# Patient Record
Sex: Male | Born: 1950 | Race: White | Hispanic: No | Marital: Married | Smoking: Former smoker
Health system: Southern US, Community
[De-identification: ages and names within clinical notes are randomized; demographics above are authoritative.]

## PROBLEM LIST (undated history)

## (undated) DIAGNOSIS — E1143 Type 2 diabetes mellitus with diabetic autonomic (poly)neuropathy: Secondary | ICD-10-CM

## (undated) DIAGNOSIS — I255 Ischemic cardiomyopathy: Secondary | ICD-10-CM

## (undated) DIAGNOSIS — I10 Essential (primary) hypertension: Secondary | ICD-10-CM

## (undated) DIAGNOSIS — J849 Interstitial pulmonary disease, unspecified: Secondary | ICD-10-CM

## (undated) DIAGNOSIS — IMO0002 Reserved for concepts with insufficient information to code with codable children: Secondary | ICD-10-CM

## (undated) DIAGNOSIS — K5790 Diverticulosis of intestine, part unspecified, without perforation or abscess without bleeding: Secondary | ICD-10-CM

## (undated) DIAGNOSIS — F17200 Nicotine dependence, unspecified, uncomplicated: Secondary | ICD-10-CM

## (undated) DIAGNOSIS — G459 Transient cerebral ischemic attack, unspecified: Secondary | ICD-10-CM

## (undated) DIAGNOSIS — Z91199 Patient's noncompliance with other medical treatment and regimen due to unspecified reason: Secondary | ICD-10-CM

## (undated) DIAGNOSIS — K219 Gastro-esophageal reflux disease without esophagitis: Secondary | ICD-10-CM

## (undated) DIAGNOSIS — J9611 Chronic respiratory failure with hypoxia: Secondary | ICD-10-CM

## (undated) DIAGNOSIS — E119 Type 2 diabetes mellitus without complications: Secondary | ICD-10-CM

## (undated) DIAGNOSIS — M4800 Spinal stenosis, site unspecified: Secondary | ICD-10-CM

## (undated) DIAGNOSIS — M1990 Unspecified osteoarthritis, unspecified site: Secondary | ICD-10-CM

## (undated) DIAGNOSIS — I5042 Chronic combined systolic (congestive) and diastolic (congestive) heart failure: Secondary | ICD-10-CM

## (undated) DIAGNOSIS — J449 Chronic obstructive pulmonary disease, unspecified: Secondary | ICD-10-CM

## (undated) DIAGNOSIS — I2510 Atherosclerotic heart disease of native coronary artery without angina pectoris: Secondary | ICD-10-CM

## (undated) DIAGNOSIS — E785 Hyperlipidemia, unspecified: Secondary | ICD-10-CM

## (undated) DIAGNOSIS — Z9119 Patient's noncompliance with other medical treatment and regimen: Secondary | ICD-10-CM

## (undated) DIAGNOSIS — C9210 Chronic myeloid leukemia, BCR/ABL-positive, not having achieved remission: Secondary | ICD-10-CM

## (undated) HISTORY — DX: Nicotine dependence, unspecified, uncomplicated: F17.200

## (undated) HISTORY — DX: Gastro-esophageal reflux disease without esophagitis: K21.9

## (undated) HISTORY — DX: Ischemic cardiomyopathy: I25.5

## (undated) HISTORY — PX: EYE SURGERY: SHX253

## (undated) HISTORY — DX: Type 2 diabetes mellitus with diabetic autonomic (poly)neuropathy: E11.43

## (undated) HISTORY — DX: Patient's noncompliance with other medical treatment and regimen: Z91.19

## (undated) HISTORY — PX: COLONOSCOPY: SHX174

## (undated) HISTORY — PX: CORONARY ANGIOPLASTY: SHX604

## (undated) HISTORY — DX: Hyperlipidemia, unspecified: E78.5

## (undated) HISTORY — PX: BRONCHOSCOPY: SUR163

## (undated) HISTORY — DX: Diverticulosis of intestine, part unspecified, without perforation or abscess without bleeding: K57.90

## (undated) HISTORY — PX: UPPER GI ENDOSCOPY: SHX6162

## (undated) HISTORY — DX: Chronic obstructive pulmonary disease, unspecified: J44.9

## (undated) HISTORY — DX: Essential (primary) hypertension: I10

## (undated) HISTORY — DX: Patient's noncompliance with other medical treatment and regimen due to unspecified reason: Z91.199

## (undated) HISTORY — DX: Reserved for concepts with insufficient information to code with codable children: IMO0002

## (undated) HISTORY — DX: Chronic myeloid leukemia, BCR/ABL-positive, not having achieved remission: C92.10

## (undated) HISTORY — PX: CORONARY ARTERY BYPASS GRAFT: SHX141

## (undated) HISTORY — DX: Atherosclerotic heart disease of native coronary artery without angina pectoris: I25.10

## (undated) HISTORY — DX: Transient cerebral ischemic attack, unspecified: G45.9

## (undated) HISTORY — DX: Chronic respiratory failure with hypoxia: J96.11

## (undated) HISTORY — PX: BACK SURGERY: SHX140

## (undated) HISTORY — DX: Interstitial pulmonary disease, unspecified: J84.9

---

## 1998-06-10 HISTORY — PX: CARDIAC CATHETERIZATION: SHX172

## 2000-08-10 DIAGNOSIS — K5790 Diverticulosis of intestine, part unspecified, without perforation or abscess without bleeding: Secondary | ICD-10-CM

## 2000-08-10 HISTORY — DX: Diverticulosis of intestine, part unspecified, without perforation or abscess without bleeding: K57.90

## 2002-08-10 HISTORY — PX: OTHER SURGICAL HISTORY: SHX169

## 2003-06-06 ENCOUNTER — Encounter: Payer: Self-pay | Admitting: Family Medicine

## 2003-06-15 ENCOUNTER — Encounter: Payer: Self-pay | Admitting: Family Medicine

## 2007-02-15 ENCOUNTER — Ambulatory Visit: Payer: Self-pay | Admitting: Cardiology

## 2007-02-18 ENCOUNTER — Ambulatory Visit: Payer: Self-pay

## 2007-03-04 ENCOUNTER — Ambulatory Visit: Payer: Self-pay | Admitting: Cardiology

## 2007-06-14 ENCOUNTER — Encounter: Payer: Self-pay | Admitting: Cardiology

## 2007-06-14 ENCOUNTER — Ambulatory Visit: Payer: Self-pay

## 2007-06-14 LAB — CONVERTED CEMR LAB
BUN: 12 mg/dL (ref 6–23)
Bilirubin, Direct: <0.1 mg/dL (ref 0.0–0.3)
CO2: 24 meq/L (ref 19–32)
Chloride: 106 meq/L (ref 96–112)
Creatinine, Ser: 0.97 mg/dL (ref 0.40–1.50)
Glucose, Bld: 102 mg/dL — ABNORMAL HIGH (ref 70–99)
LDL Cholesterol: 49 mg/dL (ref 0–99)
Potassium: 4.1 meq/L (ref 3.5–5.3)
Total Bilirubin: 0.3 mg/dL (ref 0.3–1.2)
Total Protein: 7.7 g/dL (ref 6.0–8.3)
Triglycerides: 243 mg/dL — ABNORMAL HIGH (ref <150)
VLDL: 49 mg/dL — ABNORMAL HIGH (ref 0–40)

## 2007-07-04 ENCOUNTER — Ambulatory Visit: Payer: Self-pay | Admitting: Gastroenterology

## 2008-12-19 DIAGNOSIS — E785 Hyperlipidemia, unspecified: Secondary | ICD-10-CM | POA: Insufficient documentation

## 2008-12-19 DIAGNOSIS — I10 Essential (primary) hypertension: Secondary | ICD-10-CM | POA: Insufficient documentation

## 2008-12-21 ENCOUNTER — Ambulatory Visit: Payer: Self-pay | Admitting: Cardiology

## 2008-12-21 ENCOUNTER — Encounter: Payer: Self-pay | Admitting: Cardiology

## 2008-12-21 LAB — CONVERTED CEMR LAB
AST: 27 units/L (ref 0–37)
Albumin: 4.5 g/dL (ref 3.5–5.2)
Alkaline Phosphatase: 41 units/L (ref 39–117)
Calcium: 9.5 mg/dL (ref 8.4–10.5)
Chloride: 104 meq/L (ref 96–112)
Glucose, Bld: 107 mg/dL — ABNORMAL HIGH (ref 70–99)
LDL Cholesterol: 62 mg/dL (ref 0–99)
Potassium: 4.2 meq/L (ref 3.5–5.3)
Sodium: 140 meq/L (ref 135–145)
Total Protein: 7.9 g/dL (ref 6.0–8.3)
Triglycerides: 169 mg/dL — ABNORMAL HIGH (ref <150)

## 2008-12-25 ENCOUNTER — Telehealth: Payer: Self-pay | Admitting: Cardiology

## 2008-12-27 ENCOUNTER — Encounter: Payer: Self-pay | Admitting: Cardiology

## 2008-12-27 ENCOUNTER — Ambulatory Visit: Payer: Self-pay

## 2009-04-17 ENCOUNTER — Ambulatory Visit: Payer: Self-pay | Admitting: Family Medicine

## 2009-04-17 DIAGNOSIS — R209 Unspecified disturbances of skin sensation: Secondary | ICD-10-CM | POA: Insufficient documentation

## 2009-04-17 DIAGNOSIS — Z8679 Personal history of other diseases of the circulatory system: Secondary | ICD-10-CM | POA: Insufficient documentation

## 2009-04-17 DIAGNOSIS — Z87891 Personal history of nicotine dependence: Secondary | ICD-10-CM | POA: Insufficient documentation

## 2009-04-18 LAB — CONVERTED CEMR LAB: Creatinine, Ser: 0.8 mg/dL (ref 0.4–1.5)

## 2009-04-21 ENCOUNTER — Encounter: Admission: RE | Admit: 2009-04-21 | Discharge: 2009-04-21 | Payer: Self-pay | Admitting: Family Medicine

## 2009-04-22 ENCOUNTER — Telehealth: Payer: Self-pay | Admitting: Family Medicine

## 2009-05-07 ENCOUNTER — Telehealth (INDEPENDENT_AMBULATORY_CARE_PROVIDER_SITE_OTHER): Payer: Self-pay | Admitting: *Deleted

## 2009-05-23 ENCOUNTER — Encounter: Payer: Self-pay | Admitting: Family Medicine

## 2009-10-24 ENCOUNTER — Ambulatory Visit: Payer: Self-pay | Admitting: Family Medicine

## 2009-11-18 ENCOUNTER — Ambulatory Visit: Payer: Self-pay | Admitting: Family Medicine

## 2009-11-18 DIAGNOSIS — R5383 Other fatigue: Secondary | ICD-10-CM

## 2009-11-18 DIAGNOSIS — R5381 Other malaise: Secondary | ICD-10-CM

## 2009-11-18 LAB — CONVERTED CEMR LAB
AST: 33 units/L (ref 0–37)
Alkaline Phosphatase: 45 units/L (ref 39–117)
Basophils Absolute: 0 10*3/uL (ref 0.0–0.1)
Basophils Relative: 1 % (ref 0.0–3.0)
GFR calc non Af Amer: 105.56 mL/min (ref >60)
Hemoglobin: 14.8 g/dL (ref 13.0–17.0)
Lymphocytes Relative: 46.8 % — ABNORMAL HIGH (ref 12.0–46.0)
Monocytes Relative: 11.9 % (ref 3.0–12.0)
Neutro Abs: 1.9 10*3/uL (ref 1.4–7.7)
Potassium: 3.8 meq/L (ref 3.5–5.1)
RBC: 4.78 M/uL (ref 4.22–5.81)
Sodium: 142 meq/L (ref 135–145)
Total Bilirubin: 0.3 mg/dL (ref 0.3–1.2)
Total CHOL/HDL Ratio: 4
VLDL: 68.8 mg/dL — ABNORMAL HIGH (ref 0.0–40.0)
WBC: 4.8 10*3/uL (ref 4.5–10.5)

## 2009-11-25 ENCOUNTER — Ambulatory Visit: Payer: Self-pay | Admitting: Family Medicine

## 2009-11-26 ENCOUNTER — Encounter: Payer: Self-pay | Admitting: Family Medicine

## 2009-11-26 LAB — CONVERTED CEMR LAB
Glucose, Bld: 166 mg/dL — ABNORMAL HIGH (ref 70–99)
Hgb A1c MFr Bld: 6.7 % — ABNORMAL HIGH (ref 4.6–6.5)

## 2010-01-02 ENCOUNTER — Ambulatory Visit: Payer: Self-pay | Admitting: Family Medicine

## 2010-01-02 DIAGNOSIS — E1159 Type 2 diabetes mellitus with other circulatory complications: Secondary | ICD-10-CM | POA: Insufficient documentation

## 2010-01-07 LAB — CONVERTED CEMR LAB: Vitamin B-12: 357 pg/mL (ref 211–911)

## 2010-03-26 ENCOUNTER — Encounter: Payer: Self-pay | Admitting: Family Medicine

## 2010-05-05 ENCOUNTER — Emergency Department: Payer: Self-pay | Admitting: Emergency Medicine

## 2010-07-16 ENCOUNTER — Encounter: Payer: Self-pay | Admitting: Family Medicine

## 2010-07-16 ENCOUNTER — Encounter (INDEPENDENT_AMBULATORY_CARE_PROVIDER_SITE_OTHER): Payer: Self-pay

## 2010-07-16 ENCOUNTER — Ambulatory Visit: Payer: Self-pay | Admitting: Family Medicine

## 2010-07-16 LAB — CONVERTED CEMR LAB: Hgb A1c MFr Bld: 6 %

## 2010-07-31 ENCOUNTER — Ambulatory Visit: Payer: Self-pay | Admitting: Family Medicine

## 2010-07-31 DIAGNOSIS — J309 Allergic rhinitis, unspecified: Secondary | ICD-10-CM | POA: Insufficient documentation

## 2010-09-10 NOTE — Assessment & Plan Note (Signed)
Summary: ROA FOR PAIN IN WRIST UP TO ELBOW   Vital Signs:  Patient profile:   60 year old male Height:      72 inches Weight:      215.6 pounds BMI:     29.35 Temp:     98.4 degrees F oral Pulse rate:   88 / minute Pulse rhythm:   regular BP sitting:   150 / 92  (left arm) Cuff size:   regular  Vitals Entered By: Benny Lennert CMA Duncan Dull) (October 24, 2009 11:50 AM)  History of Present Illness: Chief complaint right wrist hurting up to elbow also finger on the left hand bothering him  60 year old male:  HTN: BP elevated, h/o MI, not at goal.   DeQ: Right wrist hurts. Has not done anything new. Been hurting the last few weeks, has tried to wrap this as work. No trauma.   Left finger hurts some.    Right - DeQ  thumb spica  Allergies (verified): No Known Drug Allergies  Past History:  Past medical, surgical, family and social histories (including risk factors) reviewed, and no changes noted (except as noted below).  Past Medical History: Reviewed history from 04/17/2009 and no changes required. 1.  CAD:  Inferior MI 11/99 (in [place]) with PCI to [facility].  Last LHC 2004 [facility]): EF 50%, patent RCA stent.  No obstructive disease.  Last Myoview 2008: EF 42%, inferior infarct, no ischemia. 2.  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  3.  H/o TIAs, now s/p PFO closure 2004 (in [place]). 4.  HTN 5.  Hyperlipidemia 6.  Smoker 7. Diverticulitis, 2002 8. GERD 9.Ulcer 10. Headaches 11. Medical non-compliance  Past Surgical History: Reviewed history from 04/17/2009 and no changes required. Open heart surgery, PFO repair, 2004 Cath, PCI per PMH, 06/1998  Family History: Reviewed history from 04/17/2009 and no changes required. Family History of Coronary Artery Disease:  Family History Breast cancer 1st degree relative <50  Social History: Reviewed history from 12/21/2008 and no changes required. Married, 1 daughter, moved from [place].   Tobacco Use - Yes, 1ppd.    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  Lives in [place].   Review of Systems       REVIEW OF SYSTEMS  GEN: No systemic complaints, no fevers, chills, sweats, or other acute illnesses MSK: Detailed in the HPI GI: tolerating PO intake without difficulty Neuro: No numbness, parasthesias, or tingling associated. Otherwise the pertinent positives of the ROS are noted above.    Physical Exam  General:  GEN: Well-developed,well-nourished,in no acute distress; alert,appropriate and cooperative throughout examination HEENT: Normocephalic and atraumatic without obvious abnormalities. No apparent alopecia or balding. Ears, externally no deformities PULM: Breathing comfortably in no respiratory distress EXT: No clubbing, cyanosis, or edema PSYCH: Normally interactive. Cooperative during the interview. Pleasant. Friendly and conversant. Not anxious or depressed appearing. Normal, full affect.  Msk:  B hands ROM wrist/hand/digits/elbow: some B loss of motion at the wrist Carpals, MCP's, digits: NT Distal Ulna and Radius: NT Supination lift test: neg Ecchymosis or edema: SWOLLEN FIRST DORSAL COMPARTMENT Cysts/nodules: neg Finkelstein's test: POSITIVE ON THE RIGHT Snuffbox tenderness: neg Scaphoid tubercle: NT Hook of Hamate: NT Resisted supination: NT Full composite fist Grip, all digits: 5/5 str Axial load test: neg Tinel's: neg Atrophy: neg  Hand sensation: intact    Impression & Recommendations:  Problem # 1:  DE QUERVAIN'S TENOSYNOVITIS (ICD-727.04)  Assessment New >25 minutes spent in face to face time with patient, >50% spent in counselling or coordination of care: reviewed anatomy, hand rehab, grip str, rubber band resistance  place in thumb spica splint when using hand and at work  Problem # 2:  HYPERTENSION, UNSPECIFIED (ICD-401.9) Assessment: Deteriorated increase ARB  His updated medication list for this problem  includes:    Carvedilol 6.25 Mg Tabs (Carvedilol) .Marland Kitchen... 1 by mouth twice daily    Losartan Potassium 50 Mg Tabs (Losartan potassium) .Marland Kitchen... 1 by mouth daily  Complete Medication List: 1)  Lipitor 10 Mg Tabs (Atorvastatin calcium) .... Take one tablet by mouth daily. 2)  Aspirin 81 Mg Tbec (Aspirin) .... Take one tablet by mouth daily 3)  Fish Oil Oil (Fish oil) .Marland Kitchen.. 1 capsule daily 4)  Carvedilol 6.25 Mg Tabs (Carvedilol) .Marland Kitchen.. 1 by mouth twice daily 5)  Losartan Potassium 50 Mg Tabs (Losartan potassium) .Marland Kitchen.. 1 by mouth daily 6)  Bupropion Hcl 300 Mg Xr24h-tab (Bupropion hcl) .Marland Kitchen.. 1 by mouth daily 7)  Cialis 20 Mg Tabs (Tadalafil) .Marland Kitchen.. 1 by mouth 30 minutes prior to intercourse  Patient Instructions: 1)  Prephysical Labs, several days before, fasting 2)  BMP, HFP, FLP, CBC with diff, TSH: v77.91, v77.1, ,780.79 3)  CPX at his convenience Prescriptions: CIALIS 20 MG TABS (TADALAFIL) 1 by mouth 30 minutes prior to intercourse  #10 x 11   Entered and Authorized by:   Hannah Beat MD   Signed by:   Hannah Beat MD on 10/24/2009   Method used:   Print then Give to Patient   RxID:   [id number] JYNWGNFA POTASSIUM 50 MG TABS (LOSARTAN POTASSIUM) 1 by mouth daily  #30 x 11   Entered and Authorized by:   Hannah Beat MD   Signed by:   Hannah Beat MD on 10/24/2009   Method used:   Print then Give to Patient   RxID:   [id number]   Current Allergies (reviewed today): No known allergies

## 2010-09-10 NOTE — Miscellaneous (Signed)
  Clinical Lists Changes  Medications: Added new medication of GLUCOPHAGE XR 500 MG XR24H-TAB (METFORMIN HCL) take on by mouth daily - Signed Rx of GLUCOPHAGE XR 500 MG XR24H-TAB (METFORMIN HCL) take on by mouth daily;  #30 x 11;  Signed;  Entered by: Benny Lennert CMA (AAMA);  Authorized by: Hannah Beat MD;  Method used: [name] [address] [place], [place]  [address], Ph: [phone] or [phone], Fax: [phone]    Prescriptions: GLUCOPHAGE XR 500 MG XR24H-TAB (METFORMIN HCL) take on by mouth daily  #30 x 11   Entered by:   Benny Lennert CMA (AAMA)   Authorized by:   Hannah Beat MD   Signed by:   Benny Lennert CMA (AAMA) on 11/26/2009   Method used:   [facility] to        [name] ([facility]ail[facility]       [address]       [place], [place]  [address]       Ph: [phone] or [phone]       Fax: [phone]   RxID:   [id number]   Prior Medications: LIPITOR 10 MG TABS (ATORVASTATIN CALCIUM) Take one tablet by mouth daily. ASPIRIN 81 MG TBEC (ASPIRIN) Take one tablet by mouth daily FISH OIL   OIL (FISH OIL) 1 capsule daily LOSARTAN POTASSIUM 50 MG TABS (LOSARTAN POTASSIUM) 1 by mouth daily BUPROPION HCL 300 MG XR24H-TAB (BUPROPION HCL) 1 by mouth daily CIALIS 20 MG TABS (TADALAFIL) 1 by mouth 30 minutes prior to intercourse Current Allergies: No known allergies

## 2010-09-10 NOTE — Letter (Signed)
Summary: Supplies   Supplies   Imported By: Dorna Leitz 07/17/2010 15:32:56  _____________________________________________________________________  External Attachment:    Type:   Image     Comment:   External Document

## 2010-09-10 NOTE — Assessment & Plan Note (Signed)
Summary: POSSIBLE BRONCHITIS AND CONGESTION IN CHEST/EVM   Vital Signs:  Patient Profile:   60 Years Old Male CC:      Cold & URI symptoms Height:     71.25 inches Weight:      197 pounds BMI:     27.38 O2 Sat:      92 % O2 treatment:    Room Air Temp:     97.7 degrees F oral Pulse rate:   92 / minute Pulse rhythm:   regular Resp:     20 per minute BP sitting:   159 / 85  (right arm)  Pt. in pain?   no  Vitals Entered By: Levonne Spiller EMT-P (July 16, 2010 3:44 PM)              Is Patient Diabetic? Yes   Does patient need assistance? Functional Status Self care Ambulation Normal Comments Pt. is a smoker. 1 pack per day.     Serial Vital Signs/Assessments:                                PEF    PreRx  PostRx Time      O2 Sat  O2 Type     L/min  L/min  L/min   By 1605      96  %   Room air                          Clanford Johnson MD   Current Allergies: No known allergies History of Present Illness History from: patient Chief Complaint: Cold & URI symptoms History of Present Illness: Pt came in today for evaluation of cough and wheezing and congestion that has gotten worse over the past 2 days.  He came in because he was having fatigue and SOB and not resting well at night because of coughing.  He has been feeling really bad for the last several weeks according to his wife.  He is a smoker and has been smoking for 40 years.  He has tried to quit multiple times but ended up starting back.  He has been weak.  No chest pain reported.  He has been wheezing and having tight lungs that have been hard to hold any air.  He has not been vomiting.  He did get his flu vaccine and does report that he has had his pneumovax in the last 5 years.  In addition, his wife reports that he has had severe COPD exacerbations in the past.  They used to live in [place].  They now live in [place].   The patient does not have a home nebulizer machine.    REVIEW OF SYSTEMS Constitutional Symptoms   Complains of fatigue.     Denies fever, chills, night sweats, weight loss, and weight gain.  Eyes       Complains of glasses.      Denies change in vision, eye pain, eye discharge, contact lenses, and eye surgery. Ear/Nose/Throat/Mouth       Complains of frequent runny nose, sinus problems, sore throat, and hoarseness.      Denies hearing loss/aids, change in hearing, ear pain, ear discharge, dizziness, frequent nose bleeds, and tooth pain or bleeding.  Respiratory       Complains of productive cough, wheezing, shortness of breath, and bronchitis.      Denies dry cough, asthma, and emphysema/COPD.  Comments: Colored Sputum Cardiovascular       Complains of tires easily with exhertion.      Denies murmurs and chest pain.    Gastrointestinal       Denies stomach pain, nausea/vomiting, diarrhea, constipation, blood in bowel movements, and indigestion. Genitourniary       Denies painful urination, kidney stones, and loss of urinary control. Neurological       Denies paralysis, seizures, and fainting/blackouts. Musculoskeletal       Denies muscle pain, joint pain, joint stiffness, decreased range of motion, redness, swelling, muscle weakness, and gout.  Skin       Denies bruising, unusual mles/lumps or sores, and hair/skin or nail changes.  Psych       Denies mood changes, temper/anger issues, anxiety/stress, speech problems, depression, and sleep problems.  Past History:  Past Medical History: 1.  CAD:  Inferior MI 11/99 (in [place]) with PCI to [facility].  Last LHC 2004 [facility]): EF 50%, patent RCA stent.  No obstructive disease.  Last Myoview 2008: EF 42%, inferior infarct, no ischemia. 2.  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  3.  H/o TIAs, now s/p PFO closure 2004 (in [place]). diabetes mellitus, type 2 - A1c 6.0% 07/2010 4.  HTN 5.  Hyperlipidemia 6.  Smoker 7. Diverticulitis, 2002 8. GERD 9.Ulcer 10. Headaches 11. Medical non-compliance 12. Chronic Active Nicotine  Dependence 13. COPD  Social History: Married, 1 daughter, relocated to [place] from [place].   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  Lives in [place], [place].  Physical Exam General appearance: well developed, well nourished, no acute distress Head: normocephalic, atraumatic Eyes: conjunctivae and lids normal Pupils: equal, round, reactive to light Ears: normal, no lesions or deformities Nasal: swollen red turbinates with congestion Oral/Pharynx: pharyngeal erythema without exudate, uvula midline without deviation Neck: neck supple,  trachea midline, no masses Chest/Lungs: scattered wheezes throughout all lobes, poor air movement, shallow breaths, no accessory muscle use seen Heart: regular rate and  rhythm, no murmur Abdomen: soft, non-tender without obvious organomegaly Extremities: normal extremities Neurological: grossly intact and non-focal Skin: no obvious rashes or lesions MSE: oriented to time, place, and person Assessment Problems:   DIABETES MELLITUS, TYPE II (ICD-250.00) HEALTH MAINTENANCE EXAM (ICD-V70.0) SPECIAL SCREENING MALIGNANT NEOPLASM OF PROSTATE (ICD-V76.44) OTHER MALAISE AND FATIGUE (ICD-780.79) TOBACCO USE (ICD-305.1) SPECIAL SCREENING MALIGNANT NEOPLASM OF PROSTATE (ICD-V76.44) TRANSIENT ISCHEMIC ATTACKS, HX OF (ICD-V12.50) PARESTHESIA (ICD-782.0) FAMILY HISTORY BREAST CANCER 1ST DEGREE RELATIVE <50 (ICD-V16.3) HYPERTENSION, UNSPECIFIED (ICD-401.9) HYPERLIPIDEMIA-MIXED (ICD-272.4) CAD, UNSPECIFIED SITE (ICD-414.00)  Assessed TOBACCO USE as unchanged - Clanford Johnson MD Assessed DIABETES MELLITUS, TYPE II as improved - Clanford Johnson MD Assessed OTHER MALAISE AND FATIGUE as deteriorated - Clanford Johnson MD Assessed OBSTRUCTIVE CHRONIC BRONCHITIS WITH EXACERBATION as new - Clanford Johnson MD Assessed CAD, UNSPECIFIED SITE as unchanged - Clanford Johnson MD New  Problems: OBSTRUCTIVE CHRONIC BRONCHITIS WITH EXACERBATION (ICD-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 The risks, benefits and possible side effects were clearly explained and discussed with the patient.  The patient verbalized clear understanding.  The patient was given instructions to return if symptoms don't improve, worsen or new changes develop.  If it is not during clinic hours and the patient cannot get back to this clinic then the patient was told to seek medical care at an available urgent  care or emergency department.  The patient verbalized understanding.   Demonstrates willingness to comply.  Plan New Medications/Changes: IPRATROPIUM-ALBUTEROL 0.5-2.5 (3) MG/3ML SOLN (IPRATROPIUM-ALBUTEROL) use 1 vial every 4 hours as needed shortness of breath and wheezing and cough  #20 x 0, 07/16/2010, Clanford Johnson MD PREDNISONE 10 MG TABS (PREDNISONE) take 3 tabs by mouth on day 1 and 3 tabs by mouth on day 2, then 2 tabs by mouth daily for 3 days  #12 x 0, 07/16/2010, Clanford Johnson MD VENTOLIN HFA 108 (90 BASE) MCG/ACT AERS (ALBUTEROL SULFATE) 2 puffs every 3 hours as needed shortness of breath and wheezing  #1 x 1, 07/16/2010, Clanford Johnson MD DOXYCYCLINE HYCLATE 100 MG TABS (DOXYCYCLINE HYCLATE) take 1 by mouth two times a day with food  #20 x 0, 07/16/2010, [name] MD  Planning Comments:   The patient was counseled and advised to stop using all tobacco products.  Medical assistance was offered and the patient was encouraged to call 1-800-QUIT-NOW to get a smoking cessation coach.    Follow Up: Follow up in 1-2 days if no improvement, Follow up on an as needed basis, Follow up with Primary Physician  The patient and/or caregiver has been counseled thoroughly with regard to medications prescribed including dosage, schedule, interactions, rationale for use, and possible side effects and they verbalize  understanding.  Diagnoses and expected course of recovery discussed and will return if not improved as expected or if the condition worsens. Patient and/or caregiver verbalized understanding.  Prescriptions: IPRATROPIUM-ALBUTEROL 0.5-2.5 (3) MG/3ML SOLN (IPRATROPIUM-ALBUTEROL) use 1 vial every 4 hours as needed shortness of breath and wheezing and cough  #20 x 0   Entered and Authorized by:   [name] MD   Signed by:   [name] MD on 07/16/2010   Method used:   [name]       [address]       [place], [place]  [address]       Ph: [phone] or [phone]       Fax: [phone]   RxID:   [id number] PREDNISONE 10 MG TABS (PREDNISONE) take 3 tabs by mouth on day 1 and 3 tabs by mouth on day 2, then 2 tabs by mouth daily for 3 days  #12 x 0   Entered and Authorized by:   [name] MD   Signed by:   [name] MD on 07/16/2010   Method used:   [facility]        [name]       [address]       [place], [place]  [address]       Ph: [phone] or [phone]       Fax: [phone]   RxID:   [id number] VENTOLIN HFA 108 (90 BASE) MCG/ACT AERS (ALBUTEROL SULFATE) 2 puffs every 3 hours as needed shortness of breath and wheezing  #1 x 1   Entered and Authorized by:   [name] MD   Signed by:   [name] MD on 07/16/2010   Method used:   [facility]        [name]       [name]  [place], [place]  [address]       Ph: [phone] or [phone]       Fax: [phone]   RxID:   [id number] DOXYCYCLINE HYCLATE 100 MG TABS (DOXYCYCLINE HYCLATE) take 1 by mouth two times a day with food  #20 x 0   Entered and Authorized by:   [name] MD   Signed by:   [name] MD on 07/16/2010   Method used:   [facility]        [name]       [address]       [place], [place]  [address]       Ph: [phone] or [phone]       Fax: [phone]   RxID:   [id number]   Patient Instructions: 1)  Go to the pharmacy to pick up your prescriptions.  2)  Tobacco is very bad for your health and your loved ones! You Should stop smoking!. 3)  Stop Smoking Tips: Choose a Quit date. Cut down before the Quit date. decide what you will do as a substitute when you feel the urge to smoke(gum,toothpick,exercise). 4)  Take your antibiotic as prescribed until ALL of it is gone, but stop if you develop a rash or swelling and contact our office as soon as possible. 5)  Acute bronchitis symptoms for less than 10 days are not helped by antibiotics. take over the counter cough medications. call if no improvment in  5-7 days, sooner if increasing cough, fever, or new symptoms( shortness of breath, chest pain). 6)  The patient was informed that there is no on-call provider or services available at this clinic during off-hours (when the clinic is closed).  If the patient developed a problem or concern that required immediate attention, the patient was advised to go the the nearest available urgent care or emergency department for medical care.  The patient verbalized understanding.    7)  Check your blood sugars more frequently because you are now on steroids and expect that your blood sugars will be higher while on the steroids.  Please call  us and return if your blood sugars are higher than 250.   I ended up calling the prescriptions in to the [facility] pharmacy because the patient requested to get them here. [facility] called [facility] to let them know to cancel those prescriptions sent to their facility.  I spent more than 65 mins with patient.  I wanted to make sure that his respiratory status was stable and improving.  He received 2 nebulizer treatments with duoneb.  Also, I called [facility] and  arranged for the patient to receive a home nebulizer machine and medications to take a nebulizer treatment as needed for severe wheezing and SOB.   The patient was counseled and advised to stop using all tobacco products.  Medical assistance was offered and the patient was encouraged to call 1-800-QUIT-NOW to get a smoking cessation coach.    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)    Route: IM    Site: RUOQ gluteus    Exp Date: 09/09/2012    Lot #: [id number] Harman    Mfr: [email or web address]    Patient tolerated injection without complications  Given by: Levonne Spiller EMT-P (July 16, 2010 5:06 PM)  Medication # 1:    Medication: Albuterol Sulfate 3.0mg     Diagnosis: OBSTRUCTIVE CHRONIC BRONCHITIS WITH EXACERBATION (ICD-491.21)    Dose: 2    Route: Nebulizer    Exp Date: 12/08/2010    Lot #: [id number]    Mfr: SANDOZ    Patient tolerated medication without complications    Given by: Levonne Spiller EMT-P (July 16, 2010 5:10 PM)  Medication # 2:    Medication: Ipratropium Bromide 0.5mg     Diagnosis: OBSTRUCTIVE CHRONIC BRONCHITIS WITH EXACERBATION (ICD-491.21)    Dose: 2    Route: Nebulizer    Exp Date: 12/08/2010    Lot #: [id number]    Mfr: [email or web address]    Patient tolerated medication without complications    Given by: Levonne Spiller EMT-P (July 16, 2010 5:11 PM)    Lab Results    Glu:     117  mg/dL     Hgb V4U:     6.0   %

## 2010-09-10 NOTE — Letter (Signed)
Summary: Generic Letter  [facility]  [address]   [place], [place] [address]   Phone: [phone]  Fax: [phone]    07/16/2010  Tony Watson [address] [place], [place] [address]   Nebulizer treatment kit with all supplies. Diagnosis COPD 496, 491.21, 305.1          Sincerely,   Dr. [name], MD

## 2010-09-10 NOTE — Assessment & Plan Note (Signed)
Summary: F/U DIABETES/CLE   Vital Signs:  Patient profile:   60 year old male Height:      72 inches Weight:      207.0 pounds BMI:     28.18 Temp:     98.0 degrees F oral Pulse rate:   88 / minute Pulse rhythm:   regular BP sitting:   120 / 60  (left arm) Cuff size:   regular  Vitals Entered By: Benny Lennert CMA Duncan Dull) (Jan 02, 2010 11:46 AM)  History of Present Illness: Chief complaint follow up diabetes  DM: new onset: a1c = 6.7 the patient is generally not been feeling bad at all, however he has had elevated sugars. He is tolerating his metformin, one tablet of the extended release daily. He is here with his wife, and they have been reading about diabetes.  HTN, well-controlled, compliant. Status post MI  Lipids, compliant, status post MI  pneumovax - had a pneumoniavaccine      Diabetes Management History:      The patient is a 60 years old male who comes in for evaluation of DM Type 2.  He has not been enrolled in the "Diabetic Education Program".  He states lack of understanding of dietary principles and is not following his diet appropriately.  He says that he is not exercising regularly.        Hypoglycemic symptoms are not occurring.  No hyperglycemic symptoms are reported.        Symptoms which suggest diabetic complications include sexual dysfunction and paresthesias.  The following changes have been made to his treatment plan since last visit: diet changes and medication changes.  Treatment plan changes were initiated by patient and initiated by MD.    Current Problems (verified): 1)  Diabetes Mellitus, Type II  (ICD-250.00) 2)  Health Maintenance Exam  (ICD-V70.0) 3)  Special Screening Malignant Neoplasm of Prostate  (ICD-V76.44) 4)  Other Malaise and Fatigue  (ICD-780.79) 5)  Tobacco Use  (ICD-305.1) 6)  Special Screening Malignant Neoplasm of Prostate  (ICD-V76.44) 7)  Transient Ischemic Attacks, Hx of  (ICD-V12.50) 8)  Paresthesia  (ICD-782.0) 9)   Family History Breast Cancer 1st Degree Relative <50  (ICD-V16.3) 10)  Hypertension, Unspecified  (ICD-401.9) 11)  Hyperlipidemia-mixed  (ICD-272.4) 12)  Cad, Unspecified Site  (ICD-414.00)  Allergies (verified): No Known Drug Allergies  Past History:  Past medical, surgical, family and social histories (including risk factors) reviewed, and no changes noted (except as noted below).  Past Medical History: 1.  CAD:  Inferior MI 11/99 (in [place]) with PCI to [facility].  Last LHC 2004 [facility]): EF 50%, patent RCA stent.  No obstructive disease.  Last Myoview 2008: EF 42%, inferior infarct, no ischemia. 2.  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  3.  H/o TIAs, now s/p PFO closure 2004 (in [place]). diabetes mellitus, type II 4.  HTN 5.  Hyperlipidemia 6.  Smoker 7. Diverticulitis, 2002 8. GERD 9.Ulcer 10. Headaches 11. Medical non-compliance  Past Surgical History: Reviewed history from 04/17/2009 and no changes required. Open heart surgery, PFO repair, 2004 Cath, PCI per PMH, 06/1998  Family History: Reviewed history from 04/17/2009 and no changes required. Family History of Coronary Artery Disease:  Family History Breast cancer 1st degree relative <50  Social History: Reviewed history from 12/21/2008 and no changes required. Married, 1 daughter, moved from [place].   Tobacco Use - Yes, 1ppd.  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  Lives in [place].   Review of Systems       no fever, chills, sweats. No nausea, vomiting. No blurred vision. He does still have some complaints of some occasional paresthesias in multiple regions.  Neurology notes reviewed  Physical Exam  General:  GEN: WDWN, NAD, Non-toxic, A & O x 3 HEENT: Atraumatic, Normocephalic. Neck supple. No masses, No LAD. Ears and Nose: No external deformity. CV: RRR, No M/G/R. No JVD. No thrill. No extra heart sounds. PULM: CTA B, no wheezes, crackles, rhonchi. No  retractions. No resp. distress. No accessory muscle use. ABD: S, NT, ND, +BS. No rebound tenderness. No HSM.  EXTR: No c/c/e NEURO: Normal gait.  PSYCH: Normally interactive. Conversant. Not depressed or anxious appearing.  Calm demeanor.   Msk:  Phalen's and Tinel's signs are negative bilateral wrists and   Impression & Recommendations:  Problem # 1:  DIABETES MELLITUS, TYPE II (ICD-250.00) Assessment New diabetic education, continue his Glucophage  His updated medication list for this problem includes:    Aspirin 81 Mg Tbec (Aspirin) .Marland Kitchen... Take one tablet by mouth daily    Losartan Potassium 50 Mg Tabs (Losartan potassium) .Marland Kitchen... 1 by mouth daily    Glucophage Xr 500 Mg Xr24h-tab (Metformin hcl) .Marland Kitchen... Take on by mouth daily  Orders: Diabetic Clinic Referral (Diabetic)  Labs Reviewed: Creat: 0.8 (11/18/2009)    Reviewed HgBA1c results: 6.7 (11/25/2009)  6.2 (06/14/2007)  Problem # 2:  HYPERTENSION, UNSPECIFIED (ICD-401.9) Assessment: Improved  His updated medication list for this problem includes:    Carvedilol 12.5 Mg Tabs (Carvedilol) .Marland Kitchen... 1 pill by mouth 2 times daily    Losartan Potassium 50 Mg Tabs (Losartan potassium) .Marland Kitchen... 1 by mouth daily  Problem # 3:  HYPERLIPIDEMIA-MIXED (ICD-272.4) Assessment: Improved  His updated medication list for this problem includes:    Lipitor 10 Mg Tabs (Atorvastatin calcium) .Marland Kitchen... Take one tablet by mouth daily.  Labs Reviewed: SGOT: 33 (11/18/2009)   SGPT: 40 (11/18/2009)   HDL:26.70 (11/18/2009), 28 (12/21/2008)  LDL:62 (12/21/2008), 49 (06/14/2007)  Chol:114 (11/18/2009), 124 (12/21/2008)  Trig:344.0 (11/18/2009), 169 (12/21/2008)  Complete Medication List: 1)  Lipitor 10 Mg Tabs (Atorvastatin calcium) .... Take one tablet by mouth daily. 2)  Aspirin 81 Mg Tbec (Aspirin) .... Take one tablet by mouth daily 3)  Fish Oil Oil (Fish oil) .Marland Kitchen.. 1 capsule daily 4)  Carvedilol 12.5 Mg Tabs (Carvedilol) .Marland Kitchen.. 1 pill by mouth 2 times  daily 5)  Losartan Potassium 50 Mg Tabs (Losartan potassium) .Marland Kitchen.. 1 by mouth daily 6)  Bupropion Hcl 300 Mg Xr24h-tab (Bupropion hcl) .Marland Kitchen.. 1 by mouth daily 7)  Cialis 20 Mg Tabs (Tadalafil) .Marland Kitchen.. 1 by mouth 30 minutes prior to intercourse 8)  Glucophage Xr 500 Mg Xr24h-tab (Metformin hcl) .... Take on by mouth daily 9)  Glucometer Lancets  .... Check two times a day as directed (250.00) 10)  Glucometer Strips  .... Check bs two times a day (250.00)  Other Orders: Venipuncture (28413) T- * Misc. Laboratory test [phone]) TLB-B12, Serum-Total ONLY [phone])  Diabetes Management Assessment/Plan:      The following lipid goals have been established for the patient: Total cholesterol goal of 200; LDL cholesterol goal of 100; HDL cholesterol goal of 40; Triglyceride goal of 200.    Patient Instructions: 1)  Referral Appointment Information 2)  Day/Date: 3)  Time: 4)  Place/MD: 5)  Address: 6)  Phone/Fax: 7)  Patient given appointment information. Information/Orders faxed/mailed.  8)  f/u 3 months Prescriptions: GLUCOMETER LANCETS Check two times a day as directed (250.00)  #100 x 11   Entered and Authorized by:   Hannah Beat MD   Signed by:   Hannah Beat MD on 01/02/2010   Method used:   Print then Give to Patient   RxID:   [id number] GLUCOMETER STRIPS Check BS two times a day (250.00)  #100 x 11   Entered and Authorized by:   Hannah Beat MD   Signed by:   Hannah Beat MD on 01/02/2010   Method used:   Print then Give to Patient   RxID:   [id number] GLUCOMETER LANCETS Check two times a day as directed (250.00)  #100 x 0   Entered and Authorized by:   Hannah Beat MD   Signed by:   Hannah Beat MD on 01/02/2010   Method used:   Print then Give to Patient   RxID:   [id number]   Current Allergies (reviewed today): No known allergies

## 2010-09-10 NOTE — Letter (Signed)
Summary: Unable to Contact Patient/[facility]  Unable to Contact Patient/[facility]   Imported By: Lanelle Bal 04/03/2010 08:19:47  _____________________________________________________________________  External Attachment:    Type:   Image     Comment:   External Document

## 2010-09-10 NOTE — Assessment & Plan Note (Signed)
Summary: CPX/RBH   Vital Signs:  Patient profile:   60 year old male Height:      72 inches Weight:      213.0 pounds BMI:     28.99 Temp:     98.4 degrees F oral Pulse rate:   88 / minute Pulse rhythm:   regular BP sitting:   160 / 70  (left arm) Cuff size:   regular  Vitals Entered By: Benny Lennert CMA Duncan Dull) (November 25, 2009 2:36 PM)  Serial Vital Signs/Assessments:  Time      Position  BP       Pulse  Resp  Temp     By 2:52 PM             152/75                         Hannah Beat MD   History of Present Illness: Chief complaint cpx  HTN: still high compliant with medications Elevated on last OV and increased ARB No SE  Lipids: Total and LDL at goal, still high Trigs. Have been > 1500 in the past  cut way back on cig, was d/c, then restarted few cigars  CAD, no CP. Compliant with medications now per report.  Hyperglycemia: FBS 126 on labs, no history of DM, no symptoms   Preventive Screening-Counseling & Management  Alcohol-Tobacco     Alcohol drinks/day: <1     Alcohol Counseling: not indicated; use of alcohol is not excessive or problematic     Smoking Status: current     Smoking Cessation Counseling: yes     Smoke Cessation Stage: relapse     Tobacco Counseling: to quit use of tobacco products  Caffeine-Diet-Exercise     Diet Counseling: to improve diet; diet is suboptimal     Does Patient Exercise: no     Exercise Counseling: to improve exercise regimen  Hep-HIV-STD-Contraception     STD Risk: no risk noted     Dental Visit-last 6 months yes     Testicular SE Education/Counseling to perform regular STE      Sexual History:  currently monogamous.    Allergies (verified): No Known Drug Allergies  Past History:  Past medical, surgical, family and social histories (including risk factors) reviewed, and no changes noted (except as noted below).  Past Medical History: Reviewed history from 04/17/2009 and no changes required. 1.  CAD:   Inferior MI 11/99 (in [place]) with PCI to [facility].  Last LHC 2004 [facility]): EF 50%, patent RCA stent.  No obstructive disease.  Last Myoview 2008: EF 42%, inferior infarct, no ischemia. 2.  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  3.  H/o TIAs, now s/p PFO closure 2004 (in [place]). 4.  HTN 5.  Hyperlipidemia 6.  Smoker 7. Diverticulitis, 2002 8. GERD 9.Ulcer 10. Headaches 11. Medical non-compliance  Past Surgical History: Reviewed history from 04/17/2009 and no changes required. Open heart surgery, PFO repair, 2004 Cath, PCI per PMH, 06/1998  Family History: Reviewed history from 04/17/2009 and no changes required. Family History of Coronary Artery Disease:  Family History Breast cancer 1st degree relative <50  Social History: Reviewed history from 12/21/2008 and no changes required. Married, 1 daughter, moved from [place].   Tobacco Use - Yes, 1ppd.  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  Lives in [place].  Does Patient Exercise:  no STD  Risk:  no risk noted Dental Care w/in 6 mos.:  yes Sexual History:  currently monogamous  Review of Systems  General: Denies fever, chills, sweats, anorexia, fatigue, weakness, malaise Eyes: Denies blurring, vision loss ENT: Denies earache, nasal congestion, nosebleeds, sore throat, and hoarseness.  Cardiovascular: Denies chest pains, palpitations, syncope, dyspnea on exertion,  Respiratory: Denies cough, occ SOB WITH CLIMBING UP AND DOWN LADDERS GI: Denies nausea, vomiting, diarrhea, constipation, change in bowel habits, abdominal pain, melena, hematochezia GU: Denies dysuria, hematuria, discharge, urinary frequency, urinary hesitancy, nocturia, incontinence, genital sores, decreased libido Musculoskeletal: Denies back pain, joint pain Derm: Denies rash, itching Neuro: Denies  paresthesias, frequent falls, frequent headaches, and difficulty walking.  Psych: Denies depression,  anxiety Endocrine: Denies cold intolerance, heat intolerance, polydipsia, polyphagia, polyuria, and unusual weight change.  Heme: Denies enlarged lymph nodes Allergy: No hayfever   Otherwise, the pertinent positives and negatives are listed above and in the HPI, otherwise a full review of systems has been reviewed and is negative unless noted positive.    Impression & Recommendations:  Problem # 1:  HEALTH MAINTENANCE EXAM (ICD-V70.0) The patient's preventative maintenance and recommended screening tests for an annual wellness exam were reviewed in full today. Brought up to date unless services declined.  Counselled on the importance of diet, exercise, and its role in overall health and mortality. The patient's FH and SH was reviewed, including their home life, tobacco status, and drug and alcohol status.   Problem # 2:  HYPERGLYCEMIA (ICD-790.29) Assessment: New recheck and check A1c  Orders: Venipuncture (04540) TLB-Glucose, QUANT (82947-GLU) TLB-A1C / Hgb A1C (Glycohemoglobin) (83036-A1C)  Problem # 3:  HYPERTENSION, UNSPECIFIED (ICD-401.9) Assessment: Deteriorated increase coreg  His updated medication list for this problem includes:    Carvedilol 12.5 Mg Tabs (Carvedilol) .Marland Kitchen... 1 pill by mouth 2 times daily    Losartan Potassium 50 Mg Tabs (Losartan potassium) .Marland Kitchen... 1 by mouth daily  BP today: 160/70 Prior BP: 150/92 (10/24/2009)  Labs Reviewed: K+: 3.8 (11/18/2009) Creat: : 0.8 (11/18/2009)   Chol: 114 (11/18/2009)   HDL: 26.70 (11/18/2009)   LDL: 62 (12/21/2008)   TG: 344.0 (11/18/2009)  Problem # 4:  HYPERLIPIDEMIA-MIXED (ICD-272.4)  His updated medication list for this problem includes:    Lipitor 10 Mg Tabs (Atorvastatin calcium) .Marland Kitchen... Take one tablet by mouth daily.  Labs Reviewed: SGOT: 33 (11/18/2009)   SGPT: 40 (11/18/2009)   HDL:26.70 (11/18/2009), 28 (12/21/2008)  LDL:62 (12/21/2008), 49 (06/14/2007)  Chol:114 (11/18/2009), 124 (12/21/2008)  Trig:344.0  (11/18/2009), 169 (12/21/2008)  Problem # 5:  CAD, UNSPECIFIED SITE (ICD-414.00)  His updated medication list for this problem includes:    Aspirin 81 Mg Tbec (Aspirin) .Marland Kitchen... Take one tablet by mouth daily    Carvedilol 12.5 Mg Tabs (Carvedilol) .Marland Kitchen... 1 pill by mouth 2 times daily    Losartan Potassium 50 Mg Tabs (Losartan potassium) .Marland Kitchen... 1 by mouth daily  Labs Reviewed: Chol: 114 (11/18/2009)   HDL: 26.70 (11/18/2009)   LDL: 62 (12/21/2008)   TG: 344.0 (11/18/2009)  Problem # 6:  TOBACCO USE (ICD-305.1) The patient does smoke cigarettes, and we discussed that tobacco is harmful to one's overall health, and that likely quitting smoking would be the single best thing that they could do for their health.   Complete Medication List: 1)  Lipitor 10 Mg Tabs (Atorvastatin calcium) .... Take one tablet by mouth daily. 2)  Aspirin 81 Mg Tbec (Aspirin) .... Take one tablet by mouth daily 3)  Fish Oil Oil (Fish oil) .Marland KitchenMarland KitchenMarland Kitchen  1 capsule daily 4)  Carvedilol 12.5 Mg Tabs (Carvedilol) .Marland Kitchen.. 1 pill by mouth 2 times daily 5)  Losartan Potassium 50 Mg Tabs (Losartan potassium) .Marland Kitchen.. 1 by mouth daily 6)  Bupropion Hcl 300 Mg Xr24h-tab (Bupropion hcl) .Marland Kitchen.. 1 by mouth daily 7)  Cialis 20 Mg Tabs (Tadalafil) .Marland Kitchen.. 1 by mouth 30 minutes prior to intercourse  Other Orders: TLB-PSA (Prostate Specific Antigen) (84153-PSA) Prescriptions: LIPITOR 10 MG TABS (ATORVASTATIN CALCIUM) Take one tablet by mouth daily.  #30 x 11   Entered and Authorized by:   Hannah Beat MD   Signed by:   Hannah Beat MD on 11/25/2009   Method used:   Electron[facility] to        [facility] [id number] (retail)       [address]       [place], [place]  [address]       Ph: [phone] or [phone]       Fax: [phone]   RxID:   [id number] CARVEDILOL 12.5 MG  TABS (CARVEDILOL) 1 pill by mouth 2 times daily  #60 x 3   Entered and Authorized by:   Hannah Beat MD   Signed by:   Hannah Beat MD on 11/25/2009    Method used:   [facility]        [facility] (retail)       [address]       [place], [place]  [address]       Ph: [phone] or [phone]       Fax: [phone]   RxID:   [id number] BUPROPION HCL 300 MG XR24H-TAB (BUPROPION HCL) 1 by mouth daily  #30 x 6   Entered by:   Benny Lennert CMA (AAMA)   Authorized by:   Hannah Beat MD   Signed by:   Benny Lennert CMA (AAMA) on 11/25/2009   Method used:   Electronically to        [facility] [address] [phone]* (retail)       [address] [place], [place]  [address]       Ph: [phone]       Fax: [phone]   RxID:   [id number]   Current Allergies (reviewed today): No known allergies  Physical Exam General Appearance: well developed, well nourished, no acute distress Eyes: conjunctiva and lids normal, PERRLA, EOMI Ears, Nose, Mouth, Throat: TM clear, nares clear, oral exam WNL Neck: supple, no lymphadenopathy, no thyromegaly, no JVD Respiratory: clear to auscultation and percussion, respiratory effort normal Cardiovascular: regular rate and rhythm, S1-S2, no murmur, rub or gallop, no bruits, peripheral pulses normal and symmetric, no cyanosis, clubbing, edema or varicosities Chest: no scars, masses, tenderness; no asymmetry, skin changes, nipple discharge, no gynecomastia   Gastrointestinal: soft, non-tender; no hepatosplenomegaly, masses; active bowel sounds all quadrants,no masses, tenderness, hemorrhoids  Genitourinary: no hernia, testicular mass, penile discharge, priapism or prostate enlargement Lymphatic: no cervical, axillary or inguinal adenopathy Musculoskeletal: gait normal, muscle tone and strength WNL, no joint swelling, effusions, discoloration, crepitus  Skin: clear, good turgor, color WNL, no rashes, lesions, or ulcerations Neurologic: normal mental status, normal reflexes, normal strength, sensation, and motion Psychiatric: alert; oriented to person, place and  time Other Exam:

## 2010-09-11 NOTE — Assessment & Plan Note (Signed)
Summary: CANT BREATH VERY WELL,COUGH/JBB   Vital Signs:  Patient profile:   60 year old male Height:      71.25 inches Weight:      197 pounds BMI:     27.38 O2 Sat:      95 % on Room air Temp:     97.9 degrees F oral Pulse rate:   92 / minute Pulse rhythm:   regular Resp:     20 per minute BP sitting:   140 / 86  (right arm)  Vitals Entered By: Levonne Spiller EMT-P (July 31, 2010 2:28 PM)  O2 Flow:  Room air CC: S.O.B. and Cough Is Patient Diabetic? Yes Did you bring your meter with you today? No Pain Assessment Patient in pain? no       Does patient need assistance? Functional Status Self care Ambulation Normal   Chief Complaint:  S.O.B. and Cough.  History of Present Illness: Pt reports that he is having cough, congestion, wheezing and SOB.  Pt says that he initially improved but then his symptoms returned and got worse.  He is using his neb machine once per day.  He is having cough, using his rescue inhaler frequently.  He is a smoker with COPD.  He is not having any chest pain but is having shortness of breath.   Preventive Screening-Counseling & Management  Alcohol-Tobacco     Smoking Status: current     Smoking Cessation Counseling: yes     Smoke Cessation Stage: precontemplative     Tobacco Counseling: to quit use of tobacco products  Allergies (verified): No Known Drug Allergies  Past History:  Past Surgical History: Last updated: 04/17/2009 Open heart surgery, PFO repair, 2004 Cath, PCI per PMH, 06/1998  Family History: Last updated: 04/17/2009 Family History of Coronary Artery Disease:  Family History Breast cancer 1st degree relative <50  Social History: Last updated: 07/16/2010 Married, 1 daughter, relocated to [place] from [place].   Tobacco Use - Yes, 1ppd x 40+ years Alcohol Use - yes Drug Use - no Works full time in 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  Lives in [place], [place].   Risk Factors: Alcohol Use: <1 (11/25/2009) Exercise:  no (11/25/2009)  Risk Factors: Smoking Status: current (07/31/2010)  Past Medical History: 1.  CAD:  Inferior MI 11/99 (in [place]) with PCI to [facility].  Last LHC 2004 [facility]): EF 50%, patent RCA stent.  No obstructive disease.  Last Myoview 2008: EF 42%, inferior infarct, no ischemia. 2.  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  3.  H/o TIAs, now s/p PFO closure 2004 (in [place]). diabetes mellitus, type 2 - A1c 6.0% 07/2010 4.  HTN 5.  Hyperlipidemia 6.  Smoker 7. Diverticulitis, 2002 8. GERD 9.Ulcer 10. Headaches 11. Medical non-compliance 12. Chronic Active Nicotine Dependence 13. COPD 14. Seasonal Allegic Rhinitis  Family History: Reviewed history from 04/17/2009 and no changes required. Family History of Coronary Artery Disease:  Family History Breast cancer 1st degree relative <50  Social History: Reviewed history from 07/16/2010 and no changes required. Married, 1 daughter, relocated to [place] from [place].   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  Lives in [place], [place].   Review of Systems General:  Complains of weakness; denies fever, loss of appetite, malaise, sleep disorder, sweats, and weight loss. Eyes:  Denies blurring, discharge, double vision, eye irritation, eye  pain, halos, itching, light sensitivity, red eye, vision loss-1 eye, and vision loss-both eyes. ENT:  Complains of hoarseness, nasal congestion, postnasal drainage, and sinus pressure; denies decreased hearing, difficulty swallowing, ear discharge, earache, nosebleeds, ringing in ears, and sore throat. CV:  Complains of difficulty breathing at night, difficulty breathing while lying down, and shortness of breath with exertion; denies bluish discoloration of lips or nails, chest pain or discomfort, fainting, fatigue, leg cramps with exertion, lightheadness, near fainting, palpitations, swelling of feet, swelling of hands,  and weight gain. Resp:  Complains of cough, shortness of breath, and sputum productive; denies chest discomfort, chest pain with inspiration, coughing up blood, excessive snoring, hypersomnolence, morning headaches, pleuritic, and wheezing; Clear Sputum. GI:  Denies abdominal pain, bloody stools, change in bowel habits, constipation, dark tarry stools, diarrhea, excessive appetite, gas, hemorrhoids, indigestion, loss of appetite, nausea, vomiting, vomiting blood, and yellowish skin color. GU:  Denies decreased libido, discharge, dysuria, erectile dysfunction, genital sores, hematuria, incontinence, nocturia, urinary frequency, and urinary hesitancy. MS:  Denies joint pain, joint redness, joint swelling, loss of strength, low back pain, mid back pain, muscle aches, muscle , cramps, muscle weakness, stiffness, and thoracic pain. Derm:  Denies changes in color of skin, changes in nail beds, dryness, excessive perspiration, flushing, hair loss, insect bite(s), itching, lesion(s), poor wound healing, and rash. Neuro:  Denies brief paralysis, difficulty with concentration, disturbances in coordination, falling down, headaches, inability to speak, memory loss, numbness, poor balance, seizures, sensation of room spinning, tingling, tremors, visual disturbances, and weakness. Psych:  Denies alternate hallucination ( auditory/visual), anxiety, depression, easily angered, easily tearful, irritability, mental problems, panic attacks, sense of great danger, suicidal thoughts/plans, thoughts of violence, unusual visions or sounds, and thoughts /plans of harming others. Endo:  Denies cold intolerance, excessive hunger, excessive thirst, excessive urination, heat intolerance, polyuria, and weight change. Heme:  Denies abnormal bruising, bleeding, enlarge lymph nodes, fevers, pallor, and skin discoloration. Allergy:  Denies hives or rash, itching eyes, persistent infections, seasonal allergies, and sneezing.  Physical  Exam  General:  Well-developed,well-nourished,in no acute distress; alert,appropriate and cooperative throughout examination Head:  Normocephalic and atraumatic without obvious abnormalities. No apparent alopecia or balding. Eyes:  No corneal or conjunctival inflammation noted. EOMI. Perrla. Funduscopic exam benign, without hemorrhages, exudates or papilledema. Vision grossly normal. Ears:  External ear exam shows no significant lesions or deformities.  Otoscopic examination reveals clear canals, tympanic membranes are intact bilaterally without bulging, retraction, inflammation or discharge. Hearing is grossly normal bilaterally. Nose:  bilateral swollen nasal turbinates, mild erythema of the turbinates Mouth:  dry mucus membranes Lungs:  bilateral scattered inspiratory/expiratory wheezing heard; improved after neb treatments Heart:  normal s1, s2 sounds  Abdomen:  non distended Neurologic:  alert & oriented X3.  alert & oriented X3.   Skin:  Intact without suspicious lesions or rashes Psych:  Cognition and judgment appear intact. Alert and cooperative with normal attention span and concentration. 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  Pt encouraged to stop tobacco products completely.   Will intensify treatment with high dose steroids, increase home nebs to every 4 hours and put him on zithromax.  Also, patient given rx for more duonebs and ventolin rescue inhalers.  Pt was told to seek emergency care  if no improvement in next 12 hours.  He improved in the office after 2 duoneb treatments.  His lungs improved, wheezing resolved and pulse ox improved.  Pt was told to expect that  his blood sugars will most likely rise and he should monitor more closely while taking steroids.  The patient verbalized clear understanding.    Problem # 2:  ALLERGIC RHINITIS CAUSE UNSPECIFIED (ICD-477.9)  His updated medication list for this problem includes:    Cetirizine Hcl 10 Mg Tabs (Cetirizine hcl) .Marland Kitchen... Take 1 by mouth daily as needed for allergy symptoms    Fluticasone Propionate 50 Mcg/act Susp (Fluticasone propionate) .Marland Kitchen... 2 sprays per nostril once daily  His updated medication list for this problem includes:    Cetirizine Hcl 10 Mg Tabs (Cetirizine hcl) .Marland Kitchen... Take 1 by mouth daily as needed for allergy symptoms    Fluticasone Propionate 50 Mcg/act Susp (Fluticasone propionate) .Marland Kitchen... 2 sprays per nostril once daily  Problem # 3:  TOBACCO USE (ICD-305.1) Assessment: Unchanged The patient was counseled and advised to stop using all tobacco products.  Medical assistance was offered and the patient was encouraged to call 1-800-QUIT-NOW to get a smoking cessation coach.     Complete Medication List: 1)  Lipitor 10 Mg Tabs (Atorvastatin calcium) .... Take one tablet by mouth daily. 2)  Aspirin 81 Mg Tbec (Aspirin) .... Take one tablet by mouth daily 3)  Fish Oil Oil (Fish oil) .Marland Kitchen.. 1 capsule daily 4)  Carvedilol 12.5 Mg Tabs (Carvedilol) .Marland Kitchen.. 1 pill by mouth 2 times daily 5)  Losartan Potassium 50 Mg Tabs (Losartan potassium) .Marland Kitchen.. 1 by mouth daily 6)  Bupropion Hcl 300 Mg Xr24h-tab (Bupropion hcl) .Marland Kitchen.. 1 by mouth daily 7)  Cialis 20 Mg Tabs (Tadalafil) .Marland Kitchen.. 1 by mouth 30 minutes prior to intercourse 8)  Glucophage Xr 500 Mg Xr24h-tab (Metformin hcl) .... Take on by mouth daily 9)  Glucometer Lancets  .... Check two times a day as directed (250.00) 10)  Glucometer Strips  .... Check bs two times a day (250.00) 11)  Ventolin Hfa 108 (90 Base) Mcg/act Aers (Albuterol sulfate) .... 2 puffs every 3 hours as needed shortness of breath and wheezing and cough 12)  Prednisone 20 Mg Tabs  (Prednisone) .... Take 3 tabs by mouth daily for 2 days then 2 tabs by mouth daily for 5 days then 1 tab by mouth daily for 3 days 13)  Ipratropium-albuterol 0.5-2.5 (3) Mg/25ml Soln (Ipratropium-albuterol) .... Use 1 neb every 4 hours as needed for cough, wheezing, sob 14)  Cetirizine Hcl 10 Mg Tabs (Cetirizine hcl) .... Take 1 by mouth daily as needed for allergy symptoms 15)  Azithromycin 250 Mg Tabs (Azithromycin) .... Take 2 tabs by mouth on day 1 then 1 tab by mouth daily for 4 days 16)  Fluticasone Propionate 50 Mcg/act Susp (Fluticasone propionate) .... 2 sprays per nostril once daily  Patient Instructions: 1)  Tobacco is very bad for your health and your loved ones! You Should stop smoking!. 2)  Stop Smoking Tips: Choose a Quit date. Cut down before the Quit date. decide what you will do as a substitute when you feel the urge to smoke (gum,toothpick,exercise). 3)  Take your antibiotic as prescribed until ALL of it is gone, but stop if you develop a rash or swelling and contact our office as soon as possible. 4)  Acute sinusitis symptoms for less than 10 days are not helped by antibiotics.Use warm moist compresses, and over the counter decongestants (  only as directed). Call if no improvement in 5-7 days, sooner if increasing pain, fever, or new symptoms. 5)  Acute bronchitis symptoms for less than 10 days are not helped by antibiotics. take over the counter cough medications. call if no improvment in  5-7 days, sooner if increasing cough, fever, or new symptoms( shortness of breath, chest pain). 6)  The patient was informed that there is no on-call provider or services available at this clinic during off-hours (when the clinic is closed).  If the patient developed a problem or concern that required immediate attention, the patient was advised to go the the nearest available urgent care or emergency department for medical care.  The patient verbalized understanding.    7)  If getting worse or not  better go to the Emergency Room.   Prescriptions: FLUTICASONE PROPIONATE 50 MCG/ACT SUSP (FLUTICASONE PROPIONATE) 2 sprays per nostril once daily  #1 x 1   Entered and Authorized by:   [name] MD   Signed by:   [name] MD on 07/31/2010   Method used:   Faxed to ...       [facility] (retail)       [address]       [place], [place]    [place]       Ph: [phone]       Fax: [phone]   RxID:   [id number] AZITHROMYCIN 250 MG TABS (AZITHROMYCIN) take 2 tabs by mouth on day 1 then 1 tab by mouth daily for 4 days  #6 x 0   Entered and Authorized by:   [name] MD   Signed by:   [name] MD on 07/31/2010   Method used:   Faxed to ...       [facility] (retail)       [address]       [place], [place]    [place]       Ph: [phone]       Fax: [phone]   RxID:   [id number] CETIRIZINE HCL 10 MG TABS (CETIRIZINE HCL) take 1 by mouth daily as needed for allergy symptoms  #30 x 0   Entered and Authorized by:   [name] MD   Signed by:   [name] MD on 07/31/2010   Method used:   Faxed to ...       [facility] (retail)       [address]       [place], [place]    [place]       Ph: [phone]       Fax: [phone]   RxID:   [id number] IPRATROPIUM-ALBUTEROL 0.5-2.5 (3) MG/3ML SOLN (IPRATROPIUM-ALBUTEROL) use 1 neb every 4 hours as needed for cough, wheezing, SOB  #20 x 3   Entered and Authorized by:   [name] MD   Signed by:   [name] MD on 07/31/2010   Method used:   Faxed to ...       [facility] (retail)       [address]       [place], [place]    [place]       Ph: [phone]       Fax: [phone]   RxID:   [id number] PREDNISONE 20 MG TABS (PREDNISONE) take 3 tabs by mouth daily for 2 days then 2 tabs by mouth daily for 5 days then 1 tab by mouth daily for 3 days  #  19 x 0   Entered and Authorized by:   [name] MD   Signed  by:   [name] MD on 07/31/2010   Method used:   Faxed to ...       [facility] (retail)       [address]       [place], [place]    [place]       Ph: [phone]       Fax: [phone]   RxID:   [id number] VENTOLIN HFA 108 (90 BASE) MCG/ACT AERS (ALBUTEROL SULFATE) 2 puffs every 3 hours as needed shortness of breath and wheezing and cough  #1 x 3   Entered and Authorized by:   [name] MD   Signed by:   [name] MD on 07/31/2010   Method used:   Faxed to ...       [facility] (retail)       [address]       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

## 2010-09-27 ENCOUNTER — Telehealth: Payer: Self-pay | Admitting: Family Medicine

## 2010-09-29 ENCOUNTER — Telehealth: Payer: Self-pay | Admitting: Family Medicine

## 2010-10-01 NOTE — Progress Notes (Signed)
Summary: refill inhaler  Phone Note Call from Patient   Caller: Spouse Call For: Dr.Maclin Guerrette for Dr. Laural Benes Reason for Call: Refill Medication Complaint: Urinary/GYN Problems Summary of Call: Wife called needs refill on albuterol inhaler; Would like generic for 90 days which is cheaper// sent to  [facility] [phone] Initial call taken by: Providence Crosby LPN,  September 27, 2010 10:44 AM    Due to no known generic for Combivent an my reluctence to reduce him from combivent to albuterol instructions for his wife was to call back next week when Dr Laural Benes was available. it apears rthat there were refill on the combivent.

## 2010-10-02 ENCOUNTER — Encounter: Payer: Self-pay | Admitting: Family Medicine

## 2010-10-07 NOTE — Progress Notes (Signed)
  Phone Note Call from Patient   Caller: Spouse Reason for Call: Talk to Doctor Summary of Call: Patient's wife called regarding a Ventolin HFA inhaler which was prescribed for the patient.  The wife is calling because the inhaler is very expensive and she was wondering if the doctor could prescribe the patient with a generis grand inhaler.  You can fill the prescription at [facility] on [address].  You can contact the patient or his wife at [phone] Initial call taken by: Dorna Leitz,  September 29, 2010 2:27 PM  Follow-up for Phone Call        Please notify patient that the ventolin hfa is the cheapest one I  believe is available at the time.  Rx has been faxed to the pharmacy of choice.  Follow-up by: [name] MD,  September 29, 2010 2:44 PM    New/Updated Medications: VENTOLIN HFA 108 (90 BASE) MCG/ACT AERS (ALBUTEROL SULFATE) 2 puffs every 3 hours as needed shortness of breath, wheezing, cough Prescriptions: VENTOLIN HFA 108 (90 BASE) MCG/ACT AERS (ALBUTEROL SULFATE) 2 puffs every 3 hours as needed shortness of breath, wheezing, cough  #1 x 2   Entered and Authorized by:   [name] MD   Signed by:   [name] MD on 09/29/2010   Method used:   Faxed to ...       [facility] (retail)       [address]       [place], [place]    [place]       Ph: [phone]       Fax: [phone]   RxID:   [id number]

## 2010-10-30 ENCOUNTER — Other Ambulatory Visit: Payer: Self-pay | Admitting: *Deleted

## 2010-10-30 MED ORDER — LOSARTAN POTASSIUM 50 MG PO TABS: 50.0000 mg | ORAL_TABLET | Freq: Every day | ORAL | Status: DC

## 2010-11-04 ENCOUNTER — Telehealth: Payer: Self-pay | Admitting: Family Medicine

## 2010-11-05 ENCOUNTER — Telehealth: Payer: Self-pay | Admitting: Family Medicine

## 2010-11-11 NOTE — Medication Information (Signed)
Summary: Prescription   Prescription   Imported By: Eugenio Hoes 11/03/2010 14:49:45  _____________________________________________________________________  External Attachment:    Type:   Image     Comment:   External Document

## 2010-11-11 NOTE — Progress Notes (Signed)
Summary: [name]  ---- Converted from flag ---- ---- 11/05/2010 11:53 AM, Tony Watson wrote: Called patient was not at home left message that he need to come in today before 5pm for a evaluation if he's not able to make it advise patient need to follow-up with PCP.  ---- 11/04/2010 8:35 PM, [name] MD wrote: This patient needs to come in for an appointment because he is calling in for more nebulizer medication.  If he is using it every 4 hours around the clock then he needs an evaluation.   Please call him to come in.  If he doesn't agree to come in then advise him to see his PCP as soon as possible and please document that you've told him this in the chart. Thanks.  [name], MD, CDE, FAAFP ------------------------------

## 2010-11-11 NOTE — Progress Notes (Signed)
  Phone Note Call from Patient   Reason for Call: Talk to Doctor Complaint: Breathing Problems Summary of Call: [facility] called stated that [facility] for nebulizer medication name Ipratropium-albuterol 0.5-2.5 (3) mg 3mL. patient stated not receiving the amount that on bottle the bottle has 90  please call [facility] because they will not refill it until the 12th on next month..   If you wanted Dr. Hyacinth Meeker to handle it just send it to him Initial call taken by: Eugenio Hoes,  November 04, 2010 1:11 PM  Follow-up for Phone Call        I called [facility] and was told by pharmacist that the patient had his Rx transferred to [facility]  [facility] and the pharmacist said that she told the patient's wife that he was prescribed one box of the nebulizer medication with instructions to use every 4 hours only as needed for cough, SOB and wheezing.  If the patient was using more than 1 box in a month the pharmacist advised that the patient be seen for medical evaluation.  I told the pharmacist to allow him to pick up another box of the nebulizer medication and patient will need to come in for a  medical evaluation.  I had already told the patient that he needed to follow up with his PCP and I will tell him again.  I called and left message that patient could go ahead and pick up neb medication as needed.  The pharmacist said that the patient's wife had picked up an RX for the ventolin HFA for him today.  I will have the clinical support staff call the patient tomorrow to have him see his PCP for an appointment.  The Clinic at [facility] is closing tomorrow and if he will not come in for an appointment tomorrow he can certainly follow up with his PCP at [facility].   Follow-up by: [name] MD,  November 04, 2010 8:30 PM

## 2010-12-01 ENCOUNTER — Other Ambulatory Visit: Payer: Self-pay | Admitting: *Deleted

## 2010-12-01 MED ORDER — ATORVASTATIN CALCIUM 10 MG PO TABS: 10.0000 mg | ORAL_TABLET | Freq: Every day | ORAL | Status: DC

## 2010-12-02 ENCOUNTER — Other Ambulatory Visit: Payer: Self-pay | Admitting: *Deleted

## 2010-12-02 MED ORDER — ATORVASTATIN CALCIUM 10 MG PO TABS: 10.0000 mg | ORAL_TABLET | Freq: Every day | ORAL | Status: DC

## 2010-12-23 NOTE — Assessment & Plan Note (Signed)
[facility] OFFICE NOTE   ATIBA, KIMBERLIN                       MRN:          [id number]  DATE:03/04/2007                            DOB:          [birth year]    HISTORY OF PRESENT ILLNESS:  Mr. Cavanagh returns today to discuss the  findings of his stress Myoview, as well as his lipids and blood work.  Please see my note from February 15, 2007.   Stress Myoview was negative adequate. He had an EF of 42% with inferior  wall infarct but no ischemia.   His total cholesterol was 163. His triglycerides were 445. HDL was 26.  LDL could not be calculated. This is on Lipitor 10. His LFT's were  normal. His blood sugar was 116.   I have had a long talk with him today about low-carbohydrate diets for  his triglycerides, as well as his blood sugar. I have warned him about  pancreatitis.   I have started him on Pheno-fibrate 160 mg daily along with his Lipitor  10 mg. Will have him return in 6 weeks for fasting lipids and LFT's.  Will also obtain a hemoglobin A1C.   I will see him back in 6 months.   I suspect his EF is better than the nuclear study shows. We may want to  consider an ace inhibitor down the road. Do not want to start 2 new  drugs today.     Thomas C. Daleen Squibb, MD, [facility]  Electronically Signed    TCW/MedQ  DD: 03/04/2007  DT: 03/04/2007  Job #: [id number]
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
## 2011-01-08 ENCOUNTER — Telehealth: Payer: Self-pay | Admitting: *Deleted

## 2011-01-08 NOTE — Telephone Encounter (Signed)
Ok, can you send him in the chantix starter pack

## 2011-01-08 NOTE — Telephone Encounter (Signed)
Pt would like to have a chantix starter pack.  He had one a couple of years ago, stopped smoking but started back again, doesn't think he took the chantix for long enough time.  [facility].

## 2011-01-09 MED ORDER — VARENICLINE TARTRATE 0.5 MG X 11 & 1 MG X 42 PO MISC: ORAL | Status: DC

## 2011-01-27 ENCOUNTER — Other Ambulatory Visit: Payer: Self-pay | Admitting: *Deleted

## 2011-01-27 ENCOUNTER — Encounter: Payer: Self-pay | Admitting: Cardiovascular Disease

## 2011-01-27 MED ORDER — GLUCOSE BLOOD VI STRP: ORAL_STRIP | Status: AC

## 2011-01-28 ENCOUNTER — Telehealth: Payer: Self-pay | Admitting: *Deleted

## 2011-01-28 NOTE — Telephone Encounter (Signed)
Prior Tony Watson is needed for truetest glucose test strips, form is on your shelf.

## 2011-01-28 NOTE — Telephone Encounter (Signed)
Note continued form below, I meant form is on your desk.

## 2011-01-28 NOTE — Telephone Encounter (Signed)
Will complete in am.

## 2011-02-02 NOTE — Telephone Encounter (Signed)
Prior auth for truetest test strips was denied, letter is on your desk.

## 2011-02-03 NOTE — Telephone Encounter (Signed)
Call pt, let know insurance denied "truetest test strips"  His choice, can purchase on his own, or we can write for generic testing meter, lancets, and strips

## 2011-02-04 NOTE — Telephone Encounter (Signed)
Patient advised and he will speak with wife and give [place] a call back

## 2011-02-13 ENCOUNTER — Other Ambulatory Visit: Payer: Self-pay | Admitting: *Deleted

## 2011-02-13 MED ORDER — BLOOD GLUCOSE MONITORING SUPPL MISC: 1.0000 | Freq: Two times a day (BID) | Status: DC

## 2011-02-15 ENCOUNTER — Other Ambulatory Visit: Payer: Self-pay | Admitting: Family Medicine

## 2011-02-16 NOTE — Telephone Encounter (Signed)
Patient not seen in over a year.

## 2011-02-16 NOTE — Telephone Encounter (Signed)
Ok to refill #30, 0 refills  F/u CPX in the next month

## 2011-02-17 NOTE — Telephone Encounter (Signed)
Please see note, has this been done?

## 2011-02-17 NOTE — Telephone Encounter (Signed)
Was unable to reach patient until today and appts set up for labs and cpx also medication sent to pharmacy 30 with 0 refills

## 2011-03-13 ENCOUNTER — Other Ambulatory Visit: Payer: Self-pay | Admitting: *Deleted

## 2011-03-13 MED ORDER — IPRATROPIUM-ALBUTEROL 0.5-2.5 (3) MG/3ML IN SOLN: 3.0000 mL | RESPIRATORY_TRACT | Status: DC | PRN

## 2011-03-13 MED ORDER — ALBUTEROL SULFATE HFA 108 (90 BASE) MCG/ACT IN AERS: 2.0000 | INHALATION_SPRAY | RESPIRATORY_TRACT | Status: DC | PRN

## 2011-03-17 ENCOUNTER — Other Ambulatory Visit (INDEPENDENT_AMBULATORY_CARE_PROVIDER_SITE_OTHER): Payer: 59 | Admitting: Family Medicine

## 2011-03-17 DIAGNOSIS — Z79899 Other long term (current) drug therapy: Secondary | ICD-10-CM

## 2011-03-17 DIAGNOSIS — E119 Type 2 diabetes mellitus without complications: Secondary | ICD-10-CM

## 2011-03-17 DIAGNOSIS — E785 Hyperlipidemia, unspecified: Secondary | ICD-10-CM

## 2011-03-17 DIAGNOSIS — Z125 Encounter for screening for malignant neoplasm of prostate: Secondary | ICD-10-CM

## 2011-03-17 LAB — CBC WITH DIFFERENTIAL/PLATELET
Basophils Absolute: 0 10*3/uL (ref 0.0–0.1)
Eosinophils Absolute: 0.3 10*3/uL (ref 0.0–0.7)
HCT: 44.4 % (ref 39.0–52.0)
Lymphs Abs: 2.2 10*3/uL (ref 0.7–4.0)
MCHC: 34.2 g/dL (ref 30.0–36.0)
MCV: 89.8 fl (ref 78.0–100.0)
Monocytes Absolute: 0.6 10*3/uL (ref 0.1–1.0)
Monocytes Relative: 12.5 % — ABNORMAL HIGH (ref 3.0–12.0)
Platelets: 155 10*3/uL (ref 150.0–400.0)
RDW: 13.6 % (ref 11.5–14.6)

## 2011-03-17 LAB — HEPATIC FUNCTION PANEL
Alkaline Phosphatase: 38 U/L — ABNORMAL LOW (ref 39–117)
Bilirubin, Direct: 0.1 mg/dL (ref 0.0–0.3)
Total Bilirubin: 0.9 mg/dL (ref 0.3–1.2)

## 2011-03-17 LAB — BASIC METABOLIC PANEL
Calcium: 9.4 mg/dL (ref 8.4–10.5)
GFR: 95.39 mL/min (ref >60.00)
Sodium: 141 mEq/L (ref 135–145)

## 2011-03-17 LAB — HEMOGLOBIN A1C: Hgb A1c MFr Bld: 6.3 % (ref 4.6–6.5)

## 2011-03-17 LAB — LIPID PANEL
HDL: 32.4 mg/dL — ABNORMAL LOW (ref >39.00)
Total CHOL/HDL Ratio: 3

## 2011-03-18 LAB — MICROALBUMIN / CREATININE URINE RATIO: Microalb Creat Ratio: 0.4 mg/g (ref 0.0–30.0)

## 2011-03-20 ENCOUNTER — Telehealth: Payer: Self-pay

## 2011-03-20 NOTE — Telephone Encounter (Signed)
Spoke with patient regarding refill on carvedilol; told the patient needs to make an appointment with Dr. Shirlee Latch and the patient states is going to continue to follow with Dr. Dallas Schimke and if he needs anything he will contact our office.  He will ask Dr. Dallas Schimke to refill all medications.

## 2011-03-23 ENCOUNTER — Encounter: Payer: Self-pay | Admitting: Family Medicine

## 2011-03-23 ENCOUNTER — Ambulatory Visit (INDEPENDENT_AMBULATORY_CARE_PROVIDER_SITE_OTHER): Payer: 59 | Admitting: Family Medicine

## 2011-03-23 VITALS — BP 130/84 | HR 92 | Temp 98.1°F | Ht 71.5 in | Wt 202.0 lb

## 2011-03-23 DIAGNOSIS — Z8679 Personal history of other diseases of the circulatory system: Secondary | ICD-10-CM

## 2011-03-23 DIAGNOSIS — I10 Essential (primary) hypertension: Secondary | ICD-10-CM

## 2011-03-23 DIAGNOSIS — Z0000 Encounter for general adult medical examination without abnormal findings: Secondary | ICD-10-CM

## 2011-03-23 DIAGNOSIS — J438 Other emphysema: Secondary | ICD-10-CM | POA: Insufficient documentation

## 2011-03-23 DIAGNOSIS — E785 Hyperlipidemia, unspecified: Secondary | ICD-10-CM

## 2011-03-23 DIAGNOSIS — I2510 Atherosclerotic heart disease of native coronary artery without angina pectoris: Secondary | ICD-10-CM

## 2011-03-23 DIAGNOSIS — J449 Chronic obstructive pulmonary disease, unspecified: Secondary | ICD-10-CM | POA: Insufficient documentation

## 2011-03-23 DIAGNOSIS — E119 Type 2 diabetes mellitus without complications: Secondary | ICD-10-CM

## 2011-03-23 DIAGNOSIS — F17200 Nicotine dependence, unspecified, uncomplicated: Secondary | ICD-10-CM

## 2011-03-23 MED ORDER — VARENICLINE TARTRATE 0.5 MG X 11 & 1 MG X 42 PO MISC: ORAL | Status: AC

## 2011-03-23 MED ORDER — FLUTICASONE-SALMETEROL 250-50 MCG/DOSE IN AEPB: 1.0000 | INHALATION_SPRAY | Freq: Two times a day (BID) | RESPIRATORY_TRACT | Status: DC

## 2011-03-23 MED ORDER — VARENICLINE TARTRATE 1 MG PO TABS: 1.0000 mg | ORAL_TABLET | Freq: Two times a day (BID) | ORAL | Status: AC

## 2011-03-23 NOTE — Patient Instructions (Signed)
F/u 4-6 weeks  Chronic Obstructive Pulmonary Disease (COPD) Chronic obstructive pulmonary disease (COPD) is a lasting (permanent) lung disease. The lungs become damaged, making it hard to get air in and out of your lungs. The damage to your lungs cannot be changed. COPD is caused by long-term smoking or breathing in toxins. Symptoms can include shortness of breath, coughing, and wheezing. Your doctor will treat your breathing to help you feel better. HOME CARE  If you smoke, stop smoking. Smoking makes it hard for your body to get oxygen and causes more damage to your lungs. Avoid secondhand smoke.   Drink enough water and fluids to keep your urine clear or pale yellow. This helps to loosen any thick mucus (phlegm) you may have.   Use a humidifier or vaporizer. This may help loosen the thick mucus.   Use home oxygen as told by your doctor.   Take your medicine as told by your doctor.   Talk to your doctor about using cough syrup or other over-the-counter medicines.   Talk to your doctor about vaccines that can help prevent other lung problems. There are vaccines for pneumonia and the flu (influenza). It is important that you avoid getting illnesses that affect your lungs. These could cause serious problems.  GET HELP IF:  You cough up thick mucus that is red, yellow, or green AND this is a change for you.   You have a temperature by mouth above 102.   Your breathing is not as good as you think it should be.   Your breathing becomes worse when you walk or exercise.   You are running out of the medicine you take for your breathing.  GET HELP RIGHT AWAY IF:  Your heart is beating fast or is skipping beats.   You become confused, very weak, or pass out (faint).   It is very hard to breathe or to catch your breath.   You get chest pain or pressure.   You have a temperature by mouth above 102, not controlled by medicine.  MAKE SURE YOU:   Understand these instructions.   Will  watch your condition.   Will get help right away if you are not doing well or get worse.  Document Released: 01/13/2008 Document Re-Released: 10/21/2009 [facility] Patient Information 2011 [place], [place].

## 2011-03-23 NOTE — Progress Notes (Signed)
Subjective:    Patient ID: Tony Watson, male    DOB: 19-Apr-1951, 60 y.o.   MRN: [id number]  HPI  60 year old here for health maintenance and f/u multiple medical problems:  Preventative Health Maintenance Visit:  Health Maintenance Summary Reviewed and updated, unless pt declines services.  Tobacco History Reviewed. Alcohol: No concerns, no excessive use Exercise Habits: Some activity, rec at least 30 mins 5 times a week STD concerns: no risk or activity to increase risk Drug Use: None Encouraged self-testicular check  Health Maintenance  Topic Date Due  . Tetanus/tdap  12/24/1970  . Colonoscopy  12/23/2001  . Influenza Vaccine  05/11/2011  reports he had a colonoscopy 2 years ago  Labs reviewed with the patient.  Lab Results  Component Value Date   PSA 0.18 03/17/2011   PSA 0.15 11/25/2009   PSA 0.16 04/17/2009    COPD: Trouble breathing - in November, got a trouch of asthma. Three weeks and could not get in - now has a nebulizer. Primatine mist - inhaler in the past.  60 years old started smoking. Now taking primatine tablets (ephedrine) with diminishing success. Short of breath with minimal exertion right now. Continues to smoke. COPD, severe, GOLD 3 and dyspnea  Needs colon - a couple of years ago. Had per his report  Diabetes Mellitus: Tolerating Medications: Compliance with diet: yes Exercise: minimal Foot problems: none Hypoglycemia: none No nausea, vomitting, blurred vision, polyuria.  Lab Results  Component Value Date   HGBA1C 6.3 03/17/2011    Basic Metabolic Panel:    Component Value Date/Time   NA 141 03/17/2011 0943   K 4.1 03/17/2011 0943   CL 103 03/17/2011 0943   CO2 29 03/17/2011 0943   BUN 15 03/17/2011 0943   CREATININE 0.9 03/17/2011 0943   GLUCOSE 122* 03/17/2011 0943   GLUCOSE 117 07/16/2010 1526   CALCIUM 9.4 03/17/2011 0943   HTN: Tolerating all medications without side effects - improved Stable and at goal No CP, no sob. No HA.  BP Readings from  Last 3 Encounters:  03/23/11 130/84  07/31/10 140/86  07/16/10 159/85   Lipids: Doing well, stable. Tolerating meds fine with no SE. Panel reviewed with patient.  Lipids:    Component Value Date/Time   CHOL 109 03/17/2011 0943   TRIG 89.0 03/17/2011 0943   HDL 32.40* 03/17/2011 0943   LDLDIRECT 45.8 11/18/2009 0811   VLDL 17.8 03/17/2011 0943   CHOLHDL 3 03/17/2011 0943    Lab Results  Component Value Date   ALT 42 03/17/2011   AST 37 03/17/2011   ALKPHOS 38* 03/17/2011   BILITOT 0.9 03/17/2011   CAD, on ASA, Coreg, Losartan. No CP  50 pack years of smoking history  The PMH, PSH, Social History, Family History, Medications, and allergies have been reviewed in [facility], and have been updated if relevant.  Review of Systems  General: Denies fever, chills, sweats. No significant weight loss. Eyes: Denies blurring,significant itching ENT: Denies earache, sore throat, and hoarseness. Cardiovascular: Denies chest pains, palpitations, dyspnea on exertion Respiratory: Noted above Breast: no concerns about lumps GI: Denies nausea, vomiting, diarrhea, constipation, change in bowel habits, abdominal pain, melena, hematochezia GU: Denies penile discharge, ED, urinary flow / outflow problems. No STD concerns. Musculoskeletal: Denies back pain, joint pain Derm: Denies rash, itching Neuro: Denies  paresthesias, frequent falls, frequent headaches Psych: Denies depression, anxiety Endocrine: Denies cold intolerance, heat intolerance, polydipsia Heme: Denies enlarged lymph nodes Allergy: No hayfever  Objective:   Physical Exam   Physical Exam  Blood pressure 130/84, pulse 92, temperature 98.1 F (36.7 C), temperature source Oral, height 5' 11.5" (1.816 m), weight 202 lb (91.627 kg), SpO2 98.00%.  PE: GEN: well developed, well nourished, no acute distress Eyes: conjunctiva and lids normal, PERRLA, EOMI ENT: TM clear, nares clear, oral exam WNL Neck: supple, no lymphadenopathy, no  thyromegaly, no JVD Pulm: clear to auscultation and percussion, respiratory effort normal CV: regular rate and rhythm, S1-S2, no murmur, rub or gallop, no bruits, peripheral pulses normal and symmetric, no cyanosis, clubbing, edema or varicosities Chest: no scars, masses, no gynecomastia   GI: soft, non-tender; no hepatosplenomegaly, masses; active bowel sounds all quadrants GU: no hernia, testicular mass, penile discharge, priapism or prostate enlargement Lymph: no cervical, axillary or inguinal adenopathy MSK: gait normal, muscle tone and strength WNL, no joint swelling, effusions, discoloration, crepitus  SKIN: clear, good turgor, color WNL, no rashes, lesions, or ulcerations Neuro: normal mental status, normal strength, sensation, and motion Psych: alert; oriented to person, place and time, normally interactive and not anxious or depressed in appearance.      Assessment & Plan:   1. Routine general medical examination at a health care facility : The patient's preventative maintenance and recommended screening tests for an annual wellness exam were reviewed in full today. Brought up to date unless services declined.  Counselled on the importance of diet, exercise, and its role in overall health and mortality. The patient's FH and SH was reviewed, including their home life, tobacco status, and drug and alcohol status.    2. HYPERTENSION, UNSPECIFIED : cont current meds   3. HYPERLIPIDEMIA-MIXED : cont Lipitor   4. DIABETES MELLITUS, TYPE II : stable, cont current meds   5. CAD, UNSPECIFIED SITE : stable, on ACE, b-blocker, ASA, statin   6. TRANSIENT ISCHEMIC ATTACKS, HX OF    7. TOBACCO USE : 5 minutes spent in counselling: The patient does smoke cigarettes, and we discussed that tobacco is harmful to one's overall health, and I made the recommendation to quit tobacco products.  Chantix started Spirometry w/ graph, Spirometry w/ graph  8. COPD, severe : Severe. D/c primatine tablets,  start Advair. Albuterol prn. Recheck 1 month. Fluticasone-Salmeterol (ADVAIR DISKUS) 250-50 MCG/DOSE AEPB   Spirometry: FEV1 39%, FVC 64%, FEV1/FVC 61% Gold Category 3 COPD

## 2011-03-27 ENCOUNTER — Other Ambulatory Visit: Payer: Self-pay | Admitting: *Deleted

## 2011-03-27 MED ORDER — CARVEDILOL 12.5 MG PO TABS: 12.5000 mg | ORAL_TABLET | Freq: Two times a day (BID) | ORAL | Status: DC

## 2011-05-04 ENCOUNTER — Other Ambulatory Visit: Payer: Self-pay | Admitting: *Deleted

## 2011-05-04 MED ORDER — METFORMIN HCL ER 500 MG PO TB24: 500.0000 mg | ORAL_TABLET | Freq: Every day | ORAL | Status: DC

## 2011-06-02 ENCOUNTER — Other Ambulatory Visit: Payer: Self-pay | Admitting: Family Medicine

## 2011-09-07 ENCOUNTER — Other Ambulatory Visit: Payer: Self-pay | Admitting: Family Medicine

## 2011-10-26 ENCOUNTER — Other Ambulatory Visit: Payer: Self-pay | Admitting: Family Medicine

## 2011-12-14 ENCOUNTER — Other Ambulatory Visit: Payer: Self-pay | Admitting: Family Medicine

## 2011-12-14 ENCOUNTER — Telehealth: Payer: Self-pay

## 2011-12-14 MED ORDER — BUDESONIDE-FORMOTEROL FUMARATE 160-4.5 MCG/ACT IN AERO: 2.0000 | INHALATION_SPRAY | Freq: Two times a day (BID) | RESPIRATORY_TRACT | Status: DC

## 2011-12-14 NOTE — Telephone Encounter (Signed)
rx sent to pharmacy

## 2011-12-14 NOTE — Telephone Encounter (Signed)
Pt was in [place] and a friend told him about Celebrex for breathing difficulty. I explained Celebrex is usually given as antiinflammatory for pain. Pt said he would call back after getting correct name of med.

## 2011-12-14 NOTE — Telephone Encounter (Signed)
Patient would like to switch advair to symbicort is this okay?

## 2012-01-18 ENCOUNTER — Other Ambulatory Visit: Payer: Self-pay | Admitting: Family Medicine

## 2012-01-20 ENCOUNTER — Other Ambulatory Visit: Payer: Self-pay | Admitting: Family Medicine

## 2012-02-23 ENCOUNTER — Other Ambulatory Visit: Payer: Self-pay | Admitting: Family Medicine

## 2012-02-26 ENCOUNTER — Telehealth: Payer: Self-pay | Admitting: *Deleted

## 2012-02-26 NOTE — Telephone Encounter (Signed)
Prior Berkley Harvey is needed for patient's albuterol nebulizer solution.  Insurance requires prior auth because they will not cover both inhaler and nebulizer solution.  Form is on your desk.

## 2012-02-26 NOTE — Telephone Encounter (Signed)
Form faxed

## 2012-02-26 NOTE — Telephone Encounter (Signed)
Completed in outbox.

## 2012-02-29 NOTE — Telephone Encounter (Signed)
Iprat-Albut prior Berkley Harvey has been approved; faxed approval letter to [facility]. Letter placed on doctor's desk for signature and scanning.

## 2012-03-23 ENCOUNTER — Other Ambulatory Visit: Payer: Self-pay | Admitting: Family Medicine

## 2012-04-25 ENCOUNTER — Other Ambulatory Visit: Payer: Self-pay | Admitting: Family Medicine

## 2012-04-25 NOTE — Telephone Encounter (Signed)
Received refill request electronically. Patient has not been seen since 03/23/11. Is it okay to refill medication?

## 2012-04-26 NOTE — Telephone Encounter (Signed)
Ok to refill #30 only, 0 refills  Has DM, multiple other probs. Please schedule a full CPX (30 mins) with labs 1 week before - has not had appropriate follow-up

## 2012-04-26 NOTE — Telephone Encounter (Signed)
[facility] and patient stated that he will call and schedule appointment.

## 2012-05-23 ENCOUNTER — Telehealth: Payer: Self-pay | Admitting: Family Medicine

## 2012-05-23 DIAGNOSIS — E119 Type 2 diabetes mellitus without complications: Secondary | ICD-10-CM

## 2012-05-23 DIAGNOSIS — R5383 Other fatigue: Secondary | ICD-10-CM

## 2012-05-23 DIAGNOSIS — E785 Hyperlipidemia, unspecified: Secondary | ICD-10-CM

## 2012-05-23 DIAGNOSIS — I10 Essential (primary) hypertension: Secondary | ICD-10-CM

## 2012-05-23 NOTE — Telephone Encounter (Signed)
Message copied by Judy Pimple on Mon May 23, 2012  9:17 PM ------      Message from: Alvina Chou      Created: Mon May 23, 2012 12:00 PM      Regarding: Lab orders for Tuesday, 10.15.13       This Dr Cyndie Chime pt,Dr C and Dr B are out, need F/u lab orders and patient also requesting Testosterone any Thyroid checked. Please help.... Thanks, T

## 2012-05-24 ENCOUNTER — Other Ambulatory Visit (INDEPENDENT_AMBULATORY_CARE_PROVIDER_SITE_OTHER): Payer: 59

## 2012-05-24 DIAGNOSIS — E785 Hyperlipidemia, unspecified: Secondary | ICD-10-CM

## 2012-05-24 DIAGNOSIS — I10 Essential (primary) hypertension: Secondary | ICD-10-CM

## 2012-05-24 DIAGNOSIS — R5383 Other fatigue: Secondary | ICD-10-CM

## 2012-05-24 DIAGNOSIS — R5381 Other malaise: Secondary | ICD-10-CM

## 2012-05-24 DIAGNOSIS — E119 Type 2 diabetes mellitus without complications: Secondary | ICD-10-CM

## 2012-05-24 LAB — HEMOGLOBIN A1C: Hgb A1c MFr Bld: 6.7 % — ABNORMAL HIGH (ref 4.6–6.5)

## 2012-05-24 LAB — COMPREHENSIVE METABOLIC PANEL
ALT: 47 U/L (ref 0–53)
BUN: 14 mg/dL (ref 6–23)
CO2: 27 mEq/L (ref 19–32)
Creatinine, Ser: 1.1 mg/dL (ref 0.4–1.5)
GFR: 73.24 mL/min (ref >60.00)
Total Bilirubin: 0.5 mg/dL (ref 0.3–1.2)

## 2012-05-24 LAB — TSH: TSH: 1.85 u[IU]/mL (ref 0.35–5.50)

## 2012-05-24 LAB — LIPID PANEL
Cholesterol: 145 mg/dL (ref 0–200)
HDL: 28 mg/dL — ABNORMAL LOW (ref >39.00)
Triglycerides: 205 mg/dL — ABNORMAL HIGH (ref 0.0–149.0)

## 2012-05-30 ENCOUNTER — Other Ambulatory Visit: Payer: Self-pay | Admitting: Family Medicine

## 2012-05-30 ENCOUNTER — Ambulatory Visit (INDEPENDENT_AMBULATORY_CARE_PROVIDER_SITE_OTHER): Payer: 59 | Admitting: Family Medicine

## 2012-05-30 ENCOUNTER — Encounter: Payer: Self-pay | Admitting: Family Medicine

## 2012-05-30 VITALS — BP 128/76 | HR 88 | Temp 98.2°F | Ht 70.75 in | Wt 214.0 lb

## 2012-05-30 DIAGNOSIS — Z9119 Patient's noncompliance with other medical treatment and regimen: Secondary | ICD-10-CM

## 2012-05-30 DIAGNOSIS — Z23 Encounter for immunization: Secondary | ICD-10-CM

## 2012-05-30 DIAGNOSIS — E785 Hyperlipidemia, unspecified: Secondary | ICD-10-CM

## 2012-05-30 DIAGNOSIS — I2510 Atherosclerotic heart disease of native coronary artery without angina pectoris: Secondary | ICD-10-CM

## 2012-05-30 DIAGNOSIS — Z0000 Encounter for general adult medical examination without abnormal findings: Secondary | ICD-10-CM

## 2012-05-30 DIAGNOSIS — I5020 Unspecified systolic (congestive) heart failure: Secondary | ICD-10-CM

## 2012-05-30 DIAGNOSIS — I10 Essential (primary) hypertension: Secondary | ICD-10-CM

## 2012-05-30 DIAGNOSIS — E119 Type 2 diabetes mellitus without complications: Secondary | ICD-10-CM

## 2012-05-30 DIAGNOSIS — Z91199 Patient's noncompliance with other medical treatment and regimen due to unspecified reason: Secondary | ICD-10-CM

## 2012-05-30 DIAGNOSIS — I509 Heart failure, unspecified: Secondary | ICD-10-CM

## 2012-05-30 DIAGNOSIS — Z8679 Personal history of other diseases of the circulatory system: Secondary | ICD-10-CM

## 2012-05-30 MED ORDER — LOSARTAN POTASSIUM 50 MG PO TABS: 50.0000 mg | ORAL_TABLET | Freq: Every day | ORAL | Status: DC

## 2012-05-30 MED ORDER — ATORVASTATIN CALCIUM 10 MG PO TABS: 10.0000 mg | ORAL_TABLET | Freq: Every day | ORAL | Status: DC

## 2012-05-30 MED ORDER — CARVEDILOL 3.125 MG PO TABS: 3.1250 mg | ORAL_TABLET | Freq: Two times a day (BID) | ORAL | Status: DC

## 2012-05-30 NOTE — Patient Instructions (Signed)
REFERRAL: GO THE THE FRONT ROOM AT THE ENTRANCE OF OUR CLINIC, NEAR CHECK IN. ASK FOR MARION. SHE WILL HELP YOU SET UP YOUR REFERRAL. DATE: TIME:   F/u 2 months 

## 2012-05-30 NOTE — Progress Notes (Signed)
Nature conservation officer at [facility] [address] [place] [address] Phone: [phone] Fax: [phone]  Date:  05/30/2012   Name:  Tony Watson   DOB:  [birth year]   MRN:  [id number] Gender: male Age: 61 y.o.  PCP:  Tony Beat, MD  Evaluating MD: Tony Beat, MD   Chief Complaint: Follow-up   History of Present Illness:  Tony Watson is a 61 y.o. pleasant patient who presents with the following:  Full CPX: the patient presents for a full physical examination, but he also presents for chronic followup of multiple complex medical problems, and he wishes to have all addressed today.  Colonoscopy - several years ago Zostavax Pneumovax  Preventative Health Maintenance Visit:  Health Maintenance Summary Reviewed and updated, unless pt declines services.  Tobacco History Reviewed. Alcohol: No concerns, no excessive use Exercise Habits: Some activity, rec at least 30 mins 5 times a week STD concerns: no risk or activity to increase risk Drug Use: None Encouraged self-testicular check  Health Maintenance  Topic Date Due  . Pneumococcal Polysaccharide Vaccine (#1) 12/23/1953  . Tetanus/tdap  12/24/1970  . Colonoscopy  12/23/2001  . Zostavax  12/24/2011  . Influenza Vaccine  04/10/2012    Labs reviewed with the patient.  Results for orders placed in visit on 05/24/12  COMPREHENSIVE METABOLIC PANEL      Component Value Range   Sodium 136  135 - 145 mEq/L   Potassium 4.0  3.5 - 5.1 mEq/L   Chloride 100  96 - 112 mEq/L   CO2 27  19 - 32 mEq/L   Glucose, Bld 135 (*) 70 - 99 mg/dL   BUN 14  6 - 23 mg/dL   Creatinine, Ser 1.1  0.4 - 1.5 mg/dL   Total Bilirubin 0.5  0.3 - 1.2 mg/dL   Alkaline Phosphatase 34 (*) 39 - 117 U/L   AST 42 (*) 0 - 37 U/L   ALT 47  0 - 53 U/L   Total Protein 7.9  6.0 - 8.3 g/dL   Albumin 4.2  3.5 - 5.2 g/dL   Calcium 9.3  8.4 - 86.5 mg/dL   GFR 78.46  >96.29 mL/min  HEMOGLOBIN A1C      Component Value Range   Hemoglobin A1C  6.7 (*) 4.6 - 6.5 %  LIPID PANEL      Component Value Range   Cholesterol 145  0 - 200 mg/dL   Triglycerides 528.4 (*) 0.0 - 149.0 mg/dL   HDL 13.24 (*) >40.10 mg/dL   VLDL 27.2 (*) 0.0 - 53.6 mg/dL   Total CHOL/HDL Ratio 5    TSH      Component Value Range   TSH 1.85  0.35 - 5.50 uIU/mL  TESTOSTERONE      Component Value Range   Testosterone 427.44  350.00 - 890.00 ng/dL  LDL CHOLESTEROL, DIRECT      Component Value Range   Direct LDL 90.7     S/p MI, CAD s/p PCI, patient self d/c Coreg and ARB on his own, d/c lipitor. He actually was not sure that he was supposed to be on these medications. Actually recall reviewing these with him last year as well.  Diabetes mellitus. Hemoglobin A1c 6.7. Compliant. The patient is not exercising. He is moderately compliant with his diet. He is not checking his own sugars.  Chronic obstructive pulmonary disease, severe. He reports compliance with using Symbicort, and he thinks it is helping him breathe. He does continue to smoke.  He is pre-contemplative at this point. Breathing ok, 1/2 - 3/4 pack a day.  Smoking Symbicort.   Diminished ejection fraction on his last nuclear medicine study. EF appeared to be around 45%, and he was supposed to maintain followup with cardiology, however he never did so.  Patient Active Problem List  Diagnosis  . DIABETES MELLITUS, TYPE II  . HYPERLIPIDEMIA-MIXED  . TOBACCO USE  . HYPERTENSION, UNSPECIFIED  . CAD, UNSPECIFIED SITE  . OTHER MALAISE AND FATIGUE  . PARESTHESIA  . TRANSIENT ISCHEMIC ATTACKS, HX OF  . ALLERGIC RHINITIS CAUSE UNSPECIFIED  . COPD, severe    Past Medical History  Diagnosis Date  . CAD (coronary artery disease)     inferior MI 11/99 (in [place]) with PCI to [facility]. last LHC 2004 [facility]): EF 50%, patent RCA stent. no obstructive disease. last myoview 2008: EF 42%, inferior infarct, no ischemia.   . Ischemic cardiomyopathy     mild with EF 42% on myoviews 2008.  Marland Kitchen TIA (transient ischemic  attack)     hx; now s/p PFO closure 2004 (in [place])  . DM2 (diabetes mellitus, type 2)     A1c 6.0% 12/11  . HTN (hypertension)   . Hyperlipidemia   . Smoker   . Diverticulosis 2002  . GERD (gastroesophageal reflux disease)   . Ulcer   . Generalized headaches   . Medical non-compliance   . Nicotine dependence     chronic, active  . COPD (chronic obstructive pulmonary disease)   . Seasonal allergic rhinitis     Past Surgical History  Procedure Date  . Open heart surgery 2004    PFO repair  . Cardiac catheterization 11/99    CI per PMH    History  Substance Use Topics  . Smoking status: Current Every Day Smoker -- 0.8 packs/day for 50 years    Types: Cigarettes  . Smokeless tobacco: Never Used   Comment: 1 ppd +40 years  . Alcohol Use: Yes     beer weekly    Family History  Problem Relation Age of Onset  . Coronary artery disease      family hx  . Breast cancer      1st egree relative <50    No Known Allergies  Medication list has been reviewed and updated.  Outpatient Prescriptions Prior to Visit  Medication Sig Dispense Refill  . aspirin 81 MG EC tablet Take 81 mg by mouth daily.        . Blood Glucose Monitoring Suppl MISC 1 strip by Other route 2 (two) times daily. Please supply patient with Glucose Test Strips that will work with monitor.  100 each  3  . budesonide-formoterol (SYMBICORT) 160-4.5 MCG/ACT inhaler Inhale 2 puffs into the lungs 2 (two) times daily.  1 Inhaler  11  . carvedilol (COREG) 12.5 MG tablet Take 1 tablet (12.5 mg total) by mouth 2 (two) times daily.  60 tablet  0  . ipratropium-albuterol (DUONEB) 0.5-2.5 (3) MG/3ML SOLN USE 1 VIAL VIA NEBULIZER EVERY 4 HOURS AS NEEDED  360 mL  0  . ipratropium-albuterol (DUONEB) 0.5-2.5 (3) MG/3ML SOLN USE 1 VIAL VIA NEBULIZER EVERY 4 HOURS AS NEEDED  3 mL  0  . metFORMIN (GLUCOPHAGE-XR) 500 MG 24 hr tablet TAKE 1 TABLET BY MOUTH ONCE A DAY  90 tablet  2  . NON FORMULARY Glucometer lancets and strips.  Check BID. UAD       . VENTOLIN HFA 108 (90 BASE) MCG/ACT inhaler INHALE 2  PUFFS EVERY 3 HOURS AS NEEDED SHORTNESS OF BREATH AND WHEEZING  18 g  3  . atorvastatin (LIPITOR) 10 MG tablet Take 1 tablet (10 mg total) by mouth daily.  30 tablet  6  . losartan (COZAAR) 50 MG tablet Take 1 tablet (50 mg total) by mouth daily.  30 tablet  6  . VENTOLIN HFA 108 (90 BASE) MCG/ACT inhaler INHALE 2 PUFFS BY MOUTH EVERY 3 HOURS AS NEEDED  8.5 Inhaler  3    Review of Systems:   General: Denies fever, chills, sweats. No significant weight loss. Eyes: Denies blurring,significant itching ENT: Denies earache, sore throat, and hoarseness. Cardiovascular: Denies chest pains, palpitations, dyspnea on exertion Respiratory: Denies cough, dyspnea at rest,wheeezing Breast: no concerns about lumps GI: Denies nausea, vomiting, diarrhea, constipation, change in bowel habits, abdominal pain, melena, hematochezia GU: Denies penile discharge, ED, urinary flow / outflow problems. No STD concerns. Musculoskeletal: Denies back pain, joint pain Derm: Denies rash, itching Neuro: Denies  paresthesias, frequent falls, frequent headaches Psych: Denies depression, anxiety Endocrine: Denies cold intolerance, heat intolerance, polydipsia Heme: Denies enlarged lymph nodes Allergy: No hayfever  Physical Examination: Filed Vitals:   05/30/12 0951  BP: 128/76  Pulse: 88  Temp: 98.2 F (36.8 C)  TempSrc: Oral  Height: 5' 10.75" (1.797 m)  Weight: 214 lb (97.07 kg)    Body mass index is 30.06 kg/(m^2). Ideal Body Weight: Weight in (lb) to have BMI = 25: 177.6    Wt Readings from Last 3 Encounters:  05/30/12 214 lb (97.07 kg)  03/23/11 202 lb (91.627 kg)  07/31/10 197 lb (89.359 kg)    GEN: well developed, well nourished, no acute distress Eyes: conjunctiva and lids normal, PERRLA, EOMI ENT: TM clear, nares clear, oral exam WNL Neck: supple, no lymphadenopathy, no thyromegaly, no JVD Pulm: clear to auscultation  and percussion, respiratory effort normal CV: regular rate and rhythm, S1-S2, no murmur, rub or gallop, no bruits, peripheral pulses normal and symmetric, no cyanosis, clubbing, edema or varicosities Chest: no scars, masses GI: soft, non-tender; no hepatosplenomegaly, masses; active bowel sounds all quadrants GU: no hernia, testicular mass, penile discharge, or prostate enlargement Lymph: no cervical, axillary or inguinal adenopathy MSK: gait normal, muscle tone and strength WNL, no joint swelling, effusions, discoloration, crepitus  SKIN: clear, good turgor, color WNL, no rashes, lesions, or ulcerations Neuro: normal mental status, normal strength, sensation, and motion Psych: alert; oriented to person, place and time, normally interactive and not anxious or depressed in appearance.  Assessment and Plan:  1. Routine general medical examination at a health care facility : The patient's preventative maintenance and recommended screening tests for an annual wellness exam were reviewed in full today. Brought up to date unless services declined.  Counselled on the importance of diet, exercise, and its role in overall health and mortality. The patient's FH and SH was reviewed, including their home life, tobacco status, and drug and alcohol status.    2. DIABETES MELLITUS, TYPE II : stable, continue metformin er   3. HYPERLIPIDEMIA-MIXED : not at goal, start lipitor   4. HYPERTENSION, UNSPECIFIED : restart coreg and cozaar in a post mi patient   5. CAD, UNSPECIFIED SITE : f/u with cardiology for longitudinal care, titration of coreg for hf Ambulatory referral to Cardiology  6. TRANSIENT ISCHEMIC ATTACKS, HX OF  Ambulatory referral to Cardiology  7. CHF (congestive heart failure) : 2010 echo with ef of 45-50%. Understanding is minimal at this point. Ambulatory referral to Cardiology  8. Immunization due  Pneumococcal polysaccharide vaccine 23-valent greater than or equal to 2yo subcutaneous/IM  9.  History of noncompliance with medical treatment     >25 minutes spent in face to face time with patient, >50% spent in counselling or coordination of care: over and above CPX, spent in discussion of CAD, DM, CHF, HTN, Lipids, smoking, and risk factor reduction.  Orders Today:  Orders Placed This Encounter  Procedures  . Pneumococcal polysaccharide vaccine 23-valent greater than or equal to 2yo subcutaneous/IM  . Ambulatory referral to Cardiology    Referral Priority:  Routine    Referral Type:  Consultation    Referral Reason:  Specialty Services Required    Requested Specialty:  Cardiology    Number of Visits Requested:  1    Updated Medication List: (Includes new medications, updates to list, dose adjustments) Meds ordered this encounter  Medications  . carvedilol (COREG) 3.125 MG tablet    Sig: Take 1 tablet (3.125 mg total) by mouth 2 (two) times daily.    Dispense:  60 tablet    Refill:  3  . atorvastatin (LIPITOR) 10 MG tablet    Sig: Take 1 tablet (10 mg total) by mouth daily.    Dispense:  30 tablet    Refill:  6  . losartan (COZAAR) 50 MG tablet    Sig: Take 1 tablet (50 mg total) by mouth daily.    Dispense:  30 tablet    Refill:  6    Medications Discontinued: Medications Discontinued During This Encounter  Medication Reason  . VENTOLIN HFA 108 (90 BASE) MCG/ACT inhaler Duplicate  . carvedilol (COREG) 12.5 MG tablet Reorder  . atorvastatin (LIPITOR) 10 MG tablet Reorder  . losartan (COZAAR) 50 MG tablet Reorder     Tony Beat, MD

## 2012-06-09 ENCOUNTER — Encounter: Payer: Self-pay | Admitting: Cardiovascular Disease

## 2012-06-09 ENCOUNTER — Ambulatory Visit (INDEPENDENT_AMBULATORY_CARE_PROVIDER_SITE_OTHER): Payer: 59 | Admitting: Cardiovascular Disease

## 2012-06-09 VITALS — BP 138/78 | HR 94 | Ht 70.0 in | Wt 216.5 lb

## 2012-06-09 DIAGNOSIS — I2510 Atherosclerotic heart disease of native coronary artery without angina pectoris: Secondary | ICD-10-CM | POA: Insufficient documentation

## 2012-06-09 DIAGNOSIS — R0600 Dyspnea, unspecified: Secondary | ICD-10-CM

## 2012-06-09 DIAGNOSIS — I714 Abdominal aortic aneurysm, without rupture: Secondary | ICD-10-CM

## 2012-06-09 DIAGNOSIS — F17200 Nicotine dependence, unspecified, uncomplicated: Secondary | ICD-10-CM

## 2012-06-09 DIAGNOSIS — E785 Hyperlipidemia, unspecified: Secondary | ICD-10-CM

## 2012-06-09 DIAGNOSIS — I10 Essential (primary) hypertension: Secondary | ICD-10-CM

## 2012-06-09 DIAGNOSIS — I119 Hypertensive heart disease without heart failure: Secondary | ICD-10-CM | POA: Insufficient documentation

## 2012-06-09 NOTE — Assessment & Plan Note (Signed)
His blood pressure seems to be controlled when he takes his medications.

## 2012-06-09 NOTE — Patient Instructions (Addendum)
Your physician has requested that you have en exercise stress myoview. For further information please visit [email or web address] Please follow instruction sheet, as given.  Your physician has requested that you have an abdominal aorta duplex. During this test, an ultrasound is used to evaluate the aorta. Allow 30 minutes for this exam. Do not eat after midnight the day before and avoid carbonated beverages  Follow up in 6 months.

## 2012-06-09 NOTE — Assessment & Plan Note (Signed)
The patient has known history of coronary artery disease with previous inferior myocardial infarction. His current symptoms include exertional dyspnea without chest pain. I recommend further evaluation with a treadmill nuclear stress test. His ECG is abnormal with right bundle branch block and frequent PVCs. A treadmill stress test alone would not be interpretable. I had a prolonged discussion with the patient about the importance of taking his medications on a regular basis. Continue treatment with carvedilol and losartan.

## 2012-06-09 NOTE — Assessment & Plan Note (Signed)
Lab Results  Component Value Date   CHOL 145 05/24/2012   HDL 28.00* 05/24/2012   LDLCALC 59 03/17/2011   LDLDIRECT 90.7 05/24/2012   TRIG 205.0* 05/24/2012   CHOLHDL 5 05/24/2012   The patient was not taking atorvastatin and his cholesterol was checked. Since then, he has been taking the medication regularly. Thus, I recommend repeat lipid profile in few months. Target LDL is less than 70.

## 2012-06-09 NOTE — Progress Notes (Signed)
HPI  This is a 61 year old male who is here today to establish cardiovascular care. He was last seen by Dr. Shirlee Latch in May of 2010. No cardiac followup since that time. He has known history of coronary artery disease status post inferior myocardial infarction in 1999 treated with PCI to the mid RCA and recurrent TIAs with surgical PFO closure in 2004. He has multiple other medical conditions that include hyperlipidemia, hypertension and diabetes. Unfortunately, the patient has compliance issues with medications. He asked he was not taking any of his medications for at least a year and resumed recently. He denies any chest pain. However, he complains of dyspnea with moderate activities. He attributes that to continuous smoking. Most recent stress test was in 2008 which showed evidence of inferior infarct without significant ischemia. Ejection fraction was 42%.  No Known Allergies   Current Outpatient Prescriptions on File Prior to Visit  Medication Sig Dispense Refill  . aspirin 81 MG EC tablet Take 81 mg by mouth daily.        Marland Kitchen atorvastatin (LIPITOR) 10 MG tablet Take 1 tablet (10 mg total) by mouth daily.  30 tablet  6  . Blood Glucose Monitoring Suppl MISC 1 strip by Other route 2 (two) times daily. Please supply patient with Glucose Test Strips that will work with monitor.  100 each  3  . budesonide-formoterol (SYMBICORT) 160-4.5 MCG/ACT inhaler Inhale 2 puffs into the lungs 2 (two) times daily.  1 Inhaler  11  . carvedilol (COREG) 3.125 MG tablet Take 1 tablet (3.125 mg total) by mouth 2 (two) times daily.  60 tablet  3  . ipratropium-albuterol (DUONEB) 0.5-2.5 (3) MG/3ML SOLN USE 1 VIAL VIA NEBULIZER EVERY 4 HOURS AS NEEDED  360 mL  0  . losartan (COZAAR) 50 MG tablet Take 1 tablet (50 mg total) by mouth daily.  30 tablet  6  . metFORMIN (GLUCOPHAGE-XR) 500 MG 24 hr tablet TAKE 1 TABLET BY MOUTH ONCE A DAY  90 tablet  2  . NON FORMULARY Glucometer lancets and strips. Check BID. UAD         . VENTOLIN HFA 108 (90 BASE) MCG/ACT inhaler INHALE 2 PUFFS EVERY 3 HOURS AS NEEDED SHORTNESS OF BREATH AND WHEEZING  18 g  3  . DISCONTD: ipratropium-albuterol (DUONEB) 0.5-2.5 (3) MG/3ML SOLN USE 1 VIAL VIA NEBULIZER EVERY 4 HOURS AS NEEDED  360 mL  0  . DISCONTD: ipratropium-albuterol (DUONEB) 0.5-2.5 (3) MG/3ML SOLN USE 1 VIAL VIA NEBULIZER EVERY 4 HOURS AS NEEDED  3 mL  0     Past Medical History  Diagnosis Date  . Ischemic cardiomyopathy     mild with EF 42% on myoviews 2008.  Marland Kitchen TIA (transient ischemic attack)     hx; now s/p PFO closure 2004 (in [place])  . DM2 (diabetes mellitus, type 2)     A1c 6.0% 12/11  . Smoker   . Diverticulosis 2002  . GERD (gastroesophageal reflux disease)   . Ulcer   . Generalized headaches   . Medical non-compliance   . Nicotine dependence     chronic, active  . COPD (chronic obstructive pulmonary disease)   . Seasonal allergic rhinitis   . CAD (coronary artery disease)     inferior MI 11/99 (in [place]) with PCI to [facility] last LHC 2004 [facility]): EF 50%, patent RCA stent. no obstructive disease. last myoview 2008: EF 42%, inferior infarct, no ischemia.   . 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 HTN (hypertension)   . Hyperlipidemia      Past Surgical History  Procedure Date  . Open heart surgery 2004    PFO repair  . Cardiac catheterization 11/99    CI per PMH     Family History  Problem Relation Age of Onset  . Coronary artery disease      family hx  . Breast cancer      1st egree relative <50     History   Social History  . Marital Status: Married    Spouse Name: N/A    Number of Children: N/A  . Years of Education: N/A   Occupational History  . Not on file.   Social History Main Topics  . Smoking status: Current Every Day Smoker -- 1.0 packs/day for 45 years    Types: Cigarettes  . Smokeless tobacco: Never Used   Comment: 1 ppd +40 years  . Alcohol Use: Yes     beer weekly  . Drug  Use: No  . Sexually Active: Not on file   Other Topics Concern  . Not on file   Social History Narrative   Married, 1 daughter. 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 Lives in [place], [place]     ROS Constitutional: Negative for fever, chills, diaphoresis, activity change, appetite change and fatigue.  HENT: Negative for hearing loss, nosebleeds, congestion, sore throat, facial swelling, drooling, trouble swallowing, neck pain, voice change, sinus pressure and tinnitus.  Eyes: Negative for photophobia, pain, discharge and visual disturbance.  Respiratory: Negative for apnea, cough, chest tightness and wheezing.  Cardiovascular: Negative for chest pain, palpitations and leg swelling.  Gastrointestinal: Negative for nausea, vomiting, abdominal pain, diarrhea, constipation, blood in stool and abdominal distention.  Genitourinary: Negative for dysuria, urgency, frequency, hematuria and decreased urine volume.  Musculoskeletal: Negative for myalgias, back pain, joint swelling, arthralgias and gait problem.  Skin: Negative for color change, pallor, rash and wound.  Neurological: Negative for dizziness, tremors, seizures, syncope, speech difficulty, weakness, light-headedness, numbness and headaches.  Psychiatric/Behavioral: Negative for suicidal ideas, hallucinations, behavioral problems and agitation. The patient is not nervous/anxious.     PHYSICAL EXAM   BP 138/78  Pulse 94  Ht 5\' 10"  (1.778 m)  Wt 216 lb 8 oz (98.204 kg)  BMI 31.06 kg/m2 Constitutional: He is oriented to person, place, and time. He appears well-developed and well-nourished. No distress.  HENT: No nasal discharge.  Head: Normocephalic and atraumatic.  Eyes: Pupils are equal and round. Right eye exhibits no discharge. Left eye exhibits no discharge.  Neck: Normal range of motion. Neck supple. No JVD present. No thyromegaly present.  Cardiovascular: Normal rate, regular rhythm, normal heart sounds  and. Exam reveals no gallop and no friction rub. No murmur heard.  Pulmonary/Chest: Effort normal and breath sounds normal. No stridor. No respiratory distress. He has no wheezes. He has no rales. He exhibits no tenderness.  Abdominal: Soft. Bowel sounds are normal. He exhibits no distension. There is no tenderness. There is no rebound and no guarding.  Musculoskeletal: Normal range of motion. He exhibits no edema and no tenderness.  Neurological: He is alert and oriented to person, place, and time. Coordination normal.  Skin: Skin is warm and dry. No rash noted. He is not diaphoretic. No erythema. No pallor.  Psychiatric: He has a normal mood and affect. His behavior is normal. Judgment and thought content normal.       EKG: Sinus  Rhythm  -  frequent multiform ectopic ventricular beats  # VECs = 3, # types 2 -Right bundle branch block and right axis -possible right ventricular hypertrophy  -consider pulmonary disease.   ABNORMAL    ASSESSMENT AND PLAN

## 2012-06-09 NOTE — Assessment & Plan Note (Signed)
I had a prolonged discussion with the patient about smoking cessation. He reports previous side effects with Chantix. I suggested nicotine replacement therapy. Given his age and smoking status, I recommend a one-time screening for abdominal aortic aneurysm with a duplex ultrasound which I ordered today.

## 2012-06-16 ENCOUNTER — Ambulatory Visit: Payer: Self-pay | Admitting: Cardiovascular Disease

## 2012-06-17 ENCOUNTER — Telehealth: Payer: Self-pay

## 2012-06-17 DIAGNOSIS — R079 Chest pain, unspecified: Secondary | ICD-10-CM

## 2012-06-17 NOTE — Telephone Encounter (Signed)
Pt informed. Understanding verb I offered to go ahead and schedule ABI He is going to talk with wife and call back Monday am to schedule

## 2012-06-17 NOTE — Telephone Encounter (Signed)
Message copied by Marcelle Overlie on Fri Jun 17, 2012  2:15 PM ------      Message from: Lorine Bears A      Created: Fri Jun 17, 2012  2:11 PM      Regarding: stress test       Inform him that stress test was overall unchanged from before. He did have leg pain on the treadmill. Schedule him for ABI and follow up with me after that.

## 2012-06-20 NOTE — Telephone Encounter (Signed)
LMTCB

## 2012-06-21 ENCOUNTER — Other Ambulatory Visit: Payer: Self-pay

## 2012-06-21 DIAGNOSIS — M79606 Pain in leg, unspecified: Secondary | ICD-10-CM

## 2012-06-21 NOTE — Telephone Encounter (Signed)
Pt needs to schedule ABI

## 2012-06-24 ENCOUNTER — Telehealth: Payer: Self-pay

## 2012-06-24 NOTE — Telephone Encounter (Signed)
This pt needs ABI scheduled, please thanks

## 2012-06-27 ENCOUNTER — Other Ambulatory Visit: Payer: Self-pay | Admitting: Family Medicine

## 2012-06-30 ENCOUNTER — Other Ambulatory Visit: Payer: Self-pay | Admitting: Family Medicine

## 2012-07-01 ENCOUNTER — Other Ambulatory Visit: Payer: Self-pay | Admitting: Cardiovascular Disease

## 2012-07-01 DIAGNOSIS — I2510 Atherosclerotic heart disease of native coronary artery without angina pectoris: Secondary | ICD-10-CM

## 2012-07-01 DIAGNOSIS — R0600 Dyspnea, unspecified: Secondary | ICD-10-CM

## 2012-07-26 ENCOUNTER — Other Ambulatory Visit: Payer: Self-pay | Admitting: Family Medicine

## 2012-08-29 ENCOUNTER — Other Ambulatory Visit: Payer: Self-pay | Admitting: Family Medicine

## 2012-08-30 ENCOUNTER — Other Ambulatory Visit: Payer: Self-pay | Admitting: Family Medicine

## 2012-09-26 ENCOUNTER — Other Ambulatory Visit: Payer: Self-pay | Admitting: Family Medicine

## 2012-10-21 ENCOUNTER — Other Ambulatory Visit: Payer: Self-pay | Admitting: Family Medicine

## 2012-11-03 ENCOUNTER — Other Ambulatory Visit: Payer: Self-pay | Admitting: Family Medicine

## 2012-11-11 ENCOUNTER — Other Ambulatory Visit: Payer: Self-pay | Admitting: Family Medicine

## 2012-11-16 ENCOUNTER — Other Ambulatory Visit: Payer: Self-pay | Admitting: Family Medicine

## 2012-12-13 ENCOUNTER — Other Ambulatory Visit: Payer: Self-pay | Admitting: Family Medicine

## 2012-12-14 ENCOUNTER — Other Ambulatory Visit: Payer: Self-pay | Admitting: Family Medicine

## 2012-12-14 NOTE — Telephone Encounter (Signed)
Spoke with Tony Watson at [facility] gave x 3 refills over phone.

## 2013-01-03 ENCOUNTER — Telehealth: Payer: Self-pay | Admitting: *Deleted

## 2013-01-03 NOTE — Telephone Encounter (Signed)
Lmtcb to see if pt had ABI and aaa duplex. I do not see that pt had study done here possibly somewhere else?

## 2013-01-06 ENCOUNTER — Other Ambulatory Visit: Payer: Self-pay | Admitting: Family Medicine

## 2013-01-06 ENCOUNTER — Other Ambulatory Visit: Payer: Self-pay | Admitting: *Deleted

## 2013-01-06 MED ORDER — IPRATROPIUM-ALBUTEROL 0.5-2.5 (3) MG/3ML IN SOLN: RESPIRATORY_TRACT | Status: DC

## 2013-01-09 ENCOUNTER — Ambulatory Visit: Payer: 59 | Admitting: Cardiovascular Disease

## 2013-02-05 ENCOUNTER — Inpatient Hospital Stay: Payer: Self-pay | Admitting: Student

## 2013-02-05 LAB — COMPREHENSIVE METABOLIC PANEL
Albumin: 4.6 g/dL (ref 3.4–5.0)
Alkaline Phosphatase: 46 U/L — ABNORMAL LOW (ref 50–136)
Anion Gap: 8 (ref 7–16)
BUN: 18 mg/dL (ref 7–18)
Bilirubin,Total: 0.6 mg/dL (ref 0.2–1.0)
Calcium, Total: 9.1 mg/dL (ref 8.5–10.1)
Creatinine: 1.26 mg/dL (ref 0.60–1.30)
SGOT(AST): 42 U/L — ABNORMAL HIGH (ref 15–37)
SGPT (ALT): 51 U/L (ref 12–78)
Sodium: 136 mmol/L (ref 136–145)

## 2013-02-05 LAB — URINALYSIS, COMPLETE
Bacteria: NONE SEEN
Bilirubin,UR: NEGATIVE
Ketone: NEGATIVE
Leukocyte Esterase: NEGATIVE
Ph: 5 (ref 4.5–8.0)
Specific Gravity: 1.026 (ref 1.003–1.030)
Squamous Epithelial: <1
WBC UR: 1 /HPF (ref 0–5)

## 2013-02-05 LAB — CBC
HGB: 16.6 g/dL (ref 13.0–18.0)
MCH: 31.2 pg (ref 26.0–34.0)
MCHC: 35 g/dL (ref 32.0–36.0)
RBC: 5.32 10*6/uL (ref 4.40–5.90)
WBC: 14.1 10*3/uL — ABNORMAL HIGH (ref 3.8–10.6)

## 2013-02-06 LAB — LIPID PANEL
Ldl Cholesterol, Calc: 54 mg/dL (ref 0–100)
Triglycerides: 75 mg/dL (ref 0–200)
VLDL Cholesterol, Calc: 15 mg/dL (ref 5–40)

## 2013-02-06 LAB — CBC WITH DIFFERENTIAL/PLATELET
Comment - H1-Com1: NORMAL
HCT: 42.2 % (ref 40.0–52.0)
Lymphocytes: 5 %
Monocytes: 10 %
Platelet: 116 10*3/uL — ABNORMAL LOW (ref 150–440)
RBC: 4.71 10*6/uL (ref 4.40–5.90)
RDW: 14.1 % (ref 11.5–14.5)
WBC: 18.6 10*3/uL — ABNORMAL HIGH (ref 3.8–10.6)

## 2013-02-06 LAB — BASIC METABOLIC PANEL
Anion Gap: 9 (ref 7–16)
Calcium, Total: 8.3 mg/dL — ABNORMAL LOW (ref 8.5–10.1)
Chloride: 100 mmol/L (ref 98–107)
EGFR (African American): >60
EGFR (Non-African Amer.): >60
Osmolality: 268 (ref 275–301)
Sodium: 133 mmol/L — ABNORMAL LOW (ref 136–145)

## 2013-02-06 LAB — MAGNESIUM: Magnesium: 1.3 mg/dL — ABNORMAL LOW

## 2013-02-06 LAB — HEMOGLOBIN A1C: Hemoglobin A1C: 6.5 % — ABNORMAL HIGH (ref 4.2–6.3)

## 2013-02-07 LAB — BASIC METABOLIC PANEL
Anion Gap: 7 (ref 7–16)
BUN: 16 mg/dL (ref 7–18)
Co2: 26 mmol/L (ref 21–32)
Creatinine: 1.14 mg/dL (ref 0.60–1.30)
EGFR (African American): >60
EGFR (Non-African Amer.): >60

## 2013-02-07 LAB — CBC WITH DIFFERENTIAL/PLATELET
Bands: 25 %
Comment - H1-Com2: NORMAL
HCT: 43.8 % (ref 40.0–52.0)
HGB: 15.2 g/dL (ref 13.0–18.0)
Lymphocytes: 15 %
MCH: 30.7 pg (ref 26.0–34.0)
MCHC: 34.7 g/dL (ref 32.0–36.0)
MCV: 89 fL (ref 80–100)
Metamyelocyte: 1 %
Monocytes: 12 %
RBC: 4.95 10*6/uL (ref 4.40–5.90)
RDW: 14.1 % (ref 11.5–14.5)
Segmented Neutrophils: 47 %

## 2013-02-07 LAB — POTASSIUM: Potassium: 3.1 mmol/L — ABNORMAL LOW (ref 3.5–5.1)

## 2013-02-08 LAB — BASIC METABOLIC PANEL
Anion Gap: 10 (ref 7–16)
BUN: 16 mg/dL (ref 7–18)
Calcium, Total: 7.7 mg/dL — ABNORMAL LOW (ref 8.5–10.1)
Creatinine: 0.98 mg/dL (ref 0.60–1.30)
EGFR (African American): >60
EGFR (Non-African Amer.): >60
Osmolality: 274 (ref 275–301)
Potassium: 3.1 mmol/L — ABNORMAL LOW (ref 3.5–5.1)
Sodium: 136 mmol/L (ref 136–145)

## 2013-02-08 LAB — CBC WITH DIFFERENTIAL/PLATELET
Bands: 18 %
HGB: 13.4 g/dL (ref 13.0–18.0)
MCH: 31 pg (ref 26.0–34.0)
MCHC: 35.3 g/dL (ref 32.0–36.0)
MCV: 88 fL (ref 80–100)
Metamyelocyte: 1 %
Monocytes: 22 %
Platelet: 97 10*3/uL — ABNORMAL LOW (ref 150–440)
RBC: 4.33 10*6/uL — ABNORMAL LOW (ref 4.40–5.90)
RDW: 14 % (ref 11.5–14.5)
Segmented Neutrophils: 46 %
WBC: 12.2 10*3/uL — ABNORMAL HIGH (ref 3.8–10.6)

## 2013-02-09 LAB — CBC WITH DIFFERENTIAL/PLATELET
Basophil %: 0.2 %
Eosinophil %: 0.2 %
HCT: 33.8 % — ABNORMAL LOW (ref 40.0–52.0)
HGB: 12.2 g/dL — ABNORMAL LOW (ref 13.0–18.0)
Lymphocyte %: 17.1 %
MCH: 31.1 pg (ref 26.0–34.0)
MCHC: 36.1 g/dL — ABNORMAL HIGH (ref 32.0–36.0)
MCV: 86 fL (ref 80–100)
Neutrophil #: 3.7 10*3/uL (ref 1.4–6.5)
Neutrophil %: 49.9 %
Platelet: 98 10*3/uL — ABNORMAL LOW (ref 150–440)
RDW: 14 % (ref 11.5–14.5)
WBC: 7.5 10*3/uL (ref 3.8–10.6)

## 2013-02-09 LAB — BASIC METABOLIC PANEL
Anion Gap: 6 — ABNORMAL LOW (ref 7–16)
BUN: 11 mg/dL (ref 7–18)
Chloride: 106 mmol/L (ref 98–107)
Co2: 26 mmol/L (ref 21–32)
Creatinine: 0.79 mg/dL (ref 0.60–1.30)
Glucose: 127 mg/dL — ABNORMAL HIGH (ref 65–99)
Potassium: 3.2 mmol/L — ABNORMAL LOW (ref 3.5–5.1)
Sodium: 138 mmol/L (ref 136–145)

## 2013-02-10 LAB — HEMOGLOBIN: HGB: 12.1 g/dL — ABNORMAL LOW (ref 13.0–18.0)

## 2013-02-10 LAB — MAGNESIUM: Magnesium: 1.9 mg/dL

## 2013-02-11 LAB — CULTURE, BLOOD (SINGLE)

## 2013-03-09 LAB — STOOL CULTURE

## 2013-03-31 ENCOUNTER — Other Ambulatory Visit: Payer: Self-pay | Admitting: Family Medicine

## 2013-04-20 ENCOUNTER — Other Ambulatory Visit: Payer: Self-pay | Admitting: Family Medicine

## 2013-05-11 ENCOUNTER — Other Ambulatory Visit: Payer: Self-pay | Admitting: Family Medicine

## 2013-06-05 ENCOUNTER — Other Ambulatory Visit: Payer: Self-pay | Admitting: Family Medicine

## 2013-06-09 ENCOUNTER — Other Ambulatory Visit: Payer: Self-pay | Admitting: Family Medicine

## 2013-06-09 NOTE — Telephone Encounter (Signed)
Pt called to ck on status of refill for duoneb; spoke with lisa at pharmacy and rx will be ready in 2 hours. Pt notified.

## 2013-10-09 ENCOUNTER — Other Ambulatory Visit: Payer: Self-pay | Admitting: Family Medicine

## 2013-10-12 ENCOUNTER — Ambulatory Visit (INDEPENDENT_AMBULATORY_CARE_PROVIDER_SITE_OTHER): Payer: 59 | Admitting: Family Medicine

## 2013-10-12 VITALS — BP 140/76 | HR 100 | Temp 98.0°F | Ht 70.5 in | Wt 210.8 lb

## 2013-10-12 DIAGNOSIS — I798 Other disorders of arteries, arterioles and capillaries in diseases classified elsewhere: Secondary | ICD-10-CM

## 2013-10-12 DIAGNOSIS — Z125 Encounter for screening for malignant neoplasm of prostate: Secondary | ICD-10-CM

## 2013-10-12 DIAGNOSIS — E785 Hyperlipidemia, unspecified: Secondary | ICD-10-CM

## 2013-10-12 DIAGNOSIS — E1159 Type 2 diabetes mellitus with other circulatory complications: Secondary | ICD-10-CM

## 2013-10-12 DIAGNOSIS — Z23 Encounter for immunization: Secondary | ICD-10-CM

## 2013-10-12 DIAGNOSIS — F17200 Nicotine dependence, unspecified, uncomplicated: Secondary | ICD-10-CM

## 2013-10-12 DIAGNOSIS — I252 Old myocardial infarction: Secondary | ICD-10-CM

## 2013-10-12 DIAGNOSIS — Z0000 Encounter for general adult medical examination without abnormal findings: Secondary | ICD-10-CM

## 2013-10-12 DIAGNOSIS — Z720 Tobacco use: Secondary | ICD-10-CM

## 2013-10-12 DIAGNOSIS — Z79899 Other long term (current) drug therapy: Secondary | ICD-10-CM

## 2013-10-12 LAB — CBC WITH DIFFERENTIAL/PLATELET
Basophils Absolute: 0 10*3/uL (ref 0.0–0.1)
Basophils Relative: 0.5 % (ref 0.0–3.0)
EOS PCT: 1 % (ref 0.0–5.0)
Eosinophils Absolute: 0 10*3/uL (ref 0.0–0.7)
HEMATOCRIT: 40.9 % (ref 39.0–52.0)
Hemoglobin: 13.6 g/dL (ref 13.0–17.0)
LYMPHS ABS: 2.2 10*3/uL (ref 0.7–4.0)
Lymphocytes Relative: 45.3 % (ref 12.0–46.0)
MCHC: 33.3 g/dL (ref 30.0–36.0)
MCV: 93.5 fl (ref 78.0–100.0)
Monocytes Absolute: 1.3 10*3/uL — ABNORMAL HIGH (ref 0.1–1.0)
Monocytes Relative: 27.5 % — ABNORMAL HIGH (ref 3.0–12.0)
Neutro Abs: 1.2 10*3/uL — ABNORMAL LOW (ref 1.4–7.7)
Neutrophils Relative %: 25.7 % — ABNORMAL LOW (ref 43.0–77.0)
PLATELETS: 158 10*3/uL (ref 150.0–400.0)
RBC: 4.37 Mil/uL (ref 4.22–5.81)
RDW: 14.1 % (ref 11.5–14.6)
WBC: 4.8 10*3/uL (ref 4.5–10.5)

## 2013-10-12 LAB — BASIC METABOLIC PANEL
BUN: 13 mg/dL (ref 6–23)
CALCIUM: 9.7 mg/dL (ref 8.4–10.5)
CO2: 27 mEq/L (ref 19–32)
Chloride: 103 mEq/L (ref 96–112)
Creatinine, Ser: 0.9 mg/dL (ref 0.4–1.5)
GFR: 87.56 mL/min (ref >60.00)
GLUCOSE: 111 mg/dL — AB (ref 70–99)
Potassium: 4.3 mEq/L (ref 3.5–5.1)
SODIUM: 138 meq/L (ref 135–145)

## 2013-10-12 LAB — HEPATIC FUNCTION PANEL
ALK PHOS: 35 U/L — AB (ref 39–117)
ALT: 45 U/L (ref 0–53)
AST: 44 U/L — ABNORMAL HIGH (ref 0–37)
Albumin: 4.2 g/dL (ref 3.5–5.2)
Bilirubin, Direct: 0 mg/dL (ref 0.0–0.3)
TOTAL PROTEIN: 7.8 g/dL (ref 6.0–8.3)
Total Bilirubin: 0.8 mg/dL (ref 0.3–1.2)

## 2013-10-12 LAB — MICROALBUMIN / CREATININE URINE RATIO
CREATININE, U: 58.2 mg/dL
MICROALB UR: 0.4 mg/dL (ref 0.0–1.9)
Microalb Creat Ratio: 0.7 mg/g (ref 0.0–30.0)

## 2013-10-12 LAB — PSA: PSA: 0.2 ng/mL (ref 0.10–4.00)

## 2013-10-12 LAB — LDL CHOLESTEROL, DIRECT: Direct LDL: 78 mg/dL

## 2013-10-12 LAB — HEMOGLOBIN A1C: HEMOGLOBIN A1C: 7.2 % — AB (ref 4.6–6.5)

## 2013-10-12 MED ORDER — LOSARTAN POTASSIUM 50 MG PO TABS: 50.0000 mg | ORAL_TABLET | Freq: Every day | ORAL | Status: DC

## 2013-10-12 MED ORDER — ATORVASTATIN CALCIUM 10 MG PO TABS: 10.0000 mg | ORAL_TABLET | Freq: Every day | ORAL | Status: DC

## 2013-10-12 MED ORDER — CARVEDILOL 3.125 MG PO TABS: 3.1250 mg | ORAL_TABLET | Freq: Two times a day (BID) | ORAL | Status: DC

## 2013-10-12 MED ORDER — ALBUTEROL SULFATE HFA 108 (90 BASE) MCG/ACT IN AERS: 2.0000 | INHALATION_SPRAY | RESPIRATORY_TRACT | Status: DC | PRN

## 2013-10-12 MED ORDER — BUDESONIDE-FORMOTEROL FUMARATE 160-4.5 MCG/ACT IN AERO: 2.0000 | INHALATION_SPRAY | Freq: Two times a day (BID) | RESPIRATORY_TRACT | Status: DC

## 2013-10-12 MED ORDER — IPRATROPIUM-ALBUTEROL 0.5-2.5 (3) MG/3ML IN SOLN: 3.0000 mL | RESPIRATORY_TRACT | Status: DC | PRN

## 2013-10-12 MED ORDER — METFORMIN HCL ER 500 MG PO TB24: ORAL_TABLET | ORAL | Status: DC

## 2013-10-12 NOTE — Progress Notes (Signed)
Date:  10/12/2013   Name:  Tony Watson   DOB:  05/25/1951   MRN:  [id number] Gender: male Age: 63 y.o.  Primary Physician:  Owens Loffler, MD   Chief Complaint: Medication Refill   Subjective:   History of Present Illness:  Tony Watson is a 63 y.o. pleasant patient who presents with the following:  Preventative Health Maintenance Visit:  History of significant medical noncompliance with a history of MI, TIA, DM, HLD, HTN, Smoker, COPD. He stopped all of his cardiac medications, and he has only been taking his metformin for some time.  Health Maintenance Summary Reviewed and updated, unless pt declines services.  Tobacco History Reviewed. Smoker Alcohol: No concerns, no excessive use Exercise Habits: None. STD concerns: no risk or activity to increase risk Drug Use: None Encouraged self-testicular check  Immunization History  Administered Date(s) Administered  . Influenza Split 05/13/2012, 06/06/2013  . Pneumococcal Conjugate-13 10/12/2013  . Pneumococcal Polysaccharide-23 05/30/2012   Diabetes Mellitus: Tolerating Medications: yes Compliance with diet: fair Exercise: minimal / intermittent Avg blood sugars at home: not checking Foot problems: none Hypoglycemia: none No nausea, vomitting, blurred vision, polyuria.  Lab Results  Component Value Date   HGBA1C 7.2* 10/12/2013   HGBA1C 6.7* 05/24/2012   HGBA1C 6.3 03/17/2011   Lab Results  Component Value Date   MICROALBUR 0.4 10/12/2013   LDLCALC 59 03/17/2011   CREATININE 0.9 10/12/2013    Wt Readings from Last 3 Encounters:  10/12/13 210 lb 12 oz (95.596 kg)  06/09/12 216 lb 8 oz (98.204 kg)  05/30/12 214 lb (97.07 kg)    Body mass index is 29.8 kg/(m^2).   Patient Active Problem List   Diagnosis Date Noted  . History of inferior MI (myocardial infarction) 10/14/2013  . CAD (coronary artery disease)   . HTN (hypertension)   . History of noncompliance with medical treatment 05/30/2012  . 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 COPD, severe 03/23/2011  . ALLERGIC RHINITIS CAUSE UNSPECIFIED 07/31/2010  . Type 2 diabetes mellitus with vascular disease 01/02/2010  . TOBACCO USE 04/17/2009  . PARESTHESIA 04/17/2009  . TRANSIENT ISCHEMIC ATTACKS, HX OF 04/17/2009  . HYPERLIPIDEMIA-MIXED 12/19/2008  . HYPERTENSION, UNSPECIFIED 12/19/2008    Past Medical History  Diagnosis Date  . Ischemic cardiomyopathy     mild with EF 42% on myoviews 2008.  Marland Kitchen TIA (transient ischemic attack)     hx; now s/p PFO closure 2004 (in [place])  . DM2 (diabetes mellitus, type 2)     A1c 6.0% 12/11  . Smoker   . Diverticulosis 2002  . GERD (gastroesophageal reflux disease)   . Ulcer   . Generalized headaches   . Medical non-compliance   . Nicotine dependence     chronic, active  . COPD (chronic obstructive pulmonary disease)   . Seasonal allergic rhinitis   . CAD (coronary artery disease)     inferior MI 11/99 (in [place]) with PCI to [facility]. last [place] 2004 [facility]): EF 50%, patent RCA stent. no obstructive disease. last myoview 2008: EF 42%, inferior infarct, no ischemia.   . Systolic heart failure secondary to coronary artery disease 05/30/2012  . HTN (hypertension)   . Hyperlipidemia     Past Surgical History  Procedure Laterality Date  . Open heart surgery  2004    PFO repair  . Cardiac catheterization  11/99    CI per PMH    History   Social History  . Marital Status: Married  Spouse Name: N/A    Number of Children: N/A  . Years of Education: N/A   Occupational History  . Not on file.   Social History Main Topics  . Smoking status: Current Every Day Smoker -- 1.00 packs/day for 45 years    Types: Cigarettes  . Smokeless tobacco: Never Used     Comment: 1 ppd +40 years  . Alcohol Use: Yes     Comment: beer weekly  . Drug Use: No  . Sexual Activity: Not on file   Other Topics Concern  . Not on file   Social History Narrative   Married, 1  daughter. 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 Lives in [place], [place]    Family History  Problem Relation Age of Onset  . Coronary artery disease      family hx  . Breast cancer      1st egree relative <50    No Known Allergies  Medication list has been reviewed and updated.  Review of Systems:  General: Denies fever, chills, sweats. No significant weight loss. Eyes: Denies blurring,significant itching ENT: Denies earache, sore throat, and hoarseness. Cardiovascular: Denies chest pains, palpitations, dyspnea on exertion Respiratory: Denies cough, dyspnea at rest,wheeezing Breast: no concerns about lumps GI: Denies nausea, vomiting, diarrhea, constipation, change in bowel habits, abdominal pain, melena, hematochezia GU: Denies penile discharge, ED, urinary flow / outflow problems. No STD concerns. Musculoskeletal: Denies back pain, joint pain Derm: Denies rash, itching Neuro: Denies  paresthesias, frequent falls, frequent headaches Psych: Denies depression, anxiety Endocrine: Denies cold intolerance, heat intolerance, polydipsia Heme: Denies enlarged lymph nodes Allergy: No hayfever  Objective:   Physical Examination: BP 140/76  Pulse 100  Temp(Src) 98 F (36.7 C) (Oral)  Ht 5' 10.5" (1.791 m)  Wt 210 lb 12 oz (95.596 kg)  BMI 29.80 kg/m2  SpO2 95% Ideal Body Weight: Weight in (lb) to have BMI = 25: 176.4  GEN: well developed, well nourished, no acute distress Eyes: conjunctiva and lids normal, PERRLA, EOMI ENT: TM clear, nares clear, oral exam WNL Neck: supple, no lymphadenopathy, no thyromegaly, no JVD Pulm: clear to auscultation and percussion, respiratory effort normal CV: regular rate and rhythm, S1-S2, no murmur, rub or gallop, no bruits, peripheral pulses normal and symmetric, no cyanosis, clubbing, edema or varicosities GI: soft, non-tender; no hepatosplenomegaly, masses; active bowel sounds all quadrants GU: decline Lymph: no  cervical, axillary or inguinal adenopathy MSK: gait normal, muscle tone and strength WNL, no joint swelling, effusions, discoloration, crepitus  SKIN: clear, good turgor, color WNL, no rashes, lesions, or ulcerations Neuro: normal mental status, normal strength, sensation, and motion Psych: alert; oriented to person, place and time, normally interactive and not anxious or depressed in appearance.  All labs reviewed with patient.  Lipids: LDL 78  CBC:    Component Value Date/Time   WBC 4.8 10/12/2013 1130   HGB 13.6 10/12/2013 1130   HCT 40.9 10/12/2013 1130   PLT 158.0 10/12/2013 1130   MCV 93.5 10/12/2013 1130   NEUTROABS 1.2* 10/12/2013 1130   LYMPHSABS 2.2 10/12/2013 1130   MONOABS 1.3* 10/12/2013 1130   EOSABS 0.0 10/12/2013 1130   BASOSABS 0.0 10/12/2013 7829    Basic Metabolic Panel:    Component Value Date/Time   NA 138 10/12/2013 1130   K 4.3 10/12/2013 1130   CL 103 10/12/2013 1130   CO2 27 10/12/2013 1130   BUN 13 10/12/2013 1130   CREATININE 0.9 10/12/2013 1130   GLUCOSE 111*  10/12/2013 1130   GLUCOSE 117 07/16/2010 1526   CALCIUM 9.7 10/12/2013 1130    Lab Results  Component Value Date   ALT 45 10/12/2013   AST 44* 10/12/2013   ALKPHOS 35* 10/12/2013   BILITOT 0.8 10/12/2013    Lab Results  Component Value Date   TSH 1.85 05/24/2012    Lab Results  Component Value Date   PSA 0.18 10/13/2013   PSA 0.20 10/12/2013   PSA 0.18 03/17/2011    Assessment & Plan:   Health Maintenance Exam: The patient's preventative maintenance and recommended screening tests for an annual wellness exam were reviewed in full today. Brought up to date unless services declined.  Counselled on the importance of diet, exercise, and its role in overall health and mortality. The patient's FH and SH was reviewed, including their home life, tobacco status, and drug and alcohol status.  Very noncompliant. I tried to be frank with the patient and recommended he resume cardiac and chf meds.   5 minutes spent in  counselling: The patient does smoke cigarettes, and we discussed that tobacco is harmful to one's overall health, and I made the recommendation to quit tobacco products.   New Prescriptions   ATORVASTATIN (LIPITOR) 10 MG TABLET    Take 1 tablet (10 mg total) by mouth daily.   Orders Placed This Encounter  Procedures  . Pneumococcal conjugate vaccine 13-valent  . LDL cholesterol, direct  . Basic metabolic panel  . CBC with Differential  . Hepatic function panel  . Hemoglobin A1c  . Microalbumin / creatinine urine ratio  . LDL cholesterol, direct  . Basic metabolic panel  . Hepatic function panel  . PSA  . PSA   Signed,  Frederico Hamman T. Tatia Petrucci, MD, [place] [facility] [place] [address] Phone: [phone] Fax: [phone]  There are no Patient Instructions on file for this visit. Patient's Medications  New Prescriptions   ATORVASTATIN (LIPITOR) 10 MG TABLET    Take 1 tablet (10 mg total) by mouth daily.  Previous Medications   ASPIRIN 81 MG EC TABLET    Take 81 mg by mouth daily.     BLOOD GLUCOSE MONITORING SUPPL MISC    1 strip by Other route 2 (two) times daily. Please supply patient with Glucose Test Strips that will work with monitor.   NON FORMULARY    Glucometer lancets and strips. Check BID. UAD   Modified Medications   Modified Medication Previous Medication   ALBUTEROL (VENTOLIN HFA) 108 (90 BASE) MCG/ACT INHALER VENTOLIN HFA 108 (90 BASE) MCG/ACT inhaler      Inhale 2 puffs into the lungs every 4 (four) hours as needed for wheezing or shortness of breath.    INHALE 2 PUFFS BY MOUTH EVERY 3 HOURS AS NEEDED   BUDESONIDE-FORMOTEROL (SYMBICORT) 160-4.5 MCG/ACT INHALER SYMBICORT 160-4.5 MCG/ACT inhaler      Inhale 2 puffs into the lungs 2 (two) times daily.    INHALE 2 PUFFS BY MOUTH TWICE A DAY   CARVEDILOL (COREG) 3.125 MG TABLET carvedilol (COREG) 3.125 MG tablet      Take 1 tablet (3.125 mg total) by mouth 2 (two)  times daily.    Take 1 tablet (3.125 mg total) by mouth 2 (two) times daily.   IPRATROPIUM-ALBUTEROL (DUONEB) 0.5-2.5 (3) MG/3ML SOLN ipratropium-albuterol (DUONEB) 0.5-2.5 (3) MG/3ML SOLN      Take 3 mLs by nebulization every 4 (four) hours as needed. DISPENSE 5 BOXES  OF NEBS. RUNNING OUT NOW    USE 1 VIAL VIA NEBULIZER EVERY 4 HOURS AS NEEDED   LOSARTAN (COZAAR) 50 MG TABLET losartan (COZAAR) 50 MG tablet      Take 1 tablet (50 mg total) by mouth daily.    Take 1 tablet (50 mg total) by mouth daily.   METFORMIN (GLUCOPHAGE-XR) 500 MG 24 HR TABLET metFORMIN (GLUCOPHAGE-XR) 500 MG 24 hr tablet      TAKE 1 TABLET BY MOUTH ONCE A DAY    TAKE 1 TABLET BY MOUTH ONCE A DAY  Discontinued Medications   ATORVASTATIN (LIPITOR) 10 MG TABLET    Take 1 tablet (10 mg total) by mouth daily.

## 2013-10-12 NOTE — Progress Notes (Signed)
Pre visit review using our clinic review tool, if applicable. No additional management support is needed unless otherwise documented below in the visit note. 

## 2013-10-13 ENCOUNTER — Telehealth: Payer: Self-pay

## 2013-10-13 LAB — PSA: PSA: 0.18 ng/mL (ref 0.10–4.00)

## 2013-10-13 NOTE — Telephone Encounter (Signed)
Relevant patient education mailed to patient.  

## 2013-10-14 ENCOUNTER — Encounter: Payer: Self-pay | Admitting: Family Medicine

## 2013-10-14 DIAGNOSIS — I252 Old myocardial infarction: Secondary | ICD-10-CM | POA: Insufficient documentation

## 2013-11-14 ENCOUNTER — Other Ambulatory Visit: Payer: Self-pay | Admitting: Family Medicine

## 2013-11-14 ENCOUNTER — Other Ambulatory Visit (INDEPENDENT_AMBULATORY_CARE_PROVIDER_SITE_OTHER): Payer: 59

## 2013-11-14 DIAGNOSIS — R7989 Other specified abnormal findings of blood chemistry: Secondary | ICD-10-CM

## 2013-11-14 LAB — CBC WITH DIFFERENTIAL/PLATELET
BASOS PCT: 0 % (ref 0.0–3.0)
Basophils Absolute: 0 10*3/uL (ref 0.0–0.1)
EOS PCT: 0 % (ref 0.0–5.0)
Eosinophils Absolute: 0 10*3/uL (ref 0.0–0.7)
HCT: 41.9 % (ref 39.0–52.0)
Hemoglobin: 14.3 g/dL (ref 13.0–17.0)
Lymphocytes Relative: 47 % — ABNORMAL HIGH (ref 12.0–46.0)
Lymphs Abs: 2.2 10*3/uL (ref 0.7–4.0)
MCHC: 34 g/dL (ref 30.0–36.0)
MCV: 92.8 fl (ref 78.0–100.0)
Monocytes Absolute: 0.9 10*3/uL (ref 0.1–1.0)
Monocytes Relative: 19 % — ABNORMAL HIGH (ref 3.0–12.0)
NEUTROS PCT: 33 % — AB (ref 43.0–77.0)
Neutro Abs: 1.6 10*3/uL (ref 1.4–7.7)
Platelets: 153 10*3/uL (ref 150.0–400.0)
RBC: 4.52 Mil/uL (ref 4.22–5.81)
RDW: 14.1 % (ref 11.5–14.6)
WBC: 4.7 10*3/uL (ref 4.5–10.5)

## 2013-11-16 ENCOUNTER — Encounter: Payer: Self-pay | Admitting: *Deleted

## 2014-01-20 ENCOUNTER — Ambulatory Visit: Payer: Self-pay | Admitting: Family Medicine

## 2014-01-21 ENCOUNTER — Emergency Department: Payer: Self-pay | Admitting: Internal Medicine

## 2014-01-21 LAB — CBC
HCT: 38.7 % — ABNORMAL LOW (ref 40.0–52.0)
HGB: 13.4 g/dL (ref 13.0–18.0)
MCH: 32.3 pg (ref 26.0–34.0)
MCHC: 34.7 g/dL (ref 32.0–36.0)
MCV: 93 fL (ref 80–100)
Platelet: 141 10*3/uL — ABNORMAL LOW (ref 150–440)
RBC: 4.15 10*6/uL — ABNORMAL LOW (ref 4.40–5.90)
RDW: 13.9 % (ref 11.5–14.5)
WBC: 6 10*3/uL (ref 3.8–10.6)

## 2014-01-21 LAB — BASIC METABOLIC PANEL
Anion Gap: 7 (ref 7–16)
BUN: 14 mg/dL (ref 7–18)
Calcium, Total: 9.1 mg/dL (ref 8.5–10.1)
Chloride: 107 mmol/L (ref 98–107)
Co2: 25 mmol/L (ref 21–32)
Creatinine: 0.91 mg/dL (ref 0.60–1.30)
EGFR (African American): >60
EGFR (Non-African Amer.): >60
Glucose: 103 mg/dL — ABNORMAL HIGH (ref 65–99)
Osmolality: 278 (ref 275–301)
Potassium: 3.7 mmol/L (ref 3.5–5.1)
Sodium: 139 mmol/L (ref 136–145)

## 2014-01-21 LAB — TROPONIN I: TROPONIN-I: 0.02 ng/mL

## 2014-03-26 ENCOUNTER — Other Ambulatory Visit: Payer: Self-pay | Admitting: Family Medicine

## 2014-03-26 NOTE — Telephone Encounter (Signed)
Received refill request electronically from pharmacy. Last office visit and refill 10/12/13 #331ml/5 refills. This is as needed medication. Is it okay to refill medication?

## 2014-03-28 ENCOUNTER — Other Ambulatory Visit: Payer: Self-pay | Admitting: Family Medicine

## 2014-08-25 ENCOUNTER — Other Ambulatory Visit: Payer: Self-pay | Admitting: Family Medicine

## 2014-08-26 ENCOUNTER — Other Ambulatory Visit: Payer: Self-pay | Admitting: Family Medicine

## 2014-08-26 NOTE — Telephone Encounter (Signed)
Last office visit 10/12/2013.  Ok to refill? 

## 2014-08-27 ENCOUNTER — Other Ambulatory Visit: Payer: Self-pay | Admitting: Family Medicine

## 2014-08-27 NOTE — Telephone Encounter (Signed)
Pt request status of refill; spoke with Lattie Haw at [facility]; rx ready for pick up; pt voiced understanding.

## 2014-10-01 ENCOUNTER — Emergency Department: Payer: Self-pay | Admitting: Emergency Medicine

## 2014-10-01 ENCOUNTER — Telehealth: Payer: Self-pay | Admitting: Family Medicine

## 2014-10-01 ENCOUNTER — Ambulatory Visit: Payer: 59 | Admitting: Internal Medicine

## 2014-10-01 NOTE — Telephone Encounter (Signed)
[place] Patient Name: Tony Watson DOB: [birth year] Initial Comment Caller states her husband is coughing, has COPD, gets short of breath, xfer from office for triage Nurse Assessment Nurse: Ronnald Ramp, RN, Miranda Date/Time (Eastern Time): 10/01/2014 8:28:17 AM Confirm and document reason for call. If symptomatic, describe symptoms. ---Caller states her husband has a cold and cough. He is having difficulty. Albuterol nebs Q 20-30 min Has the patient traveled out of the country within the last 30 days? ---Not Applicable Does the patient require triage? ---Yes Related visit to physician within the last 2 weeks? ---N/A Does the PT have any chronic conditions? (i.e. diabetes, asthma, etc.) ---Yes List chronic conditions. ---COPD Guidelines Guideline Title Affirmed Question Affirmed Notes Asthma Attack [1] SEVERE asthma attack (e.g., very SOB at rest, speaks in single words, loud wheezes) AND [2] not resolved after 2 nebulizer or inhaler treatments Final Disposition User Go to ED Now Ronnald Ramp, RN, Miranda Comments Pt has an appt at 1pm, will cancel that appt.

## 2014-10-03 ENCOUNTER — Telehealth: Payer: Self-pay | Admitting: Family Medicine

## 2014-10-03 NOTE — Telephone Encounter (Signed)
will see tomorrow. 

## 2014-10-03 NOTE — Telephone Encounter (Signed)
Pt has appt with Dr Darnell Level on 10/04/14 at 11:15 am.

## 2014-10-03 NOTE — Telephone Encounter (Signed)
EASE NOTE: All timestamps contained within this report are represented as [place] Standard Time. CONFIDENTIALTY NOTICE: This fax transmission is intended only for the addressee. It contains information that is legally privileged, confidential or otherwise protected from use or disclosure. If you are not the intended recipient, you are strictly prohibited from reviewing, disclosing, copying using or disseminating any of this information or taking any action in reliance on or regarding this information. If you have received this fax in error, please notify us immediately by telephone so that we can arrange for its return to [place]. Phone: [phone], Toll-Free: [phone], Fax: [phone] Page: 1 of 1 Call Id: [id number] [place] Patient Name: Tony Watson DOB: 04-26-1951 Initial Comment Caller states is short of breath , breathing is tight, wanted to make appt for Friday but his Dr is not there Nurse Assessment Nurse: Mechele Dawley, RN, Amy Date/Time (Eastern Time): 10/03/2014 2:35:38 PM Confirm and document reason for call. If symptomatic, describe symptoms. ---CALLER STATES THAT HE WAS SEEN IN THE ED ON MONDAY WITH A DX OF BRONCHITIS FROM A COLD THAT HE HAD. THEY ARE TREATING THIS LIKE PNEUMONIA. HE HAS A DEEP COUGH. HE IS GETTING UP CLEAR-MILKY COLOR SECRETIONS. NO CHEST PAIN, TIGHTNESS ONLY. TIGHTNESS IS THERE ALL THE TIME. CANT TAKE A DEEP BREATH IN AND BLOW IT OUT WITHOUT COUGHING. HE IS BREATHING FASTER THAN NORMAL. NO FEVER, HOT FLASHES. HE WILL START GAGGING WHEN HE USES NEBULIZER TREATMENTS. Has the patient traveled out of the country within the last 30 days? ---Not Applicable Does the patient require triage? ---Yes Related visit to physician within the last 2 weeks? ---No Does the PT have any chronic conditions? (i.e. diabetes, asthma, etc.) ---Yes List chronic conditions.  ---COPD Guidelines Guideline Title Affirmed Question Affirmed Notes Infection on Antibiotic Follow-up Call [1] Taking antibiotic > 72 hours (3 days) AND [2] symptoms (other than fever) not improved Final Disposition User Call PCP within 24 Hours [place], Therapist, sports, Amy Comments SCHEDULED APPT WITH MD IN THE MORNING AT 1115. CALLER IS AWARE.

## 2014-10-04 ENCOUNTER — Encounter: Payer: Self-pay | Admitting: *Deleted

## 2014-10-04 ENCOUNTER — Telehealth: Payer: Self-pay | Admitting: Family Medicine

## 2014-10-04 ENCOUNTER — Encounter: Payer: Self-pay | Admitting: Family Medicine

## 2014-10-04 ENCOUNTER — Ambulatory Visit (INDEPENDENT_AMBULATORY_CARE_PROVIDER_SITE_OTHER): Payer: 59 | Admitting: Family Medicine

## 2014-10-04 VITALS — BP 142/80 | HR 100 | Temp 98.0°F | Wt 205.5 lb

## 2014-10-04 DIAGNOSIS — J441 Chronic obstructive pulmonary disease with (acute) exacerbation: Secondary | ICD-10-CM

## 2014-10-04 DIAGNOSIS — Z720 Tobacco use: Secondary | ICD-10-CM

## 2014-10-04 DIAGNOSIS — F17200 Nicotine dependence, unspecified, uncomplicated: Secondary | ICD-10-CM

## 2014-10-04 MED ORDER — PREDNISONE 20 MG PO TABS: ORAL_TABLET | ORAL | Status: DC

## 2014-10-04 MED ORDER — IPRATROPIUM BROMIDE 0.02 % IN SOLN
0.5000 mg | Freq: Once | RESPIRATORY_TRACT | Status: AC
  Administered 2014-10-04: 0.5 mg via RESPIRATORY_TRACT

## 2014-10-04 MED ORDER — ALBUTEROL SULFATE (2.5 MG/3ML) 0.083% IN NEBU
2.5000 mg | INHALATION_SOLUTION | Freq: Once | RESPIRATORY_TRACT | Status: AC
  Administered 2014-10-04: 2.5 mg via RESPIRATORY_TRACT

## 2014-10-04 MED ORDER — BUDESONIDE-FORMOTEROL FUMARATE 160-4.5 MCG/ACT IN AERO: 2.0000 | INHALATION_SPRAY | Freq: Two times a day (BID) | RESPIRATORY_TRACT | Status: DC

## 2014-10-04 MED ORDER — DOXYCYCLINE HYCLATE 100 MG PO CAPS: 100.0000 mg | ORAL_CAPSULE | Freq: Two times a day (BID) | ORAL | Status: DC

## 2014-10-04 NOTE — Assessment & Plan Note (Addendum)
With slowed recovery. Extend prednisone course and extend antibiotic coverage with doxycycline 10d course. Duoneb TID scheduled with albuterol inhaler as needed. Push fluids and rest. rec restart symbicort once done with prednisone course. Refilled. Out of work through Monday.

## 2014-10-04 NOTE — Assessment & Plan Note (Signed)
Strongly continued to encourage smoking cessation.

## 2014-10-04 NOTE — Progress Notes (Signed)
Pre visit review using our clinic review tool, if applicable. No additional management support is needed unless otherwise documented below in the visit note. 

## 2014-10-04 NOTE — Patient Instructions (Addendum)
You have a COPD exacerbation. Treat with doxycycline 10d antibiotic. Extend prednisone course - new taper sent to pharmacy for when you finish 50mg  dose. Continue duonebs at home three times a day with albuterol inhaler as needed in between until feeling better. Quit smoking - consider nicotine patch 51mcg daily while you have current COPD flare. Please return if fever >101 or worsening productive cough or shortness of breath.  Out of work through monday  Chronic Obstructive Pulmonary Disease Chronic obstructive pulmonary disease (COPD) is a common lung condition in which airflow from the lungs is limited. COPD is a general term that can be used to describe many different lung problems that limit airflow, including both chronic bronchitis and emphysema. If you have COPD, your lung function will probably never return to normal, but there are measures you can take to improve lung function and make yourself feel better.  CAUSES   Smoking (common).   Exposure to secondhand smoke.   Genetic problems.  Chronic inflammatory lung diseases or recurrent infections. SYMPTOMS   Shortness of breath, especially with physical activity.   Deep, persistent (chronic) cough with a large amount of thick mucus.   Wheezing.   Rapid breaths (tachypnea).   Gray or bluish discoloration (cyanosis) of the skin, especially in fingers, toes, or lips.   Fatigue.   Weight loss.   Frequent infections or episodes when breathing symptoms become much worse (exacerbations).   Chest tightness. DIAGNOSIS  Your health care provider will take a medical history and perform a physical examination to make the initial diagnosis. Additional tests for COPD may include:   Lung (pulmonary) function tests.  Chest X-ray.  CT scan.  Blood tests. TREATMENT  Treatment available to help you feel better when you have COPD includes:   Inhaler and nebulizer medicines. These help manage the symptoms of COPD and  make your breathing more comfortable.  Supplemental oxygen. Supplemental oxygen is only helpful if you have a low oxygen level in your blood.   Exercise and physical activity. These are beneficial for nearly all people with COPD. Some people may also benefit from a pulmonary rehabilitation program. HOME CARE INSTRUCTIONS   Take all medicines (inhaled or pills) as directed by your health care provider.  Avoid over-the-counter medicines or cough syrups that dry up your airway (such as antihistamines) and slow down the elimination of secretions unless instructed otherwise by your health care provider.   If you are a smoker, the most important thing that you can do is stop smoking. Continuing to smoke will cause further lung damage and breathing trouble. Ask your health care provider for help with quitting smoking. He or she can direct you to community resources or hospitals that provide support.  Avoid exposure to irritants such as smoke, chemicals, and fumes that aggravate your breathing.  Use oxygen therapy and pulmonary rehabilitation if directed by your health care provider. If you require home oxygen therapy, ask your health care provider whether you should purchase a pulse oximeter to measure your oxygen level at home.   Avoid contact with individuals who have a contagious illness.  Avoid extreme temperature and humidity changes.  Eat healthy foods. Eating smaller, more frequent meals and resting before meals may help you maintain your strength.  Stay active, but balance activity with periods of rest. Exercise and physical activity will help you maintain your ability to do things you want to do.  Preventing infection and hospitalization is very important when you have COPD. Make sure to  receive all the vaccines your health care provider recommends, especially the pneumococcal and influenza vaccines. Ask your health care provider whether you need a pneumonia vaccine.  Learn and use  relaxation techniques to manage stress.  Learn and use controlled breathing techniques as directed by your health care provider. Controlled breathing techniques include:   Pursed lip breathing. Start by breathing in (inhaling) through your nose for 1 second. Then, purse your lips as if you were going to whistle and breathe out (exhale) through the pursed lips for 2 seconds.   Diaphragmatic breathing. Start by putting one hand on your abdomen just above your waist. Inhale slowly through your nose. The hand on your abdomen should move out. Then purse your lips and exhale slowly. You should be able to feel the hand on your abdomen moving in as you exhale.   Learn and use controlled coughing to clear mucus from your lungs. Controlled coughing is a series of short, progressive coughs. The steps of controlled coughing are:  1. Lean your head slightly forward.  2. Breathe in deeply using diaphragmatic breathing.  3. Try to hold your breath for 3 seconds.  4. Keep your mouth slightly open while coughing twice.  5. Spit any mucus out into a tissue.  6. Rest and repeat the steps once or twice as needed. SEEK MEDICAL CARE IF:   You are coughing up more mucus than usual.   There is a change in the color or thickness of your mucus.   Your breathing is more labored than usual.   Your breathing is faster than usual.  SEEK IMMEDIATE MEDICAL CARE IF:   You have shortness of breath while you are resting.   You have shortness of breath that prevents you from:  Being able to talk.   Performing your usual physical activities.   You have chest pain lasting longer than 5 minutes.   Your skin color is more cyanotic than usual.  You measure low oxygen saturations for longer than 5 minutes with a pulse oximeter. MAKE SURE YOU:   Understand these instructions.  Will watch your condition.  Will get help right away if you are not doing well or get worse. Document Released:  05/06/2005 Document Revised: 12/11/2013 Document Reviewed: 03/23/2013 [facility] Patient Information 2015 [place], [place]. This information is not intended to replace advice given to you by your health care provider. Make sure you discuss any questions you have with your health care provider.

## 2014-10-04 NOTE — Addendum Note (Signed)
Addended by: Royann Shivers A on: 10/04/2014 01:10 PM   Modules accepted: Orders

## 2014-10-04 NOTE — Telephone Encounter (Signed)
emmi mailed  °

## 2014-10-04 NOTE — Progress Notes (Addendum)
BP 142/80 mmHg  Pulse 100  Temp(Src) 98 F (36.7 C) (Oral)  Wt 205 lb 8 oz (93.214 kg)  SpO2 94%   CC: bronchitis  Subjective:    Patient ID: Tony Watson, male    DOB: 1951/03/03, 64 y.o.   MRN: [id number]  HPI: Tony Watson is a 64 y.o. male presenting on 10/04/2014 for Bronchitis   2 wk h/o trouble breathing. Increased cough, mucous production, and dyspnea. + headache from cough. Chest and head congestion. No fevers, ear or tooth pain, headaches. Wife sick at home. Seen at [facility] ER on Monday - s/p CXR. Dx with bronchitis and treated with prednisone 50mg  x5d course, hydrocodone cough syrup and zmax 500mg  x3d. sxs started as cold, and sxs progressed into lungs. Persistent significant dyspnea on exertion + wheezing. Has been compliant with treatment, no noted improvement.   Known COPD - using albuterol inhaler every 2 hours + duoneb too frequently. Not regular with symbicort.   Had CXR and labwork and EKG at ER.  CAD s/p MI 1999, ischemic cardiomyopathy, PFO repair 2004, h/o TIA, controlled DM.  Current smoker 2/3 ppd. Over last week has decreased smoking.  Has dx COPD.   Relevant past medical, surgical, family and social history reviewed and updated as indicated. Interim medical history since our last visit reviewed. Allergies and medications reviewed and updated. Current Outpatient Prescriptions on File Prior to Visit  Medication Sig  . albuterol (VENTOLIN HFA) 108 (90 BASE) MCG/ACT inhaler Inhale 2 puffs into the lungs every 4 (four) hours as needed for wheezing or shortness of breath.  Marland Kitchen aspirin 81 MG EC tablet Take 81 mg by mouth daily.    Marland Kitchen atorvastatin (LIPITOR) 10 MG tablet Take 1 tablet (10 mg total) by mouth daily.  . Blood Glucose Monitoring Suppl MISC 1 strip by Other route 2 (two) times daily. Please supply patient with Glucose Test Strips that will work with monitor.  . budesonide-formoterol (SYMBICORT) 160-4.5 MCG/ACT inhaler Inhale 2 puffs into the lungs 2  (two) times daily.  . carvedilol (COREG) 3.125 MG tablet Take 1 tablet (3.125 mg total) by mouth 2 (two) times daily.  Marland Kitchen ipratropium-albuterol (DUONEB) 0.5-2.5 (3) MG/3ML SOLN USE 1 VIAL VIA NEBULIZER EVERY 4 HOURS AS NEEDED  . losartan (COZAAR) 50 MG tablet Take 1 tablet (50 mg total) by mouth daily.  . metFORMIN (GLUCOPHAGE-XR) 500 MG 24 hr tablet TAKE 1 TABLET BY MOUTH ONCE A DAY  . NON FORMULARY Glucometer lancets and strips. Check BID. UAD    No current facility-administered medications on file prior to visit.    Review of Systems Per HPI unless specifically indicated above     Objective:    BP 142/80 mmHg  Pulse 100  Temp(Src) 98 F (36.7 C) (Oral)  Wt 205 lb 8 oz (93.214 kg)  SpO2 94%  Wt Readings from Last 3 Encounters:  10/04/14 205 lb 8 oz (93.214 kg)  10/12/13 210 lb 12 oz (95.596 kg)  06/09/12 216 lb 8 oz (98.204 kg)    Physical Exam  Constitutional: He appears well-developed and well-nourished. No distress.  HENT:  Head: Normocephalic and atraumatic.  Right Ear: Hearing, tympanic membrane, external ear and ear canal normal.  Left Ear: Hearing, tympanic membrane, external ear and ear canal normal.  Nose: Nose normal. No mucosal edema or rhinorrhea. Right sinus exhibits no maxillary sinus tenderness and no frontal sinus tenderness. Left sinus exhibits no maxillary sinus tenderness and no frontal sinus tenderness.  Mouth/Throat: Uvula  is midline, oropharynx is clear and moist and mucous membranes are normal. No oropharyngeal exudate, posterior oropharyngeal edema, posterior oropharyngeal erythema or tonsillar abscesses.  Eyes: Conjunctivae and EOM are normal. Pupils are equal, round, and reactive to light. No scleral icterus.  Neck: Normal range of motion. Neck supple.  Cardiovascular: Normal rate, regular rhythm, normal heart sounds and intact distal pulses.   No murmur heard. Distant heart sounds  Pulmonary/Chest: Effort normal. No respiratory distress. He has no  decreased breath sounds. He has wheezes (diffuse coarse wheezing throughout). He has no rhonchi. He has no rales.  After alb/atrovent neb, improved air movement, decreased wheezing  Lymphadenopathy:    He has no cervical adenopathy.  Skin: Skin is warm and dry. No rash noted.  Nursing note and vitals reviewed.      Assessment & Plan:   Problem List Items Addressed This Visit    TOBACCO USE    Strongly continued to encourage smoking cessation.      COPD exacerbation - Primary    With slowed recovery. Extend prednisone course and extend antibiotic coverage with doxycycline 10d course. Duoneb TID scheduled with albuterol inhaler as needed. Push fluids and rest. rec restart symbicort once done with prednisone course. Refilled. Out of work through Monday.      Relevant Medications   predniSONE (DELTASONE) 50 MG tablet   Hydrocodone-Chlorpheniramine 5-4 MG/5ML SOLN   predniSONE (DELTASONE) tablet       Follow up plan: Return if symptoms worsen or fail to improve.   ADDENDUM ==> records from [facility] reviewed EKG - RBBB, poor quality EKG WBC 7.4, Hgb 13.1, plt 135, TnI <0.02, Cr 0.89, Na 139, K 3.9 CXR - no acute process, median sternotomy sequelae

## 2014-10-25 ENCOUNTER — Other Ambulatory Visit: Payer: Self-pay | Admitting: Family Medicine

## 2014-11-28 ENCOUNTER — Other Ambulatory Visit: Payer: Self-pay | Admitting: Family Medicine

## 2014-11-28 NOTE — Telephone Encounter (Signed)
Please call patient schedule CPE with Dr. Lorelei Pont.

## 2014-11-30 NOTE — Consult Note (Signed)
CC: salmonella and C. diff infections.  Pt still with about 15 bowel movements today, many at nite.  Starting to slow down per him.  No vomiting, no peritoneal signs.  WBC down and temp down.  Continue current course.  Electronic Signatures: Manya Silvas (MD)  (Signed on 02-Jul-14 17:25)  Authored  Last Updated: 02-Jul-14 17:25 by Manya Silvas (MD)

## 2014-11-30 NOTE — Consult Note (Signed)
CC: diarrhea.  Pt with salmonella in stool and C. diff.  Feeling a little better, still some abd discomfort and lots of diarrhea.   On cipro and flagyl iv and oral vanc.  Can likely stop iv flagyl and change cipro to oral tomorrow.  could still be 4-5 days before well enough to go home.  Salmonella can be difficult to treat.  Abd with bowel sounds present.   Electronic Signatures: Manya Silvas (MD)  (Signed on 01-Jul-14 17:02)  Authored  Last Updated: 01-Jul-14 17:02 by Manya Silvas (MD)

## 2014-11-30 NOTE — Consult Note (Signed)
PATIENT NAME:  Tony Watson, Tony Watson MR#:  [id number] DATE OF BIRTH:  Jun 23, 1951  DATE OF CONSULTATION:  02/05/2013  REFERRING PHYSICIAN:  Gladstone Lighter, MD CONSULTING PHYSICIAN:  Manya Silvas, MD/Ellard Nan A. Jerelene Redden, ANP (Adult Nurse Practitioner)  REASON FOR CONSULTATION: C. difficile colitis.   HISTORY OF PRESENT ILLNESS: This 64 year old patient of Dr. Frederico Hamman Copland has a history of COPD, tobacco use, hypertension, diabetes, myocardial infarction, and history of what may be a ventral septal defect repair during CABG surgery. He reports he was in his usual state of health when he was at work on Friday. He felt fine all day Friday and ate a lunch made by his wife, which was cold meat loaf and ketchup, that had been frozen and then heated in the microwave. The food tasted fine. He was on his way home at 5:00 p.m. and he had a vague sense of nausea that rapidly escalated into vomiting x 3. He had to turn around and go back to work. He did make it home and that evening he vomited about 3 or 4 times, and then diarrhea began at 2200. The patient was sick Friday night, but by morning he felt weak, but was feeling much better. He proceeded to go to work, as he was on call Saturday. He worked all day, had Psychologist, counselling and a few french fries for supper. Saturday night he developed a fever of 102.6, vomiting, and severe diarrhea every 15 minutes. That started about 10:30 p.m. and went all night long. He did develop bilateral lower abdominal cramps with this. On Sunday morning, he was still very sick and presented to the Emergency Room for numerous watery stools that were odorous and green. He was found to have leukocytosis, fevers close to 103, and C. difficile positive stool study. Because he had such severe diarrhea and was febrile, the patient was hospitalized for close monitoring and IV hydration. He did receive a dose of oral Flagyl and has been placed on oral vancomycin.   Today, the patient reports he is  still having severe watery diarrhea, some nonspecific abdominal discomfort but with close questioning, there is a little more discomfort on the left lower quadrant. He relates that 8 out of 10, associated with nausea requiring antiemetics. He did eat half a muffin for breakfast and yogurt and has been sipping on clear liquids. He has had no further vomiting. Laboratory studies have shown increase in leukocytosis with WBC 14.1 to 18.6, and platelet count dropped from 163 to 116. Comprehensive stool studies are pending, C. difficile positive, and blood cultures x 2 have been obtained. GI has been asked to see the patient regarding further evaluation and management.   PAST MEDICAL HISTORY: 1.  Hypertension.  2.  Diabetes mellitus.  3.  COPD. 4.  Tobacco abuse.  5.  Myocardial infarction.  6.  Status post stent.  7.  He had a hole in his heart repaired at the time of his CABG, and it may have been a VSD repair. He does not know the name of it. He had CABG done about 11 years ago. Myocardial infarction was about 16 years ago.   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   MEDICATIONS:  1.  Metformin 500 mg daily.  2.  Coreg 3.125 mg b.i.d.  3.  Aspirin 81 mg daily.  4.  Atorvastatin 10 mg daily.  5.  Losartan 50 mg daily.  6.  Prilosec OTC 20 mg  daily.  7.  Ventolin CFC-free 90 mcg inhaler.  8.  Symbicort 160 mcg.   ALLERGIES: NKDA.   HABITS: Positive tobacco, 1/2 pack per day. Rare social alcohol/beer use.   SOCIAL HISTORY: Married, works as Theatre manager for apartments, does [employer].   REVIEW OF SYSTEMS:  CONSTITUTIONAL: Positive for fever, fatigue, weakness and some chills, started Saturday.  Negative weight loss.  EYES: He did have pinkeye 3 weeks ago, untreated.  EARS, NOSE AND THROAT: No hearing loss, ear pain. RESPIRATORY: Occasional wheezing, has used inhalers at home but no recent upper respiratory infection, cough or sputum  production.  CARDIOVASCULAR: Denies chest pain, pressure, heaviness, tightness, palpitations.  GASTROINTESTINAL: Positive as noted in history of present illness.  GENITOURINARY: Denies dysuria or hematuria.  HEMATOLOGIC: He has noted no anemia, easy bruising.  SKIN: No rashes. He did have a minor dog bite to the right hand 5 weeks ago; one of the apartment dogs had bit him, broke the skin, no significant infection, no antibiotics, and the dog was up-to-date on shots.  MUSCULOSKELETAL: Denies arthritis or gout, joint pain, swelling. Mild backache, and he does report history of lower back spinal stenosis.  NEUROLOGIC: He denies any numbness, weakness, seizure activity, neck pain or stiffness. The patient reports a mild headache, started over the weekend.  PSYCHIATRIC: Denies anxiety, depression. Wife reports he is stoic.   PHYSICAL EXAMINATION:  VITAL SIGNS: Temperature is 101.2 T-max, heart rate 105 to 122, respirations 18, blood pressure 155/78. Pulse oximetry on room air is about 92% to 90%.  GENERAL: Moderately ill-looking Caucasian male resting in bed.  HEENT: Head is normocephalic. Conjunctivae pink. Oral mucosa is dry and intact.  NECK: Supple. He does have decreased range of motion with flexion, but no discrete neck pain or nuchal rigidity. He reports the decreased range of motion is baseline. Neck with trachea midline. No lymphadenopathy.  HEART: Heart tones S1, S2, tachycardia noted without murmur, rub, gallop.  LUNGS: Essentially CTA. Respirations are nonlabored.  ABDOMEN: Soft, positive bowel sounds, somewhat hypoactive, no tinkling noted, and he reports mild distention. There is tenderness in left lower quadrant, right lower quadrant. No tenderness right upper quadrant. Abdomen exam is nonspecific.  RECTAL: Exam is deferred.  SKIN: Warm and dry without rash or edema.  EXTREMITIES: Lower extremities without edema, cyanosis or clubbing. Right hand dog bite site is unremarkable, healed.   MUSCULOSKELETAL: He is able to sit up in bed with some assistance, appears weak. Extremities with movement equal bilaterally. Gait not evaluated, but he has been up to the bedside commode.  PSYCHIATRIC: He is drowsy, arouses, answers questions, sleeps, and then he does follow all commands.   LABORATORY DATA: Admission blood work with glucose 132, BUN 18, creatinine 1.26. Electrolytes are normal. Albumin is 4.6. AST is 42. WBC 14.1, hemoglobin 16.6, platelets 163. C. difficile positive. Comprehensive stool pending. Urinalysis: 2+ blood, WBCs 1 per high-power field.   Laboratory studies dated 02/06/2013 with glucose 102, magnesium 1.3. Triglycerides 75. WBC of 18.6, hemoglobin 14.6. Hemoglobin A1c 6.5.   RADIOLOGY: CT of the abdomen and pelvis with contrast performed 02/05/2013 shows stomach moderately distended with oral contrast. The bowel gas pattern may reflect gastroenteritis or diarrheal process. No objective evidence of bowel wall thickening or acute inflammation. Gallbladder contracted, no stones evident. There is disk space narrowing at L4-L5 and L5-S1. There are small fat-containing inguinal hernias bilaterally. A few mildly enlarged periceliac lymph nodes. The psoas musculature is normal in appearance. No free fluid in the  abdomen or pelvis.   IMPRESSION: Acute nausea, vomiting, diarrhea, onset suddenly Friday evening, persisted over the weekend to the point with severe watery green diarrhea, Clostridium difficile positive, leukocytosis and thrombocytopenia, concerning for sepsis.   PLAN: This patient appears ill. He has spiked fevers today. He has tachycardia, leukocytosis with falling thrombocytopenia is concerning for sepsis. Blood cultures have just been obtained. Would broaden antibiotic coverage with IV Cipro and IV Flagyl. Continue with oral vancomycin 250 mg q.6 hours for known C. difficile infection. The CT study did not show significant colon luminal abnormalities. It is possible to  have severe C. difficile without significant findings on the CT, but his acute sudden presentation is concerning for other etiology for his sepsis. Would recommend ID consult, but have discovered that Dr. Ola Spurr is on vacation until 02/13/2013. Blood cultures and urine comprehensive culture is pending. Would obtain a chest x-ray. Abdominal x-ray film has been ordered. Close monitoring for I and O's, chest x-ray. Avoid NSAIDs and antidiarrheals and keep patient on clear liquid diet only. CBCs daily. Close monitoring.   This case was discussed in collaboration with Dr. Vira Agar who reviewed the patient's records, laboratory studies, CT finding, and has directed the patient's care. Close followup with GI.   Thank you for this consultation.   These services provided [facility] A. Jerelene Redden, RN, MSN, ANP (Adult Nurse Practitioner), in collaboration with Manya Silvas, MD.    ____________________________ Janalyn Harder Jerelene Redden, ANP (Adult Nurse Practitioner) kam:jm D: 02/06/2013 16:40:10 ET T: 02/06/2013 17:22:05 ET JOB#: [id number]  cc: Joelene Millin A. Jerelene Redden, ANP (Adult Nurse Practitioner), <Dictator> Vivien Presto, MD Janalyn Harder Sherlyn Hay, MSN, ANP-BC Adult Nurse Practitioner ELECTRONICALLY SIGNED 02/07/2013 12:09

## 2014-11-30 NOTE — Consult Note (Signed)
CC: diarrhea.  I think he can go home tomorrow.  Would give flagyl 500mg  qid or vancomycin 250 mg qid for 7 days after cipro stopped.  I recommend 2-3 days of cipro oral as outpt   Can follow up with me toward end of next week.    Electronic Signatures: Manya Silvas (MD)  (Signed on 03-Jul-14 17:04)  Authored  Last Updated: 03-Jul-14 17:04 by Manya Silvas (MD)

## 2014-11-30 NOTE — H&P (Signed)
PATIENT NAME:  Tony Watson, Tony Watson MR#:  [id number] DATE OF BIRTH:  1951-08-08  DATE OF ADMISSION:  02/05/2013  REFERRING PHYSICIAN:  Dr. Benjaman Lobe.  PRIMARY CARE PHYSICIAN:  He does not know.    CHIEF COMPLAINT: Nausea, vomiting, diarrhea.   HISTORY OF PRESENT ILLNESS: The patient is a pleasant 64 year old Caucasian male with COPD, ongoing smoking, hypertension, diabetes, history of MI, status post stent, who presents with the above chief complaint. The patient stated that he started to have nausea, vomiting and abdominal pain on Friday and vomited 5 times on his way back from work. He started having abdominal pain Saturday and has had diarrhea. He states that he cannot keep anything down and has had about 30 to 40 episodes of loose stools every day yesterday and today. He states that he has diarrhea every 10 to 15 minutes and it is loose, watery. Here, he was noted to have significant leukocytosis of 14,000 and was noted to be positive for C. diff. He was going to be discharged with Flagyl; however, had persistent diarrhea and a CT of abdomen and pelvis was done showing bowel gas pattern reflecting gastroenteritis or diarrheal process. He thinks he will have difficulty with p.o. as well and hospitalist service was contacted for further evaluation and management after the patient spiked a fever of close to 103.  PAST MEDICAL HISTORY: Hypertension, diabetes, COPD, MI, status post stenting and a valve surgery which he could not elaborate further.   SURGERIES: A valve surgery.   FAMILY HISTORY:  MI and cancer.   SOCIAL HISTORY: Smokes 1/2 pack a day, occasional alcohol. No drug use. Works as Architectural technologist.   OUTPATIENT MEDICATIONS: He does not know most of his medications. He knows he is on nebulizers and Advair and Prilosec. No recent antibiotics.   REVIEW OF SYSTEMS CONSTITUTIONAL: Positive for fever, fatigue, weakness.  EYES: No blurry vision or double vision.  ENT: No tinnitus or hearing  loss.  RESPIRATORY:  The patient has cough and some wheezing today. He has not had his inhalers. This patient also has COPD.   CARDIOVASCULAR: No chest pain, palpitations, edema.  GASTROINTESTINAL: Positive for nausea, vomiting, diarrhea. No bloody stools or melena.  GENITOURINARY: Denies dysuria or hematuria. HEMATOLOGY AND LYMPH:  No anemia or easy bruising.  SKIN: No rashes.  MUSCULOSKELETAL: Denies arthritis or gout.  NEUROLOGIC: Denies focal weakness, numbness. Has global weakness.  PSYCHIATRIC: No anxiety or insomnia.   PHYSICAL EXAMINATION VITAL SIGNS: Temperature on arrival was 99.3 but the patient had a fever of 103.1. Initial pulse 117, respiratory rate 20, blood pressure 133/75, O2 sat 94% on room air.  GENERAL: The patient is a well-developed Caucasian male sitting on bed, talking in full sentences.  HEENT: Normocephalic, atraumatic. Pupils are equal and reactive. Anicteric sclerae. Extremely  dry mucous membranes.  NECK: Supple. No thyroid tenderness. No cervical lymphadenopathy.  CARDIOVASCULAR: S1, S2, tachycardic. No murmurs, rubs or gallops.  LUNGS: Mild diffuse wheezing, more at the bases on the right than the left. Good air entry otherwise.  ABDOMEN: Soft. Some epigastric tenderness to deep palpation. Hyperactive bowel sounds in all quadrants. No organomegaly.  EXTREMITIES: No significant lower extremity edema.  NEUROLOGIC: Cranial nerves II through XII appear to be grossly intact. Strength is 5 out of 5 in all extremities.  PSYCHIATRIC: Awake, alert, oriented x 3.   LABORATORY DATA:  Glucose 132, BUN 18, creatinine 1.26, sodium 136, potassium 3.5. Lipase 52. C. diff. positive.  Alk phos 46, AST 42, ALT  51, total bilirubin 0.6. WBC 14.1, platelets are 163, hemoglobin 16.6. UA not suggestive of infection.   ASSESSMENT AND PLAN: We have a 63 year old male with hypertension, diabetes, chronic obstructive pulmonary disease, ongoing smoking, who presents with nausea, vomiting  and per him episodes of diarrhea of up to 30 to 40 times in the last day per day. He has a high-grade fever and leukocytosis and, given the fact that he will have diarrhea to the point of not being able to replenish p.o., he needs to be hospitalized for IV fluids and vancomycin given the extremely high number of episodes of diarrhea. He has sepsis per criteria and would be started on vancomycin 250 mg q.6 hours. Would place him in an isolation room. He has not had recent antibiotics but he is on Prilosec, which can predispose to Clostridium difficile as well. He does not have any sick contacts. Would start him on a sliding-scale insulin. Unfortunately, he does not know most of his medications. We would hold his blood pressure medications and give him IV fluids. He is wheezing and he is actively using tobacco. He was counseled for 3 minutes in regards to cessation. He is agreeable to that it and at this time we would start him on a nicotine patch and Advair and add Spiriva as he does not think he is on Spiriva. We would start him on nebulizers q.4 hours p.r.n. as well. Would start him on Zofran and pain medications for symptomatic relief and follow him along.   TOTAL TIME SPENT: 45 minutes.   CODE STATUS: The patient is full code.     ____________________________ Vivien Presto, MD sa:cs D: 02/05/2013 19:21:57 ET T: 02/05/2013 19:59:32 ET JOB#: [id number]  cc: Vivien Presto, MD, <Dictator> Vivien Presto MD ELECTRONICALLY SIGNED 03/11/2013 11:36

## 2014-11-30 NOTE — Consult Note (Signed)
Brief Consult Note: Diagnosis: Acute n/v/diarrhea onset Fri with positive C diff stool, sepsis -may be due to another etiology.   Patient was seen by consultant.   Consult note dictated.   Recommend further assessment or treatment.   Orders entered.   Comments: Patient appears  ill with fever, tachycardia, increasing leukocytosis with thrombocytopenia consistent with sepsis. The C difficele infection can make patients very ill, but he was fine Fri am and became acutely ill Fri eve. This is a very sudden and severe presentation and doubt that his sepsis is from the C difficele alone. He is still having diarrhea, some nausea without vomiting on antiemetics, some lower back aches, mild headache.  The abd CT was negative for bowel wall thickening, bowel obstruction, normal psosas muscle without definite mention of the appendix. Gallbladder contracted.  Abd exam is soft, bowel sounds normal and mild- moderate  tenderness bilateral general - but more tender LLQ. He reports some distension from baseline. Neg rigidity/rebound/guarding. Lungs are CTA now-nursing reported wheezes earlier. No flank pain to percussion.  Neck flexes with decreased range of motion- but not significant pain.  Pt gives a hx of a minor dog bite on right hand 5 weeks ago-no antibiotic use. He also reported "pink eye" 3 weeks ago-untreated.  PLAN: This case was d/w Dr. Vira Agar who reviewed labs and CT report. Begin IV Cipro and IV Flagyl immediately once blood cultures obtained to broaden systemic bacterial coverage for abdominal cramps and n/v/diarrhea.  Continue with oral vancomycin for C difficele coverage only.  Avoid NSAIDs. Clear liquid diet only for now. Avoid anti diarrheals.  Daily cbc. Measure I/Os.  CXR portable. Abd Films ordered by attending. GI will follow closely with you. ID coverage is not available today. Dr. Ola Spurr is on vacation until 02/13/13.Marland Kitchen  Electronic Signatures: Gershon Mussel (NP)  (Signed 30-Jun-14  16:11)  Authored: Brief Consult Note   Last Updated: 30-Jun-14 16:11 by Gershon Mussel (NP)

## 2014-11-30 NOTE — Consult Note (Signed)
Pt CC is vomiting and diarrhea, now with C,. diff in stool.  Pt ate meat loaf originally cooked at home and then frozen in the freezer and after 3 hours he had onset of pain and vomiting multiple times.  Next day he had onset of severe diarrhea and more vomiting.  He likely got some bad meat which had staph and C, diff in it in a large amount.  His stools are numerous and smell like C, Diff.  He has a fever and tachycardia.  He feels about like he did at onset.  No SOB, no pain in abdomen with coughing but on physical exam he has diffuse tenderness.  He should be covered with oral vanc and iv flagyl and cipro if available for now.  When WBC better and afebrile can stop cipro.  Will follow with you.  K low and iv ordered for replacement.  Avoid antidiarrheal meds.  Expect improvement in 3-4 days.  if hypotension, hypoxemia or altered mental status I would move to CCU.  Electronic Signatures: Manya Silvas (MD)  (Signed on 30-Jun-14 18:37)  Authored  Last Updated: 30-Jun-14 18:37 by Manya Silvas (MD)

## 2014-11-30 NOTE — Discharge Summary (Signed)
PATIENT NAME:  Tony Watson, Tony Watson MR#:  [id number] DATE OF BIRTH:  1951-01-02  DATE OF ADMISSION:  02/05/2013 DATE OF DISCHARGE:  02/10/2013  CONSULTANTS:  Dr. Vira Agar from GI.   CHIEF COMPLAINT:  Nausea, vomiting, diarrhea.   PRIMARY CARE PHYSICIAN:  Dr. Frederico Hamman Copland.   DISCHARGE DIAGNOSES: 1.  Sepsis, likely from Clostridium difficile and Salmonella enteritis.  2.  Chronic obstructive pulmonary disease.  3.  Hypokalemia.  4.  Positive for systemic inflammatory response syndrome criteria/sepsis resolved.  5.  Hypertension.  6.  Diabetes.  7.  Tobacco abuse.  8.  History of coronary artery disease status post stenting.   DISCHARGE MEDICATIONS: 1.  Metformin 500 mg daily. 2.  Carvedilol 3.125 mg 2 times a day. 3.  Losartan 50 mg a day. 4.  Atorvastatin 10 mg daily. 5.  Prilosec 20 mg daily. 6.  Aspirin 81 mg daily. 7.  Albuterol/ipratropium 3 mL every 4 hours. 8.  Symbicort 160/4.5 mcg 2 puffs 2 times a day. 9.  Vancomycin 250 mg per 5 mL oral solution 5 mL every 6 hours for 7 days. 10.  Lactobacillus 1 cap 3 times a day for 21 days. 11.  Spiriva 18 mcg daily. 12.  Ventolin 2 puffs every 4 hours as needed for wheezing. 13.  Cipro 250 mg every 12 hours for 2 days.   DIET:  Low sodium.   ACTIVITY:  As tolerated.   FOLLOWUP:  Please follow with PCP within 1 to 2 weeks. Please follow with GI if persistent diarrhea after finishing antibiotics.   DISPOSITION:  Home.  SIGNIFICANT LABORATORIES AND IMAGING:  Initial BUN 18, creatinine 1.26, potassium 3.5. Lowest potassium was 2.9. The last potassium 3.3. Last magnesium of 1.9. Hemoglobin A1c 6.5. Lipase on arrival 52.  Initial WBC was 14.1. Hemoglobin was 16.6. WBC on June 30 was 18.6. Last WBC of 7.5 on July 3. Last hemoglobin of 12. C. diff on arrival was positive. Blood cultures no growth today.  Stool cultures probably having salmonella group.  The UA not suggestive of infection. Ova and parasites negative.  CT abdomen and  pelvis with contrast shows bowel gas pattern may reflect gastroenteritis or diarrheal process. The gallbladder is contracted. No stones.  No acute urinary tract abnormality. No intra-abdominal or pelvic abscess.   HISTORY OF PRESENT ILLNESS AND HOSPITAL COURSE:  For full details of H and P, please see the dictation on June 29 by Dr. Bridgette Habermann but, in brief, this is a pleasant 64 year old male with ongoing tobacco abuse in setting of COPD, hypertension, diabetes, history of MI status post stent, who came in for nausea, vomiting and abdominal pain with multiple episodes of diarrhea and was noted to be positive for C. diff, but spiked a fever and therefore admitted to the hospitalist service. The patient had loose watery stools every 10 to 15 minutes prior to admission and had leukocytosis and fever. The patient had clinical sepsis on arrival. He was started on vancomycin given the severity of his diarrhea and stool cultures were sent, which subsequently came back with Salmonella groups. GI was consulted. He was also started on Cipro for that. He was started on IV fluids. The patient had to have hemoconcentrated hemoglobin on arrival. He had electrolyte abnormalities, including low potassium and magnesium, which were repleted as needed. He did well. He is tolerating his p.o. and diarrhea is resolving. He has more formed stool and at this point, per GI, he is okay to be discharged. The sepsis has resolved.  He will be discharged with probiotics as well. Spiriva has been added to his COPD regimen. His blood pressure has been stable and at this point, he will be discharged with outpatient followup    ____________________________ Vivien Presto, MD sa:ce D: 02/10/2013 16:59:49 ET T: 02/10/2013 17:07:21 ET JOB#: [id number]  cc: Vivien Presto, MD, <Dictator> Owens Loffler, MD  Vivien Presto MD ELECTRONICALLY SIGNED 02/17/2013 15:07

## 2014-12-30 ENCOUNTER — Other Ambulatory Visit: Payer: Self-pay | Admitting: Family Medicine

## 2015-01-21 ENCOUNTER — Other Ambulatory Visit: Payer: Self-pay | Admitting: Family Medicine

## 2015-01-21 NOTE — Telephone Encounter (Signed)
Last office visit 10/04/2014 with Dr. Darnell Level.  Last CPE 10/12/2013.  Ok to refill?

## 2015-01-21 NOTE — Telephone Encounter (Signed)
Pt called to ck on status of neb med. Advised already approved by Dr Lorelei Pont and pt will ck with pharmacy.

## 2015-01-28 ENCOUNTER — Telehealth: Payer: Self-pay

## 2015-01-28 NOTE — Telephone Encounter (Signed)
Diabetic Bundle. Left voicemail advising the pt his A1C blood test is due. Pt was given the number of [place] to call and schedule lab visit.

## 2015-02-05 ENCOUNTER — Other Ambulatory Visit: Payer: Self-pay | Admitting: Family Medicine

## 2015-02-05 NOTE — Telephone Encounter (Signed)
Pt checking status of duoneb sol for nebulizer; advised pt sent to pharmacy. Pt said he going on 2 week vacation on 02/07/15 and needs med filled prior to leaving on trip. Spoke with Threasa Beards at [employer] and she said would try vacation override; pt just got med 2 weeks ago; Melonie will contact pt with ins. Response. Pt voiced understanding.

## 2015-03-04 ENCOUNTER — Other Ambulatory Visit: Payer: Self-pay | Admitting: Family Medicine

## 2015-03-04 NOTE — Telephone Encounter (Signed)
Pt request status of duoneb refill; advised done and pt will ck with pharmacy.

## 2015-03-18 ENCOUNTER — Other Ambulatory Visit: Payer: Self-pay | Admitting: Family Medicine

## 2015-03-27 ENCOUNTER — Other Ambulatory Visit: Payer: 59

## 2015-03-27 ENCOUNTER — Ambulatory Visit (INDEPENDENT_AMBULATORY_CARE_PROVIDER_SITE_OTHER): Payer: 59 | Admitting: Family Medicine

## 2015-03-27 ENCOUNTER — Other Ambulatory Visit: Payer: Self-pay | Admitting: Family Medicine

## 2015-03-27 ENCOUNTER — Encounter: Payer: Self-pay | Admitting: Family Medicine

## 2015-03-27 VITALS — BP 130/70 | HR 91 | Temp 98.2°F | Ht 70.5 in | Wt 199.8 lb

## 2015-03-27 DIAGNOSIS — R209 Unspecified disturbances of skin sensation: Secondary | ICD-10-CM | POA: Diagnosis not present

## 2015-03-27 DIAGNOSIS — E1151 Type 2 diabetes mellitus with diabetic peripheral angiopathy without gangrene: Secondary | ICD-10-CM

## 2015-03-27 DIAGNOSIS — Z1159 Encounter for screening for other viral diseases: Secondary | ICD-10-CM

## 2015-03-27 DIAGNOSIS — Z79899 Other long term (current) drug therapy: Secondary | ICD-10-CM

## 2015-03-27 DIAGNOSIS — Z125 Encounter for screening for malignant neoplasm of prostate: Secondary | ICD-10-CM

## 2015-03-27 DIAGNOSIS — D611 Drug-induced aplastic anemia: Secondary | ICD-10-CM

## 2015-03-27 DIAGNOSIS — Z114 Encounter for screening for human immunodeficiency virus [HIV]: Secondary | ICD-10-CM

## 2015-03-27 DIAGNOSIS — Z0000 Encounter for general adult medical examination without abnormal findings: Secondary | ICD-10-CM

## 2015-03-27 DIAGNOSIS — E785 Hyperlipidemia, unspecified: Secondary | ICD-10-CM

## 2015-03-27 DIAGNOSIS — E1143 Type 2 diabetes mellitus with diabetic autonomic (poly)neuropathy: Secondary | ICD-10-CM

## 2015-03-27 DIAGNOSIS — E1159 Type 2 diabetes mellitus with other circulatory complications: Secondary | ICD-10-CM

## 2015-03-27 DIAGNOSIS — I252 Old myocardial infarction: Secondary | ICD-10-CM

## 2015-03-27 HISTORY — DX: Type 2 diabetes mellitus with diabetic autonomic (poly)neuropathy: E11.43

## 2015-03-27 LAB — BASIC METABOLIC PANEL
BUN: 14 mg/dL (ref 6–23)
CHLORIDE: 103 meq/L (ref 96–112)
CO2: 26 mEq/L (ref 19–32)
Calcium: 9.3 mg/dL (ref 8.4–10.5)
Creatinine, Ser: 0.81 mg/dL (ref 0.40–1.50)
GFR: 102.21 mL/min (ref >60.00)
GLUCOSE: 131 mg/dL — AB (ref 70–99)
Potassium: 4 mEq/L (ref 3.5–5.1)
Sodium: 138 mEq/L (ref 135–145)

## 2015-03-27 LAB — LIPID PANEL
CHOL/HDL RATIO: 5
CHOLESTEROL: 135 mg/dL (ref 0–200)
HDL: 29.4 mg/dL — AB (ref >39.00)
LDL CALC: 87 mg/dL (ref 0–99)
NonHDL: 105.69
TRIGLYCERIDES: 92 mg/dL (ref 0.0–149.0)
VLDL: 18.4 mg/dL (ref 0.0–40.0)

## 2015-03-27 LAB — CBC WITH DIFFERENTIAL/PLATELET
BASOS PCT: 0.5 % (ref 0.0–3.0)
Basophils Absolute: 0 10*3/uL (ref 0.0–0.1)
EOS ABS: 0 10*3/uL (ref 0.0–0.7)
EOS PCT: 0.3 % (ref 0.0–5.0)
HCT: 38.8 % — ABNORMAL LOW (ref 39.0–52.0)
HEMOGLOBIN: 13.2 g/dL (ref 13.0–17.0)
Lymphocytes Relative: 43 % (ref 12.0–46.0)
Lymphs Abs: 1.9 10*3/uL (ref 0.7–4.0)
MCHC: 34 g/dL (ref 30.0–36.0)
MCV: 94.4 fl (ref 78.0–100.0)
MONO ABS: 1.7 10*3/uL — AB (ref 0.1–1.0)
Monocytes Relative: 39.8 % — ABNORMAL HIGH (ref 3.0–12.0)
NEUTROS ABS: 0.7 10*3/uL — AB (ref 1.4–7.7)
Neutrophils Relative %: 16.4 % — ABNORMAL LOW (ref 43.0–77.0)
PLATELETS: 145 10*3/uL — AB (ref 150.0–400.0)
RBC: 4.11 Mil/uL — ABNORMAL LOW (ref 4.22–5.81)
RDW: 15.4 % (ref 11.5–15.5)
WBC: 4.3 10*3/uL (ref 4.0–10.5)

## 2015-03-27 LAB — HEMOGLOBIN A1C: Hgb A1c MFr Bld: 6.4 % (ref 4.6–6.5)

## 2015-03-27 LAB — HEPATIC FUNCTION PANEL
ALT: 34 U/L (ref 0–53)
AST: 37 U/L (ref 0–37)
Albumin: 4.5 g/dL (ref 3.5–5.2)
Alkaline Phosphatase: 30 U/L — ABNORMAL LOW (ref 39–117)
BILIRUBIN DIRECT: 0.1 mg/dL (ref 0.0–0.3)
BILIRUBIN TOTAL: 0.5 mg/dL (ref 0.2–1.2)
TOTAL PROTEIN: 7.5 g/dL (ref 6.0–8.3)

## 2015-03-27 LAB — PSA: PSA: 0.19 ng/mL (ref 0.10–4.00)

## 2015-03-27 MED ORDER — GABAPENTIN 300 MG PO CAPS: 300.0000 mg | ORAL_CAPSULE | Freq: Every day | ORAL | Status: DC

## 2015-03-27 MED ORDER — METFORMIN HCL ER 500 MG PO TB24: 500.0000 mg | ORAL_TABLET | Freq: Every day | ORAL | Status: DC

## 2015-03-27 MED ORDER — CARVEDILOL 3.125 MG PO TABS: 3.1250 mg | ORAL_TABLET | Freq: Two times a day (BID) | ORAL | Status: DC

## 2015-03-27 MED ORDER — TIZANIDINE HCL 4 MG PO TABS: 4.0000 mg | ORAL_TABLET | Freq: Every evening | ORAL | Status: DC

## 2015-03-27 NOTE — Progress Notes (Signed)
Dr. Karleen Hampshire T. Callin Ashe, MD, CAQ [facility] [address] [place], [address] Phone: [phone] Fax: [phone]  03/27/2015  Patient: Tony Watson, MRN: [id number], DOB: 10/10/1950, 64 y.o.  Primary Physician:  Hannah Beat, MD  Chief Complaint: Annual Exam  Subjective:   Tony Watson is a 64 y.o. pleasant patient who presents with the following:  Preventative Health Maintenance Visit:  Health Maintenance Summary Reviewed and updated, unless pt declines services.  Tobacco History Reviewed. + 2 packs per week Alcohol: No concerns, no excessive use Exercise Habits: Some activity, rec at least 30 mins 5 times a week STD concerns: no risk or activity to increase risk Drug Use: None Encouraged self-testicular check  Health Maintenance  Topic Date Due  . Hepatitis C Screening  [birth year]  . FOOT EXAM  12/23/1961  . OPHTHALMOLOGY EXAM  12/23/1961  . HIV Screening  12/24/1966  . TETANUS/TDAP  12/24/1970  . COLONOSCOPY  12/23/2001  . ZOSTAVAX  12/24/2011  . HEMOGLOBIN A1C  04/14/2014  . URINE MICROALBUMIN  10/13/2014  . INFLUENZA VACCINE  03/11/2015  . PNEUMOCOCCAL POLYSACCHARIDE VACCINE (2) 05/30/2017   Immunization History  Administered Date(s) Administered  . Influenza Split 05/13/2012, 06/06/2013  . Influenza, Seasonal, Injecte, Preservative Fre 05/19/2014  . Pneumococcal Conjugate-13 10/12/2013  . Pneumococcal Polysaccharide-23 05/30/2012   Patient Active Problem List   Diagnosis Date Noted  . Diabetic autonomic neuropathy associated with type 2 diabetes mellitus 03/27/2015  . History of inferior MI (myocardial infarction) 10/14/2013  . CAD (coronary artery disease)   . HTN (hypertension)   . History of noncompliance with medical treatment 05/30/2012  . Systolic heart failure secondary to coronary artery disease 05/30/2012  . COPD, severe 03/23/2011  . ALLERGIC RHINITIS CAUSE UNSPECIFIED 07/31/2010  . Type  2 diabetes mellitus with vascular disease 01/02/2010  . TOBACCO USE 04/17/2009  . PARESTHESIA 04/17/2009  . TRANSIENT ISCHEMIC ATTACKS, HX OF 04/17/2009  . Hyperlipidemia 12/19/2008  . HYPERTENSION, UNSPECIFIED 12/19/2008   Past Medical History  Diagnosis Date  . Ischemic cardiomyopathy     mild with EF 42% on myoviews 2008.  Marland Kitchen TIA (transient ischemic attack)     hx; now s/p PFO closure 2004 (in [place])  . DM2 (diabetes mellitus, type 2)     A1c 6.0% 12/11  . Smoker   . Diverticulosis 2002  . GERD (gastroesophageal reflux disease)   . Ulcer   . Generalized headaches   . Medical non-compliance   . Nicotine dependence     chronic, active  . COPD (chronic obstructive pulmonary disease)   . Seasonal allergic rhinitis   . CAD (coronary artery disease)     inferior MI 11/99 (in [place]) with PCI to [facility] last LHC 2004 [facility]): EF 50%, patent RCA stent. no obstructive disease. last myoview 2008: EF 42%, inferior infarct, no ischemia.   . Systolic heart failure secondary to coronary artery disease 05/30/2012  . HTN (hypertension)   . Hyperlipidemia   . Diabetic autonomic neuropathy associated with type 2 diabetes mellitus 03/27/2015   Past Surgical History  Procedure Laterality Date  . Open heart surgery  2004    PFO repair  . Cardiac catheterization  11/99    CI per PMH   Social History   Social History  . Marital Status: Married    Spouse Name: N/A  . Number of Children: N/A  . Years of Education: N/A   Occupational History  . Not on file.  Social History Main Topics  . Smoking status: Current Every Day Smoker -- 1.00 packs/day for 45 years    Types: Cigarettes  . Smokeless tobacco: Never Used     Comment: 1 ppd +40 years  . Alcohol Use: Yes     Comment: beer weekly  . Drug Use: No  . Sexual Activity: Not on file   Other Topics Concern  . Not on file   Social History Narrative   Married, 1 daughter. 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 Lives in  [place], [place]   Family History  Problem Relation Age of Onset  . Coronary artery disease      family hx  . Breast cancer      1st egree relative <50   No Known Allergies  Medication list has been reviewed and updated.   General: Denies fever, chills, sweats. No significant weight loss. Eyes: Denies blurring,significant itching ENT: Denies earache, sore throat, and hoarseness. Cardiovascular: Denies chest pains, palpitations, dyspnea on exertion Respiratory: Denies cough, dyspnea at rest,wheeezing Breast: no concerns about lumps GI: Denies nausea, vomiting, diarrhea, constipation, change in bowel habits, abdominal pain, melena, hematochezia GU: Denies penile discharge, ED, urinary flow / outflow problems. No STD concerns. Musculoskeletal: Denies back pain, joint pain - OCC BACK PAIN AND LEG CRAMPING Derm: Denies rash, itching Neuro: Denies  paresthesias, frequent falls, frequent headaches Psych: Denies depression, anxiety Endocrine: Denies cold intolerance, heat intolerance, polydipsia Heme: Denies enlarged lymph nodes Allergy: No hayfever  Objective:   BP 130/70 mmHg  Pulse 91  Temp(Src) 98.2 F (36.8 C) (Oral)  Ht 5' 10.5" (1.791 m)  Wt 199 lb 12 oz (90.606 kg)  BMI 28.25 kg/m2 Ideal Body Weight: Weight in (lb) to have BMI = 25: 176.4  No exam data present  GEN: well developed, well nourished, no acute distress Eyes: conjunctiva and lids normal, PERRLA, EOMI ENT: TM clear, nares clear, oral exam WNL Neck: supple, no lymphadenopathy, no thyromegaly, no JVD Pulm: clear to auscultation and percussion, respiratory effort normal CV: regular rate and rhythm, S1-S2, no murmur, rub or gallop, no bruits, peripheral pulses normal and symmetric, no cyanosis, clubbing, edema or varicosities GI: soft, non-tender; no hepatosplenomegaly, masses; active bowel sounds all quadrants GU: no hernia, testicular mass, penile discharge Lymph: no cervical, axillary or inguinal  adenopathy MSK: gait normal, muscle tone and strength WNL, no joint swelling, effusions, discoloration, crepitus  SKIN: clear, good turgor, color WNL, no rashes, lesions, or ulcerations Neuro: normal mental status, normal strength, sensation, and motion Psych: alert; oriented to person, place and time, normally interactive and not anxious or depressed in appearance.  All labs reviewed with patient. 2016 labs are pending.  Lipids:    Component Value Date/Time   CHOL 106 02/06/2013 0455   CHOL 145 05/24/2012 0825   TRIG 75 02/06/2013 0455   TRIG 205.0* 05/24/2012 0825   HDL 37* 02/06/2013 0455   HDL 28.00* 05/24/2012 0825   LDLDIRECT 78.0 10/12/2013 1130   VLDL 15 02/06/2013 0455   VLDL 41.0* 05/24/2012 0825   CHOLHDL 5 05/24/2012 0825   CBC: CBC Latest Ref Rng 01/21/2014 11/14/2013 10/12/2013  WBC 3.8-10.6 x10 3/mm 3 6.0 4.7 4.8  Hemoglobin 13.0-18.0 g/dL 13.4 14.3 13.6  Hematocrit 40.0-52.0 % 38.7(L) 41.9 40.9  Platelets 150-440 x10 3/mm 3 141(L) 153.0 202.5    Basic Metabolic Panel:    Component Value Date/Time   NA 139 01/21/2014 1743   NA 138 10/12/2013 1130   K 3.7 01/21/2014  1743   K 4.3 10/12/2013 1130   CL 107 01/21/2014 1743   CL 103 10/12/2013 1130   CO2 25 01/21/2014 1743   CO2 27 10/12/2013 1130   BUN 14 01/21/2014 1743   BUN 13 10/12/2013 1130   CREATININE 0.91 01/21/2014 1743   CREATININE 0.9 10/12/2013 1130   GLUCOSE 103* 01/21/2014 1743   GLUCOSE 111* 10/12/2013 1130   GLUCOSE 117 07/16/2010 1526   CALCIUM 9.1 01/21/2014 1743   CALCIUM 9.7 10/12/2013 1130   Hepatic Function Latest Ref Rng 10/12/2013 02/05/2013 05/24/2012  Total Protein 6.0 - 8.3 g/dL 7.8 8.5(A) 7.9  Albumin 3.5 - 5.2 g/dL 4.2 4.6 4.2  AST 0 - 37 U/L 44(H) 42(H) 42(H)  ALT 0 - 53 U/L 45 51 47  Alk Phosphatase 39 - 117 U/L 35(L) 46(L) 34(L)  Total Bilirubin 0.3 - 1.2 mg/dL 0.8 0.6 0.5  Bilirubin, Direct 0.0 - 0.3 mg/dL 0.0 - -    Lab Results  Component Value Date   TSH 1.85  05/24/2012   Lab Results  Component Value Date   PSA 0.18 10/13/2013   PSA 0.20 10/12/2013   PSA 0.18 03/17/2011    Assessment and Plan:   Healthcare maintenance  Type 2 diabetes mellitus with vascular disease - Plan: Basic metabolic panel, Hemoglobin A1c, Microalbumin / creatinine urine ratio  PARESTHESIA  Hyperlipidemia - Plan: Lipid panel  History of inferior MI (myocardial infarction)  Screening PSA (prostate specific antigen) - Plan: PSA  Screening for HIV (human immunodeficiency virus) - Plan: HIV antibody  Need for hepatitis C screening test - Plan: Hepatitis C antibody  Encounter for long-term (current) use of medications - Plan: CBC with Differential/Platelet, Hepatic function panel  Diabetic autonomic neuropathy associated with type 2 diabetes mellitus  Health Maintenance Exam: The patient's preventative maintenance and recommended screening tests for an annual wellness exam were reviewed in full today. Brought up to date unless services declined.  Counselled on the importance of diet, exercise, and its role in overall health and mortality. The patient's FH and SH was reviewed, including their home life, tobacco status, and drug and alcohol status.  Follow-up: Return in about 1 year (around 03/26/2016). Unless noted, follow-up in 1 year for Health Maintenance Exam.  New Prescriptions   GABAPENTIN (NEURONTIN) 300 MG CAPSULE    Take 1 capsule (300 mg total) by mouth at bedtime.   TIZANIDINE (ZANAFLEX) 4 MG TABLET    Take 1 tablet (4 mg total) by mouth Nightly.   Orders Placed This Encounter  Procedures  . Basic metabolic panel  . CBC with Differential/Platelet  . Hepatic function panel  . Lipid panel  . Hemoglobin A1c  . Microalbumin / creatinine urine ratio  . HIV antibody  . Hepatitis C antibody  . PSA    Signed,  Karleen Hampshire T. Nakiya Rallis, MD   Patient's Medications  New Prescriptions   GABAPENTIN (NEURONTIN) 300 MG CAPSULE    Take 1 capsule (300 mg  total) by mouth at bedtime.   TIZANIDINE (ZANAFLEX) 4 MG TABLET    Take 1 tablet (4 mg total) by mouth Nightly.  Previous Medications   ASPIRIN 81 MG EC TABLET    Take 81 mg by mouth daily.     BLOOD GLUCOSE MONITORING SUPPL MISC    1 strip by Other route 2 (two) times daily. Please supply patient with Glucose Test Strips that will work with monitor.   NON FORMULARY    Glucometer lancets and strips. Check BID. UAD  Modified Medications   Modified Medication Previous Medication   CARVEDILOL (COREG) 3.125 MG TABLET carvedilol (COREG) 3.125 MG tablet      Take 1 tablet (3.125 mg total) by mouth 2 (two) times daily.    Take 1 tablet (3.125 mg total) by mouth 2 (two) times daily.   IPRATROPIUM-ALBUTEROL (DUONEB) 0.5-2.5 (3) MG/3ML SOLN ipratropium-albuterol (DUONEB) 0.5-2.5 (3) MG/3ML SOLN      USE 1 VIAL VIA NEBULIZER EVERY 4 HOURS AS NEEDED    USE 1 VIAL VIA NEBULIZER EVERY 4 HOURS AS NEEDED   METFORMIN (GLUCOPHAGE-XR) 500 MG 24 HR TABLET metFORMIN (GLUCOPHAGE-XR) 500 MG 24 hr tablet      Take 1 tablet (500 mg total) by mouth daily with breakfast.    Take one tablet daily. **MUST HAVE PHYSICAL FOR FURTHER REFILLS**  Discontinued Medications   ALBUTEROL (VENTOLIN HFA) 108 (90 BASE) MCG/ACT INHALER    Inhale 2 puffs into the lungs every 4 (four) hours as needed for wheezing or shortness of breath.   ATORVASTATIN (LIPITOR) 10 MG TABLET    Take 1 tablet (10 mg total) by mouth daily.   BUDESONIDE-FORMOTEROL (SYMBICORT) 160-4.5 MCG/ACT INHALER    Inhale 2 puffs into the lungs 2 (two) times daily.   DOXYCYCLINE (VIBRAMYCIN) 100 MG CAPSULE    Take 1 capsule (100 mg total) by mouth 2 (two) times daily.   HYDROCODONE-CHLORPHENIRAMINE 5-4 MG/5ML SOLN    Take by mouth every 8 (eight) hours as needed.   LOSARTAN (COZAAR) 50 MG TABLET    Take 1 tablet (50 mg total) by mouth daily.   PREDNISONE (DELTASONE) 20 MG TABLET    Take two tablets daily for 2 days followed by one tablet daily for 3 days   PREDNISONE  (DELTASONE) 50 MG TABLET    Take 50 mg by mouth daily with breakfast.

## 2015-03-27 NOTE — Progress Notes (Signed)
Pre visit review using our clinic review tool, if applicable. No additional management support is needed unless otherwise documented below in the visit note. 

## 2015-03-27 NOTE — Addendum Note (Signed)
Addended by: Ellamae Sia on: 03/27/2015 10:39 AM   Modules accepted: Orders

## 2015-03-28 LAB — HEPATITIS C ANTIBODY: HCV AB: NEGATIVE

## 2015-03-28 LAB — HIV ANTIBODY (ROUTINE TESTING W REFLEX): HIV: NONREACTIVE

## 2015-03-28 LAB — PATHOLOGIST SMEAR REVIEW

## 2015-04-01 ENCOUNTER — Other Ambulatory Visit: Payer: Self-pay | Admitting: Family Medicine

## 2015-04-01 ENCOUNTER — Telehealth: Payer: Self-pay

## 2015-04-01 DIAGNOSIS — D72821 Monocytosis (symptomatic): Secondary | ICD-10-CM

## 2015-04-01 NOTE — Telephone Encounter (Signed)
Reviewed on phone

## 2015-04-01 NOTE — Telephone Encounter (Signed)
Pt left v/m returning call to Dr Lorelei Pont about recent lab testing;pt request cb.

## 2015-04-12 ENCOUNTER — Telehealth: Payer: Self-pay | Admitting: *Deleted

## 2015-04-12 NOTE — Telephone Encounter (Signed)
Pt left voicemail at Triage. Pt has an appt with oncology on 04/23/15, it was originally on 04/18/15 but they called him back and rescheduled appt until 04/23/15. Pt said last tim his labs were abnormal he had c diff and a staff infections and he afraid that this is what is wrong with him now and he is nervous about waiting until the 13th to be seen. Pt is requesting a call back from Dr. Lorelei Pont directly to discuss this and his concerns

## 2015-04-16 NOTE — Telephone Encounter (Signed)
LMOM on machine and no answer by patient.   Butch Penny, please try to call again today.   I think the chance of his lab abnormalities being from C. Diff or Staph approaches zero. The labs he mentioned are from 2 years ago.   If he somehow has developed severe, unrelenting diarrhea or significant fevers, that may be a different story. He definitely should follow-up with Heme/Onc.  Electronically Signed  By: Owens Loffler, MD On: 04/16/2015 9:42 AM

## 2015-04-16 NOTE — Telephone Encounter (Signed)
Mr. [name] notified as instructed by telephone.  He will follow up with Heme/Onc on 04/23/2015 as scheduled.

## 2015-04-18 ENCOUNTER — Ambulatory Visit: Payer: 59 | Admitting: Oncology

## 2015-04-23 ENCOUNTER — Inpatient Hospital Stay: Payer: 59 | Attending: Oncology | Admitting: Oncology

## 2015-04-23 VITALS — BP 153/85 | HR 92 | Temp 97.4°F | Resp 16 | Ht 71.26 in | Wt 200.8 lb

## 2015-04-23 DIAGNOSIS — I252 Old myocardial infarction: Secondary | ICD-10-CM | POA: Insufficient documentation

## 2015-04-23 DIAGNOSIS — K219 Gastro-esophageal reflux disease without esophagitis: Secondary | ICD-10-CM | POA: Insufficient documentation

## 2015-04-23 DIAGNOSIS — Z8673 Personal history of transient ischemic attack (TIA), and cerebral infarction without residual deficits: Secondary | ICD-10-CM | POA: Diagnosis not present

## 2015-04-23 DIAGNOSIS — D72821 Monocytosis (symptomatic): Secondary | ICD-10-CM | POA: Insufficient documentation

## 2015-04-23 DIAGNOSIS — E785 Hyperlipidemia, unspecified: Secondary | ICD-10-CM | POA: Diagnosis not present

## 2015-04-23 DIAGNOSIS — I2510 Atherosclerotic heart disease of native coronary artery without angina pectoris: Secondary | ICD-10-CM | POA: Diagnosis not present

## 2015-04-23 DIAGNOSIS — I10 Essential (primary) hypertension: Secondary | ICD-10-CM | POA: Diagnosis not present

## 2015-04-23 DIAGNOSIS — J449 Chronic obstructive pulmonary disease, unspecified: Secondary | ICD-10-CM | POA: Diagnosis not present

## 2015-04-23 DIAGNOSIS — E119 Type 2 diabetes mellitus without complications: Secondary | ICD-10-CM | POA: Diagnosis not present

## 2015-04-23 DIAGNOSIS — I255 Ischemic cardiomyopathy: Secondary | ICD-10-CM | POA: Insufficient documentation

## 2015-04-23 DIAGNOSIS — I5022 Chronic systolic (congestive) heart failure: Secondary | ICD-10-CM | POA: Diagnosis not present

## 2015-04-23 DIAGNOSIS — Z79899 Other long term (current) drug therapy: Secondary | ICD-10-CM | POA: Diagnosis not present

## 2015-04-23 DIAGNOSIS — F17210 Nicotine dependence, cigarettes, uncomplicated: Secondary | ICD-10-CM | POA: Diagnosis not present

## 2015-04-23 NOTE — Progress Notes (Signed)
Patient had abnormal labs on recent lab check and the only concern he offers is fatigue.

## 2015-04-24 ENCOUNTER — Encounter: Payer: Self-pay | Admitting: Oncology

## 2015-04-24 ENCOUNTER — Inpatient Hospital Stay: Payer: 59

## 2015-04-24 DIAGNOSIS — D72821 Monocytosis (symptomatic): Secondary | ICD-10-CM

## 2015-04-24 LAB — CBC WITH DIFFERENTIAL/PLATELET
Basophils Absolute: 0 10*3/uL (ref 0–0.1)
Basophils Relative: 1 %
EOS ABS: 0 10*3/uL (ref 0–0.7)
Eosinophils Relative: 0 %
HEMATOCRIT: 39.3 % — AB (ref 40.0–52.0)
HEMOGLOBIN: 13.5 g/dL (ref 13.0–18.0)
LYMPHS ABS: 1.7 10*3/uL (ref 1.0–3.6)
Lymphocytes Relative: 41 %
MCH: 32.6 pg (ref 26.0–34.0)
MCHC: 34.3 g/dL (ref 32.0–36.0)
MCV: 95 fL (ref 80.0–100.0)
MONOS PCT: 37 %
Monocytes Absolute: 1.5 10*3/uL — ABNORMAL HIGH (ref 0.2–1.0)
NEUTROS PCT: 21 %
Neutro Abs: 0.9 10*3/uL — ABNORMAL LOW (ref 1.4–6.5)
Platelets: 129 10*3/uL — ABNORMAL LOW (ref 150–440)
RBC: 4.14 MIL/uL — ABNORMAL LOW (ref 4.40–5.90)
RDW: 14.8 % — ABNORMAL HIGH (ref 11.5–14.5)
WBC: 4.1 10*3/uL (ref 3.8–10.6)

## 2015-04-24 LAB — IRON AND TIBC
Iron: 102 ug/dL (ref 45–182)
Saturation Ratios: 27 % (ref 17.9–39.5)
TIBC: 380 ug/dL (ref 250–450)
UIBC: 278 ug/dL

## 2015-04-24 LAB — FERRITIN: FERRITIN: 102 ng/mL (ref 24–336)

## 2015-04-28 LAB — BCR-ABL1, CML/ALL, PCR, QUANT

## 2015-05-02 NOTE — Progress Notes (Signed)
[place]  [phone] [phone]  ID: Tony Watson OB: 08/19/1950  MR#: [id number]  JJH#:[id number]  Patient Care Team: Owens Loffler, MD as PCP - General  CHIEF COMPLAINT:  Chief Complaint  Patient presents with  . New Evaluation    abnormal labs    INTERVAL HISTORY: Patient is a 64 year old male who was found to have a persistently elevated monocytosis on routine blood work. He currently feels well and is asymptomatic. He denies any fevers, chills, or night sweats. He is a good appetite and denies weight loss. He denies any recent infections. He has no chest pain or shortness of breath. He denies any nausea, vomiting, constipation, or diarrhea. He has no urinary complaints. Patient feels at his baseline and offers no specific complaints today.  REVIEW OF SYSTEMS:   Review of Systems  Constitutional: Negative for fever, weight loss and malaise/fatigue.  Respiratory: Negative.   Cardiovascular: Negative.   Gastrointestinal: Negative.   Musculoskeletal: Negative.   Neurological: Negative.  Negative for weakness.  Endo/Heme/Allergies: Does not bruise/bleed easily.    As per HPI. Otherwise, a complete review of systems is negatve.  PAST MEDICAL HISTORY: Past Medical History  Diagnosis Date  . Ischemic cardiomyopathy     mild with EF 42% on myoviews 2008.  Marland Kitchen TIA (transient ischemic attack)     hx; now s/p PFO closure 2004 (in [place])  . DM2 (diabetes mellitus, type 2)     A1c 6.0% 12/11  . Smoker   . Diverticulosis 2002  . GERD (gastroesophageal reflux disease)   . Ulcer   . Generalized headaches   . Medical non-compliance   . Nicotine dependence     chronic, active  . COPD (chronic obstructive pulmonary disease)   . Seasonal allergic rhinitis   . CAD (coronary artery disease)     inferior MI 11/99 (in [place]) with PCI to [facility]. last [place] 2004 [facility]): EF 50%, patent RCA stent. no obstructive disease. last myoview 2008: EF 42%,  inferior infarct, no ischemia.   . Systolic heart failure secondary to coronary artery disease 05/30/2012  . HTN (hypertension)   . Hyperlipidemia   . Diabetic autonomic neuropathy associated with type 2 diabetes mellitus 03/27/2015    PAST SURGICAL HISTORY: Past Surgical History  Procedure Laterality Date  . Open heart surgery  2004    PFO repair  . Cardiac catheterization  11/99    CI per PMH    FAMILY HISTORY Family History  Problem Relation Age of Onset  . Coronary artery disease      family hx  . Breast cancer      1st egree relative <50       ADVANCED DIRECTIVES:    HEALTH MAINTENANCE: Social History  Substance Use Topics  . Smoking status: Current Every Day Smoker -- 1.00 packs/day for 45 years    Types: Cigarettes  . Smokeless tobacco: Never Used     Comment: 1 ppd +40 years  . Alcohol Use: Yes     Comment: beer weekly     Colonoscopy:  PAP:  Bone density:  Lipid panel:  No Known Allergies  Current Outpatient Prescriptions  Medication Sig Dispense Refill  . carvedilol (COREG) 3.125 MG tablet Take 1 tablet (3.125 mg total) by mouth 2 (two) times daily. 180 tablet 3  . gabapentin (NEURONTIN) 300 MG capsule Take 1 capsule (300 mg total) by mouth at bedtime. 90 capsule 3  . ipratropium-albuterol (DUONEB) 0.5-2.5 (3) MG/3ML SOLN USE 1 VIAL  VIA NEBULIZER EVERY 4 HOURS AS NEEDED 450 mL 3  . metFORMIN (GLUCOPHAGE-XR) 500 MG 24 hr tablet Take 1 tablet (500 mg total) by mouth daily with breakfast. 90 tablet 3  . tiZANidine (ZANAFLEX) 4 MG tablet Take 1 tablet (4 mg total) by mouth Nightly. 90 tablet 3   No current facility-administered medications for this visit.    OBJECTIVE: Filed Vitals:   04/23/15 1610  BP: 153/85  Pulse: 92  Temp: 97.4 F (36.3 C)  Resp: 16     Body mass index is 27.81 kg/(m^2).    ECOG FS:0 - Asymptomatic  General: Well-developed, well-nourished, no acute distress. Eyes: Pink conjunctiva, anicteric sclera. HEENT: Normocephalic,  moist mucous membranes, clear oropharnyx. Lungs: Clear to auscultation bilaterally. Heart: Regular rate and rhythm. No rubs, murmurs, or gallops. Abdomen: Soft, nontender, nondistended. No organomegaly noted, normoactive bowel sounds. Musculoskeletal: No edema, cyanosis, or clubbing. Neuro: Alert, answering all questions appropriately. Cranial nerves grossly intact. Skin: No rashes or petechiae noted. Psych: Normal affect. Lymphatics: No cervical, calvicular, axillary or inguinal LAD.   LAB RESULTS:  Lab Results  Component Value Date   NA 138 03/27/2015   K 4.0 03/27/2015   CL 103 03/27/2015   CO2 26 03/27/2015   GLUCOSE 131* 03/27/2015   BUN 14 03/27/2015   CREATININE 0.81 03/27/2015   CALCIUM 9.3 03/27/2015   PROT 7.5 03/27/2015   ALBUMIN 4.5 03/27/2015   AST 37 03/27/2015   ALT 34 03/27/2015   ALKPHOS 30* 03/27/2015   BILITOT 0.5 03/27/2015   GFRNONAA >60 01/21/2014   GFRAA >60 01/21/2014    Lab Results  Component Value Date   WBC 4.1 04/24/2015   NEUTROABS 0.9* 04/24/2015   HGB 13.5 04/24/2015   HCT 39.3* 04/24/2015   MCV 95.0 04/24/2015   PLT 129* 04/24/2015     STUDIES: No results found.  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  PLAN:    1. CMML: Patient's absolute monocytosis has been greater than 1.0 and persistent since at least July 2014. The remainder of his blood work including BCR-ABL and ANA are negative or within normal limits. Previously, hepatitis C and HIV were also negative. Patient will require a bone marrow biopsy in the future to confirm the diagnosis. He will likely need treatment with azacitidine or decitabine at some point, but not at this moment. He has no evidence of end organ involvement other than a mild thrombocytopenia which is also stable. Return to clinic in 3 months with repeat laboratory work and discussion of confirmatory bone marrow biopsy.  Patient expressed understanding and was in agreement with this plan. He also  understands that He can call clinic at any time with any questions, concerns, or complaints.     Lloyd Huger, MD   05/02/2015 9:33 AM

## 2015-06-03 LAB — COMP PANEL: LEUKEMIA/LYMPHOMA

## 2015-06-25 ENCOUNTER — Other Ambulatory Visit: Payer: Self-pay | Admitting: Family Medicine

## 2015-07-15 ENCOUNTER — Emergency Department
Admission: EM | Admit: 2015-07-15 | Discharge: 2015-07-15 | Disposition: A | Discharge status: Home / Self Care | Payer: 59 | Attending: Emergency Medicine | Admitting: Emergency Medicine

## 2015-07-15 ENCOUNTER — Telehealth: Payer: Self-pay | Admitting: Family Medicine

## 2015-07-15 ENCOUNTER — Emergency Department: Payer: 59

## 2015-07-15 ENCOUNTER — Encounter: Payer: Self-pay | Admitting: Emergency Medicine

## 2015-07-15 DIAGNOSIS — Z7984 Long term (current) use of oral hypoglycemic drugs: Secondary | ICD-10-CM | POA: Diagnosis not present

## 2015-07-15 DIAGNOSIS — Z792 Long term (current) use of antibiotics: Secondary | ICD-10-CM | POA: Diagnosis not present

## 2015-07-15 DIAGNOSIS — R05 Cough: Secondary | ICD-10-CM | POA: Diagnosis present

## 2015-07-15 DIAGNOSIS — E1151 Type 2 diabetes mellitus with diabetic peripheral angiopathy without gangrene: Secondary | ICD-10-CM | POA: Diagnosis not present

## 2015-07-15 DIAGNOSIS — F17210 Nicotine dependence, cigarettes, uncomplicated: Secondary | ICD-10-CM | POA: Diagnosis not present

## 2015-07-15 DIAGNOSIS — I10 Essential (primary) hypertension: Secondary | ICD-10-CM | POA: Diagnosis not present

## 2015-07-15 DIAGNOSIS — Z79899 Other long term (current) drug therapy: Secondary | ICD-10-CM | POA: Insufficient documentation

## 2015-07-15 DIAGNOSIS — E1143 Type 2 diabetes mellitus with diabetic autonomic (poly)neuropathy: Secondary | ICD-10-CM | POA: Insufficient documentation

## 2015-07-15 DIAGNOSIS — J189 Pneumonia, unspecified organism: Secondary | ICD-10-CM | POA: Insufficient documentation

## 2015-07-15 DIAGNOSIS — J441 Chronic obstructive pulmonary disease with (acute) exacerbation: Secondary | ICD-10-CM | POA: Diagnosis not present

## 2015-07-15 DIAGNOSIS — J181 Lobar pneumonia, unspecified organism: Secondary | ICD-10-CM

## 2015-07-15 LAB — COMPREHENSIVE METABOLIC PANEL
ALBUMIN: 4.1 g/dL (ref 3.5–5.0)
ALK PHOS: 31 U/L — AB (ref 38–126)
ALT: 26 U/L (ref 17–63)
AST: 30 U/L (ref 15–41)
Anion gap: 10 (ref 5–15)
BILIRUBIN TOTAL: 0.5 mg/dL (ref 0.3–1.2)
BUN: 14 mg/dL (ref 6–20)
CO2: 22 mmol/L (ref 22–32)
CREATININE: 0.87 mg/dL (ref 0.61–1.24)
Calcium: 9.1 mg/dL (ref 8.9–10.3)
Chloride: 99 mmol/L — ABNORMAL LOW (ref 101–111)
GFR calc Af Amer: >60 mL/min (ref >60)
GLUCOSE: 118 mg/dL — AB (ref 65–99)
Potassium: 3.7 mmol/L (ref 3.5–5.1)
Sodium: 131 mmol/L — ABNORMAL LOW (ref 135–145)
TOTAL PROTEIN: 8.3 g/dL — AB (ref 6.5–8.1)

## 2015-07-15 LAB — URINALYSIS COMPLETE WITH MICROSCOPIC (ARMC ONLY)
BILIRUBIN URINE: NEGATIVE
Bacteria, UA: NONE SEEN
Glucose, UA: NEGATIVE mg/dL
KETONES UR: NEGATIVE mg/dL
Leukocytes, UA: NEGATIVE
Nitrite: NEGATIVE
PH: 6 (ref 5.0–8.0)
PROTEIN: 100 mg/dL — AB
Specific Gravity, Urine: 1.019 (ref 1.005–1.030)

## 2015-07-15 LAB — CBC
HEMATOCRIT: 37.9 % — AB (ref 40.0–52.0)
HEMOGLOBIN: 12.8 g/dL — AB (ref 13.0–18.0)
MCH: 32.1 pg (ref 26.0–34.0)
MCHC: 33.8 g/dL (ref 32.0–36.0)
MCV: 95.1 fL (ref 80.0–100.0)
Platelets: 137 10*3/uL — ABNORMAL LOW (ref 150–440)
RBC: 3.99 MIL/uL — AB (ref 4.40–5.90)
RDW: 15.1 % — ABNORMAL HIGH (ref 11.5–14.5)
WBC: 24.9 10*3/uL — ABNORMAL HIGH (ref 3.8–10.6)

## 2015-07-15 LAB — TROPONIN I: TROPONIN I: 0.03 ng/mL (ref <0.031)

## 2015-07-15 MED ORDER — IOHEXOL 300 MG/ML  SOLN
75.0000 mL | Freq: Once | INTRAMUSCULAR | Status: AC | PRN
  Administered 2015-07-15: 75 mL via INTRAVENOUS
  Filled 2015-07-15: qty 75

## 2015-07-15 MED ORDER — HYDROCOD POLST-CPM POLST ER 10-8 MG/5ML PO SUER: 5.0000 mL | Freq: Two times a day (BID) | ORAL | Status: DC | PRN

## 2015-07-15 MED ORDER — IPRATROPIUM-ALBUTEROL 0.5-2.5 (3) MG/3ML IN SOLN
3.0000 mL | Freq: Once | RESPIRATORY_TRACT | Status: AC
  Administered 2015-07-15: 3 mL via RESPIRATORY_TRACT

## 2015-07-15 MED ORDER — LEVOFLOXACIN 750 MG PO TABS: 750.0000 mg | ORAL_TABLET | Freq: Every day | ORAL | Status: DC

## 2015-07-15 MED ORDER — IBUPROFEN 600 MG PO TABS
ORAL_TABLET | ORAL | Status: AC
  Filled 2015-07-15: qty 1

## 2015-07-15 MED ORDER — IPRATROPIUM-ALBUTEROL 0.5-2.5 (3) MG/3ML IN SOLN: 3.0000 mL | RESPIRATORY_TRACT | Status: DC | PRN

## 2015-07-15 MED ORDER — IPRATROPIUM-ALBUTEROL 0.5-2.5 (3) MG/3ML IN SOLN
RESPIRATORY_TRACT | Status: AC
  Filled 2015-07-15: qty 3

## 2015-07-15 MED ORDER — IBUPROFEN 600 MG PO TABS
600.0000 mg | ORAL_TABLET | Freq: Once | ORAL | Status: AC
Start: 2015-07-15 — End: 2015-07-15
  Administered 2015-07-15: 600 mg via ORAL
  Filled 2015-07-15: qty 1

## 2015-07-15 MED ORDER — LEVOFLOXACIN IN D5W 750 MG/150ML IV SOLN
750.0000 mg | Freq: Once | INTRAVENOUS | Status: AC
  Administered 2015-07-15: 750 mg via INTRAVENOUS
  Filled 2015-07-15: qty 150

## 2015-07-15 NOTE — ED Provider Notes (Signed)
[facility] Emergency Department Provider Note  ____________________________________________  Time seen: Approximately 12:05 PM  I have reviewed the triage vital signs and the nursing notes.   HISTORY  Chief Complaint Cough   HPI Tony Watson is a 64 y.o. male is here today with complaint of productive cough and fever 2 days. Patient states that his temperature was 102.9 this morning and he took Tylenol at home prior to his arrival in the emergency room. Patient states that it hurts on the left side of his chest when he coughs and lower back. Patient has a history of COPD and has a nebulizer treatment at home. He also gave himself a nebulizer treatment prior to his arrival in the emergency room.Patient denies any vomiting or diarrhea and states bowel movements have been normal. He states his appetite has been less but is continuing to drink fluids. Patient is a one pack a day smoker since age 31. Patient's primary care doctor is Dr. Edilia Bo at [facility].   Past Medical History  Diagnosis Date  . Ischemic cardiomyopathy     mild with EF 42% on myoviews 2008.  Marland Kitchen TIA (transient ischemic attack)     hx; now s/p PFO closure 2004 (in [place])  . DM2 (diabetes mellitus, type 2) (HCC)     A1c 6.0% 12/11  . Smoker   . Diverticulosis 2002  . GERD (gastroesophageal reflux disease)   . Ulcer   . Generalized headaches   . Medical non-compliance   . Nicotine dependence     chronic, active  . COPD (chronic obstructive pulmonary disease) ([place])   . Seasonal allergic rhinitis   . CAD (coronary artery disease)     inferior MI 11/99 (in [place]) with PCI to [facility]. last [place] 2004 [facility]): EF 50%, patent RCA stent. no obstructive disease. last myoview 2008: EF 42%, inferior infarct, no ischemia.   . Systolic heart failure secondary to coronary artery disease ([place]) 05/30/2012  . HTN (hypertension)   . Hyperlipidemia   . Diabetic autonomic neuropathy associated with  type 2 diabetes mellitus ([place]) 03/27/2015  . Asthma     Patient Active Problem List   Diagnosis Date Noted  . Diabetic autonomic neuropathy associated with type 2 diabetes mellitus ([place]) 03/27/2015  . History of inferior MI (myocardial infarction) 10/14/2013  . CAD (coronary artery disease)   . HTN (hypertension)   . History of noncompliance with medical treatment 05/30/2012  . Systolic heart failure secondary to coronary artery disease ([place]) 05/30/2012  . COPD, severe 03/23/2011  . ALLERGIC RHINITIS CAUSE UNSPECIFIED 07/31/2010  . Type 2 diabetes mellitus with vascular disease ([place]) 01/02/2010  . TOBACCO USE 04/17/2009  . PARESTHESIA 04/17/2009  . TRANSIENT ISCHEMIC ATTACKS, HX OF 04/17/2009  . Hyperlipidemia 12/19/2008  . HYPERTENSION, UNSPECIFIED 12/19/2008    Past Surgical History  Procedure Laterality Date  . Open heart surgery  2004    PFO repair  . Cardiac catheterization  11/99    CI per PMH    Current Outpatient Rx  Name  Route  Sig  Dispense  Refill  . carvedilol (COREG) 3.125 MG tablet   Oral   Take 1 tablet (3.125 mg total) by mouth 2 (two) times daily.   180 tablet   3   . chlorpheniramine-HYDROcodone (TUSSIONEX PENNKINETIC ER) 10-8 MG/5ML SUER   Oral   Take 5 mLs by mouth every 12 (twelve) hours as needed for cough.   140 mL   0   . gabapentin (  NEURONTIN) 300 MG capsule   Oral   Take 1 capsule (300 mg total) by mouth at bedtime.   90 capsule   3   . ipratropium-albuterol (DUONEB) 0.5-2.5 (3) MG/3ML SOLN   Nebulization   Take 3 mLs by nebulization every 4 (four) hours as needed.   360 mL   0   . levofloxacin (LEVAQUIN) 750 MG tablet   Oral   Take 1 tablet (750 mg total) by mouth daily.   7 tablet   0   . metFORMIN (GLUCOPHAGE-XR) 500 MG 24 hr tablet   Oral   Take 1 tablet (500 mg total) by mouth daily with breakfast.   90 tablet   3   . tiZANidine (ZANAFLEX) 4 MG tablet   Oral   Take 1 tablet (4 mg total) by mouth Nightly.   90  tablet   3     Allergies Review of patient's allergies indicates no known allergies.  Family History  Problem Relation Age of Onset  . Coronary artery disease      family hx  . Breast cancer      1st egree relative <50    Social History Social History  Substance Use Topics  . Smoking status: Current Every Day Smoker -- 1.00 packs/day for 45 years    Types: Cigarettes  . Smokeless tobacco: Never Used     Comment: 1 ppd +40 years  . Alcohol Use: Yes     Comment: beer weekly    Review of Systems Constitutional: Positive fever/chills Eyes: No visual changes. ENT: No sore throat. Cardiovascular: Positive left-sided chest pain with coughing. Respiratory: Positive shortness of breath with coughing and COPD. Gastrointestinal: No abdominal pain.  No nausea, no vomiting.  No diarrhea.  No constipation. Genitourinary: Negative for dysuria. Musculoskeletal: Negative for back pain. Skin: Negative for rash. Neurological: Negative for headaches, focal weakness or numbness.  10-point ROS otherwise negative.  ____________________________________________   PHYSICAL EXAM:  VITAL SIGNS: ED Triage Vitals  Enc Vitals Group     BP 07/15/15 1135 147/71 mmHg     Pulse Rate 07/15/15 1135 93     Resp 07/15/15 1135 20     Temp 07/15/15 1135 101.8 F (38.8 C)     Temp Source 07/15/15 1135 Oral     SpO2 07/15/15 1135 94 %     Weight 07/15/15 1135 195 lb (88.451 kg)     Height 07/15/15 1135 5\' 11"  (1.803 m)     Head Cir --      Peak Flow --      Pain Score 07/15/15 1137 10     Pain Loc --      Pain Edu? --      Excl. in [place]? --     Constitutional: Alert and oriented. Well appearing and in no acute distress.  Nontoxic Eyes: Conjunctivae are normal. PERRL. EOMI. Head: Atraumatic. Nose: No congestion/rhinnorhea.  EACs and TMs are clear bilaterally. Mouth/Throat: Mucous membranes are moist.  Oropharynx non-erythematous. Neck: No stridor.   Supple Hematological/Lymphatic/Immunilogical: No cervical lymphadenopathy. Cardiovascular: Normal rate, regular rhythm. Grossly normal heart sounds.  Good peripheral circulation. Respiratory: Normal respiratory effort.  No retractions. Lungs poor air exchange but no present rales or rhonchi noted. No wheezing was noted. Gastrointestinal: Soft and nontender. No distention. Musculoskeletal: No lower extremity tenderness nor edema.  Neurologic:  Normal speech and language. No gross focal neurologic deficits are appreciated.  Skin:  Skin is warm, dry and intact. No rash noted. Psychiatric: Mood and  affect are normal. Speech and behavior are normal.  ____________________________________________   LABS (all labs ordered are listed, but only abnormal results are displayed)  Labs Reviewed  URINALYSIS COMPLETEWITH MICROSCOPIC ([facility] ONLY) - Abnormal; Notable for the following:    Color, Urine YELLOW (*)    APPearance CLEAR (*)    Hgb urine dipstick 2+ (*)    Protein, ur 100 (*)    Squamous Epithelial / LPF 0-5 (*)    All other components within normal limits  CBC - Abnormal; Notable for the following:    WBC 24.9 (*)    RBC 3.99 (*)    Hemoglobin 12.8 (*)    HCT 37.9 (*)    RDW 15.1 (*)    Platelets 137 (*)    All other components within normal limits  COMPREHENSIVE METABOLIC PANEL - Abnormal; Notable for the following:    Sodium 131 (*)    Chloride 99 (*)    Glucose, Bld 118 (*)    Total Protein 8.3 (*)    Alkaline Phosphatase 31 (*)    All other components within normal limits  CULTURE, BLOOD (ROUTINE X 2)  CULTURE, BLOOD (ROUTINE X 2)  TROPONIN I   ____________________________________________  EKG  Per Dr. Corky Downs ____________________________________________  RADIOLOGY  Chest x-ray per radiologist right upper lobe pneumonia. I, Tony Watson, personally viewed and evaluated these images (plain radiographs) as part of my medical decision making.   CT scan of the chest  per radiologist shows focal right upper lobe consolidation consistent with pneumonia. Trace right pleural effusion. Borderline pericardial and adenopathy possibly reactive but nonspecific. Also set up with solid nodules in both lower lungs probably infectious/inflammatory. ____________________________________________   PROCEDURES  Procedure(s) performed: None  Critical Care performed: No  ____________________________________________   INITIAL IMPRESSION / ASSESSMENT AND PLAN / ED COURSE  Pertinent labs & imaging results that were available during my care of the patient were reviewed by me and considered in my medical decision making (see chart for details).  Patient has an appointment with cancer Center on the 13th due to his abnormal lab work at his primary care doctor. Patient discharge plan was discussed with patient who is comfortable. Patient received Levaquin IV while in the emergency room waiting on lab work. Patient has a nebulizer machine at home with DuoNeb from his PCP. He was discharged on a prescription of Levaquin 750, Tussionex as needed for cough. His continue his routine medications and return to the emergency room if any severe worsening or urgent concerns.  Discharge plans were discussed with Dr. Jimmye Norman and also with Dr. Joni Fears prior to patient's discharge. ____________________________________________   FINAL CLINICAL IMPRESSION(S) / ED DIAGNOSES  Final diagnoses:  Right upper lobe pneumonia  Cigarette smoker      Tony Hai, PA-C 07/15/15 [place], MD 07/16/15 [phone]

## 2015-07-15 NOTE — Discharge Instructions (Signed)
Community-Acquired Pneumonia, Adult Pneumonia is an infection of the lungs. One type of pneumonia can happen while a person is in a hospital. A different type can happen when a person is not in a hospital (community-acquired pneumonia). It is easy for this kind to spread from person to person. It can spread to you if you breathe near an infected person who coughs or sneezes. Some symptoms include:  A dry cough.  A wet (productive) cough.  Fever.  Sweating.  Chest pain. HOME CARE  Take over-the-counter and prescription medicines only as told by your doctor.  Only take cough medicine if you are losing sleep.  If you were prescribed an antibiotic medicine, take it as told by your doctor. Do not stop taking the antibiotic even if you start to feel better.  Sleep with your head and neck raised (elevated). You can do this by putting a few pillows under your head, or you can sleep in a recliner.  Do not use tobacco products. These include cigarettes, chewing tobacco, and e-cigarettes. If you need help quitting, ask your doctor.  Drink enough water to keep your pee (urine) clear or pale yellow. A shot (vaccine) can help prevent pneumonia. Shots are often suggested for:  People older than 63 years of age.  People older than 63 years of age:  Who are having cancer treatment.  Who have long-term (chronic) lung disease.  Who have problems with their body's defense system (immune system). You may also prevent pneumonia if you take these actions:  Get the flu (influenza) shot every year.  Go to the dentist as often as told.  Wash your hands often. If soap and water are not available, use hand sanitizer. GET HELP IF:  You have a fever.  You lose sleep because your cough medicine does not help. GET HELP RIGHT AWAY IF:  You are short of breath and it gets worse.  You have more chest pain.  Your sickness gets worse. This is very serious if:  You are an older adult.  Your  body's defense system is weak.  You cough up blood.   This information is not intended to replace advice given to you by your health care provider. Make sure you discuss any questions you have with your health care provider.   Document Released: 01/13/2008 Document Revised: 04/17/2015 Document Reviewed: 11/21/2014 Elsevier Interactive Patient Education 2016 [place] will need to make an appointment with Dr. Edilia Bo for follow-up by the end of this week. Call the office for an appointment Begin Taking Levaquin tomorrow. DuoNeb treatments for your nebulizer machine to help with wheezing. Tussionex is for coughing to be taken every 12 hours if needed. The aware that this may cause drowsiness. Return to the emergency room if any severe worsening of your condition or urgent concerns. You may also continue taking Tylenol if needed for fever.

## 2015-07-15 NOTE — Telephone Encounter (Signed)
Per chart review pt is at [facility] ED now. 

## 2015-07-15 NOTE — Telephone Encounter (Signed)
[place]  Patient Name: Tony Watson  DOB: 1951/05/10    Initial Comment Caller states her husband is weak and has a fever of 102.9.   Nurse Assessment  Nurse: Wayne Sever, RN, Tillie Rung Date/Time (Eastern Time): 07/15/2015 11:04:24 AM  Confirm and document reason for call. If symptomatic, describe symptoms. ---Caller states she had to call ambulance for husband because he told her he was to weak to be taken to doctor. She states the ambulance is taking him to ER now.  Has the patient traveled out of the country within the last 30 days? ---Not Applicable  Does the patient have any new or worsening symptoms? ---Yes  Will a triage be completed? ---No  Select reason for no triage. ---Other  Please document clinical information provided and list any resource used. ---In route to ED via EMS at this time     Guidelines    Guideline Title Affirmed Question Affirmed Notes       Final Disposition User        Referrals  [facility] - ED

## 2015-07-15 NOTE — ED Provider Notes (Signed)
Patient is in no acute distress, wheezing and does sound like he has COPD. He was given DuoNeb since steroids, with concerning finding on his chest x-ray which she will need CT with contrast. This will determine his disposition.  Earleen Newport, MD 07/15/15 [phone]

## 2015-07-15 NOTE — ED Provider Notes (Signed)
ED ECG REPORT I, Lavonia Drafts, the attending physician, personally viewed and interpreted this ECG.   Date: 07/15/2015  EKG Time: 2:07 PM  Rate: 107  Rhythm: sinus tachycardia  Axis: Right axis deviation  Intervals:right bundle branch block  ST&T Change: Nonspecific   Lavonia Drafts, MD 07/15/15 1415

## 2015-07-15 NOTE — ED Notes (Signed)
Cough and fever since yesterday, fever at home

## 2015-07-15 NOTE — ED Provider Notes (Signed)
Medical screening examination/treatment/procedure(s) were performed by non-physician practitioner and as supervising physician I was immediately available for consultation/collaboration.    Earleen Newport, MD 07/15/15 [phone]

## 2015-07-15 NOTE — ED Notes (Signed)
Pt presents with cough and fever since Saturday night. Pt states fever was 102.9 this morning and he took tylenol for it at home. Pt states he has pain when he coughs, as well as lower back pain. Pt alert & oriented with hx of COPD, so not sure when the cough started. Fever started Saturday night. NAD noted.

## 2015-07-16 ENCOUNTER — Ambulatory Visit: Payer: 59 | Admitting: Family Medicine

## 2015-07-19 ENCOUNTER — Encounter: Payer: Self-pay | Admitting: Family Medicine

## 2015-07-19 ENCOUNTER — Ambulatory Visit (INDEPENDENT_AMBULATORY_CARE_PROVIDER_SITE_OTHER): Payer: 59 | Admitting: Family Medicine

## 2015-07-19 ENCOUNTER — Telehealth: Payer: Self-pay | Admitting: *Deleted

## 2015-07-19 VITALS — BP 118/64 | HR 105 | Temp 99.1°F | Ht 71.25 in | Wt 194.5 lb

## 2015-07-19 DIAGNOSIS — D72821 Monocytosis (symptomatic): Secondary | ICD-10-CM | POA: Diagnosis not present

## 2015-07-19 DIAGNOSIS — J181 Lobar pneumonia, unspecified organism: Principal | ICD-10-CM

## 2015-07-19 DIAGNOSIS — J189 Pneumonia, unspecified organism: Secondary | ICD-10-CM | POA: Insufficient documentation

## 2015-07-19 DIAGNOSIS — R918 Other nonspecific abnormal finding of lung field: Secondary | ICD-10-CM | POA: Diagnosis not present

## 2015-07-19 DIAGNOSIS — J441 Chronic obstructive pulmonary disease with (acute) exacerbation: Secondary | ICD-10-CM

## 2015-07-19 LAB — CBC WITH DIFFERENTIAL/PLATELET
BASOS ABS: 0 10*3/uL (ref 0.0–0.1)
Basophils Relative: 0.2 % (ref 0.0–3.0)
EOS ABS: 0 10*3/uL (ref 0.0–0.7)
Eosinophils Relative: 0.3 % (ref 0.0–5.0)
HEMATOCRIT: 35.3 % — AB (ref 39.0–52.0)
HEMOGLOBIN: 11.9 g/dL — AB (ref 13.0–17.0)
LYMPHS PCT: 16.6 % (ref 12.0–46.0)
Lymphs Abs: 2 10*3/uL (ref 0.7–4.0)
MCHC: 33.9 g/dL (ref 30.0–36.0)
MCV: 94.6 fl (ref 78.0–100.0)
Monocytes Absolute: 5.2 10*3/uL — ABNORMAL HIGH (ref 0.1–1.0)
Monocytes Relative: 42.9 % — ABNORMAL HIGH (ref 3.0–12.0)
Neutro Abs: 4.8 10*3/uL (ref 1.4–7.7)
Neutrophils Relative %: 40 % — ABNORMAL LOW (ref 43.0–77.0)
Platelets: 148 10*3/uL — ABNORMAL LOW (ref 150.0–400.0)
RBC: 3.73 Mil/uL — AB (ref 4.22–5.81)
RDW: 14.6 % (ref 11.5–15.5)
WBC: 12.1 10*3/uL — ABNORMAL HIGH (ref 4.0–10.5)

## 2015-07-19 MED ORDER — PREDNISONE 20 MG PO TABS: ORAL_TABLET | ORAL | Status: DC

## 2015-07-19 MED ORDER — LEVOFLOXACIN 750 MG PO TABS: 750.0000 mg | ORAL_TABLET | Freq: Every day | ORAL | Status: AC

## 2015-07-19 MED ORDER — CEFTRIAXONE SODIUM 1 G IJ SOLR
1.0000 g | Freq: Once | INTRAMUSCULAR | Status: AC
  Administered 2015-07-19: 1 g via INTRAMUSCULAR

## 2015-07-19 NOTE — Patient Instructions (Addendum)
Start prednisone taper for COPD  Given injection of antibiotics today.  Use Duonebs as needed.  Stop at lab on way out. Continue levaquin for additional time. Prescription given for 7 more days of antibiotics.  Keep appt with Oncologist.  Will need repeat CT chest in 3 months.. Dr. Lorelei Pont will schedule.  Call if not improving or in fever continuing early next week. Quit smoking. Tony Watson

## 2015-07-19 NOTE — Assessment & Plan Note (Addendum)
Given continued fever. Will treat with ceftriaxone injection today.  Given on Levaquin with  broad coverage and about 50% clinical improvement despite fever... Continue for additional 7 days.  If fever not resoloving .Marland Kitchen Call ASAP.

## 2015-07-19 NOTE — Assessment & Plan Note (Signed)
Treat with steroid taper. Continue Duonebs as needed.

## 2015-07-19 NOTE — Assessment & Plan Note (Signed)
Re-eval with CT chest in 3 months.. Note sent to patients PCP.

## 2015-07-19 NOTE — Telephone Encounter (Signed)
Pt was in office on today for PNA follow up. Wife states that he was to receive prednisone and additional tabs of levaquin, and she is currently at the pharmacy and meds are not available. Note indicates pt was to start steroid, but no record of which one he was to receive. Pts wife is very upset, but was advised pt does not have DPR on file to discuss further with her. pls advise

## 2015-07-19 NOTE — Progress Notes (Signed)
Subjective:    Patient ID: Tony Watson, male    DOB: 1950/10/14, 64 y.o.   MRN: [id number]  HPI  64 year old male pt with history of COPD of Dr. Lillie Fragmin presents for ER follow up for  RUL pneumonia.  He was seen on 12/5  At [facility] for fever 102F , productive cough  Patient is a one pack a day smoker since age 70 WBC 29 hg 12.8  plt 137  blood culture: no growth as of yet.  EKG stable  Abnormal CXR  CT scan of the chest per radiologist showed focal right upper lobe consolidation consistent with pneumonia. Trace right pleural effusion. Borderline pericardial and adenopathy possibly reactive but nonspecific. Also solid nodules in both lower lungs probably infectious/inflammatory. follow-up by chest CT without contrast is recommended in 3 months to confirm appropriate resolution and exclude persistence/neoplasm  Patient received Levaquin IV while in the emergency room. Patient has a nebulizer machine at home with DuoNeb he is using as needed. He was discharged on a prescription of Levaquin 750, Tussionex as needed for cough.   No steroid given.  Since he has been home he has felt weak, nauseous. He has been taking antibiotics daily. Still having fever few days ago102 F, low grade today despite antibiotics. Breathing is better, no chest pain any longer. Still coughing, productive. Decrease appetite, keeping up with fluids. Using Duonebs once every 4 hours.  No clear sign of aspiration.  Social History /Family History/Past Medical History reviewed and updated if needed. Followed by Dr. 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 Pt has appt for follow up this month 12/13.  Review of Systems  Constitutional: Positive for fever and fatigue.  HENT: Negative for ear pain.   Eyes: Negative for pain.  Respiratory: Positive for cough and shortness of breath.        Objective:   Physical Exam  Constitutional: Vital signs are normal. He appears  well-developed and well-nourished.  Non-toxic appearance. He appears ill. No distress.  HENT:  Head: Normocephalic and atraumatic.  Right Ear: Hearing, tympanic membrane, external ear and ear canal normal. No tenderness. No foreign bodies. Tympanic membrane is not retracted and not bulging.  Left Ear: Hearing, tympanic membrane, external ear and ear canal normal. No tenderness. No foreign bodies. Tympanic membrane is not retracted and not bulging.  Nose: Nose normal. No mucosal edema or rhinorrhea. Right sinus exhibits no maxillary sinus tenderness and no frontal sinus tenderness. Left sinus exhibits no maxillary sinus tenderness and no frontal sinus tenderness.  Mouth/Throat: Uvula is midline, oropharynx is clear and moist and mucous membranes are normal. Normal dentition. No dental caries. No oropharyngeal exudate or tonsillar abscesses.  Eyes: Conjunctivae, EOM and lids are normal. Pupils are equal, round, and reactive to light. Lids are everted and swept, no foreign bodies found.  Neck: Trachea normal, normal range of motion and phonation normal. Neck supple. Carotid bruit is not present. No thyroid mass and no thyromegaly present.  Cardiovascular: Normal rate, regular rhythm, S1 normal, S2 normal, normal heart sounds, intact distal pulses and normal pulses.  Exam reveals no gallop.   No murmur heard. Pulmonary/Chest: Effort normal. No respiratory distress. He has decreased breath sounds. He has no wheezes. He has rhonchi in the right upper field and the right middle field. He has no rales.  Abdominal: Soft. Normal appearance and bowel sounds are normal. There is no hepatosplenomegaly. There is no tenderness. There is no rebound, no  guarding and no CVA tenderness. No hernia.  Neurological: He is alert. He has normal reflexes.  Skin: Skin is warm, dry and intact. No rash noted.  Psychiatric: He has a normal mood and affect. His speech is normal and behavior is normal. Judgment normal.           Assessment & Plan:

## 2015-07-19 NOTE — Progress Notes (Signed)
Pre visit review using our clinic review tool, if applicable. No additional management support is needed unless otherwise documented below in the visit note. 

## 2015-07-19 NOTE — Telephone Encounter (Signed)
Dr. Diona Browner contacted and prescriptions were sent in to [facility] as instructed by Dr. Diona Browner.  Mr. Discher notified.

## 2015-07-20 LAB — CULTURE, BLOOD (ROUTINE X 2)
Culture: NO GROWTH
Culture: NO GROWTH

## 2015-07-22 MED ORDER — ONDANSETRON 4 MG PO TBDP
4.0000 mg | ORAL_TABLET | Freq: Four times a day (QID) | ORAL | Status: DC | PRN
Start: 2015-07-22 — End: 2015-10-28

## 2015-07-22 NOTE — Addendum Note (Signed)
Addended by: Carter Kitten on: 07/22/2015 02:34 PM   Modules accepted: Orders

## 2015-07-23 ENCOUNTER — Inpatient Hospital Stay (HOSPITAL_BASED_OUTPATIENT_CLINIC_OR_DEPARTMENT_OTHER): Payer: 59 | Admitting: Oncology

## 2015-07-23 ENCOUNTER — Inpatient Hospital Stay: Payer: 59 | Attending: Oncology

## 2015-07-23 VITALS — BP 147/80 | HR 89 | Temp 97.4°F | Resp 16 | Wt 196.9 lb

## 2015-07-23 DIAGNOSIS — I10 Essential (primary) hypertension: Secondary | ICD-10-CM | POA: Diagnosis not present

## 2015-07-23 DIAGNOSIS — I255 Ischemic cardiomyopathy: Secondary | ICD-10-CM | POA: Insufficient documentation

## 2015-07-23 DIAGNOSIS — F17210 Nicotine dependence, cigarettes, uncomplicated: Secondary | ICD-10-CM | POA: Diagnosis not present

## 2015-07-23 DIAGNOSIS — J449 Chronic obstructive pulmonary disease, unspecified: Secondary | ICD-10-CM | POA: Insufficient documentation

## 2015-07-23 DIAGNOSIS — I252 Old myocardial infarction: Secondary | ICD-10-CM | POA: Insufficient documentation

## 2015-07-23 DIAGNOSIS — Z79899 Other long term (current) drug therapy: Secondary | ICD-10-CM | POA: Insufficient documentation

## 2015-07-23 DIAGNOSIS — E785 Hyperlipidemia, unspecified: Secondary | ICD-10-CM | POA: Insufficient documentation

## 2015-07-23 DIAGNOSIS — E119 Type 2 diabetes mellitus without complications: Secondary | ICD-10-CM | POA: Diagnosis not present

## 2015-07-23 DIAGNOSIS — K219 Gastro-esophageal reflux disease without esophagitis: Secondary | ICD-10-CM | POA: Diagnosis not present

## 2015-07-23 DIAGNOSIS — I5022 Chronic systolic (congestive) heart failure: Secondary | ICD-10-CM | POA: Insufficient documentation

## 2015-07-23 DIAGNOSIS — Z7984 Long term (current) use of oral hypoglycemic drugs: Secondary | ICD-10-CM | POA: Diagnosis not present

## 2015-07-23 DIAGNOSIS — C9311 Chronic myelomonocytic leukemia, in remission: Secondary | ICD-10-CM

## 2015-07-23 DIAGNOSIS — R918 Other nonspecific abnormal finding of lung field: Secondary | ICD-10-CM | POA: Diagnosis not present

## 2015-07-23 DIAGNOSIS — Z8673 Personal history of transient ischemic attack (TIA), and cerebral infarction without residual deficits: Secondary | ICD-10-CM | POA: Diagnosis not present

## 2015-07-23 DIAGNOSIS — Z951 Presence of aortocoronary bypass graft: Secondary | ICD-10-CM | POA: Insufficient documentation

## 2015-07-23 DIAGNOSIS — I2510 Atherosclerotic heart disease of native coronary artery without angina pectoris: Secondary | ICD-10-CM | POA: Diagnosis not present

## 2015-07-23 DIAGNOSIS — D696 Thrombocytopenia, unspecified: Secondary | ICD-10-CM | POA: Diagnosis not present

## 2015-07-23 DIAGNOSIS — J45909 Unspecified asthma, uncomplicated: Secondary | ICD-10-CM | POA: Diagnosis not present

## 2015-07-23 DIAGNOSIS — D72821 Monocytosis (symptomatic): Secondary | ICD-10-CM

## 2015-07-23 DIAGNOSIS — R911 Solitary pulmonary nodule: Secondary | ICD-10-CM

## 2015-07-23 NOTE — Progress Notes (Signed)
Patient was diagnosed with pneumonia last week and his energy has been low.

## 2015-07-29 ENCOUNTER — Telehealth: Payer: Self-pay | Admitting: Family Medicine

## 2015-07-29 NOTE — Telephone Encounter (Signed)
Pt called stating he was in hospital @[facility]  on 07/15/15 for pneumonia and he saw dr Diona Browner on 07/19/15.  He needs a note stating he can go back to work He said he has been out of work. Manager name Marsa Aris  Fax [phone]

## 2015-07-30 NOTE — Telephone Encounter (Signed)
Spoke with [name].  He states his symtoms of pneumonia have resolved and he feels great.  Returned to work yesterday 07/29/2015.  Work note written to return to work on 07/29/2015 and faxed to Marsa Aris at [phone].

## 2015-07-30 NOTE — Telephone Encounter (Signed)
If fever, cougha nd SOB resolved okay to write note for return to work.

## 2015-08-02 ENCOUNTER — Other Ambulatory Visit: Payer: Self-pay | Admitting: Oncology

## 2015-08-02 NOTE — Progress Notes (Signed)
[facility]  [phone] [phone]  ID: DRACEN REIGLE OB: 1950/10/15  MR#: [id number]  GMW#:[id number]  Patient Care Team: Owens Loffler, MD as PCP - General  CHIEF COMPLAINT:  Chief Complaint  Patient presents with  . Monocytosis    INTERVAL HISTORY: Patient returns to clinic today for repeat laboratory work and further evaluation. He was recently diagnosed with pneumonia and has had increased weakness and fatigue, but otherwise feels well. He denies any fevers, chills, or night sweats. He has a good appetite and denies weight loss. He has no chest pain or shortness of breath. He denies any nausea, vomiting, constipation, or diarrhea. He has no urinary complaints. Patient offers no further specific complaints today.  REVIEW OF SYSTEMS:   Review of Systems  Constitutional: Positive for malaise/fatigue. Negative for fever and weight loss.  Respiratory: Positive for cough.   Cardiovascular: Negative.   Gastrointestinal: Negative.   Musculoskeletal: Negative.   Neurological: Positive for weakness.  Endo/Heme/Allergies: Does not bruise/bleed easily.    As per HPI. Otherwise, a complete review of systems is negatve.  PAST MEDICAL HISTORY: Past Medical History  Diagnosis Date  . Ischemic cardiomyopathy     mild with EF 42% on myoviews 2008.  Marland Kitchen TIA (transient ischemic attack)     hx; now s/p PFO closure 2004 (in [place])  . DM2 (diabetes mellitus, type 2) (HCC)     A1c 6.0% 12/11  . Smoker   . Diverticulosis 2002  . GERD (gastroesophageal reflux disease)   . Ulcer   . Generalized headaches   . Medical non-compliance   . Nicotine dependence     chronic, active  . COPD (chronic obstructive pulmonary disease) ([place])   . Seasonal allergic rhinitis   . CAD (coronary artery disease)     inferior MI 11/99 (in [place]) with PCI to [facility]. last [place] 2004 [facility]): EF 50%, patent RCA stent. no obstructive disease. last myoview 2008: EF 42%, inferior  infarct, no ischemia.   . Systolic heart failure secondary to coronary artery disease ([place]) 05/30/2012  . HTN (hypertension)   . Hyperlipidemia   . Diabetic autonomic neuropathy associated with type 2 diabetes mellitus ([place]) 03/27/2015  . Asthma     PAST SURGICAL HISTORY: Past Surgical History  Procedure Laterality Date  . Open heart surgery  2004    PFO repair  . Cardiac catheterization  11/99    CI per PMH    FAMILY HISTORY Family History  Problem Relation Age of Onset  . Coronary artery disease      family hx  . Breast cancer      1st egree relative <50       ADVANCED DIRECTIVES:    HEALTH MAINTENANCE: Social History  Substance Use Topics  . Smoking status: Current Every Day Smoker -- 1.00 packs/day for 45 years    Types: Cigarettes  . Smokeless tobacco: Never Used     Comment: 1 ppd +40 years  . Alcohol Use: Yes     Comment: beer weekly     Colonoscopy:  PAP:  Bone density:  Lipid panel:  No Known Allergies  Current Outpatient Prescriptions  Medication Sig Dispense Refill  . carvedilol (COREG) 3.125 MG tablet Take 1 tablet (3.125 mg total) by mouth 2 (two) times daily. 180 tablet 3  . chlorpheniramine-HYDROcodone (TUSSIONEX PENNKINETIC ER) 10-8 MG/5ML SUER Take 5 mLs by mouth every 12 (twelve) hours as needed for cough. 140 mL 0  . gabapentin (NEURONTIN) 300 MG capsule  Take 1 capsule (300 mg total) by mouth at bedtime. 90 capsule 3  . ipratropium-albuterol (DUONEB) 0.5-2.5 (3) MG/3ML SOLN Take 3 mLs by nebulization every 4 (four) hours as needed. 360 mL 0  . metFORMIN (GLUCOPHAGE-XR) 500 MG 24 hr tablet Take 1 tablet (500 mg total) by mouth daily with breakfast. 90 tablet 3  . ondansetron (ZOFRAN ODT) 4 MG disintegrating tablet Take 1 tablet (4 mg total) by mouth every 6 (six) hours as needed for nausea or vomiting. 30 tablet 0  . predniSONE (DELTASONE) 20 MG tablet Take 3 tablets daily for 3 days, then 2 tablets daily for 2 days, then one tablet daily for 2  days. 15 tablet 0  . tiZANidine (ZANAFLEX) 4 MG tablet Take 1 tablet (4 mg total) by mouth Nightly. 90 tablet 3   No current facility-administered medications for this visit.    OBJECTIVE: Filed Vitals:   07/23/15 1047  BP: 147/80  Pulse: 89  Temp: 97.4 F (36.3 C)  Resp: 16     Body mass index is 27.26 kg/(m^2).    ECOG FS:0 - Asymptomatic  General: Well-developed, well-nourished, no acute distress. Eyes: Pink conjunctiva, anicteric sclera. Lungs: Clear to auscultation bilaterally. Heart: Regular rate and rhythm. No rubs, murmurs, or gallops. Abdomen: Soft, nontender, nondistended. No organomegaly noted, normoactive bowel sounds. Musculoskeletal: No edema, cyanosis, or clubbing. Neuro: Alert, answering all questions appropriately. Cranial nerves grossly intact. Skin: No rashes or petechiae noted. Psych: Normal affect.   LAB RESULTS:  Lab Results  Component Value Date   NA 131* 07/15/2015   K 3.7 07/15/2015   CL 99* 07/15/2015   CO2 22 07/15/2015   GLUCOSE 118* 07/15/2015   BUN 14 07/15/2015   CREATININE 0.87 07/15/2015   CALCIUM 9.1 07/15/2015   PROT 8.3* 07/15/2015   ALBUMIN 4.1 07/15/2015   AST 30 07/15/2015   ALT 26 07/15/2015   ALKPHOS 31* 07/15/2015   BILITOT 0.5 07/15/2015   GFRNONAA >60 07/15/2015   GFRAA >60 07/15/2015    Lab Results  Component Value Date   WBC 12.1* 07/19/2015   NEUTROABS 4.8 07/19/2015   HGB 11.9* 07/19/2015   HCT 35.3* 07/19/2015   MCV 94.6 07/19/2015   PLT 148.0* 07/19/2015     STUDIES: Dg Chest 2 View  07/15/2015  CLINICAL DATA:  Fever, cough, wheezing for 3 days. EXAM: CHEST  2 VIEW COMPARISON:  10/01/2014 FINDINGS: Area of consolidation in the right upper lobe concerning for pneumonia. Left lung is clear. Prior CABG. Heart is normal size. No effusions or acute bony abnormality. IMPRESSION: Right upper lobe pneumonia. Followup PA and lateral chest X-ray is recommended in 3-4 weeks following trial of antibiotic therapy to  ensure resolution and exclude underlying malignancy. Electronically Signed   By: Rolm Baptise M.D.   On: 07/15/2015 12:42   Ct Chest W Contrast  07/15/2015  CLINICAL DATA:  Cough, febrile, leukocytosis, PNA on cxr, smoker, hx of OHS and MI EXAM: CT CHEST WITH CONTRAST TECHNIQUE: Multidetector CT imaging of the chest was performed during intravenous contrast administration. CONTRAST:  39m OMNIPAQUE IOHEXOL 300 MG/ML  SOLN COMPARISON:  CT abdomen 02/05/2013 FINDINGS: Focal pleural-based airspace consolidation laterally in the right upper lobe. No evidence of trans pleural chest wall extension. 5 mm sub solid nodule in the superior segment left lower lobe image 41/3. 4 mm subpleural nodule in the superior segment right lower lobe image 41/3, and a similar 7 mm nodule slightly inferiorly on image 44. Linear scarring or subsegmental atelectasis  posterior in the posterior basal segment right lower lobe. Trace right pleural effusion. No pericardial effusion. Previous CABG. Extensive coronary calcifications. Precarinal adenopathy measuring up to 10 mm short axis diameter. No hilar adenopathy. Minimal spondylitic changes in the thoracic spine. Spinal stenosis L1-2, incompletely evaluated. Remainder of Visualized portions of upper abdomen unremarkable. IMPRESSION: 1. Focal right upper lobe airspace consolidation consistent with pneumonia. 2. Trace right pleural effusion without suggestion of loculation or pleural enhancement. 3. Borderline precarinal and adenopathy, possibly reactive but nonspecific. 4. Subcentimeter sub solid nodules in both lower lobes as above. Probably infectious/ inflammatory. Initial follow-up by chest CT without contrast is recommended in 3 months to confirm appropriate resolution and exclude persistence/neoplasm. This recommendation follows the consensus statement: Recommendations for the Management of Subsolid Pulmonary Nodules Detected at CT: A Statement from the [employer] as published  in Radiology 2013; 266:304-317. Electronically Signed   By: Lucrezia Europe M.D.   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  PLAN:    1. CMML: Patient's absolute monocytosis has been greater than 1.0 and persistent since at least July 2014. The remainder of his blood work including BCR-ABL and ANA are negative or within normal limits. Previously, hepatitis C and HIV were also negative. After lengthy discussion with the patient, he has agreed to pursue bone marrow biopsy to obtain a definitive diagnosis. He wishes to delay this procedure until after the holidays so he will return to clinic in January for his biopsy. He will likely need treatment with azacitidine or decitabine at some point, but not at this moment. He has no evidence of end organ involvement other than a mild thrombocytopenia which is also stable. Return to clinic in 3 months for further evaluation. 2. 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 Bone marrow biopsy as above. 3. Pulmonary nodule: Incidental finding upon diagnosis of pneumonia. Repeat CT scan in 3 months to assess for interval change.  Patient expressed understanding and was in agreement with this plan. He also understands that He can call clinic at any time with any questions, concerns, or complaints.     Lloyd Huger, MD   08/02/2015 3:24 PM

## 2015-08-29 ENCOUNTER — Other Ambulatory Visit: Payer: Self-pay | Admitting: Radiology

## 2015-08-30 ENCOUNTER — Encounter: Payer: Self-pay | Admitting: Oncology

## 2015-08-30 ENCOUNTER — Ambulatory Visit
Admission: RE | Admit: 2015-08-30 | Discharge: 2015-08-30 | Disposition: A | Discharge status: Home / Self Care | Payer: 59 | Source: Ambulatory Visit | Attending: Oncology | Admitting: Oncology

## 2015-08-30 DIAGNOSIS — Z955 Presence of coronary angioplasty implant and graft: Secondary | ICD-10-CM | POA: Insufficient documentation

## 2015-08-30 DIAGNOSIS — E785 Hyperlipidemia, unspecified: Secondary | ICD-10-CM | POA: Diagnosis not present

## 2015-08-30 DIAGNOSIS — I5022 Chronic systolic (congestive) heart failure: Secondary | ICD-10-CM | POA: Diagnosis not present

## 2015-08-30 DIAGNOSIS — J45909 Unspecified asthma, uncomplicated: Secondary | ICD-10-CM | POA: Insufficient documentation

## 2015-08-30 DIAGNOSIS — E119 Type 2 diabetes mellitus without complications: Secondary | ICD-10-CM | POA: Insufficient documentation

## 2015-08-30 DIAGNOSIS — K219 Gastro-esophageal reflux disease without esophagitis: Secondary | ICD-10-CM | POA: Diagnosis not present

## 2015-08-30 DIAGNOSIS — I10 Essential (primary) hypertension: Secondary | ICD-10-CM | POA: Insufficient documentation

## 2015-08-30 DIAGNOSIS — Z8673 Personal history of transient ischemic attack (TIA), and cerebral infarction without residual deficits: Secondary | ICD-10-CM | POA: Diagnosis not present

## 2015-08-30 DIAGNOSIS — I255 Ischemic cardiomyopathy: Secondary | ICD-10-CM | POA: Insufficient documentation

## 2015-08-30 DIAGNOSIS — Z7984 Long term (current) use of oral hypoglycemic drugs: Secondary | ICD-10-CM | POA: Diagnosis not present

## 2015-08-30 DIAGNOSIS — J449 Chronic obstructive pulmonary disease, unspecified: Secondary | ICD-10-CM | POA: Insufficient documentation

## 2015-08-30 DIAGNOSIS — D649 Anemia, unspecified: Secondary | ICD-10-CM | POA: Insufficient documentation

## 2015-08-30 DIAGNOSIS — I2510 Atherosclerotic heart disease of native coronary artery without angina pectoris: Secondary | ICD-10-CM | POA: Insufficient documentation

## 2015-08-30 DIAGNOSIS — C9311 Chronic myelomonocytic leukemia, in remission: Secondary | ICD-10-CM | POA: Diagnosis present

## 2015-08-30 DIAGNOSIS — Z79899 Other long term (current) drug therapy: Secondary | ICD-10-CM | POA: Diagnosis not present

## 2015-08-30 DIAGNOSIS — Z9889 Other specified postprocedural states: Secondary | ICD-10-CM | POA: Diagnosis not present

## 2015-08-30 DIAGNOSIS — I252 Old myocardial infarction: Secondary | ICD-10-CM | POA: Insufficient documentation

## 2015-08-30 DIAGNOSIS — K5790 Diverticulosis of intestine, part unspecified, without perforation or abscess without bleeding: Secondary | ICD-10-CM | POA: Insufficient documentation

## 2015-08-30 DIAGNOSIS — F17210 Nicotine dependence, cigarettes, uncomplicated: Secondary | ICD-10-CM | POA: Diagnosis not present

## 2015-08-30 LAB — DIFFERENTIAL
BASOS PCT: 1 %
Basophils Absolute: 0.1 10*3/uL (ref 0–0.1)
EOS ABS: 0.1 10*3/uL (ref 0–0.7)
EOS PCT: 1 %
LYMPHS PCT: 32 %
Lymphs Abs: 1.7 10*3/uL (ref 1.0–3.6)
Monocytes Absolute: 1.7 10*3/uL — ABNORMAL HIGH (ref 0.2–1.0)
Monocytes Relative: 34 %
NEUTROS ABS: 1.7 10*3/uL (ref 1.4–6.5)
Neutrophils Relative %: 32 %

## 2015-08-30 LAB — PROTIME-INR
INR: 1.1
PROTHROMBIN TIME: 14.4 s (ref 11.4–15.0)

## 2015-08-30 LAB — CBC
HEMATOCRIT: 35.5 % — AB (ref 40.0–52.0)
HEMOGLOBIN: 11.9 g/dL — AB (ref 13.0–18.0)
MCH: 32.1 pg (ref 26.0–34.0)
MCHC: 33.5 g/dL (ref 32.0–36.0)
MCV: 95.8 fL (ref 80.0–100.0)
Platelets: 168 10*3/uL (ref 150–440)
RBC: 3.7 MIL/uL — ABNORMAL LOW (ref 4.40–5.90)
RDW: 14.9 % — AB (ref 11.5–14.5)
WBC: 5.3 10*3/uL (ref 3.8–10.6)

## 2015-08-30 LAB — APTT: APTT: 35 s (ref 24–36)

## 2015-08-30 MED ORDER — FENTANYL CITRATE (PF) 100 MCG/2ML IJ SOLN
INTRAMUSCULAR | Status: AC | PRN
  Administered 2015-08-30: 50 ug via INTRAVENOUS

## 2015-08-30 MED ORDER — MIDAZOLAM HCL 5 MG/5ML IJ SOLN
INTRAMUSCULAR | Status: AC | PRN
  Administered 2015-08-30 (×2): 1 mg via INTRAVENOUS

## 2015-08-30 MED ORDER — SODIUM CHLORIDE 0.9 % IV SOLN
Freq: Once | INTRAVENOUS | Status: AC
  Administered 2015-08-30: 09:00:00 via INTRAVENOUS

## 2015-08-30 NOTE — Procedures (Signed)
Successful RT ILIAC BM ASP AND CORE BX NO COMP STABLE PATH PENDNIG FULL REPORT IN PACS

## 2015-08-30 NOTE — H&P (Signed)
Chief Complaint: Abnormal WBC ct, anemia, suspect leukemia Referring Physician(s): Finnegan,Timothy J  History of Present Illness: Tony Watson is a 65 y.o. male with abnl WBC ct and anemia.  Suspect leukemia.  No complaints today.  Plan for CT guided BM asp and core bx today.  Past Medical History  Diagnosis Date  . Ischemic cardiomyopathy     mild with EF 42% on myoviews 2008.  Marland Kitchen TIA (transient ischemic attack)     hx; now s/p PFO closure 2004 (in [place])  . DM2 (diabetes mellitus, type 2) (HCC)     A1c 6.0% 12/11  . Smoker   . Diverticulosis 2002  . GERD (gastroesophageal reflux disease)   . Ulcer   . Generalized headaches   . Medical non-compliance   . Nicotine dependence     chronic, active  . COPD (chronic obstructive pulmonary disease) ([place])   . Seasonal allergic rhinitis   . CAD (coronary artery disease)     inferior MI 11/99 (in [place]) with PCI to [facility]. last [place] 2004 [facility]): EF 50%, patent RCA stent. no obstructive disease. last myoview 2008: EF 42%, inferior infarct, no ischemia.   . Systolic heart failure secondary to coronary artery disease ([place]) 05/30/2012  . HTN (hypertension)   . Hyperlipidemia   . Diabetic autonomic neuropathy associated with type 2 diabetes mellitus ([place]) 03/27/2015  . Asthma   . Myocardial infarction [employer])     Past Surgical History  Procedure Laterality Date  . Open heart surgery  2004    PFO repair  . Cardiac catheterization  11/99    CI per PMH    Allergies: Review of patient's allergies indicates no known allergies.  Medications: Prior to Admission medications   Medication Sig Start Date End Date Taking? Authorizing Provider  carvedilol (COREG) 3.125 MG tablet Take 1 tablet (3.125 mg total) by mouth 2 (two) times daily. 03/27/15  Yes Owens Loffler, MD  chlorpheniramine-HYDROcodone (TUSSIONEX PENNKINETIC ER) 10-8 MG/5ML SUER Take 5 mLs by mouth every 12 (twelve) hours as needed for cough. 07/15/15  Yes Johnn Hai,  PA-C  gabapentin (NEURONTIN) 300 MG capsule Take 1 capsule (300 mg total) by mouth at bedtime. 03/27/15  Yes Spencer Copland, MD  ipratropium-albuterol (DUONEB) 0.5-2.5 (3) MG/3ML SOLN Take 3 mLs by nebulization every 4 (four) hours as needed. 07/15/15  Yes Johnn Hai, PA-C  metFORMIN (GLUCOPHAGE-XR) 500 MG 24 hr tablet Take 1 tablet (500 mg total) by mouth daily with breakfast. 03/27/15  Yes Owens Loffler, MD  ondansetron (ZOFRAN ODT) 4 MG disintegrating tablet Take 1 tablet (4 mg total) by mouth every 6 (six) hours as needed for nausea or vomiting. 07/22/15  Yes Owens Loffler, MD  predniSONE (DELTASONE) 20 MG tablet Take 3 tablets daily for 3 days, then 2 tablets daily for 2 days, then one tablet daily for 2 days. Patient not taking: Reported on 08/30/2015 07/19/15   Amy E Diona Browner, MD  tiZANidine (ZANAFLEX) 4 MG tablet Take 1 tablet (4 mg total) by mouth Nightly. Patient not taking: Reported on 08/30/2015 03/27/15   Owens Loffler, MD     Family History  Problem Relation Age of Onset  . Coronary artery disease      family hx  . Breast cancer      1st egree relative <50    Social History   Social History  . Marital Status: Married    Spouse Name: N/A  . Number of Children: N/A  . Years of  Education: N/A   Social History Main Topics  . Smoking status: Current Every Day Smoker -- 1.00 packs/day for 45 years    Types: Cigarettes  . Smokeless tobacco: Never Used     Comment: 1 ppd +40 years  . Alcohol Use: Yes     Comment: beer weekly  . Drug Use: No  . Sexual Activity: Not Asked   Other Topics Concern  . None   Social History Narrative   Married, 1 daughter. 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 Lives in [place], [place]    ECOG Status: 0 - Asymptomatic  Review of Systems: A 12 point ROS discussed and pertinent positives are indicated in the HPI above.  All other systems are negative.  Review of Systems  Vital Signs: Pulse 103  Temp(Src) 98.2 F  (36.8 C) (Oral)  Resp 18  Ht 5\' 11"  (1.803 m)  Wt 188 lb (85.276 kg)  BMI 26.23 kg/m2  SpO2 96%  Physical Exam  Constitutional: He appears well-developed and well-nourished. No distress.  Cardiovascular: Normal rate and regular rhythm.   No murmur heard. Pulmonary/Chest: Effort normal. He has wheezes. He exhibits no tenderness.  Abdominal: Soft. Bowel sounds are normal. He exhibits no distension. There is no rebound and no guarding.  Skin: He is not diaphoretic.  Psychiatric: He has a normal mood and affect. His behavior is normal.    Mallampati Score:   1  Imaging: No results found.  Labs:  CBC:  Recent Labs  04/24/15 0815 07/15/15 1333 07/19/15 1350 08/30/15 0816  WBC 4.1 24.9* 12.1* 5.3  HGB 13.5 12.8* 11.9* 11.9*  HCT 39.3* 37.9* 35.3* 35.5*  PLT 129* 137* 148.0* 168    COAGS:  Recent Labs  08/30/15 0816  INR 1.10  APTT 35    BMP:  Recent Labs  03/27/15 0958 07/15/15 1333  NA 138 131*  K 4.0 3.7  CL 103 99*  CO2 26 22  GLUCOSE 131* 118*  BUN 14 14  CALCIUM 9.3 9.1  CREATININE 0.81 0.87  GFRNONAA  --  >60  GFRAA  --  >60    LIVER FUNCTION TESTS:  Recent Labs  03/27/15 0958 07/15/15 1333  BILITOT 0.5 0.5  AST 37 30  ALT 34 26  ALKPHOS 30* 31*  PROT 7.5 8.3*  ALBUMIN 4.5 4.1    TUMOR MARKERS: No results for input(s): AFPTM, CEA, CA199, CHROMGRNA in the last 8760 hours.  Assessment and Plan: Abnormal WBC ct, concern for leukemia.  Plan for BM asp and core bx today.  Consent obtained from pt after discussion of the risk including bleeding, pain, and adjacent soft tissue  or organ injury.  Thank you for this interesting consult.  I greatly enjoyed meeting Tony Watson and look forward to participating in their care.  A copy of this report was sent to the requesting provider on this date.  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

## 2015-08-30 NOTE — Discharge Instructions (Signed)
Bone Marrow Aspiration and Bone Marrow Biopsy, Care After °Refer to this sheet in the next few weeks. These instructions provide you with information about caring for yourself after your procedure. Your health care provider may also give you more specific instructions. Your treatment has been planned according to current medical practices, but problems sometimes occur. Call your health care provider if you have any problems or questions after your procedure. °WHAT TO EXPECT AFTER THE PROCEDURE °After your procedure, it is common to have: °· Soreness or tenderness around the puncture site. °· Bruising. °HOME CARE INSTRUCTIONS °· Take medicines only as directed by your health care provider. °· Follow your health care provider's instructions about: °¨ Puncture site care. °¨ Bandage (dressing) changes and removal. °· Bathe and shower as directed by your health care provider. °· Check your puncture site every day for signs of infection. Watch for: °¨ Redness, swelling, or pain. °¨ Fluid, blood, or pus. °· Return to your normal activities as directed by your health care provider. °· Keep all follow-up visits as directed by your health care provider. This is important. °SEEK MEDICAL CARE IF: °· You have a fever. °· You have uncontrollable bleeding. °· You have redness, swelling, or pain at the site of your puncture. °· You have fluid, blood, or pus coming from your puncture site. °  °This information is not intended to replace advice given to you by your health care provider. Make sure you discuss any questions you have with your health care provider. °  °Document Released: 02/13/2005 Document Revised: 12/11/2014 Document Reviewed: 07/18/2014 °Elsevier Interactive Patient Education ©2016 [employer] ° °

## 2015-09-03 ENCOUNTER — Encounter: Payer: Self-pay | Admitting: Family Medicine

## 2015-09-03 ENCOUNTER — Ambulatory Visit (INDEPENDENT_AMBULATORY_CARE_PROVIDER_SITE_OTHER): Payer: 59 | Admitting: Family Medicine

## 2015-09-03 VITALS — BP 130/66 | HR 100 | Temp 98.1°F | Ht 71.25 in | Wt 196.0 lb

## 2015-09-03 DIAGNOSIS — J441 Chronic obstructive pulmonary disease with (acute) exacerbation: Secondary | ICD-10-CM | POA: Diagnosis not present

## 2015-09-03 MED ORDER — PREDNISONE 20 MG PO TABS: ORAL_TABLET | ORAL | Status: DC

## 2015-09-03 MED ORDER — HYDROCOD POLST-CPM POLST ER 10-8 MG/5ML PO SUER: 5.0000 mL | Freq: Every evening | ORAL | Status: DC | PRN

## 2015-09-03 MED ORDER — AZITHROMYCIN 250 MG PO TABS: ORAL_TABLET | ORAL | Status: DC

## 2015-09-03 NOTE — Progress Notes (Signed)
Pre visit review using our clinic review tool, if applicable. No additional management support is needed unless otherwise documented below in the visit note. 

## 2015-09-03 NOTE — Patient Instructions (Signed)
Start prednisone taper. Complete antibiotics.  Use nebulizer as needed.  Go to ER if severe shortness of breath, call if fever on antibiotics or not tolerating them.

## 2015-09-03 NOTE — Assessment & Plan Note (Signed)
Not clearly recurrent pneumonia since was well for 2 weeks, may be new infection given immunocompromised status.  Recent CBC diff looked pretty good. Had recent bone marrow for possible recurrence CML.  Will treat with steroids, cough suppressant and cover with antibiotics given immunocompromised.  Will have him follow up with PCP for resolution given complicated case and also need for setting up repeat CT to eval nodules.

## 2015-09-03 NOTE — Progress Notes (Signed)
Subjective:    Patient ID: Tony Watson, male    DOB: 1951-04-06, 65 y.o.   MRN: [id number]  Cough This is a recurrent problem. The current episode started 1 to 4 weeks ago (Symptoms of PNA resolved for 1-2 weeks, cough resolved except for smokers cough, then  nasal congestion restarted in last 2 weeks.). The problem has been rapidly worsening. The problem occurs constantly. The cough is productive of sputum (clear and sticky). Associated symptoms include nasal congestion, shortness of breath and wheezing. Pertinent negatives include no chills, ear pain, fever, myalgias or sore throat. Associated symptoms comments: No increase in shortness of breath  some increase in wheezing lately. The symptoms are aggravated by lying down. Risk factors for lung disease include smoking/tobacco exposure. He has tried a beta-agonist inhaler and prescription cough suppressant (nebs, cough drops) for the symptoms. The treatment provided moderate (neb helps some, others help none) relief. His past medical history is significant for COPD and pneumonia. There is no history of asthma, bronchiectasis, bronchitis or environmental allergies.     Social History /Family History/Past Medical History reviewed and updated if needed. Treated for RUL PNA in December  With levaquin  Hx of COPD CT scan of the chest per radiologist showed focal right upper lobe consolidation consistent with pneumonia. Trace right pleural effusion. Borderline pericardial and adenopathy possibly reactive but nonspecific. Also solid nodules in both lower lungs probably infectious/inflammatory. follow-up by chest CT without contrast is recommended in 3 months to confirm appropriate resolution and exclude persistence/neoplasm   He has bone marrow biopsy recently for low white blood cells. 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 1/20 wbc 5.3, neutrophil and lymph nml.       Review of Systems  Constitutional: Negative for fever and  chills.  HENT: Negative for ear pain and sore throat.   Respiratory: Positive for cough, shortness of breath and wheezing.   Musculoskeletal: Negative for myalgias.  Allergic/Immunologic: Negative for environmental allergies.       Objective:   Physical Exam  Constitutional: Vital signs are normal. He appears well-developed and well-nourished.  Non-toxic appearance. He does not appear ill. No distress.  HENT:  Head: Normocephalic and atraumatic.  Right Ear: Hearing, tympanic membrane, external ear and ear canal normal. No tenderness. No foreign bodies. Tympanic membrane is not retracted and not bulging.  Left Ear: Hearing, tympanic membrane, external ear and ear canal normal. No tenderness. No foreign bodies. Tympanic membrane is not retracted and not bulging.  Nose: Nose normal. No mucosal edema or rhinorrhea. Right sinus exhibits no maxillary sinus tenderness and no frontal sinus tenderness. Left sinus exhibits no maxillary sinus tenderness and no frontal sinus tenderness.  Mouth/Throat: Uvula is midline, oropharynx is clear and moist and mucous membranes are normal. Normal dentition. No dental caries. No oropharyngeal exudate or tonsillar abscesses.  Eyes: Conjunctivae, EOM and lids are normal. Pupils are equal, round, and reactive to light. Lids are everted and swept, no foreign bodies found.  Neck: Trachea normal, normal range of motion and phonation normal. Neck supple. Carotid bruit is not present. No thyroid mass and no thyromegaly present.  Cardiovascular: Normal rate, regular rhythm, S1 normal, S2 normal, normal heart sounds, intact distal pulses and normal pulses.  Exam reveals no gallop.   No murmur heard. Pulmonary/Chest: Effort normal. No respiratory distress. He has decreased breath sounds. He has wheezes. He has no rhonchi. He has no rales.  No focal lung finding suggesting pneumonia  Abdominal: Soft. Normal  appearance and bowel sounds are normal. There is no  hepatosplenomegaly. There is no tenderness. There is no rebound, no guarding and no CVA tenderness. No hernia.  Neurological: He is alert. He has normal reflexes.  Skin: Skin is warm, dry and intact. No rash noted.  Psychiatric: He has a normal mood and affect. His speech is normal and behavior is normal. Judgment normal.          Assessment & Plan:

## 2015-09-04 ENCOUNTER — Other Ambulatory Visit: Payer: Self-pay | Admitting: Family Medicine

## 2015-09-11 ENCOUNTER — Ambulatory Visit: Payer: 59 | Admitting: Family Medicine

## 2015-10-02 ENCOUNTER — Other Ambulatory Visit: Payer: Self-pay | Admitting: Family Medicine

## 2015-10-21 ENCOUNTER — Ambulatory Visit
Admission: RE | Admit: 2015-10-21 | Discharge: 2015-10-21 | Disposition: A | Discharge status: Home / Self Care | Payer: 59 | Source: Ambulatory Visit | Attending: Oncology | Admitting: Oncology

## 2015-10-21 ENCOUNTER — Inpatient Hospital Stay: Payer: 59 | Attending: Oncology

## 2015-10-21 DIAGNOSIS — I255 Ischemic cardiomyopathy: Secondary | ICD-10-CM | POA: Insufficient documentation

## 2015-10-21 DIAGNOSIS — E785 Hyperlipidemia, unspecified: Secondary | ICD-10-CM | POA: Diagnosis not present

## 2015-10-21 DIAGNOSIS — I10 Essential (primary) hypertension: Secondary | ICD-10-CM | POA: Diagnosis not present

## 2015-10-21 DIAGNOSIS — D696 Thrombocytopenia, unspecified: Secondary | ICD-10-CM | POA: Diagnosis not present

## 2015-10-21 DIAGNOSIS — Z79899 Other long term (current) drug therapy: Secondary | ICD-10-CM | POA: Insufficient documentation

## 2015-10-21 DIAGNOSIS — Z8673 Personal history of transient ischemic attack (TIA), and cerebral infarction without residual deficits: Secondary | ICD-10-CM | POA: Diagnosis not present

## 2015-10-21 DIAGNOSIS — R911 Solitary pulmonary nodule: Secondary | ICD-10-CM | POA: Diagnosis not present

## 2015-10-21 DIAGNOSIS — J449 Chronic obstructive pulmonary disease, unspecified: Secondary | ICD-10-CM | POA: Diagnosis not present

## 2015-10-21 DIAGNOSIS — C9310 Chronic myelomonocytic leukemia not having achieved remission: Secondary | ICD-10-CM | POA: Diagnosis present

## 2015-10-21 DIAGNOSIS — I252 Old myocardial infarction: Secondary | ICD-10-CM | POA: Diagnosis not present

## 2015-10-21 DIAGNOSIS — Z7984 Long term (current) use of oral hypoglycemic drugs: Secondary | ICD-10-CM | POA: Insufficient documentation

## 2015-10-21 DIAGNOSIS — J45909 Unspecified asthma, uncomplicated: Secondary | ICD-10-CM | POA: Diagnosis not present

## 2015-10-21 DIAGNOSIS — F17210 Nicotine dependence, cigarettes, uncomplicated: Secondary | ICD-10-CM | POA: Insufficient documentation

## 2015-10-21 DIAGNOSIS — E119 Type 2 diabetes mellitus without complications: Secondary | ICD-10-CM | POA: Diagnosis not present

## 2015-10-21 DIAGNOSIS — C9311 Chronic myelomonocytic leukemia, in remission: Secondary | ICD-10-CM

## 2015-10-21 DIAGNOSIS — Z955 Presence of coronary angioplasty implant and graft: Secondary | ICD-10-CM | POA: Insufficient documentation

## 2015-10-21 DIAGNOSIS — I2510 Atherosclerotic heart disease of native coronary artery without angina pectoris: Secondary | ICD-10-CM | POA: Insufficient documentation

## 2015-10-21 DIAGNOSIS — K219 Gastro-esophageal reflux disease without esophagitis: Secondary | ICD-10-CM | POA: Diagnosis not present

## 2015-10-21 LAB — CBC WITH DIFFERENTIAL/PLATELET
BASOS ABS: 0 10*3/uL (ref 0–0.1)
BASOS PCT: 1 %
EOS ABS: 0 10*3/uL (ref 0–0.7)
EOS PCT: 0 %
HCT: 38 % — ABNORMAL LOW (ref 40.0–52.0)
Hemoglobin: 13.4 g/dL (ref 13.0–18.0)
Lymphocytes Relative: 41 %
Lymphs Abs: 1.8 10*3/uL (ref 1.0–3.6)
MCH: 33.3 pg (ref 26.0–34.0)
MCHC: 35.2 g/dL (ref 32.0–36.0)
MCV: 94.6 fL (ref 80.0–100.0)
MONO ABS: 1.7 10*3/uL — AB (ref 0.2–1.0)
Monocytes Relative: 39 %
Neutro Abs: 0.8 10*3/uL — ABNORMAL LOW (ref 1.4–6.5)
Neutrophils Relative %: 19 %
PLATELETS: 131 10*3/uL — AB (ref 150–440)
RBC: 4.02 MIL/uL — ABNORMAL LOW (ref 4.40–5.90)
RDW: 14.8 % — AB (ref 11.5–14.5)
WBC: 4.4 10*3/uL (ref 3.8–10.6)

## 2015-10-21 MED ORDER — IOHEXOL 300 MG/ML  SOLN
75.0000 mL | Freq: Once | INTRAMUSCULAR | Status: AC | PRN
  Administered 2015-10-21: 75 mL via INTRAVENOUS

## 2015-10-22 ENCOUNTER — Ambulatory Visit: Payer: 59 | Admitting: Oncology

## 2015-10-23 ENCOUNTER — Ambulatory Visit: Payer: 59 | Admitting: Oncology

## 2015-10-28 ENCOUNTER — Inpatient Hospital Stay (HOSPITAL_BASED_OUTPATIENT_CLINIC_OR_DEPARTMENT_OTHER): Payer: 59 | Admitting: Oncology

## 2015-10-28 VITALS — BP 165/82 | HR 101 | Temp 98.3°F | Resp 18 | Wt 201.1 lb

## 2015-10-28 DIAGNOSIS — Z79899 Other long term (current) drug therapy: Secondary | ICD-10-CM | POA: Diagnosis not present

## 2015-10-28 DIAGNOSIS — C9310 Chronic myelomonocytic leukemia not having achieved remission: Secondary | ICD-10-CM

## 2015-10-28 DIAGNOSIS — F17210 Nicotine dependence, cigarettes, uncomplicated: Secondary | ICD-10-CM

## 2015-10-28 DIAGNOSIS — C9311 Chronic myelomonocytic leukemia, in remission: Secondary | ICD-10-CM

## 2015-10-28 DIAGNOSIS — D696 Thrombocytopenia, unspecified: Secondary | ICD-10-CM | POA: Diagnosis not present

## 2015-10-28 DIAGNOSIS — R918 Other nonspecific abnormal finding of lung field: Secondary | ICD-10-CM

## 2015-10-28 LAB — COMP PANEL: LEUKEMIA/LYMPHOMA

## 2015-10-28 NOTE — Progress Notes (Signed)
Patient here for CT results.  

## 2015-11-06 ENCOUNTER — Other Ambulatory Visit: Payer: Self-pay | Admitting: Family Medicine

## 2015-11-10 NOTE — Progress Notes (Signed)
[facility]  [phone] [phone]) [phone]  ID: ELIAS DENNINGTON OB: 1951/04/23  MR#: [id number]  MLY#:[id number]  Patient Care Team: Owens Loffler, MD as PCP - General  CHIEF COMPLAINT:  Chief Complaint  Patient presents with  . Chronic myelomomocytic leukemia    INTERVAL HISTORY: Patient returns to clinic today for repeat laboratory work and discussion of his bone marrow biopsy results. He currently feels well and is asymptomatic. He denies any fevers, chills, or night sweats. He has no neurologic complaints. He has a good appetite and denies weight loss. He has no chest pain or shortness of breath. He denies any nausea, vomiting, constipation, or diarrhea. He has no urinary complaints. Patient offers no specific complaints today.  REVIEW OF SYSTEMS:   Review of Systems  Constitutional: Negative for fever, weight loss, malaise/fatigue and diaphoresis.  Respiratory: Negative for cough and shortness of breath.   Cardiovascular: Negative.  Negative for chest pain.  Gastrointestinal: Negative.   Genitourinary: Negative.   Musculoskeletal: Negative.   Neurological: Negative.  Negative for weakness.  Endo/Heme/Allergies: Does not bruise/bleed easily.    As per HPI. Otherwise, a complete review of systems is negatve.  PAST MEDICAL HISTORY: Past Medical History  Diagnosis Date  . Ischemic cardiomyopathy     mild with EF 42% on myoviews 2008.  Marland Kitchen TIA (transient ischemic attack)     hx; now s/p PFO closure 2004 (in [place])  . DM2 (diabetes mellitus, type 2) (HCC)     A1c 6.0% 12/11  . Smoker   . Diverticulosis 2002  . GERD (gastroesophageal reflux disease)   . Ulcer   . Generalized headaches   . Medical non-compliance   . Nicotine dependence     chronic, active  . COPD (chronic obstructive pulmonary disease) ([place])   . Seasonal allergic rhinitis   . CAD (coronary artery disease)     inferior MI 11/99 (in [place]) with PCI to [facility]. last [place] 2004  [facility]): EF 50%, patent RCA stent. no obstructive disease. last myoview 2008: EF 42%, inferior infarct, no ischemia.   . Systolic heart failure secondary to coronary artery disease ([place]) 05/30/2012  . HTN (hypertension)   . Hyperlipidemia   . Diabetic autonomic neuropathy associated with type 2 diabetes mellitus ([place]) 03/27/2015  . Asthma   . Myocardial infarction [facility])     PAST SURGICAL HISTORY: Past Surgical History  Procedure Laterality Date  . Open heart surgery  2004    PFO repair  . Cardiac catheterization  11/99    CI per PMH    FAMILY HISTORY Family History  Problem Relation Age of Onset  . Coronary artery disease      family hx  . Breast cancer      1st egree relative <50       ADVANCED DIRECTIVES:    HEALTH MAINTENANCE: Social History  Substance Use Topics  . Smoking status: Current Every Day Smoker -- 1.00 packs/day for 45 years    Types: Cigarettes  . Smokeless tobacco: Never Used     Comment: 1 ppd +40 years  . Alcohol Use: 0.0 oz/week    0 Standard drinks or equivalent per week     Comment: beer weekly     Colonoscopy:  PAP:  Bone density:  Lipid panel:  No Known Allergies  Current Outpatient Prescriptions  Medication Sig Dispense Refill  . carvedilol (COREG) 3.125 MG tablet Take 1 tablet (3.125 mg total) by mouth 2 (two) times daily. 180 tablet 3  .  chlorpheniramine-HYDROcodone (TUSSIONEX PENNKINETIC ER) 10-8 MG/5ML SUER Take 5 mLs by mouth at bedtime as needed for cough. 140 mL 0  . gabapentin (NEURONTIN) 300 MG capsule Take 1 capsule (300 mg total) by mouth at bedtime. 90 capsule 3  . metFORMIN (GLUCOPHAGE-XR) 500 MG 24 hr tablet TAKE 1 TABLET BY MOUTH ONCE A DAY 90 tablet 0  . tiZANidine (ZANAFLEX) 4 MG tablet Take 1 tablet (4 mg total) by mouth Nightly. 90 tablet 3  . ipratropium-albuterol (DUONEB) 0.5-2.5 (3) MG/3ML SOLN USE 1 VIAL VIA NEBULIZER EVERY 4 HOURS AS NEEDED 540 mL 2  . predniSONE (DELTASONE) 20 MG tablet Take 3 tablets daily for  3 days, then 2 tablets daily for 2 days, then one tablet daily for 2 days. (Patient not taking: Reported on 10/28/2015) 15 tablet 0   No current facility-administered medications for this visit.    OBJECTIVE: Filed Vitals:   10/28/15 1424  BP: 165/82  Pulse: 101  Temp: 98.3 F (36.8 C)  Resp: 18     Body mass index is 27.84 kg/(m^2).    ECOG FS:0 - Asymptomatic  General: Well-developed, well-nourished, no acute distress. Eyes: Pink conjunctiva, anicteric sclera. Lungs: Clear to auscultation bilaterally. Heart: Regular rate and rhythm. No rubs, murmurs, or gallops. Abdomen: Soft, nontender, nondistended. No organomegaly noted, normoactive bowel sounds. Musculoskeletal: No edema, cyanosis, or clubbing. Neuro: Alert, answering all questions appropriately. Cranial nerves grossly intact. Skin: No rashes or petechiae noted. Psych: Normal affect.   LAB RESULTS:  Lab Results  Component Value Date   NA 131* 07/15/2015   K 3.7 07/15/2015   CL 99* 07/15/2015   CO2 22 07/15/2015   GLUCOSE 118* 07/15/2015   BUN 14 07/15/2015   CREATININE 0.87 07/15/2015   CALCIUM 9.1 07/15/2015   PROT 8.3* 07/15/2015   ALBUMIN 4.1 07/15/2015   AST 30 07/15/2015   ALT 26 07/15/2015   ALKPHOS 31* 07/15/2015   BILITOT 0.5 07/15/2015   GFRNONAA >60 07/15/2015   GFRAA >60 07/15/2015    Lab Results  Component Value Date   WBC 4.4 10/21/2015   NEUTROABS 0.8* 10/21/2015   HGB 13.4 10/21/2015   HCT 38.0* 10/21/2015   MCV 94.6 10/21/2015   PLT 131* 10/21/2015     STUDIES: Ct Chest W Contrast  10/21/2015  CLINICAL DATA:  Pneumonia, pulmonary nodules. Chronic cough and shortness of breath. EXAM: CT CHEST WITH CONTRAST TECHNIQUE: Multidetector CT imaging of the chest was performed during intravenous contrast administration. CONTRAST:  37m OMNIPAQUE IOHEXOL 300 MG/ML  SOLN COMPARISON:  07/15/2015. FINDINGS: Mediastinum/Nodes: Mediastinal lymph nodes are not enlarged by CT size criteria. No hilar or  axillary adenopathy. Extensive 3 vessel coronary artery calcification. Heart size normal. No pericardial effusion. Lungs/Pleura: Minimal residual scarring in the peripheral right upper lobe at the site of previously seen consolidation on 07/15/2015. Numerous upper and mid lung zone predominant peribronchovascular ground-glass nodules, progressive. Previously measured 5 mm left lower lobe nodule is no longer readily identified, nor is a 4 mm right lower lobe nodule, previously measured. No pleural fluid. Airway is unremarkable. Upper abdomen: Visualized portions of the liver, gallbladder, adrenal glands, kidneys, spleen, pancreas, stomach and bowel are grossly unremarkable. Scattered lymph nodes are not enlarged by CT size criteria. Musculoskeletal: No worrisome lytic or sclerotic lesions. Degenerative changes are seen in the spine. IMPRESSION: 1. Resolved right upper lobe pneumonia. 2. Progressive upper/mid lung zone predominant peribronchovascular ground-glass nodularity. Findings and time course favor an atypical infectious process. Follow-up CT chest without contrast is  recommended in 3 months to ensure resolution. 3. Previously measured lower lower nodules are no longer visualized. 4. Extensive 3 vessel coronary artery calcification. Electronically Signed   By: Lorin Picket M.D.   On: 10/21/2015 11:02    ASSESSMENT: Bone marrow biopsy proven CMML-1.  PLAN:    1. CMML: Bone marrow biopsy completed on August 30, 2015 confirmed the diagnosis.  Patient's absolute monocytosis has been greater than 1.0 and persistent since at least July 2014. The remainder of his blood work including BCR-ABL and ANA are negative or within normal limits. Previously, hepatitis C and HIV were also negative.  He will likely need treatment with azacitidine or decitabine at some point, but not at this moment. He has no evidence of end organ involvement other than a mild thrombocytopenia which is also stable. Return to clinic in 3  months for laboratory work and then in 6 months for laboratory work and further evaluation. 2. Thrombocytopenia: Likely secondary to CMML. Bone marrow biopsy as above. 3. Pulmonary nodule: Incidental finding upon diagnosis of pneumonia. CT scan results reviewed independently and reported as above with resolution of nodule.  Approximately 30 minutes was spent in discussion of which greater than 50% was consultation.  Patient expressed understanding and was in agreement with this plan. He also understands that He can call clinic at any time with any questions, concerns, or complaints.     Lloyd Huger, MD   11/10/2015 11:29 AM

## 2016-01-20 ENCOUNTER — Other Ambulatory Visit: Payer: Self-pay | Admitting: Family Medicine

## 2016-01-27 ENCOUNTER — Ambulatory Visit (INDEPENDENT_AMBULATORY_CARE_PROVIDER_SITE_OTHER): Payer: 59 | Admitting: Family Medicine

## 2016-01-27 ENCOUNTER — Encounter: Payer: Self-pay | Admitting: Family Medicine

## 2016-01-27 VITALS — BP 110/70 | HR 101 | Temp 98.6°F | Ht 71.25 in | Wt 200.5 lb

## 2016-01-27 DIAGNOSIS — J029 Acute pharyngitis, unspecified: Secondary | ICD-10-CM | POA: Diagnosis not present

## 2016-01-27 DIAGNOSIS — C9310 Chronic myelomonocytic leukemia not having achieved remission: Secondary | ICD-10-CM | POA: Diagnosis not present

## 2016-01-27 LAB — POCT RAPID STREP A (OFFICE): Rapid Strep A Screen: NEGATIVE

## 2016-01-27 NOTE — Progress Notes (Signed)
Dr. Frederico Hamman T. Gerardo Caiazzo, MD, [facility] [place], [address] Phone: [phone] Fax: [phone]  01/27/2016  Patient: Tony Watson, MRN: [id number], DOB: 06/30/1951, 65 y.o.  Primary Physician:  Owens Loffler, MD   Chief Complaint  Patient presents with  . Sore Throat    x 8 to 10 days   Subjective:   This 65 y.o. male patient presents with sore throat for 10 days. Subjective fevers, achiness, headache. Some nausea. No significant URI sx. No significant cough.  Week to 10 days ago, then throat started to hurt a lot, then hurt again. Swallowing will hurt a lot. Feeling really exhausted. Slept in a chair all weekend.   The PMH, PSH, Social History, Family History, Medications, and allergies have been reviewed in [facility], and have been updated if relevant.   Patient Active Problem List   Diagnosis Date Noted  . Right upper lobe pneumonia 07/19/2015  . COPD exacerbation ([place]) 07/19/2015  . Pulmonary nodules/lesions, multiple 07/19/2015  . Monocytosis 07/19/2015  . Diabetic autonomic neuropathy associated with type 2 diabetes mellitus ([place]) 03/27/2015  . History of inferior MI (myocardial infarction) 10/14/2013  . CAD (coronary artery disease)   . HTN (hypertension)   . History of noncompliance with medical treatment 05/30/2012  . Systolic heart failure secondary to coronary artery disease ([place]) 05/30/2012  . COPD, severe 03/23/2011  . ALLERGIC RHINITIS CAUSE UNSPECIFIED 07/31/2010  . Type 2 diabetes mellitus with vascular disease ([place]) 01/02/2010  . TOBACCO USE 04/17/2009  . PARESTHESIA 04/17/2009  . TRANSIENT ISCHEMIC ATTACKS, HX OF 04/17/2009  . Hyperlipidemia 12/19/2008  . HYPERTENSION, UNSPECIFIED 12/19/2008    Past Medical History  Diagnosis Date  . Ischemic cardiomyopathy     mild with EF 42% on myoviews 2008.  Marland Kitchen TIA (transient ischemic attack)     hx; now s/p PFO closure 2004 (in [place])  . DM2  (diabetes mellitus, type 2) (HCC)     A1c 6.0% 12/11  . Smoker   . Diverticulosis 2002  . GERD (gastroesophageal reflux disease)   . Ulcer   . Generalized headaches   . Medical non-compliance   . Nicotine dependence     chronic, active  . COPD (chronic obstructive pulmonary disease) ([place])   . Seasonal allergic rhinitis   . CAD (coronary artery disease)     inferior MI 11/99 (in [place]) with PCI to [facility]. last [place] 2004 [facility]): EF 50%, patent RCA stent. no obstructive disease. last myoview 2008: EF 42%, inferior infarct, no ischemia.   . Systolic heart failure secondary to coronary artery disease ([place]) 05/30/2012  . HTN (hypertension)   . Hyperlipidemia   . Diabetic autonomic neuropathy associated with type 2 diabetes mellitus ([place]) 03/27/2015  . Asthma   . Myocardial infarction [facility])     Past Surgical History  Procedure Laterality Date  . Open heart surgery  2004    PFO repair  . Cardiac catheterization  11/99    CI per PMH    Social History   Social History  . Marital Status: Married    Spouse Name: N/A  . Number of Children: N/A  . Years of Education: N/A   Occupational History  . Not on file.   Social History Main Topics  . Smoking status: Current Every Day Smoker -- 1.00 packs/day for 45 years    Types: Cigarettes  . Smokeless tobacco: Never Used     Comment: 1 ppd +40 years  .  Alcohol Use: 0.0 oz/week    0 Standard drinks or equivalent per week     Comment: beer weekly  . Drug Use: No  . Sexual Activity: Not on file   Other Topics Concern  . Not on file   Social History Narrative   Married, 1 daughter. 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 Lives in [place], [place]    Family History  Problem Relation Age of Onset  . Coronary artery disease      family hx  . Breast cancer      1st egree relative <50    No Known Allergies  Medication list reviewed and updated in full in [place].  GEN: Acute illness details above GI:  Tolerating PO intake GU: maintaining adequate hydration and urination Pulm: No SOB Interactive and getting along well at home. Otherwise, ROS is as per the HPI.  Objective:   Blood pressure 110/70, pulse 101, temperature 98.6 F (37 C), temperature source Oral, height 5' 11.25" (1.81 m), weight 200 lb 8 oz (90.946 kg).  Gen: WDWN, NAD; A & O x3, cooperative. Pleasant.Globally Non-toxic HEENT: Normocephalic and atraumatic. Throat: clear R TM clear, L TM - good landmarks, No fluid present. rhinnorhea. No frontal or maxillary sinus T. MMM NECK: Anterior cervical  LAD is absent CV: RRR, No M/G/R, cap refill <2 sec PULM: Breathing comfortably in no respiratory distress. no wheezing, crackles, rhonchi EXT: No c/c/e PSYCH: Friendly, good eye contact MSK: Nml gait   Results for orders placed or performed in visit on 01/27/16  POCT rapid strep A  Result Value Ref Range   Rapid Strep A Screen Negative Negative    Assessment & Plan:   Viral pharyngitis  Sore throat - Plan: POCT rapid strep A  Supportive care  Rec inhaler but with financial limitation duoneb at least BID - QID preferable  Follow-up: No Follow-up on file.  Orders Placed This Encounter  Procedures  . POCT rapid strep A    Signed,  Laurann Mcmorris T. Tejuan Gholson, MD   Patient's Medications  New Prescriptions   No medications on file  Previous Medications   CARVEDILOL (COREG) 3.125 MG TABLET    Take 1 tablet (3.125 mg total) by mouth 2 (two) times daily.   GABAPENTIN (NEURONTIN) 300 MG CAPSULE    Take 1 capsule (300 mg total) by mouth at bedtime.   IPRATROPIUM-ALBUTEROL (DUONEB) 0.5-2.5 (3) MG/3ML SOLN    USE 1 VIAL VIA NEBULIZER EVERY 4 HOURS AS NEEDED   METFORMIN (GLUCOPHAGE-XR) 500 MG 24 HR TABLET    TAKE 1 TABLET BY MOUTH ONCE A DAY   TIZANIDINE (ZANAFLEX) 4 MG TABLET    Take 1 tablet (4 mg total) by mouth Nightly.  Modified Medications   No medications on file  Discontinued Medications    CHLORPHENIRAMINE-HYDROCODONE (TUSSIONEX PENNKINETIC ER) 10-8 MG/5ML SUER    Take 5 mLs by mouth at bedtime as needed for cough.   PREDNISONE (DELTASONE) 20 MG TABLET    Take 3 tablets daily for 3 days, then 2 tablets daily for 2 days, then one tablet daily for 2 days.

## 2016-01-27 NOTE — Progress Notes (Signed)
Pre visit review using our clinic review tool, if applicable. No additional management support is needed unless otherwise documented below in the visit note. 

## 2016-01-28 ENCOUNTER — Inpatient Hospital Stay: Payer: 59 | Attending: Oncology

## 2016-01-28 DIAGNOSIS — F17210 Nicotine dependence, cigarettes, uncomplicated: Secondary | ICD-10-CM | POA: Insufficient documentation

## 2016-01-28 DIAGNOSIS — D696 Thrombocytopenia, unspecified: Secondary | ICD-10-CM | POA: Insufficient documentation

## 2016-01-28 DIAGNOSIS — C9310 Chronic myelomonocytic leukemia not having achieved remission: Secondary | ICD-10-CM | POA: Diagnosis not present

## 2016-01-28 DIAGNOSIS — C9311 Chronic myelomonocytic leukemia, in remission: Secondary | ICD-10-CM

## 2016-01-28 DIAGNOSIS — Z79899 Other long term (current) drug therapy: Secondary | ICD-10-CM | POA: Insufficient documentation

## 2016-01-28 LAB — CBC WITH DIFFERENTIAL/PLATELET
Band Neutrophils: 7 %
Basophils Absolute: 0.1 10*3/uL (ref 0–0.1)
Basophils Relative: 1 %
EOS ABS: 0.2 10*3/uL (ref 0–0.7)
Eosinophils Relative: 2 %
HCT: 38.4 % — ABNORMAL LOW (ref 40.0–52.0)
Hemoglobin: 13.5 g/dL (ref 13.0–18.0)
LYMPHS ABS: 2.3 10*3/uL (ref 1.0–3.6)
Lymphocytes Relative: 27 %
MCH: 33.3 pg (ref 26.0–34.0)
MCHC: 35.3 g/dL (ref 32.0–36.0)
MCV: 94.2 fL (ref 80.0–100.0)
MONO ABS: 2.2 10*3/uL — AB (ref 0.2–1.0)
Monocytes Relative: 26 %
NEUTROS ABS: 3.7 10*3/uL (ref 1.4–6.5)
NEUTROS PCT: 37 %
PLATELETS: 137 10*3/uL — AB (ref 150–440)
RBC: 4.07 MIL/uL — AB (ref 4.40–5.90)
RDW: 15.6 % — AB (ref 11.5–14.5)
WBC: 8.5 10*3/uL (ref 3.8–10.6)

## 2016-02-27 ENCOUNTER — Encounter: Payer: Self-pay | Admitting: Family

## 2016-02-27 ENCOUNTER — Ambulatory Visit (INDEPENDENT_AMBULATORY_CARE_PROVIDER_SITE_OTHER): Payer: 59 | Admitting: Family

## 2016-02-27 VITALS — BP 148/64 | HR 105 | Temp 98.6°F | Wt 198.5 lb

## 2016-02-27 DIAGNOSIS — R591 Generalized enlarged lymph nodes: Secondary | ICD-10-CM | POA: Diagnosis not present

## 2016-02-27 DIAGNOSIS — R599 Enlarged lymph nodes, unspecified: Secondary | ICD-10-CM

## 2016-02-27 DIAGNOSIS — H10023 Other mucopurulent conjunctivitis, bilateral: Secondary | ICD-10-CM

## 2016-02-27 DIAGNOSIS — H1033 Unspecified acute conjunctivitis, bilateral: Secondary | ICD-10-CM

## 2016-02-27 NOTE — Progress Notes (Signed)
Pre visit review using our clinic review tool, if applicable. No additional management support is needed unless otherwise documented below in the visit note. 

## 2016-02-27 NOTE — Patient Instructions (Signed)
Please watch neck lymph nodes and ensure that they resolve when infection; if not, please come for evaluation.  [place] [place] [address] [place]  If there is no improvement in your symptoms, or if there is any worsening of symptoms, or if you have any additional concerns, please return for re-evaluation; or, if we are closed, consider going to the Emergency Room for evaluation if symptoms urgent.

## 2016-02-27 NOTE — Progress Notes (Signed)
Subjective:    Patient ID: Tony Watson, male    DOB: 1950/08/22, 65 y.o.   MRN: [id number]   Tony Watson is a 65 y.o. male who presents today for an acute visit.    HPI Comments: Patient for evaluation of pinkeye in bilateral eyes the past 2 and half weeks. Started in right eye. Yellow crusted discharge on eyes being 'stuck shut.' . Eyes sensitive to light. Notes vision is blurry and eyes are itchy. Felt like sandpaper on eyelids however now resolved. Doesn't wear contacts. No fever, chills or eye pain. Viral illness one month ago. Endorses glands are swollen on right side, nasal congestion, and moderate HA from 'eye strain and nasal pressure.' No eye doctor or h/o seasonal allergies. Current smoker.  Past Medical History  Diagnosis Date  . Ischemic cardiomyopathy     mild with EF 42% on myoviews 2008.  Marland Kitchen TIA (transient ischemic attack)     hx; now s/p PFO closure 2004 (in [place])  . DM2 (diabetes mellitus, type 2) (HCC)     A1c 6.0% 12/11  . Smoker   . Diverticulosis 2002  . GERD (gastroesophageal reflux disease)   . Ulcer   . Generalized headaches   . Medical non-compliance   . Nicotine dependence     chronic, active  . COPD (chronic obstructive pulmonary disease) ([place])   . Seasonal allergic rhinitis   . CAD (coronary artery disease)     inferior MI 11/99 (in [place]) with PCI to [facility]. last [place] 2004 [facility]): EF 50%, patent RCA stent. no obstructive disease. last myoview 2008: EF 42%, inferior infarct, no ischemia.   . Systolic heart failure secondary to coronary artery disease ([place]) 05/30/2012  . HTN (hypertension)   . Hyperlipidemia   . Diabetic autonomic neuropathy associated with type 2 diabetes mellitus ([place]) 03/27/2015  . Asthma   . Myocardial infarction [facility])    Allergies: Review of patient's allergies indicates no known allergies. Current Outpatient Prescriptions on File Prior to Visit  Medication Sig Dispense Refill  . carvedilol (COREG) 3.125 MG tablet Take 1  tablet (3.125 mg total) by mouth 2 (two) times daily. 180 tablet 3  . gabapentin (NEURONTIN) 300 MG capsule Take 1 capsule (300 mg total) by mouth at bedtime. 90 capsule 3  . ipratropium-albuterol (DUONEB) 0.5-2.5 (3) MG/3ML SOLN USE 1 VIAL VIA NEBULIZER EVERY 4 HOURS AS NEEDED 540 mL 2  . metFORMIN (GLUCOPHAGE-XR) 500 MG 24 hr tablet TAKE 1 TABLET BY MOUTH ONCE A DAY 90 tablet 0  . tiZANidine (ZANAFLEX) 4 MG tablet Take 1 tablet (4 mg total) by mouth Nightly. 90 tablet 3   No current facility-administered medications on file prior to visit.    Social History  Substance Use Topics  . Smoking status: Current Every Day Smoker -- 1.00 packs/day for 45 years    Types: Cigarettes  . Smokeless tobacco: Never Used     Comment: 1 ppd +40 years  . Alcohol Use: 0.0 oz/week    0 Standard drinks or equivalent per week     Comment: beer weekly    Review of Systems  Constitutional: Negative for fever and chills.  HENT: Negative for congestion, ear pain, rhinorrhea, sinus pressure and sore throat.   Eyes: Positive for photophobia, discharge, redness, itching and visual disturbance. Negative for pain.  Respiratory: Negative for cough, shortness of breath and wheezing.   Cardiovascular: Negative for chest pain and palpitations.  Gastrointestinal: Negative for nausea, vomiting and diarrhea.  Genitourinary:  Negative for dysuria.  Musculoskeletal: Negative for myalgias.  Skin: Negative for rash.  Neurological: Negative for headaches.  Hematological: Positive for adenopathy (swollen neck glands).      Objective:    BP 148/64 mmHg  Pulse 105  Temp(Src) 98.6 F (37 C) (Oral)  Wt 198 lb 8 oz (90.039 kg)  SpO2 95%   Physical Exam  Constitutional: Vital signs are normal. He appears well-developed and well-nourished.  HENT:  Head: Normocephalic and atraumatic.  Right Ear: Hearing, tympanic membrane, external ear and ear canal normal. No drainage, swelling or tenderness. Tympanic membrane is not  injected, not erythematous and not bulging. No middle ear effusion. No decreased hearing is noted.  Left Ear: Hearing, tympanic membrane, external ear and ear canal normal. No drainage, swelling or tenderness. Tympanic membrane is not injected, not erythematous and not bulging.  No middle ear effusion. No decreased hearing is noted.  Nose: Nose normal. Right sinus exhibits no maxillary sinus tenderness and no frontal sinus tenderness. Left sinus exhibits no maxillary sinus tenderness and no frontal sinus tenderness.  Mouth/Throat: Uvula is midline, oropharynx is clear and moist and mucous membranes are normal. No oropharyngeal exudate, posterior oropharyngeal edema, posterior oropharyngeal erythema or tonsillar abscesses.  Eyes: EOM and lids are normal. Pupils are equal, round, and reactive to light. Lids are everted and swept, no foreign bodies found. Right conjunctiva is injected. Left conjunctiva is injected.  No external eye lesions. Surrounding skin intact.   Bilateral eyes:   Diffuse injection of the conjunctiva. No white spots, opacity, or foreign body appreciated. No collection of blood or pus in the anterior chamber. No ciliary flush surrounding iris.   No marked photophobia or eye pain appreciated during exam.     Cardiovascular: Regular rhythm and normal heart sounds.   Pulmonary/Chest: Effort normal and breath sounds normal. No respiratory distress. He has no wheezes. He has no rhonchi. He has no rales.  Lymphadenopathy:       Head (right side): Tonsillar adenopathy present. No submental, no submandibular, no preauricular, no posterior auricular and no occipital adenopathy present.       Head (left side): Tonsillar adenopathy present. No submental, no submandibular, no preauricular, no posterior auricular and no occipital adenopathy present.    He has no cervical adenopathy.       Right cervical: No superficial cervical, no deep cervical and no posterior cervical adenopathy present.       Left cervical: No superficial cervical, no deep cervical and no posterior cervical adenopathy present.  Tender, movable, bilateral lymph nodes tonsillar. Approximately 1 cm in diameter  Neurological: He is alert.  Skin: Skin is warm and dry.  Psychiatric: He has a normal mood and affect. His speech is normal and behavior is normal.  Vitals reviewed.      Assessment & Plan:   1. Acute bacterial conjunctivitis of both eyes Suspect bacterial conjunctivae does however patient describes photophobia which is of concern. He is going straight [facility] for further evaluation. - Ambulatory referral to Ophthalmology  2. Enlarged lymph nodes Suspect reactive lymph nodes in context of illness. Advised patient if they do not resolve on their own once illness resolves to follow up with PCP. He agreed.    I am having Mr. Lilly maintain his carvedilol, tiZANidine, gabapentin, metFORMIN, and ipratropium-albuterol.   No orders of the defined types were placed in this encounter.    Return precautions given.   Risks, benefits, and alternatives of the medications and treatment plan  prescribed today were discussed, and patient expressed understanding.   Education regarding symptom management and diagnosis given to patient on AVS.  Continue to follow with Owens Loffler, MD for routine health maintenance.   [name] and I agreed with plan.   Mable Paris, FNP

## 2016-02-28 ENCOUNTER — Ambulatory Visit: Payer: 59 | Admitting: Family Medicine

## 2016-03-03 ENCOUNTER — Telehealth: Payer: Self-pay

## 2016-03-03 DIAGNOSIS — J449 Chronic obstructive pulmonary disease, unspecified: Secondary | ICD-10-CM

## 2016-03-03 NOTE — Telephone Encounter (Signed)
Tony Watson left v/m; pts nebulizer(that is 64 yrs old) has stopped working and request order sent to [place] Fax # [phone] and phone # [phone]. Pt last seen 02/27/16.

## 2016-03-04 NOTE — Telephone Encounter (Signed)
Can you help me do this? 

## 2016-03-04 NOTE — Telephone Encounter (Signed)
DME order for Nebulizer machine entered in [employer].  In basket message sent to [facility] with [facility] that order has been done.

## 2016-03-25 ENCOUNTER — Other Ambulatory Visit: Payer: Self-pay | Admitting: Family Medicine

## 2016-03-27 ENCOUNTER — Other Ambulatory Visit: Payer: Self-pay | Admitting: Family Medicine

## 2016-04-27 ENCOUNTER — Inpatient Hospital Stay: Payer: 59

## 2016-04-27 ENCOUNTER — Ambulatory Visit
Admission: RE | Admit: 2016-04-27 | Discharge: 2016-04-27 | Disposition: A | Discharge status: Home / Self Care | Payer: 59 | Source: Ambulatory Visit | Attending: Oncology | Admitting: Oncology

## 2016-04-27 ENCOUNTER — Other Ambulatory Visit: Payer: Self-pay

## 2016-04-27 DIAGNOSIS — I10 Essential (primary) hypertension: Secondary | ICD-10-CM | POA: Diagnosis not present

## 2016-04-27 DIAGNOSIS — R911 Solitary pulmonary nodule: Secondary | ICD-10-CM | POA: Insufficient documentation

## 2016-04-27 DIAGNOSIS — D696 Thrombocytopenia, unspecified: Secondary | ICD-10-CM | POA: Diagnosis not present

## 2016-04-27 DIAGNOSIS — I2510 Atherosclerotic heart disease of native coronary artery without angina pectoris: Secondary | ICD-10-CM | POA: Diagnosis not present

## 2016-04-27 DIAGNOSIS — D709 Neutropenia, unspecified: Secondary | ICD-10-CM | POA: Insufficient documentation

## 2016-04-27 DIAGNOSIS — J449 Chronic obstructive pulmonary disease, unspecified: Secondary | ICD-10-CM | POA: Insufficient documentation

## 2016-04-27 DIAGNOSIS — R5383 Other fatigue: Secondary | ICD-10-CM | POA: Diagnosis not present

## 2016-04-27 DIAGNOSIS — E119 Type 2 diabetes mellitus without complications: Secondary | ICD-10-CM | POA: Insufficient documentation

## 2016-04-27 DIAGNOSIS — R918 Other nonspecific abnormal finding of lung field: Secondary | ICD-10-CM | POA: Insufficient documentation

## 2016-04-27 DIAGNOSIS — Z8673 Personal history of transient ischemic attack (TIA), and cerebral infarction without residual deficits: Secondary | ICD-10-CM | POA: Insufficient documentation

## 2016-04-27 DIAGNOSIS — Z79899 Other long term (current) drug therapy: Secondary | ICD-10-CM | POA: Insufficient documentation

## 2016-04-27 DIAGNOSIS — K219 Gastro-esophageal reflux disease without esophagitis: Secondary | ICD-10-CM | POA: Diagnosis not present

## 2016-04-27 DIAGNOSIS — F17210 Nicotine dependence, cigarettes, uncomplicated: Secondary | ICD-10-CM | POA: Diagnosis not present

## 2016-04-27 DIAGNOSIS — C9310 Chronic myelomonocytic leukemia not having achieved remission: Secondary | ICD-10-CM | POA: Insufficient documentation

## 2016-04-27 DIAGNOSIS — J439 Emphysema, unspecified: Secondary | ICD-10-CM | POA: Diagnosis not present

## 2016-04-27 DIAGNOSIS — E785 Hyperlipidemia, unspecified: Secondary | ICD-10-CM | POA: Diagnosis not present

## 2016-04-27 DIAGNOSIS — C9311 Chronic myelomonocytic leukemia, in remission: Secondary | ICD-10-CM

## 2016-04-27 DIAGNOSIS — I700 Atherosclerosis of aorta: Secondary | ICD-10-CM | POA: Diagnosis not present

## 2016-04-27 DIAGNOSIS — I252 Old myocardial infarction: Secondary | ICD-10-CM | POA: Diagnosis not present

## 2016-04-27 LAB — CBC WITH DIFFERENTIAL/PLATELET
Basophils Absolute: 0 10*3/uL (ref 0–0.1)
Basophils Relative: 0 %
EOS PCT: 0 %
Eosinophils Absolute: 0 10*3/uL (ref 0–0.7)
HCT: 36 % — ABNORMAL LOW (ref 40.0–52.0)
Hemoglobin: 12.6 g/dL — ABNORMAL LOW (ref 13.0–18.0)
LYMPHS ABS: 2 10*3/uL (ref 1.0–3.6)
LYMPHS PCT: 45 %
MCH: 33.4 pg (ref 26.0–34.0)
MCHC: 35.1 g/dL (ref 32.0–36.0)
MCV: 95.2 fL (ref 80.0–100.0)
MONO ABS: 1.7 10*3/uL — AB (ref 0.2–1.0)
MONOS PCT: 38 %
Neutro Abs: 0.8 10*3/uL — ABNORMAL LOW (ref 1.4–6.5)
Neutrophils Relative %: 17 %
PLATELETS: 124 10*3/uL — AB (ref 150–440)
RBC: 3.78 MIL/uL — ABNORMAL LOW (ref 4.40–5.90)
RDW: 15.1 % — AB (ref 11.5–14.5)
WBC: 4.6 10*3/uL (ref 3.8–10.6)

## 2016-04-27 NOTE — Progress Notes (Signed)
[place]  [phone] [phone]  ID: Tony Watson OB: [birth year]  MR#: [id number]  XFG#:[id number]  Patient Care Team: Owens Loffler, MD as PCP - General  CHIEF COMPLAINT: Bone marrow biopsy proven CMML-1.  INTERVAL HISTORY: Patient returns to clinic today for repeat laboratory work and further evaluation. Patient reports increasing weakness and fatigue over the past several weeks, but otherwise has felt well. He currently feels well and is asymptomatic. He denies any fevers, chills, or night sweats. He has no neurologic complaints. He has a good appetite and denies weight loss. He has no chest pain or shortness of breath. He denies any nausea, vomiting, constipation, or diarrhea. He has no urinary complaints. Patient offers no further specific complaints today.  REVIEW OF SYSTEMS:   Review of Systems  Constitutional: Positive for malaise/fatigue. Negative for diaphoresis, fever and weight loss.  Respiratory: Negative for cough and shortness of breath.   Cardiovascular: Negative.  Negative for chest pain.  Gastrointestinal: Negative.   Genitourinary: Negative.   Musculoskeletal: Negative.   Neurological: Positive for weakness.  Endo/Heme/Allergies: Does not bruise/bleed easily.  Psychiatric/Behavioral: Negative.  The patient is not nervous/anxious.     As per HPI. Otherwise, a complete review of systems is negative.  PAST MEDICAL HISTORY: Past Medical History:  Diagnosis Date  . Asthma   . CAD (coronary artery disease)    inferior MI 11/99 (in [place]) with PCI to [facility]. last [place] 2004 [facility]): EF 50%, patent RCA stent. no obstructive disease. last myoview 2008: EF 42%, inferior infarct, no ischemia.   Marland Kitchen COPD (chronic obstructive pulmonary disease) ([place])   . Diabetic autonomic neuropathy associated with type 2 diabetes mellitus ([place]) 03/27/2015  . Diverticulosis 2002  . DM2 (diabetes mellitus, type 2) (HCC)    A1c 6.0% 12/11  .  Generalized headaches   . GERD (gastroesophageal reflux disease)   . HTN (hypertension)   . Hyperlipidemia   . Ischemic cardiomyopathy    mild with EF 42% on myoviews 2008.  . Medical non-compliance   . Myocardial infarction ([place])   . Nicotine dependence    chronic, active  . Seasonal allergic rhinitis   . Smoker   . Systolic heart failure secondary to coronary artery disease ([place]) 05/30/2012  . TIA (transient ischemic attack)    hx; now s/p PFO closure 2004 (in [place])  . Ulcer     PAST SURGICAL HISTORY: Past Surgical History:  Procedure Laterality Date  . CARDIAC CATHETERIZATION  11/99   CI per PMH  . open heart surgery  2004   PFO repair    FAMILY HISTORY Family History  Problem Relation Age of Onset  . Coronary artery disease      family hx  . Breast cancer      1st egree relative <50       ADVANCED DIRECTIVES:    HEALTH MAINTENANCE: Social History  Substance Use Topics  . Smoking status: Current Every Day Smoker    Packs/day: 1.00    Years: 45.00    Types: Cigarettes  . Smokeless tobacco: Never Used     Comment: 1 ppd +40 years  . Alcohol use 0.0 oz/week     Comment: beer weekly     Colonoscopy:  PAP:  Bone density:  Lipid panel:  No Known Allergies  Current Outpatient Prescriptions  Medication Sig Dispense Refill  . carvedilol (COREG) 3.125 MG tablet Take 1 tablet (3.125 mg total) by mouth 2 (two) times daily. 180 tablet 3  .  gabapentin (NEURONTIN) 300 MG capsule TAKE 1 CAPSULE (300 MG TOTAL) BY MOUTH AT BEDTIME. 90 capsule 3  . ipratropium-albuterol (DUONEB) 0.5-2.5 (3) MG/3ML SOLN USE 1 VIAL VIA NEBULIZER EVERY 4 HOURS AS NEEDED 540 mL 2  . metFORMIN (GLUCOPHAGE-XR) 500 MG 24 hr tablet TAKE 1 TABLET BY MOUTH ONCE A DAY 90 tablet 0  . tiZANidine (ZANAFLEX) 4 MG tablet TAKE 1 TABLET (4 MG TOTAL) BY MOUTH NIGHTLY. 90 tablet 3   No current facility-administered medications for this visit.     OBJECTIVE: Vitals:   04/29/16 1502  BP: (!)  168/73  Pulse: (!) 108  Resp: 18  Temp: 97.3 F (36.3 C)     Body mass index is 27.65 kg/m.    ECOG FS:0 - Asymptomatic  General: Well-developed, well-nourished, no acute distress. Eyes: Pink conjunctiva, anicteric sclera. Lungs: Clear to auscultation bilaterally. Heart: Regular rate and rhythm. No rubs, murmurs, or gallops. Abdomen: Soft, nontender, nondistended. No organomegaly noted, normoactive bowel sounds. Musculoskeletal: No edema, cyanosis, or clubbing. Neuro: Alert, answering all questions appropriately. Cranial nerves grossly intact. Skin: No rashes or petechiae noted. Psych: Normal affect.   LAB RESULTS:  Lab Results  Component Value Date   NA 131 (L) 07/15/2015   K 3.7 07/15/2015   CL 99 (L) 07/15/2015   CO2 22 07/15/2015   GLUCOSE 118 (H) 07/15/2015   BUN 14 07/15/2015   CREATININE 0.87 07/15/2015   CALCIUM 9.1 07/15/2015   PROT 8.3 (H) 07/15/2015   ALBUMIN 4.1 07/15/2015   AST 30 07/15/2015   ALT 26 07/15/2015   ALKPHOS 31 (L) 07/15/2015   BILITOT 0.5 07/15/2015   GFRNONAA >60 07/15/2015   GFRAA >60 07/15/2015    Lab Results  Component Value Date   WBC 4.6 04/27/2016   NEUTROABS 0.8 (L) 04/27/2016   HGB 12.6 (L) 04/27/2016   HCT 36.0 (L) 04/27/2016   MCV 95.2 04/27/2016   PLT 124 (L) 04/27/2016     STUDIES: Ct Chest Wo Contrast  Result Date: 04/27/2016 CLINICAL DATA:  Followup bilateral pulmonary nodules and ground-glass opacity. EXAM: CT CHEST WITHOUT CONTRAST TECHNIQUE: Multidetector CT imaging of the chest was performed following the standard protocol without IV contrast. COMPARISON:  10/21/2015 FINDINGS: Cardiovascular: Normal heart size. Three-vessel coronary artery calcification again noted. Aortic atherosclerosis. Mediastinum/Nodes: No pathologically enlarged lymph nodes or other masses identified on this unenhanced exam. Lungs/Pleura: Mild emphysema again noted. Mild scarring again seen in peripheral right upper lobe. Previously seen  peribronchovascular ground-glass nodules in both lungs has resolved since prior study, consistent with resolving atypical infectious or inflammatory process. Upper Abdomen: No acute abnormality. Musculoskeletal: No suspicious bone lesions or other significant abnormality identified. IMPRESSION: Interval resolution of bilateral peribronchovascular ground-glass nodules, consistent with resolving atypical infectious or inflammatory process. Mild emphysema and right upper lobe scarring.  No acute findings. Aortic and coronary artery atherosclerosis. Electronically Signed   By: Earle Gell M.D.   On: 04/27/2016 12:23    ASSESSMENT: Bone marrow biopsy proven CMML-1.  PLAN:    1. CMML: Bone marrow biopsy completed on August 30, 2015 confirmed the diagnosis.  Patient's absolute monocytosis has been greater than 1.0 and persistent since at least July 2014. Today's result is 1.7.  The remainder of his blood work including BCR-ABL and ANA are negative or within normal limits. Previously, hepatitis C and HIV were also negative.  He will likely need treatment with azacitidine or decitabine at some point, but not at this moment. He has no evidence of end  organ involvement other than a mild thrombocytopenia which is also stable. Return to clinic in 3 months for laboratory work and then in 6 months for laboratory work and further evaluation. 2. Thrombocytopenia: Likely secondary to CMML. Bone marrow biopsy as above. 3. Neutropenia: Also likely secondary to CML, monitor. 4. Pulmonary nodule: Repeat CT scan results from April 27, 2016 reviewed independently and reported as above with resolution of pulmonary nodules. No further imaging is necessary. 5. Fatigue: Unclear etiology. Patient's hemoglobin is within normal limits. Thyroid panel, iron studies, B-12, and folate are pending at time of dictation.  Approximately 30 minutes was spent in discussion of which greater than 50% was consultation.  Patient expressed  understanding and was in agreement with this plan. He also understands that He can call clinic at any time with any questions, concerns, or complaints.     Lloyd Huger, MD   04/29/2016 4:14 PM

## 2016-04-29 ENCOUNTER — Inpatient Hospital Stay: Payer: 59 | Attending: Oncology | Admitting: Oncology

## 2016-04-29 ENCOUNTER — Inpatient Hospital Stay: Payer: 59

## 2016-04-29 ENCOUNTER — Other Ambulatory Visit: Payer: 59

## 2016-04-29 VITALS — BP 168/73 | HR 108 | Temp 97.3°F | Resp 18 | Wt 199.6 lb

## 2016-04-29 DIAGNOSIS — Z79899 Other long term (current) drug therapy: Secondary | ICD-10-CM

## 2016-04-29 DIAGNOSIS — R911 Solitary pulmonary nodule: Secondary | ICD-10-CM

## 2016-04-29 DIAGNOSIS — F17210 Nicotine dependence, cigarettes, uncomplicated: Secondary | ICD-10-CM

## 2016-04-29 DIAGNOSIS — D709 Neutropenia, unspecified: Secondary | ICD-10-CM

## 2016-04-29 DIAGNOSIS — C9310 Chronic myelomonocytic leukemia not having achieved remission: Secondary | ICD-10-CM | POA: Diagnosis not present

## 2016-04-29 DIAGNOSIS — D696 Thrombocytopenia, unspecified: Secondary | ICD-10-CM | POA: Diagnosis not present

## 2016-04-29 DIAGNOSIS — R5383 Other fatigue: Secondary | ICD-10-CM

## 2016-04-29 LAB — VITAMIN B12: VITAMIN B 12: 1012 pg/mL — AB (ref 180–914)

## 2016-04-29 LAB — IRON AND TIBC
Iron: 108 ug/dL (ref 45–182)
SATURATION RATIOS: 30 % (ref 17.9–39.5)
TIBC: 361 ug/dL (ref 250–450)
UIBC: 253 ug/dL

## 2016-04-29 LAB — TSH: TSH: 1.12 u[IU]/mL (ref 0.350–4.500)

## 2016-04-29 LAB — FOLATE: FOLATE: 9.9 ng/mL (ref >5.9)

## 2016-04-29 LAB — FERRITIN: FERRITIN: 82 ng/mL (ref 24–336)

## 2016-04-29 NOTE — Progress Notes (Signed)
States has been having more frequent infections that are concerning for the patient and spouse.

## 2016-04-30 LAB — T4: T4 TOTAL: 5.7 ug/dL (ref 4.5–12.0)

## 2016-05-25 ENCOUNTER — Telehealth: Payer: Self-pay | Admitting: Family Medicine

## 2016-05-25 NOTE — Telephone Encounter (Signed)
Please call and schedule CPE with fasting labs prior for Dr. Copland.  

## 2016-05-25 NOTE — Telephone Encounter (Signed)
Ok to refill 3 month supply of both only, 0 ref  F/u CPX

## 2016-05-25 NOTE — Telephone Encounter (Signed)
Last office visit 01/24/2016 for Sore Throat.  Last CPE & Hgb A1c 03/27/2015.  Refill?

## 2016-05-27 NOTE — Telephone Encounter (Signed)
Left message asking pt to call office  °

## 2016-06-09 ENCOUNTER — Other Ambulatory Visit: Payer: Self-pay | Admitting: Family Medicine

## 2016-06-09 ENCOUNTER — Telehealth: Payer: Self-pay | Admitting: Family Medicine

## 2016-06-09 NOTE — Telephone Encounter (Signed)
[facility] has already notified us of flu vaccine and it has been entered into Tony Watson record.

## 2016-06-09 NOTE — Telephone Encounter (Signed)
Pt called to let us know that he got his flu shot at [facility] on Oct 18h No call back needed

## 2016-06-09 NOTE — Telephone Encounter (Signed)
Pt left a message on Triage VM to make appointment for CPE.

## 2016-06-10 NOTE — Telephone Encounter (Signed)
11/30 labs 12/4 cpx Pt aware

## 2016-06-29 ENCOUNTER — Encounter: Payer: Self-pay | Admitting: Family Medicine

## 2016-06-29 ENCOUNTER — Ambulatory Visit (INDEPENDENT_AMBULATORY_CARE_PROVIDER_SITE_OTHER): Payer: 59 | Admitting: Family Medicine

## 2016-06-29 VITALS — BP 130/64 | HR 98 | Temp 98.5°F | Ht 71.25 in | Wt 199.5 lb

## 2016-06-29 DIAGNOSIS — J441 Chronic obstructive pulmonary disease with (acute) exacerbation: Secondary | ICD-10-CM

## 2016-06-29 DIAGNOSIS — J208 Acute bronchitis due to other specified organisms: Secondary | ICD-10-CM | POA: Diagnosis not present

## 2016-06-29 MED ORDER — LEVOFLOXACIN 500 MG PO TABS: 500.0000 mg | ORAL_TABLET | Freq: Every day | ORAL | 0 refills | Status: DC

## 2016-06-29 MED ORDER — PREDNISONE 20 MG PO TABS: ORAL_TABLET | ORAL | 0 refills | Status: DC

## 2016-06-29 MED ORDER — IPRATROPIUM-ALBUTEROL 0.5-2.5 (3) MG/3ML IN SOLN: 3.0000 mL | RESPIRATORY_TRACT | 2 refills | Status: DC | PRN

## 2016-06-29 NOTE — Progress Notes (Signed)
Dr. Frederico Hamman T. Shirel Mallis, MD, [facility] [place], [address] Phone: [phone] Fax: [phone]  06/29/2016  Patient: Tony Watson, MRN: [id number], DOB: 04-13-1951, 65 y.o.  Primary Physician:  Owens Loffler, MD   Chief Complaint  Patient presents with  . Cough    with chest congestion  . Sore Throat   Subjective:   Tony Watson is a 65 y.o. very pleasant male patient who presents with the following:  COPD, CML: Throat is swollen, coughing on choking, liquids. Hard time eating. Breathing not the best.  Using the Duoneb. 8-9 times a day.   Long history of smoking and COPD that is severe in character. Intermittently compliant with his medications. Now he is doing DuoNeb 8-9 times a day. He is close to running out of his nebulizer medication.  He also is having a hard time eating and swallowing and is coughing up sputum is green or yellow most of the day. He has been able work of the last 7 days.  Past Medical History, Surgical History, Social History, Family History, Problem List, Medications, and Allergies have been reviewed and updated if relevant.  Patient Active Problem List   Diagnosis Date Noted  . 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 COPD, severe 03/23/2011    Priority: High  . Type 2 diabetes mellitus with vascular disease ([place]) 01/02/2010    Priority: High  . Diabetic autonomic neuropathy associated with type 2 diabetes mellitus ([place]) 03/27/2015  . History of inferior MI (myocardial infarction) 10/14/2013  . CAD (coronary artery disease)   . HTN (hypertension)   . History of noncompliance with medical treatment 05/30/2012  . Systolic heart failure secondary to coronary artery disease ([place]) 05/30/2012  . ALLERGIC RHINITIS CAUSE UNSPECIFIED 07/31/2010  . TOBACCO USE 04/17/2009  . TRANSIENT ISCHEMIC ATTACKS, HX OF 04/17/2009  .  Hyperlipidemia 12/19/2008  . HYPERTENSION, UNSPECIFIED 12/19/2008    Past Medical History:  Diagnosis Date  . Asthma   . CAD (coronary artery disease)    inferior MI 11/99 (in [place]) with PCI to [facility]. last [place] 2004 [facility]): EF 50%, patent RCA stent. no obstructive disease. last myoview 2008: EF 42%, inferior infarct, no ischemia.   Marland Kitchen COPD (chronic obstructive pulmonary disease) ([place])   . Diabetic autonomic neuropathy associated with type 2 diabetes mellitus ([place]) 03/27/2015  . Diverticulosis 2002  . DM2 (diabetes mellitus, type 2) (HCC)    A1c 6.0% 12/11  . Generalized headaches   . GERD (gastroesophageal reflux disease)   . HTN (hypertension)   . Hyperlipidemia   . Ischemic cardiomyopathy    mild with EF 42% on myoviews 2008.  . Medical non-compliance   . Myocardial infarction   . Nicotine dependence    chronic, active  . Seasonal allergic rhinitis   . Smoker   . Systolic heart failure secondary to coronary artery disease ([place]) 05/30/2012  . TIA (transient ischemic attack)    hx; now s/p PFO closure 2004 (in [place])  . Ulcer [facility])     Past Surgical History:  Procedure Laterality Date  . CARDIAC CATHETERIZATION  11/99   CI per PMH  . open heart surgery  2004   PFO repair    Social History   Social History  . Marital status: Married    Spouse name: N/A  . Number of children: N/A  . Years of education: N/A  Occupational History  . Not on file.   Social History Main Topics  . Smoking status: Current Every Day Smoker    Packs/day: 1.00    Years: 45.00    Types: Cigarettes  . Smokeless tobacco: Never Used     Comment: 1 ppd +40 years  . Alcohol use 0.0 oz/week     Comment: beer weekly  . Drug use: No  . Sexual activity: Not on file   Other Topics Concern  . Not on file   Social History Narrative   Married, 1 daughter. 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 Lives in [place], [place]    Family History  Problem Relation Age of Onset  .  Coronary artery disease      family hx  . Breast cancer      1st egree relative <50    No Known Allergies  Medication list reviewed and updated in full in [facility].  ROS: GEN: Acute illness details above GI: Tolerating PO intake GU: maintaining adequate hydration and urination Pulm: No SOB Interactive and getting along well at home.  Otherwise, ROS is as per the HPI.  Objective:   BP 130/64   Pulse 98   Temp 98.5 F (36.9 C) (Oral)   Ht 5' 11.25" (1.81 m)   Wt 199 lb 8 oz (90.5 kg)   SpO2 96%   BMI 27.63 kg/m    GEN: A and O x 3. WDWN. NAD.    ENT: Nose clear, ext NML.  No LAD.  No JVD.  TM's clear. Oropharynx clear.  PULM: Normal WOB, no distress. Wheezes and rhonchi are present throughout most of the patient's lung fields.  CV: RRR, no M/G/R, No rubs, No JVD.   EXT: warm and well-perfused, No c/c/e. PSYCH: Pleasant and conversant.    Laboratory and Imaging Data:  Assessment and Plan:   COPD exacerbation ([place])  Acute bronchitis due to other specified organisms  Combination COPD exacerbation with bronchitis versus pneumonia.  Follow-up: No Follow-up on file.  Meds ordered this encounter  Medications  . levofloxacin (LEVAQUIN) 500 MG tablet    Sig: Take 1 tablet (500 mg total) by mouth daily.    Dispense:  10 tablet    Refill:  0  . predniSONE (DELTASONE) 20 MG tablet    Sig: 2 tabs po for 5 days, then 1 tab for 5 days    Dispense:  15 tablet    Refill:  0  . ipratropium-albuterol (DUONEB) 0.5-2.5 (3) MG/3ML SOLN    Sig: Take 3 mLs by nebulization every 4 (four) hours as needed.    Dispense:  540 mL    Refill:  2   Medications Discontinued During This Encounter  Medication Reason  . ipratropium-albuterol (DUONEB) 0.5-2.5 (3) MG/3ML SOLN Reorder   No orders of the defined types were placed in this encounter.   Signed,  Maud Deed. Adhvik Canady, MD     Medication List       Accurate as of 06/29/16  1:40 PM. Always use your most recent  med list.          carvedilol 3.125 MG tablet Commonly known as:  COREG TAKE 1 TABLET (3.125 MG TOTAL) BY MOUTH 2 (TWO) TIMES DAILY.   gabapentin 300 MG capsule Commonly known as:  NEURONTIN TAKE 1 CAPSULE (300 MG TOTAL) BY MOUTH AT BEDTIME.   ipratropium-albuterol 0.5-2.5 (3) MG/3ML Soln Commonly known as:  DUONEB Take 3 mLs by nebulization every 4 (four) hours  as needed.   levofloxacin 500 MG tablet Commonly known as:  LEVAQUIN Take 1 tablet (500 mg total) by mouth daily.   metFORMIN 500 MG 24 hr tablet Commonly known as:  GLUCOPHAGE-XR TAKE 1 TABLET (500 MG TOTAL) BY MOUTH DAILY WITH BREAKFAST.   predniSONE 20 MG tablet Commonly known as:  DELTASONE 2 tabs po for 5 days, then 1 tab for 5 days   tiZANidine 4 MG tablet Commonly known as:  ZANAFLEX TAKE 1 TABLET (4 MG TOTAL) BY MOUTH NIGHTLY.

## 2016-06-29 NOTE — Progress Notes (Signed)
Pre visit review using our clinic review tool, if applicable. No additional management support is needed unless otherwise documented below in the visit note. 

## 2016-07-09 ENCOUNTER — Other Ambulatory Visit: Payer: 59

## 2016-07-13 ENCOUNTER — Encounter: Payer: 59 | Admitting: Family Medicine

## 2016-07-13 ENCOUNTER — Other Ambulatory Visit: Payer: Self-pay | Admitting: Family Medicine

## 2016-07-13 DIAGNOSIS — Z125 Encounter for screening for malignant neoplasm of prostate: Secondary | ICD-10-CM

## 2016-07-13 DIAGNOSIS — Z79899 Other long term (current) drug therapy: Secondary | ICD-10-CM

## 2016-07-13 DIAGNOSIS — E538 Deficiency of other specified B group vitamins: Secondary | ICD-10-CM

## 2016-07-13 DIAGNOSIS — E119 Type 2 diabetes mellitus without complications: Secondary | ICD-10-CM

## 2016-07-13 DIAGNOSIS — E7849 Other hyperlipidemia: Secondary | ICD-10-CM

## 2016-07-13 DIAGNOSIS — E559 Vitamin D deficiency, unspecified: Secondary | ICD-10-CM

## 2016-07-14 ENCOUNTER — Other Ambulatory Visit: Payer: Self-pay | Admitting: Family Medicine

## 2016-07-14 NOTE — Progress Notes (Signed)
Opened in error

## 2016-07-16 ENCOUNTER — Other Ambulatory Visit (INDEPENDENT_AMBULATORY_CARE_PROVIDER_SITE_OTHER): Payer: 59

## 2016-07-16 DIAGNOSIS — E538 Deficiency of other specified B group vitamins: Secondary | ICD-10-CM

## 2016-07-16 DIAGNOSIS — E784 Other hyperlipidemia: Secondary | ICD-10-CM

## 2016-07-16 DIAGNOSIS — E119 Type 2 diabetes mellitus without complications: Secondary | ICD-10-CM

## 2016-07-16 DIAGNOSIS — E559 Vitamin D deficiency, unspecified: Secondary | ICD-10-CM | POA: Diagnosis not present

## 2016-07-16 DIAGNOSIS — Z125 Encounter for screening for malignant neoplasm of prostate: Secondary | ICD-10-CM | POA: Diagnosis not present

## 2016-07-16 DIAGNOSIS — Z79899 Other long term (current) drug therapy: Secondary | ICD-10-CM | POA: Diagnosis not present

## 2016-07-16 DIAGNOSIS — E7849 Other hyperlipidemia: Secondary | ICD-10-CM

## 2016-07-16 LAB — MICROALBUMIN / CREATININE URINE RATIO
Creatinine,U: 56.9 mg/dL
Microalb Creat Ratio: 1.2 mg/g (ref 0.0–30.0)
Microalb, Ur: 0.7 mg/dL (ref 0.0–1.9)

## 2016-07-16 LAB — LIPID PANEL
CHOLESTEROL: 144 mg/dL (ref 0–200)
HDL: 32.9 mg/dL — ABNORMAL LOW (ref >39.00)
LDL CALC: 79 mg/dL (ref 0–99)
NonHDL: 110.73
TRIGLYCERIDES: 159 mg/dL — AB (ref 0.0–149.0)
Total CHOL/HDL Ratio: 4
VLDL: 31.8 mg/dL (ref 0.0–40.0)

## 2016-07-16 LAB — HEPATIC FUNCTION PANEL
ALT: 24 U/L (ref 0–53)
AST: 24 U/L (ref 0–37)
Albumin: 4.2 g/dL (ref 3.5–5.2)
Alkaline Phosphatase: 46 U/L (ref 39–117)
BILIRUBIN DIRECT: 0.1 mg/dL (ref 0.0–0.3)
BILIRUBIN TOTAL: 0.4 mg/dL (ref 0.2–1.2)
Total Protein: 7.5 g/dL (ref 6.0–8.3)

## 2016-07-16 LAB — CBC WITH DIFFERENTIAL/PLATELET
Basophils Relative: 0.3 % (ref 0.0–3.0)
EOS PCT: 0.1 % (ref 0.0–5.0)
HEMATOCRIT: 36.9 % — AB (ref 39.0–52.0)
HEMOGLOBIN: 12.6 g/dL — AB (ref 13.0–17.0)
Lymphocytes Relative: 25.9 % (ref 12.0–46.0)
MCHC: 34.2 g/dL (ref 30.0–36.0)
MCV: 96.7 fl (ref 78.0–100.0)
Monocytes Relative: 44.8 % — ABNORMAL HIGH (ref 3.0–12.0)
Neutrophils Relative %: 28.9 % — ABNORMAL LOW (ref 43.0–77.0)
Platelets: 124 10*3/uL — ABNORMAL LOW (ref 150.0–400.0)
RBC: 3.82 Mil/uL — ABNORMAL LOW (ref 4.22–5.81)
RDW: 15.9 % — AB (ref 11.5–15.5)
WBC: 7.1 10*3/uL (ref 4.0–10.5)

## 2016-07-16 LAB — BASIC METABOLIC PANEL
BUN: 10 mg/dL (ref 6–23)
CALCIUM: 9.2 mg/dL (ref 8.4–10.5)
CO2: 29 mEq/L (ref 19–32)
Chloride: 103 mEq/L (ref 96–112)
Creatinine, Ser: 0.88 mg/dL (ref 0.40–1.50)
GFR: 92.5 mL/min (ref >60.00)
GLUCOSE: 122 mg/dL — AB (ref 70–99)
Potassium: 4.8 mEq/L (ref 3.5–5.1)
Sodium: 140 mEq/L (ref 135–145)

## 2016-07-16 LAB — VITAMIN D 25 HYDROXY (VIT D DEFICIENCY, FRACTURES): VITD: 23.87 ng/mL — AB (ref 30.00–100.00)

## 2016-07-16 LAB — VITAMIN B12: Vitamin B-12: 1071 pg/mL — ABNORMAL HIGH (ref 211–911)

## 2016-07-16 LAB — HEMOGLOBIN A1C: Hgb A1c MFr Bld: 6.5 % (ref 4.6–6.5)

## 2016-07-16 LAB — PSA: PSA: 1.35 ng/mL (ref 0.10–4.00)

## 2016-07-22 ENCOUNTER — Encounter: Payer: Self-pay | Admitting: Family Medicine

## 2016-07-22 ENCOUNTER — Ambulatory Visit (INDEPENDENT_AMBULATORY_CARE_PROVIDER_SITE_OTHER): Payer: 59 | Admitting: Family Medicine

## 2016-07-22 VITALS — BP 146/72 | HR 108 | Temp 98.4°F | Ht 70.0 in | Wt 196.0 lb

## 2016-07-22 DIAGNOSIS — Z0000 Encounter for general adult medical examination without abnormal findings: Secondary | ICD-10-CM

## 2016-07-22 DIAGNOSIS — Z23 Encounter for immunization: Secondary | ICD-10-CM

## 2016-07-22 DIAGNOSIS — Z2911 Encounter for prophylactic immunotherapy for respiratory syncytial virus (RSV): Secondary | ICD-10-CM

## 2016-07-22 MED ORDER — GABAPENTIN 300 MG PO CAPS: 300.0000 mg | ORAL_CAPSULE | Freq: Two times a day (BID) | ORAL | 3 refills | Status: DC

## 2016-07-22 MED ORDER — IPRATROPIUM-ALBUTEROL 0.5-2.5 (3) MG/3ML IN SOLN: 3.0000 mL | RESPIRATORY_TRACT | 5 refills | Status: DC | PRN

## 2016-07-22 MED ORDER — FLUTICASONE-SALMETEROL 250-50 MCG/DOSE IN AEPB: 1.0000 | INHALATION_SPRAY | Freq: Two times a day (BID) | RESPIRATORY_TRACT | 11 refills | Status: DC

## 2016-07-22 NOTE — Progress Notes (Signed)
Dr. Frederico Hamman T. Arhum Peeples, MD, [facility] [place], [address] Phone: [phone] Fax: [phone]  07/22/2016  Patient: Tony Watson, MRN: [id number], DOB: Jun 16, 1951, 65 y.o.  Primary Physician:  Owens Loffler, MD   Chief Complaint  Patient presents with  . Annual Exam   Subjective:   Tony Watson is a 65 y.o. pleasant patient who presents with the following:  Preventative Health Maintenance Visit:  Health Maintenance Summary Reviewed and updated, unless pt declines services.  Tobacco History Reviewed. Alcohol: No concerns, no excessive use Exercise Habits: Some activity, rec at least 30 mins 5 times a week STD concerns: no risk or activity to increase risk Drug Use: None Encouraged self-testicular check  Colon - Patient reports he had colon previously.... Shingles Tdap  Eye  Foot  Neuropathy - hands and fingers SOB  Check for inhaler coupons.  06/2007, Dr. Sonny Masters Colonoscopy   Health Maintenance  Topic Date Due  . FOOT EXAM  12/23/1961  . OPHTHALMOLOGY EXAM  12/23/1961  . HEMOGLOBIN A1C  01/14/2017  . COLONOSCOPY  07/03/2017  . URINE MICROALBUMIN  07/16/2017  . TETANUS/TDAP  07/22/2026  . INFLUENZA VACCINE  Completed  . ZOSTAVAX  Completed  . Hepatitis C Screening  Completed  . HIV Screening  Completed   Immunization History  Administered Date(s) Administered  . Influenza Split 05/13/2012, 06/06/2013  . Influenza, Seasonal, Injecte, Preservative Fre 05/19/2014  . Influenza,inj,Quad PF,36+ Mos 05/31/2015, 05/29/2016  . Pneumococcal Conjugate-13 10/12/2013  . Pneumococcal Polysaccharide-23 05/30/2012  . Tdap 07/22/2016  . Zoster 07/22/2016   Patient Active Problem List   Diagnosis Date Noted  . 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 COPD, severe 03/23/2011    Priority: High  . Type 2 diabetes mellitus with  vascular disease ([place]) 01/02/2010    Priority: High  . Diabetic autonomic neuropathy associated with type 2 diabetes mellitus ([place]) 03/27/2015  . History of inferior MI (myocardial infarction) 10/14/2013  . CAD (coronary artery disease)   . HTN (hypertension)   . History of noncompliance with medical treatment 05/30/2012  . Systolic heart failure secondary to coronary artery disease ([place]) 05/30/2012  . ALLERGIC RHINITIS CAUSE UNSPECIFIED 07/31/2010  . TOBACCO USE 04/17/2009  . TRANSIENT ISCHEMIC ATTACKS, HX OF 04/17/2009  . Hyperlipidemia 12/19/2008  . HYPERTENSION, UNSPECIFIED 12/19/2008   Past Medical History:  Diagnosis Date  . Asthma   . CAD (coronary artery disease)    inferior MI 11/99 (in [place]) with PCI to [facility]. last [place] 2004 [facility] EF 50%, patent RCA stent. no obstructive disease. last myoview 2008: EF 42%, inferior infarct, no ischemia.   Marland Kitchen COPD (chronic obstructive pulmonary disease) ([place])   . Diabetic autonomic neuropathy associated with type 2 diabetes mellitus ([place]) 03/27/2015  . Diverticulosis 2002  . DM2 (diabetes mellitus, type 2) ([facility])    A1c 6.0% 12/11  . Generalized headaches   . GERD (gastroesophageal reflux disease)   . HTN (hypertension)   . Hyperlipidemia   . Ischemic cardiomyopathy    mild with EF 42% on myoviews 2008.  . Medical non-compliance   . Myocardial infarction   . Nicotine dependence    chronic, active  . Seasonal allergic rhinitis   . Smoker   . Systolic heart failure secondary to coronary artery disease ([place]) 05/30/2012  . TIA (transient ischemic attack)    hx; now s/p PFO closure 2004 (in [place])  .  Ulcer [facility])    Past Surgical History:  Procedure Laterality Date  . CARDIAC CATHETERIZATION  11/99   CI per PMH  . open heart surgery  2004   PFO repair   Social History   Social History  . Marital status: Married    Spouse name: N/A  . Number of children: N/A  . Years of education: N/A   Occupational History  . Not on file.    Social History Main Topics  . Smoking status: Current Every Day Smoker    Packs/day: 1.00    Years: 45.00    Types: Cigarettes  . Smokeless tobacco: Never Used     Comment: 1 ppd +40 years  . Alcohol use 0.0 oz/week     Comment: beer weekly  . Drug use: No  . Sexual activity: Not on file   Other Topics Concern  . Not on file   Social History Narrative   Married, 1 daughter. 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 Lives in [place], [place]   Family History  Problem Relation Age of Onset  . Coronary artery disease      family hx  . Breast cancer      1st egree relative <50   No Known Allergies  Medication list has been reviewed and updated.   General: Denies fever, chills, sweats. No significant weight loss. Eyes: Denies blurring,significant itching ENT: Denies earache, sore throat, and hoarseness. Cardiovascular: Denies chest pains, palpitations, dyspnea on exertion Respiratory: Denies cough, dyspnea at rest,wheeezing Breast: no concerns about lumps GI: Denies nausea, vomiting, diarrhea, constipation, change in bowel habits, abdominal pain, melena, hematochezia GU: Denies penile discharge, ED, urinary flow / outflow problems. No STD concerns. Musculoskeletal: Denies back pain, joint pain Derm: Denies rash, itching Neuro: Denies  paresthesias, frequent falls, frequent headaches Psych: Denies depression, anxiety Endocrine: Denies cold intolerance, heat intolerance, polydipsia Heme: Denies enlarged lymph nodes Allergy: No hayfever  Objective:   BP (!) 146/72   Pulse (!) 108   Temp 98.4 F (36.9 C) (Oral)   Ht '5\' 10"'$  (1.778 m)   Wt 196 lb (88.9 kg)   BMI 28.12 kg/m  Ideal Body Weight: Weight in (lb) to have BMI = 25: 173.9  No exam data present  GEN: well developed, well nourished, no acute distress Eyes: conjunctiva and lids normal, PERRLA, EOMI ENT: TM clear, nares clear, oral exam WNL Neck: supple, no lymphadenopathy, no thyromegaly, no  JVD Pulm: clear to auscultation and percussion, respiratory effort normal CV: regular rate and rhythm, S1-S2, no murmur, rub or gallop, no bruits, peripheral pulses normal and symmetric, no cyanosis, clubbing, edema or varicosities GI: soft, non-tender; no hepatosplenomegaly, masses; active bowel sounds all quadrants GU: no hernia, testicular mass, penile discharge Lymph: no cervical, axillary or inguinal adenopathy MSK: gait normal, muscle tone and strength WNL, no joint swelling, effusions, discoloration, crepitus  SKIN: clear, good turgor, color WNL, no rashes, lesions, or ulcerations Neuro: normal mental status, normal strength, sensation, and motion Psych: alert; oriented to person, place and time, normally interactive and not anxious or depressed in appearance. All labs reviewed with patient.  Lipids:    Component Value Date/Time   CHOL 144 07/16/2016 0844   CHOL 106 02/06/2013 0455   TRIG 159.0 (H) 07/16/2016 0844   TRIG 75 02/06/2013 0455   HDL 32.90 (L) 07/16/2016 0844   HDL 37 (L) 02/06/2013 0455   LDLDIRECT 78.0 10/12/2013 1130   VLDL 31.8 07/16/2016 0844   VLDL 15 02/06/2013 0455  CHOLHDL 4 07/16/2016 0844   CBC: CBC Latest Ref Rng & Units 07/16/2016 04/27/2016 01/28/2016  WBC 4.0 - 10.5 K/uL 7.1 4.6 8.5  Hemoglobin 13.0 - 17.0 g/dL 12.6(L) 12.6(L) 13.5  Hematocrit 39.0 - 52.0 % 36.9(L) 36.0(L) 38.4(L)  Platelets 150.0 - 400.0 K/uL 124.0(L) 124(L) 137(L)    Basic Metabolic Panel:    Component Value Date/Time   NA 140 07/16/2016 0844   NA 139 01/21/2014 1743   K 4.8 07/16/2016 0844   K 3.7 01/21/2014 1743   CL 103 07/16/2016 0844   CL 107 01/21/2014 1743   CO2 29 07/16/2016 0844   CO2 25 01/21/2014 1743   BUN 10 07/16/2016 0844   BUN 14 01/21/2014 1743   CREATININE 0.88 07/16/2016 0844   CREATININE 0.91 01/21/2014 1743   GLUCOSE 122 (H) 07/16/2016 0844   GLUCOSE 103 (H) 01/21/2014 1743   GLUCOSE 117 07/16/2010 1526   CALCIUM 9.2 07/16/2016 0844   CALCIUM  9.1 01/21/2014 1743   Hepatic Function Latest Ref Rng & Units 07/16/2016 07/15/2015 03/27/2015  Total Protein 6.0 - 8.3 g/dL 7.5 8.3(H) 7.5  Albumin 3.5 - 5.2 g/dL 4.2 4.1 4.5  AST 0 - 37 U/L 24 30 37  ALT 0 - 53 U/L 24 26 34  Alk Phosphatase 39 - 117 U/L 46 31(L) 30(L)  Total Bilirubin 0.2 - 1.2 mg/dL 0.4 0.5 0.5  Bilirubin, Direct 0.0 - 0.3 mg/dL 0.1 - 0.1    Lab Results  Component Value Date   TSH 1.120 04/29/2016   Lab Results  Component Value Date   PSA 1.35 07/16/2016   PSA 0.19 03/27/2015   PSA 0.18 10/13/2013    Assessment and Plan:   Healthcare maintenance  Need for shingles vaccine - Plan: Varicella-zoster vaccine subcutaneous  Need for prophylactic vaccination with combined diphtheria-tetanus-pertussis (DTP) vaccine - Plan: Tdap vaccine greater than or equal to 7yo IM  Undertreated COPD - add advair Neuropathy, increase neurontin dosing  Health Maintenance Exam: The patient's preventative maintenance and recommended screening tests for an annual wellness exam were reviewed in full today. Brought up to date unless services declined.  Counselled on the importance of diet, exercise, and its role in overall health and mortality. The patient's FH and SH was reviewed, including their home life, tobacco status, and drug and alcohol status.  Follow-up in 1 year for physical exam or additional follow-up below.  Follow-up: No Follow-up on file. Or follow-up in 1 year if not noted.  Meds ordered this encounter  Medications  . Fluticasone-Salmeterol (ADVAIR DISKUS) 250-50 MCG/DOSE AEPB    Sig: Inhale 1 puff into the lungs 2 (two) times daily.    Dispense:  1 each    Refill:  11  . DISCONTD: gabapentin (NEURONTIN) 300 MG capsule    Sig: Take 1 capsule (300 mg total) by mouth 2 (two) times daily.    Dispense:  180 capsule    Refill:  3  . DISCONTD: gabapentin (NEURONTIN) 300 MG capsule    Sig: Take 1 capsule (300 mg total) by mouth 2 (two) times daily.    Dispense:   180 capsule    Refill:  3  . ipratropium-albuterol (DUONEB) 0.5-2.5 (3) MG/3ML SOLN    Sig: Take 3 mLs by nebulization every 4 (four) hours as needed.    Dispense:  630 mL    Refill:  5  . gabapentin (NEURONTIN) 300 MG capsule    Sig: Take 1 capsule (300 mg total) by mouth 2 (two) times  daily.    Dispense:  180 capsule    Refill:  3   Medications Discontinued During This Encounter  Medication Reason  . levofloxacin (LEVAQUIN) 500 MG tablet Completed Course  . predniSONE (DELTASONE) 20 MG tablet Completed Course  . gabapentin (NEURONTIN) 300 MG capsule Reorder  . gabapentin (NEURONTIN) 300 MG capsule Reorder  . ipratropium-albuterol (DUONEB) 0.5-2.5 (3) MG/3ML SOLN Reorder  . gabapentin (NEURONTIN) 300 MG capsule Reorder   Orders Placed This Encounter  Procedures  . Varicella-zoster vaccine subcutaneous  . Tdap vaccine greater than or equal to 7yo IM    Signed,  Corney Knighton T. Yee Joss, MD     Medication List       Accurate as of 07/22/16  2:24 PM. Always use your most recent med list.          carvedilol 3.125 MG tablet Commonly known as:  COREG TAKE 1 TABLET (3.125 MG TOTAL) BY MOUTH 2 (TWO) TIMES DAILY.   Fluticasone-Salmeterol 250-50 MCG/DOSE Aepb Commonly known as:  ADVAIR DISKUS Inhale 1 puff into the lungs 2 (two) times daily.   gabapentin 300 MG capsule Commonly known as:  NEURONTIN Take 1 capsule (300 mg total) by mouth 2 (two) times daily.   ipratropium-albuterol 0.5-2.5 (3) MG/3ML Soln Commonly known as:  DUONEB Take 3 mLs by nebulization every 4 (four) hours as needed.   metFORMIN 500 MG 24 hr tablet Commonly known as:  GLUCOPHAGE-XR TAKE 1 TABLET (500 MG TOTAL) BY MOUTH DAILY WITH BREAKFAST.   tiZANidine 4 MG tablet Commonly known as:  ZANAFLEX TAKE 1 TABLET (4 MG TOTAL) BY MOUTH NIGHTLY.

## 2016-07-22 NOTE — Progress Notes (Signed)
Pre visit review using our clinic review tool, if applicable. No additional management support is needed unless otherwise documented below in the visit note. 

## 2016-07-22 NOTE — Patient Instructions (Signed)
Vitamin D: 2,000 units a day

## 2016-07-29 ENCOUNTER — Ambulatory Visit (INDEPENDENT_AMBULATORY_CARE_PROVIDER_SITE_OTHER): Payer: 59 | Admitting: Primary Care

## 2016-07-29 ENCOUNTER — Inpatient Hospital Stay: Payer: 59 | Attending: Oncology

## 2016-07-29 ENCOUNTER — Encounter: Payer: Self-pay | Admitting: Primary Care

## 2016-07-29 ENCOUNTER — Other Ambulatory Visit: Payer: Self-pay

## 2016-07-29 VITALS — BP 158/100 | HR 104 | Temp 98.0°F | Ht 70.0 in | Wt 195.0 lb

## 2016-07-29 DIAGNOSIS — C9310 Chronic myelomonocytic leukemia not having achieved remission: Secondary | ICD-10-CM | POA: Diagnosis present

## 2016-07-29 DIAGNOSIS — R5383 Other fatigue: Secondary | ICD-10-CM | POA: Diagnosis not present

## 2016-07-29 DIAGNOSIS — R911 Solitary pulmonary nodule: Secondary | ICD-10-CM | POA: Insufficient documentation

## 2016-07-29 DIAGNOSIS — E785 Hyperlipidemia, unspecified: Secondary | ICD-10-CM | POA: Diagnosis not present

## 2016-07-29 DIAGNOSIS — I252 Old myocardial infarction: Secondary | ICD-10-CM | POA: Diagnosis not present

## 2016-07-29 DIAGNOSIS — D696 Thrombocytopenia, unspecified: Secondary | ICD-10-CM | POA: Insufficient documentation

## 2016-07-29 DIAGNOSIS — E119 Type 2 diabetes mellitus without complications: Secondary | ICD-10-CM | POA: Insufficient documentation

## 2016-07-29 DIAGNOSIS — R05 Cough: Secondary | ICD-10-CM | POA: Diagnosis not present

## 2016-07-29 DIAGNOSIS — F17210 Nicotine dependence, cigarettes, uncomplicated: Secondary | ICD-10-CM | POA: Diagnosis not present

## 2016-07-29 DIAGNOSIS — I2510 Atherosclerotic heart disease of native coronary artery without angina pectoris: Secondary | ICD-10-CM | POA: Insufficient documentation

## 2016-07-29 DIAGNOSIS — K219 Gastro-esophageal reflux disease without esophagitis: Secondary | ICD-10-CM | POA: Insufficient documentation

## 2016-07-29 DIAGNOSIS — Z79899 Other long term (current) drug therapy: Secondary | ICD-10-CM | POA: Insufficient documentation

## 2016-07-29 DIAGNOSIS — I10 Essential (primary) hypertension: Secondary | ICD-10-CM | POA: Insufficient documentation

## 2016-07-29 DIAGNOSIS — J449 Chronic obstructive pulmonary disease, unspecified: Secondary | ICD-10-CM

## 2016-07-29 DIAGNOSIS — D709 Neutropenia, unspecified: Secondary | ICD-10-CM | POA: Insufficient documentation

## 2016-07-29 DIAGNOSIS — R059 Cough, unspecified: Secondary | ICD-10-CM

## 2016-07-29 DIAGNOSIS — Z8673 Personal history of transient ischemic attack (TIA), and cerebral infarction without residual deficits: Secondary | ICD-10-CM | POA: Insufficient documentation

## 2016-07-29 LAB — CBC WITH DIFFERENTIAL/PLATELET
BASOS ABS: 0 10*3/uL (ref 0–0.1)
BASOS PCT: 1 %
EOS PCT: 0 %
Eosinophils Absolute: 0 10*3/uL (ref 0–0.7)
HCT: 36.8 % — ABNORMAL LOW (ref 40.0–52.0)
Hemoglobin: 12.6 g/dL — ABNORMAL LOW (ref 13.0–18.0)
LYMPHS PCT: 27 %
Lymphs Abs: 0.6 10*3/uL — ABNORMAL LOW (ref 1.0–3.6)
MCH: 32.7 pg (ref 26.0–34.0)
MCHC: 34.3 g/dL (ref 32.0–36.0)
MCV: 95.3 fL (ref 80.0–100.0)
MONO ABS: 0.5 10*3/uL (ref 0.2–1.0)
Monocytes Relative: 22 %
NEUTROS ABS: 1.2 10*3/uL — AB (ref 1.4–6.5)
Neutrophils Relative %: 50 %
PLATELETS: 174 10*3/uL (ref 150–440)
RBC: 3.86 MIL/uL — AB (ref 4.40–5.90)
RDW: 15 % — AB (ref 11.5–14.5)
WBC: 2.4 10*3/uL — AB (ref 3.8–10.6)

## 2016-07-29 MED ORDER — BUDESONIDE-FORMOTEROL FUMARATE 160-4.5 MCG/ACT IN AERO
2.0000 | INHALATION_SPRAY | Freq: Two times a day (BID) | RESPIRATORY_TRACT | 1 refills | Status: DC
Start: 2016-07-29 — End: 2016-09-21

## 2016-07-29 MED ORDER — PREDNISONE 20 MG PO TABS: ORAL_TABLET | ORAL | 0 refills | Status: DC

## 2016-07-29 MED ORDER — HYDROCODONE-HOMATROPINE 5-1.5 MG/5ML PO SYRP: 5.0000 mL | ORAL_SOLUTION | Freq: Every evening | ORAL | 0 refills | Status: DC | PRN

## 2016-07-29 NOTE — Patient Instructions (Signed)
Start Prednisone tablets. Take 2 tablets daily for 5 days.  I sent a prescription for Symbicort to your pharmacy. Inhale 2 puffs into the lungs twice daily. Please call me if this medication is too expensive.  Continue your nebulized treatments as needed.  You may take the Hycodan cough suppressant at bedtime as needed for cough and rest. Caution this medication contains codeine and will make you feel drowsy.  Please notify me if you develop persistent fevers of 101, start coughing up green mucous, notice increased fatigue or weakness, or feel worse after 1 week of onset of symptoms.   Increase consumption of water intake and rest.  It was a pleasure meeting you!

## 2016-07-29 NOTE — Progress Notes (Signed)
Pre visit review using our clinic review tool, if applicable. No additional management support is needed unless otherwise documented below in the visit note. 

## 2016-07-29 NOTE — Progress Notes (Signed)
Subjective:    Patient ID: BLONG PIKE, male    DOB: 1950/10/15, 65 y.o.   MRN: [id number]  HPI  Tony Watson is a 65 year old male with a history of COPD, hypertension, allergic rhinitis who presents today with a chief complaint of cough. He also reports sore throat, choking on liquids, chest tightness. His symptoms began 5 days ago. He was treated 4 weeks ago with prednisone and antibiotics with improvement until Friday last week. He denies fevers, nausea, wheezing. His cough is non productive. He's using his Duoneb with temporary improvement, he could not afford the Advair inhaler despite the coupon that was provided. Overall he's feeling well except for the cough.  Review of Systems  Constitutional: Negative for fatigue and fever.  HENT: Positive for congestion, postnasal drip and sore throat.   Respiratory: Positive for cough and chest tightness.   Cardiovascular: Negative for chest pain.       Past Medical History:  Diagnosis Date  . CAD (coronary artery disease)    inferior MI 11/99 (in [place]) with PCI to [facility]. last [place] 2004 [facility]): EF 50%, patent RCA stent. no obstructive disease. last myoview 2008: EF 42%, inferior infarct, no ischemia.   Marland Kitchen COPD (chronic obstructive pulmonary disease) ([place])   . Diabetic autonomic neuropathy associated with type 2 diabetes mellitus ([place]) 03/27/2015  . Diverticulosis 2002  . DM2 (diabetes mellitus, type 2) ([facility])    A1c 6.0% 12/11  . GERD (gastroesophageal reflux disease)   . HTN (hypertension)   . Hyperlipidemia   . Ischemic cardiomyopathy    mild with EF 42% on myoviews 2008.  . Medical non-compliance   . Myocardial infarction   . Nicotine dependence    chronic, active  . Seasonal allergic rhinitis   . Smoker   . Systolic heart failure secondary to coronary artery disease ([place]) 05/30/2012  . TIA (transient ischemic attack)    hx; now s/p PFO closure 2004 (in [place])  . Ulcer [facility])      Social History   Social History  . Marital  status: Married    Spouse name: N/A  . Number of children: N/A  . Years of education: N/A   Occupational History  . Not on file.   Social History Main Topics  . Smoking status: Current Every Day Smoker    Packs/day: 1.00    Years: 45.00    Types: Cigarettes  . Smokeless tobacco: Never Used     Comment: 1 ppd +40 years  . Alcohol use 0.0 oz/week     Comment: beer weekly  . Drug use: No  . Sexual activity: Not on file   Other Topics Concern  . Not on file   Social History Narrative   Married, 1 daughter. 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 Lives in [place], [place]    Past Surgical History:  Procedure Laterality Date  . CARDIAC CATHETERIZATION  11/99   CI per PMH  . open heart surgery  2004   PFO repair    Family History  Problem Relation Age of Onset  . Coronary artery disease      family hx  . Breast cancer      1st egree relative <50    No Known Allergies  Current Outpatient Prescriptions on File Prior to Visit  Medication Sig Dispense Refill  . carvedilol (COREG) 3.125 MG tablet TAKE 1 TABLET (3.125 MG TOTAL) BY MOUTH 2 (TWO) TIMES DAILY. 180 tablet 0  . gabapentin (  NEURONTIN) 300 MG capsule Take 1 capsule (300 mg total) by mouth 2 (two) times daily. 180 capsule 3  . ipratropium-albuterol (DUONEB) 0.5-2.5 (3) MG/3ML SOLN Take 3 mLs by nebulization every 4 (four) hours as needed. 630 mL 5  . metFORMIN (GLUCOPHAGE-XR) 500 MG 24 hr tablet TAKE 1 TABLET (500 MG TOTAL) BY MOUTH DAILY WITH BREAKFAST. 90 tablet 0  . tiZANidine (ZANAFLEX) 4 MG tablet TAKE 1 TABLET (4 MG TOTAL) BY MOUTH NIGHTLY. 90 tablet 3   No current facility-administered medications on file prior to visit.     BP (!) 158/100   Pulse (!) 104   Temp 98 F (36.7 C) (Oral)   Ht 5\' 10"  (1.778 m)   Wt 195 lb (88.5 kg)   SpO2 97%   BMI 27.98 kg/m    Objective:   Physical Exam  Constitutional: He appears well-nourished. He does not appear ill.  HENT:  Right Ear: Tympanic  membrane and ear canal normal.  Left Ear: Tympanic membrane and ear canal normal.  Nose: No mucosal edema. Right sinus exhibits no maxillary sinus tenderness and no frontal sinus tenderness. Left sinus exhibits no maxillary sinus tenderness and no frontal sinus tenderness.  Mouth/Throat: Oropharynx is clear and moist.  Eyes: Conjunctivae are normal.  Neck: Neck supple.  Cardiovascular: Normal rate and regular rhythm.   Pulmonary/Chest: Effort normal. He has no wheezes. He has no rales.  Persistent, dry, cough. Tight airway  Skin: Skin is warm and dry.          Assessment & Plan:  URI vs COPD vs Bronchitis:  Cough, congestion, non productive cough, x 4 days. No fevers, feeling okay, persistent cough. Could not afford Advair, even with coupon. Exam today without evidence for pneumonia or bacterial involvement. Without the evidence of increased sputum production, based off examination, and given recent antibiotic use, unlikely cause to be bacterial. Rx for Symbicort sent to pharmacy. Discussed to call if unable to afford. Suspect this will help with cough. Rx for Hycodan and Prednisone burst provided. Discussed Mucinex, fluids, rest.  Strict return precautions provided.  Sheral Flow, NP

## 2016-08-17 ENCOUNTER — Other Ambulatory Visit: Payer: Self-pay | Admitting: Family Medicine

## 2016-09-12 ENCOUNTER — Other Ambulatory Visit: Payer: Self-pay | Admitting: Family Medicine

## 2016-09-21 ENCOUNTER — Other Ambulatory Visit: Payer: Self-pay | Admitting: Primary Care

## 2016-09-21 DIAGNOSIS — J449 Chronic obstructive pulmonary disease, unspecified: Secondary | ICD-10-CM

## 2016-10-25 NOTE — Progress Notes (Signed)
[facility]  [phone] [phone]  ID: WINN MUEHL OB: 05-18-1951  MR#: [id number]  BII#:[id number]  Patient Care Team: Hannah Beat, MD as PCP - General  CHIEF COMPLAINT: Bone marrow biopsy proven CMML-1.  INTERVAL HISTORY: Patient returns to clinic today for repeat laboratory work and further evaluation. Patient states he has had intermittent sore throat and dysphagia over the past several months. His symptoms have remained unchanged despite 2 courses of antibiotics prescribed by his primary care physician. More recently patient has noted a more raspy voice. He also has had unintentional weight loss, but is unclear how much, over the same time frame. He otherwise feels well. He denies any fevers, chills, or night sweats. He has no neurologic complaints. He has no chest pain or shortness of breath. He denies any nausea, vomiting, constipation, or diarrhea. He has no urinary complaints. Patient offers no further specific complaints today.  REVIEW OF SYSTEMS:   Review of Systems  Constitutional: Positive for malaise/fatigue. Negative for diaphoresis, fever and weight loss.  HENT: Positive for sore throat.   Respiratory: Negative for cough and shortness of breath.   Cardiovascular: Negative.  Negative for chest pain and leg swelling.  Gastrointestinal: Negative.  Negative for abdominal pain.  Genitourinary: Negative.   Musculoskeletal: Negative.   Neurological: Positive for weakness.  Endo/Heme/Allergies: Does not bruise/bleed easily.  Psychiatric/Behavioral: Negative.  The patient is not nervous/anxious.     As per HPI. Otherwise, a complete review of systems is negative.  PAST MEDICAL HISTORY: Past Medical History:  Diagnosis Date  . CAD (coronary artery disease)    inferior MI 11/99 (in [place]) with PCI to [facility]. last LHC 2004 [facility]): EF 50%, patent RCA stent. no obstructive disease. last myoview 2008: EF 42%, inferior infarct, no  ischemia.   Marland Kitchen COPD (chronic obstructive pulmonary disease) (HCC)   . Diabetic autonomic neuropathy associated with type 2 diabetes mellitus (HCC) 03/27/2015  . Diverticulosis 2002  . DM2 (diabetes mellitus, type 2) (HCC)    A1c 6.0% 12/11  . GERD (gastroesophageal reflux disease)   . HTN (hypertension)   . Hyperlipidemia   . Ischemic cardiomyopathy    mild with EF 42% on myoviews 2008.  . Medical non-compliance   . Myocardial infarction   . Nicotine dependence    chronic, active  . Seasonal allergic rhinitis   . Smoker   . Systolic heart failure secondary to coronary artery disease (HCC) 05/30/2012  . TIA (transient ischemic attack)    hx; now s/p PFO closure 2004 (in [place])  . Ulcer (HCC)     PAST SURGICAL HISTORY: Past Surgical History:  Procedure Laterality Date  . CARDIAC CATHETERIZATION  11/99   CI per PMH  . open heart surgery  2004   PFO repair    FAMILY HISTORY Family History  Problem Relation Age of Onset  . Coronary artery disease      family hx  . Breast cancer      1st egree relative <50       ADVANCED DIRECTIVES:    HEALTH MAINTENANCE: Social History  Substance Use Topics  . Smoking status: Current Every Day Smoker    Packs/day: 1.00    Years: 45.00    Types: Cigarettes  . Smokeless tobacco: Never Used     Comment: 1 ppd +40 years  . Alcohol use 0.0 oz/week     Comment: beer weekly     Colonoscopy:  PAP:  Bone density:  Lipid panel:  No  Known Allergies  Current Outpatient Prescriptions  Medication Sig Dispense Refill  . carvedilol (COREG) 3.125 MG tablet TAKE 1 TABLET (3.125 MG TOTAL) BY MOUTH 2 (TWO) TIMES DAILY. 180 tablet 1  . gabapentin (NEURONTIN) 300 MG capsule Take 1 capsule (300 mg total) by mouth 2 (two) times daily. 180 capsule 3  . ipratropium-albuterol (DUONEB) 0.5-2.5 (3) MG/3ML SOLN USE 1 VIAL VIA NEBULIZER EVERY 4 HOURS AS NEEDED 540 mL 5  . metFORMIN (GLUCOPHAGE-XR) 500 MG 24 hr tablet TAKE 1 TABLET (500 MG TOTAL) BY  MOUTH DAILY WITH BREAKFAST. 90 tablet 1  . SYMBICORT 160-4.5 MCG/ACT inhaler INHALE 2 PUFFS INTO THE LUNGS 2 (TWO) TIMES DAILY. 10.2 Inhaler 4  . tiZANidine (ZANAFLEX) 4 MG tablet TAKE 1 TABLET (4 MG TOTAL) BY MOUTH NIGHTLY. 90 tablet 3   No current facility-administered medications for this visit.     OBJECTIVE: Vitals:   10/27/16 1434  BP: (!) 150/87  Pulse: 98  Resp: 18  Temp: 98.7 F (37.1 C)     Body mass index is 26.37 kg/m.    ECOG FS:0 - Asymptomatic  General: Well-developed, well-nourished, no acute distress. Eyes: Pink conjunctiva, anicteric sclera. HEENT: Scant lymphadenopathy on left neck, erythema noted in oropharynx. Lungs: Clear to auscultation bilaterally. Heart: Regular rate and rhythm. No rubs, murmurs, or gallops. Abdomen: Soft, nontender, nondistended. No organomegaly noted, normoactive bowel sounds. Musculoskeletal: No edema, cyanosis, or clubbing. Neuro: Alert, answering all questions appropriately. Cranial nerves grossly intact. Skin: No rashes or petechiae noted. Psych: Normal affect.   LAB RESULTS:  Lab Results  Component Value Date   NA 140 07/16/2016   K 4.8 07/16/2016   CL 103 07/16/2016   CO2 29 07/16/2016   GLUCOSE 122 (H) 07/16/2016   BUN 10 07/16/2016   CREATININE 0.88 07/16/2016   CALCIUM 9.2 07/16/2016   PROT 7.5 07/16/2016   ALBUMIN 4.2 07/16/2016   AST 24 07/16/2016   ALT 24 07/16/2016   ALKPHOS 46 07/16/2016   BILITOT 0.4 07/16/2016   GFRNONAA >60 07/15/2015   GFRAA >60 07/15/2015    Lab Results  Component Value Date   WBC 7.8 10/27/2016   NEUTROABS 1.8 10/27/2016   HGB 11.9 (L) 10/27/2016   HCT 33.8 (L) 10/27/2016   MCV 93.2 10/27/2016   PLT 179 10/27/2016     STUDIES: No results found.  ASSESSMENT: Bone marrow biopsy proven CMML-1.  PLAN:    1. CMML: Bone marrow biopsy completed on August 30, 2015 confirmed the diagnosis.  Patient's absolute monocytosis has been greater than 1.0 and persistent since at least  July 2014. Today's result is more elevated at 3.9.  The remainder of his blood work including BCR-ABL and ANA are negative or within normal limits. Previously, hepatitis C and HIV were also negative.  He will likely need treatment with azacitidine or decitabine at some point, but not at this moment. He has no evidence of end organ involvement. Return to clinic in 3 months for laboratory work and then in 6 months for laboratory work and further evaluation. 2. Thrombocytopenia: Resolved. Bone marrow biopsy as above. 3. Neutropenia: Also likely secondary to CML, monitor. Resolved. 4. Pulmonary nodule: Repeat CT scan results from April 27, 2016 reviewed independently with resolution of pulmonary nodules. No further imaging is necessary. 5. Sore throat/weight loss: Unclear etiology. But given patient's extensive smoking history have referred to ENT for further evaluation.  Approximately 30 minutes was spent in discussion of which greater than 50% was consultation.  Patient expressed  understanding and was in agreement with this plan. He also understands that He can call clinic at any time with any questions, concerns, or complaints.     Lloyd Huger, MD   10/27/2016 6:30 PM

## 2016-10-27 ENCOUNTER — Inpatient Hospital Stay (HOSPITAL_BASED_OUTPATIENT_CLINIC_OR_DEPARTMENT_OTHER): Payer: 59

## 2016-10-27 ENCOUNTER — Inpatient Hospital Stay: Payer: 59 | Attending: Oncology | Admitting: Oncology

## 2016-10-27 ENCOUNTER — Encounter: Payer: Self-pay | Admitting: Oncology

## 2016-10-27 VITALS — BP 150/87 | HR 98 | Temp 98.7°F | Resp 18 | Ht 70.0 in | Wt 183.8 lb

## 2016-10-27 DIAGNOSIS — Z79899 Other long term (current) drug therapy: Secondary | ICD-10-CM | POA: Diagnosis not present

## 2016-10-27 DIAGNOSIS — Z8673 Personal history of transient ischemic attack (TIA), and cerebral infarction without residual deficits: Secondary | ICD-10-CM | POA: Diagnosis not present

## 2016-10-27 DIAGNOSIS — C9310 Chronic myelomonocytic leukemia not having achieved remission: Secondary | ICD-10-CM | POA: Insufficient documentation

## 2016-10-27 DIAGNOSIS — F17210 Nicotine dependence, cigarettes, uncomplicated: Secondary | ICD-10-CM | POA: Diagnosis not present

## 2016-10-27 DIAGNOSIS — E785 Hyperlipidemia, unspecified: Secondary | ICD-10-CM | POA: Diagnosis not present

## 2016-10-27 DIAGNOSIS — J449 Chronic obstructive pulmonary disease, unspecified: Secondary | ICD-10-CM | POA: Insufficient documentation

## 2016-10-27 DIAGNOSIS — I2510 Atherosclerotic heart disease of native coronary artery without angina pectoris: Secondary | ICD-10-CM | POA: Insufficient documentation

## 2016-10-27 DIAGNOSIS — Z7984 Long term (current) use of oral hypoglycemic drugs: Secondary | ICD-10-CM | POA: Diagnosis not present

## 2016-10-27 DIAGNOSIS — I252 Old myocardial infarction: Secondary | ICD-10-CM | POA: Insufficient documentation

## 2016-10-27 DIAGNOSIS — Z955 Presence of coronary angioplasty implant and graft: Secondary | ICD-10-CM | POA: Diagnosis not present

## 2016-10-27 DIAGNOSIS — K219 Gastro-esophageal reflux disease without esophagitis: Secondary | ICD-10-CM | POA: Diagnosis not present

## 2016-10-27 DIAGNOSIS — E119 Type 2 diabetes mellitus without complications: Secondary | ICD-10-CM | POA: Diagnosis not present

## 2016-10-27 DIAGNOSIS — J029 Acute pharyngitis, unspecified: Secondary | ICD-10-CM | POA: Insufficient documentation

## 2016-10-27 DIAGNOSIS — I255 Ischemic cardiomyopathy: Secondary | ICD-10-CM | POA: Diagnosis not present

## 2016-10-27 DIAGNOSIS — R634 Abnormal weight loss: Secondary | ICD-10-CM | POA: Insufficient documentation

## 2016-10-27 DIAGNOSIS — I110 Hypertensive heart disease with heart failure: Secondary | ICD-10-CM | POA: Diagnosis not present

## 2016-10-27 LAB — CBC WITH DIFFERENTIAL/PLATELET
Basophils Absolute: 0 10*3/uL (ref 0–0.1)
Basophils Relative: 0 %
EOS ABS: 0 10*3/uL (ref 0–0.7)
EOS PCT: 0 %
HEMATOCRIT: 33.8 % — AB (ref 40.0–52.0)
Hemoglobin: 11.9 g/dL — ABNORMAL LOW (ref 13.0–18.0)
LYMPHS ABS: 2.1 10*3/uL (ref 1.0–3.6)
Lymphocytes Relative: 27 %
MCH: 32.8 pg (ref 26.0–34.0)
MCHC: 35.2 g/dL (ref 32.0–36.0)
MCV: 93.2 fL (ref 80.0–100.0)
MONO ABS: 3.9 10*3/uL — AB (ref 0.2–1.0)
Monocytes Relative: 50 %
NEUTROS PCT: 23 %
Neutro Abs: 1.8 10*3/uL (ref 1.4–6.5)
PLATELETS: 179 10*3/uL (ref 150–440)
RBC: 3.63 MIL/uL — AB (ref 4.40–5.90)
RDW: 15.6 % — AB (ref 11.5–14.5)
Smear Review: ADEQUATE
WBC: 7.8 10*3/uL (ref 3.8–10.6)

## 2016-10-27 NOTE — Progress Notes (Signed)
Pt concerned about his lack of appetite, for about 4 month he has on and off throat scratchy and then better but loses his voice, and having fatigue. Lost of wt

## 2016-10-28 ENCOUNTER — Other Ambulatory Visit: Payer: Self-pay | Admitting: Family Medicine

## 2016-11-18 ENCOUNTER — Encounter: Payer: Self-pay | Admitting: *Deleted

## 2016-11-24 ENCOUNTER — Ambulatory Visit: Payer: 59 | Admitting: Registered Nurse

## 2016-11-24 ENCOUNTER — Encounter: Payer: Self-pay | Admitting: *Deleted

## 2016-11-24 ENCOUNTER — Encounter
Admission: RE | Disposition: A | Discharge status: Home / Self Care | Payer: Self-pay | Source: Ambulatory Visit | Attending: Ophthalmology

## 2016-11-24 ENCOUNTER — Ambulatory Visit
Admission: RE | Admit: 2016-11-24 | Discharge: 2016-11-24 | Disposition: A | Discharge status: Home / Self Care | Payer: 59 | Source: Ambulatory Visit | Attending: Ophthalmology | Admitting: Ophthalmology

## 2016-11-24 DIAGNOSIS — Z955 Presence of coronary angioplasty implant and graft: Secondary | ICD-10-CM | POA: Insufficient documentation

## 2016-11-24 DIAGNOSIS — I252 Old myocardial infarction: Secondary | ICD-10-CM | POA: Insufficient documentation

## 2016-11-24 DIAGNOSIS — J449 Chronic obstructive pulmonary disease, unspecified: Secondary | ICD-10-CM | POA: Insufficient documentation

## 2016-11-24 DIAGNOSIS — F17200 Nicotine dependence, unspecified, uncomplicated: Secondary | ICD-10-CM | POA: Insufficient documentation

## 2016-11-24 DIAGNOSIS — E1151 Type 2 diabetes mellitus with diabetic peripheral angiopathy without gangrene: Secondary | ICD-10-CM | POA: Insufficient documentation

## 2016-11-24 DIAGNOSIS — I10 Essential (primary) hypertension: Secondary | ICD-10-CM | POA: Diagnosis not present

## 2016-11-24 DIAGNOSIS — Z7984 Long term (current) use of oral hypoglycemic drugs: Secondary | ICD-10-CM | POA: Diagnosis not present

## 2016-11-24 DIAGNOSIS — H2511 Age-related nuclear cataract, right eye: Secondary | ICD-10-CM | POA: Insufficient documentation

## 2016-11-24 DIAGNOSIS — Z79899 Other long term (current) drug therapy: Secondary | ICD-10-CM | POA: Diagnosis not present

## 2016-11-24 DIAGNOSIS — Z7951 Long term (current) use of inhaled steroids: Secondary | ICD-10-CM | POA: Diagnosis not present

## 2016-11-24 DIAGNOSIS — K219 Gastro-esophageal reflux disease without esophagitis: Secondary | ICD-10-CM | POA: Diagnosis not present

## 2016-11-24 DIAGNOSIS — Z951 Presence of aortocoronary bypass graft: Secondary | ICD-10-CM | POA: Diagnosis not present

## 2016-11-24 DIAGNOSIS — I2510 Atherosclerotic heart disease of native coronary artery without angina pectoris: Secondary | ICD-10-CM | POA: Insufficient documentation

## 2016-11-24 HISTORY — DX: Spinal stenosis, site unspecified: M48.00

## 2016-11-24 HISTORY — PX: CATARACT EXTRACTION W/PHACO: SHX586

## 2016-11-24 LAB — GLUCOSE, CAPILLARY: GLUCOSE-CAPILLARY: 107 mg/dL — AB (ref 65–99)

## 2016-11-24 SURGERY — PHACOEMULSIFICATION, CATARACT, WITH IOL INSERTION
Anesthesia: Monitor Anesthesia Care | Site: Eye | Laterality: Right | Wound class: Clean

## 2016-11-24 MED ORDER — CARBACHOL 0.01 % IO SOLN
INTRAOCULAR | Status: DC | PRN
  Administered 2016-11-24: 0.5 mL via INTRAOCULAR

## 2016-11-24 MED ORDER — EPINEPHRINE PF 1 MG/ML IJ SOLN
INTRAMUSCULAR | Status: AC
  Filled 2016-11-24: qty 2

## 2016-11-24 MED ORDER — MOXIFLOXACIN HCL 0.5 % OP SOLN
OPHTHALMIC | Status: DC | PRN
  Administered 2016-11-24: 0.2 mL via OPHTHALMIC

## 2016-11-24 MED ORDER — MOXIFLOXACIN HCL 0.5 % OP SOLN
OPHTHALMIC | Status: AC
  Filled 2016-11-24: qty 3

## 2016-11-24 MED ORDER — ARMC OPHTHALMIC DILATING DROPS
1.0000 "application " | OPHTHALMIC | Status: AC
  Administered 2016-11-24 (×2): 1 via OPHTHALMIC

## 2016-11-24 MED ORDER — LIDOCAINE HCL (PF) 4 % IJ SOLN
INTRAMUSCULAR | Status: DC | PRN
  Administered 2016-11-24: 4 mL via OPHTHALMIC

## 2016-11-24 MED ORDER — NA CHONDROIT SULF-NA HYALURON 40-17 MG/ML IO SOLN
INTRAOCULAR | Status: AC
  Filled 2016-11-24: qty 1

## 2016-11-24 MED ORDER — NA CHONDROIT SULF-NA HYALURON 40-17 MG/ML IO SOLN
INTRAOCULAR | Status: DC | PRN
  Administered 2016-11-24: 1 mL via INTRAOCULAR

## 2016-11-24 MED ORDER — POVIDONE-IODINE 5 % OP SOLN
OPHTHALMIC | Status: DC | PRN
  Administered 2016-11-24: 1 via OPHTHALMIC

## 2016-11-24 MED ORDER — MOXIFLOXACIN HCL 0.5 % OP SOLN: 1.0000 [drp] | OPHTHALMIC | Status: DC | PRN

## 2016-11-24 MED ORDER — SODIUM CHLORIDE 0.9 % IV SOLN
INTRAVENOUS | Status: DC
Start: 2016-11-24 — End: 2016-11-24
  Administered 2016-11-24: 07:00:00 via INTRAVENOUS

## 2016-11-24 MED ORDER — POVIDONE-IODINE 10 % OINT PACKET
TOPICAL_OINTMENT | CUTANEOUS | Status: AC
  Filled 2016-11-24: qty 1

## 2016-11-24 MED ORDER — FENTANYL CITRATE (PF) 100 MCG/2ML IJ SOLN
INTRAMUSCULAR | Status: AC
  Filled 2016-11-24: qty 2

## 2016-11-24 MED ORDER — FENTANYL CITRATE (PF) 100 MCG/2ML IJ SOLN
INTRAMUSCULAR | Status: DC | PRN
  Administered 2016-11-24: 50 ug via INTRAVENOUS

## 2016-11-24 MED ORDER — EPINEPHRINE PF 1 MG/ML IJ SOLN
INTRAMUSCULAR | Status: DC | PRN
  Administered 2016-11-24: 08:00:00 via OPHTHALMIC

## 2016-11-24 MED ORDER — ARMC OPHTHALMIC DILATING DROPS
OPHTHALMIC | Status: AC
  Administered 2016-11-24: 08:00:00
  Filled 2016-11-24: qty 0.4

## 2016-11-24 MED ORDER — MIDAZOLAM HCL 2 MG/2ML IJ SOLN
INTRAMUSCULAR | Status: AC
  Filled 2016-11-24: qty 2

## 2016-11-24 MED ORDER — MIDAZOLAM HCL 2 MG/2ML IJ SOLN
INTRAMUSCULAR | Status: DC | PRN
  Administered 2016-11-24: 1 mg via INTRAVENOUS

## 2016-11-24 SURGICAL SUPPLY — 21 items
CANNULA ANT/CHMB 27GA (MISCELLANEOUS) ×3 IMPLANT
CUP MEDICINE 2OZ PLAST GRAD ST (MISCELLANEOUS) ×3 IMPLANT
GLOVE BIO SURGEON STRL SZ8 (GLOVE) ×3 IMPLANT
GLOVE BIOGEL M 6.5 STRL (GLOVE) ×3 IMPLANT
GLOVE SURG LX 8.0 MICRO (GLOVE) ×2
GLOVE SURG LX STRL 8.0 MICRO (GLOVE) ×1 IMPLANT
GOWN STRL REUS W/ TWL LRG LVL3 (GOWN DISPOSABLE) ×2 IMPLANT
GOWN STRL REUS W/TWL LRG LVL3 (GOWN DISPOSABLE) ×4
LENS IOL TECNIS ITEC 17.5 (Intraocular Lens) ×3 IMPLANT
PACK CATARACT (MISCELLANEOUS) ×3 IMPLANT
PACK CATARACT BRASINGTON LX (MISCELLANEOUS) ×3 IMPLANT
PACK EYE AFTER SURG (MISCELLANEOUS) ×3 IMPLANT
SOL BSS BAG (MISCELLANEOUS) ×3
SOL PREP PVP 2OZ (MISCELLANEOUS) ×3
SOLUTION BSS BAG (MISCELLANEOUS) ×1 IMPLANT
SOLUTION PREP PVP 2OZ (MISCELLANEOUS) ×1 IMPLANT
SYR 3ML LL SCALE MARK (SYRINGE) ×3 IMPLANT
SYR 5ML LL (SYRINGE) ×3 IMPLANT
SYR TB 1ML 27GX1/2 LL (SYRINGE) ×3 IMPLANT
WATER STERILE IRR 250ML POUR (IV SOLUTION) ×3 IMPLANT
WIPE NON LINTING 3.25X3.25 (MISCELLANEOUS) ×3 IMPLANT

## 2016-11-24 NOTE — Anesthesia Preprocedure Evaluation (Signed)
Anesthesia Evaluation  Patient identified by MRN, date of birth, ID band Patient awake    Reviewed: Allergy & Precautions, NPO status , Patient's Chart, lab work & pertinent test results  Airway Mallampati: III       Dental  (+) Upper Dentures, Lower Dentures   Pulmonary shortness of breath, COPD, Current Smoker,     + decreased breath sounds      Cardiovascular Exercise Tolerance: Poor hypertension, Pt. on medications + CAD, + Past MI and + Peripheral Vascular Disease   Rhythm:Regular     Neuro/Psych TIA   GI/Hepatic Neg liver ROS, GERD  Medicated,  Endo/Other  diabetes, Type 2, Oral Hypoglycemic Agents  Renal/GU negative Renal ROS     Musculoskeletal   Abdominal Normal abdominal exam  (+)   Peds negative pediatric ROS (+)  Hematology   Anesthesia Other Findings   Reproductive/Obstetrics                             Anesthesia Physical Anesthesia Plan  ASA: III  Anesthesia Plan: MAC   Post-op Pain Management:    Induction: Intravenous  Airway Management Planned: Natural Airway and Nasal Cannula  Additional Equipment:   Intra-op Plan:   Post-operative Plan:   Informed Consent: I have reviewed the patients History and Physical, chart, labs and discussed the procedure including the risks, benefits and alternatives for the proposed anesthesia with the patient or authorized representative who has indicated his/her understanding and acceptance.     Plan Discussed with: CRNA  Anesthesia Plan Comments:         Anesthesia Quick Evaluation

## 2016-11-24 NOTE — Transfer of Care (Signed)
Immediate Anesthesia Transfer of Care Note  Patient: Tony Watson  Procedure(s) Performed: Procedure(s) with comments: CATARACT EXTRACTION PHACO AND INTRAOCULAR LENS PLACEMENT (IOC) (Right) - [place] 1:04.3 AP% 22.3 CDE 14.35 Fluid pack lot # [id number]  Patient Location: PACU  Anesthesia Type:MAC  Level of Consciousness: awake, alert  and oriented  Airway & Oxygen Therapy: Patient Spontanous Breathing  Post-op Assessment: Report given to RN and Post -op Vital signs reviewed and stable  Post vital signs: Reviewed and stable  Last Vitals:  Vitals:   11/24/16 0714 11/24/16 0825  BP: 121/60 120/67  Pulse: 87 86  Resp: 16 15  Temp:  36.3 C    Last Pain:  Vitals:   11/24/16 0825  TempSrc: Tympanic         Complications: No apparent anesthesia complications

## 2016-11-24 NOTE — H&P (Signed)
All labs reviewed. Abnormal studies sent to patients PCP when indicated.  Previous H&P reviewed, patient examined, there are NO CHANGES.  Tony Watson LOUIS4/17/20188:00 AM

## 2016-11-24 NOTE — Anesthesia Post-op Follow-up Note (Cosign Needed)
Anesthesia QCDR form completed.        

## 2016-11-24 NOTE — Anesthesia Procedure Notes (Signed)
Procedure Name: MAC Date/Time: 11/24/2016 8:09 AM Performed by: Hedda Slade Pre-anesthesia Checklist: Patient identified, Emergency Drugs available, Suction available and Patient being monitored Patient Re-evaluated:Patient Re-evaluated prior to inductionOxygen Delivery Method: Nasal cannula

## 2016-11-24 NOTE — OR Nursing (Signed)
Dr Micah Flesher in to see pt, aware re carvedilol bid and pt's last dose last night 8 pm, aware of pt's HR and that he didn't take carvedilol this am, advises ok.

## 2016-11-24 NOTE — Discharge Instructions (Signed)
Eye Surgery Discharge Instructions  Expect mild scratchy sensation or mild soreness. DO NOT RUB YOUR EYE!  The day of surgery:  Minimal physical activity, but bed rest is not required  No reading, computer work, or close hand work  No bending, lifting, or straining.  May watch TV  For 24 hours:  No driving, legal decisions, or alcoholic beverages  Safety precautions  Eat anything you prefer: It is better to start with liquids, then soup then solid foods.  _____ Eye patch should be worn until postoperative exam tomorrow.  ____ Solar shield eyeglasses should be worn for comfort in the sunlight/patch while sleeping  Resume all regular medications including aspirin or Coumadin if these were discontinued prior to surgery. You may shower, bathe, shave, or wash your hair. Tylenol may be taken for mild discomfort.  Call your doctor if you experience significant pain, nausea, or vomiting, fever > 101 or other signs of infection. [phone] or [phone] Specific instructions:  Follow-up Information    PORFILIO,WILLIAM LOUIS, MD Follow up.   Specialty:  Ophthalmology Why:  11-25-16 at 8:05 Contact information: [address] [place] [address] [phone]          Eye Surgery Discharge Instructions  Expect mild scratchy sensation or mild soreness. DO NOT RUB YOUR EYE!  The day of surgery:  Minimal physical activity, but bed rest is not required  No reading, computer work, or close hand work  No bending, lifting, or straining.  May watch TV  For 24 hours:  No driving, legal decisions, or alcoholic beverages  Safety precautions  Eat anything you prefer: It is better to start with liquids, then soup then solid foods.  _____ Eye patch should be worn until postoperative exam tomorrow.  ____ Solar shield eyeglasses should be worn for comfort in the sunlight/patch while sleeping  Resume all regular medications including aspirin or Coumadin if these  were discontinued prior to surgery. You may shower, bathe, shave, or wash your hair. Tylenol may be taken for mild discomfort.  Call your doctor if you experience significant pain, nausea, or vomiting, fever > 101 or other signs of infection. [phone] or [phone] Specific instructions:  Follow-up Information    PORFILIO,WILLIAM LOUIS, MD Follow up.   Specialty:  Ophthalmology Why:  11-25-16 at 8:05 Contact information: [address] [place] [address] [phone]         Eye Surgery Discharge Instructions  Expect mild scratchy sensation or mild soreness. DO NOT RUB YOUR EYE!  The day of surgery:  Minimal physical activity, but bed rest is not required  No reading, computer work, or close hand work  No bending, lifting, or straining.  May watch TV  For 24 hours:  No driving, legal decisions, or alcoholic beverages  Safety precautions  Eat anything you prefer: It is better to start with liquids, then soup then solid foods.  _____ Eye patch should be worn until postoperative exam tomorrow.  ____ Solar shield eyeglasses should be worn for comfort in the sunlight/patch while sleeping  Resume all regular medications including aspirin or Coumadin if these were discontinued prior to surgery. You may shower, bathe, shave, or wash your hair. Tylenol may be taken for mild discomfort.  Call your doctor if you experience significant pain, nausea, or vomiting, fever > 101 or other signs of infection. [phone] or [phone] Specific instructions:  Follow-up Information    PORFILIO,WILLIAM LOUIS, MD Follow up.   Specialty:  Ophthalmology Why:  11-25-16 at 8:05 Contact information: [address]  [place] [address] [phone]          Eye Surgery Discharge Instructions  Expect mild scratchy sensation or mild soreness. DO NOT RUB YOUR EYE!  The day of surgery:  Minimal physical activity, but bed rest is not required  No reading,  computer work, or close hand work  No bending, lifting, or straining.  May watch TV  For 24 hours:  No driving, legal decisions, or alcoholic beverages  Safety precautions  Eat anything you prefer: It is better to start with liquids, then soup then solid foods.  _____ Eye patch should be worn until postoperative exam tomorrow.  ____ Solar shield eyeglasses should be worn for comfort in the sunlight/patch while sleeping  Resume all regular medications including aspirin or Coumadin if these were discontinued prior to surgery. You may shower, bathe, shave, or wash your hair. Tylenol may be taken for mild discomfort.  Call your doctor if you experience significant pain, nausea, or vomiting, fever > 101 or other signs of infection. [phone] or [phone] Specific instructions:  Follow-up Information    PORFILIO,WILLIAM LOUIS, MD Follow up.   Specialty:  Ophthalmology Why:  11-25-16 at 8:05 Contact information: [place] [address] [phone]

## 2016-11-24 NOTE — Anesthesia Postprocedure Evaluation (Signed)
Anesthesia Post Note  Patient: Tony Watson  Procedure(s) Performed: Procedure(s) (LRB): CATARACT EXTRACTION PHACO AND INTRAOCULAR LENS PLACEMENT (IOC) (Right)  Patient location during evaluation: PACU Anesthesia Type: MAC Level of consciousness: awake, awake and alert and oriented Pain management: pain level controlled Vital Signs Assessment: post-procedure vital signs reviewed and stable Respiratory status: spontaneous breathing Cardiovascular status: blood pressure returned to baseline Postop Assessment: no signs of nausea or vomiting Anesthetic complications: no     Last Vitals:  Vitals:   11/24/16 0714 11/24/16 0825  BP: 121/60 120/67  Pulse: 87 86  Resp: 16 15  Temp:  36.3 C    Last Pain:  Vitals:   11/24/16 0825  TempSrc: Tympanic                 [name] [name]

## 2016-11-24 NOTE — Op Note (Signed)
PREOPERATIVE DIAGNOSIS:  Nuclear sclerotic cataract of the right eye.   POSTOPERATIVE DIAGNOSIS:  NUCLEAR SCLEROTIC CATARACT RIGHT EYE   OPERATIVE PROCEDURE: Procedure(s): CATARACT EXTRACTION PHACO AND INTRAOCULAR LENS PLACEMENT (IOC)   SURGEON:  Birder Robson, MD.   ANESTHESIA:  Anesthesiologist: Gijsbertus Lonia Mad, MD CRNA: Hedda Slade, CRNA  1.      Managed anesthesia care. 2.      0.13ml of Shugarcaine was instilled in the eye following the paracentesis.   COMPLICATIONS:  None.   TECHNIQUE:   Stop and chop   DESCRIPTION OF PROCEDURE:  The patient was examined and consented in the preoperative holding area where the aforementioned topical anesthesia was applied to the right eye and then brought back to the Operating Room where the right eye was prepped and draped in the usual sterile ophthalmic fashion and a lid speculum was placed. A paracentesis was created with the side port blade and the anterior chamber was filled with viscoelastic. A near clear corneal incision was performed with the steel keratome. A continuous curvilinear capsulorrhexis was performed with a cystotome followed by the capsulorrhexis forceps. Hydrodissection and hydrodelineation were carried out with BSS on a blunt cannula. The lens was removed in a stop and chop  technique and the remaining cortical material was removed with the irrigation-aspiration handpiece. The capsular bag was inflated with viscoelastic and the Technis [id number]  lens was placed in the capsular bag without complication. The remaining viscoelastic was removed from the eye with the irrigation-aspiration handpiece. The wounds were hydrated. The anterior chamber was flushed with Miostat and the eye was inflated to physiologic pressure. 0.96ml of Vigamox was placed in the anterior chamber. The wounds were found to be water tight. The eye was dressed with Vigamox. The patient was given protective glasses to wear throughout the day and a shield with  which to sleep tonight. The patient was also given drops with which to begin a drop regimen today and will follow-up with me in one day.  Implant Name Type Inv. Item Serial No. Manufacturer Lot No. LRB No. Used  LENS IOL DIOP 17.5 - [id number] Intraocular Lens LENS IOL DIOP 17.5 [id number]   Right 1   Procedure(s) with comments: CATARACT EXTRACTION PHACO AND INTRAOCULAR LENS PLACEMENT (IOC) (Right) - [place] 1:04.3 AP% 22.3 CDE 14.35 Fluid pack lot # [id number]  Electronically signed: [place] 11/24/2016 8:24 AM

## 2016-11-27 ENCOUNTER — Other Ambulatory Visit: Payer: Self-pay | Admitting: Unknown Physician Specialty

## 2016-11-27 DIAGNOSIS — R221 Localized swelling, mass and lump, neck: Secondary | ICD-10-CM

## 2016-12-01 ENCOUNTER — Other Ambulatory Visit: Payer: Self-pay | Admitting: Radiology

## 2016-12-03 ENCOUNTER — Ambulatory Visit
Admission: RE | Admit: 2016-12-03 | Discharge: 2016-12-03 | Disposition: A | Discharge status: Home / Self Care | Payer: 59 | Source: Ambulatory Visit | Attending: Unknown Physician Specialty | Admitting: Unknown Physician Specialty

## 2016-12-03 DIAGNOSIS — I110 Hypertensive heart disease with heart failure: Secondary | ICD-10-CM | POA: Diagnosis not present

## 2016-12-03 DIAGNOSIS — I252 Old myocardial infarction: Secondary | ICD-10-CM | POA: Insufficient documentation

## 2016-12-03 DIAGNOSIS — R0600 Dyspnea, unspecified: Secondary | ICD-10-CM | POA: Insufficient documentation

## 2016-12-03 DIAGNOSIS — Z9114 Patient's other noncompliance with medication regimen: Secondary | ICD-10-CM | POA: Diagnosis not present

## 2016-12-03 DIAGNOSIS — E1143 Type 2 diabetes mellitus with diabetic autonomic (poly)neuropathy: Secondary | ICD-10-CM | POA: Diagnosis not present

## 2016-12-03 DIAGNOSIS — M4800 Spinal stenosis, site unspecified: Secondary | ICD-10-CM | POA: Diagnosis not present

## 2016-12-03 DIAGNOSIS — Z7984 Long term (current) use of oral hypoglycemic drugs: Secondary | ICD-10-CM | POA: Insufficient documentation

## 2016-12-03 DIAGNOSIS — E785 Hyperlipidemia, unspecified: Secondary | ICD-10-CM | POA: Diagnosis not present

## 2016-12-03 DIAGNOSIS — Z8673 Personal history of transient ischemic attack (TIA), and cerebral infarction without residual deficits: Secondary | ICD-10-CM | POA: Diagnosis not present

## 2016-12-03 DIAGNOSIS — Z955 Presence of coronary angioplasty implant and graft: Secondary | ICD-10-CM | POA: Diagnosis not present

## 2016-12-03 DIAGNOSIS — K219 Gastro-esophageal reflux disease without esophagitis: Secondary | ICD-10-CM | POA: Insufficient documentation

## 2016-12-03 DIAGNOSIS — I5020 Unspecified systolic (congestive) heart failure: Secondary | ICD-10-CM | POA: Insufficient documentation

## 2016-12-03 DIAGNOSIS — J029 Acute pharyngitis, unspecified: Secondary | ICD-10-CM | POA: Diagnosis not present

## 2016-12-03 DIAGNOSIS — I2510 Atherosclerotic heart disease of native coronary artery without angina pectoris: Secondary | ICD-10-CM | POA: Insufficient documentation

## 2016-12-03 DIAGNOSIS — J449 Chronic obstructive pulmonary disease, unspecified: Secondary | ICD-10-CM | POA: Diagnosis not present

## 2016-12-03 DIAGNOSIS — R221 Localized swelling, mass and lump, neck: Secondary | ICD-10-CM | POA: Diagnosis present

## 2016-12-03 DIAGNOSIS — C9310 Chronic myelomonocytic leukemia not having achieved remission: Secondary | ICD-10-CM | POA: Insufficient documentation

## 2016-12-03 DIAGNOSIS — Z87891 Personal history of nicotine dependence: Secondary | ICD-10-CM | POA: Insufficient documentation

## 2016-12-03 LAB — CBC
HCT: 30.7 % — ABNORMAL LOW (ref 40.0–52.0)
Hemoglobin: 10.7 g/dL — ABNORMAL LOW (ref 13.0–18.0)
MCH: 32.3 pg (ref 26.0–34.0)
MCHC: 34.9 g/dL (ref 32.0–36.0)
MCV: 92.6 fL (ref 80.0–100.0)
PLATELETS: 156 10*3/uL (ref 150–440)
RBC: 3.31 MIL/uL — AB (ref 4.40–5.90)
RDW: 15.6 % — ABNORMAL HIGH (ref 11.5–14.5)
WBC: 6.2 10*3/uL (ref 3.8–10.6)

## 2016-12-03 LAB — POCT I-STAT CREATININE: CREATININE: 0.7 mg/dL (ref 0.61–1.24)

## 2016-12-03 LAB — PROTIME-INR
INR: 1.23
PROTHROMBIN TIME: 15.6 s — AB (ref 11.4–15.2)

## 2016-12-03 LAB — APTT: aPTT: 39 seconds — ABNORMAL HIGH (ref 24–36)

## 2016-12-03 LAB — GLUCOSE, CAPILLARY: GLUCOSE-CAPILLARY: 104 mg/dL — AB (ref 65–99)

## 2016-12-03 MED ORDER — FENTANYL CITRATE (PF) 100 MCG/2ML IJ SOLN
INTRAMUSCULAR | Status: AC
  Filled 2016-12-03: qty 2

## 2016-12-03 MED ORDER — SODIUM CHLORIDE 0.9 % IV SOLN
INTRAVENOUS | Status: DC
  Administered 2016-12-03: 10:00:00 via INTRAVENOUS

## 2016-12-03 MED ORDER — MIDAZOLAM HCL 5 MG/5ML IJ SOLN
INTRAMUSCULAR | Status: AC | PRN
  Administered 2016-12-03: 0.5 mg via INTRAVENOUS
  Administered 2016-12-03: 1 mg via INTRAVENOUS

## 2016-12-03 MED ORDER — MIDAZOLAM HCL 5 MG/5ML IJ SOLN
INTRAMUSCULAR | Status: AC
  Filled 2016-12-03: qty 5

## 2016-12-03 MED ORDER — IOPAMIDOL (ISOVUE-300) INJECTION 61%
75.0000 mL | Freq: Once | INTRAVENOUS | Status: AC | PRN
Start: 2016-12-03 — End: 2016-12-03
  Administered 2016-12-03: 75 mL via INTRAVENOUS

## 2016-12-03 MED ORDER — FENTANYL CITRATE (PF) 100 MCG/2ML IJ SOLN
INTRAMUSCULAR | Status: AC | PRN
  Administered 2016-12-03: 50 ug via INTRAVENOUS

## 2016-12-03 NOTE — Procedures (Signed)
Pre Procedure Dx: Palpable cervical LAN Post Procedural Dx: Same  Technically successful US guided biopsy of palpable left cervical LN.  EBL: None  No immediate complications.   Ronny Bacon, MD Pager #: [phone]

## 2016-12-03 NOTE — Progress Notes (Signed)
  Referring Physician(s): McQueen,Chapman  Supervising Physician: Watts, John  Patient Status:  [facility] OP  Chief Complaint:  "I'm here for a biopsy"  Subjective:  Pt familiar to IR service from prior bone marrow biopsy in 2017. He has a hx of CMML. He has also had several month hx of sore throat and left neck mass which has had variability in size but not completely resolved despite antibiotic therapy. Previous office FNA by ENT was inconclusive. He is a smoker. He is now scheduled for image guided biopsy of the left neck mass and presents today for the procedure. He denies fever,HA,CP, worsening dyspnea, abd pain,N/V or bleeding. He does have occ cough and back pain periodically.  Past Medical History:  Diagnosis Date  . Asthma   . CAD (coronary artery disease)    inferior MI 11/99 (in [place]) with PCI to mRCA. last LHC 2004 ([place]): EF 50%, patent RCA stent. no obstructive disease. last myoview 2008: EF 42%, inferior infarct, no ischemia.   . COPD (chronic obstructive pulmonary disease) (HCC)   . Cough    CHRONIC  . Diabetic autonomic neuropathy associated with type 2 diabetes mellitus (HCC) 03/27/2015  . Diverticulosis 2002  . DM2 (diabetes mellitus, type 2) (HCC)    A1c 6.0% 12/11  . Dyspnea   . Edema   . GERD (gastroesophageal reflux disease)   . H/O wheezing   . HTN (hypertension)   . Hyperlipidemia   . Ischemic cardiomyopathy    mild with EF 42% on myoviews 2008.  . Medical non-compliance   . Myocardial infarction (HCC)   . Nicotine dependence    chronic, active  . Seasonal allergic rhinitis   . Smoker   . Spinal stenosis   . Stroke (HCC)    RECURRENT AFFECTING MEMORY  . Systolic heart failure secondary to coronary artery disease (HCC) 05/30/2012  . TIA (transient ischemic attack)    hx; now s/p PFO closure 2004 (in [place])  . Ulcer    Past Surgical History:  Procedure Laterality Date  . CARDIAC CATHETERIZATION  11/99   CI per [facility]  . CATARACT EXTRACTION W/PHACO  Right 11/24/2016   Procedure: CATARACT EXTRACTION PHACO AND INTRAOCULAR LENS PLACEMENT (IOC);  Surgeon: William Porfilio, MD;  Location: [facility] ORS;  Service: Ophthalmology;  Laterality: Right;  US 1:04.3 AP% 22.3 CDE 14.35 Fluid pack lot # [id number]  . CORONARY ANGIOPLASTY     STENT  . CORONARY ARTERY BYPASS GRAFT    . EYE SURGERY    . open heart surgery  2004   PFO repair     Allergies: Patient has no known allergies.  Medications: Prior to Admission medications   Medication Sig Start Date End Date Taking? Authorizing Provider  acetaminophen (TYLENOL) 500 MG tablet Take 1,000 mg by mouth daily as needed for moderate pain or headache.    Historical Provider, MD  carvedilol (COREG) 3.125 MG tablet TAKE 1 TABLET (3.125 MG TOTAL) BY MOUTH 2 (TWO) TIMES DAILY. 09/12/16   Spencer Copland, MD  Diphenhyd-Hydrocort-Nystatin (FIRST-DUKES MOUTHWASH MT) Use as directed 1 Dose in the mouth or throat 3 (three) times daily.     Historical Provider, MD  gabapentin (NEURONTIN) 300 MG capsule Take 1 capsule (300 mg total) by mouth 2 (two) times daily. Patient taking differently: Take 300 mg by mouth at bedtime.  07/22/16   Spencer Copland, MD  glucosamine-chondroitin 500-400 MG tablet Take 1 tablet by mouth 3 (three) times daily.    Historical Provider, MD  ipratropium-albuterol (DUONEB)   0.5-2.5 (3) MG/3ML SOLN TAKE 3 MLS BY NEBULIZATION EVERY 4 (FOUR) HOURS AS NEEDED. Patient taking differently: Take 3 mLs by nebulization every 4 (four) hours as needed (shortness of breath).  10/28/16   Spencer Copland, MD  magnesium 30 MG tablet Take 250 mg by mouth every morning.    Historical Provider, MD  metFORMIN (GLUCOPHAGE-XR) 500 MG 24 hr tablet TAKE 1 TABLET (500 MG TOTAL) BY MOUTH DAILY WITH BREAKFAST. 09/12/16   Spencer Copland, MD  omeprazole (PRILOSEC) 20 MG capsule Take 20 mg by mouth daily.    Historical Provider, MD  oxymetazoline (AFRIN) 0.05 % nasal spray Place 1 spray into both nostrils daily as needed for  congestion.    Historical Provider, MD  pseudoephedrine (SUDAFED) 30 MG tablet Take 30 mg by mouth every morning.    Historical Provider, MD  SYMBICORT 160-4.5 MCG/ACT inhaler INHALE 2 PUFFS INTO THE LUNGS 2 (TWO) TIMES DAILY. 09/21/16   Spencer Copland, MD  tiZANidine (ZANAFLEX) 4 MG tablet TAKE 1 TABLET (4 MG TOTAL) BY MOUTH NIGHTLY. 03/25/16   Spencer Copland, MD     Vital Signs: Vitals:   12/03/16 0944  BP: 128/70  Pulse: 92  Resp: 14  Temp: 98.2 F (36.8 C)      Physical Exam awake/alert; palpable NT left neck mass? node ;chest- distant BS bilat; heart- RRR; abd- soft, +BS,NT; no LE edema  Imaging: No results found.  Labs:  CBC:  Recent Labs  07/16/16 0844 07/29/16 1425 10/27/16 1400 12/03/16 0933  WBC 7.1 2.4* 7.8 6.2  HGB 12.6* 12.6* 11.9* 10.7*  HCT 36.9* 36.8* 33.8* 30.7*  PLT 124.0* 174 179 156    COAGS: No results for input(s): INR, APTT in the last 8760 hours.  BMP:  Recent Labs  07/16/16 0844 12/03/16 0915  NA 140  --   K 4.8  --   CL 103  --   CO2 29  --   GLUCOSE 122*  --   BUN 10  --   CALCIUM 9.2  --   CREATININE 0.88 0.70    LIVER FUNCTION TESTS:  Recent Labs  07/16/16 0844  BILITOT 0.4  AST 24  ALT 24  ALKPHOS 46  PROT 7.5  ALBUMIN 4.2    Assessment and Plan: Pt with hx tobacco abuse,CMML; now with left neck mass of uncertain etiology; presents today for image guided biopsy of the neck mass/adenopathy.Risks and benefits discussed with the patient including, but not limited to bleeding, infection, damage to adjacent structures or low yield requiring additional tests. All of the patient's questions were answered, patient is agreeable to proceed.Consent signed and in chart.     Electronically Signed: D. Kevin Allred 12/03/2016, 9:58 AM   I spent a total of 20 minutes at the the patient's bedside AND on the patient's hospital floor or unit, greater than 50% of which was counseling/coordinating care for image guided left  neck mass biopsy    Patient ID: Tony Watson, male   DOB: 10/07/1950, 65 y.o.   MRN: [id number]  

## 2016-12-07 LAB — SURGICAL PATHOLOGY

## 2016-12-10 ENCOUNTER — Telehealth: Payer: Self-pay

## 2016-12-10 ENCOUNTER — Encounter: Payer: Self-pay | Admitting: Oncology

## 2016-12-10 NOTE — Telephone Encounter (Signed)
Tony Watson has an opening tomorrow 12/11/16 at 830am.

## 2016-12-10 NOTE — Telephone Encounter (Signed)
Per [facility] for Lung Nodule New Leukemia dx needs sooner than 6/4 [place] only requested please call wife if something sooner.

## 2016-12-10 NOTE — Telephone Encounter (Signed)
Unable to come in the morning  Per Spouse.  Schedule 6/4 still on waitlist

## 2016-12-14 ENCOUNTER — Encounter: Payer: Self-pay | Admitting: Interventional Radiology

## 2016-12-24 ENCOUNTER — Encounter: Payer: Self-pay | Admitting: *Deleted

## 2016-12-29 ENCOUNTER — Ambulatory Visit: Payer: 59 | Admitting: Anesthesiology

## 2016-12-29 ENCOUNTER — Ambulatory Visit
Admission: RE | Admit: 2016-12-29 | Discharge: 2016-12-29 | Disposition: A | Discharge status: Home / Self Care | Payer: 59 | Source: Ambulatory Visit | Attending: Ophthalmology | Admitting: Ophthalmology

## 2016-12-29 ENCOUNTER — Encounter
Admission: RE | Disposition: A | Discharge status: Home / Self Care | Payer: Self-pay | Source: Ambulatory Visit | Attending: Ophthalmology

## 2016-12-29 DIAGNOSIS — Z7951 Long term (current) use of inhaled steroids: Secondary | ICD-10-CM | POA: Diagnosis not present

## 2016-12-29 DIAGNOSIS — I2510 Atherosclerotic heart disease of native coronary artery without angina pectoris: Secondary | ICD-10-CM | POA: Diagnosis not present

## 2016-12-29 DIAGNOSIS — E1136 Type 2 diabetes mellitus with diabetic cataract: Secondary | ICD-10-CM | POA: Insufficient documentation

## 2016-12-29 DIAGNOSIS — G4730 Sleep apnea, unspecified: Secondary | ICD-10-CM | POA: Insufficient documentation

## 2016-12-29 DIAGNOSIS — I10 Essential (primary) hypertension: Secondary | ICD-10-CM | POA: Diagnosis not present

## 2016-12-29 DIAGNOSIS — Z8673 Personal history of transient ischemic attack (TIA), and cerebral infarction without residual deficits: Secondary | ICD-10-CM | POA: Diagnosis not present

## 2016-12-29 DIAGNOSIS — I252 Old myocardial infarction: Secondary | ICD-10-CM | POA: Insufficient documentation

## 2016-12-29 DIAGNOSIS — K219 Gastro-esophageal reflux disease without esophagitis: Secondary | ICD-10-CM | POA: Diagnosis not present

## 2016-12-29 DIAGNOSIS — M1990 Unspecified osteoarthritis, unspecified site: Secondary | ICD-10-CM | POA: Insufficient documentation

## 2016-12-29 DIAGNOSIS — J449 Chronic obstructive pulmonary disease, unspecified: Secondary | ICD-10-CM | POA: Insufficient documentation

## 2016-12-29 DIAGNOSIS — Z955 Presence of coronary angioplasty implant and graft: Secondary | ICD-10-CM | POA: Insufficient documentation

## 2016-12-29 DIAGNOSIS — Z7984 Long term (current) use of oral hypoglycemic drugs: Secondary | ICD-10-CM | POA: Diagnosis not present

## 2016-12-29 DIAGNOSIS — F17200 Nicotine dependence, unspecified, uncomplicated: Secondary | ICD-10-CM | POA: Insufficient documentation

## 2016-12-29 HISTORY — DX: Unspecified osteoarthritis, unspecified site: M19.90

## 2016-12-29 HISTORY — PX: CATARACT EXTRACTION W/PHACO: SHX586

## 2016-12-29 LAB — GLUCOSE, CAPILLARY: Glucose-Capillary: 109 mg/dL — ABNORMAL HIGH (ref 65–99)

## 2016-12-29 SURGERY — PHACOEMULSIFICATION, CATARACT, WITH IOL INSERTION
Anesthesia: Monitor Anesthesia Care | Site: Eye | Laterality: Left | Wound class: Clean

## 2016-12-29 MED ORDER — MOXIFLOXACIN HCL 0.5 % OP SOLN
OPHTHALMIC | Status: DC | PRN
  Administered 2016-12-29: .2 mL via OPHTHALMIC

## 2016-12-29 MED ORDER — NA CHONDROIT SULF-NA HYALURON 40-17 MG/ML IO SOLN
INTRAOCULAR | Status: AC
  Filled 2016-12-29: qty 1

## 2016-12-29 MED ORDER — MOXIFLOXACIN HCL 0.5 % OP SOLN
OPHTHALMIC | Status: AC
  Filled 2016-12-29: qty 3

## 2016-12-29 MED ORDER — POVIDONE-IODINE 5 % OP SOLN
OPHTHALMIC | Status: DC | PRN
  Administered 2016-12-29: 1 via OPHTHALMIC

## 2016-12-29 MED ORDER — ONDANSETRON HCL 4 MG/2ML IJ SOLN: 4.0000 mg | Freq: Once | INTRAMUSCULAR | Status: DC | PRN

## 2016-12-29 MED ORDER — MIDAZOLAM HCL 2 MG/2ML IJ SOLN
INTRAMUSCULAR | Status: AC
  Filled 2016-12-29: qty 2

## 2016-12-29 MED ORDER — POVIDONE-IODINE 5 % OP SOLN
OPHTHALMIC | Status: AC
  Filled 2016-12-29: qty 30

## 2016-12-29 MED ORDER — MOXIFLOXACIN HCL 0.5 % OP SOLN: 1.0000 [drp] | Freq: Once | OPHTHALMIC | Status: DC

## 2016-12-29 MED ORDER — FENTANYL CITRATE (PF) 100 MCG/2ML IJ SOLN
INTRAMUSCULAR | Status: AC
  Filled 2016-12-29: qty 2

## 2016-12-29 MED ORDER — MIDAZOLAM HCL 2 MG/2ML IJ SOLN
INTRAMUSCULAR | Status: DC | PRN
  Administered 2016-12-29: 1 mg via INTRAVENOUS

## 2016-12-29 MED ORDER — ARMC OPHTHALMIC DILATING DROPS
OPHTHALMIC | Status: AC
  Administered 2016-12-29: 1 via OPHTHALMIC
  Filled 2016-12-29: qty 0.4

## 2016-12-29 MED ORDER — FENTANYL CITRATE (PF) 100 MCG/2ML IJ SOLN
INTRAMUSCULAR | Status: DC | PRN
  Administered 2016-12-29: 50 ug via INTRAVENOUS

## 2016-12-29 MED ORDER — SODIUM CHLORIDE 0.9 % IV SOLN
INTRAVENOUS | Status: DC
  Administered 2016-12-29 (×2): via INTRAVENOUS

## 2016-12-29 MED ORDER — EPINEPHRINE PF 1 MG/ML IJ SOLN
INTRAOCULAR | Status: DC | PRN
  Administered 2016-12-29: 1 mL via OPHTHALMIC

## 2016-12-29 MED ORDER — FENTANYL CITRATE (PF) 100 MCG/2ML IJ SOLN
25.0000 ug | INTRAMUSCULAR | Status: DC | PRN
Start: 2016-12-29 — End: 2016-12-29

## 2016-12-29 MED ORDER — CARBACHOL 0.01 % IO SOLN
INTRAOCULAR | Status: DC | PRN
  Administered 2016-12-29: .5 mL via INTRAOCULAR

## 2016-12-29 MED ORDER — EPINEPHRINE PF 1 MG/ML IJ SOLN
INTRAMUSCULAR | Status: AC
  Filled 2016-12-29: qty 2

## 2016-12-29 MED ORDER — LIDOCAINE HCL (PF) 4 % IJ SOLN
INTRAOCULAR | Status: DC | PRN
  Administered 2016-12-29: 2 mL via OPHTHALMIC

## 2016-12-29 MED ORDER — NA CHONDROIT SULF-NA HYALURON 40-17 MG/ML IO SOLN
INTRAOCULAR | Status: DC | PRN
  Administered 2016-12-29: 1 mL via INTRAOCULAR

## 2016-12-29 MED ORDER — ARMC OPHTHALMIC DILATING DROPS
1.0000 "application " | Freq: Once | OPHTHALMIC | Status: AC
  Administered 2016-12-29 (×3): 1 via OPHTHALMIC

## 2016-12-29 SURGICAL SUPPLY — 14 items
GLOVE BIO SURGEON STRL SZ8 (GLOVE) ×3 IMPLANT
GLOVE BIOGEL M 6.5 STRL (GLOVE) ×3 IMPLANT
GLOVE SURG LX 8.0 MICRO (GLOVE) ×2
GLOVE SURG LX STRL 8.0 MICRO (GLOVE) ×1 IMPLANT
GOWN STRL REUS W/ TWL LRG LVL3 (GOWN DISPOSABLE) ×2 IMPLANT
GOWN STRL REUS W/TWL LRG LVL3 (GOWN DISPOSABLE) ×4
LENS IOL TECNIS ITEC 17.5 (Intraocular Lens) ×3 IMPLANT
PACK CATARACT (MISCELLANEOUS) ×3 IMPLANT
PACK CATARACT BRASINGTON LX (MISCELLANEOUS) ×3 IMPLANT
SOL BSS BAG (MISCELLANEOUS) ×3
SOLUTION BSS BAG (MISCELLANEOUS) ×1 IMPLANT
SYR 5ML LL (SYRINGE) ×3 IMPLANT
WATER STERILE IRR 250ML POUR (IV SOLUTION) ×3 IMPLANT
WIPE NON LINTING 3.25X3.25 (MISCELLANEOUS) ×3 IMPLANT

## 2016-12-29 NOTE — Transfer of Care (Signed)
Immediate Anesthesia Transfer of Care Note  Patient: Tony Watson  Procedure(s) Performed: Procedure(s) with comments: CATARACT EXTRACTION PHACO AND INTRAOCULAR LENS PLACEMENT (IOC) (Left) - [place] 00:52 AP% 18.5 CDE 9.67 Fluid pack lot # [id number]  Patient Location: PACU  Anesthesia Type:MAC  Level of Consciousness: awake, alert  and oriented  Airway & Oxygen Therapy: Patient Spontanous Breathing  Post-op Assessment: Report given to RN and Post -op Vital signs reviewed and stable  Post vital signs: Reviewed and stable  Last Vitals:  Vitals:   12/29/16 0633  BP: (!) 160/79  Pulse: (!) 105  Resp: 16  Temp: 36.9 C    Last Pain:  Vitals:   12/29/16 0633  TempSrc: Oral         Complications: No apparent anesthesia complications

## 2016-12-29 NOTE — Anesthesia Post-op Follow-up Note (Cosign Needed)
Anesthesia QCDR form completed.        

## 2016-12-29 NOTE — Anesthesia Postprocedure Evaluation (Signed)
Anesthesia Post Note  Patient: Tony Watson  Procedure(s) Performed: Procedure(s) (LRB): CATARACT EXTRACTION PHACO AND INTRAOCULAR LENS PLACEMENT (IOC) (Left)  Patient location during evaluation: PACU Anesthesia Type: MAC Level of consciousness: awake, awake and alert and oriented Pain management: pain level controlled Vital Signs Assessment: post-procedure vital signs reviewed and stable Respiratory status: spontaneous breathing, nonlabored ventilation and respiratory function stable Cardiovascular status: stable Anesthetic complications: no     Last Vitals:  Vitals:   12/29/16 0633  BP: (!) 160/79  Pulse: (!) 105  Resp: 16  Temp: 36.9 C    Last Pain:  Vitals:   12/29/16 0633  TempSrc: Oral                 Terrez Ander,  [facility]

## 2016-12-29 NOTE — H&P (Signed)
All labs reviewed. Abnormal studies sent to patients PCP when indicated.  Previous H&P reviewed, patient examined, there are NO CHANGES.  Tony Watson LOUIS5/22/20187:57 AM

## 2016-12-29 NOTE — Discharge Instructions (Signed)
Eye Surgery Discharge Instructions  Expect mild scratchy sensation or mild soreness. DO NOT RUB YOUR EYE!  The day of surgery:  Minimal physical activity, but bed rest is not required  No reading, computer work, or close hand work  No bending, lifting, or straining.  May watch TV  For 24 hours:  No driving, legal decisions, or alcoholic beverages  Safety precautions  Eat anything you prefer: It is better to start with liquids, then soup then solid foods.  _____ Eye patch should be worn until postoperative exam tomorrow.  ____ Solar shield eyeglasses should be worn for comfort in the sunlight/patch while sleeping  Resume all regular medications including aspirin or Coumadin if these were discontinued prior to surgery. You may shower, bathe, shave, or wash your hair. Tylenol may be taken for mild discomfort.  Call your doctor if you experience significant pain, nausea, or vomiting, fever > 101 or other signs of infection. [phone] or [phone] Specific instructions:  Follow-up Information    Birder Robson, MD Follow up on 12/30/2016.   Specialty:  Ophthalmology Why:  10:00 Contact information: [address] [place] [address] [phone]

## 2016-12-29 NOTE — Op Note (Signed)
PREOPERATIVE DIAGNOSIS:  Nuclear sclerotic cataract of the left eye.   POSTOPERATIVE DIAGNOSIS:  Nuclear sclerotic cataract of the left eye.   OPERATIVE PROCEDURE: Procedure(s): CATARACT EXTRACTION PHACO AND INTRAOCULAR LENS PLACEMENT (IOC)   SURGEON:  Birder Robson, MD.   ANESTHESIA:  Anesthesiologist: Emmie Niemann, MD CRNA: Nelda Marseille, CRNA  1.      Managed anesthesia care. 2.     0.57ml of Shugarcaine was instilled following the paracentesis   COMPLICATIONS:  None.   TECHNIQUE:   Stop and chop   DESCRIPTION OF PROCEDURE:  The patient was examined and consented in the preoperative holding area where the aforementioned topical anesthesia was applied to the left eye and then brought back to the Operating Room where the left eye was prepped and draped in the usual sterile ophthalmic fashion and a lid speculum was placed. A paracentesis was created with the side port blade and the anterior chamber was filled with viscoelastic. A near clear corneal incision was performed with the steel keratome. A continuous curvilinear capsulorrhexis was performed with a cystotome followed by the capsulorrhexis forceps. Hydrodissection and hydrodelineation were carried out with BSS on a blunt cannula. The lens was removed in a stop and chop  technique and the remaining cortical material was removed with the irrigation-aspiration handpiece. The capsular bag was inflated with viscoelastic and the Technis [id number] lens was placed in the capsular bag without complication. The remaining viscoelastic was removed from the eye with the irrigation-aspiration handpiece. The wounds were hydrated. The anterior chamber was flushed with Miostat and the eye was inflated to physiologic pressure. 0.40ml Vigamox was placed in the anterior chamber. The wounds were found to be water tight. The eye was dressed with Vigamox. The patient was given protective glasses to wear throughout the day and a shield with which to sleep tonight.  The patient was also given drops with which to begin a drop regimen today and will follow-up with me in one day.  Implant Name Type Inv. Item Serial No. Manufacturer Lot No. LRB No. Used  LENS IOL DIOP 17.5 - [id number] Intraocular Lens LENS IOL DIOP 17.5 [id number]   Left 1    Procedure(s) with comments: CATARACT EXTRACTION PHACO AND INTRAOCULAR LENS PLACEMENT (IOC) (Left) - [place] 00:52 AP% 18.5 CDE 9.67 Fluid pack lot # [id number]  Electronically signed: [place] 12/29/2016 8:23 AM

## 2016-12-29 NOTE — Anesthesia Procedure Notes (Signed)
Date/Time: 12/29/2016 8:07 AM Performed by: Nelda Marseille Pre-anesthesia Checklist: Patient identified, Emergency Drugs available, Suction available, Patient being monitored and Timeout performed Oxygen Delivery Method: Nasal cannula

## 2016-12-29 NOTE — Anesthesia Preprocedure Evaluation (Signed)
Anesthesia Evaluation  Patient identified by MRN, date of birth, ID band Patient awake    Reviewed: Allergy & Precautions, NPO status , Patient's Chart, lab work & pertinent test results  History of Anesthesia Complications Negative for: history of anesthetic complications  Airway Mallampati: II  TM Distance: >3 FB Neck ROM: Full    Dental no notable dental hx.    Pulmonary asthma , sleep apnea , COPD,  COPD inhaler, Current Smoker,    breath sounds clear to auscultation- rhonchi (-) wheezing      Cardiovascular hypertension, Pt. on medications + CAD, + Past MI and + Cardiac Stents (last stent >55yr ago)   Rhythm:Regular Rate:Normal - Systolic murmurs and - Diastolic murmurs    Neuro/Psych TIACVA, No Residual Symptoms negative psych ROS   GI/Hepatic Neg liver ROS, GERD  ,  Endo/Other  diabetes, Oral Hypoglycemic Agents  Renal/GU negative Renal ROS     Musculoskeletal  (+) Arthritis ,   Abdominal (+) - obese,   Peds  Hematology negative hematology ROS (+)   Anesthesia Other Findings Past Medical History: No date: Arthritis No date: Asthma No date: Bronchitis No date: CAD (coronary artery disease)     Comment[facility] [place] [facility] [place] [facility]               [facility]: EF 50%, patent RCA stent.              no obstructive disease. last myoview 2008: EF               42%, inferior infarct, no ischemia.  No date: Cancer ([facility]     Comment: CMML Lukemia No date: COPD (chronic obstructive pulmonary disease) (* No date: Cough     Comment: CHRONIC 03/27/2015: Diabetic autonomic neuropathy associated with * 2002: Diverticulosis No date: DM2 (diabetes mellitus, type 2) (HCC)     Comment: A1c 6.0% 12/11 No date: Dyspnea No date: Edema No date: GERD (gastroesophageal reflux disease) No date: H/O wheezing No date: HTN (hypertension) No date: Hyperlipidemia No date: Ischemic cardiomyopathy   Comment: mild with EF 42% on myoviews 2008. No date: Medical non-compliance [place] date: Nic[place] dependence     Comment: chronic, active No date: Pneumonia     Comment: in the past No date: Seasonal allergic rhinitis No date: Sleep apnea No date: Smoker No date: Spinal stenosis No date: Stroke ([facility]     Comment: RECURRENT AFFECTING MEMORY 182/42/3536 Systolic heart failure secondary to coronary a* No date: TIA (transient ischemic attack)     Comment: hx; now s/p PFO closure 2004 ([place] No date: Ulcer   Reproductive/Obstetrics                             Anesthesia Physical Anesthesia Plan  ASA: III  Anesthesia Plan: MAC   Post-op Pain Management:    Induction: Intravenous  Airway Management Planned: Natural Airway  Additional Equipment:   Intra-op Plan:   Post-operative Plan:   Informed Consent: I have reviewed the patients History and Physical, chart, labs and discussed the procedure including the risks, benefits and alternatives for the proposed anesthesia with the patient or authorized representative who has indicated his/her understanding and acceptance.     Plan Discussed with: CRNA and Anesthesiologist  Anesthesia Plan Comments:         Anesthesia Quick Evaluation

## 2017-01-11 ENCOUNTER — Institutional Professional Consult (permissible substitution): Payer: 59 | Admitting: Internal Medicine

## 2017-01-27 ENCOUNTER — Other Ambulatory Visit: Payer: Self-pay

## 2017-01-27 ENCOUNTER — Inpatient Hospital Stay: Payer: 59 | Attending: Oncology

## 2017-01-27 DIAGNOSIS — I252 Old myocardial infarction: Secondary | ICD-10-CM | POA: Diagnosis not present

## 2017-01-27 DIAGNOSIS — K219 Gastro-esophageal reflux disease without esophagitis: Secondary | ICD-10-CM | POA: Diagnosis not present

## 2017-01-27 DIAGNOSIS — C9310 Chronic myelomonocytic leukemia not having achieved remission: Secondary | ICD-10-CM | POA: Diagnosis not present

## 2017-01-27 DIAGNOSIS — I2510 Atherosclerotic heart disease of native coronary artery without angina pectoris: Secondary | ICD-10-CM | POA: Diagnosis not present

## 2017-01-27 DIAGNOSIS — Z79899 Other long term (current) drug therapy: Secondary | ICD-10-CM | POA: Diagnosis not present

## 2017-01-27 DIAGNOSIS — J449 Chronic obstructive pulmonary disease, unspecified: Secondary | ICD-10-CM | POA: Diagnosis not present

## 2017-01-27 DIAGNOSIS — Z951 Presence of aortocoronary bypass graft: Secondary | ICD-10-CM | POA: Diagnosis not present

## 2017-01-27 DIAGNOSIS — Z7984 Long term (current) use of oral hypoglycemic drugs: Secondary | ICD-10-CM | POA: Diagnosis not present

## 2017-01-27 DIAGNOSIS — I255 Ischemic cardiomyopathy: Secondary | ICD-10-CM | POA: Diagnosis not present

## 2017-01-27 DIAGNOSIS — G4730 Sleep apnea, unspecified: Secondary | ICD-10-CM | POA: Diagnosis not present

## 2017-01-27 DIAGNOSIS — I5020 Unspecified systolic (congestive) heart failure: Secondary | ICD-10-CM | POA: Diagnosis not present

## 2017-01-27 DIAGNOSIS — Z955 Presence of coronary angioplasty implant and graft: Secondary | ICD-10-CM | POA: Insufficient documentation

## 2017-01-27 DIAGNOSIS — F17210 Nicotine dependence, cigarettes, uncomplicated: Secondary | ICD-10-CM | POA: Insufficient documentation

## 2017-01-27 DIAGNOSIS — I110 Hypertensive heart disease with heart failure: Secondary | ICD-10-CM | POA: Insufficient documentation

## 2017-01-27 DIAGNOSIS — R634 Abnormal weight loss: Secondary | ICD-10-CM | POA: Insufficient documentation

## 2017-01-27 DIAGNOSIS — E1143 Type 2 diabetes mellitus with diabetic autonomic (poly)neuropathy: Secondary | ICD-10-CM | POA: Diagnosis not present

## 2017-01-27 DIAGNOSIS — Z8673 Personal history of transient ischemic attack (TIA), and cerebral infarction without residual deficits: Secondary | ICD-10-CM | POA: Insufficient documentation

## 2017-01-27 DIAGNOSIS — E785 Hyperlipidemia, unspecified: Secondary | ICD-10-CM | POA: Insufficient documentation

## 2017-01-27 LAB — CBC WITH DIFFERENTIAL/PLATELET
BASOS ABS: 0 10*3/uL (ref 0–0.1)
Band Neutrophils: 4 %
Basophils Relative: 0 %
Eosinophils Absolute: 0 10*3/uL (ref 0–0.7)
Eosinophils Relative: 0 %
HCT: 29.7 % — ABNORMAL LOW (ref 40.0–52.0)
Hemoglobin: 10.4 g/dL — ABNORMAL LOW (ref 13.0–18.0)
LYMPHS PCT: 23 %
Lymphs Abs: 1.3 10*3/uL (ref 1.0–3.6)
MCH: 32.1 pg (ref 26.0–34.0)
MCHC: 35.1 g/dL (ref 32.0–36.0)
MCV: 91.5 fL (ref 80.0–100.0)
MONO ABS: 2.7 10*3/uL — AB (ref 0.2–1.0)
MONOS PCT: 46 %
Metamyelocytes Relative: 1 %
NEUTROS PCT: 26 %
Neutro Abs: 1.8 10*3/uL (ref 1.4–6.5)
PLATELETS: 205 10*3/uL (ref 150–440)
RBC: 3.24 MIL/uL — AB (ref 4.40–5.90)
RDW: 15.8 % — AB (ref 11.5–14.5)
WBC: 5.8 10*3/uL (ref 3.8–10.6)

## 2017-02-02 ENCOUNTER — Telehealth: Payer: Self-pay | Admitting: *Deleted

## 2017-02-02 NOTE — Telephone Encounter (Signed)
Appt for Thursday at 215 agreed on

## 2017-02-02 NOTE — Telephone Encounter (Signed)
Asking about results form labs last week, Asking to see MD ASAP for 45# weight loss in past several months. Per Dr Grayland Ormond, will see him next week if nothing available this week

## 2017-02-03 NOTE — Progress Notes (Signed)
[facility]  [phone] [phone]) [phone]  ID: Tony Watson OB: 08-20-1950  MR#: [id number]  JJO#:[id number]  Patient Care Team: Owens Loffler, MD as PCP - General  CHIEF COMPLAINT: Bone marrow biopsy proven CMML-1.  INTERVAL HISTORY: Patient returns to clinic today as an add-on after concern of approximately 40 finally, weight loss over the last 6 months. He has persistent weakness and fatigue. He has a poor appetite. He denies any fevers or night sweats. He has no neurologic complaints. He has no chest pain or shortness of breath. He denies any nausea, vomiting, constipation, or diarrhea. He has no urinary complaints. Patient offers no further specific complaints today.  REVIEW OF SYSTEMS:   Review of Systems  Constitutional: Positive for malaise/fatigue and weight loss. Negative for diaphoresis and fever.  HENT: Negative.  Negative for sore throat.   Respiratory: Negative for cough and shortness of breath.   Cardiovascular: Negative.  Negative for chest pain and leg swelling.  Gastrointestinal: Negative.  Negative for abdominal pain, nausea and vomiting.  Genitourinary: Negative.   Musculoskeletal: Negative.   Neurological: Positive for weakness.  Endo/Heme/Allergies: Does not bruise/bleed easily.  Psychiatric/Behavioral: Negative.  The patient is not nervous/anxious.     As per HPI. Otherwise, a complete review of systems is negative.  PAST MEDICAL HISTORY: Past Medical History:  Diagnosis Date  . Arthritis   . Asthma   . Bronchitis   . CAD (coronary artery disease)    inferior MI 11/99 (in [place]) with PCI to [facility]. last [place] 2004 [facility]): EF 50%, patent RCA stent. no obstructive disease. last myoview 2008: EF 42%, inferior infarct, no ischemia.   . Cancer ([place])    CMML [place]  . COPD (chronic obstructive pulmonary disease) ([place])   . Cough    CHRONIC  . Diabetic autonomic neuropathy associated with type 2 diabetes mellitus ([place])  03/27/2015  . Diverticulosis 2002  . DM2 (diabetes mellitus, type 2) ([facility])    A1c 6.0% 12/11  . Dyspnea   . Edema   . GERD (gastroesophageal reflux disease)   . H/O wheezing   . HTN (hypertension)   . Hyperlipidemia   . Ischemic cardiomyopathy    mild with EF 42% on myoviews 2008.  . Medical non-compliance   . Myocardial infarction ([place])   . Nicotine dependence    chronic, active  . Pneumonia    in the past  . Seasonal allergic rhinitis   . Sleep apnea   . Smoker   . Spinal stenosis   . 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 Systolic heart failure secondary to coronary artery disease ([place]) 05/30/2012  . TIA (transient ischemic attack)    hx; now s/p PFO closure 2004 (in [place])  . Ulcer     PAST SURGICAL HISTORY: Past Surgical History:  Procedure Laterality Date  . BACK SURGERY    . CARDIAC CATHETERIZATION  11/99   CI per PMH  . CATARACT EXTRACTION W/PHACO Right 11/24/2016   Procedure: CATARACT EXTRACTION PHACO AND INTRAOCULAR LENS PLACEMENT (IOC);  Surgeon: Birder Robson, MD;  Location: [facility] ORS;  Service: Ophthalmology;  Laterality: Right;  [place] 1:04.3 AP% 22.3 CDE 14.35 Fluid pack lot # [id number]  . CATARACT EXTRACTION W/PHACO Left 12/29/2016   Procedure: CATARACT EXTRACTION PHACO AND INTRAOCULAR LENS PLACEMENT (IOC);  Surgeon: Birder Robson, MD;  Location: [facility] ORS;  Service: Ophthalmology;  Laterality: Left;  [place] 00:52 AP% 18.5 CDE 9.67 Fluid pack lot # [id number]  . CORONARY ANGIOPLASTY  STENT  . CORONARY ARTERY BYPASS GRAFT    . EYE SURGERY    . open heart surgery  2004   PFO repair    FAMILY HISTORY Family History  Problem Relation Age of Onset  . Coronary artery disease Unknown        family hx  . Breast cancer Unknown        1st egree relative <50       ADVANCED DIRECTIVES:    HEALTH MAINTENANCE: Social History  Substance Use Topics  . Smoking status: Current Every Day Smoker    Packs/day: 1.00    Years: 45.00    Types:  Cigarettes  . Smokeless tobacco: Never Used     Comment: 1 ppd +40 years  . Alcohol use 0.0 oz/week     Comment: beer weekly     Colonoscopy:  PAP:  Bone density:  Lipid panel:  No Known Allergies  Current Outpatient Prescriptions  Medication Sig Dispense Refill  . acetaminophen (TYLENOL) 500 MG tablet Take 1,000 mg by mouth daily as needed for moderate pain or headache.    . carvedilol (COREG) 3.125 MG tablet TAKE 1 TABLET (3.125 MG TOTAL) BY MOUTH 2 (TWO) TIMES DAILY. 180 tablet 1  . Cholecalciferol (VITAMIN D) 2000 units CAPS Take 2,000 Units by mouth every evening.    . gabapentin (NEURONTIN) 300 MG capsule Take 1 capsule (300 mg total) by mouth 2 (two) times daily. (Patient taking differently: Take 300 mg by mouth at bedtime. ) 180 capsule 3  . Glucosamine HCl 1000 MG TABS Take 1,000 mg by mouth every evening.    Marland Kitchen ipratropium-albuterol (DUONEB) 0.5-2.5 (3) MG/3ML SOLN TAKE 3 MLS BY NEBULIZATION EVERY 4 (FOUR) HOURS AS NEEDED. (Patient taking differently: Take 3 mLs by nebulization every 4 (four) hours as needed (shortness of breath). ) 540 mL 2  . Magnesium 250 MG TABS Take 250 mg by mouth every evening.    . metFORMIN (GLUCOPHAGE-XR) 500 MG 24 hr tablet TAKE 1 TABLET (500 MG TOTAL) BY MOUTH DAILY WITH BREAKFAST. 90 tablet 1  . omeprazole (PRILOSEC) 20 MG capsule Take 20 mg by mouth daily.    Marland Kitchen oxymetazoline (AFRIN) 0.05 % nasal spray Place 1 spray into both nostrils daily as needed for congestion.    . pseudoephedrine (SUDAFED) 30 MG tablet Take 30 mg by mouth daily as needed for congestion.     . SYMBICORT 160-4.5 MCG/ACT inhaler INHALE 2 PUFFS INTO THE LUNGS 2 (TWO) TIMES DAILY. 10.2 Inhaler 4  . tiZANidine (ZANAFLEX) 4 MG tablet TAKE 1 TABLET (4 MG TOTAL) BY MOUTH NIGHTLY. 90 tablet 3   No current facility-administered medications for this visit.     OBJECTIVE: Vitals:   02/04/17 1438  BP: 132/79  Pulse: 94  Resp: 20  Temp: 98.4 F (36.9 C)     Body mass index is  23.66 kg/m.    ECOG FS:0 - Asymptomatic  General: Well-developed, well-nourished, no acute distress. Eyes: Pink conjunctiva, anicteric sclera. HEENT: Scant lymphadenopathy on left neck, erythema noted in oropharynx. Lungs: Clear to auscultation bilaterally. Heart: Regular rate and rhythm. No rubs, murmurs, or gallops. Abdomen: Soft, nontender, nondistended. No organomegaly noted, normoactive bowel sounds. Musculoskeletal: No edema, cyanosis, or clubbing. Neuro: Alert, answering all questions appropriately. Cranial nerves grossly intact. Skin: No rashes or petechiae noted. Psych: Normal affect.   LAB RESULTS:  Lab Results  Component Value Date   NA 135 02/04/2017   K 4.1 02/04/2017   CL 98 (L) 02/04/2017  CO2 27 02/04/2017   GLUCOSE 113 (H) 02/04/2017   BUN 13 02/04/2017   CREATININE 0.80 02/04/2017   CALCIUM 9.4 02/04/2017   PROT 9.0 (H) 02/04/2017   ALBUMIN 3.6 02/04/2017   AST 33 02/04/2017   ALT 30 02/04/2017   ALKPHOS 72 02/04/2017   BILITOT 0.3 02/04/2017   GFRNONAA >60 02/04/2017   GFRAA >60 02/04/2017    Lab Results  Component Value Date   WBC 5.8 02/04/2017   NEUTROABS 1.3 (L) 02/04/2017   HGB 10.4 (L) 02/04/2017   HCT 30.1 (L) 02/04/2017   MCV 92.4 02/04/2017   PLT 216 02/04/2017     STUDIES: No results found.  ASSESSMENT: Bone marrow biopsy proven CMML-1.  PLAN:    1. CMML: Bone marrow biopsy completed on August 30, 2015 confirmed the diagnosis. Patient's absolute monocytosis has been greater than 1.0 and persistent since at least July 2014. Today's result Continues to be elevated at 1.9, but decreased over 1 week ago. The remainder of his blood work including BCR-ABL and ANA are negative or within normal limits. Previously, hepatitis C and HIV were also negative.  He will likely need treatment with azacitidine or decitabine at some point, but not at this moment. He has no evidence of end organ involvement. Repeat flow cytometry is pending. With CT  scan of the chest, abdomen, pelvis to further evaluate his weight loss. If unrevealing, patient will likely require repeat bone marrow biopsy. Return to clinic in 1 week to discuss the results and additional diagnostic planning if necessary.  2. Thrombocytopenia: Resolved.  Bone marrow biopsy as above. 3. Neutropenia: Mild. Also likely secondary to CML, monitor. 4. Pulmonary nodule: Repeat CT scan results from April 27, 2016 reviewed independently with resolution of pulmonary nodules. CT scans as above. 5. Weight loss: CT scans as above. Thyroid panel within normal limits. Patient will require bone marrow biopsy if scans unrevealing.   Approximately 30 minutes was spent in discussion of which greater than 50% was consultation.  Patient expressed understanding and was in agreement with this plan. He also understands that He can call clinic at any time with any questions, concerns, or complaints.     Lloyd Huger, MD   02/08/2017 8:32 AM

## 2017-02-04 ENCOUNTER — Inpatient Hospital Stay (HOSPITAL_BASED_OUTPATIENT_CLINIC_OR_DEPARTMENT_OTHER): Payer: 59 | Admitting: Oncology

## 2017-02-04 ENCOUNTER — Inpatient Hospital Stay: Payer: 59

## 2017-02-04 VITALS — BP 132/79 | HR 94 | Temp 98.4°F | Resp 20 | Wt 164.9 lb

## 2017-02-04 DIAGNOSIS — F17210 Nicotine dependence, cigarettes, uncomplicated: Secondary | ICD-10-CM

## 2017-02-04 DIAGNOSIS — R634 Abnormal weight loss: Secondary | ICD-10-CM | POA: Diagnosis not present

## 2017-02-04 DIAGNOSIS — C9310 Chronic myelomonocytic leukemia not having achieved remission: Secondary | ICD-10-CM | POA: Diagnosis not present

## 2017-02-04 LAB — COMPREHENSIVE METABOLIC PANEL
ALT: 30 U/L (ref 17–63)
AST: 33 U/L (ref 15–41)
Albumin: 3.6 g/dL (ref 3.5–5.0)
Alkaline Phosphatase: 72 U/L (ref 38–126)
Anion gap: 10 (ref 5–15)
BUN: 13 mg/dL (ref 6–20)
CHLORIDE: 98 mmol/L — AB (ref 101–111)
CO2: 27 mmol/L (ref 22–32)
CREATININE: 0.8 mg/dL (ref 0.61–1.24)
Calcium: 9.4 mg/dL (ref 8.9–10.3)
Glucose, Bld: 113 mg/dL — ABNORMAL HIGH (ref 65–99)
POTASSIUM: 4.1 mmol/L (ref 3.5–5.1)
SODIUM: 135 mmol/L (ref 135–145)
Total Bilirubin: 0.3 mg/dL (ref 0.3–1.2)
Total Protein: 9 g/dL — ABNORMAL HIGH (ref 6.5–8.1)

## 2017-02-04 LAB — CBC WITH DIFFERENTIAL/PLATELET
Band Neutrophils: 0 %
Basophils Absolute: 0.1 10*3/uL (ref 0–0.1)
Basophils Relative: 1 %
Blasts: 0 %
EOS PCT: 1 %
Eosinophils Absolute: 0.1 10*3/uL (ref 0–0.7)
HEMATOCRIT: 30.1 % — AB (ref 40.0–52.0)
Hemoglobin: 10.4 g/dL — ABNORMAL LOW (ref 13.0–18.0)
LYMPHS ABS: 2.4 10*3/uL (ref 1.0–3.6)
LYMPHS PCT: 43 %
MCH: 31.9 pg (ref 26.0–34.0)
MCHC: 34.6 g/dL (ref 32.0–36.0)
MCV: 92.4 fL (ref 80.0–100.0)
MYELOCYTES: 0 %
Metamyelocytes Relative: 2 %
Monocytes Absolute: 1.9 10*3/uL — ABNORMAL HIGH (ref 0.2–1.0)
Monocytes Relative: 32 %
NEUTROS PCT: 21 %
NRBC: 0 /100{WBCs}
Neutro Abs: 1.3 10*3/uL — ABNORMAL LOW (ref 1.4–6.5)
OTHER: 0 %
Platelets: 216 10*3/uL (ref 150–440)
Promyelocytes Absolute: 0 %
RBC: 3.26 MIL/uL — AB (ref 4.40–5.90)
RDW: 15.7 % — ABNORMAL HIGH (ref 11.5–14.5)
WBC: 5.8 10*3/uL (ref 3.8–10.6)

## 2017-02-04 LAB — LACTATE DEHYDROGENASE: LDH: 176 U/L (ref 98–192)

## 2017-02-04 NOTE — Progress Notes (Signed)
Patient here today regarding weight loss concerns. Patient states he has lost 45 lbs since November 2017. Patient also reports fatigue, weakness, feeling cold and shaking. Patient reports stabbing pain under his left arm that is intermittent. Patient reports decreased appetite.

## 2017-02-05 LAB — THYROID PANEL WITH TSH
Free Thyroxine Index: 1.9 (ref 1.2–4.9)
T3 UPTAKE RATIO: 28 % (ref 24–39)
T4, Total: 6.8 ug/dL (ref 4.5–12.0)
TSH: 1.38 u[IU]/mL (ref 0.450–4.500)

## 2017-02-05 LAB — HAPTOGLOBIN: HAPTOGLOBIN: 321 mg/dL — AB (ref 34–200)

## 2017-02-08 LAB — COMP PANEL: LEUKEMIA/LYMPHOMA

## 2017-02-17 ENCOUNTER — Other Ambulatory Visit: Payer: 59

## 2017-02-18 ENCOUNTER — Ambulatory Visit: Payer: 59 | Admitting: Oncology

## 2017-02-24 ENCOUNTER — Ambulatory Visit
Admission: RE | Admit: 2017-02-24 | Discharge: 2017-02-24 | Disposition: A | Discharge status: Home / Self Care | Payer: Medicare HMO | Source: Ambulatory Visit | Attending: Oncology | Admitting: Oncology

## 2017-02-24 DIAGNOSIS — I700 Atherosclerosis of aorta: Secondary | ICD-10-CM | POA: Insufficient documentation

## 2017-02-24 DIAGNOSIS — R590 Localized enlarged lymph nodes: Secondary | ICD-10-CM | POA: Insufficient documentation

## 2017-02-24 DIAGNOSIS — R918 Other nonspecific abnormal finding of lung field: Secondary | ICD-10-CM | POA: Diagnosis not present

## 2017-02-24 DIAGNOSIS — C9310 Chronic myelomonocytic leukemia not having achieved remission: Secondary | ICD-10-CM | POA: Insufficient documentation

## 2017-02-24 MED ORDER — IOPAMIDOL (ISOVUE-300) INJECTION 61%
100.0000 mL | Freq: Once | INTRAVENOUS | Status: AC | PRN
  Administered 2017-02-24: 100 mL via INTRAVENOUS

## 2017-02-28 NOTE — Progress Notes (Signed)
[name]  [phone] [phone]  ID: Tony Watson OB: Jan 26, 1951  MR#: [id number]  NID#:[id number]  Patient Care Team: Owens Loffler, MD as PCP - General  CHIEF COMPLAINT: Bone marrow biopsy proven CMML-1.  INTERVAL HISTORY: Patient returns to clinic today for follow-up and result from CT scan. He continues to have persistent weakness and fatigue. He has a poor appetite. He denies any fevers or night sweats. He has no neurologic complaints. He has no chest pain or shortness of breath. He denies any nausea, vomiting, constipation, or diarrhea. He has no urinary complaints. Patient offers no further specific complaints today.  REVIEW OF SYSTEMS:   Review of Systems  Constitutional: Positive for malaise/fatigue and weight loss. Negative for diaphoresis and fever.  HENT: Negative.  Negative for sore throat.   Respiratory: Negative for cough and shortness of breath.   Cardiovascular: Negative.  Negative for chest pain and leg swelling.  Gastrointestinal: Negative.  Negative for abdominal pain, nausea and vomiting.  Genitourinary: Negative.   Musculoskeletal: Negative.   Neurological: Positive for weakness.  Endo/Heme/Allergies: Does not bruise/bleed easily.  Psychiatric/Behavioral: Negative.  The patient is not nervous/anxious.     As per HPI. Otherwise, a complete review of systems is negative.  PAST MEDICAL HISTORY: Past Medical History:  Diagnosis Date  . Arthritis   . Asthma   . Bronchitis   . CAD (coronary artery disease)    inferior MI 11/99 (in [place]) with PCI to [facility]. last [place] 2004 [facility]): EF 50%, patent RCA stent. no obstructive disease. last myoview 2008: EF 42%, inferior infarct, no ischemia.   . Cancer ([place])    CMML [place]  . COPD (chronic obstructive pulmonary disease) ([place])   . Cough    CHRONIC  . Diabetic autonomic neuropathy associated with type 2 diabetes mellitus ([place]) 03/27/2015  . Diverticulosis 2002  . DM2  (diabetes mellitus, type 2) ([facility])    A1c 6.0% 12/11  . Dyspnea   . Edema   . GERD (gastroesophageal reflux disease)   . H/O wheezing   . HTN (hypertension)   . Hyperlipidemia   . Ischemic cardiomyopathy    mild with EF 42% on myoviews 2008.  . Medical non-compliance   . Myocardial infarction ([place])   . Nicotine dependence    chronic, active  . Pneumonia    in the past  . Seasonal allergic rhinitis   . Sleep apnea   . Smoker   . Spinal stenosis   . 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 Systolic heart failure secondary to coronary artery disease ([place]) 05/30/2012  . TIA (transient ischemic attack)    hx; now s/p PFO closure 2004 (in [place])  . Ulcer     PAST SURGICAL HISTORY: Past Surgical History:  Procedure Laterality Date  . BACK SURGERY    . CARDIAC CATHETERIZATION  11/99   CI per [facility]  . CATARACT EXTRACTION W/PHACO Right 11/24/2016   Procedure: CATARACT EXTRACTION PHACO AND INTRAOCULAR LENS PLACEMENT (IOC);  Surgeon: Birder Robson, MD;  Location: [facility] ORS;  Service: Ophthalmology;  Laterality: Right;  [place] 1:04.3 AP% 22.3 CDE 14.35 Fluid pack lot # [id number]  . CATARACT EXTRACTION W/PHACO Left 12/29/2016   Procedure: CATARACT EXTRACTION PHACO AND INTRAOCULAR LENS PLACEMENT (IOC);  Surgeon: Birder Robson, MD;  Location: [facility] ORS;  Service: Ophthalmology;  Laterality: Left;  [place] 00:52 AP% 18.5 CDE 9.67 Fluid pack lot # [id number]  . CORONARY ANGIOPLASTY     STENT  .  CORONARY ARTERY BYPASS GRAFT    . EYE SURGERY    . open heart surgery  2004   PFO repair    FAMILY HISTORY Family History  Problem Relation Age of Onset  . Coronary artery disease Unknown        family hx  . Breast cancer Unknown        1st egree relative <50       ADVANCED DIRECTIVES:    HEALTH MAINTENANCE: Social History  Substance Use Topics  . Smoking status: Current Every Day Smoker    Packs/day: 1.00    Years: 45.00    Types: Cigarettes  . Smokeless tobacco: Never Used      Comment: 1 ppd +40 years  . Alcohol use 0.0 oz/week     Comment: beer weekly     Colonoscopy:  PAP:  Bone density:  Lipid panel:  No Known Allergies  Current Outpatient Prescriptions  Medication Sig Dispense Refill  . acetaminophen (TYLENOL) 500 MG tablet Take 1,000 mg by mouth daily as needed for moderate pain or headache.    . carvedilol (COREG) 3.125 MG tablet TAKE 1 TABLET (3.125 MG TOTAL) BY MOUTH 2 (TWO) TIMES DAILY. 180 tablet 1  . Cholecalciferol (VITAMIN D) 2000 units CAPS Take 2,000 Units by mouth every evening.    . gabapentin (NEURONTIN) 300 MG capsule Take 1 capsule (300 mg total) by mouth 2 (two) times daily. (Patient taking differently: Take 300 mg by mouth at bedtime. ) 180 capsule 3  . Glucosamine HCl 1000 MG TABS Take 1,000 mg by mouth every evening.    Marland Kitchen ipratropium-albuterol (DUONEB) 0.5-2.5 (3) MG/3ML SOLN TAKE 3 MLS BY NEBULIZATION EVERY 4 (FOUR) HOURS AS NEEDED. (Patient taking differently: Take 3 mLs by nebulization every 4 (four) hours as needed (shortness of breath). ) 540 mL 2  . Magnesium 250 MG TABS Take 250 mg by mouth every evening.    . metFORMIN (GLUCOPHAGE-XR) 500 MG 24 hr tablet TAKE 1 TABLET (500 MG TOTAL) BY MOUTH DAILY WITH BREAKFAST. 90 tablet 1  . omeprazole (PRILOSEC) 20 MG capsule Take 20 mg by mouth daily.    Marland Kitchen oxymetazoline (AFRIN) 0.05 % nasal spray Place 1 spray into both nostrils daily as needed for congestion.    . pseudoephedrine (SUDAFED) 30 MG tablet Take 30 mg by mouth daily as needed for congestion.     . SYMBICORT 160-4.5 MCG/ACT inhaler INHALE 2 PUFFS INTO THE LUNGS 2 (TWO) TIMES DAILY. 10.2 Inhaler 4  . tiZANidine (ZANAFLEX) 4 MG tablet TAKE 1 TABLET (4 MG TOTAL) BY MOUTH NIGHTLY. 90 tablet 3  . megestrol (MEGACE) 40 MG tablet Take 1 tablet (40 mg total) by mouth daily. 30 tablet 0   No current facility-administered medications for this visit.     OBJECTIVE: Vitals:   03/01/17 1451  BP: 123/75  Pulse: 98  Resp: 18  Temp:  98.7 F (37.1 C)     Body mass index is 22.83 kg/m.    ECOG FS:0 - Asymptomatic  General: Well-developed, well-nourished, no acute distress. Eyes: Pink conjunctiva, anicteric sclera. HEENT: Scant lymphadenopathy on left neck, erythema noted in oropharynx. Lungs: Clear to auscultation bilaterally. Heart: Regular rate and rhythm. No rubs, murmurs, or gallops. Abdomen: Soft, nontender, nondistended. No organomegaly noted, normoactive bowel sounds. Musculoskeletal: No edema, cyanosis, or clubbing. Neuro: Alert, answering all questions appropriately. Cranial nerves grossly intact. Skin: No rashes or petechiae noted. Psych: Normal affect.   LAB RESULTS:  Lab Results  Component Value Date  NA 135 02/04/2017   K 4.1 02/04/2017   CL 98 (L) 02/04/2017   CO2 27 02/04/2017   GLUCOSE 113 (H) 02/04/2017   BUN 13 02/04/2017   CREATININE 0.80 02/04/2017   CALCIUM 9.4 02/04/2017   PROT 9.0 (H) 02/04/2017   ALBUMIN 3.6 02/04/2017   AST 33 02/04/2017   ALT 30 02/04/2017   ALKPHOS 72 02/04/2017   BILITOT 0.3 02/04/2017   GFRNONAA >60 02/04/2017   GFRAA >60 02/04/2017    Lab Results  Component Value Date   WBC 5.8 02/04/2017   NEUTROABS 1.3 (L) 02/04/2017   HGB 10.4 (L) 02/04/2017   HCT 30.1 (L) 02/04/2017   MCV 92.4 02/04/2017   PLT 216 02/04/2017     STUDIES: Ct Chest W Contrast  Result Date: 02/24/2017 CLINICAL DATA:  Weight loss over the last 6 months. Persistent weakness and fatigue. EXAM: CT CHEST, ABDOMEN, AND PELVIS WITH CONTRAST TECHNIQUE: Multidetector CT imaging of the chest, abdomen and pelvis was performed following the standard protocol during bolus administration of intravenous contrast. CONTRAST:  150m ISOVUE-300 IOPAMIDOL (ISOVUE-300) INJECTION 61% COMPARISON:  Chest CT 04/27/2016.  Abdomen and pelvis CT 02/05/2013. FINDINGS: CT CHEST FINDINGS Cardiovascular: The heart size is normal. No pericardial effusion. Coronary artery calcification is noted. Atherosclerotic  calcification is noted in the wall of the thoracic aorta. Mediastinum/Nodes: 10 mm short axis prevascular lymph node increased from 6 mm previously. 13 mm short axis precarinal lymph node increased from 10 mm previously. Small lymph nodes are evident in each hilum. The esophagus has normal imaging features. There is no axillary lymphadenopathy. Lungs/Pleura: Widespread interstitial and central peribronchovascular micro nodularity is noted bilaterally with an upper lobe predominance and near confluence in some regions. Some of the nodules appear cavitated. Relative sparing noted the lung bases. No pleural effusion. Musculoskeletal: Bone windows reveal no worrisome lytic or sclerotic osseous lesions. CT ABDOMEN PELVIS FINDINGS Hepatobiliary: No focal abnormality within the liver parenchyma. There is no evidence for gallstones, gallbladder wall thickening, or pericholecystic fluid. No intrahepatic or extrahepatic biliary dilation. Pancreas: No focal mass lesion. No dilatation of the main duct. No intraparenchymal cyst. No peripancreatic edema. Spleen: No splenomegaly. No focal mass lesion. Adrenals/Urinary Tract: No adrenal nodule or mass. Kidneys are unremarkable. No evidence for hydroureter. The urinary bladder appears normal for the degree of distention. Stomach/Bowel: Stomach is nondistended. No gastric wall thickening. No evidence of outlet obstruction. Duodenum is normally positioned as is the ligament of Treitz. No small bowel wall thickening. No small bowel dilatation. The terminal ileum is normal. The appendix is normal. Diverticular changes are noted in the left colon without evidence of diverticulitis. Vascular/Lymphatic: There is abdominal aortic atherosclerosis without aneurysm. There is no gastrohepatic or hepatoduodenal ligament lymphadenopathy. No intraperitoneal or retroperitoneal lymphadenopathy. No pelvic sidewall lymphadenopathy. Reproductive: The prostate gland and seminal vesicles have normal  imaging features. Other: No intraperitoneal free fluid. Musculoskeletal: Bone windows reveal no worrisome lytic or sclerotic osseous lesions. IMPRESSION: 1. Interval progression of the fairly marked peribronchovascular and centrilobular micro nodularity in the lungs when comparing to a neck CT of 12/03/2016. Disease distribution shows an upper lung predominance with relative sparing of the lung bases. Some of these tiny ill-defined nodules show central cavitation. Atypical infection considered the most likely etiology for these findings. 2. Mild mediastinal lymphadenopathy, slightly progressed since 04/27/2016, potentially reactive given the findings in the lungs. 3. Unremarkable CT scan of the abdomen and pelvis. 4.  Aortic Atherosclerois (ICD10-170.0) Electronically Signed   By: EMisty Stanley  M.D.   On: 02/24/2017 12:50   Ct Abdomen Pelvis W Contrast  Result Date: 02/24/2017 CLINICAL DATA:  Weight loss over the last 6 months. Persistent weakness and fatigue. EXAM: CT CHEST, ABDOMEN, AND PELVIS WITH CONTRAST TECHNIQUE: Multidetector CT imaging of the chest, abdomen and pelvis was performed following the standard protocol during bolus administration of intravenous contrast. CONTRAST:  116m ISOVUE-300 IOPAMIDOL (ISOVUE-300) INJECTION 61% COMPARISON:  Chest CT 04/27/2016.  Abdomen and pelvis CT 02/05/2013. FINDINGS: CT CHEST FINDINGS Cardiovascular: The heart size is normal. No pericardial effusion. Coronary artery calcification is noted. Atherosclerotic calcification is noted in the wall of the thoracic aorta. Mediastinum/Nodes: 10 mm short axis prevascular lymph node increased from 6 mm previously. 13 mm short axis precarinal lymph node increased from 10 mm previously. Small lymph nodes are evident in each hilum. The esophagus has normal imaging features. There is no axillary lymphadenopathy. Lungs/Pleura: Widespread interstitial and central peribronchovascular micro nodularity is noted bilaterally with an  upper lobe predominance and near confluence in some regions. Some of the nodules appear cavitated. Relative sparing noted the lung bases. No pleural effusion. Musculoskeletal: Bone windows reveal no worrisome lytic or sclerotic osseous lesions. CT ABDOMEN PELVIS FINDINGS Hepatobiliary: No focal abnormality within the liver parenchyma. There is no evidence for gallstones, gallbladder wall thickening, or pericholecystic fluid. No intrahepatic or extrahepatic biliary dilation. Pancreas: No focal mass lesion. No dilatation of the main duct. No intraparenchymal cyst. No peripancreatic edema. Spleen: No splenomegaly. No focal mass lesion. Adrenals/Urinary Tract: No adrenal nodule or mass. Kidneys are unremarkable. No evidence for hydroureter. The urinary bladder appears normal for the degree of distention. Stomach/Bowel: Stomach is nondistended. No gastric wall thickening. No evidence of outlet obstruction. Duodenum is normally positioned as is the ligament of Treitz. No small bowel wall thickening. No small bowel dilatation. The terminal ileum is normal. The appendix is normal. Diverticular changes are noted in the left colon without evidence of diverticulitis. Vascular/Lymphatic: There is abdominal aortic atherosclerosis without aneurysm. There is no gastrohepatic or hepatoduodenal ligament lymphadenopathy. No intraperitoneal or retroperitoneal lymphadenopathy. No pelvic sidewall lymphadenopathy. Reproductive: The prostate gland and seminal vesicles have normal imaging features. Other: No intraperitoneal free fluid. Musculoskeletal: Bone windows reveal no worrisome lytic or sclerotic osseous lesions. IMPRESSION: 1. Interval progression of the fairly marked peribronchovascular and centrilobular micro nodularity in the lungs when comparing to a neck CT of 12/03/2016. Disease distribution shows an upper lung predominance with relative sparing of the lung bases. Some of these tiny ill-defined nodules show central  cavitation. Atypical infection considered the most likely etiology for these findings. 2. Mild mediastinal lymphadenopathy, slightly progressed since 04/27/2016, potentially reactive given the findings in the lungs. 3. Unremarkable CT scan of the abdomen and pelvis. 4.  Aortic Atherosclerois (ICD10-170.0) Electronically Signed   By: EMisty StanleyM.D.   On: 02/24/2017 12:50    ASSESSMENT: Bone marrow biopsy proven CMML-1.  PLAN:    1. CMML: Bone marrow biopsy completed on August 30, 2015 confirmed the diagnosis. Patient's absolute monocytosis has been greater than 1.0 and persistent since at least July 2014. He continues to have elevated at 1.9, but decreased over 1 week ago. The remainder of his blood work including BCR-ABL and ANA are negative or within normal limits. Previously, hepatitis C and HIV were also negative.  He will likely need treatment with azacitidine or decitabine at some point, but not at this moment. He has no evidence of end organ involvement. CT scan reveals stable lung nodules and no  indication for the patients unintentional weight loss and fatigue and weakness.. Dr. Grayland Ormond recommends bone marrow biopsy. Schedule bone marrow ASAP and then see Dr. Grayland Ormond back a few days after.  2. Thrombocytopenia: Resolved. 3. Neutropenia: Resolved. 4. Pulmonary nodule: Slightly larger but stable from recent CT scan compared to the scan in 04/2016..  5. Weight loss: CT scans as above. Thyroid panel within normal limits. Encouraged supplements such as Ensure and Boost. RX Megace 40 mg daily.   Approximately 30 minutes was spent in discussion of which greater than 50% was consultation.  Patient expressed understanding and was in agreement with this plan. He also understands that He can call clinic at any time with any questions, concerns, or complaints.    Jacquelin Hawking, NP   03/02/2017 3:14 PM

## 2017-03-01 ENCOUNTER — Inpatient Hospital Stay: Payer: Medicare HMO | Attending: Oncology | Admitting: Oncology

## 2017-03-01 VITALS — BP 123/75 | HR 98 | Temp 98.7°F | Resp 18 | Wt 159.1 lb

## 2017-03-01 DIAGNOSIS — I5020 Unspecified systolic (congestive) heart failure: Secondary | ICD-10-CM | POA: Insufficient documentation

## 2017-03-01 DIAGNOSIS — F17210 Nicotine dependence, cigarettes, uncomplicated: Secondary | ICD-10-CM | POA: Diagnosis not present

## 2017-03-01 DIAGNOSIS — I700 Atherosclerosis of aorta: Secondary | ICD-10-CM | POA: Insufficient documentation

## 2017-03-01 DIAGNOSIS — K219 Gastro-esophageal reflux disease without esophagitis: Secondary | ICD-10-CM | POA: Diagnosis not present

## 2017-03-01 DIAGNOSIS — E785 Hyperlipidemia, unspecified: Secondary | ICD-10-CM | POA: Diagnosis not present

## 2017-03-01 DIAGNOSIS — Z951 Presence of aortocoronary bypass graft: Secondary | ICD-10-CM | POA: Insufficient documentation

## 2017-03-01 DIAGNOSIS — C9310 Chronic myelomonocytic leukemia not having achieved remission: Secondary | ICD-10-CM

## 2017-03-01 DIAGNOSIS — Z79899 Other long term (current) drug therapy: Secondary | ICD-10-CM | POA: Diagnosis not present

## 2017-03-01 DIAGNOSIS — G4730 Sleep apnea, unspecified: Secondary | ICD-10-CM | POA: Diagnosis not present

## 2017-03-01 DIAGNOSIS — J449 Chronic obstructive pulmonary disease, unspecified: Secondary | ICD-10-CM | POA: Diagnosis not present

## 2017-03-01 DIAGNOSIS — Z7984 Long term (current) use of oral hypoglycemic drugs: Secondary | ICD-10-CM | POA: Diagnosis not present

## 2017-03-01 DIAGNOSIS — I252 Old myocardial infarction: Secondary | ICD-10-CM | POA: Insufficient documentation

## 2017-03-01 DIAGNOSIS — I110 Hypertensive heart disease with heart failure: Secondary | ICD-10-CM | POA: Diagnosis not present

## 2017-03-01 DIAGNOSIS — I255 Ischemic cardiomyopathy: Secondary | ICD-10-CM | POA: Diagnosis not present

## 2017-03-01 DIAGNOSIS — E1143 Type 2 diabetes mellitus with diabetic autonomic (poly)neuropathy: Secondary | ICD-10-CM | POA: Insufficient documentation

## 2017-03-01 DIAGNOSIS — Z8673 Personal history of transient ischemic attack (TIA), and cerebral infarction without residual deficits: Secondary | ICD-10-CM | POA: Insufficient documentation

## 2017-03-01 DIAGNOSIS — Z955 Presence of coronary angioplasty implant and graft: Secondary | ICD-10-CM | POA: Diagnosis not present

## 2017-03-01 MED ORDER — MEGESTROL ACETATE 40 MG PO TABS: 40.0000 mg | ORAL_TABLET | Freq: Every day | ORAL | 0 refills | Status: DC

## 2017-03-05 ENCOUNTER — Other Ambulatory Visit: Payer: Self-pay | Admitting: Radiology

## 2017-03-08 ENCOUNTER — Ambulatory Visit
Admission: RE | Admit: 2017-03-08 | Discharge: 2017-03-08 | Disposition: A | Discharge status: Home / Self Care | Payer: Medicare HMO | Source: Ambulatory Visit | Attending: Oncology | Admitting: Oncology

## 2017-03-08 ENCOUNTER — Other Ambulatory Visit (HOSPITAL_COMMUNITY)
Admission: RE | Admit: 2017-03-08 | Discharge: 2017-03-08 | Disposition: A | Discharge status: Home / Self Care | Payer: Medicare HMO | Source: Ambulatory Visit | Attending: Oncology | Admitting: Oncology

## 2017-03-08 DIAGNOSIS — Z9221 Personal history of antineoplastic chemotherapy: Secondary | ICD-10-CM | POA: Insufficient documentation

## 2017-03-08 DIAGNOSIS — Z7984 Long term (current) use of oral hypoglycemic drugs: Secondary | ICD-10-CM | POA: Diagnosis not present

## 2017-03-08 DIAGNOSIS — R5382 Chronic fatigue, unspecified: Secondary | ICD-10-CM | POA: Insufficient documentation

## 2017-03-08 DIAGNOSIS — F17210 Nicotine dependence, cigarettes, uncomplicated: Secondary | ICD-10-CM | POA: Diagnosis not present

## 2017-03-08 DIAGNOSIS — C9310 Chronic myelomonocytic leukemia not having achieved remission: Secondary | ICD-10-CM | POA: Insufficient documentation

## 2017-03-08 DIAGNOSIS — Z79899 Other long term (current) drug therapy: Secondary | ICD-10-CM | POA: Insufficient documentation

## 2017-03-08 LAB — BASIC METABOLIC PANEL
Anion gap: 11 (ref 5–15)
BUN: 10 mg/dL (ref 6–20)
CO2: 24 mmol/L (ref 22–32)
Calcium: 9.4 mg/dL (ref 8.9–10.3)
Chloride: 101 mmol/L (ref 101–111)
Creatinine, Ser: 0.83 mg/dL (ref 0.61–1.24)
GFR calc Af Amer: >60 mL/min (ref >60)
GFR calc non Af Amer: >60 mL/min (ref >60)
GLUCOSE: 118 mg/dL — AB (ref 65–99)
Potassium: 4.1 mmol/L (ref 3.5–5.1)
Sodium: 136 mmol/L (ref 135–145)

## 2017-03-08 LAB — CBC WITH DIFFERENTIAL/PLATELET
BASOS ABS: 0 10*3/uL (ref 0–0.1)
BASOS PCT: 0 %
EOS ABS: 0 10*3/uL (ref 0–0.7)
Eosinophils Relative: 0 %
HEMATOCRIT: 31.3 % — AB (ref 40.0–52.0)
HEMOGLOBIN: 10.7 g/dL — AB (ref 13.0–18.0)
LYMPHS ABS: 1.3 10*3/uL (ref 1.0–3.6)
LYMPHS PCT: 20 %
MCH: 32.2 pg (ref 26.0–34.0)
MCHC: 34.3 g/dL (ref 32.0–36.0)
MCV: 93.8 fL (ref 80.0–100.0)
Monocytes Absolute: 2.8 10*3/uL — ABNORMAL HIGH (ref 0.2–1.0)
Monocytes Relative: 42 %
Neutro Abs: 2.5 10*3/uL (ref 1.4–6.5)
Neutrophils Relative %: 38 %
Platelets: 204 10*3/uL (ref 150–440)
RBC: 3.34 MIL/uL — ABNORMAL LOW (ref 4.40–5.90)
RDW: 16 % — AB (ref 11.5–14.5)
WBC: 6.6 10*3/uL (ref 3.8–10.6)

## 2017-03-08 LAB — PROTIME-INR
INR: 1.31
PROTHROMBIN TIME: 16.4 s — AB (ref 11.4–15.2)

## 2017-03-08 MED ORDER — MIDAZOLAM HCL 5 MG/5ML IJ SOLN
INTRAMUSCULAR | Status: AC | PRN
  Administered 2017-03-08 (×2): 1 mg via INTRAVENOUS

## 2017-03-08 MED ORDER — FENTANYL CITRATE (PF) 100 MCG/2ML IJ SOLN
INTRAMUSCULAR | Status: AC | PRN
  Administered 2017-03-08: 50 ug via INTRAVENOUS

## 2017-03-08 MED ORDER — FENTANYL CITRATE (PF) 100 MCG/2ML IJ SOLN
INTRAMUSCULAR | Status: AC
  Filled 2017-03-08: qty 2

## 2017-03-08 MED ORDER — SODIUM CHLORIDE 0.9 % IV SOLN
INTRAVENOUS | Status: DC
  Administered 2017-03-08: 09:00:00 via INTRAVENOUS

## 2017-03-08 MED ORDER — MIDAZOLAM HCL 5 MG/5ML IJ SOLN
INTRAMUSCULAR | Status: AC
  Filled 2017-03-08: qty 5

## 2017-03-08 MED ORDER — HEPARIN SOD (PORK) LOCK FLUSH 100 UNIT/ML IV SOLN
INTRAVENOUS | Status: AC
  Filled 2017-03-08: qty 5

## 2017-03-08 NOTE — Procedures (Signed)
CMML  S/p CT RT ILIAC BM ASP AND CORE BX  NO COMP STABLE FULL REPORT IN PACS PATH PENDNIG EBL 0

## 2017-03-08 NOTE — H&P (Signed)
Chief Complaint: CMML  Referring Physician(s): Finnegan,Timothy J    History of Present Illness: Tony Watson is a 66 y.o. male with CMML s/p chemo with chronic fatigue.  Needs repeat BM BX to reassess CMML status. No current complaints.  No chest pain, SOB, or abd pain.  Past Medical History:  Diagnosis Date  . Arthritis   . Asthma   . Bronchitis   . CAD (coronary artery disease)    inferior MI 11/99 (in [place]) with PCI to [facility]. last [place] 2004 [facility]): EF 50%, patent RCA stent. no obstructive disease. last myoview 2008: EF 42%, inferior infarct, no ischemia.   . Cancer ([place])    CMML [place]  . COPD (chronic obstructive pulmonary disease) ([place])   . Cough    CHRONIC  . Diabetic autonomic neuropathy associated with type 2 diabetes mellitus ([place]) 03/27/2015  . Diverticulosis 2002  . DM2 (diabetes mellitus, type 2) ([facility])    A1c 6.0% 12/11  . Dyspnea   . Edema   . GERD (gastroesophageal reflux disease)   . H/O wheezing   . HTN (hypertension)   . Hyperlipidemia   . Ischemic cardiomyopathy    mild with EF 42% on myoviews 2008.  . Medical non-compliance   . Myocardial infarction ([place])   . Nicotine dependence    chronic, active  . Pneumonia    in the past  . Seasonal allergic rhinitis   . Sleep apnea   . Smoker   . Spinal stenosis   . 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 Systolic heart failure secondary to coronary artery disease ([place]) 05/30/2012  . TIA (transient ischemic attack)    hx; now s/p PFO closure 2004 (in [place])  . Ulcer     Past Surgical History:  Procedure Laterality Date  . BACK SURGERY     patient denies-just lumbar punctures  . CARDIAC CATHETERIZATION  11/99   CI per PMH  . CATARACT EXTRACTION W/PHACO Right 11/24/2016   Procedure: CATARACT EXTRACTION PHACO AND INTRAOCULAR LENS PLACEMENT (IOC);  Surgeon: Birder Robson, MD;  Location: [facility] ORS;  Service: Ophthalmology;  Laterality: Right;  [place] 1:04.3 AP% 22.3 CDE 14.35 Fluid pack  lot # [id number]  . CATARACT EXTRACTION W/PHACO Left 12/29/2016   Procedure: CATARACT EXTRACTION PHACO AND INTRAOCULAR LENS PLACEMENT (IOC);  Surgeon: Birder Robson, MD;  Location: [facility] ORS;  Service: Ophthalmology;  Laterality: Left;  [place] 00:52 AP% 18.5 CDE 9.67 Fluid pack lot # [id number]  . CORONARY ANGIOPLASTY     STENT  . CORONARY ARTERY BYPASS GRAFT    . EYE SURGERY    . open heart surgery  2004   PFO repair    Allergies: Patient has no known allergies.  Medications: Prior to Admission medications   Medication Sig Start Date End Date Taking? Authorizing Provider  carvedilol (COREG) 3.125 MG tablet TAKE 1 TABLET (3.125 MG TOTAL) BY MOUTH 2 (TWO) TIMES DAILY. 09/12/16  Yes Copland, Frederico Hamman, MD  Cholecalciferol (VITAMIN D) 2000 units CAPS Take 2,000 Units by mouth every evening.   Yes [provider]  gabapentin (NEURONTIN) 300 MG capsule Take 1 capsule (300 mg total) by mouth 2 (two) times daily. Patient taking differently: Take 300 mg by mouth at bedtime.  07/22/16  Yes Copland, Frederico Hamman, MD  Glucosamine HCl 1000 MG TABS Take 1,000 mg by mouth every evening.   Yes [provider]  ipratropium-albuterol (DUONEB) 0.5-2.5 (3) MG/3ML SOLN TAKE 3 MLS BY NEBULIZATION EVERY 4 (FOUR)  HOURS AS NEEDED. Patient taking differently: Take 3 mLs by nebulization every 4 (four) hours as needed (shortness of breath).  10/28/16  Yes Copland, Frederico Hamman, MD  Magnesium 250 MG TABS Take 250 mg by mouth every evening.   Yes [provider]  megestrol (MEGACE) 40 MG tablet Take 1 tablet (40 mg total) by mouth daily. 03/01/17  Yes Burns, Wandra Feinstein, NP  metFORMIN (GLUCOPHAGE-XR) 500 MG 24 hr tablet TAKE 1 TABLET (500 MG TOTAL) BY MOUTH DAILY WITH BREAKFAST. 09/12/16  Yes Copland, Frederico Hamman, MD  omeprazole (PRILOSEC) 20 MG capsule Take 20 mg by mouth daily.   Yes [provider]  acetaminophen (TYLENOL) 500 MG tablet Take 1,000 mg by mouth daily as needed for moderate pain or  headache.    [provider]  oxymetazoline (AFRIN) 0.05 % nasal spray Place 1 spray into both nostrils daily as needed for congestion.    [provider]  pseudoephedrine (SUDAFED) 30 MG tablet Take 30 mg by mouth daily as needed for congestion.     [provider]  SYMBICORT 160-4.5 MCG/ACT inhaler INHALE 2 PUFFS INTO THE LUNGS 2 (TWO) TIMES DAILY. 09/21/16   Copland, Frederico Hamman, MD  tiZANidine (ZANAFLEX) 4 MG tablet TAKE 1 TABLET (4 MG TOTAL) BY MOUTH NIGHTLY. 03/25/16   Owens Loffler, MD     Family History  Problem Relation Age of Onset  . Coronary artery disease Unknown        family hx  . Breast cancer Unknown        1st egree relative <50    Social History   Social History  . Marital status: Married    Spouse name: N/A  . Number of children: N/A  . Years of education: N/A   Social History Main Topics  . Smoking status: Current Every Day Smoker    Packs/day: 1.00    Years: 45.00    Types: Cigarettes  . Smokeless tobacco: Never Used     Comment: 1 ppd +40 years  . Alcohol use 0.0 oz/week     Comment: beer weekly  . Drug use: No  . Sexual activity: Not Asked   Other Topics Concern  . None   Social History Narrative   Married, 1 daughter. 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 Lives in [place], [place]    ECOG Status: 1 - Symptomatic but completely ambulatory  Review of Systems: A 12 point ROS discussed and pertinent positives are indicated in the HPI above.  All other systems are negative.  Review of Systems  Vital Signs: BP (!) 141/67   Pulse (!) 102   Temp 98.6 F (37 C) (Oral)   Resp 18   Ht 5\' 11"  (1.803 m)   SpO2 100%   Physical Exam  Constitutional: He is oriented to person, place, and time. He appears well-developed and well-nourished. No distress.  Eyes: Conjunctivae are normal. No scleral icterus.  Cardiovascular: Normal rate and regular rhythm.   Pulmonary/Chest: Effort normal and breath sounds  normal.  Abdominal: Soft. Bowel sounds are normal.  Musculoskeletal: Normal range of motion. He exhibits no edema.  Neurological: He is alert and oriented to person, place, and time.  Skin: Skin is warm and dry. He is not diaphoretic.  Psychiatric: He has a normal mood and affect. His behavior is normal.    Mallampati Score:    1 Imaging: Ct Chest W Contrast  Result Date: 02/24/2017 CLINICAL DATA:  Weight loss over the last 6 months. Persistent  weakness and fatigue. EXAM: CT CHEST, ABDOMEN, AND PELVIS WITH CONTRAST TECHNIQUE: Multidetector CT imaging of the chest, abdomen and pelvis was performed following the standard protocol during bolus administration of intravenous contrast. CONTRAST:  158mL ISOVUE-300 IOPAMIDOL (ISOVUE-300) INJECTION 61% COMPARISON:  Chest CT 04/27/2016.  Abdomen and pelvis CT 02/05/2013. FINDINGS: CT CHEST FINDINGS Cardiovascular: The heart size is normal. No pericardial effusion. Coronary artery calcification is noted. Atherosclerotic calcification is noted in the wall of the thoracic aorta. Mediastinum/Nodes: 10 mm short axis prevascular lymph node increased from 6 mm previously. 13 mm short axis precarinal lymph node increased from 10 mm previously. Small lymph nodes are evident in each hilum. The esophagus has normal imaging features. There is no axillary lymphadenopathy. Lungs/Pleura: Widespread interstitial and central peribronchovascular micro nodularity is noted bilaterally with an upper lobe predominance and near confluence in some regions. Some of the nodules appear cavitated. Relative sparing noted the lung bases. No pleural effusion. Musculoskeletal: Bone windows reveal no worrisome lytic or sclerotic osseous lesions. CT ABDOMEN PELVIS FINDINGS Hepatobiliary: No focal abnormality within the liver parenchyma. There is no evidence for gallstones, gallbladder wall thickening, or pericholecystic fluid. No intrahepatic or extrahepatic biliary dilation. Pancreas: No focal  mass lesion. No dilatation of the main duct. No intraparenchymal cyst. No peripancreatic edema. Spleen: No splenomegaly. No focal mass lesion. Adrenals/Urinary Tract: No adrenal nodule or mass. Kidneys are unremarkable. No evidence for hydroureter. The urinary bladder appears normal for the degree of distention. Stomach/Bowel: Stomach is nondistended. No gastric wall thickening. No evidence of outlet obstruction. Duodenum is normally positioned as is the ligament of Treitz. No small bowel wall thickening. No small bowel dilatation. The terminal ileum is normal. The appendix is normal. Diverticular changes are noted in the left colon without evidence of diverticulitis. Vascular/Lymphatic: There is abdominal aortic atherosclerosis without aneurysm. There is no gastrohepatic or hepatoduodenal ligament lymphadenopathy. No intraperitoneal or retroperitoneal lymphadenopathy. No pelvic sidewall lymphadenopathy. Reproductive: The prostate gland and seminal vesicles have normal imaging features. Other: No intraperitoneal free fluid. Musculoskeletal: Bone windows reveal no worrisome lytic or sclerotic osseous lesions. IMPRESSION: 1. Interval progression of the fairly marked peribronchovascular and centrilobular micro nodularity in the lungs when comparing to a neck CT of 12/03/2016. Disease distribution shows an upper lung predominance with relative sparing of the lung bases. Some of these tiny ill-defined nodules show central cavitation. Atypical infection considered the most likely etiology for these findings. 2. Mild mediastinal lymphadenopathy, slightly progressed since 04/27/2016, potentially reactive given the findings in the lungs. 3. Unremarkable CT scan of the abdomen and pelvis. 4.  Aortic Atherosclerois (ICD10-170.0) Electronically Signed   By: Misty Stanley M.D.   On: 02/24/2017 12:50   Ct Abdomen Pelvis W Contrast  Result Date: 02/24/2017 CLINICAL DATA:  Weight loss over the last 6 months. Persistent weakness  and fatigue. EXAM: CT CHEST, ABDOMEN, AND PELVIS WITH CONTRAST TECHNIQUE: Multidetector CT imaging of the chest, abdomen and pelvis was performed following the standard protocol during bolus administration of intravenous contrast. CONTRAST:  143mL ISOVUE-300 IOPAMIDOL (ISOVUE-300) INJECTION 61% COMPARISON:  Chest CT 04/27/2016.  Abdomen and pelvis CT 02/05/2013. FINDINGS: CT CHEST FINDINGS Cardiovascular: The heart size is normal. No pericardial effusion. Coronary artery calcification is noted. Atherosclerotic calcification is noted in the wall of the thoracic aorta. Mediastinum/Nodes: 10 mm short axis prevascular lymph node increased from 6 mm previously. 13 mm short axis precarinal lymph node increased from 10 mm previously. Small lymph nodes are evident in each hilum. The esophagus has normal imaging  features. There is no axillary lymphadenopathy. Lungs/Pleura: Widespread interstitial and central peribronchovascular micro nodularity is noted bilaterally with an upper lobe predominance and near confluence in some regions. Some of the nodules appear cavitated. Relative sparing noted the lung bases. No pleural effusion. Musculoskeletal: Bone windows reveal no worrisome lytic or sclerotic osseous lesions. CT ABDOMEN PELVIS FINDINGS Hepatobiliary: No focal abnormality within the liver parenchyma. There is no evidence for gallstones, gallbladder wall thickening, or pericholecystic fluid. No intrahepatic or extrahepatic biliary dilation. Pancreas: No focal mass lesion. No dilatation of the main duct. No intraparenchymal cyst. No peripancreatic edema. Spleen: No splenomegaly. No focal mass lesion. Adrenals/Urinary Tract: No adrenal nodule or mass. Kidneys are unremarkable. No evidence for hydroureter. The urinary bladder appears normal for the degree of distention. Stomach/Bowel: Stomach is nondistended. No gastric wall thickening. No evidence of outlet obstruction. Duodenum is normally positioned as is the ligament of  Treitz. No small bowel wall thickening. No small bowel dilatation. The terminal ileum is normal. The appendix is normal. Diverticular changes are noted in the left colon without evidence of diverticulitis. Vascular/Lymphatic: There is abdominal aortic atherosclerosis without aneurysm. There is no gastrohepatic or hepatoduodenal ligament lymphadenopathy. No intraperitoneal or retroperitoneal lymphadenopathy. No pelvic sidewall lymphadenopathy. Reproductive: The prostate gland and seminal vesicles have normal imaging features. Other: No intraperitoneal free fluid. Musculoskeletal: Bone windows reveal no worrisome lytic or sclerotic osseous lesions. IMPRESSION: 1. Interval progression of the fairly marked peribronchovascular and centrilobular micro nodularity in the lungs when comparing to a neck CT of 12/03/2016. Disease distribution shows an upper lung predominance with relative sparing of the lung bases. Some of these tiny ill-defined nodules show central cavitation. Atypical infection considered the most likely etiology for these findings. 2. Mild mediastinal lymphadenopathy, slightly progressed since 04/27/2016, potentially reactive given the findings in the lungs. 3. Unremarkable CT scan of the abdomen and pelvis. 4.  Aortic Atherosclerois (ICD10-170.0) Electronically Signed   By: Misty Stanley M.D.   On: 02/24/2017 12:50    Labs:  CBC:  Recent Labs  10/27/16 1400 12/03/16 0933 01/27/17 1005 02/04/17 1458  WBC 7.8 6.2 5.8 5.8  HGB 11.9* 10.7* 10.4* 10.4*  HCT 33.8* 30.7* 29.7* 30.1*  PLT 179 156 205 216    COAGS:  Recent Labs  12/03/16 0933  INR 1.23  APTT 39*    BMP:  Recent Labs  07/16/16 0844 12/03/16 0915 02/04/17 1458  NA 140  --  135  K 4.8  --  4.1  CL 103  --  98*  CO2 29  --  27  GLUCOSE 122*  --  113*  BUN 10  --  13  CALCIUM 9.2  --  9.4  CREATININE 0.88 0.70 0.80  GFRNONAA  --   --  >60  GFRAA  --   --  >60    LIVER FUNCTION TESTS:  Recent Labs   07/16/16 0844 02/04/17 1458  BILITOT 0.4 0.3  AST 24 33  ALT 24 30  ALKPHOS 46 72  PROT 7.5 9.0*  ALBUMIN 4.2 3.6    TUMOR MARKERS: No results for input(s): AFPTM, CEA, CA199, CHROMGRNA in the last 8760 hours.  Assessment and Plan:  CMML, plan for repeat CT BM asp and core to assess status.  S/p chemo  Risks and Benefits discussed with the patient including, but not limited to bleeding, infection, damage to adjacent structures or low yield requiring additional tests. All of the patient's questions were answered, patient is agreeable to proceed. Consent signed and  in chart.    Thank you for this interesting consult.  I greatly enjoyed meeting GABRIELLE MESTER and look forward to participating in their care.  A copy of this report was sent to the requesting provider on this date.  Electronically Signed: Greggory Keen, MD 03/08/2017, 8:37 AM   I spent a total of  15 Minutes   in face to face in clinical consultation, greater than 50% of which was counseling/coordinating care for this patient with CMML.

## 2017-03-15 ENCOUNTER — Telehealth: Payer: Self-pay | Admitting: *Deleted

## 2017-03-15 NOTE — Telephone Encounter (Signed)
Patient was seen 2 weeks ago.  Dr Woodfin Ganja is aware of weight loss as per office visit note.  Please see below recommendations for weight loss which was documented in the OV note and given on AVS.    5. Weight loss: CT scans as above. Thyroid panel within normal limits. Encouraged supplements such as Ensure and Boost. RX Megace 40 mg daily

## 2017-03-15 NOTE — Telephone Encounter (Signed)
Patient has started taking Megac, no appetite improvement yet, encouraged patient to continue to try and drink supplements.

## 2017-03-15 NOTE — Telephone Encounter (Signed)
Patient's wife called with concern over his recent weight loss and wondered if he should be seen earlier than 04/29/2017?

## 2017-03-16 NOTE — Telephone Encounter (Signed)
Agreed and see me next week to discuss BMbx results and wt check.

## 2017-03-18 ENCOUNTER — Encounter (HOSPITAL_COMMUNITY): Payer: Self-pay

## 2017-03-19 ENCOUNTER — Other Ambulatory Visit: Payer: Self-pay | Admitting: Family Medicine

## 2017-03-19 NOTE — Telephone Encounter (Signed)
Last office visit 07/29/2016 with Gentry Fitz for COPD.  Last refilled Tizanidine 03/25/2016 for #90 with 3 refills.  Gabapentin 07/22/2016 for #180 with 3 refills.  Ok to refill?

## 2017-03-22 LAB — CHROMOSOME ANALYSIS, BONE MARROW

## 2017-03-22 LAB — TISSUE HYBRIDIZATION (BONE MARROW)-NCBH

## 2017-03-22 NOTE — Progress Notes (Signed)
[place]  Telephone:(336) [phone] Fax:(336) [phone]  ID: Tony Watson OB: 12/04/1950  MR#: [id number]  FOY#:[id number]  Patient Care Team: Owens Loffler, MD as PCP - General  CHIEF COMPLAINT: Bone marrow biopsy proven CMML-1.  INTERVAL HISTORY: Patient returns to clinic today for follow-up and discussion of his bone marrow biopsy results. He continues to have a poor appetite and losing weight. He continues to have persistent weakness and fatigue. He denies any fevers or night sweats. He has no neurologic complaints. He has no chest pain or shortness of breath. He denies any nausea, vomiting, constipation, or diarrhea. He has no urinary complaints. Patient offers no further specific complaints today.  REVIEW OF SYSTEMS:   Review of Systems  Constitutional: Positive for malaise/fatigue and weight loss. Negative for diaphoresis and fever.  HENT: Negative.  Negative for sore throat.   Respiratory: Negative for cough and shortness of breath.   Cardiovascular: Negative.  Negative for chest pain and leg swelling.  Gastrointestinal: Negative.  Negative for abdominal pain, nausea and vomiting.  Genitourinary: Negative.   Musculoskeletal: Negative.   Neurological: Positive for weakness.  Endo/Heme/Allergies: Does not bruise/bleed easily.  Psychiatric/Behavioral: Negative.  The patient is not nervous/anxious.     As per HPI. Otherwise, a complete review of systems is negative.  PAST MEDICAL HISTORY: Past Medical History:  Diagnosis Date  . Arthritis   . Asthma   . Bronchitis   . CAD (coronary artery disease)    inferior MI 11/99 (in [place]) with PCI to [facility]. last [place] 2004 [facility]): EF 50%, patent RCA stent. no obstructive disease. last myoview 2008: EF 42%, inferior infarct, no ischemia.   . 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 COPD (chronic obstructive pulmonary disease) ([place])   . Cough    CHRONIC  . Diabetic autonomic neuropathy associated with type 2 diabetes  mellitus ([place]) 03/27/2015  . Diverticulosis 2002  . DM2 (diabetes mellitus, type 2) (HCC)    A1c 6.0% 12/11  . Dyspnea   . Edema   . GERD (gastroesophageal reflux disease)   . H/O wheezing   . HTN (hypertension)   . Hyperlipidemia   . Ischemic cardiomyopathy    mild with EF 42% on myoviews 2008.  . Medical non-compliance   . Myocardial infarction ([place])   . Nicotine dependence    chronic, active  . Pneumonia    in the past  . Seasonal allergic rhinitis   . Sleep apnea   . Smoker   . Spinal stenosis   . 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 Systolic heart failure secondary to coronary artery disease ([place]) 05/30/2012  . TIA (transient ischemic attack)    hx; now s/p PFO closure 2004 (in [place])  . Ulcer     PAST SURGICAL HISTORY: Past Surgical History:  Procedure Laterality Date  . BACK SURGERY     patient denies-just lumbar punctures  . CARDIAC CATHETERIZATION  11/99   CI per PMH  . CATARACT EXTRACTION W/PHACO Right 11/24/2016   Procedure: CATARACT EXTRACTION PHACO AND INTRAOCULAR LENS PLACEMENT (IOC);  Surgeon: Birder Robson, MD;  Location: [facility] ORS;  Service: Ophthalmology;  Laterality: Right;  [place] 1:04.3 AP% 22.3 CDE 14.35 Fluid pack lot # [id number]  . CATARACT EXTRACTION W/PHACO Left 12/29/2016   Procedure: CATARACT EXTRACTION PHACO AND INTRAOCULAR LENS PLACEMENT (IOC);  Surgeon: Birder Robson, MD;  Location: [facility] ORS;  Service: Ophthalmology;  Laterality: Left;  [place] 00:52 AP% 18.5 CDE 9.67 Fluid pack  lot # [id number]  . CORONARY ANGIOPLASTY     STENT  . CORONARY ARTERY BYPASS GRAFT    . EYE SURGERY    . open heart surgery  2004   PFO repair    FAMILY HISTORY Family History  Problem Relation Age of Onset  . Coronary artery disease Unknown        family hx  . Breast cancer Unknown        1st egree relative <50       ADVANCED DIRECTIVES:    HEALTH MAINTENANCE: Social History  Substance Use Topics  . Smoking status: Current Every Day Smoker      Packs/day: 1.00    Years: 45.00    Types: Cigarettes  . Smokeless tobacco: Never Used     Comment: 1 ppd +40 years  . Alcohol use 0.0 oz/week     Comment: beer weekly     Colonoscopy:  PAP:  Bone density:  Lipid panel:  No Known Allergies  Current Outpatient Prescriptions  Medication Sig Dispense Refill  . acetaminophen (TYLENOL) 500 MG tablet Take 1,000 mg by mouth daily as needed for moderate pain or headache.    . carvedilol (COREG) 3.125 MG tablet TAKE 1 TABLET (3.125 MG TOTAL) BY MOUTH 2 (TWO) TIMES DAILY. 180 tablet 0  . Cholecalciferol (VITAMIN D) 2000 units CAPS Take 2,000 Units by mouth every evening.    . gabapentin (NEURONTIN) 300 MG capsule Take 1 capsule (300 mg total) by mouth 2 (two) times daily. (Patient taking differently: Take 300 mg by mouth at bedtime. ) 180 capsule 3  . gabapentin (NEURONTIN) 300 MG capsule TAKE 1 CAPSULE (300 MG TOTAL) BY MOUTH AT BEDTIME. 90 capsule 3  . Glucosamine HCl 1000 MG TABS Take 1,000 mg by mouth every evening.    Marland Kitchen ipratropium-albuterol (DUONEB) 0.5-2.5 (3) MG/3ML SOLN TAKE 3 MLS BY NEBULIZATION EVERY 4 (FOUR) HOURS AS NEEDED. (Patient taking differently: Take 3 mLs by nebulization every 4 (four) hours as needed (shortness of breath). ) 540 mL 2  . Magnesium 250 MG TABS Take 250 mg by mouth every evening.    . megestrol (MEGACE) 40 MG tablet Take 1 tablet (40 mg total) by mouth daily. 30 tablet 0  . metFORMIN (GLUCOPHAGE-XR) 500 MG 24 hr tablet TAKE 1 TABLET (500 MG TOTAL) BY MOUTH DAILY WITH BREAKFAST. 90 tablet 0  . omeprazole (PRILOSEC) 20 MG capsule Take 20 mg by mouth daily.    Marland Kitchen oxymetazoline (AFRIN) 0.05 % nasal spray Place 1 spray into both nostrils daily as needed for congestion.    . pseudoephedrine (SUDAFED) 30 MG tablet Take 30 mg by mouth daily as needed for congestion.     . SYMBICORT 160-4.5 MCG/ACT inhaler INHALE 2 PUFFS INTO THE LUNGS 2 (TWO) TIMES DAILY. 10.2 Inhaler 4  . tiZANidine (ZANAFLEX) 4 MG tablet TAKE 1  TABLET (4 MG TOTAL) BY MOUTH NIGHTLY. 90 tablet 3   No current facility-administered medications for this visit.     OBJECTIVE: Vitals:   03/23/17 1451  BP: 131/75  Pulse: (!) 108  Resp: 18  Temp: 98.3 F (36.8 C)     Body mass index is 21.41 kg/m.    ECOG FS:0 - Asymptomatic  General: Well-developed, well-nourished, no acute distress. Eyes: Pink conjunctiva, anicteric sclera. HEENT: Scant lymphadenopathy on left neck, erythema noted in oropharynx. Lungs: Clear to auscultation bilaterally. Heart: Regular rate and rhythm. No rubs, murmurs, or gallops. Abdomen: Soft, nontender, nondistended. No organomegaly noted, normoactive bowel sounds.  Musculoskeletal: No edema, cyanosis, or clubbing. Neuro: Alert, answering all questions appropriately. Cranial nerves grossly intact. Skin: No rashes or petechiae noted. Psych: Normal affect.   LAB RESULTS:  Lab Results  Component Value Date   NA 136 27-Mar-2017   K 4.1 03-27-2017   CL 101 Mar 27, 2017   CO2 24 03-27-17   GLUCOSE 118 (H) 03-27-2017   BUN 10 March 27, 2017   CREATININE 0.83 03-27-17   CALCIUM 9.4 Mar 27, 2017   PROT 9.0 (H) 02/04/2017   ALBUMIN 3.6 02/04/2017   AST 33 02/04/2017   ALT 30 02/04/2017   ALKPHOS 72 02/04/2017   BILITOT 0.3 02/04/2017   GFRNONAA >60 2017-03-27   GFRAA >60 Mar 27, 2017    Lab Results  Component Value Date   WBC 6.6 Mar 27, 2017   NEUTROABS 2.5 Mar 27, 2017   HGB 10.7 (L) 2017/03/27   HCT 31.3 (L) 03-27-2017   MCV 93.8 Mar 27, 2017   PLT 204 27-Mar-2017     STUDIES: Ct Biopsy  Result Date: 03-27-2017 INDICATION: Chronic myelomalacic leukemia EXAM: CT GUIDED RIGHT ILIAC BONE MARROW ASPIRATION AND CORE BIOPSY Date:  Aug 18, 2018August 18, 2018 10:04 am Radiologist:  M. Ruel Favors, MD Guidance:  CT FLUOROSCOPY TIME:  Fluoroscopy Time: NONE. MEDICATIONS: None. ANESTHESIA/SEDATION: 2.0 mg IV Versed; 50 mcg IV Fentanyl Moderate Sedation Time:  14 minutes The patient was continuously monitored during the  procedure by the interventional radiology nurse under my direct supervision. CONTRAST:  None. COMPLICATIONS: None PROCEDURE: Informed consent was obtained from the patient following explanation of the procedure, risks, benefits and alternatives. The patient understands, agrees and consents for the procedure. All questions were addressed. A time out was performed. The patient was positioned prone and non-contrast localization CT was performed of the pelvis to demonstrate the iliac marrow spaces. Maximal barrier sterile technique utilized including caps, mask, sterile gowns, sterile gloves, large sterile drape, hand hygiene, and Betadine prep. Under sterile conditions and local anesthesia, an 11 gauge coaxial bone biopsy needle was advanced into the right iliac marrow space. Needle position was confirmed with CT imaging. Initially, bone marrow aspiration was performed. Next, the 11 gauge outer cannula was utilized to obtain a right iliac bone marrow core biopsy. Needle was removed. Hemostasis was obtained with compression. The patient tolerated the procedure well. Samples were prepared with the cytotechnologist. No immediate complications. IMPRESSION: CT guided right iliac bone marrow aspiration and core biopsy. Electronically Signed   By: Judie Petit.  Shick M.D.   On: 2017/03/27 10:11    ASSESSMENT: Bone marrow biopsy proven CMML-1.  PLAN:    1. CMML: Bone marrow biopsy completed on August 30, 2015 confirmed the diagnosis. Repeat bone marrow biopsy on 03-27-17 is essentially unchanged. Patient's absolute monocytosis has been greater than 1.0 and persistent since at least July 2014. The remainder of his blood work including BCR-ABL and ANA are negative or within normal limits. Previously, hepatitis C and HIV were also negative.  He will likely need treatment with azacitidine or decitabine at some point, but not at this moment. He has no evidence of end organ involvement. CT scan reveals stable lung nodules with a  possible atypical infection. Return to clinic in 3 weeks for further evaluation. 2. Thrombocytopenia: Resolved. 3. Neutropenia: Resolved. 4. Pulmonary nodule: Slightly larger but stable from recent CT scan compared to the scan in 04/2016..  5. Weight loss: CT scans and bone marrow biopsy as above. Thyroid panel within normal limits. Encouraged supplements such as Ensure and Boost. RX Megace 40 mg daily. Have also referred patient to  infectious disease to determine this possible atypical infection seen on CT scan could be etiology of his weight loss.  Approximately 30 minutes was spent in discussion of which greater than 50% was consultation.  Patient expressed understanding and was in agreement with this plan. He also understands that He can call clinic at any time with any questions, concerns, or complaints.    Lloyd Huger, MD   03/26/2017 9:34 AM

## 2017-03-23 ENCOUNTER — Inpatient Hospital Stay: Payer: Medicare HMO | Attending: Oncology | Admitting: Oncology

## 2017-03-23 VITALS — BP 131/75 | HR 108 | Temp 98.3°F | Resp 18 | Wt 153.5 lb

## 2017-03-23 DIAGNOSIS — R911 Solitary pulmonary nodule: Secondary | ICD-10-CM | POA: Insufficient documentation

## 2017-03-23 DIAGNOSIS — R634 Abnormal weight loss: Secondary | ICD-10-CM | POA: Insufficient documentation

## 2017-03-23 DIAGNOSIS — M1990 Unspecified osteoarthritis, unspecified site: Secondary | ICD-10-CM | POA: Insufficient documentation

## 2017-03-23 DIAGNOSIS — I252 Old myocardial infarction: Secondary | ICD-10-CM | POA: Insufficient documentation

## 2017-03-23 DIAGNOSIS — J449 Chronic obstructive pulmonary disease, unspecified: Secondary | ICD-10-CM | POA: Diagnosis not present

## 2017-03-23 DIAGNOSIS — I2510 Atherosclerotic heart disease of native coronary artery without angina pectoris: Secondary | ICD-10-CM | POA: Diagnosis not present

## 2017-03-23 DIAGNOSIS — R630 Anorexia: Secondary | ICD-10-CM | POA: Insufficient documentation

## 2017-03-23 DIAGNOSIS — R930 Abnormal findings on diagnostic imaging of skull and head, not elsewhere classified: Secondary | ICD-10-CM | POA: Diagnosis not present

## 2017-03-23 DIAGNOSIS — G4730 Sleep apnea, unspecified: Secondary | ICD-10-CM | POA: Diagnosis not present

## 2017-03-23 DIAGNOSIS — Z79899 Other long term (current) drug therapy: Secondary | ICD-10-CM | POA: Diagnosis not present

## 2017-03-23 DIAGNOSIS — C9310 Chronic myelomonocytic leukemia not having achieved remission: Secondary | ICD-10-CM | POA: Insufficient documentation

## 2017-03-23 DIAGNOSIS — E119 Type 2 diabetes mellitus without complications: Secondary | ICD-10-CM | POA: Diagnosis not present

## 2017-03-23 DIAGNOSIS — K219 Gastro-esophageal reflux disease without esophagitis: Secondary | ICD-10-CM | POA: Insufficient documentation

## 2017-03-23 DIAGNOSIS — I10 Essential (primary) hypertension: Secondary | ICD-10-CM | POA: Insufficient documentation

## 2017-03-23 DIAGNOSIS — E785 Hyperlipidemia, unspecified: Secondary | ICD-10-CM | POA: Insufficient documentation

## 2017-03-25 ENCOUNTER — Other Ambulatory Visit: Payer: Self-pay | Admitting: *Deleted

## 2017-03-25 DIAGNOSIS — C9310 Chronic myelomonocytic leukemia not having achieved remission: Secondary | ICD-10-CM

## 2017-03-28 ENCOUNTER — Other Ambulatory Visit: Payer: Self-pay | Admitting: Family Medicine

## 2017-04-01 ENCOUNTER — Other Ambulatory Visit: Payer: Self-pay | Admitting: Oncology

## 2017-04-09 NOTE — Progress Notes (Signed)
[name]  Telephone:(336) [phone] Fax:(336) [phone]  ID: Tony Watson OB: [birth year]  MR#: [id number]  WIO#:[id number]  Patient Care Team: Owens Loffler, MD as PCP - General Ola Spurr Cheral Marker, MD (Infectious Diseases) [name], MD as Consulting Physician (Pulmonary Disease)  CHIEF COMPLAINT: Bone marrow biopsy proven CMML-1.  INTERVAL HISTORY: Patient returns to clinic today for continued follow-up of his weight loss. His appetite has improved, but he has lost several pounds in the interim. He continues to have persistent weakness and fatigue. He denies any fevers or night sweats. He has no neurologic complaints. He has no chest pain or shortness of breath. He denies any nausea, vomiting, constipation, or diarrhea. He has no urinary complaints. Patient offers no further specific complaints today.  REVIEW OF SYSTEMS:   Review of Systems  Constitutional: Positive for malaise/fatigue and weight loss. Negative for diaphoresis and fever.  HENT: Negative.  Negative for sore throat.   Respiratory: Negative for cough and shortness of breath.   Cardiovascular: Negative.  Negative for chest pain and leg swelling.  Gastrointestinal: Negative.  Negative for abdominal pain, nausea and vomiting.  Genitourinary: Negative.   Musculoskeletal: Negative.   Neurological: Positive for weakness.  Endo/Heme/Allergies: Does not bruise/bleed easily.  Psychiatric/Behavioral: Negative.  The patient is not nervous/anxious.     As per HPI. Otherwise, a complete review of systems is negative.  PAST MEDICAL HISTORY: Past Medical History:  Diagnosis Date  . Arthritis   . Asthma   . Bronchitis   . CAD (coronary artery disease)    inferior MI 11/99 (in [place]) with PCI to [facility]. last [place] 2004 [facility]): EF 50%, patent RCA stent. no obstructive disease. last myoview 2008: EF 42%, inferior infarct, no ischemia.   . 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 COPD (chronic obstructive pulmonary  disease) ([place])   . Cough    CHRONIC  . Diabetic autonomic neuropathy associated with type 2 diabetes mellitus ([place]) 03/27/2015  . Diverticulosis 2002  . DM2 (diabetes mellitus, type 2) (HCC)    A1c 6.0% 12/11  . Dyspnea   . Edema   . GERD (gastroesophageal reflux disease)   . H/O wheezing   . HTN (hypertension)   . Hyperlipidemia   . Ischemic cardiomyopathy    mild with EF 42% on myoviews 2008.  . Medical non-compliance   . Myocardial infarction ([place])   . Nicotine dependence    chronic, active  . Pneumonia    in the past  . Seasonal allergic rhinitis   . Sleep apnea   . Smoker   . Spinal stenosis   . Stroke ([place])    RECURRENT AFFECTING MEMORY  . Systolic heart failure secondary to coronary artery disease ([place]) 05/30/2012  . TIA (transient ischemic attack)    hx; now s/p PFO closure 2004 (in [place])  . Ulcer     PAST SURGICAL HISTORY: Past Surgical History:  Procedure Laterality Date  . BACK SURGERY     patient denies-just lumbar punctures  . CARDIAC CATHETERIZATION  11/99   CI per PMH  . CATARACT EXTRACTION W/PHACO Right 11/24/2016   Procedure: CATARACT EXTRACTION PHACO AND INTRAOCULAR LENS PLACEMENT (IOC);  Surgeon: Birder Robson, MD;  Location: [facility] ORS;  Service: Ophthalmology;  Laterality: Right;  [place] 1:04.3 AP% 22.3 CDE 14.35 Fluid pack lot # [id number]  . CATARACT EXTRACTION W/PHACO Left 12/29/2016   Procedure: CATARACT EXTRACTION PHACO AND INTRAOCULAR LENS PLACEMENT (IOC);  Surgeon: Birder Robson, MD;  Location: [facility] ORS;  Service: Ophthalmology;  Laterality: Left;  [place] 00:52 AP% 18.5 CDE 9.67 Fluid pack lot # [id number]  . CORONARY ANGIOPLASTY     STENT  . CORONARY ARTERY BYPASS GRAFT    . EYE SURGERY    . open heart surgery  2004   PFO repair    FAMILY HISTORY Family History  Problem Relation Age of Onset  . Coronary artery disease Unknown        family hx  . Breast cancer Unknown        1st egree relative <50       ADVANCED DIRECTIVES:     HEALTH MAINTENANCE: Social History  Substance Use Topics  . Smoking status: Current Every Day Smoker    Packs/day: 1.00    Years: 45.00    Types: Cigarettes  . Smokeless tobacco: Never Used     Comment: 1 ppd +40 years  . Alcohol use 0.0 oz/week     Comment: beer weekly     Colonoscopy:  PAP:  Bone density:  Lipid panel:  No Known Allergies  Current Outpatient Prescriptions  Medication Sig Dispense Refill  . acetaminophen (TYLENOL) 500 MG tablet Take 1,000 mg by mouth daily as needed for moderate pain or headache.    . carvedilol (COREG) 3.125 MG tablet TAKE 1 TABLET (3.125 MG TOTAL) BY MOUTH 2 (TWO) TIMES DAILY. 180 tablet 0  . Cholecalciferol (VITAMIN D) 2000 units CAPS Take 2,000 Units by mouth every evening.    . gabapentin (NEURONTIN) 300 MG capsule Take 1 capsule (300 mg total) by mouth 2 (two) times daily. (Patient taking differently: Take 300 mg by mouth at bedtime. ) 180 capsule 3  . gabapentin (NEURONTIN) 300 MG capsule TAKE 1 CAPSULE (300 MG TOTAL) BY MOUTH AT BEDTIME. 90 capsule 3  . Glucosamine HCl 1000 MG TABS Take 1,000 mg by mouth every evening.    Marland Kitchen ipratropium-albuterol (DUONEB) 0.5-2.5 (3) MG/3ML SOLN TAKE 3 MLS BY NEBULIZATION EVERY 4 (FOUR) HOURS AS NEEDED. 630 mL 5  . Magnesium 250 MG TABS Take 250 mg by mouth every evening.    . megestrol (MEGACE) 40 MG tablet TAKE 1 TABLET BY MOUTH EVERY DAY 30 tablet 0  . metFORMIN (GLUCOPHAGE-XR) 500 MG 24 hr tablet TAKE 1 TABLET (500 MG TOTAL) BY MOUTH DAILY WITH BREAKFAST. 90 tablet 0  . omeprazole (PRILOSEC) 20 MG capsule Take 20 mg by mouth daily.    Marland Kitchen oxymetazoline (AFRIN) 0.05 % nasal spray Place 1 spray into both nostrils daily as needed for congestion.    . pseudoephedrine (SUDAFED) 30 MG tablet Take 30 mg by mouth daily as needed for congestion.     . SYMBICORT 160-4.5 MCG/ACT inhaler INHALE 2 PUFFS INTO THE LUNGS 2 (TWO) TIMES DAILY. 10.2 Inhaler 4  . tiZANidine (ZANAFLEX) 4 MG tablet TAKE 1 TABLET (4 MG  TOTAL) BY MOUTH NIGHTLY. 90 tablet 3   No current facility-administered medications for this visit.     OBJECTIVE: Vitals:   04/13/17 1522  BP: 140/77  Pulse: (!) 111  Resp: 18  Temp: 98 F (36.7 C)     Body mass index is 20.31 kg/m.    ECOG FS:0 - Asymptomatic  General: Well-developed, well-nourished, no acute distress. Eyes: Pink conjunctiva, anicteric sclera. HEENT: Scant lymphadenopathy on left neck, erythema noted in oropharynx. Lungs: Clear to auscultation bilaterally. Heart: Regular rate and rhythm. No rubs, murmurs, or gallops. Abdomen: Soft, nontender, nondistended. No organomegaly noted, normoactive bowel sounds. Musculoskeletal: No edema, cyanosis, or clubbing. Neuro:  Alert, answering all questions appropriately. Cranial nerves grossly intact. Skin: No rashes or petechiae noted. Psych: Normal affect.   LAB RESULTS:  Lab Results  Component Value Date   NA 136 03/08/2017   K 4.1 03/08/2017   CL 101 03/08/2017   CO2 24 03/08/2017   GLUCOSE 118 (H) 03/08/2017   BUN 10 03/08/2017   CREATININE 0.83 03/08/2017   CALCIUM 9.4 03/08/2017   PROT 9.0 (H) 02/04/2017   ALBUMIN 3.6 02/04/2017   AST 33 02/04/2017   ALT 30 02/04/2017   ALKPHOS 72 02/04/2017   BILITOT 0.3 02/04/2017   GFRNONAA >60 03/08/2017   GFRAA >60 03/08/2017    Lab Results  Component Value Date   WBC 7.2 04/13/2017   NEUTROABS 2.6 04/13/2017   HGB 9.3 (L) 04/13/2017   HCT 26.9 (L) 04/13/2017   MCV 93.0 04/13/2017   PLT 263 04/13/2017     STUDIES: No results found.  ASSESSMENT: Bone marrow biopsy proven CMML-1.  PLAN:    1. CMML: Bone marrow biopsy completed on August 30, 2015 confirmed the diagnosis. Repeat bone marrow biopsy on March 08, 2017 is essentially unchanged. Patient's absolute monocytosis has been greater than 1.0 and persistent since at least July 2014. The remainder of his blood work including BCR-ABL and ANA are negative or within normal limits. Previously, hepatitis C  and HIV were also negative.  He will likely need treatment with azacitidine or decitabine at some point, but not at this time. He has no evidence of end organ involvement. CT scansFrom February 24, 2017 reveals stable lung nodules with a possible atypical infection. Return to clinic in 3 weeks for further evaluation. 2. Thrombocytopenia: Resolved. 3. Neutropenia: Resolved. 4. Pulmonary nodule: Slightly larger but stable from recent CT scan compared to the scan in 04/2016. 5. Weight loss: CT scans and bone marrow biopsy as above. Thyroid panel within normal limits. Encouraged supplements such as Ensure and Boost. Continue Megace 40 mg daily. Have also referred patient to infectious disease and pulmonology to determine this possible atypical infection seen on CT scan could be etiology of his weight loss.  Approximately 30 minutes was spent in discussion of which greater than 50% was consultation.  Patient expressed understanding and was in agreement with this plan. He also understands that He can call clinic at any time with any questions, concerns, or complaints.    Lloyd Huger, MD   04/14/2017 8:43 AM

## 2017-04-13 ENCOUNTER — Telehealth: Payer: Self-pay | Admitting: *Deleted

## 2017-04-13 ENCOUNTER — Inpatient Hospital Stay: Payer: Medicare HMO | Attending: Oncology | Admitting: Oncology

## 2017-04-13 ENCOUNTER — Inpatient Hospital Stay: Payer: Medicare HMO

## 2017-04-13 VITALS — BP 140/77 | HR 111 | Temp 98.0°F | Resp 18 | Wt 145.6 lb

## 2017-04-13 DIAGNOSIS — Z8673 Personal history of transient ischemic attack (TIA), and cerebral infarction without residual deficits: Secondary | ICD-10-CM | POA: Insufficient documentation

## 2017-04-13 DIAGNOSIS — R634 Abnormal weight loss: Secondary | ICD-10-CM | POA: Insufficient documentation

## 2017-04-13 DIAGNOSIS — Z79899 Other long term (current) drug therapy: Secondary | ICD-10-CM | POA: Diagnosis not present

## 2017-04-13 DIAGNOSIS — I10 Essential (primary) hypertension: Secondary | ICD-10-CM | POA: Diagnosis not present

## 2017-04-13 DIAGNOSIS — E785 Hyperlipidemia, unspecified: Secondary | ICD-10-CM | POA: Insufficient documentation

## 2017-04-13 DIAGNOSIS — F17210 Nicotine dependence, cigarettes, uncomplicated: Secondary | ICD-10-CM | POA: Diagnosis not present

## 2017-04-13 DIAGNOSIS — C9310 Chronic myelomonocytic leukemia not having achieved remission: Secondary | ICD-10-CM | POA: Insufficient documentation

## 2017-04-13 DIAGNOSIS — E119 Type 2 diabetes mellitus without complications: Secondary | ICD-10-CM | POA: Insufficient documentation

## 2017-04-13 DIAGNOSIS — I2510 Atherosclerotic heart disease of native coronary artery without angina pectoris: Secondary | ICD-10-CM | POA: Diagnosis not present

## 2017-04-13 DIAGNOSIS — I252 Old myocardial infarction: Secondary | ICD-10-CM | POA: Diagnosis not present

## 2017-04-13 DIAGNOSIS — R911 Solitary pulmonary nodule: Secondary | ICD-10-CM | POA: Diagnosis not present

## 2017-04-13 DIAGNOSIS — K219 Gastro-esophageal reflux disease without esophagitis: Secondary | ICD-10-CM | POA: Insufficient documentation

## 2017-04-13 DIAGNOSIS — M1990 Unspecified osteoarthritis, unspecified site: Secondary | ICD-10-CM | POA: Insufficient documentation

## 2017-04-13 DIAGNOSIS — J45909 Unspecified asthma, uncomplicated: Secondary | ICD-10-CM | POA: Insufficient documentation

## 2017-04-13 DIAGNOSIS — J449 Chronic obstructive pulmonary disease, unspecified: Secondary | ICD-10-CM | POA: Diagnosis not present

## 2017-04-13 DIAGNOSIS — M4800 Spinal stenosis, site unspecified: Secondary | ICD-10-CM | POA: Diagnosis not present

## 2017-04-13 LAB — CBC WITH DIFFERENTIAL/PLATELET
BASOS PCT: 1 %
Basophils Absolute: 0.1 10*3/uL (ref 0–0.1)
EOS PCT: 0 %
Eosinophils Absolute: 0 10*3/uL (ref 0–0.7)
HEMATOCRIT: 26.9 % — AB (ref 40.0–52.0)
Hemoglobin: 9.3 g/dL — ABNORMAL LOW (ref 13.0–18.0)
LYMPHS ABS: 1.9 10*3/uL (ref 1.0–3.6)
Lymphocytes Relative: 26 %
MCH: 32.3 pg (ref 26.0–34.0)
MCHC: 34.7 g/dL (ref 32.0–36.0)
MCV: 93 fL (ref 80.0–100.0)
MONOS PCT: 36 %
Monocytes Absolute: 2.6 10*3/uL — ABNORMAL HIGH (ref 0.2–1.0)
NEUTROS ABS: 2.6 10*3/uL (ref 1.4–6.5)
Neutrophils Relative %: 37 %
Platelets: 263 10*3/uL (ref 150–440)
RBC: 2.9 MIL/uL — ABNORMAL LOW (ref 4.40–5.90)
RDW: 16.3 % — AB (ref 11.5–14.5)
WBC: 7.2 10*3/uL (ref 3.8–10.6)

## 2017-04-13 NOTE — Telephone Encounter (Signed)
New patient appt scheduled with Dr. Mortimer Fries 9/13.

## 2017-04-13 NOTE — Progress Notes (Signed)
Patient is here for follow up, wife is very concerned about her husband, she states he is dying and is in the anger phase.

## 2017-04-22 ENCOUNTER — Other Ambulatory Visit: Payer: Self-pay | Admitting: *Deleted

## 2017-04-22 ENCOUNTER — Encounter: Payer: Self-pay | Admitting: Internal Medicine

## 2017-04-22 ENCOUNTER — Ambulatory Visit (INDEPENDENT_AMBULATORY_CARE_PROVIDER_SITE_OTHER): Payer: Medicare HMO | Admitting: Internal Medicine

## 2017-04-22 VITALS — BP 110/60 | HR 116 | Resp 16 | Ht 71.0 in | Wt 143.0 lb

## 2017-04-22 DIAGNOSIS — R634 Abnormal weight loss: Secondary | ICD-10-CM | POA: Diagnosis not present

## 2017-04-22 DIAGNOSIS — J849 Interstitial pulmonary disease, unspecified: Secondary | ICD-10-CM | POA: Diagnosis not present

## 2017-04-22 DIAGNOSIS — R05 Cough: Secondary | ICD-10-CM | POA: Diagnosis not present

## 2017-04-22 DIAGNOSIS — R1310 Dysphagia, unspecified: Secondary | ICD-10-CM

## 2017-04-22 DIAGNOSIS — R059 Cough, unspecified: Secondary | ICD-10-CM

## 2017-04-22 MED ORDER — TIOTROPIUM BROMIDE MONOHYDRATE 1.25 MCG/ACT IN AERS: 2.0000 | INHALATION_SPRAY | Freq: Two times a day (BID) | RESPIRATORY_TRACT | 5 refills | Status: DC

## 2017-04-22 MED ORDER — TIOTROPIUM BROMIDE MONOHYDRATE 1.25 MCG/ACT IN AERS: 2.0000 | INHALATION_SPRAY | Freq: Every day | RESPIRATORY_TRACT | 5 refills | Status: DC

## 2017-04-22 NOTE — Patient Instructions (Signed)
Patient will need repeat CT of his chest Start Spiriva therapy for presumed COPD Albuterol nebs as needed Check pulmonary function testing and 6 minute walk test Check overnight pulse oximetry Patient advised to stop smoking

## 2017-04-22 NOTE — Progress Notes (Signed)
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  He has had ongoing weight loss and issues with fatigue and DOE..  In March started to feel full and having no appetite as well as fatigue, no energy. Weight loss started around that time. Down 50 pounds since last year. Has chronic cough but no change. Did have some worsening DOE. 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  He has a very extensive smoking history smokes about 2-3 packs a day for approximately 50-60 years   IN Nov was having frequent Sore throat T episodes followed by episodes of hoarseness.  These episodes have occurred over and over again. Occas gest some burning feeling on tongue.  Was seen by ENT who did scope and said uvula had spot and he had bxp of it which he says was benign then had aspiration and bxp of L neck LN all inconclusive.  Had recent BM BXP as well. He has no fevers, very occas chills at night but night sweats. Feels cold all the time.  Treatments in past- had prednisone course in Nov and felt good after it. Has not had repeated course. Antibiotics - none since nov. Patient being taken care of by Dr. Grayland Ormond in oncology   Denies headaches, neck pain, Skin lesions, vision changes (except cataract surgery in June). No obvious LAN at this point. No chest pain. No abd pain, nausea or diarrhea. No dysuria. No joint pain or swelling.   Has had repeated PNAs as well and  intermittent abnormalities on CT chest.   Does report some dysphagia, feels like liquid get stuck in upper esophagus, hurts but then passes. Has to eat small bites.  Pets- 4 dogs. Marina Gravel - has a cockatiel. No cats. Lives in city with his wife. No other animal exposures  Travel - in [place] last year. Originally from [place] then [facility] then to [place] 14 years.  Hobbies- prior camping but none in 3 years. No other hobbies.  Was for a brief time 3 years in [employer], never abroad.   At this time patient has chronic shortness of breath and dyspnea on exertion with intermittent wheezing he states that his nebulizer therapy does help however he has used Symbicort in the past which does not help patient continues to smoke 1 pack per day Patient was prescribed prednisone therapy approximately 9 months ago and he states he felt a lot better when he received prednisone therapy  At this time is no signs of infection Does no signs of acute heart failure at this time   PAST MEDICAL HISTORY :   has a past medical history of Arthritis; Asthma; Bronchitis; CAD (coronary artery disease); Cancer [facility]); COPD (chronic obstructive pulmonary disease) ([place]); Cough; Diabetic autonomic neuropathy associated with type 2 diabetes mellitus ([place]) (03/27/2015); Diverticulosis (2002); DM2 (diabetes mellitus, type 2) ([place]); Dyspnea; Edema; GERD (gastroesophageal reflux disease); H/O wheezing; HTN (hypertension); Hyperlipidemia; Ischemic cardiomyopathy; Medical non-compliance; Myocardial infarction [facility]); Nicotine  dependence; Pneumonia; Seasonal allergic rhinitis; Sleep apnea; Smoker; Spinal stenosis; Stroke [facility]); 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  has a past surgical history that includes open heart surgery (2004); Cardiac catheterization (11/99); Coronary artery bypass graft; Coronary angioplasty; Eye surgery; Cataract  extraction w/PHACO (Right, 11/24/2016); Cataract extraction w/PHACO (Left, 12/29/2016); and Back surgery. Prior to Admission medications   Medication Sig Start Date End Date Taking? Authorizing Provider  acetaminophen (TYLENOL) 500 MG tablet Take 1,000 mg by mouth daily as needed for moderate pain or headache.   Yes [provider]  carvedilol (COREG) 3.125 MG tablet TAKE 1 TABLET (3.125 MG TOTAL) BY MOUTH 2 (TWO) TIMES DAILY. 03/19/17  Yes Copland, Frederico Hamman, MD  Cholecalciferol (VITAMIN D) 2000 units CAPS Take 2,000 Units by mouth every evening.   Yes [provider]  gabapentin (NEURONTIN) 300 MG capsule Take 1 capsule (300 mg total) by mouth 2 (two) times daily. Patient taking differently: Take 300 mg by mouth at bedtime.  07/22/16  Yes Copland, Frederico Hamman, MD  gabapentin (NEURONTIN) 300 MG capsule TAKE 1 CAPSULE (300 MG TOTAL) BY MOUTH AT BEDTIME. 03/21/17  Yes Copland, Spencer, MD  Glucosamine HCl 1000 MG TABS Take 1,000 mg by mouth every evening.   Yes [provider]  ipratropium-albuterol (DUONEB) 0.5-2.5 (3) MG/3ML SOLN TAKE 3 MLS BY NEBULIZATION EVERY 4 (FOUR) HOURS AS NEEDED. 03/29/17  Yes Copland, Spencer, MD  Magnesium 250 MG TABS Take 250 mg by mouth every evening.   Yes [provider]  megestrol (MEGACE) 40 MG tablet TAKE 1 TABLET BY MOUTH EVERY DAY 04/01/17  Yes Lloyd Huger, MD  metFORMIN (GLUCOPHAGE-XR) 500 MG 24 hr tablet TAKE 1 TABLET (500 MG TOTAL) BY MOUTH DAILY WITH BREAKFAST. 03/19/17  Yes Copland, Frederico Hamman, MD  omeprazole (PRILOSEC) 20 MG capsule Take 20 mg by mouth daily.   Yes [provider]  oxymetazoline (AFRIN) 0.05 % nasal spray Place 1 spray into both nostrils daily as needed for congestion.   Yes [provider]  pseudoephedrine (SUDAFED) 30 MG tablet Take 30 mg by mouth daily as needed for congestion.    Yes [provider]  SYMBICORT 160-4.5 MCG/ACT inhaler INHALE 2 PUFFS INTO THE LUNGS 2 (TWO) TIMES  DAILY. 09/21/16  Yes Copland, Frederico Hamman, MD  tiZANidine (ZANAFLEX) 4 MG tablet TAKE 1 TABLET (4 MG TOTAL) BY MOUTH NIGHTLY. 03/21/17  Yes Copland, Frederico Hamman, MD   No Known Allergies  FAMILY HISTORY:  family history includes Breast cancer in his unknown relative; Coronary artery disease in his unknown relative. SOCIAL HISTORY:  reports that he has been smoking Cigarettes.  He has a 45.00 pack-year smoking history. He has never used smokeless tobacco. He reports that he drinks alcohol. He reports that he does not use drugs.  REVIEW OF SYSTEMS:   Constitutional: Negative for fever, chills, + weight loss, + malaise/fatigue and + diaphoresis.  HENT: Negative for hearing loss, ear pain, nosebleeds, congestion, sore throat, neck pain, tinnitus and ear discharge.   Eyes: Negative for blurred vision, double vision, photophobia, pain, discharge and redness.  Respiratory: Negative for cough, hemoptysis, sputum production, +shortness of breath,+ wheezing and stridor.   Cardiovascular: Negative for chest pain, palpitations, orthopnea, claudication, leg swelling and PND.  Gastrointestinal: Negative for heartburn, nausea, vomiting, abdominal pain, diarrhea, constipation, blood in stool and melena.  Genitourinary: Negative for dysuria, urgency, frequency, hematuria and flank pain.  Musculoskeletal: Negative for myalgias, back pain, joint pain and falls.  Skin: Negative for itching and rash.  Neurological: Negative for dizziness, tingling, tremors, sensory change, speech change, focal weakness, seizures, loss of consciousness, weakness and headaches.  Endo/Heme/Allergies: Negative for environmental allergies and polydipsia. Does not bruise/bleed easily.  ALL OTHER ROS ARE NEGATIVE    VITAL SIGNS: BP 110/60 (BP Location: Left Arm, Cuff Size: Normal)   Pulse (!) 116   Resp 16   Ht _0  (1.803 m)   Wt 143 lb (64.9 kg)   SpO2 97%   BMI 19.94 kg/m    Physical Examination:   GENERAL:NAD, no fevers,  chills, +thin +cachexia HEAD: Normocephalic, atraumatic.  EYES: Pupils equal, round, reactive to light. Extraocular muscles intact. No scleral icterus.  MOUTH: Moist mucosal membrane.   EAR, NOSE, THROAT: Clear without exudates. No external lesions.  NECK: Supple. No thyromegaly. No nodules. No JVD.  PULMONARY:CTA B/L no wheezes, no crackles, no rhonchi CARDIOVASCULAR: S1 and S2. Regular rate and rhythm. No murmurs, rubs, or gallops. No edema.  GASTROINTESTINAL: Soft, nontender, nondistended. No masses. Positive bowel sounds.  MUSCULOSKELETAL: No swelling, clubbing, or edema. Range of motion full in all extremities.  NEUROLOGIC: Cranial nerves II through XII are intact. No gross focal neurological deficits.  SKIN: No ulceration, lesions, rashes, or cyanosis. Skin warm and dry. Turgor intact.  PSYCHIATRIC: Mood, affect within normal limits. The patient is awake, alert and oriented x 3. Insight, judgment intact.      ASSESSMENT / PLAN: 66 y.o. male here for new patient infectious diseases consultation for weight loss of up to 50 pounds and abnormal CT in a patient with a 3-4 year history of indolent CMML not on treatment as well as COPD, prior heavy and current tobacco use, CAD. Interestingly he has had PNA before and had an abnormal CT scan of chest in 2016 with right upper lobe infiltrate and and its atypical appearance at that time. He is also had recurrent episodes of pharyngitis and hoarseness, he seems to have some issues with dysphasia.  He did have recently repeat bone marrow biopsy which just confirmed the findings on previous showing CMML. Patient was also evaluated by infectious disease and supposedly all lab tests and workup have been nondiagnostic at this time which includes fungal serologies TB testing and HIV testing   He does have some significant exposure to mold when he was working in [employer]. He also has a pet bird. His only other animal exposures dogs.    Differential diagnosis for this constellation of symptoms and CT findings is brought but includes mycobacterial diseases such as tuberculosis or Mycobacterium avium, fungal infections such as histoplasmosis, cryptococcus, Aspergillus, or atypical pathogen such as chlamydia psittacosis. Given his history of dysphasia and weight loss I also wonder if he may be having some repeated aspirations. His dysphagia is very concerning to me and high probability that he may have underlying esophageal cancer or may even have a gastrointestinal cancer which needs to be ruled out  Primary pulmonary process such as idiopathic interstitial pneumonia, RBILD.   #1 At this time I would recommend repeating a CT of his chest since it has been several months since his previous CT scan #2 will start therapy for COPD and will need further assessment of a lot evaluation for his COPD  Start Spiriva Respimat 1.25 and also continue albuterol nebs as needed  #3 check pulmonate function testing and 6 minute walk test along with overnight pulse oximetry to assess his chronic shortness of breath and nocturnal hypoxia  #4  smoking cessation strongly advised  #5 follow-up with infectious disease #6Follow-up with oncology  #7Patient will also need a GI referral for further workup for dysphagia and weight loss      Patient satisfied with Plan of action and management. All questions answered  Corrin Parker, M.D.  [name]  Medical Director [place] Director [facility] Cardio-Pulmonary Department

## 2017-04-22 NOTE — Addendum Note (Signed)
Addended by: Devona Konig on: 04/22/2017 02:48 PM   Modules accepted: Orders

## 2017-04-26 ENCOUNTER — Other Ambulatory Visit: Payer: Self-pay | Admitting: *Deleted

## 2017-04-26 MED ORDER — MEGESTROL ACETATE 40 MG PO TABS: 40.0000 mg | ORAL_TABLET | Freq: Every day | ORAL | 2 refills | Status: DC

## 2017-04-27 ENCOUNTER — Other Ambulatory Visit: Payer: Self-pay | Admitting: *Deleted

## 2017-04-27 NOTE — Telephone Encounter (Signed)
This medicine was filled on 9/17

## 2017-04-29 ENCOUNTER — Inpatient Hospital Stay: Payer: Medicare HMO

## 2017-04-29 ENCOUNTER — Inpatient Hospital Stay: Payer: Medicare HMO | Admitting: Oncology

## 2017-05-13 ENCOUNTER — Ambulatory Visit
Admission: RE | Admit: 2017-05-13 | Discharge: 2017-05-13 | Disposition: A | Discharge status: Home / Self Care | Payer: Medicare HMO | Source: Ambulatory Visit | Attending: Internal Medicine | Admitting: Internal Medicine

## 2017-05-13 ENCOUNTER — Telehealth: Payer: Self-pay | Admitting: *Deleted

## 2017-05-13 ENCOUNTER — Telehealth: Payer: Self-pay | Admitting: Internal Medicine

## 2017-05-13 ENCOUNTER — Ambulatory Visit (HOSPITAL_COMMUNITY): Payer: Medicare HMO

## 2017-05-13 DIAGNOSIS — J849 Interstitial pulmonary disease, unspecified: Secondary | ICD-10-CM | POA: Diagnosis present

## 2017-05-13 DIAGNOSIS — I700 Atherosclerosis of aorta: Secondary | ICD-10-CM | POA: Insufficient documentation

## 2017-05-13 DIAGNOSIS — R059 Cough, unspecified: Secondary | ICD-10-CM

## 2017-05-13 DIAGNOSIS — J984 Other disorders of lung: Secondary | ICD-10-CM | POA: Diagnosis not present

## 2017-05-13 DIAGNOSIS — R05 Cough: Secondary | ICD-10-CM

## 2017-05-13 DIAGNOSIS — J449 Chronic obstructive pulmonary disease, unspecified: Secondary | ICD-10-CM | POA: Diagnosis not present

## 2017-05-13 DIAGNOSIS — F17200 Nicotine dependence, unspecified, uncomplicated: Secondary | ICD-10-CM | POA: Diagnosis not present

## 2017-05-13 DIAGNOSIS — I2510 Atherosclerotic heart disease of native coronary artery without angina pectoris: Secondary | ICD-10-CM | POA: Diagnosis not present

## 2017-05-13 MED ORDER — ALBUTEROL SULFATE (2.5 MG/3ML) 0.083% IN NEBU
2.5000 mg | INHALATION_SOLUTION | Freq: Once | RESPIRATORY_TRACT | Status: AC
  Administered 2017-05-13: 2.5 mg via RESPIRATORY_TRACT
  Filled 2017-05-13: qty 3

## 2017-05-13 NOTE — Telephone Encounter (Signed)
LMOM for pt to return call to inform him he needs 2L O2 QHS per his ONO results and DK. Will await return call.

## 2017-05-13 NOTE — Telephone Encounter (Signed)
Patient returning call.

## 2017-05-13 NOTE — Telephone Encounter (Signed)
[facility] calling with Results from CT Chest.

## 2017-05-13 NOTE — Telephone Encounter (Signed)
[facility] Radiology calling in regards to CT results.

## 2017-05-13 NOTE — Telephone Encounter (Signed)
Informed pt of results. Order placed for night time O2. Nothing further needed.

## 2017-05-14 NOTE — Telephone Encounter (Signed)
I called and left voice message to patient to call back to our office. There was no answer

## 2017-05-14 NOTE — Telephone Encounter (Signed)
Called patient and notified him of bronchoscopy to be scheduled next week

## 2017-05-14 NOTE — Telephone Encounter (Signed)
Will schedule regular bronch per DS.

## 2017-05-14 NOTE — Telephone Encounter (Signed)
Patient returning call  Please call again

## 2017-05-17 NOTE — Telephone Encounter (Signed)
Pt informed of bronch procedure date and time. Nothing further needed.

## 2017-05-18 ENCOUNTER — Other Ambulatory Visit
Admission: RE | Admit: 2017-05-18 | Discharge: 2017-05-18 | Disposition: A | Discharge status: Home / Self Care | Payer: Medicare HMO | Source: Ambulatory Visit | Attending: Gastroenterology | Admitting: Gastroenterology

## 2017-05-18 ENCOUNTER — Ambulatory Visit (INDEPENDENT_AMBULATORY_CARE_PROVIDER_SITE_OTHER): Payer: Medicare HMO | Admitting: Gastroenterology

## 2017-05-18 ENCOUNTER — Encounter: Payer: Self-pay | Admitting: Gastroenterology

## 2017-05-18 VITALS — BP 115/69 | HR 108 | Temp 98.0°F | Ht 71.0 in | Wt 141.8 lb

## 2017-05-18 DIAGNOSIS — R634 Abnormal weight loss: Secondary | ICD-10-CM | POA: Insufficient documentation

## 2017-05-18 LAB — TSH: TSH: 0.573 u[IU]/mL (ref 0.350–4.500)

## 2017-05-18 NOTE — Addendum Note (Signed)
Addended by: Peggye Ley on: 05/18/2017 04:28 PM   Modules accepted: Orders, SmartSet

## 2017-05-18 NOTE — Progress Notes (Signed)
Tony Bellows MD, MRCP(U.K) [address]  [place]  [place], [place] [address]  Main: [phone]  Fax: [phone]   Gastroenterology Consultation  Referring Provider:     Owens Loffler, MD Primary Care Physician:  Owens Loffler, MD Primary Gastroenterologist:  Dr. Jonathon Watson  Reason for Consultation:     Dysphagia , weight loss         HPI:   Tony Watson is a 66 y.o. y/o male referred for consultation & management  by Dr. Owens Loffler, MD.    He has been referred for dysphagia and weight loss. He has a history of CMML and follows with Oncology hospital. H/o smoking for 100-150 pack years.   02/24/17- CT abdomen -mild mediastinal lymphadenopathy . Normal Abdomen and pelvis 05/13/17- Ct chest : Chronic lung disease   Labs  04/13/17- Hb 9.3 with MCV 93,INR 1.32.BMP-normal   Dysphagia: Onset and any progression: 06/2016  Frequency: occurs when he is eating meat or taking pills, Does not affect water as much , 4-5 times a week  Not getting worse  Prior episodes of impaction: no  History of asthma/allergy : yes- bronchitis-  History of heartburn/Reflux : yes  Weight loss/weight gain : past 1 year he has lost 50 lbs   Prior EGD:  No  PPI/H2 blocker use : Prilosec - everyday - helps with heartburn.  Last colonoscopy was 7-8 years back - Was told it was "fine", no family history of colon cancer/polyps     Past Medical History:  Diagnosis Date  . Arthritis   . Asthma   . Bronchitis   . CAD (coronary artery disease)    inferior MI 11/99 (in [place]) with PCI to [facility]. last [place] 2004 [facility]): EF 50%, patent RCA stent. no obstructive disease. last myoview 2008: EF 42%, inferior infarct, no ischemia.   . 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 COPD (chronic obstructive pulmonary disease) ([place])   . Cough    CHRONIC  . Diabetic autonomic neuropathy associated with type 2 diabetes mellitus ([place]) 03/27/2015  . Diverticulosis 2002  . DM2 (diabetes mellitus, type 2) ([facility])      A1c 6.0% 12/11  . Dyspnea   . Edema   . GERD (gastroesophageal reflux disease)   . H/O wheezing   . HTN (hypertension)   . Hyperlipidemia   . Ischemic cardiomyopathy    mild with EF 42% on myoviews 2008.  . Medical non-compliance   . Myocardial infarction ([place])   . Nicotine dependence    chronic, active  . Pneumonia    in the past  . Seasonal allergic rhinitis   . Sleep apnea   . Smoker   . Spinal stenosis   . Stroke ([place])    RECURRENT AFFECTING MEMORY  . Systolic heart failure secondary to coronary artery disease ([place]) 05/30/2012  . TIA (transient ischemic attack)    hx; now s/p PFO closure 2004 (in [place])  . Ulcer     Past Surgical History:  Procedure Laterality Date  . BACK SURGERY     patient denies-just lumbar punctures  . CARDIAC CATHETERIZATION  11/99   CI per [facility]  . CATARACT EXTRACTION W/PHACO Right 11/24/2016   Procedure: CATARACT EXTRACTION PHACO AND INTRAOCULAR LENS PLACEMENT (IOC);  Surgeon: Birder Robson, MD;  Location: [facility] ORS;  Service: Ophthalmology;  Laterality: Right;  [place] 1:04.3 AP% 22.3 CDE 14.35 Fluid pack lot # [id number]  . CATARACT EXTRACTION W/PHACO Left 12/29/2016   Procedure: CATARACT EXTRACTION  PHACO AND INTRAOCULAR LENS PLACEMENT (IOC);  Surgeon: Birder Robson, MD;  Location: [facility] ORS;  Service: Ophthalmology;  Laterality: Left;  [place] 00:52 AP% 18.5 CDE 9.67 Fluid pack lot # [id number]  . CORONARY ANGIOPLASTY     STENT  . CORONARY ARTERY BYPASS GRAFT    . EYE SURGERY    . open heart surgery  2004   PFO repair    Prior to Admission medications   Medication Sig Start Date End Date Taking? Authorizing Provider  acetaminophen (TYLENOL) 500 MG tablet Take 1,000 mg by mouth daily as needed for moderate pain or headache.   Yes [provider]  amoxicillin-clavulanate (AUGMENTIN) 875-125 MG tablet TAKE 1 TABLET (875 MG TOTAL) BY MOUTH 2 (TWO) TIMES DAILY FOR 14 DAYS. 04/22/17  Yes [provider]  carvedilol (COREG) 3.125  MG tablet TAKE 1 TABLET (3.125 MG TOTAL) BY MOUTH 2 (TWO) TIMES DAILY. 03/19/17  Yes Copland, Frederico Hamman, MD  Cholecalciferol (VITAMIN D) 2000 units CAPS Take 2,000 Units by mouth every evening.   Yes [provider]  gabapentin (NEURONTIN) 300 MG capsule Take 1 capsule (300 mg total) by mouth 2 (two) times daily. Patient taking differently: Take 300 mg by mouth at bedtime.  07/22/16  Yes Copland, Frederico Hamman, MD  Glucosamine HCl 1000 MG TABS Take 1,000 mg by mouth every evening.   Yes [provider]  ipratropium-albuterol (DUONEB) 0.5-2.5 (3) MG/3ML SOLN TAKE 3 MLS BY NEBULIZATION EVERY 4 (FOUR) HOURS AS NEEDED. 03/29/17  Yes Copland, Spencer, MD  Magnesium 250 MG TABS Take 250 mg by mouth every evening.   Yes [provider]  megestrol (MEGACE) 40 MG tablet Take 1 tablet (40 mg total) by mouth daily. 04/26/17  Yes Lloyd Huger, MD  metFORMIN (GLUCOPHAGE-XR) 500 MG 24 hr tablet TAKE 1 TABLET (500 MG TOTAL) BY MOUTH DAILY WITH BREAKFAST. 03/19/17  Yes Copland, Frederico Hamman, MD  omeprazole (PRILOSEC) 20 MG capsule Take 20 mg by mouth daily.   Yes [provider]  oxymetazoline (AFRIN) 0.05 % nasal spray Place 1 spray into both nostrils daily as needed for congestion.   Yes [provider]  pseudoephedrine (SUDAFED) 30 MG tablet Take 30 mg by mouth daily as needed for congestion.    Yes [provider]  tiZANidine (ZANAFLEX) 4 MG tablet TAKE 1 TABLET (4 MG TOTAL) BY MOUTH NIGHTLY. 03/21/17  Yes Copland, Frederico Hamman, MD  SYMBICORT 160-4.5 MCG/ACT inhaler INHALE 2 PUFFS INTO THE LUNGS 2 (TWO) TIMES DAILY. Patient not taking: Reported on 05/18/2017 09/21/16   Copland, Frederico Hamman, MD  Tiotropium Bromide Monohydrate (SPIRIVA RESPIMAT) 1.25 MCG/ACT AERS Inhale 2 puffs into the lungs daily. Patient not taking: Reported on 05/18/2017 04/22/17   [name], MD    Family History  Problem Relation Age of Onset  . Coronary artery disease Unknown        family hx  . Breast  cancer Unknown        1st egree relative <50     Social History  Substance Use Topics  . Smoking status: Current Every Day Smoker    Packs/day: 1.00    Years: 45.00    Types: Cigarettes  . Smokeless tobacco: Never Used     Comment: 1 ppd +40 years  . Alcohol use 0.0 oz/week     Comment: beer weekly    Allergies as of 05/18/2017  . (No Known Allergies)    Review of Systems:    All systems reviewed and negative except where noted in  HPI.   Physical Exam:  BP 115/69 (BP Location: Left Arm, Patient Position: Sitting, Cuff Size: Normal)   Pulse (!) 108   Temp 98 F (36.7 C) (Oral)   Ht 5\' 11"  (1.803 m)   Wt 141 lb 12.8 oz (64.3 kg)   BMI 19.78 kg/m  No LMP for male patient. Psych:  Alert and cooperative. Normal mood and affect. General:   Alert,  Well-developed, very thin , cachectic pleasant and cooperative in NAD Head:  Normocephalic and atraumatic. Eyes:  Sclera clear, no icterus.   Conjunctiva pink. Ears:  Normal auditory acuity. Nose:  No deformity, discharge, or lesions. Mouth:  No deformity or lesions,oropharynx pink & moist. Neck:  Supple; no masses or thyromegaly. Lungs:  Poor air entry b/l .No added breath sounds  Heart:  Regular rate and rhythm; no murmurs, clicks, rubs, or gallops. Abdomen:  Normal bowel sounds.  No bruits.  Soft, non-tender and non-distended without masses, hepatosplenomegaly or hernias noted.  No guarding or rebound tenderness.    Neurologic:  Alert and oriented x3;  grossly normal neurologically. Skin:  Intact without significant lesions or rashes. No jaundice. Lymph Nodes:  No significant cervical adenopathy. Psych:  Alert and cooperative. Normal mood and affect.  Imaging Studies: Ct Chest Wo Contrast  Result Date: 05/13/2017 CLINICAL DATA:  66 year old male with history of persistent cough and chest tightness. History of asthma. Smoker. EXAM: CT CHEST WITHOUT CONTRAST TECHNIQUE: Multidetector CT imaging of the chest was performed  following the standard protocol without IV contrast. COMPARISON:  Chest CT 02/24/2017. FINDINGS: Cardiovascular: Heart size is normal. There is no significant pericardial fluid, thickening or pericardial calcification. There is aortic atherosclerosis, as well as atherosclerosis of the great vessels of the mediastinum and the coronary arteries, including calcified atherosclerotic plaque in the left main, left anterior descending, left circumflex and right coronary arteries. Calcifications of the aortic valve (mild). Mediastinum/Nodes: Multiple borderline enlarged and mildly enlarged mediastinal and bilateral hilar lymph nodes measuring up to 12 mm in short axis in the low right paratracheal nodal station, similar to the prior study. Esophagus is unremarkable in appearance. No axillary lymphadenopathy. Lungs/Pleura: Again noted are mid to upper lung predominant findings of innumerable thick-walled bizarre shaped cysts and extensive centrilobular micronodularity (with many of these tiny nodules demonstrating central cavitation). 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 No confluent consolidative airspace disease. No pleural effusions. Upper Abdomen: Aortic atherosclerosis. Musculoskeletal: There are no aggressive appearing lytic or blastic lesions noted in the visualized portions of the skeleton. Median sternotomy wires. IMPRESSION: 1. Progressive chronic lung disease almost certainly reflective of a smoking related lung disease. 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 Counseling for smoking cessation is strongly recommended. 2. Aortic atherosclerosis, in addition to left main and 3 vessel coronary artery disease. Please note that although the presence of coronary artery calcium documents the presence of coronary  artery disease, the severity of this disease and any potential stenosis cannot be assessed on this non-gated CT examination. Assessment for potential risk factor modification, dietary therapy or pharmacologic therapy may be warranted, if clinically indicated. 3. There are calcifications of the aortic valve. Echocardiographic correlation for evaluation of potential valvular dysfunction may be warranted if clinically indicated. These results will be called to the ordering clinician or representative by the Radiologist Assistant, and communication documented in the PACS or zVision Dashboard. Aortic  Atherosclerosis (ICD10-I70.0). Electronically Signed   By: Vinnie Langton M.D.   On: 05/13/2017 14:31    Assessment and Plan:   NICKALAS MCCARRICK is a 66 y.o. y/o male has been referred for weight loss and dysphagia.   1. Check TSH,celiac serology  2. EGD+colonoscopy for unintentional weight loss I have discussed alternative options, risks & benefits,  which include, but are not limited to, bleeding, infection, perforation,respiratory complication & drug reaction.  The patient agrees with this plan & written consent will be obtained.     Follow up in 8-12 weeks  Dr Tony Bellows MD,MRCP(U.K)

## 2017-05-19 ENCOUNTER — Telehealth: Payer: Self-pay | Admitting: *Deleted

## 2017-05-19 ENCOUNTER — Ambulatory Visit: Payer: Medicare HMO

## 2017-05-19 ENCOUNTER — Ambulatory Visit
Admission: RE | Admit: 2017-05-19 | Discharge: 2017-05-19 | Disposition: A | Discharge status: Home / Self Care | Payer: Medicare HMO | Source: Ambulatory Visit | Attending: Pulmonary Disease | Admitting: Pulmonary Disease

## 2017-05-19 ENCOUNTER — Encounter: Payer: Self-pay | Admitting: Emergency Medicine

## 2017-05-19 ENCOUNTER — Encounter
Admission: RE | Disposition: A | Discharge status: Home / Self Care | Payer: Self-pay | Source: Ambulatory Visit | Attending: Pulmonary Disease

## 2017-05-19 DIAGNOSIS — E1143 Type 2 diabetes mellitus with diabetic autonomic (poly)neuropathy: Secondary | ICD-10-CM | POA: Diagnosis not present

## 2017-05-19 DIAGNOSIS — Z951 Presence of aortocoronary bypass graft: Secondary | ICD-10-CM | POA: Diagnosis not present

## 2017-05-19 DIAGNOSIS — Z791 Long term (current) use of non-steroidal anti-inflammatories (NSAID): Secondary | ICD-10-CM | POA: Insufficient documentation

## 2017-05-19 DIAGNOSIS — I252 Old myocardial infarction: Secondary | ICD-10-CM | POA: Insufficient documentation

## 2017-05-19 DIAGNOSIS — Z79899 Other long term (current) drug therapy: Secondary | ICD-10-CM | POA: Diagnosis not present

## 2017-05-19 DIAGNOSIS — J440 Chronic obstructive pulmonary disease with acute lower respiratory infection: Secondary | ICD-10-CM | POA: Insufficient documentation

## 2017-05-19 DIAGNOSIS — I255 Ischemic cardiomyopathy: Secondary | ICD-10-CM | POA: Diagnosis not present

## 2017-05-19 DIAGNOSIS — R634 Abnormal weight loss: Secondary | ICD-10-CM | POA: Insufficient documentation

## 2017-05-19 DIAGNOSIS — Z9119 Patient's noncompliance with other medical treatment and regimen: Secondary | ICD-10-CM | POA: Diagnosis not present

## 2017-05-19 DIAGNOSIS — F17210 Nicotine dependence, cigarettes, uncomplicated: Secondary | ICD-10-CM | POA: Insufficient documentation

## 2017-05-19 DIAGNOSIS — E785 Hyperlipidemia, unspecified: Secondary | ICD-10-CM | POA: Insufficient documentation

## 2017-05-19 DIAGNOSIS — G4730 Sleep apnea, unspecified: Secondary | ICD-10-CM | POA: Insufficient documentation

## 2017-05-19 DIAGNOSIS — M1990 Unspecified osteoarthritis, unspecified site: Secondary | ICD-10-CM | POA: Insufficient documentation

## 2017-05-19 DIAGNOSIS — J189 Pneumonia, unspecified organism: Secondary | ICD-10-CM | POA: Insufficient documentation

## 2017-05-19 DIAGNOSIS — I10 Essential (primary) hypertension: Secondary | ICD-10-CM | POA: Diagnosis not present

## 2017-05-19 DIAGNOSIS — K219 Gastro-esophageal reflux disease without esophagitis: Secondary | ICD-10-CM | POA: Diagnosis not present

## 2017-05-19 DIAGNOSIS — R1310 Dysphagia, unspecified: Secondary | ICD-10-CM | POA: Diagnosis not present

## 2017-05-19 DIAGNOSIS — Z7984 Long term (current) use of oral hypoglycemic drugs: Secondary | ICD-10-CM | POA: Diagnosis not present

## 2017-05-19 DIAGNOSIS — I2510 Atherosclerotic heart disease of native coronary artery without angina pectoris: Secondary | ICD-10-CM | POA: Insufficient documentation

## 2017-05-19 HISTORY — PX: FLEXIBLE BRONCHOSCOPY: SHX5094

## 2017-05-19 LAB — GLUCOSE, CAPILLARY: GLUCOSE-CAPILLARY: 94 mg/dL (ref 65–99)

## 2017-05-19 SURGERY — BRONCHOSCOPY, FLEXIBLE
Anesthesia: Moderate Sedation

## 2017-05-19 MED ORDER — FENTANYL CITRATE (PF) 100 MCG/2ML IJ SOLN
INTRAMUSCULAR | Status: AC
  Filled 2017-05-19: qty 4

## 2017-05-19 MED ORDER — LIDOCAINE HCL 2 % EX GEL: 1.0000 "application " | Freq: Once | CUTANEOUS | Status: DC

## 2017-05-19 MED ORDER — MIDAZOLAM HCL 2 MG/2ML IJ SOLN
INTRAMUSCULAR | Status: AC | PRN
  Administered 2017-05-19 (×2): 2 mg via INTRAVENOUS

## 2017-05-19 MED ORDER — FENTANYL CITRATE (PF) 100 MCG/2ML IJ SOLN
INTRAMUSCULAR | Status: AC | PRN
  Administered 2017-05-19: 25 ug via INTRAVENOUS

## 2017-05-19 MED ORDER — PHENYLEPHRINE HCL 0.25 % NA SOLN
1.0000 | Freq: Four times a day (QID) | NASAL | Status: DC | PRN
  Filled 2017-05-19: qty 15

## 2017-05-19 MED ORDER — MIDAZOLAM HCL 5 MG/5ML IJ SOLN
INTRAMUSCULAR | Status: AC
  Filled 2017-05-19: qty 10

## 2017-05-19 MED ORDER — BUTAMBEN-TETRACAINE-BENZOCAINE 2-2-14 % EX AERO
1.0000 | INHALATION_SPRAY | Freq: Once | CUTANEOUS | Status: DC
  Filled 2017-05-19: qty 20

## 2017-05-19 NOTE — Discharge Instructions (Signed)
Flexible Bronchoscopy, Care After °This sheet gives you information about how to care for yourself after your procedure. Your health care provider may also give you more specific instructions. If you have problems or questions, contact your health care provider. °What can I expect after the procedure? °After the procedure, it is common to have the following symptoms for 24-48 hours: °· A cough that is worse than it was before the procedure. °· A low-grade fever. °· A sore throat or hoarse voice. °· Small streaks of blood in the mucus from your lungs (sputum), if tissue samples were removed (biopsy). ° °Follow these instructions at home: °Eating and drinking °· Do not eat or drink anything (including water) for 2 hours after your procedure, or until your numbing medicine (local anesthetic) has worn off. Having a numb throat increases your risk of burning yourself or choking. °· After your numbness is gone and your cough and gag reflexes have returned, you may start eating only soft foods and slowly drinking liquids. °· The day after the procedure, return to your normal diet. °Driving °· Do not drive for 24 hours if you were given a medicine to help you relax (sedative). °· Do not drive or use heavy machinery while taking prescription pain medicine. °General instructions °· Take over-the-counter and prescription medicines only as told by your health care provider. °· Return to your normal activities as told by your health care provider. Ask your health care provider what activities are safe for you. °· Do not use any products that contain nicotine or tobacco, such as cigarettes and e-cigarettes. If you need help quitting, ask your health care provider. °· Keep all follow-up visits as told by your health care provider. This is important, especially if you had a biopsy taken. °Get help right away if: °· You have shortness of breath that gets worse. °· You become light-headed or feel like you might faint. °· You have  chest pain. °· You cough up more than a small amount of blood. °· The amount of blood you cough up increases. °Summary °· Common symptoms in the 24-48 hours following a flexible bronchoscopy include cough, low-grade fever, sore throat or hoarse voice, and blood-streaked mucus from the lungs (if you had a biopsy). °· Do not eat or drink anything (including water) for 2 hours after your procedure, or until your local anesthetic has worn off. You can return to your normal diet the day after the procedure. °· Get help right away if you develop worsening shortness of breath, have chest pain, become light-headed, or cough up more than a small amount of blood. °This information is not intended to replace advice given to you by your health care provider. Make sure you discuss any questions you have with your health care provider. °Document Released: 02/13/2005 Document Revised: 08/14/2016 Document Reviewed: 08/14/2016 °Elsevier Interactive Patient Education © 2017 Elsevier Inc. °Moderate Conscious Sedation, Adult, Care After °These instructions provide you with information about caring for yourself after your procedure. Your health care provider may also give you more specific instructions. Your treatment has been planned according to current medical practices, but problems sometimes occur. Call your health care provider if you have any problems or questions after your procedure. °What can I expect after the procedure? °After your procedure, it is common: °· To feel sleepy for several hours. °· To feel clumsy and have poor balance for several hours. °· To have poor judgment for several hours. °· To vomit if you eat too soon. ° °  Follow these instructions at home: For at least 24 hours after the procedure:   Do not: ? Participate in activities where you could fall or become injured. ? Drive. ? Use heavy machinery. ? Drink alcohol. ? Take sleeping pills or medicines that cause drowsiness. ? Make important decisions  or sign legal documents. ? Take care of children on your own.  Rest. Eating and drinking  Follow the diet recommended by your health care provider.  If you vomit: ? Drink water, juice, or soup when you can drink without vomiting. ? Make sure you have little or no nausea before eating solid foods. General instructions  Have a responsible adult stay with you until you are awake and alert.  Take over-the-counter and prescription medicines only as told by your health care provider.  If you smoke, do not smoke without supervision.  Keep all follow-up visits as told by your health care provider. This is important. Contact a health care provider if:  You keep feeling nauseous or you keep vomiting.  You feel light-headed.  You develop a rash.  You have a fever. Get help right away if:  You have trouble breathing. This information is not intended to replace advice given to you by your health care provider. Make sure you discuss any questions you have with your health care provider. Document Released: 05/17/2013 Document Revised: 12/30/2015 Document Reviewed: 11/16/2015 Elsevier Interactive Patient Education  [employer].

## 2017-05-19 NOTE — Interval H&P Note (Signed)
History and Physical Interval Note:  05/19/2017 1:16 PM  [name]  has presented today for surgery, with the diagnosis of Regular Bronchoscopy  Lung mass [facility] emailed   C-arm  The various methods of treatment have been discussed with the patient and family. After consideration of risks, benefits and other options for treatment, the patient has consented to  Procedure(s): FLEXIBLE BRONCHOSCOPY (N/A) as a surgical intervention .  The patient's history has been reviewed, patient examined, no change in status, stable for surgery.  I have reviewed the patient's chart and labs.  Questions were answered to the patient's satisfaction.    Merton Border, MD PCCM service Mobile [phone] Pager [phone] 05/19/2017 1:16 PM

## 2017-05-19 NOTE — Telephone Encounter (Addendum)
Dr Vicente Males needs a return call regarding upper and lower Colonoscopy clearance

## 2017-05-19 NOTE — Telephone Encounter (Signed)
Notified Panya of Dr Gary Fleet response

## 2017-05-19 NOTE — Progress Notes (Signed)
Pt sitting up in bed at this time, drinking without difficulty in NAD.  Pt o2sat initially 89% on RA upon coming to recovery, 2L oxygen via [place] placed at that time then pt weaned off oxygen.  Pt currently on RA w/ o2sat of 96%.  VSS at this time.  Discharge and follow up instructions reviewed, return precautions reviewed, pt and wife verbalize understanding.

## 2017-05-19 NOTE — Telephone Encounter (Signed)
Clearance for what?  Proceed with colonoscopy as indicated.  CMML should not affect the procedure.

## 2017-05-19 NOTE — Procedures (Signed)
Indication:   Pneumonitis, diffuse interstitial disease  Premedication:   Fentanyl 25 mcg Midaz 4 mg  Anesthesia: Topical to nose and throat 40 cc of 1% lidocaine used during the course of procedure  Procedure: After adequate sedation and anesthesia, the bronchoscope was introduced via the R naris and advanced into the posterior pharynx. Further anesthesia was obtained with 1% lidocaine and the scope was advanced into the trachea. Complete airway anesthesia was achieved with 1% lidocaine and a thorough airway examination was performed. This revealed the following findings:  Findings:  Upper airway - cobblestoning Tracheobronchial anatomy - normal Bronchial mucosa - acute and chronic bronchitic changes Other - no endobronchial tumors, masses or foreign bodies. Mild mucoid secretions throughout  Specimens:   BAL from RML sent for micro, AFB, fungal, pneumocystis Transbronchial biopsy (with flouroscopic guidance) from RLL  Complications: none  Post procedure evaluation:  The patient tolerated the procedure well with no major complications CXR - none. Chest scanned with C arm after procedure without evidence of PTX   Merton Border, MD PCCM service Mobile [phone] Pager [phone]  05/19/2017 2:38 PM

## 2017-05-19 NOTE — Sedation Documentation (Signed)
Patient post bronchoscopy, tolerated well, vitals remained stable throughout entire procedure. Awakens to spoken voice, denies complaints, minimal to no bleeding per procedure with Dr Alva Garnet. Will transfer to specials to finish recovery prior to discharge.

## 2017-05-19 NOTE — Sedation Documentation (Signed)
Right middle lobe B

## 2017-05-19 NOTE — H&P (View-Only) (Signed)
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  He has had ongoing weight loss and issues with fatigue and DOE..  In March started to feel full and having no appetite as well as fatigue, no energy. Weight loss started around that time. Down 50 pounds since last year. Has chronic cough but no change. Did have some worsening DOE. 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  He has a very extensive smoking history smokes about 2-3 packs a day for approximately 50-60 years   IN Nov was having frequent Sore throat T episodes followed by episodes of hoarseness.  These episodes have occurred over and over again. Occas gest some burning feeling on tongue.  Was seen by ENT who did scope and said uvula had spot and he had bxp of it which he says was benign then had aspiration and bxp of L neck LN all inconclusive.  Had recent BM BXP as well. He has no fevers, very occas chills at night but night sweats. Feels cold all the time.  Treatments in past- had prednisone course in Nov and felt good after it. Has not had repeated course. Antibiotics - none since nov. Patient being taken care of by Dr. Grayland Ormond in oncology   Denies headaches, neck pain, Skin lesions, vision changes (except cataract surgery in June). No obvious LAN at this point. No chest pain. No abd pain, nausea or diarrhea. No dysuria. No joint pain or swelling.   Has had repeated PNAs as well and  intermittent abnormalities on CT chest.   Does report some dysphagia, feels like liquid get stuck in upper esophagus, hurts but then passes. Has to eat small bites.  Pets- 4 dogs. Marina Gravel - has a cockatiel. No cats. Lives in city with his wife. No other animal exposures  Travel - in [place] last year. Originally from [place] then [facility] then to [place] 14 years.  Hobbies- prior camping but none in 3 years. No other hobbies.  Was for a brief time 3 years in [employer], never abroad.   At this time patient has chronic shortness of breath and dyspnea on exertion with intermittent wheezing he states that his nebulizer therapy does help however he has used Symbicort in the past which does not help patient continues to smoke 1 pack per day Patient was prescribed prednisone therapy approximately 9 months ago and he states he felt a lot better when he received prednisone therapy  At this time is no signs of infection Does no signs of acute heart failure at this time   PAST MEDICAL HISTORY :   has a past medical history of Arthritis; Asthma; Bronchitis; CAD (coronary artery disease); Cancer [facility]); COPD (chronic obstructive pulmonary disease) ([place]); Cough; Diabetic autonomic neuropathy associated with type 2 diabetes mellitus ([place]) (03/27/2015); Diverticulosis (2002); DM2 (diabetes mellitus, type 2) ([place]); Dyspnea; Edema; GERD (gastroesophageal reflux disease); H/O wheezing; HTN (hypertension); Hyperlipidemia; Ischemic cardiomyopathy; Medical non-compliance; Myocardial infarction [facility]); Nicotine  dependence; Pneumonia; Seasonal allergic rhinitis; Sleep apnea; Smoker; Spinal stenosis; Stroke [facility]); 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  has a past surgical history that includes open heart surgery (2004); Cardiac catheterization (11/99); Coronary artery bypass graft; Coronary angioplasty; Eye surgery; Cataract  extraction w/PHACO (Right, 11/24/2016); Cataract extraction w/PHACO (Left, 12/29/2016); and Back surgery. Prior to Admission medications   Medication Sig Start Date End Date Taking? Authorizing Provider  acetaminophen (TYLENOL) 500 MG tablet Take 1,000 mg by mouth daily as needed for moderate pain or headache.   Yes [provider]  carvedilol (COREG) 3.125 MG tablet TAKE 1 TABLET (3.125 MG TOTAL) BY MOUTH 2 (TWO) TIMES DAILY. 03/19/17  Yes Copland, Frederico Hamman, MD  Cholecalciferol (VITAMIN D) 2000 units CAPS Take 2,000 Units by mouth every evening.   Yes [provider]  gabapentin (NEURONTIN) 300 MG capsule Take 1 capsule (300 mg total) by mouth 2 (two) times daily. Patient taking differently: Take 300 mg by mouth at bedtime.  07/22/16  Yes Copland, Frederico Hamman, MD  gabapentin (NEURONTIN) 300 MG capsule TAKE 1 CAPSULE (300 MG TOTAL) BY MOUTH AT BEDTIME. 03/21/17  Yes Copland, Spencer, MD  Glucosamine HCl 1000 MG TABS Take 1,000 mg by mouth every evening.   Yes [provider]  ipratropium-albuterol (DUONEB) 0.5-2.5 (3) MG/3ML SOLN TAKE 3 MLS BY NEBULIZATION EVERY 4 (FOUR) HOURS AS NEEDED. 03/29/17  Yes Copland, Spencer, MD  Magnesium 250 MG TABS Take 250 mg by mouth every evening.   Yes [provider]  megestrol (MEGACE) 40 MG tablet TAKE 1 TABLET BY MOUTH EVERY DAY 04/01/17  Yes Lloyd Huger, MD  metFORMIN (GLUCOPHAGE-XR) 500 MG 24 hr tablet TAKE 1 TABLET (500 MG TOTAL) BY MOUTH DAILY WITH BREAKFAST. 03/19/17  Yes Copland, Frederico Hamman, MD  omeprazole (PRILOSEC) 20 MG capsule Take 20 mg by mouth daily.   Yes [provider]  oxymetazoline (AFRIN) 0.05 % nasal spray Place 1 spray into both nostrils daily as needed for congestion.   Yes [provider]  pseudoephedrine (SUDAFED) 30 MG tablet Take 30 mg by mouth daily as needed for congestion.    Yes [provider]  SYMBICORT 160-4.5 MCG/ACT inhaler INHALE 2 PUFFS INTO THE LUNGS 2 (TWO) TIMES  DAILY. 09/21/16  Yes Copland, Frederico Hamman, MD  tiZANidine (ZANAFLEX) 4 MG tablet TAKE 1 TABLET (4 MG TOTAL) BY MOUTH NIGHTLY. 03/21/17  Yes Copland, Frederico Hamman, MD   No Known Allergies  FAMILY HISTORY:  family history includes Breast cancer in his unknown relative; Coronary artery disease in his unknown relative. SOCIAL HISTORY:  reports that he has been smoking Cigarettes.  He has a 45.00 pack-year smoking history. He has never used smokeless tobacco. He reports that he drinks alcohol. He reports that he does not use drugs.  REVIEW OF SYSTEMS:   Constitutional: Negative for fever, chills, + weight loss, + malaise/fatigue and + diaphoresis.  HENT: Negative for hearing loss, ear pain, nosebleeds, congestion, sore throat, neck pain, tinnitus and ear discharge.   Eyes: Negative for blurred vision, double vision, photophobia, pain, discharge and redness.  Respiratory: Negative for cough, hemoptysis, sputum production, +shortness of breath,+ wheezing and stridor.   Cardiovascular: Negative for chest pain, palpitations, orthopnea, claudication, leg swelling and PND.  Gastrointestinal: Negative for heartburn, nausea, vomiting, abdominal pain, diarrhea, constipation, blood in stool and melena.  Genitourinary: Negative for dysuria, urgency, frequency, hematuria and flank pain.  Musculoskeletal: Negative for myalgias, back pain, joint pain and falls.  Skin: Negative for itching and rash.  Neurological: Negative for dizziness, tingling, tremors, sensory change, speech change, focal weakness, seizures, loss of consciousness, weakness and headaches.  Endo/Heme/Allergies: Negative for environmental allergies and polydipsia. Does not bruise/bleed easily.  ALL OTHER ROS ARE NEGATIVE    VITAL SIGNS: BP 110/60 (BP Location: Left Arm, Cuff Size: Normal)   Pulse (!) 116   Resp 16   Ht _0  (1.803 m)   Wt 143 lb (64.9 kg)   SpO2 97%   BMI 19.94 kg/m    Physical Examination:   GENERAL:NAD, no fevers,  chills, +thin +cachexia HEAD: Normocephalic, atraumatic.  EYES: Pupils equal, round, reactive to light. Extraocular muscles intact. No scleral icterus.  MOUTH: Moist mucosal membrane.   EAR, NOSE, THROAT: Clear without exudates. No external lesions.  NECK: Supple. No thyromegaly. No nodules. No JVD.  PULMONARY:CTA B/L no wheezes, no crackles, no rhonchi CARDIOVASCULAR: S1 and S2. Regular rate and rhythm. No murmurs, rubs, or gallops. No edema.  GASTROINTESTINAL: Soft, nontender, nondistended. No masses. Positive bowel sounds.  MUSCULOSKELETAL: No swelling, clubbing, or edema. Range of motion full in all extremities.  NEUROLOGIC: Cranial nerves II through XII are intact. No gross focal neurological deficits.  SKIN: No ulceration, lesions, rashes, or cyanosis. Skin warm and dry. Turgor intact.  PSYCHIATRIC: Mood, affect within normal limits. The patient is awake, alert and oriented x 3. Insight, judgment intact.      ASSESSMENT / PLAN: 66 y.o. male here for new patient infectious diseases consultation for weight loss of up to 50 pounds and abnormal CT in a patient with a 3-4 year history of indolent CMML not on treatment as well as COPD, prior heavy and current tobacco use, CAD. Interestingly he has had PNA before and had an abnormal CT scan of chest in 2016 with right upper lobe infiltrate and and its atypical appearance at that time. He is also had recurrent episodes of pharyngitis and hoarseness, he seems to have some issues with dysphasia.  He did have recently repeat bone marrow biopsy which just confirmed the findings on previous showing CMML. Patient was also evaluated by infectious disease and supposedly all lab tests and workup have been nondiagnostic at this time which includes fungal serologies TB testing and HIV testing   He does have some significant exposure to mold when he was working in [employer]. He also has a pet bird. His only other animal exposures dogs.    Differential diagnosis for this constellation of symptoms and CT findings is brought but includes mycobacterial diseases such as tuberculosis or Mycobacterium avium, fungal infections such as histoplasmosis, cryptococcus, Aspergillus, or atypical pathogen such as chlamydia psittacosis. Given his history of dysphasia and weight loss I also wonder if he may be having some repeated aspirations. His dysphagia is very concerning to me and high probability that he may have underlying esophageal cancer or may even have a gastrointestinal cancer which needs to be ruled out  Primary pulmonary process such as idiopathic interstitial pneumonia, RBILD.   #1 At this time I would recommend repeating a CT of his chest since it has been several months since his previous CT scan #2 will start therapy for COPD and will need further assessment of a lot evaluation for his COPD  Start Spiriva Respimat 1.25 and also continue albuterol nebs as needed  #3 check pulmonate function testing and 6 minute walk test along with overnight pulse oximetry to assess his chronic shortness of breath and nocturnal hypoxia  #4  smoking cessation strongly advised  #5 follow-up with infectious disease #6Follow-up with oncology  #7Patient will also need a GI referral for further workup for dysphagia and weight loss      Patient satisfied with Plan of action and management. All questions answered  Corrin Parker, M.D.  [name]  Medical Director [place] Director [facility] Cardio-Pulmonary Department

## 2017-05-20 ENCOUNTER — Encounter: Payer: Self-pay | Admitting: Pulmonary Disease

## 2017-05-20 LAB — CELIAC DISEASE PANEL
ENDOMYSIAL ANTIBODY IGA: NEGATIVE
IgA: 1411 mg/dL — ABNORMAL HIGH (ref 61–437)
Tissue Transglutaminase Ab, IgA: <2 U/mL (ref 0–3)

## 2017-05-20 LAB — CYTOLOGY - NON PAP

## 2017-05-21 LAB — ACID FAST SMEAR (AFB): ACID FAST SMEAR - AFSCU2: NEGATIVE

## 2017-05-21 LAB — SURGICAL PATHOLOGY

## 2017-05-21 LAB — ACID FAST SMEAR (AFB, MYCOBACTERIA)

## 2017-05-22 LAB — CULTURE, BAL-QUANTITATIVE W GRAM STAIN: Culture: 40000 — AB

## 2017-05-24 ENCOUNTER — Telehealth: Payer: Self-pay

## 2017-05-24 ENCOUNTER — Encounter: Payer: Self-pay | Admitting: Internal Medicine

## 2017-05-24 ENCOUNTER — Ambulatory Visit (INDEPENDENT_AMBULATORY_CARE_PROVIDER_SITE_OTHER): Payer: Medicare HMO | Admitting: Internal Medicine

## 2017-05-24 ENCOUNTER — Encounter: Payer: Self-pay | Admitting: *Deleted

## 2017-05-24 ENCOUNTER — Ambulatory Visit: Payer: Medicare HMO | Admitting: Internal Medicine

## 2017-05-24 VITALS — BP 128/70 | HR 117 | Ht 71.0 in | Wt 139.0 lb

## 2017-05-24 DIAGNOSIS — J449 Chronic obstructive pulmonary disease, unspecified: Secondary | ICD-10-CM | POA: Diagnosis not present

## 2017-05-24 DIAGNOSIS — J84115 Respiratory bronchiolitis interstitial lung disease: Secondary | ICD-10-CM | POA: Diagnosis not present

## 2017-05-24 MED ORDER — VARENICLINE TARTRATE 0.5 MG X 11 & 1 MG X 42 PO MISC: ORAL | 0 refills | Status: DC

## 2017-05-24 NOTE — Progress Notes (Signed)
Name: ELIZJAH NOBLET MRN: [id number] DOB: 06/30/1951     CONSULTATION DATE: 05/24/2017   REFERRING MD : Grayland Ormond  CHIEF COMPLAINT: fatigue   STUDIES:  CT chest 02/2017 I have Independently reviewed images of  CT chest  Interpretation: Bilateral multifocal micronodular densities and opacifications noted   Patient underwent diagnostic bronchoscopy last week 05/19/17 Patient's pathology and diagnosis is consistent with respiratory bronchiolitis No evidence of malignancy final diagnosis is consistent with pigmented macrophages consistent with respiratory bronchiolitis All infectious disease studies have been negative  Pathology reports discussed with patient   HISTORY OF PRESENT ILLNESS: Patient underwent diagnostic bronchoscopy last week and shown to have respiratory bronchiolitis which is heavily consistent with smoking At this time is no signs of infection Does no signs of acute heart failure at this time I have explained the biopsy results to the patient at this visit Have strongly recommended him to stop smoking Patient has agreed to try Chantix again Patient also had abnormal overnight pulse oximetry and now is on 2 L nasal cannula at nighttime He should have 6 minute walk test which was within normal limits  pulmonary function testing on 05/13/17 FEV1 is 2.3 L which is 65% predicted FVC is 3.4 L which is 76% predicted Exline ratio is 68 TLC is 14 L which is 208% predicted RV is 10.8 L which is 428% predicted DLCO is abnormal for volumes adjusted Patient has moderate to severe obstructive pulmonary disease along with hyperinflation and air trapping with abnormal diffusion capacity    Patient is to undergo upper and lower endoscopy for further evaluation to rule out malignancy Patient is at moderate to high risk for post op pulmonary, occasions   REVIEW OF SYSTEMS:   Constitutional: Negative for fever, chills, + weight loss, + malaise/fatigue and + diaphoresis.    HENT: Negative for hearing loss, ear pain, nosebleeds, congestion, sore throat, neck pain, tinnitus and ear discharge.   Eyes: Negative for blurred vision, double vision, photophobia, pain, discharge and redness.  Respiratory: Negative for cough, hemoptysis, sputum production, +shortness of breath,+ wheezing and stridor.   Cardiovascular: Negative for chest pain, palpitations, orthopnea, claudication, leg swelling and PND.  Gastrointestinal: Negative for heartburn, nausea, vomiting, abdominal pain, diarrhea, constipation, blood in stool and melena.  Genitourinary: Negative for dysuria, urgency, frequency, hematuria and flank pain.  Musculoskeletal: Negative for myalgias, back pain, joint pain and falls.  ALL OTHER ROS ARE NEGATIVE    VITAL SIGNS: BP 128/70 (BP Location: Left Arm, Cuff Size: Normal)   Pulse (!) 117   Ht 5\' 11"  (1.803 m)   Wt 139 lb (63 kg)   SpO2 99%   BMI 19.39 kg/m    Physical Examination:   GENERAL:NAD, no fevers, chills, +thin +cachexia HEAD: Normocephalic, atraumatic.  EYES: Pupils equal, round, reactive to light. Extraocular muscles intact. No scleral icterus.  MOUTH: Moist mucosal membrane.   EAR, NOSE, THROAT: Clear without exudates. No external lesions.  NECK: Supple. No thyromegaly. No nodules. No JVD.  PULMONARY:CTA B/L no wheezes, no crackles, no rhonchi CARDIOVASCULAR: S1 and S2. Regular rate and rhythm. No murmurs, rubs, or gallops. No edema.  GASTROINTESTINAL: Soft, nontender, nondistended. No masses. Positive bowel sounds.  SKIN: No ulceration, lesions, rashes, or cyanosis. Skin warm and dry. Turgor intact.  PSYCHIATRIC: Mood, affect within normal limits. The patient is awake, alert and oriented x 3. Insight, judgment intact.      ASSESSMENT / PLAN: 66 y.o. 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  Patient now with chronic hypoxic respiratory failure on 2 L nasal cannula  #1 Pathological results confirm respiratory bronchiolitis with pigmented macrophages on the diagnostic bronchoscopy which was performed on 05/19/17  This time patient will need to stop smoking immediately I have discussed patient about smoking cessation and will start Chantix therapy  I will not start prednisone at this time  #2 moderate to severe COPD Gold stage C Patient advised to stop smoking Will need to address his insurance company to figure out which medicines are covered Continue Symbicort as prescribed Albuterol as needed  #3 chronic hypoxic respiratory failure Continue oxygen as prescribed  #4Follow-up with oncology   #5Patient will also need a GI referral for further workup for dysphagia and weight loss  Patient is to undergo upper and lower endoscopies in the next several days   #6 deconditioned state Recommend exercise as tolerated   Patient satisfied with Plan of action and management. All questions answered Follow-up in 3-6 months  Sorcha Rotunno Patricia Pesa, M.D.  [name]  Medical Director [place] Director [facility] Cardio-Pulmonary Department

## 2017-05-24 NOTE — Telephone Encounter (Signed)
Medical clearance was received from Dr. Grayland Ormond per [name].   [name], MD  [name], [place]        Patient is at Moderate Risk for Post op pulmonary and cardiac complications.

## 2017-05-24 NOTE — Patient Instructions (Addendum)
Please call you insurance company and obtain copy of your medication formulary   This will help your Pulmonologist to prescribe the most cost effective medications that your insurance company allows.  STOP SMOKING!!!  Start chantix

## 2017-05-25 ENCOUNTER — Ambulatory Visit: Payer: Medicare HMO | Admitting: Anesthesiology

## 2017-05-25 ENCOUNTER — Ambulatory Visit
Admission: RE | Admit: 2017-05-25 | Discharge: 2017-05-25 | Disposition: A | Discharge status: Home / Self Care | Payer: Medicare HMO | Source: Ambulatory Visit | Attending: Gastroenterology | Admitting: Gastroenterology

## 2017-05-25 ENCOUNTER — Encounter
Admission: RE | Disposition: A | Discharge status: Home / Self Care | Payer: Self-pay | Source: Ambulatory Visit | Attending: Gastroenterology

## 2017-05-25 DIAGNOSIS — Z79818 Long term (current) use of other agents affecting estrogen receptors and estrogen levels: Secondary | ICD-10-CM | POA: Insufficient documentation

## 2017-05-25 DIAGNOSIS — Z951 Presence of aortocoronary bypass graft: Secondary | ICD-10-CM | POA: Diagnosis not present

## 2017-05-25 DIAGNOSIS — K5730 Diverticulosis of large intestine without perforation or abscess without bleeding: Secondary | ICD-10-CM | POA: Diagnosis not present

## 2017-05-25 DIAGNOSIS — I5020 Unspecified systolic (congestive) heart failure: Secondary | ICD-10-CM | POA: Diagnosis not present

## 2017-05-25 DIAGNOSIS — R1310 Dysphagia, unspecified: Secondary | ICD-10-CM | POA: Diagnosis not present

## 2017-05-25 DIAGNOSIS — I110 Hypertensive heart disease with heart failure: Secondary | ICD-10-CM | POA: Insufficient documentation

## 2017-05-25 DIAGNOSIS — J449 Chronic obstructive pulmonary disease, unspecified: Secondary | ICD-10-CM | POA: Diagnosis not present

## 2017-05-25 DIAGNOSIS — I2510 Atherosclerotic heart disease of native coronary artery without angina pectoris: Secondary | ICD-10-CM | POA: Diagnosis not present

## 2017-05-25 DIAGNOSIS — E1151 Type 2 diabetes mellitus with diabetic peripheral angiopathy without gangrene: Secondary | ICD-10-CM | POA: Insufficient documentation

## 2017-05-25 DIAGNOSIS — Z7951 Long term (current) use of inhaled steroids: Secondary | ICD-10-CM | POA: Insufficient documentation

## 2017-05-25 DIAGNOSIS — Z8673 Personal history of transient ischemic attack (TIA), and cerebral infarction without residual deficits: Secondary | ICD-10-CM | POA: Insufficient documentation

## 2017-05-25 DIAGNOSIS — K6389 Other specified diseases of intestine: Secondary | ICD-10-CM | POA: Insufficient documentation

## 2017-05-25 DIAGNOSIS — I252 Old myocardial infarction: Secondary | ICD-10-CM | POA: Insufficient documentation

## 2017-05-25 DIAGNOSIS — Z681 Body mass index (BMI) 19 or less, adult: Secondary | ICD-10-CM | POA: Diagnosis not present

## 2017-05-25 DIAGNOSIS — D125 Benign neoplasm of sigmoid colon: Secondary | ICD-10-CM

## 2017-05-25 DIAGNOSIS — R634 Abnormal weight loss: Secondary | ICD-10-CM | POA: Diagnosis not present

## 2017-05-25 DIAGNOSIS — K219 Gastro-esophageal reflux disease without esophagitis: Secondary | ICD-10-CM | POA: Diagnosis not present

## 2017-05-25 DIAGNOSIS — E1143 Type 2 diabetes mellitus with diabetic autonomic (poly)neuropathy: Secondary | ICD-10-CM | POA: Insufficient documentation

## 2017-05-25 DIAGNOSIS — G4730 Sleep apnea, unspecified: Secondary | ICD-10-CM | POA: Insufficient documentation

## 2017-05-25 DIAGNOSIS — Z7984 Long term (current) use of oral hypoglycemic drugs: Secondary | ICD-10-CM | POA: Diagnosis not present

## 2017-05-25 DIAGNOSIS — Z79899 Other long term (current) drug therapy: Secondary | ICD-10-CM | POA: Insufficient documentation

## 2017-05-25 DIAGNOSIS — Z955 Presence of coronary angioplasty implant and graft: Secondary | ICD-10-CM | POA: Diagnosis not present

## 2017-05-25 DIAGNOSIS — F17210 Nicotine dependence, cigarettes, uncomplicated: Secondary | ICD-10-CM | POA: Diagnosis not present

## 2017-05-25 HISTORY — PX: ESOPHAGOGASTRODUODENOSCOPY (EGD) WITH PROPOFOL: SHX5813

## 2017-05-25 HISTORY — PX: COLONOSCOPY WITH PROPOFOL: SHX5780

## 2017-05-25 LAB — GLUCOSE, CAPILLARY: GLUCOSE-CAPILLARY: 95 mg/dL (ref 65–99)

## 2017-05-25 SURGERY — COLONOSCOPY WITH PROPOFOL
Anesthesia: General

## 2017-05-25 MED ORDER — IPRATROPIUM-ALBUTEROL 0.5-2.5 (3) MG/3ML IN SOLN
3.0000 mL | Freq: Four times a day (QID) | RESPIRATORY_TRACT | Status: DC
  Administered 2017-05-25: 3 mL via RESPIRATORY_TRACT

## 2017-05-25 MED ORDER — IPRATROPIUM-ALBUTEROL 0.5-2.5 (3) MG/3ML IN SOLN
RESPIRATORY_TRACT | Status: AC
  Filled 2017-05-25: qty 3

## 2017-05-25 MED ORDER — GLYCOPYRROLATE 0.2 MG/ML IJ SOLN
INTRAMUSCULAR | Status: DC | PRN
  Administered 2017-05-25: 0.1 mg via INTRAVENOUS

## 2017-05-25 MED ORDER — SODIUM CHLORIDE 0.9 % IV SOLN
INTRAVENOUS | Status: DC
  Administered 2017-05-25: 09:00:00 via INTRAVENOUS

## 2017-05-25 MED ORDER — LIDOCAINE HCL (CARDIAC) 20 MG/ML IV SOLN
INTRAVENOUS | Status: DC | PRN
  Administered 2017-05-25: 30 mg via INTRAVENOUS

## 2017-05-25 MED ORDER — PROPOFOL 500 MG/50ML IV EMUL
INTRAVENOUS | Status: AC
  Filled 2017-05-25: qty 50

## 2017-05-25 MED ORDER — GLYCOPYRROLATE 0.2 MG/ML IJ SOLN
INTRAMUSCULAR | Status: AC
  Filled 2017-05-25: qty 1

## 2017-05-25 MED ORDER — MIDAZOLAM HCL 2 MG/2ML IJ SOLN
INTRAMUSCULAR | Status: DC | PRN
  Administered 2017-05-25: 1 mg via INTRAVENOUS

## 2017-05-25 MED ORDER — FENTANYL CITRATE (PF) 100 MCG/2ML IJ SOLN
INTRAMUSCULAR | Status: AC
  Filled 2017-05-25: qty 2

## 2017-05-25 MED ORDER — LIDOCAINE HCL (PF) 2 % IJ SOLN
INTRAMUSCULAR | Status: AC
  Filled 2017-05-25: qty 10

## 2017-05-25 MED ORDER — FENTANYL CITRATE (PF) 100 MCG/2ML IJ SOLN
INTRAMUSCULAR | Status: DC | PRN
  Administered 2017-05-25: 50 ug via INTRAVENOUS

## 2017-05-25 MED ORDER — MIDAZOLAM HCL 2 MG/2ML IJ SOLN
INTRAMUSCULAR | Status: AC
  Filled 2017-05-25: qty 2

## 2017-05-25 MED ORDER — PROPOFOL 500 MG/50ML IV EMUL
INTRAVENOUS | Status: DC | PRN
  Administered 2017-05-25: 100 ug/kg/min via INTRAVENOUS

## 2017-05-25 NOTE — H&P (Signed)
Tony Bellows MD [address], [place], [place] [address] Phone: [phone] Fax : [phone]  Primary Care Physician:  Owens Loffler, MD Primary Gastroenterologist:  Dr. Jonathon Watson   Pre-Procedure History & Physical: HPI:  Tony Watson is a 66 y.o. male is here for an endoscopy and colonoscopy.   Past Medical History:  Diagnosis Date  . Arthritis   . Asthma   . Bronchitis   . CAD (coronary artery disease)    inferior MI 11/99 (in [place]) with PCI to [facility]. last [place] 2004 [facility]): EF 50%, patent RCA stent. no obstructive disease. last myoview 2008: EF 42%, inferior infarct, no ischemia.   . Cancer ([place])    CMML [place]  . CHF (congestive heart failure) ([place])   . COPD (chronic obstructive pulmonary disease) ([place])   . Cough    CHRONIC  . Diabetic autonomic neuropathy associated with type 2 diabetes mellitus ([place]) 03/27/2015  . Diverticulosis 2002  . DM2 (diabetes mellitus, type 2) ([facility])    A1c 6.0% 12/11  . Dyspnea   . Edema   . GERD (gastroesophageal reflux disease)   . H/O wheezing   . HTN (hypertension)   . Hyperlipidemia   . Ischemic cardiomyopathy    mild with EF 42% on myoviews 2008.  . Medical non-compliance   . Myocardial infarction ([place])   . Nicotine dependence    chronic, active  . Pneumonia    in the past  . Seasonal allergic rhinitis   . Sleep apnea   . Smoker   . Spinal stenosis   . 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 TIA (transient ischemic attack)    hx; now s/p PFO closure 2004 (in [place])  . Ulcer     Past Surgical History:  Procedure Laterality Date  . BACK SURGERY     patient denies-just lumbar punctures  . BRONCHOSCOPY    . CARDIAC CATHETERIZATION  11/99   CI per PMH  . CATARACT EXTRACTION W/PHACO Right 11/24/2016   Procedure: CATARACT EXTRACTION PHACO AND INTRAOCULAR LENS PLACEMENT (IOC);  Surgeon: Birder Robson, MD;  Location: [facility]  ORS;  Service: Ophthalmology;  Laterality: Right;  [place] 1:04.3 AP% 22.3 CDE 14.35 Fluid pack lot # [id number]  . CATARACT EXTRACTION W/PHACO Left 12/29/2016   Procedure: CATARACT EXTRACTION PHACO AND INTRAOCULAR LENS PLACEMENT (IOC);  Surgeon: Birder Robson, MD;  Location: [facility] ORS;  Service: Ophthalmology;  Laterality: Left;  [place] 00:52 AP% 18.5 CDE 9.67 Fluid pack lot # [id number]  . COLONOSCOPY    . CORONARY ANGIOPLASTY     STENT  . CORONARY ARTERY BYPASS GRAFT     stent  . EYE SURGERY    . FLEXIBLE BRONCHOSCOPY N/A 05/19/2017   Procedure: FLEXIBLE BRONCHOSCOPY;  Surgeon: Wilhelmina Mcardle, MD;  Location: [facility] ORS;  Service: Pulmonary;  Laterality: N/A;  . open heart surgery  2004   PFO repair  . UPPER GI ENDOSCOPY      Prior to Admission medications   Medication Sig Start Date End Date Taking? Authorizing Provider  acetaminophen (TYLENOL) 500 MG tablet Take 1,000 mg by mouth daily as needed for moderate pain or headache.   Yes [provider]  carvedilol (COREG) 3.125 MG tablet TAKE 1 TABLET (3.125 MG TOTAL) BY MOUTH 2 (TWO) TIMES DAILY. 03/19/17  Yes Copland, Frederico Hamman, MD  Cholecalciferol (VITAMIN D) 2000 units CAPS Take 2,000 Units by mouth every evening.  Yes [provider]  gabapentin (NEURONTIN) 300 MG capsule Take 1 capsule (300 mg total) by mouth 2 (two) times daily. Patient taking differently: Take 300 mg by mouth at bedtime.  07/22/16  Yes Copland, Frederico Hamman, MD  Glucosamine HCl 1000 MG TABS Take 1,000 mg by mouth every evening.   Yes [provider]  Glucosamine Sulfate (GLUCOSAMINE RELIEF) 1000 MG TABS Take by mouth.   Yes [provider]  ipratropium-albuterol (DUONEB) 0.5-2.5 (3) MG/3ML SOLN TAKE 3 MLS BY NEBULIZATION EVERY 4 (FOUR) HOURS AS NEEDED. 03/29/17  Yes Copland, Spencer, MD  Magnesium 250 MG TABS Take 250 mg by mouth every evening.   Yes [provider]  megestrol (MEGACE) 40 MG tablet Take 1 tablet (40 mg total) by mouth  daily. 04/26/17  Yes Lloyd Huger, MD  metFORMIN (GLUCOPHAGE-XR) 500 MG 24 hr tablet TAKE 1 TABLET (500 MG TOTAL) BY MOUTH DAILY WITH BREAKFAST. 03/19/17  Yes Copland, Frederico Hamman, MD  omeprazole (PRILOSEC) 20 MG capsule Take 20 mg by mouth daily.   Yes [provider]  oxymetazoline (AFRIN) 0.05 % nasal spray Place 1 spray into both nostrils daily as needed for congestion.   Yes [provider]  pseudoephedrine (SUDAFED) 30 MG tablet Take 30 mg by mouth daily as needed for congestion.    Yes [provider]  SYMBICORT 160-4.5 MCG/ACT inhaler INHALE 2 PUFFS INTO THE LUNGS 2 (TWO) TIMES DAILY. 09/21/16  Yes Copland, Frederico Hamman, MD  tiZANidine (ZANAFLEX) 4 MG tablet TAKE 1 TABLET (4 MG TOTAL) BY MOUTH NIGHTLY. 03/21/17   Copland, Frederico Hamman, MD  varenicline (CHANTIX STARTING MONTH PAK) 0.5 MG X 11 & 1 MG X 42 tablet Take one 0.5 mg tablet by mouth once daily for 3 days, then increase to one 0.5 mg tablet twice daily for 4 days, then increase to one 1 mg tablet twice daily. 05/24/17   [name], MD    Allergies as of 05/19/2017  . (No Known Allergies)    Family History  Problem Relation Age of Onset  . Coronary artery disease Unknown        family hx  . Breast cancer Unknown        1st egree relative <50    Social History   Social History  . Marital status: Married    Spouse name: N/A  . Number of children: N/A  . Years of education: N/A   Occupational History  . Not on file.   Social History Main Topics  . Smoking status: Current Every Day Smoker    Packs/day: 0.50    Years: 45.00    Types: Cigarettes  . Smokeless tobacco: Never Used     Comment: 1 ppd +40 years  . Alcohol use 0.0 oz/week     Comment: weekly but last dose 1 month  . Drug use: No  . Sexual activity: Not on file   Other Topics Concern  . Not on file   Social History Narrative   Married, 1 daughter. 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 Lives in [place], [place]     Review of Systems: See HPI, otherwise negative ROS  Physical Exam: BP 118/66   Pulse (!) 122   Temp (!) 97.4 F (36.3 C) (Tympanic)   Resp (!) 24   Ht _0  (1.803 m)   Wt 139 lb (63 kg)   SpO2 98%   BMI 19.39 kg/m  General:   Alert,  pleasant and cooperative in NAD Head:  Normocephalic and atraumatic. Neck:  Supple; no masses or thyromegaly. Lungs:  Clear throughout to auscultation.    Heart:  Regular rate and rhythm. Abdomen:  Soft, nontender and nondistended. Normal bowel sounds, without guarding, and without rebound.   Neurologic:  Alert and  oriented x4;  grossly normal neurologically.  Impression/Plan: [name] is here for an endoscopy and colonoscopy to be performed for weight loss and dysphagia   Risks, benefits, limitations, and alternatives regarding  endoscopy and colonoscopy have been reviewed with the patient.  Questions have been answered.  All parties agreeable.   Tony Bellows, MD  05/25/2017, 9:07 AM

## 2017-05-25 NOTE — Op Note (Addendum)
[facility] Gastroenterology Patient Name: Tony Watson Procedure Date: 05/25/2017 9:00 AM MRN: [id number] Account #: [id number] Date of Birth: 29-Dec-1950 Admit Type: Outpatient Age: 66 Room: [facility] ENDO ROOM 1 Gender: Male Note Status: Finalized Procedure:            Upper GI endoscopy Indications:          Dysphagia Providers:            Jonathon Bellows MD, MD Medicines:            Monitored Anesthesia Care Complications:        No immediate complications. Procedure:            Pre-Anesthesia Assessment:                       - Prior to the procedure, a History and Physical was                        performed, and patient medications, allergies and                        sensitivities were reviewed. The patient's tolerance of                        previous anesthesia was reviewed.                       - The risks and benefits of the procedure and the                        sedation options and risks were discussed with the                        patient. All questions were answered and informed                        consent was obtained.                       - ASA Grade Assessment: III - A patient with severe                        systemic disease.                       After obtaining informed consent, the endoscope was                        passed under direct vision. Throughout the procedure,                        the patient's blood pressure, pulse, and oxygen                        saturations were monitored continuously. The Endoscope                        was introduced through the mouth, and advanced to the                        third part of duodenum. The upper GI endoscopy was  accomplished with ease. The patient tolerated the                        procedure well. Findings:      The examined duodenum was normal.      The stomach was normal.      The examined esophagus was normal. Biopsies were taken with a cold   forceps for histology.      The exam was otherwise without abnormality. Impression:           - Normal examined duodenum.                       - Normal stomach.                       - Normal esophagus. Biopsied.                       - The examination was otherwise normal. Recommendation:       - Await pathology results.                       - Perform a colonoscopy today. Procedure Code(s):    --- Professional ---                       [id number], Esophagogastroduodenoscopy, flexible, transoral;                        with biopsy, single or multiple Diagnosis Code(s):    --- Professional ---                       R13.10, Dysphagia, unspecified CPT copyright 2016 American Medical Association. All rights reserved. The codes documented in this report are preliminary and upon coder review may  be revised to meet current compliance requirements. Jonathon Bellows, MD Jonathon Bellows MD, MD 05/25/2017 9:25:47 AM This report has been signed electronically. Number of Addenda: 0 Note Initiated On: 05/25/2017 9:00 AM      [facility]

## 2017-05-25 NOTE — Anesthesia Postprocedure Evaluation (Signed)
Anesthesia Post Note  Patient: Tony Watson  Procedure(s) Performed: COLONOSCOPY WITH PROPOFOL (N/A ) ESOPHAGOGASTRODUODENOSCOPY (EGD) WITH PROPOFOL (N/A )  Patient location during evaluation: PACU Anesthesia Type: General Level of consciousness: awake and alert Pain management: pain level controlled Vital Signs Assessment: post-procedure vital signs reviewed and stable Respiratory status: spontaneous breathing, nonlabored ventilation, respiratory function stable and patient connected to nasal cannula oxygen Cardiovascular status: blood pressure returned to baseline and stable Postop Assessment: no apparent nausea or vomiting Anesthetic complications: no     Last Vitals:  Vitals:   05/25/17 1010 05/25/17 1020  BP: 114/62 118/68  Pulse: 92 90  Resp: 17 (!) 21  Temp:    SpO2: 99% 100%    Last Pain:  Vitals:   05/25/17 0850  TempSrc: Tympanic                 [name]

## 2017-05-25 NOTE — Op Note (Addendum)
[facility] Gastroenterology Patient Name: Tony Watson Procedure Date: 05/25/2017 9:00 AM MRN: [id number] Account #: [id number] Date of Birth: [birth year] Admit Type: Outpatient Age: 66 Room: [facility] ENDO ROOM 1 Gender: Male Note Status: Finalized Procedure:            Colonoscopy Indications:          Weight loss Providers:            Jonathon Bellows MD, MD Medicines:            Monitored Anesthesia Care Complications:        No immediate complications. Procedure:            Pre-Anesthesia Assessment:                       - Prior to the procedure, a History and Physical was                        performed, and patient medications, allergies and                        sensitivities were reviewed. The patient's tolerance of                        previous anesthesia was reviewed.                       - The risks and benefits of the procedure and the                        sedation options and risks were discussed with the                        patient. All questions were answered and informed                        consent was obtained.                       - ASA Grade Assessment: III - A patient with severe                        systemic disease.                       After obtaining informed consent, the colonoscope was                        passed under direct vision. Throughout the procedure,                        the patient's blood pressure, pulse, and oxygen                        saturations were monitored continuously. The                        Colonoscope was introduced through the anus and                        advanced to the the cecum, identified by the  appendiceal orifice, IC valve and transillumination.                        The colonoscopy was performed with ease. The patient                        tolerated the procedure well. The quality of the bowel                        preparation was good. Findings:      The  perianal and digital rectal examinations were normal.      A 3 mm polyp was found in the sigmoid colon. The polyp was sessile. The       polyp was removed with a cold biopsy forceps. Resection and retrieval       were complete.      Multiple small-mouthed diverticula were found in the left colon.      The exam was otherwise without abnormality on direct and retroflexion       views. Impression:           - One 3 mm polyp in the sigmoid colon, removed with a                        cold biopsy forceps. Resected and retrieved.                       - Diverticulosis in the left colon.                       - The examination was otherwise normal on direct and                        retroflexion views. Recommendation:       - Discharge patient to home (with escort).                       - Resume previous diet.                       - Continue present medications.                       - Await pathology results.                       - Return to my office PRN. Procedure Code(s):    --- Professional ---                       [phone], Colonoscopy, flexible; with biopsy, single or                        multiple Diagnosis Code(s):    --- Professional ---                       D12.5, Benign neoplasm of sigmoid colon                       R63.4, Abnormal weight loss                       K57.30, Diverticulosis of large intestine without  perforation or abscess without bleeding CPT copyright 2016 American Medical Association. All rights reserved. The codes documented in this report are preliminary and upon coder review may  be revised to meet current compliance requirements. Jonathon Bellows, MD Jonathon Bellows MD, MD 05/25/2017 9:46:12 AM This report has been signed electronically. Number of Addenda: 0 Note Initiated On: 05/25/2017 9:00 AM Scope Withdrawal Time: 0 hours 9 minutes 59 seconds  Total Procedure Duration: 0 hours 15 minutes 33 seconds       [facility]

## 2017-05-25 NOTE — Anesthesia Preprocedure Evaluation (Signed)
Anesthesia Evaluation  Patient identified by MRN, date of birth, ID band Patient awake    Reviewed: Allergy & Precautions, H&P , NPO status , Patient's Chart, lab work & pertinent test results  History of Anesthesia Complications Negative for: history of anesthetic complications  Airway Mallampati: III  TM Distance: <3 FB Neck ROM: limited    Dental  (+) Poor Dentition, Missing, Edentulous Upper, Edentulous Lower   Pulmonary shortness of breath and with exertion, asthma , sleep apnea , pneumonia, COPD, Current Smoker,           Cardiovascular Exercise Tolerance: Good hypertension, (-) angina+ CAD, + Past MI, + Peripheral Vascular Disease and +CHF       Neuro/Psych TIACVA negative psych ROS   GI/Hepatic Neg liver ROS, GERD  Medicated and Controlled,  Endo/Other  diabetes, Type 2  Renal/GU negative Renal ROS  negative genitourinary   Musculoskeletal  (+) Arthritis ,   Abdominal   Peds  Hematology negative hematology ROS (+)   Anesthesia Other Findings Past Medical History: No date: Arthritis No date: Asthma No date: Bronchitis No date: CAD (coronary artery disease)     Comment:  inferior MI 11/99 (in [place]) with PCI to [facility]. last [place]               2004 [facility]): EF 50%, patent RCA stent. no obstructive               disease. last myoview 2008: EF 42%, inferior infarct, no               ischemia.  No date: Cancer [facility])     Comment:  CMML Lukemia No date: CHF (congestive heart failure) (HCC) No date: COPD (chronic obstructive pulmonary disease) (HCC) No date: Cough     Comment:  CHRONIC 03/27/2015: Diabetic autonomic neuropathy associated with type 2  diabetes mellitus ([place]) 2002: Diverticulosis No date: DM2 (diabetes mellitus, type 2) (HCC)     Comment:  A1c 6.0% 12/11 No date: Dyspnea No date: Edema No date: GERD (gastroesophageal reflux disease) No date: H/O wheezing No date: HTN (hypertension) No date:  Hyperlipidemia No date: Ischemic cardiomyopathy     Comment:  mild with EF 42% on myoviews 2008. 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 ([place]) No date: TIA (transient ischemic attack)     Comment:  hx; now s/p PFO closure 2004 (in [place]) No date: Ulcer  Past Surgical History: No date: BACK SURGERY     Comment:  patient denies-just lumbar punctures No date: BRONCHOSCOPY 11/99: CARDIAC CATHETERIZATION     Comment:  CI per [facility] 11/24/2016: CATARACT EXTRACTION W/PHACO; Right     Comment:  Procedure: CATARACT EXTRACTION PHACO AND INTRAOCULAR               LENS PLACEMENT ([place]);  Surgeon: Birder Robson, MD;                Location: [facility] ORS;  Service: Ophthalmology;  Laterality:              Right;  [place] 1:04.3 AP% 22.3 CDE 14.35 Fluid pack lot #  [id number] 12/29/2016: CATARACT EXTRACTION W/PHACO; Left     Comment:  Procedure: CATARACT EXTRACTION PHACO AND INTRAOCULAR               LENS PLACEMENT (IOC);  Surgeon: Birder Robson, MD;                Location: [facility] ORS;  Service: Ophthalmology;  Laterality:              Left;  [place] 00:52 AP% 18.5 CDE 9.67 Fluid pack lot #               [id number] No date: COLONOSCOPY No date: CORONARY ANGIOPLASTY     Comment:  STENT No date: CORONARY ARTERY BYPASS GRAFT     Comment:  stent No date: EYE SURGERY 05/19/2017: FLEXIBLE BRONCHOSCOPY; N/A     Comment:  Procedure: FLEXIBLE BRONCHOSCOPY;  Surgeon: Wilhelmina Mcardle, MD;  Location: [facility] ORS;  Service: Pulmonary;                Laterality: N/A; 2004: open heart surgery     Comment:  PFO repair No  date: UPPER GI ENDOSCOPY  BMI    Body Mass Index:  19.39 kg/m      Reproductive/Obstetrics negative OB ROS                             Anesthesia Physical Anesthesia Plan  ASA: III  Anesthesia Plan: General   Post-op Pain Management:    Induction: Intravenous  PONV Risk Score and Plan: Propofol infusion  Airway Management Planned: Natural Airway and Nasal Cannula  Additional Equipment:   Intra-op Plan:   Post-operative Plan:   Informed Consent: I have reviewed the patients History and Physical, chart, labs and discussed the procedure including the risks, benefits and alternatives for the proposed anesthesia with the patient or authorized representative who has indicated his/her understanding and acceptance.   Dental Advisory Given  Plan Discussed with: Anesthesiologist, CRNA and Surgeon  Anesthesia Plan Comments: (Patient consented for risks of anesthesia including but not limited to:  - adverse reactions to medications - risk of intubation if required - damage to teeth, lips or other oral mucosa - sore throat or hoarseness - Damage to heart, brain, lungs or loss of life  Patient voiced understanding.)        Anesthesia Quick Evaluation

## 2017-05-25 NOTE — Anesthesia Post-op Follow-up Note (Signed)
Anesthesia QCDR form completed.        

## 2017-05-25 NOTE — Transfer of Care (Signed)
Immediate Anesthesia Transfer of Care Note  Patient: Tony Watson  Procedure(s) Performed: COLONOSCOPY WITH PROPOFOL (N/A ) ESOPHAGOGASTRODUODENOSCOPY (EGD) WITH PROPOFOL (N/A )  Patient Location: PACU  Anesthesia Type:General  Level of Consciousness: awake and sedated  Airway & Oxygen Therapy: Patient Spontanous Breathing and Patient connected to nasal cannula oxygen  Post-op Assessment: Report given to RN and Post -op Vital signs reviewed and stable  Post vital signs: Reviewed and stable  Last Vitals:  Vitals:   05/25/17 0850  BP: 118/66  Pulse: (!) 122  Resp: (!) 24  Temp: (!) 36.3 C  SpO2: 98%    Last Pain:  Vitals:   05/25/17 0850  TempSrc: Tympanic         Complications: No apparent anesthesia complications

## 2017-05-25 NOTE — Anesthesia Procedure Notes (Signed)
Performed by: COOK-MARTIN, Siedah Sedor Pre-anesthesia Checklist: Patient identified, Emergency Drugs available, Suction available, Patient being monitored and Timeout performed Patient Re-evaluated:Patient Re-evaluated prior to induction Oxygen Delivery Method: Nasal cannula Preoxygenation: Pre-oxygenation with 100% oxygen Induction Type: IV induction Airway Equipment and Method: Bite block Placement Confirmation: CO2 detector and positive ETCO2       

## 2017-05-26 ENCOUNTER — Inpatient Hospital Stay: Payer: Medicare HMO | Admitting: Oncology

## 2017-05-26 ENCOUNTER — Encounter: Payer: Self-pay | Admitting: Gastroenterology

## 2017-05-27 LAB — SURGICAL PATHOLOGY

## 2017-06-03 ENCOUNTER — Telehealth: Payer: Self-pay

## 2017-06-03 NOTE — Telephone Encounter (Signed)
LVm for patient callback for results per Dr. Vicente Males.   - TSH normal - no EOE - diverticulosis

## 2017-06-04 ENCOUNTER — Telehealth: Payer: Self-pay | Admitting: Oncology

## 2017-06-04 NOTE — Telephone Encounter (Signed)
Left VM for pt to call back to schedule appt to follow-up with [place] due to contact from GI provider.

## 2017-06-09 NOTE — Progress Notes (Signed)
[place]  [phone] [phone]  ID: MISAEL MCGAHA OB: Dec 03, 1950  MR#: [id number]  NTI#:[id number]  Patient Care Team: Owens Loffler, MD as PCP - General Ola Spurr Cheral Marker, MD (Infectious Diseases) [name], MD as Consulting Physician (Pulmonary Disease)  CHIEF COMPLAINT: Bone marrow biopsy proven CMML-1.  INTERVAL HISTORY: Patient returns to clinic today for continued follow-up of his weight loss. His appetite has improved and his weight has remained stable over the last month.  He has been evaluated by pulmonology, infectious disease, and GI with no obvious etiology. He continues to have persistent weakness and fatigue. He denies any fevers or night sweats. He has no neurologic complaints. He has no chest pain or shortness of breath. He denies any nausea, vomiting, constipation, or diarrhea. He has no urinary complaints. Patient offers no further specific complaints today.  REVIEW OF SYSTEMS:   Review of Systems  Constitutional: Positive for malaise/fatigue and weight loss. Negative for diaphoresis and fever.  HENT: Negative.  Negative for sore throat.   Respiratory: Negative for cough and shortness of breath.   Cardiovascular: Negative.  Negative for chest pain and leg swelling.  Gastrointestinal: Negative.  Negative for abdominal pain, nausea and vomiting.  Genitourinary: Negative.   Musculoskeletal: Negative.   Neurological: Positive for weakness.  Endo/Heme/Allergies: Does not bruise/bleed easily.  Psychiatric/Behavioral: Negative.  The patient is not nervous/anxious.     As per HPI. Otherwise, a complete review of systems is negative.  PAST MEDICAL HISTORY: Past Medical History:  Diagnosis Date  . Arthritis   . Asthma   . Bronchitis   . CAD (coronary artery disease)    inferior MI 11/99 (in [place]) with PCI to [facility]. last [place] 2004 [facility]): EF 50%, patent RCA stent. no obstructive disease. last myoview 2008: EF 42%,  inferior infarct, no ischemia.   . Cancer ([place])    CMML Lukemia  . CHF (congestive heart failure) ([place])   . COPD (chronic obstructive pulmonary disease) ([place])   . Cough    CHRONIC  . Diabetic autonomic neuropathy associated with type 2 diabetes mellitus ([place]) 03/27/2015  . Diverticulosis 2002  . DM2 (diabetes mellitus, type 2) (HCC)    A1c 6.0% 12/11  . Dyspnea   . Edema   . GERD (gastroesophageal reflux disease)   . H/O wheezing   . HTN (hypertension)   . Hyperlipidemia   . Ischemic cardiomyopathy    mild with EF 42% on myoviews 2008.  . Medical non-compliance   . Myocardial infarction ([place])   . Nicotine dependence    chronic, active  . Pneumonia    in the past  . Seasonal allergic rhinitis   . Sleep apnea   . Smoker   . Spinal stenosis   . Stroke ([place])    RECURRENT AFFECTING MEMORY  . 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 TIA (transient ischemic attack)    hx; now s/p PFO closure 2004 (in [place])  . Ulcer     PAST SURGICAL HISTORY: Past Surgical History:  Procedure Laterality Date  . BACK SURGERY     patient denies-just lumbar punctures  . BRONCHOSCOPY    . CARDIAC CATHETERIZATION  11/99   CI per [facility]  . COLONOSCOPY    . CORONARY ANGIOPLASTY     STENT  . CORONARY ARTERY BYPASS GRAFT     stent  . EYE SURGERY    . open heart surgery  2004   PFO repair  .  UPPER GI ENDOSCOPY      FAMILY HISTORY Family History  Problem Relation Age of Onset  . Coronary artery disease Unknown        family hx  . Breast cancer Unknown        1st egree relative <50       ADVANCED DIRECTIVES:    HEALTH MAINTENANCE: Social History   Tobacco Use  . Smoking status: Current Every Day Smoker    Packs/day: 0.50    Years: 45.00    Pack years: 22.50    Types: Cigarettes  . Smokeless tobacco: Never Used  . Tobacco comment: 1 ppd +40 years  Substance Use Topics  . Alcohol use: Yes    Alcohol/week: 0.0 oz    Comment: weekly but  last dose 1 month  . Drug use: No     Colonoscopy:  PAP:  Bone density:  Lipid panel:  No Known Allergies  Current Outpatient Medications  Medication Sig Dispense Refill  . acetaminophen (TYLENOL) 500 MG tablet Take 1,000 mg by mouth daily as needed for moderate pain or headache.    . carvedilol (COREG) 3.125 MG tablet TAKE 1 TABLET (3.125 MG TOTAL) BY MOUTH 2 (TWO) TIMES DAILY. 180 tablet 0  . Cholecalciferol (VITAMIN D) 2000 units CAPS Take 2,000 Units by mouth every evening.    . gabapentin (NEURONTIN) 300 MG capsule Take 1 capsule (300 mg total) by mouth 2 (two) times daily. (Patient taking differently: Take 300 mg by mouth at bedtime. ) 180 capsule 3  . Glucosamine HCl 1000 MG TABS Take 1,000 mg by mouth every evening.    . Glucosamine Sulfate (GLUCOSAMINE RELIEF) 1000 MG TABS Take by mouth.    Marland Kitchen ipratropium-albuterol (DUONEB) 0.5-2.5 (3) MG/3ML SOLN TAKE 3 MLS BY NEBULIZATION EVERY 4 (FOUR) HOURS AS NEEDED. 630 mL 5  . Magnesium 250 MG TABS Take 250 mg by mouth every evening.    . megestrol (MEGACE) 40 MG tablet Take 1 tablet (40 mg total) by mouth daily. 30 tablet 2  . metFORMIN (GLUCOPHAGE-XR) 500 MG 24 hr tablet TAKE 1 TABLET (500 MG TOTAL) BY MOUTH DAILY WITH BREAKFAST. 90 tablet 0  . omeprazole (PRILOSEC) 20 MG capsule Take 20 mg by mouth daily.    Marland Kitchen oxymetazoline (AFRIN) 0.05 % nasal spray Place 1 spray into both nostrils daily as needed for congestion.    . pseudoephedrine (SUDAFED) 30 MG tablet Take 30 mg by mouth daily as needed for congestion.     . SYMBICORT 160-4.5 MCG/ACT inhaler INHALE 2 PUFFS INTO THE LUNGS 2 (TWO) TIMES DAILY. 10.2 Inhaler 4  . tiZANidine (ZANAFLEX) 4 MG tablet TAKE 1 TABLET (4 MG TOTAL) BY MOUTH NIGHTLY. 90 tablet 3  . varenicline (CHANTIX STARTING MONTH PAK) 0.5 MG X 11 & 1 MG X 42 tablet Take one 0.5 mg tablet by mouth once daily for 3 days, then increase to one 0.5 mg tablet twice daily for 4 days, then increase to one 1 mg tablet twice daily.  (Patient not taking: Reported on 06/14/2017) 53 tablet 0   No current facility-administered medications for this visit.     OBJECTIVE: Vitals:   06/14/17 1111  BP: 124/76  Pulse: (!) 118  Resp: 18  Temp: 98.8 F (37.1 C)     Body mass index is 19.48 kg/m.    ECOG FS:0 - Asymptomatic  General: Well-developed, well-nourished, no acute distress. Eyes: Pink conjunctiva, anicteric sclera. HEENT: Scant lymphadenopathy on left neck, erythema noted in oropharynx. Lungs:  Clear to auscultation bilaterally. Heart: Regular rate and rhythm. No rubs, murmurs, or gallops. Abdomen: Soft, nontender, nondistended. No organomegaly noted, normoactive bowel sounds. Musculoskeletal: No edema, cyanosis, or clubbing. Neuro: Alert, answering all questions appropriately. Cranial nerves grossly intact. Skin: No rashes or petechiae noted. Psych: Normal affect.   LAB RESULTS:  Lab Results  Component Value Date   NA 132 (L) 06/14/2017   K 4.0 06/14/2017   CL 96 (L) 06/14/2017   CO2 26 06/14/2017   GLUCOSE 103 (H) 06/14/2017   BUN 13 06/14/2017   CREATININE 0.65 06/14/2017   CALCIUM 9.1 06/14/2017   PROT 9.4 (H) 06/14/2017   ALBUMIN 3.1 (L) 06/14/2017   AST 29 06/14/2017   ALT 25 06/14/2017   ALKPHOS 83 06/14/2017   BILITOT 0.5 06/14/2017   GFRNONAA >60 06/14/2017   GFRAA >60 06/14/2017    Lab Results  Component Value Date   WBC 7.7 06/14/2017   NEUTROABS 3.4 06/14/2017   HGB 9.9 (L) 06/14/2017   HCT 29.2 (L) 06/14/2017   MCV 94.2 06/14/2017   PLT 286 06/14/2017     STUDIES: Dg C-arm 1-60 Min-no Report  Result Date: 05/19/2017 Fluoroscopy was utilized by the requesting physician.  No radiographic interpretation.    ASSESSMENT: Bone marrow biopsy proven CMML-1.  PLAN:    1. CMML: Bone marrow biopsy completed on August 30, 2015 confirmed the diagnosis. Repeat bone marrow biopsy on March 08, 2017 is essentially unchanged. Patient's absolute monocytosis has been greater than 1.0  and persistent since at least July 2014. The remainder of his blood work including BCR-ABL and ANA are negative or within normal limits. Previously, hepatitis C and HIV were also negative.  He will likely need treatment with azacitidine or decitabine at some point, but not at this time. He has no evidence of end organ involvement. CT scans from February 24, 2017 revealed stable lung nodules. Return to clinic in 6 weeks for further evaluation. 2. Thrombocytopenia: Resolved. 3. Neutropenia: Resolved. 4. Pulmonary nodule: Slightly larger but stable from recent CT scan compared to the scan in 04/2016. 5. Weight loss: CT scans and bone marrow biopsy as above. Thyroid panel within normal limits. Encouraged supplements such as Ensure and Boost. Continue Megace 40 mg daily.  Patient has been evaluated by pulmonology with bronchoscopy, GI with endoscopies, and infectious disease without any obvious etiology for his weight loss.  Patient has now been given a referral to dietary.  Return to clinic in 6 weeks as above.  Approximately 30 minutes was spent in discussion of which greater than 50% was consultation.  Patient expressed understanding and was in agreement with this plan. He also understands that He can call clinic at any time with any questions, concerns, or complaints.    Lloyd Huger, MD   06/14/2017 1:04 PM

## 2017-06-11 ENCOUNTER — Other Ambulatory Visit: Payer: Self-pay | Admitting: Oncology

## 2017-06-11 DIAGNOSIS — C9310 Chronic myelomonocytic leukemia not having achieved remission: Secondary | ICD-10-CM

## 2017-06-14 ENCOUNTER — Inpatient Hospital Stay: Payer: Medicare HMO | Attending: Oncology | Admitting: Oncology

## 2017-06-14 ENCOUNTER — Inpatient Hospital Stay: Payer: Medicare HMO

## 2017-06-14 VITALS — BP 124/76 | HR 118 | Temp 98.8°F | Resp 18 | Wt 139.7 lb

## 2017-06-14 DIAGNOSIS — E119 Type 2 diabetes mellitus without complications: Secondary | ICD-10-CM | POA: Diagnosis not present

## 2017-06-14 DIAGNOSIS — K219 Gastro-esophageal reflux disease without esophagitis: Secondary | ICD-10-CM | POA: Diagnosis not present

## 2017-06-14 DIAGNOSIS — F17210 Nicotine dependence, cigarettes, uncomplicated: Secondary | ICD-10-CM

## 2017-06-14 DIAGNOSIS — R5383 Other fatigue: Secondary | ICD-10-CM | POA: Insufficient documentation

## 2017-06-14 DIAGNOSIS — R5381 Other malaise: Secondary | ICD-10-CM | POA: Insufficient documentation

## 2017-06-14 DIAGNOSIS — R634 Abnormal weight loss: Secondary | ICD-10-CM | POA: Diagnosis not present

## 2017-06-14 DIAGNOSIS — I252 Old myocardial infarction: Secondary | ICD-10-CM | POA: Diagnosis not present

## 2017-06-14 DIAGNOSIS — I110 Hypertensive heart disease with heart failure: Secondary | ICD-10-CM

## 2017-06-14 DIAGNOSIS — I2510 Atherosclerotic heart disease of native coronary artery without angina pectoris: Secondary | ICD-10-CM | POA: Insufficient documentation

## 2017-06-14 DIAGNOSIS — I5020 Unspecified systolic (congestive) heart failure: Secondary | ICD-10-CM | POA: Diagnosis not present

## 2017-06-14 DIAGNOSIS — Z79899 Other long term (current) drug therapy: Secondary | ICD-10-CM | POA: Insufficient documentation

## 2017-06-14 DIAGNOSIS — R918 Other nonspecific abnormal finding of lung field: Secondary | ICD-10-CM | POA: Insufficient documentation

## 2017-06-14 DIAGNOSIS — Z7984 Long term (current) use of oral hypoglycemic drugs: Secondary | ICD-10-CM | POA: Diagnosis not present

## 2017-06-14 DIAGNOSIS — J449 Chronic obstructive pulmonary disease, unspecified: Secondary | ICD-10-CM | POA: Insufficient documentation

## 2017-06-14 DIAGNOSIS — C9310 Chronic myelomonocytic leukemia not having achieved remission: Secondary | ICD-10-CM

## 2017-06-14 DIAGNOSIS — Z8673 Personal history of transient ischemic attack (TIA), and cerebral infarction without residual deficits: Secondary | ICD-10-CM | POA: Insufficient documentation

## 2017-06-14 DIAGNOSIS — R531 Weakness: Secondary | ICD-10-CM | POA: Diagnosis not present

## 2017-06-14 DIAGNOSIS — I255 Ischemic cardiomyopathy: Secondary | ICD-10-CM

## 2017-06-14 DIAGNOSIS — E785 Hyperlipidemia, unspecified: Secondary | ICD-10-CM | POA: Diagnosis not present

## 2017-06-14 LAB — COMPREHENSIVE METABOLIC PANEL
ALK PHOS: 83 U/L (ref 38–126)
ALT: 25 U/L (ref 17–63)
AST: 29 U/L (ref 15–41)
Albumin: 3.1 g/dL — ABNORMAL LOW (ref 3.5–5.0)
Anion gap: 10 (ref 5–15)
BILIRUBIN TOTAL: 0.5 mg/dL (ref 0.3–1.2)
BUN: 13 mg/dL (ref 6–20)
CALCIUM: 9.1 mg/dL (ref 8.9–10.3)
CHLORIDE: 96 mmol/L — AB (ref 101–111)
CO2: 26 mmol/L (ref 22–32)
CREATININE: 0.65 mg/dL (ref 0.61–1.24)
GFR calc Af Amer: >60 mL/min (ref >60)
Glucose, Bld: 103 mg/dL — ABNORMAL HIGH (ref 65–99)
Potassium: 4 mmol/L (ref 3.5–5.1)
Sodium: 132 mmol/L — ABNORMAL LOW (ref 135–145)
TOTAL PROTEIN: 9.4 g/dL — AB (ref 6.5–8.1)

## 2017-06-14 LAB — CBC WITH DIFFERENTIAL/PLATELET
Basophils Absolute: 0.1 10*3/uL (ref 0–0.1)
Basophils Relative: 1 %
EOS ABS: 0 10*3/uL (ref 0–0.7)
Eosinophils Relative: 0 %
HCT: 29.2 % — ABNORMAL LOW (ref 40.0–52.0)
Hemoglobin: 9.9 g/dL — ABNORMAL LOW (ref 13.0–18.0)
LYMPHS ABS: 1.1 10*3/uL (ref 1.0–3.6)
Lymphocytes Relative: 14 %
MCH: 32 pg (ref 26.0–34.0)
MCHC: 34 g/dL (ref 32.0–36.0)
MCV: 94.2 fL (ref 80.0–100.0)
MONO ABS: 3.1 10*3/uL — AB (ref 0.2–1.0)
Monocytes Relative: 40 %
Neutro Abs: 3.4 10*3/uL (ref 1.4–6.5)
Neutrophils Relative %: 45 %
PLATELETS: 286 10*3/uL (ref 150–400)
RBC: 3.1 MIL/uL — AB (ref 4.40–5.90)
RDW: 15.3 % — AB (ref 11.5–14.5)
WBC: 7.7 10*3/uL (ref 3.8–10.6)

## 2017-06-14 NOTE — Progress Notes (Signed)
Patient is here today for follow up, he mentions no appetite, and nausea. He has 1-2 bowel movements weekly. He also mentions fatigue.

## 2017-06-17 ENCOUNTER — Telehealth: Payer: Self-pay

## 2017-06-17 ENCOUNTER — Telehealth: Payer: Self-pay | Admitting: Gastroenterology

## 2017-06-17 LAB — COMP PANEL: LEUKEMIA/LYMPHOMA

## 2017-06-17 NOTE — Telephone Encounter (Signed)
LVM for patient callback for results per Dr. Vicente Males.   Celiac serology negative, IGA is elevated- can we recheck IGA levels if elevated will need referal to hematology.  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 The protein is produced in a type of white blood cell (plasma cells) in your bone marrow. MGUS usually causes no problems.

## 2017-06-17 NOTE — Telephone Encounter (Signed)
Patient left a voice message returning your call for results.

## 2017-06-17 NOTE — Telephone Encounter (Signed)
-----   Message from Jonathon Bellows, MD sent at 06/16/2017 12:32 PM EST ----- Celiac serology negative, IGA is elevated- can we recheck IGA levels if elevated will need referal to hematology

## 2017-06-21 LAB — FUNGUS CULTURE RESULT

## 2017-06-21 LAB — FUNGAL ORGANISM REFLEX

## 2017-06-21 LAB — FUNGUS CULTURE WITH STAIN

## 2017-07-02 LAB — ACID FAST CULTURE WITH REFLEXED SENSITIVITIES (MYCOBACTERIA): Acid Fast Culture: NEGATIVE

## 2017-07-13 ENCOUNTER — Ambulatory Visit: Payer: Medicare HMO | Admitting: Oncology

## 2017-07-24 NOTE — Progress Notes (Signed)
[place]  [phone] Fax:(336) [phone]  ID: Tony Watson OB: 05-22-1951  MR#: [id number]  ZYS#:[id number]  Patient Care Team: Owens Loffler, MD as PCP - General Ola Spurr Cheral Marker, MD (Infectious Diseases) [name], MD as Consulting Physician (Pulmonary Disease)  CHIEF COMPLAINT: Bone marrow biopsy proven CMML-1.  INTERVAL HISTORY: Patient returns to clinic today for continued follow-up of his weight loss. His appetite has improved and his weight has remained stable over the last month.  He has been evaluated by pulmonology, infectious disease, and GI with no obvious etiology. He continues to have persistent weakness and fatigue. He denies any fevers or night sweats. He has no neurologic complaints. He has no chest pain or shortness of breath. He denies any nausea, vomiting, constipation, or diarrhea. He has no urinary complaints. Patient offers no further specific complaints today.  REVIEW OF SYSTEMS:   Review of Systems  Constitutional: Positive for malaise/fatigue and weight loss. Negative for diaphoresis and fever.  HENT: Negative.  Negative for sore throat.   Respiratory: Negative for cough and shortness of breath.   Cardiovascular: Negative.  Negative for chest pain and leg swelling.  Gastrointestinal: Negative.  Negative for abdominal pain, nausea and vomiting.  Genitourinary: Negative.   Musculoskeletal: Negative.   Neurological: Positive for weakness.  Endo/Heme/Allergies: Does not bruise/bleed easily.  Psychiatric/Behavioral: Negative.  The patient is not nervous/anxious.     As per HPI. Otherwise, a complete review of systems is negative.  PAST MEDICAL HISTORY: Past Medical History:  Diagnosis Date  . Arthritis   . Asthma   . Bronchitis   . CAD (coronary artery disease)    inferior MI 11/99 (in [place]) with PCI to [facility]. last [place] 2004 [facility]): EF 50%, patent RCA stent. no obstructive disease. last myoview 2008: EF 42%,  inferior infarct, no ischemia.   . 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 CHF (congestive heart failure) ([place])   . COPD (chronic obstructive pulmonary disease) ([place])   . Cough    CHRONIC  . Diabetic autonomic neuropathy associated with type 2 diabetes mellitus ([place]) 03/27/2015  . Diverticulosis 2002  . DM2 (diabetes mellitus, type 2) ([facility])    A1c 6.0% 12/11  . Dyspnea   . Edema   . GERD (gastroesophageal reflux disease)   . H/O wheezing   . HTN (hypertension)   . Hyperlipidemia   . Ischemic cardiomyopathy    mild with EF 42% on myoviews 2008.  . Medical non-compliance   . Myocardial infarction ([place])   . Nicotine dependence    chronic, active  . Pneumonia    in the past  . Seasonal allergic rhinitis   . Sleep apnea   . Smoker   . Spinal stenosis   . 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 TIA (transient ischemic attack)    hx; now s/p PFO closure 2004 (in [place])  . Ulcer     PAST SURGICAL HISTORY: Past Surgical History:  Procedure Laterality Date  . BACK SURGERY     patient denies-just lumbar punctures  . BRONCHOSCOPY    . CARDIAC CATHETERIZATION  11/99   CI per PMH  . CATARACT EXTRACTION W/PHACO Right 11/24/2016   Procedure: CATARACT EXTRACTION PHACO AND INTRAOCULAR LENS PLACEMENT (IOC);  Surgeon: Birder Robson, MD;  Location: [facility] ORS;  Service: Ophthalmology;  Laterality: Right;  [place] 1:04.3 AP% 22.3 CDE 14.35 Fluid pack lot #  [id number]  . CATARACT EXTRACTION W/PHACO Left 12/29/2016   Procedure: CATARACT EXTRACTION PHACO AND INTRAOCULAR LENS PLACEMENT (IOC);  Surgeon: Birder Robson, MD;  Location: [facility] ORS;  Service: Ophthalmology;  Laterality: Left;  [place] 00:52 AP% 18.5 CDE 9.67 Fluid pack lot # [id number]  . COLONOSCOPY    . COLONOSCOPY WITH PROPOFOL N/A 05/25/2017   Procedure: COLONOSCOPY WITH PROPOFOL;  Surgeon: Jonathon Bellows, MD;  Location: [facility] ENDOSCOPY;  Service:  Gastroenterology;  Laterality: N/A;  . CORONARY ANGIOPLASTY     STENT  . CORONARY ARTERY BYPASS GRAFT     stent  . ESOPHAGOGASTRODUODENOSCOPY (EGD) WITH PROPOFOL N/A 05/25/2017   Procedure: ESOPHAGOGASTRODUODENOSCOPY (EGD) WITH PROPOFOL;  Surgeon: Jonathon Bellows, MD;  Location: [facility] ENDOSCOPY;  Service: Gastroenterology;  Laterality: N/A;  . EYE SURGERY    . FLEXIBLE BRONCHOSCOPY N/A 05/19/2017   Procedure: FLEXIBLE BRONCHOSCOPY;  Surgeon: Wilhelmina Mcardle, MD;  Location: [facility] ORS;  Service: Pulmonary;  Laterality: N/A;  . open heart surgery  2004   PFO repair  . UPPER GI ENDOSCOPY      FAMILY HISTORY Family History  Problem Relation Age of Onset  . Coronary artery disease Unknown        family hx  . Breast cancer Unknown        1st egree relative <50       ADVANCED DIRECTIVES:    HEALTH MAINTENANCE: Social History   Tobacco Use  . Smoking status: Current Every Day Smoker    Packs/day: 0.50    Years: 45.00    Pack years: 22.50    Types: Cigarettes  . Smokeless tobacco: Never Used  . Tobacco comment: 1 ppd +40 years  Substance Use Topics  . Alcohol use: Yes    Alcohol/week: 0.0 oz    Comment: weekly but last dose 1 month  . Drug use: No     Colonoscopy:  PAP:  Bone density:  Lipid panel:  No Known Allergies  Current Outpatient Medications  Medication Sig Dispense Refill  . acetaminophen (TYLENOL) 500 MG tablet Take 1,000 mg by mouth daily as needed for moderate pain or headache.    . carvedilol (COREG) 3.125 MG tablet TAKE 1 TABLET (3.125 MG TOTAL) BY MOUTH 2 (TWO) TIMES DAILY. 180 tablet 0  . Cholecalciferol (VITAMIN D) 2000 units CAPS Take 2,000 Units by mouth every evening.    . gabapentin (NEURONTIN) 300 MG capsule Take 1 capsule (300 mg total) by mouth 2 (two) times daily. (Patient taking differently: Take 300 mg by mouth at bedtime. ) 180 capsule 3  . Glucosamine HCl 1000 MG TABS Take 1,000 mg by mouth every evening.    . Glucosamine Sulfate (GLUCOSAMINE  RELIEF) 1000 MG TABS Take by mouth.    Marland Kitchen ipratropium-albuterol (DUONEB) 0.5-2.5 (3) MG/3ML SOLN TAKE 3 MLS BY NEBULIZATION EVERY 4 (FOUR) HOURS AS NEEDED. 630 mL 5  . Magnesium 250 MG TABS Take 250 mg by mouth every evening.    . megestrol (MEGACE) 40 MG tablet Take 1 tablet (40 mg total) by mouth daily. 30 tablet 2  . metFORMIN (GLUCOPHAGE-XR) 500 MG 24 hr tablet TAKE 1 TABLET (500 MG TOTAL) BY MOUTH DAILY WITH BREAKFAST. 90 tablet 0  . omeprazole (PRILOSEC) 20 MG capsule Take 20 mg by mouth daily.    Marland Kitchen oxymetazoline (AFRIN) 0.05 % nasal spray Place 1 spray into both nostrils daily as needed for congestion.    . pseudoephedrine (SUDAFED) 30 MG tablet Take 30 mg by mouth daily as needed for congestion.     Marland Kitchen  SYMBICORT 160-4.5 MCG/ACT inhaler INHALE 2 PUFFS INTO THE LUNGS 2 (TWO) TIMES DAILY. 10.2 Inhaler 4  . tiZANidine (ZANAFLEX) 4 MG tablet TAKE 1 TABLET (4 MG TOTAL) BY MOUTH NIGHTLY. 90 tablet 3  . varenicline (CHANTIX STARTING MONTH PAK) 0.5 MG X 11 & 1 MG X 42 tablet Take one 0.5 mg tablet by mouth once daily for 3 days, then increase to one 0.5 mg tablet twice daily for 4 days, then increase to one 1 mg tablet twice daily. 53 tablet 0   No current facility-administered medications for this visit.     OBJECTIVE: Vitals:   07/26/17 1202  BP: 133/77  Pulse: (!) 103  Resp: 18  Temp: (!) 97 F (36.1 C)     Body mass index is 19.23 kg/m.    ECOG FS:0 - Asymptomatic  General: Thin, no acute distress. Eyes: Pink conjunctiva, anicteric sclera. HEENT: Clear oropharynx without exudate.  No palpable lymphadenopathy. Lungs: Clear to auscultation bilaterally. Heart: Regular rate and rhythm. No rubs, murmurs, or gallops. Abdomen: Soft, nontender, nondistended. No organomegaly noted, normoactive bowel sounds. Musculoskeletal: No edema, cyanosis, or clubbing. Neuro: Alert, answering all questions appropriately. Cranial nerves grossly intact. Skin: No rashes or petechiae noted. Psych: Normal  affect.   LAB RESULTS:  Lab Results  Component Value Date   NA 132 (L) 06/14/2017   K 4.0 06/14/2017   CL 96 (L) 06/14/2017   CO2 26 06/14/2017   GLUCOSE 103 (H) 06/14/2017   BUN 13 06/14/2017   CREATININE 0.65 06/14/2017   CALCIUM 9.1 06/14/2017   PROT 9.4 (H) 06/14/2017   ALBUMIN 3.1 (L) 06/14/2017   AST 29 06/14/2017   ALT 25 06/14/2017   ALKPHOS 83 06/14/2017   BILITOT 0.5 06/14/2017   GFRNONAA >60 06/14/2017   GFRAA >60 06/14/2017    Lab Results  Component Value Date   WBC 10.3 07/26/2017   NEUTROABS 5.2 07/26/2017   HGB 10.1 (L) 07/26/2017   HCT 30.3 (L) 07/26/2017   MCV 95.4 07/26/2017   PLT 244 07/26/2017     STUDIES: No results found.  ASSESSMENT: Bone marrow biopsy proven CMML-1.  PLAN:    1. CMML: Bone marrow biopsy completed on August 30, 2015 confirmed the diagnosis. Repeat bone marrow biopsy on March 08, 2017 is essentially unchanged. Patient's absolute monocytosis has been greater than 1.0 and persistent since at least July 2014. The remainder of his blood work including BCR-ABL and ANA are negative or within normal limits. Previously, hepatitis C and HIV were also negative.  He will likely need treatment with azacitidine or decitabine at some point, but not at this time. He has no evidence of end organ involvement. CT scans from February 24, 2017 revealed stable lung nodules. Return to clinic in 3 months for further evaluation. 2. Pulmonary nodule: Most recent CT scan revealed unchanged.  Continue follow-up with pulmonology as indicated. 3. Weight loss: CT scans and bone marrow biopsy as above. Thyroid panel within normal limits. Encouraged supplements such as Ensure and Boost. Continue Megace 40 mg daily.  Patient has been evaluated by pulmonology with bronchoscopy, GI with endoscopies, and infectious disease without any obvious etiology for his weight loss.  Patient canceled his to dietary.  Return to clinic in 3 months for further evaluation.     Approximately 30 minutes was spent in discussion of which greater than 50% was consultation.  Patient expressed understanding and was in agreement with this plan. He also understands that He can call clinic at any  time with any questions, concerns, or complaints.    Lloyd Huger, MD   07/27/2017 10:57 AM

## 2017-07-26 ENCOUNTER — Inpatient Hospital Stay: Payer: Medicare HMO

## 2017-07-26 ENCOUNTER — Inpatient Hospital Stay: Payer: Medicare HMO | Attending: Oncology | Admitting: Oncology

## 2017-07-26 VITALS — BP 133/77 | HR 103 | Temp 97.0°F | Resp 18 | Wt 137.9 lb

## 2017-07-26 DIAGNOSIS — C9310 Chronic myelomonocytic leukemia not having achieved remission: Secondary | ICD-10-CM

## 2017-07-26 DIAGNOSIS — I255 Ischemic cardiomyopathy: Secondary | ICD-10-CM | POA: Diagnosis not present

## 2017-07-26 DIAGNOSIS — I2510 Atherosclerotic heart disease of native coronary artery without angina pectoris: Secondary | ICD-10-CM | POA: Diagnosis not present

## 2017-07-26 DIAGNOSIS — Z79899 Other long term (current) drug therapy: Secondary | ICD-10-CM

## 2017-07-26 DIAGNOSIS — I5020 Unspecified systolic (congestive) heart failure: Secondary | ICD-10-CM | POA: Insufficient documentation

## 2017-07-26 DIAGNOSIS — Z951 Presence of aortocoronary bypass graft: Secondary | ICD-10-CM | POA: Insufficient documentation

## 2017-07-26 DIAGNOSIS — G4730 Sleep apnea, unspecified: Secondary | ICD-10-CM | POA: Insufficient documentation

## 2017-07-26 DIAGNOSIS — Z7984 Long term (current) use of oral hypoglycemic drugs: Secondary | ICD-10-CM | POA: Diagnosis not present

## 2017-07-26 DIAGNOSIS — K219 Gastro-esophageal reflux disease without esophagitis: Secondary | ICD-10-CM | POA: Insufficient documentation

## 2017-07-26 DIAGNOSIS — J449 Chronic obstructive pulmonary disease, unspecified: Secondary | ICD-10-CM | POA: Insufficient documentation

## 2017-07-26 DIAGNOSIS — Z955 Presence of coronary angioplasty implant and graft: Secondary | ICD-10-CM | POA: Insufficient documentation

## 2017-07-26 DIAGNOSIS — E785 Hyperlipidemia, unspecified: Secondary | ICD-10-CM | POA: Insufficient documentation

## 2017-07-26 DIAGNOSIS — R911 Solitary pulmonary nodule: Secondary | ICD-10-CM

## 2017-07-26 DIAGNOSIS — E119 Type 2 diabetes mellitus without complications: Secondary | ICD-10-CM | POA: Diagnosis not present

## 2017-07-26 DIAGNOSIS — I110 Hypertensive heart disease with heart failure: Secondary | ICD-10-CM | POA: Diagnosis not present

## 2017-07-26 DIAGNOSIS — I252 Old myocardial infarction: Secondary | ICD-10-CM | POA: Diagnosis not present

## 2017-07-26 DIAGNOSIS — F17210 Nicotine dependence, cigarettes, uncomplicated: Secondary | ICD-10-CM | POA: Diagnosis not present

## 2017-07-26 DIAGNOSIS — R634 Abnormal weight loss: Secondary | ICD-10-CM | POA: Diagnosis not present

## 2017-07-26 DIAGNOSIS — Z8673 Personal history of transient ischemic attack (TIA), and cerebral infarction without residual deficits: Secondary | ICD-10-CM | POA: Diagnosis not present

## 2017-07-26 LAB — CBC WITH DIFFERENTIAL/PLATELET
Basophils Absolute: 0.1 K/uL (ref 0–0.1)
Basophils Relative: 1 %
Eosinophils Absolute: 0 K/uL (ref 0–0.7)
Eosinophils Relative: 0 %
HCT: 30.3 % — ABNORMAL LOW (ref 40.0–52.0)
Hemoglobin: 10.1 g/dL — ABNORMAL LOW (ref 13.0–18.0)
Lymphocytes Relative: 11 %
Lymphs Abs: 1.1 K/uL (ref 1.0–3.6)
MCH: 31.7 pg (ref 26.0–34.0)
MCHC: 33.2 g/dL (ref 32.0–36.0)
MCV: 95.4 fL (ref 80.0–100.0)
Monocytes Absolute: 3.9 K/uL — ABNORMAL HIGH (ref 0.2–1.0)
Monocytes Relative: 38 %
Neutro Abs: 5.2 K/uL (ref 1.4–6.5)
Neutrophils Relative %: 50 %
Platelets: 244 K/uL (ref 150–440)
RBC: 3.18 MIL/uL — ABNORMAL LOW (ref 4.40–5.90)
RDW: 16.2 % — ABNORMAL HIGH (ref 11.5–14.5)
WBC: 10.3 K/uL (ref 3.8–10.6)

## 2017-07-26 NOTE — Progress Notes (Signed)
Nutrition  Patient was no show for nutrition appointment at 10:30am today.  Discussed with RN if patient wants to see RD after MD appointment today would be available.    Mahogony Gilchrest B. Zenia Resides, [place], [place] Registered Dietitian [phone] (pager)

## 2017-09-07 ENCOUNTER — Telehealth: Payer: Self-pay | Admitting: Internal Medicine

## 2017-09-07 MED ORDER — BUPROPION HCL ER (XL) 150 MG PO TB24: 150.0000 mg | ORAL_TABLET | Freq: Every day | ORAL | 1 refills | Status: DC

## 2017-09-07 MED ORDER — UMECLIDINIUM-VILANTEROL 62.5-25 MCG/INH IN AEPB: 1.0000 | INHALATION_SPRAY | Freq: Every day | RESPIRATORY_TRACT | 4 refills | Status: DC

## 2017-09-07 NOTE — Telephone Encounter (Signed)
Pt calling asking if we can prescribe him Wellbutrin   For we gave him Chantix  and Spiriva and both are too expensive for him  He states he may be able to Anoro, but would like to try it before getting a 90 day refill on it.   If we can please [facility] [place]

## 2017-09-07 NOTE — Telephone Encounter (Signed)
Pt states the Spiriva and Chantix are very expensive. He states he has a free month coupon for Anoro and would like to know if we can do the Anoro and give him Wellbutrin to help him stop smoking. If so, please advise on Wellbutrin directions and dose.

## 2017-09-07 NOTE — Telephone Encounter (Signed)
Anoro is OK Wellbutrin SR 150 mg daily

## 2017-09-07 NOTE — Telephone Encounter (Signed)
Anoro and Wellbutrin sent to pharmacy. Pt informed. Nothing further needed.

## 2017-09-29 ENCOUNTER — Other Ambulatory Visit: Payer: Self-pay | Admitting: *Deleted

## 2017-09-29 MED ORDER — BUPROPION HCL ER (XL) 150 MG PO TB24: 150.0000 mg | ORAL_TABLET | Freq: Every day | ORAL | 0 refills | Status: DC

## 2017-09-29 NOTE — Progress Notes (Signed)
[facility] from [facility]. Request #[id number] this was sent with 0 refill.

## 2017-10-15 ENCOUNTER — Other Ambulatory Visit: Payer: Self-pay | Admitting: *Deleted

## 2017-10-15 DIAGNOSIS — C9310 Chronic myelomonocytic leukemia not having achieved remission: Secondary | ICD-10-CM

## 2017-10-15 DIAGNOSIS — C9311 Chronic myelomonocytic leukemia, in remission: Secondary | ICD-10-CM

## 2017-10-17 NOTE — Progress Notes (Signed)
[place]  Telephone:(336) [phone] Fax:(336) [phone]  ID: Tony Watson OB: 08-30-1950  MR#: [id number]  FIE#:[id number]  Patient Care Team: Owens Loffler, MD as PCP - General Ola Spurr Cheral Marker, MD (Infectious Diseases) [name], MD as Consulting Physician (Pulmonary Disease)  CHIEF COMPLAINT: Bone marrow biopsy proven CMML-1.  INTERVAL HISTORY: Patient returns to clinic today for routine 74-monthfollow-up.  His weight has remained essentially stable, but he has lost 1-2 pounds in the last 3 months. He has been evaluated by pulmonology, infectious disease, and GI with no obvious etiology. He continues to have persistent weakness and fatigue. He denies any fevers or night sweats. He has no neurologic complaints. He has no chest pain or shortness of breath. He denies any nausea, vomiting, constipation, or diarrhea.  He has no melena or hematochezia.  He has no urinary complaints. Patient offers no further specific complaints today.  REVIEW OF SYSTEMS:   Review of Systems  Constitutional: Positive for malaise/fatigue and weight loss. Negative for diaphoresis and fever.  HENT: Negative.  Negative for sore throat.   Respiratory: Negative for cough and shortness of breath.   Cardiovascular: Negative.  Negative for chest pain and leg swelling.  Gastrointestinal: Negative.  Negative for abdominal pain, nausea and vomiting.  Genitourinary: Negative.   Musculoskeletal: Negative.   Neurological: Positive for weakness.  Endo/Heme/Allergies: Does not bruise/bleed easily.  Psychiatric/Behavioral: Negative.  The patient is not nervous/anxious.     As per HPI. Otherwise, a complete review of systems is negative.  PAST MEDICAL HISTORY: Past Medical History:  Diagnosis Date  . Arthritis   . Asthma   . Bronchitis   . CAD (coronary artery disease)    [place] [facility] [place] [facility] [place]: EF 50%, patent RCA stent. no obstructive  disease. last myoview 2008: EF 42%, inferior infarct, no ischemia.   . Cancer ([place]    CMML Lukemia  . CHF (congestive heart failure) ([place]   . COPD (chronic obstructive pulmonary disease) ([place]   . Cough    CHRONIC  . Diabetic autonomic neuropathy associated with type 2 diabetes mellitus ([place] 03/27/2015  . Diverticulosis 2002  . DM2 (diabetes mellitus, type 2) ([facility])    A1c 6.0% 12/11  . Dyspnea   . Edema   . GERD (gastroesophageal reflux disease)   . H/O wheezing   . HTN (hypertension)   . Hyperlipidemia   . Ischemic cardiomyopathy    mild with EF 42% on myoviews 2008.  . Medical non-compliance   . Myocardial infarction ([place]   . Nicotine dependence    chronic, active  . Pneumonia    in the past  . Seasonal allergic rhinitis   . Sleep apnea   . Smoker   . Spinal stenosis   . Stroke ([place]    RECURRENT AFFECTING MEMORY  . 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 TIA (transient ischemic attack)    hx; now s/p PFO closure 2004 [place]  . Ulcer     PAST SURGICAL HISTORY: Past Surgical History:  Procedure Laterality Date  . BACK SURGERY     patient denies-just lumbar punctures  . BRONCHOSCOPY    . CARDIAC CATHETERIZATION  11/99   CI per [facility]  . CATARACT EXTRACTION W/PHACO Right 11/24/2016   Procedure: CATARACT EXTRACTION PHACO AND INTRAOCULAR LENS PLACEMENT (IOC);  Surgeon: WBirder Robson MD;  Location: [facility] ORS;  Service: Ophthalmology;  Laterality: Right;  [place]1:04.3  AP% 22.3 CDE 14.35 Fluid pack lot # [id number]  . CATARACT EXTRACTION W/PHACO Left 12/29/2016   Procedure: CATARACT EXTRACTION PHACO AND INTRAOCULAR LENS PLACEMENT (IOC);  Surgeon: Birder Robson, MD;  Location: [facility] ORS;  Service: Ophthalmology;  Laterality: Left;  [place] 00:52 AP% 18.5 CDE 9.67 Fluid pack lot # [id number]  . COLONOSCOPY    . COLONOSCOPY WITH PROPOFOL N/A 05/25/2017   Procedure: COLONOSCOPY WITH PROPOFOL;  Surgeon: Jonathon Bellows, MD;  Location[facility]  ENDOSCOPY;  Service: Gastroenterology;  Laterality: N/A;  . CORONARY ANGIOPLASTY     STENT  . CORONARY ARTERY BYPASS GRAFT     stent  . ESOPHAGOGASTRODUODENOSCOPY (EGD) WITH PROPOFOL N/A 05/25/2017   Procedure: ESOPHAGOGASTRODUODENOSCOPY (EGD) WITH PROPOFOL;  Surgeon: Jonathon Bellows, MD;  Location: [facility] ENDOSCOPY;  Service: Gastroenterology;  Laterality: N/A;  . EYE SURGERY    . FLEXIBLE BRONCHOSCOPY N/A 05/19/2017   Procedure: FLEXIBLE BRONCHOSCOPY;  Surgeon: Wilhelmina Mcardle, MD;  Location: [facility] ORS;  Service: Pulmonary;  Laterality: N/A;  . open heart surgery  2004   PFO repair  . UPPER GI ENDOSCOPY      FAMILY HISTORY Family History  Problem Relation Age of Onset  . Coronary artery disease Unknown        family hx  . Breast cancer Unknown        1st egree relative <50       ADVANCED DIRECTIVES:    HEALTH MAINTENANCE: Social History   Tobacco Use  . Smoking status: Current Every Day Smoker    Packs/day: 0.50    Years: 45.00    Pack years: 22.50    Types: Cigarettes  . Smokeless tobacco: Never Used  . Tobacco comment: 1 ppd +40 years  Substance Use Topics  . Alcohol use: Yes    Alcohol/week: 0.0 oz    Comment: weekly but last dose 1 month  . Drug use: No     Colonoscopy:  PAP:  Bone density:  Lipid panel:  No Known Allergies  Current Outpatient Medications  Medication Sig Dispense Refill  . acetaminophen (TYLENOL) 500 MG tablet Take 1,000 mg by mouth daily as needed for moderate pain or headache.    Marland Kitchen buPROPion (WELLBUTRIN XL) 150 MG 24 hr tablet Take 1 tablet (150 mg total) by mouth daily. 90 tablet 0  . carvedilol (COREG) 3.125 MG tablet TAKE 1 TABLET (3.125 MG TOTAL) BY MOUTH 2 (TWO) TIMES DAILY. 180 tablet 0  . Cholecalciferol (VITAMIN D) 2000 units CAPS Take 2,000 Units by mouth every evening.    . gabapentin (NEURONTIN) 300 MG capsule Take 1 capsule (300 mg total) by mouth 2 (two) times daily. (Patient taking differently: Take 300 mg by mouth at  bedtime. ) 180 capsule 3  . Glucosamine HCl 1000 MG TABS Take 1,000 mg by mouth every evening.    . Glucosamine Sulfate (GLUCOSAMINE RELIEF) 1000 MG TABS Take by mouth.    Marland Kitchen ipratropium-albuterol (DUONEB) 0.5-2.5 (3) MG/3ML SOLN TAKE 3 MLS BY NEBULIZATION EVERY 4 (FOUR) HOURS AS NEEDED. 630 mL 5  . Magnesium 250 MG TABS Take 250 mg by mouth every evening.    . megestrol (MEGACE) 40 MG tablet Take 1 tablet (40 mg total) by mouth daily. 30 tablet 2  . metFORMIN (GLUCOPHAGE-XR) 500 MG 24 hr tablet TAKE 1 TABLET (500 MG TOTAL) BY MOUTH DAILY WITH BREAKFAST. 90 tablet 0  . omeprazole (PRILOSEC) 20 MG capsule Take 20 mg by mouth daily.    Marland Kitchen oxymetazoline (AFRIN) 0.05 % nasal spray  Place 1 spray into both nostrils daily as needed for congestion.    . pseudoephedrine (SUDAFED) 30 MG tablet Take 30 mg by mouth daily as needed for congestion.     . SYMBICORT 160-4.5 MCG/ACT inhaler INHALE 2 PUFFS INTO THE LUNGS 2 (TWO) TIMES DAILY. 10.2 Inhaler 4  . tiZANidine (ZANAFLEX) 4 MG tablet TAKE 1 TABLET (4 MG TOTAL) BY MOUTH NIGHTLY. 90 tablet 3  . umeclidinium-vilanterol (ANORO ELLIPTA) 62.5-25 MCG/INH AEPB Inhale 1 puff into the lungs daily. 60 each 4  . varenicline (CHANTIX STARTING MONTH PAK) 0.5 MG X 11 & 1 MG X 42 tablet Take one 0.5 mg tablet by mouth once daily for 3 days, then increase to one 0.5 mg tablet twice daily for 4 days, then increase to one 1 mg tablet twice daily. 53 tablet 0   No current facility-administered medications for this visit.     OBJECTIVE: Vitals:   10/18/17 1452  BP: 130/78  Pulse: (!) 116  Resp: 20  Temp: 97.8 F (36.6 C)     Body mass index is 18.83 kg/m.    ECOG FS:0 - Asymptomatic  General: Thin, no acute distress. Eyes: Pink conjunctiva, anicteric sclera. HEENT: Clear oropharynx without exudate.  No palpable lymphadenopathy. Lungs: Clear to auscultation bilaterally. Heart: Regular rate and rhythm. No rubs, murmurs, or gallops. Abdomen: Soft, nontender,  nondistended. No organomegaly noted, normoactive bowel sounds. Musculoskeletal: No edema, cyanosis, or clubbing. Neuro: Alert, answering all questions appropriately. Cranial nerves grossly intact. Skin: No rashes or petechiae noted. Psych: Normal affect.   LAB RESULTS:  Lab Results  Component Value Date   NA 132 (L) 06/14/2017   K 4.0 06/14/2017   CL 96 (L) 06/14/2017   CO2 26 06/14/2017   GLUCOSE 103 (H) 06/14/2017   BUN 13 06/14/2017   CREATININE 0.65 06/14/2017   CALCIUM 9.1 06/14/2017   PROT 9.4 (H) 06/14/2017   ALBUMIN 3.1 (L) 06/14/2017   AST 29 06/14/2017   ALT 25 06/14/2017   ALKPHOS 83 06/14/2017   BILITOT 0.5 06/14/2017   GFRNONAA >60 06/14/2017   GFRAA >60 06/14/2017    Lab Results  Component Value Date   WBC 9.1 10/18/2017   NEUTROABS 3.3 10/18/2017   HGB 10.7 (L) 10/18/2017   HCT 31.1 (L) 10/18/2017   MCV 95.9 10/18/2017   PLT 244 10/18/2017     STUDIES: No results found.  ASSESSMENT: Bone marrow biopsy proven CMML-1.  PLAN:    1. CMML: Bone marrow biopsy completed on August 30, 2015 confirmed the diagnosis. Repeat bone marrow biopsy on March 08, 2017 is essentially unchanged. Patient's absolute monocytosis has been greater than 1.0 and persistent since at least July 2014. The remainder of his blood work including BCR-ABL and ANA are negative or within normal limits. Previously, hepatitis C and HIV were also negative.  He will likely need treatment with azacitidine or decitabine at some point, but not at this time. He has no evidence of end organ involvement. CT scans from February 24, 2017 revealed stable lung nodules. Return to clinic in 3 months for repeat laboratory work and further evaluation. 2. Pulmonary nodules: Most recent CT scan revealed unchanged.  Continue follow-up with pulmonology as indicated. 3. Weight loss: CT scans and bone marrow biopsy as above. Thyroid panel within normal limits. Encouraged supplements such as Ensure and Boost. Continue  Megace 40 mg daily.  Patient has been evaluated by pulmonology with bronchoscopy, GI with endoscopies, and infectious disease without any obvious etiology for his  weight loss.  Patient has consultation with dietary today.  Return to clinic in 3 months as above.    Approximately 20 minutes was spent in discussion of which greater than 50% was consultation.  Patient expressed understanding and was in agreement with this plan. He also understands that He can call clinic at any time with any questions, concerns, or complaints.    Lloyd Huger, MD   10/22/2017 10:59 AM

## 2017-10-18 ENCOUNTER — Inpatient Hospital Stay: Payer: Medicare HMO | Attending: Oncology

## 2017-10-18 ENCOUNTER — Inpatient Hospital Stay: Payer: Medicare HMO | Admitting: Oncology

## 2017-10-18 ENCOUNTER — Inpatient Hospital Stay: Payer: Medicare HMO

## 2017-10-18 VITALS — BP 130/78 | HR 116 | Temp 97.8°F | Resp 20 | Wt 135.0 lb

## 2017-10-18 DIAGNOSIS — Z8673 Personal history of transient ischemic attack (TIA), and cerebral infarction without residual deficits: Secondary | ICD-10-CM | POA: Insufficient documentation

## 2017-10-18 DIAGNOSIS — Z79899 Other long term (current) drug therapy: Secondary | ICD-10-CM | POA: Diagnosis not present

## 2017-10-18 DIAGNOSIS — I2510 Atherosclerotic heart disease of native coronary artery without angina pectoris: Secondary | ICD-10-CM | POA: Diagnosis not present

## 2017-10-18 DIAGNOSIS — I110 Hypertensive heart disease with heart failure: Secondary | ICD-10-CM | POA: Insufficient documentation

## 2017-10-18 DIAGNOSIS — Z955 Presence of coronary angioplasty implant and graft: Secondary | ICD-10-CM | POA: Insufficient documentation

## 2017-10-18 DIAGNOSIS — E785 Hyperlipidemia, unspecified: Secondary | ICD-10-CM | POA: Diagnosis not present

## 2017-10-18 DIAGNOSIS — C9311 Chronic myelomonocytic leukemia, in remission: Secondary | ICD-10-CM

## 2017-10-18 DIAGNOSIS — F17210 Nicotine dependence, cigarettes, uncomplicated: Secondary | ICD-10-CM

## 2017-10-18 DIAGNOSIS — G4730 Sleep apnea, unspecified: Secondary | ICD-10-CM | POA: Diagnosis not present

## 2017-10-18 DIAGNOSIS — I255 Ischemic cardiomyopathy: Secondary | ICD-10-CM | POA: Diagnosis not present

## 2017-10-18 DIAGNOSIS — Z7984 Long term (current) use of oral hypoglycemic drugs: Secondary | ICD-10-CM | POA: Insufficient documentation

## 2017-10-18 DIAGNOSIS — K219 Gastro-esophageal reflux disease without esophagitis: Secondary | ICD-10-CM | POA: Insufficient documentation

## 2017-10-18 DIAGNOSIS — I252 Old myocardial infarction: Secondary | ICD-10-CM | POA: Insufficient documentation

## 2017-10-18 DIAGNOSIS — E119 Type 2 diabetes mellitus without complications: Secondary | ICD-10-CM | POA: Diagnosis not present

## 2017-10-18 DIAGNOSIS — Z951 Presence of aortocoronary bypass graft: Secondary | ICD-10-CM | POA: Diagnosis not present

## 2017-10-18 DIAGNOSIS — J449 Chronic obstructive pulmonary disease, unspecified: Secondary | ICD-10-CM | POA: Diagnosis not present

## 2017-10-18 DIAGNOSIS — I5020 Unspecified systolic (congestive) heart failure: Secondary | ICD-10-CM | POA: Insufficient documentation

## 2017-10-18 DIAGNOSIS — Z9114 Patient's other noncompliance with medication regimen: Secondary | ICD-10-CM | POA: Insufficient documentation

## 2017-10-18 DIAGNOSIS — C9310 Chronic myelomonocytic leukemia not having achieved remission: Secondary | ICD-10-CM

## 2017-10-18 LAB — CBC WITH DIFFERENTIAL/PLATELET
BAND NEUTROPHILS: 3 %
Basophils Absolute: 0.2 10*3/uL — ABNORMAL HIGH (ref 0–0.1)
Basophils Relative: 2 %
Eosinophils Absolute: 0.1 10*3/uL (ref 0–0.7)
Eosinophils Relative: 1 %
HCT: 31.1 % — ABNORMAL LOW (ref 40.0–52.0)
HEMOGLOBIN: 10.7 g/dL — AB (ref 13.0–18.0)
LYMPHS ABS: 1.8 10*3/uL (ref 1.0–3.6)
Lymphocytes Relative: 20 %
MCH: 33 pg (ref 26.0–34.0)
MCHC: 34.4 g/dL (ref 32.0–36.0)
MCV: 95.9 fL (ref 80.0–100.0)
METAMYELOCYTES PCT: 1 %
MYELOCYTES: 1 %
Monocytes Absolute: 3.7 10*3/uL — ABNORMAL HIGH (ref 0.2–1.0)
Monocytes Relative: 41 %
NEUTROS ABS: 3.3 10*3/uL (ref 1.4–6.5)
Neutrophils Relative %: 31 %
Platelets: 244 10*3/uL (ref 150–440)
RBC: 3.24 MIL/uL — ABNORMAL LOW (ref 4.40–5.90)
RDW: 15.6 % — ABNORMAL HIGH (ref 11.5–14.5)
WBC: 9.1 10*3/uL (ref 3.8–10.6)

## 2017-10-18 NOTE — Progress Notes (Signed)
Patient denies any concerns today.  

## 2017-10-18 NOTE — Progress Notes (Signed)
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  Patient has had workup for pulmonology, GI and infectious disease without significant findings.  Past medical history of CAD, CHF, COPD, DM, GERD, HTN, HLD, cardiomyopathy, MI, TIA, stroke.    Met with patient and wife in clinic this pm following MD visit. Patient reports decreased appetite, fatigue, weakness, taste changes, shortness of breath, dry mouth.  Patient reports had uvula removed by ENT due to sore thorat and still has sore throat and at times feels like razor blades in mouth.  Patient reports typically eats 2 eggs and toast or cereal or oatmeal with boost high protein shake.  Then for lunch has doughnut type food item with peanut butter that wife prepares.  Dinner is meat (few bites, vegetable, potato salad (2 tbsp). Bedtime has pudding or custard pie that wife prepares.  Reports typically has bowel movement every other day or every day.     NUTRITION - FOCUSED PHYSICAL EXAM: Nutrition Assessment and Review - 10/18/17 1602      Subcutaneous Fat   Orbital Region  Severe depletion    Buccal Region  Severe depletion      Limited exam, patient fully clothed   MEDICATIONS: megace   LABS: reviewed   ANTHROPOMETRICS: Height:   71 inches Weight:  132 lb BMI:  19 UBW:   198-225 lb per pateint    Noted 10/27/16 183 lb.  26% weight loss in the last year, significant  ESTIMATED ENERGY NEEDS:  Kcal:  1800-2100 calories/d Protein: 90-105 g Fluid:  2.1 L/d   NUTRITION DIAGNOSIS:  Severe Malnutrition related to decreased appetite, early satiety(shortness of breath, fatigue) as evidenced by percent weight loss, energy intake < or equal to 75% for > or equal to 1 month.   DOCUMENTATION CODES:  Severe malnutrition in context of chronic illness   INTERVENTION:  Encouraged small frequent meals to help with fatigue and shortness of breath.   Discussed soft moist high calorie, high protein foods. Handout given to patient. Provided high calorie, high protein recipes that wife can prepare.  Recommend liberalize diet restrictions at this time and focus on high calorie, high protein foods to prevent weight loss.   Discussed dry mouth and strategies to help. Discussed oral nutrition supplements and provided samples today with coupons for patient to try.  Contact information provided to wife and patient.   Per Dr. Grayland Ormond weight loss thought to be associated with severe COPD, respiratory therapy.  CMML not causing weight loss per MD, no treatment currently.     GOAL:  Patient will meet greater than or equal to 90% of their needs, Weight gain   MONITOR:  PO intake, Weight trends   Next Visit: June 10 following MD appt   Dorian Duval B. Zenia Resides, [place], [place] Registered Dietitian [phone] (pager)

## 2017-10-20 LAB — COMP PANEL: LEUKEMIA/LYMPHOMA

## 2017-10-27 ENCOUNTER — Other Ambulatory Visit: Payer: Self-pay | Admitting: Family Medicine

## 2017-10-27 NOTE — Telephone Encounter (Signed)
Dr. Lorelei Pont has not seen Tony Watson in over a year.  Will forward to pulmonologist to see if he will refill.

## 2017-11-11 ENCOUNTER — Encounter: Payer: Self-pay | Admitting: Internal Medicine

## 2017-11-11 ENCOUNTER — Ambulatory Visit: Payer: Medicare HMO | Admitting: Internal Medicine

## 2017-11-11 VITALS — BP 108/58 | HR 120 | Resp 16 | Ht 71.0 in | Wt 134.0 lb

## 2017-11-11 DIAGNOSIS — J449 Chronic obstructive pulmonary disease, unspecified: Secondary | ICD-10-CM | POA: Diagnosis not present

## 2017-11-11 MED ORDER — TIOTROPIUM BROMIDE MONOHYDRATE 18 MCG IN CAPS: 18.0000 ug | ORAL_CAPSULE | Freq: Every day | RESPIRATORY_TRACT | 2 refills | Status: DC

## 2017-11-11 MED ORDER — FLUTICASONE-SALMETEROL 250-50 MCG/DOSE IN AEPB: 1.0000 | INHALATION_SPRAY | Freq: Two times a day (BID) | RESPIRATORY_TRACT | 12 refills | Status: DC

## 2017-11-11 NOTE — Progress Notes (Signed)
Name: Tony Watson MRN: [id number] DOB: 06/11/1951     CONSULTATION DATE: 11/11/2017   REFERRING MD : Grayland Ormond  CHIEF COMPLAINT: fatigue   STUDIES:  CT chest 02/2017 I have Independently reviewed images of  CT chest  Interpretation: Bilateral multifocal micronodular densities and opacifications noted   Patient underwent diagnostic bronchoscopy  05/19/17 Patient's pathology and diagnosis is consistent with respiratory bronchiolitis No evidence of malignancy final diagnosis is consistent with pigmented macrophages consistent with respiratory bronchiolitis All infectious disease studies have been negative  Pathology reports discussed with patient   PREVIOUS OV Patient underwent diagnostic bronchoscopy last week and shown to have respiratory bronchiolitis which is heavily consistent with smoking  HPI At this time is no signs of infection Does no signs of acute heart failure at this time Have strongly recommended him to stop smoking Patient also had abnormal overnight pulse oximetry and now is on 2 L nasal cannula at nighttime He should have 6 minute walk test which was within normal limits  pulmonary function testing on 05/13/17 FEV1 is 2.3 L which is 65% predicted FVC is 3.4 L which is 76% predicted Exline ratio is 68 TLC is 14 L which is 208% predicted RV is 10.8 L which is 42% predicted DLCO is abnormal for volumes adjusted Patient has moderate to severe obstructive pulmonary disease along with hyperinflation and air trapping with abnormal diffusion capacity    REVIEW OF SYSTEMS:   Constitutional: Negative for fever, chills, + weight loss, + malaise/fatigue and + diaphoresis.  HENT: Negative for hearing loss, ear pain, nosebleeds, congestion, sore throat, neck pain, tinnitus and ear discharge.   Eyes: Negative for blurred vision, double vision, photophobia, pain, discharge and redness.  Respiratory: Negative for cough, hemoptysis, sputum production, +shortness of  breath,+ wheezing and stridor.   Cardiovascular: Negative for chest pain, palpitations, orthopnea, claudication, leg swelling and PND.  Gastrointestinal: Negative for heartburn, nausea, vomiting, abdominal pain, diarrhea, constipation, blood in stool and melena.  Genitourinary: Negative for dysuria, urgency, frequency, hematuria and flank pain.  Musculoskeletal: Negative for myalgias, back pain, joint pain and falls.  ALL OTHER ROS ARE NEGATIVE     VITAL SIGNS: BP (!) 108/58 (BP Location: Left Arm, Cuff Size: Normal)   Pulse (!) 120   Resp 16   Ht 5\' 11"  (1.803 m)   Wt 134 lb (60.8 kg)   SpO2 91%   BMI 18.69 kg/m   Physical Examination:   GENERAL:NAD, no fevers, chills, +thin +cachexia HEAD: Normocephalic, atraumatic.  EYES: Pupils equal, round, reactive to light. Extraocular muscles intact. No scleral icterus.  MOUTH: Moist mucosal membrane.   EAR, NOSE, THROAT: Clear without exudates. No external lesions.  NECK: Supple. No thyromegaly. No nodules. No JVD.  PULMONARY:CTA B/L no wheezes, no crackles, no rhonchi CARDIOVASCULAR: S1 and S2. Regular rate and rhythm. No murmurs, rubs, or gallops. No edema.  GASTROINTESTINAL: Soft, nontender, nondistended. No masses. Positive bowel sounds.  SKIN: No ulceration, lesions, rashes, or cyanosis. Skin warm and dry. Turgor intact.  PSYCHIATRIC: Mood, affect within normal limits. The patient is awake, alert and oriented x 3. Insight, judgment intact.       ASSESSMENT / PLAN: 67 y.o. 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 Patient now with chronic hypoxic respiratory failure on 2 L nasal  cannula  #1 Pathological results confirm respiratory bronchiolitis with pigmented macrophages on the diagnostic bronchoscopy which was performed on 05/19/17  This time patient will need  to stop smoking immediately I have discussed patient about smoking cessation   I will not start prednisone at this time  #2 moderate to severe COPD Gold stage C Patient advised to stop smoking Will need to address his insurance company to figure out which medicines are covered -start advair 250/50 Start [employer]  If inhaler therapy does not help, will try NEB therapy  #3 chronic hypoxic respiratory failure Continue oxygen as prescribed Patient uses and benefits from oxygen  #4Follow-up with oncology   #5. Deconditioned state Recommend exercise as tolerated   Patient satisfied with Plan of action and management. All questions answered Follow-up in [place], M.D.  [name] [facility]  Medical Director [place] Director [facility] Cardio-Pulmonary Department

## 2017-11-11 NOTE — Patient Instructions (Signed)
START ADVAIR AND SPIRIVA  STOP SMOKING

## 2017-12-17 ENCOUNTER — Telehealth: Payer: Self-pay | Admitting: Internal Medicine

## 2017-12-17 NOTE — Telephone Encounter (Signed)
lmov to r/s appt Due to Dr. Mortimer Fries Schedule Change

## 2017-12-21 NOTE — Telephone Encounter (Signed)
lmov to r/s appt with Dr. Mortimer Fries

## 2017-12-24 NOTE — Telephone Encounter (Signed)
R/s 7/18

## 2017-12-27 ENCOUNTER — Other Ambulatory Visit: Payer: Self-pay

## 2017-12-27 ENCOUNTER — Ambulatory Visit (INDEPENDENT_AMBULATORY_CARE_PROVIDER_SITE_OTHER): Payer: Medicare HMO | Admitting: Family Medicine

## 2017-12-27 ENCOUNTER — Encounter: Payer: Self-pay | Admitting: Family Medicine

## 2017-12-27 VITALS — BP 110/60 | HR 76 | Temp 98.3°F | Ht 71.0 in | Wt 139.5 lb

## 2017-12-27 DIAGNOSIS — E440 Moderate protein-calorie malnutrition: Secondary | ICD-10-CM

## 2017-12-27 DIAGNOSIS — J441 Chronic obstructive pulmonary disease with (acute) exacerbation: Secondary | ICD-10-CM | POA: Diagnosis not present

## 2017-12-27 DIAGNOSIS — J209 Acute bronchitis, unspecified: Secondary | ICD-10-CM | POA: Diagnosis not present

## 2017-12-27 DIAGNOSIS — J9611 Chronic respiratory failure with hypoxia: Secondary | ICD-10-CM

## 2017-12-27 DIAGNOSIS — Z9981 Dependence on supplemental oxygen: Secondary | ICD-10-CM

## 2017-12-27 DIAGNOSIS — J440 Chronic obstructive pulmonary disease with acute lower respiratory infection: Secondary | ICD-10-CM

## 2017-12-27 MED ORDER — PREDNISONE 20 MG PO TABS: ORAL_TABLET | ORAL | 0 refills | Status: DC

## 2017-12-27 MED ORDER — DOXYCYCLINE HYCLATE 100 MG PO TABS: 100.0000 mg | ORAL_TABLET | Freq: Two times a day (BID) | ORAL | 0 refills | Status: AC

## 2017-12-27 NOTE — Progress Notes (Signed)
Dr. Frederico Hamman T. Yzabelle Calles, MD, [facility] [place], [address] Phone: [phone] Fax: [phone]  12/27/2017  Patient: Tony Watson, MRN: [id number], DOB: 28-Dec-1950, 67 y.o.  Primary Physician:  Owens Loffler, MD   Chief Complaint  Patient presents with  . Sore Throat  . Sinus Pressure  . Cough   Subjective:   Tony Watson is a 67 y.o. very pleasant male patient who presents with the following:  COPD exac Acute illness  Very pleasant 67 year old gentleman with leukemia, severe COPD, chronic respiratory failure on home oxygen, and he presents with some coughing, sore throat, sinus pressure, and this is all worsened within the last few days.  He does report that he is compliant with using his inhalers.  He has had a significant amount of weight loss without a direct cause found.  He has been evaluated extensively by oncology, pulmonology, infectious disease.  Past Medical History, Surgical History, Social History, Family History, Problem List, Medications, and Allergies have been reviewed and updated if relevant.  Patient Active Problem List   Diagnosis Date Noted  . 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 COPD, severe 03/23/2011    Priority: High  . Type 2 diabetes mellitus with vascular disease ([place]) 01/02/2010    Priority: High  . Diabetic autonomic neuropathy associated with type 2 diabetes mellitus ([place]) 03/27/2015  . History of inferior MI (myocardial infarction) 10/14/2013  . CAD (coronary artery disease)   . HTN (hypertension)   . History of noncompliance with medical treatment 05/30/2012  . Systolic heart failure secondary to coronary artery disease ([place]) 05/30/2012  . Other emphysema ([place]) 03/23/2011  . ALLERGIC RHINITIS CAUSE UNSPECIFIED 07/31/2010  . TOBACCO USE 04/17/2009  . TRANSIENT ISCHEMIC ATTACKS, HX OF 04/17/2009  .  Hyperlipidemia 12/19/2008  . HYPERTENSION, UNSPECIFIED 12/19/2008    Past Medical History:  Diagnosis Date  . Arthritis   . Asthma   . Bronchitis   . CAD (coronary artery disease)    inferior MI 11/99 (in [place]) with PCI to [facility]. last [place] 2004 [facility]): EF 50%, patent RCA stent. no obstructive disease. last myoview 2008: EF 42%, inferior infarct, no ischemia.   . Cancer ([place])    CMML [place]  . CHF (congestive heart failure) ([place])   . COPD (chronic obstructive pulmonary disease) ([place])   . Cough    CHRONIC  . Diabetic autonomic neuropathy associated with type 2 diabetes mellitus ([place]) 03/27/2015  . Diverticulosis 2002  . DM2 (diabetes mellitus, type 2) ([facility])    A1c 6.0% 12/11  . Dyspnea   . Edema   . GERD (gastroesophageal reflux disease)   . H/O wheezing   . HTN (hypertension)   . Hyperlipidemia   . Ischemic cardiomyopathy    mild with EF 42% on myoviews 2008.  . Medical non-compliance   . Myocardial infarction ([place])   . Nicotine dependence    chronic, active  . Pneumonia    in the past  . Seasonal allergic rhinitis   . Sleep apnea   . Smoker   . Spinal stenosis   . Stroke ([place])    RECURRENT AFFECTING MEMORY  . Systolic heart failure secondary to coronary artery disease ([place]) 05/30/2012  . TIA (transient ischemic attack)    hx; now s/p PFO closure 2004 (in [place])  . Ulcer     Past Surgical History:  Procedure Laterality Date  .  BACK SURGERY     patient denies-just lumbar punctures  . BRONCHOSCOPY    . CARDIAC CATHETERIZATION  11/99   CI per PM[facility]  . CATARACT EXTRACTION W/PHACO Right 11/24/2016   Procedure: CATARACT EXTRACTION PHACO AND INTRAOCULAR LENS PLACEMENT (IOC);  Surgeon: Birder Robson, MD;  Location: [facility] ORS;  Service: Ophthalmology;  Laterality: Right;  [place] 1:04.3 AP% 22.3 CDE 14.35 Fluid pack lot # [id number]  . CATARACT EXTRACTION W/PHACO Left 12/29/2016   Procedure: CATARACT EXTRACTION PHACO AND INTRAOCULAR LENS PLACEMENT (IOC);  Surgeon: Birder Robson, MD;  Location: [facility] ORS;  Service: Ophthalmology;  Laterality: Left;  [place] 00:52 AP% 18.5 CDE 9.67 Fluid pack lot # [id number]  . COLONOSCOPY    . COLONOSCOPY WITH PROPOFOL N/A 05/25/2017   Procedure: COLONOSCOPY WITH PROPOFOL;  Surgeon: Jonathon Bellows, MD;  Location: [facility] ENDOSCOPY;  Service: Gastroenterology;  Laterality: N/A;  . CORONARY ANGIOPLASTY     STENT  . CORONARY ARTERY BYPASS GRAFT     stent  . ESOPHAGOGASTRODUODENOSCOPY (EGD) WITH PROPOFOL N/A 05/25/2017   Procedure: ESOPHAGOGASTRODUODENOSCOPY (EGD) WITH PROPOFOL;  Surgeon: Jonathon Bellows, MD;  Location[facility] END[facility]COPY;  Service: Gastroenterology;  Laterality: N/A;  . EYE SURGERY    . FLEXIBLE BRONCHOSCOPY N/A 05/19/2017   Procedure: FLEXIBLE BRONCHOSCOPY;  Surgeon: Wilhelmina Mcardle, MD;  Location: [facility] ORS;  Service: Pulmonary;  Laterality: N/A;  . open heart surgery  2004   PFO repair  . UPPER GI ENDOSCOPY      Social History   Socioeconomic History  . Marital status: Married    Spouse name: Not on file  . Number of children: Not on file  . Years of education: Not on file  . Highest education level: Not on file  Occupational History  . Not on file  Social Needs  . Financial resource strain: Not on file  . Food insecurity:    Worry: Not on file    Inability: Not on file  . Transportation needs:    Medical: Not on file    Non-medical: Not on file  Tobacco Use  . Smoking status: Current Every Day Smoker    Packs/day: 0.50    Years: 45.00    Pack years: 22.50    Types: Cigarettes  . Smokeless tobacco: Never Used  . Tobacco comment: 1 ppd +40 years  Substance and Sexual Activity  . Alcohol use: Yes    Alcohol/week: 0.0 oz    Comment: weekly but last dose 1 month  . Drug use: No  . Sexual activity: Not on file  Lifestyle  . Physical activity:    Days per week: Not on file    Minutes per session: Not on file  . Stress: Not on file  Relationships  . Social connections:    Talks on phone: Not on file      Gets together: Not on file    Attends religious service: Not on file    Active member of club or organization: Not on file    Attends meetings of clubs or organizations: Not on file    Relationship status: Not on file  . Intimate partner violence:    Fear of current or ex partner: Not on file    Emotionally abused: Not on file    Physically abused: Not on file    Forced sexual activity: Not on file  Other Topics Concern  . Not on file  Social History Narrative   Married, 1 daughter. 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 Lives in [place], [place]    Family History  Problem Relation Age of Onset  . Coronary artery disease Unknown        family hx  . Breast cancer Unknown        1st egree relative <50    No Known Allergies  Medication list reviewed and updated in full in [place].  ROS: GEN: Acute illness details above GI: Tolerating PO intake GU: maintaining adequate hydration and urination Pulm: No SOB Interactive and getting along well at home.  Otherwise, ROS is as per the HPI.  Objective:   BP 110/60   Pulse 76   Temp 98.3 F (36.8 C) (Oral)   Ht _0  (1.803 m)   Wt 139 lb 8 oz (63.3 kg)   SpO2 93%   BMI 19.46 kg/m    GEN: A and O x 3. WDWN. NAD.   Notable weight loss ENT: Nose clear, ext NML.  No LAD.  No JVD.  TM's clear. Oropharynx clear.  PULM: Normal WOB, no distress. No crackles, wheezes, rhonchi. CV: RRR, no M/G/R, No rubs, No JVD.   EXT: warm and well-perfused, No c/c/e. PSYCH: Pleasant and conversant.    Laboratory and Imaging Data:  Assessment and Plan:   COPD with acute exacerbation ([place])  Acute bronchitis with COPD ([place])  Moderate protein-calorie malnutrition ([place])  Chronic respiratory failure with hypoxia, on home oxygen therapy ([place])  Given his overall state of health, I am going to place the patient on some oral steroids as well as some doxycycline.  I encouraged him to eat as much as he wants, he  is on Megace, and I told him he could eat high caloric foods such as sausage, bacon, or really anything that he would like at this point.  Follow-up: No follow-ups on file.  Meds ordered this encounter  Medications  . doxycycline (VIBRA-TABS) 100 MG tablet    Sig: Take 1 tablet (100 mg total) by mouth 2 (two) times daily for 10 days.    Dispense:  20 tablet    Refill:  0  . predniSONE (DELTASONE) 20 MG tablet    Sig: 2 tabs po for 5 days, then 1 tab po for 5 days    Dispense:  15 tablet    Refill:  0   There are no discontinued medications. No orders of the defined types were placed in this encounter.   Signed,  Maud Deed. Kyarah Enamorado, MD   Allergies as of 12/27/2017   No Known Allergies     Medication List        Accurate as of 12/27/17  1:49 PM. Always use your most recent med list.          acetaminophen 500 MG tablet Commonly known as:  TYLENOL Take 1,000 mg by mouth daily as needed for moderate pain or headache.   carvedilol 3.125 MG tablet Commonly known as:  COREG TAKE 1 TABLET (3.125 MG TOTAL) BY MOUTH 2 (TWO) TIMES DAILY.   doxycycline 100 MG tablet Commonly known as:  VIBRA-TABS Take 1 tablet (100 mg total) by mouth 2 (two) times daily for 10 days.   Fluticasone-Salmeterol 250-50 MCG/DOSE Aepb Commonly known as:  ADVAIR DISKUS Inhale 1 puff into the lungs 2 (two) times daily.   gabapentin 300 MG capsule Commonly known as:  NEURONTIN Take 1 capsule (300 mg total) by mouth 2 (two) times daily.   Glucosamine HCl 1000 MG Tabs Take 1,000 mg by mouth every evening.  GLUCOSAMINE RELIEF 1000 MG Tabs Generic drug:  Glucosamine Sulfate Take by mouth.   ipratropium-albuterol 0.5-2.5 (3) MG/3ML Soln Commonly known as:  DUONEB TAKE 3 MLS BY NEBULIZATION EVERY 4 (FOUR) HOURS AS NEEDED.   Magnesium 250 MG Tabs Take 250 mg by mouth every evening.   megestrol 40 MG tablet Commonly known as:  MEGACE Take 1 tablet (40 mg total) by mouth daily.   metFORMIN  500 MG 24 hr tablet Commonly known as:  GLUCOPHAGE-XR TAKE 1 TABLET (500 MG TOTAL) BY MOUTH DAILY WITH BREAKFAST.   omeprazole 20 MG capsule Commonly known as:  PRILOSEC Take 20 mg by mouth daily.   oxymetazoline 0.05 % nasal spray Commonly known as:  AFRIN Place 1 spray into both nostrils daily as needed for congestion.   predniSONE 20 MG tablet Commonly known as:  DELTASONE 2 tabs po for 5 days, then 1 tab po for 5 days   pseudoephedrine 30 MG tablet Commonly known as:  SUDAFED Take 30 mg by mouth daily as needed for congestion.   SYMBICORT 160-4.5 MCG/ACT inhaler Generic drug:  budesonide-formoterol INHALE 2 PUFFS INTO THE LUNGS 2 (TWO) TIMES DAILY.   tiotropium 18 MCG inhalation capsule Commonly known as:  SPIRIVA HANDIHALER Place 1 capsule (18 mcg total) into inhaler and inhale daily.   tiZANidine 4 MG tablet Commonly known as:  ZANAFLEX TAKE 1 TABLET (4 MG TOTAL) BY MOUTH NIGHTLY.   Vitamin D 2000 units Caps Take 2,000 Units by mouth every evening.

## 2018-01-13 ENCOUNTER — Other Ambulatory Visit: Payer: Self-pay | Admitting: *Deleted

## 2018-01-13 DIAGNOSIS — C9310 Chronic myelomonocytic leukemia not having achieved remission: Secondary | ICD-10-CM

## 2018-01-15 NOTE — Progress Notes (Signed)
Tony Watson  [phone]) [phone] Fax:(336) [phone]  ID: ROXAS CLYMER OB: September 14, 1950  MR#: [id number]  ACZ#:[id number]  Patient Care Team: Owens Loffler, MD as PCP - General Ola Spurr Cheral Marker, MD (Infectious Diseases) [name], MD as Consulting Physician (Pulmonary Disease)  CHIEF COMPLAINT: Bone marrow biopsy proven CMML-1.  INTERVAL HISTORY: Patient returns to clinic today for repeat laboratory work and routine 64-monthfollow-up.  He continues to have mild weight loss, but otherwise feels well. He continues to have persistent weakness and fatigue. He denies any fevers or night sweats. He has no neurologic complaints. He has no chest pain or shortness of breath. He denies any nausea, vomiting, constipation, or diarrhea.  He has no melena or hematochezia.  He has no urinary complaints.  Patient offers no further specific complaints today.    REVIEW OF SYSTEMS:   Review of Systems  Constitutional: Positive for malaise/fatigue and weight loss. Negative for diaphoresis and fever.  HENT: Negative.  Negative for sore throat.   Respiratory: Negative for cough and shortness of breath.   Cardiovascular: Negative.  Negative for chest pain and leg swelling.  Gastrointestinal: Negative.  Negative for abdominal pain, nausea and vomiting.  Genitourinary: Negative.  Negative for dysuria.  Musculoskeletal: Negative.  Negative for back pain.  Neurological: Positive for weakness. Negative for sensory change and focal weakness.  Endo/Heme/Allergies: Does not bruise/bleed easily.  Psychiatric/Behavioral: Negative.  The patient is not nervous/anxious.     As per HPI. Otherwise, a complete review of systems is negative.  PAST MEDICAL HISTORY: Past Medical History:  Diagnosis Date  . Arthritis   . Asthma   . Bronchitis   . CAD (coronary artery disease)    inferior [place] 11/99 ([place] [facility] [place]: EF 50%, patent RCA stent. no obstructive  disease. last myoview 2008: EF 42%, inferior infarct, no ischemia.   . Cancer ([facility]    CMML [place]  . CHF (congestive heart failure) ([facility] [place]   . COPD (chronic obstructive pulmonary disease) ([place]   . Cough    CHRONIC  . Diabetic autonomic neuropathy associated with type 2 diabetes mellitus ([place] 03/27/2015  . Diverticulosis 2002  . DM2 (diabetes mellitus, type 2) (HCC)    A1c 6.0% 12/11  . Dyspnea   . Edema   . GERD (gastroesophageal reflux disease)   . H/O wheezing   . HTN (hypertension)   . Hyperlipidemia   . Ischemic cardiomyopathy    mild with EF 42% on myoviews 2008.  . Medical non-compliance   . Myocardial infarction ([facility]   . Nicotine dependence    chronic, active  . Pneumonia    in the past  . Seasonal allergic rhinitis   . Sleep apnea   . Smoker   . Spinal stenosis   . 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 Systolic heart failure secondary to coronary artery disease ([place] 05/30/2012  . TIA (transient ischemic attack)    hx; now s/p PFO closure 2004 ([place]  . Ulcer     PAST SURGICAL HISTORY: Past Surgical History:  Procedure Laterality Date  . BACK SURGERY     patient denies-just lumbar punctures  . BRONCHOSCOPY    . CARDIAC CATHETERIZATION  11/99   CI per PM[facility]  . CATARACT EXTRACTION W/PHACO Right 11/24/2016   Procedure: CATARACT EXTRACTION PHACO AND INTRAOCULAR LENS PLACEMENT (IOC);  Surgeon: WBirder Robson MD;  Location: [facility] ORS;  Service: Ophthalmology;  Laterality: Right;  [place]  1:04.3 AP% 22.3 CDE 14.35 Fluid pack lot # [id number]  . CATARACT EXTRACTION W/PHACO Left 12/29/2016   Procedure: CATARACT EXTRACTION PHACO AND INTRAOCULAR LENS PLACEMENT (IOC);  Surgeon: Birder Robson, MD;  Location: [facility] ORS;  Service: Ophthalmology;  Laterality: Left;  [place] 00:52 AP% 18.5 CDE 9.67 Fluid pack lot # [id number]  . COLONOSCOPY    . COLONOSCOPY WITH PROPOFOL N/A 05/25/2017   Procedure: COLONOSCOPY WITH PROPOFOL;  Surgeon: Jonathon Bellows, MD;  Location: [facility]  ENDOSCOPY;  Service: Gastroenterology;  Laterality: N/A;  . CORONARY ANGIOPLASTY     STENT  . CORONARY ARTERY BYPASS GRAFT     stent  . ESOPHAGOGASTRODUODENOSCOPY (EGD) WITH PROPOFOL N/A 05/25/2017   Procedure: ESOPHAGOGASTRODUODENOSCOPY (EGD) WITH PROPOFOL;  Surgeon: Jonathon Bellows, MD;  Location: [facility] ENDOSCOPY;  Service: Gastroenterology;  Laterality: N/A;  . EYE SURGERY    . FLEXIBLE BRONCHOSCOPY N/A 05/19/2017   Procedure: FLEXIBLE BRONCHOSCOPY;  Surgeon: Wilhelmina Mcardle, MD;  Location: [facility] ORS;  Service: Pulmonary;  Laterality: N/A;  . open heart surgery  2004   PFO repair  . UPPER GI ENDOSCOPY      FAMILY HISTORY Family History  Problem Relation Age of Onset  . Coronary artery disease Unknown        family hx  . Breast cancer Unknown        1st egree relative <50       ADVANCED DIRECTIVES:    HEALTH MAINTENANCE: Social History   Tobacco Use  . Smoking status: Current Every Day Smoker    Packs/day: 0.50    Years: 45.00    Pack years: 22.50    Types: Cigarettes  . Smokeless tobacco: Never Used  . Tobacco comment: 1 ppd +40 years  Substance Use Topics  . Alcohol use: Yes    Alcohol/week: 0.0 oz    Comment: weekly but last dose 1 month  . Drug use: No     Colonoscopy:  PAP:  Bone density:  Lipid panel:  No Known Allergies  Current Outpatient Medications  Medication Sig Dispense Refill  . acetaminophen (TYLENOL) 500 MG tablet Take 1,000 mg by mouth daily as needed for moderate pain or headache.    . carvedilol (COREG) 3.125 MG tablet TAKE 1 TABLET (3.125 MG TOTAL) BY MOUTH 2 (TWO) TIMES DAILY. 180 tablet 0  . Cholecalciferol (VITAMIN D) 2000 units CAPS Take 2,000 Units by mouth every evening.    . Fluticasone-Salmeterol (ADVAIR DISKUS) 250-50 MCG/DOSE AEPB Inhale 1 puff into the lungs 2 (two) times daily. 1 each 12  . gabapentin (NEURONTIN) 300 MG capsule Take 1 capsule (300 mg total) by mouth 2 (two) times daily. (Patient taking differently: Take 300 mg by  mouth at bedtime. ) 180 capsule 3  . Glucosamine HCl 1000 MG TABS Take 1,000 mg by mouth every evening.    . Glucosamine Sulfate (GLUCOSAMINE RELIEF) 1000 MG TABS Take by mouth.    Marland Kitchen ipratropium-albuterol (DUONEB) 0.5-2.5 (3) MG/3ML SOLN TAKE 3 MLS BY NEBULIZATION EVERY 4 (FOUR) HOURS AS NEEDED. 630 mL 5  . Magnesium 250 MG TABS Take 250 mg by mouth every evening.    . megestrol (MEGACE) 40 MG tablet Take 1 tablet (40 mg total) by mouth daily. 30 tablet 2  . metFORMIN (GLUCOPHAGE-XR) 500 MG 24 hr tablet TAKE 1 TABLET (500 MG TOTAL) BY MOUTH DAILY WITH BREAKFAST. 90 tablet 0  . omeprazole (PRILOSEC) 20 MG capsule Take 20 mg by mouth daily.    Marland Kitchen oxymetazoline (AFRIN) 0.05 % nasal spray  Place 1 spray into both nostrils daily as needed for congestion.    . predniSONE (DELTASONE) 20 MG tablet 2 tabs po for 5 days, then 1 tab po for 5 days 15 tablet 0  . pseudoephedrine (SUDAFED) 30 MG tablet Take 30 mg by mouth daily as needed for congestion.     . SYMBICORT 160-4.5 MCG/ACT inhaler INHALE 2 PUFFS INTO THE LUNGS 2 (TWO) TIMES DAILY. 10.2 Inhaler 4  . tiotropium (SPIRIVA HANDIHALER) 18 MCG inhalation capsule Place 1 capsule (18 mcg total) into inhaler and inhale daily. 30 capsule 2  . tiZANidine (ZANAFLEX) 4 MG tablet TAKE 1 TABLET (4 MG TOTAL) BY MOUTH NIGHTLY. 90 tablet 3   No current facility-administered medications for this visit.     OBJECTIVE: Vitals:   01/17/18 1504 01/17/18 1507  BP:  123/75  Pulse:  (!) 106  Resp: 16   Temp:  98.1 F (36.7 C)     Body mass index is 19.02 kg/m.    ECOG FS:0 - Asymptomatic  General: Thin, no acute distress. Eyes: Pink conjunctiva, anicteric sclera. HEENT: Normocephalic, moist mucous membranes, clear oropharnyx. Lungs: Clear to auscultation bilaterally. Heart: Regular rate and rhythm. No rubs, murmurs, or gallops. Abdomen: Soft, nontender, nondistended. No organomegaly noted, normoactive bowel sounds. Musculoskeletal: No edema, cyanosis, or  clubbing. Neuro: Alert, answering all questions appropriately. Cranial nerves grossly intact. Skin: No rashes or petechiae noted. Psych: Normal affect.  LAB RESULTS:  Lab Results  Component Value Date   NA 132 (L) 06/14/2017   K 4.0 06/14/2017   CL 96 (L) 06/14/2017   CO2 26 06/14/2017   GLUCOSE 103 (H) 06/14/2017   BUN 13 06/14/2017   CREATININE 0.65 06/14/2017   CALCIUM 9.1 06/14/2017   PROT 9.4 (H) 06/14/2017   ALBUMIN 3.1 (L) 06/14/2017   AST 29 06/14/2017   ALT 25 06/14/2017   ALKPHOS 83 06/14/2017   BILITOT 0.5 06/14/2017   GFRNONAA >60 06/14/2017   GFRAA >60 06/14/2017    Lab Results  Component Value Date   WBC 8.7 01/17/2018   NEUTROABS 2.6 01/17/2018   HGB 11.7 (L) 01/17/2018   HCT 33.7 (L) 01/17/2018   MCV 96.5 01/17/2018   PLT 174 01/17/2018     STUDIES: No results found.  ASSESSMENT: Bone marrow biopsy proven CMML-1.  PLAN:    1. CMML: Bone marrow biopsy completed on August 30, 2015 confirmed the diagnosis. Repeat bone marrow biopsy on March 08, 2017 is essentially unchanged. Patient's absolute monocytosis has been greater than 1.0 and persistent since at least July 2014.  More recently it has trended up and is now 4.2.  The remainder of his blood work including BCR-ABL and ANA are negative or within normal limits. Previously, hepatitis C and HIV were also negative.  He possibly will need treatment with azacitidine or decitabine in the future, but not at this time.  He continues to have no evidence of end organ involvement. CT scans from February 24, 2017 revealed stable lung nodules.  Return to clinic in 3 months with repeat laboratory work and further evaluation.   2. Pulmonary nodules: Most recent CT scan revealed unchanged.  Continue follow-up with pulmonology as indicated. 3. Weight loss: CT scans and bone marrow biopsy as above. Thyroid panel within normal limits. Encouraged supplements such as Ensure and Boost. Continue Megace 40 mg daily.  Patient has  been evaluated by pulmonology with bronchoscopy, GI with endoscopies, and infectious disease without any obvious etiology for his weight loss.  Appreciate  dietary input.   Approximately 20 minutes was spent in discussion of which greater than 50% was consultation.   Patient expressed understanding and was in agreement with this plan. He also understands that He can call clinic at any time with any questions, concerns, or complaints.    Lloyd Huger, MD   01/20/2018 12:25 AM

## 2018-01-17 ENCOUNTER — Other Ambulatory Visit: Payer: Self-pay

## 2018-01-17 ENCOUNTER — Inpatient Hospital Stay: Payer: Medicare HMO | Attending: Oncology

## 2018-01-17 ENCOUNTER — Inpatient Hospital Stay: Payer: Medicare HMO

## 2018-01-17 ENCOUNTER — Encounter: Payer: Self-pay | Admitting: Oncology

## 2018-01-17 ENCOUNTER — Inpatient Hospital Stay (HOSPITAL_BASED_OUTPATIENT_CLINIC_OR_DEPARTMENT_OTHER): Payer: Medicare HMO | Admitting: Oncology

## 2018-01-17 VITALS — BP 123/75 | HR 106 | Temp 98.1°F | Resp 16 | Ht 71.0 in | Wt 136.4 lb

## 2018-01-17 DIAGNOSIS — G4730 Sleep apnea, unspecified: Secondary | ICD-10-CM | POA: Insufficient documentation

## 2018-01-17 DIAGNOSIS — I2510 Atherosclerotic heart disease of native coronary artery without angina pectoris: Secondary | ICD-10-CM | POA: Diagnosis not present

## 2018-01-17 DIAGNOSIS — Z8673 Personal history of transient ischemic attack (TIA), and cerebral infarction without residual deficits: Secondary | ICD-10-CM | POA: Diagnosis not present

## 2018-01-17 DIAGNOSIS — J449 Chronic obstructive pulmonary disease, unspecified: Secondary | ICD-10-CM | POA: Insufficient documentation

## 2018-01-17 DIAGNOSIS — Z955 Presence of coronary angioplasty implant and graft: Secondary | ICD-10-CM | POA: Insufficient documentation

## 2018-01-17 DIAGNOSIS — I255 Ischemic cardiomyopathy: Secondary | ICD-10-CM | POA: Insufficient documentation

## 2018-01-17 DIAGNOSIS — I252 Old myocardial infarction: Secondary | ICD-10-CM | POA: Insufficient documentation

## 2018-01-17 DIAGNOSIS — R634 Abnormal weight loss: Secondary | ICD-10-CM | POA: Diagnosis not present

## 2018-01-17 DIAGNOSIS — Z79899 Other long term (current) drug therapy: Secondary | ICD-10-CM | POA: Insufficient documentation

## 2018-01-17 DIAGNOSIS — Z7984 Long term (current) use of oral hypoglycemic drugs: Secondary | ICD-10-CM | POA: Insufficient documentation

## 2018-01-17 DIAGNOSIS — Z951 Presence of aortocoronary bypass graft: Secondary | ICD-10-CM | POA: Insufficient documentation

## 2018-01-17 DIAGNOSIS — C9310 Chronic myelomonocytic leukemia not having achieved remission: Secondary | ICD-10-CM | POA: Insufficient documentation

## 2018-01-17 DIAGNOSIS — F17210 Nicotine dependence, cigarettes, uncomplicated: Secondary | ICD-10-CM | POA: Insufficient documentation

## 2018-01-17 DIAGNOSIS — I110 Hypertensive heart disease with heart failure: Secondary | ICD-10-CM | POA: Insufficient documentation

## 2018-01-17 DIAGNOSIS — E785 Hyperlipidemia, unspecified: Secondary | ICD-10-CM | POA: Diagnosis not present

## 2018-01-17 DIAGNOSIS — E119 Type 2 diabetes mellitus without complications: Secondary | ICD-10-CM | POA: Diagnosis not present

## 2018-01-17 DIAGNOSIS — K219 Gastro-esophageal reflux disease without esophagitis: Secondary | ICD-10-CM | POA: Insufficient documentation

## 2018-01-17 LAB — CBC WITH DIFFERENTIAL/PLATELET
BASOS PCT: 1 %
Basophils Absolute: 0.1 10*3/uL (ref 0–0.1)
EOS ABS: 0.1 10*3/uL (ref 0–0.7)
EOS PCT: 1 %
HCT: 33.7 % — ABNORMAL LOW (ref 40.0–52.0)
Hemoglobin: 11.7 g/dL — ABNORMAL LOW (ref 13.0–18.0)
Lymphocytes Relative: 19 %
Lymphs Abs: 1.7 10*3/uL (ref 1.0–3.6)
MCH: 33.4 pg (ref 26.0–34.0)
MCHC: 34.6 g/dL (ref 32.0–36.0)
MCV: 96.5 fL (ref 80.0–100.0)
MONO ABS: 4.2 10*3/uL — AB (ref 0.2–1.0)
Monocytes Relative: 49 %
Neutro Abs: 2.6 10*3/uL (ref 1.4–6.5)
Neutrophils Relative %: 30 %
PLATELETS: 174 10*3/uL (ref 150–440)
RBC: 3.5 MIL/uL — ABNORMAL LOW (ref 4.40–5.90)
RDW: 15.9 % — ABNORMAL HIGH (ref 11.5–14.5)
WBC: 8.7 10*3/uL (ref 3.8–10.6)

## 2018-01-17 NOTE — Progress Notes (Signed)
Nutrition Follow-up:  Patient with CMML but not currently under treatment.    Met with patient following MD visit.  Patient reports appetite might be a bit better.  Reports that he eats oatmeal with lots of butter and sugar for breakfast. Lunch is usually doughnut type food item that wife prepares or ham sandwich (100%) or soup (campbell's tomato with whole milk added). Dinner is meat and vegetables.  Drinks ensure/boost with breakfast every morning.    Reports sore throat is better after being on predinsone and antibiotics but since being off sore throat is coming back. Reports does not feel like it limits intake  Medications: megace '40mg'$  tablet  Labs: reviewed  Anthropometrics:   Weight 136 lb 6.4 oz today in clinic and last cancer center weight 135 lb.    NUTRITION DIAGNOSIS: Severe malnutrition continues   MALNUTRITION DIAGNOSIS: severe malnutrition continues   INTERVENTION:  Reviewed foods high in calories and protein and strategies to help with increasing calories and protein Would recommend dose adjustment in megace to stimulate appetite. If patient wants aggressive nutrition therapy and continues to loose weight would recommend considering feeding tube placement.   Patient given 1st complimentary case of ensure enlive today.  Patient feels like he can increase shake to 2-3 times per day.      MONITORING, EVALUATION, GOAL: po intake, weight trends   NEXT VISIT: phone follow-up prior to clinic visit.  RD not in clinic on Tuesday, next appointment day.  Woodson Macha B. Zenia Resides, [place], [place] Registered Dietitian [phone] (pager)

## 2018-01-17 NOTE — Progress Notes (Signed)
Patient here for follow up. He reports no changes since his last appointment. 

## 2018-01-19 LAB — COMP PANEL: LEUKEMIA/LYMPHOMA

## 2018-02-01 ENCOUNTER — Other Ambulatory Visit: Payer: Self-pay | Admitting: Internal Medicine

## 2018-02-23 ENCOUNTER — Ambulatory Visit: Payer: Medicare HMO | Admitting: Internal Medicine

## 2018-02-24 ENCOUNTER — Encounter: Payer: Self-pay | Admitting: Internal Medicine

## 2018-02-24 ENCOUNTER — Ambulatory Visit: Payer: Medicare HMO | Admitting: Internal Medicine

## 2018-02-24 VITALS — BP 104/60 | HR 111 | Ht 71.0 in | Wt 138.0 lb

## 2018-02-24 DIAGNOSIS — J849 Interstitial pulmonary disease, unspecified: Secondary | ICD-10-CM

## 2018-02-24 DIAGNOSIS — R0602 Shortness of breath: Secondary | ICD-10-CM | POA: Diagnosis not present

## 2018-02-24 DIAGNOSIS — J449 Chronic obstructive pulmonary disease, unspecified: Secondary | ICD-10-CM

## 2018-02-24 MED ORDER — FORMOTEROL FUMARATE 20 MCG/2ML IN NEBU: 20.0000 ug | INHALATION_SOLUTION | Freq: Two times a day (BID) | RESPIRATORY_TRACT | 12 refills | Status: DC

## 2018-02-24 MED ORDER — BUDESONIDE 0.5 MG/2ML IN SUSP: 0.5000 mg | Freq: Two times a day (BID) | RESPIRATORY_TRACT | 0 refills | Status: DC

## 2018-02-24 NOTE — Progress Notes (Signed)
Name: Tony Watson MRN: [id number] DOB: 04/14/1951     CONSULTATION DATE: 02/24/2018   REFERRING MD : Grayland Ormond  CHIEF COMPLAINT: fatigue   STUDIES:  CT chest 02/2017 I have Independently reviewed images of  CT chest  Interpretation: Bilateral multifocal micronodular densities and opacifications noted   Patient underwent diagnostic bronchoscopy  05/19/17 Patient's pathology and diagnosis is consistent with respiratory bronchiolitis No evidence of malignancy final diagnosis is consistent with pigmented macrophages consistent with respiratory bronchiolitis All infectious disease studies have been negative  Pathology reports discussed with patient   PREVIOUS OV Patient underwent diagnostic bronchoscopy last week and shown to have respiratory bronchiolitis which is heavily consistent with smoking   CC follow up COPD  HPI At this time is no signs of infection Does no signs of acute heart failure at this time Has progressive worsening of SOB  Patient also had abnormal overnight pulse oximetry and now is on 2 L nasal cannula at nighttime Patient had decreased o2 sats with ambulation ion our office today with increased WOB  pulmonary function testing on 05/13/17 FEV1 is 2.3 L which is 65% predicted FVC is 3.4 L which is 76% predicted Exline ratio is 68 TLC is 14 L which is 208% predicted RV is 10.8 L which is 42% predicted DLCO is abnormal for volumes adjusted Patient has moderate to severe obstructive pulmonary disease along with hyperinflation and air trapping with abnormal diffusion capacity    REVIEW OF SYSTEMS:   Constitutional: Negative for fever, chills, + weight loss, + malaise/fatigue and + diaphoresis.  HENT: Negative for hearing loss, ear pain, nosebleeds, congestion, sore throat, neck pain, tinnitus and ear discharge.   Eyes: Negative for blurred vision, double vision, photophobia, pain, discharge and redness.  Respiratory: Negative for cough, hemoptysis,  sputum production, +shortness of breath,+ wheezing and stridor.   Cardiovascular: Negative for chest pain, palpitations, orthopnea, claudication, leg swelling and PND.  Gastrointestinal: Negative for heartburn, nausea, vomiting, abdominal pain, diarrhea, constipation, blood in stool and melena.  Genitourinary: Negative for dysuria, urgency, frequency, hematuria and flank pain.  Musculoskeletal: Negative for myalgias, back pain, joint pain and falls.  ALL OTHER ROS ARE NEGATIVE     VITAL SIGNS: BP 104/60 (BP Location: Left Arm, Cuff Size: Normal)   Pulse (!) 111   Ht 5\' 11"  (1.803 m)   Wt 138 lb (62.6 kg)   SpO2 95%   BMI 19.25 kg/m   Physical Examination:   GENERAL:NAD, no fevers, chills, +thin +cachexia HEAD: Normocephalic, atraumatic.  EYES: Pupils equal, round, reactive to light. Extraocular muscles intact. No scleral icterus.  MOUTH: Moist mucosal membrane.   EAR, NOSE, THROAT: Clear without exudates. No external lesions.  NECK: Supple. No thyromegaly. No nodules. No JVD.  PULMONARY:CTA B/L no wheezes, no crackles, no rhonchi CARDIOVASCULAR: S1 and S2. Regular rate and rhythm. No murmurs, rubs, or gallops. No edema.  GASTROINTESTINAL: Soft, nontender, nondistended. No masses. Positive bowel sounds.  SKIN: No ulceration, lesions, rashes, or cyanosis. Skin warm and dry. Turgor intact.  PSYCHIATRIC: Mood, affect within normal limits. The patient is awake, alert and oriented x 3. Insight, judgment intact.       ASSESSMENT / PLAN: 67 y.o. 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 Patient now with chronic hypoxic respiratory failure on 2  L nasal cannula needs oxygen with exertyion as well and has progressive worsening SOB and DOE  #1 Pathological results confirm respiratory bronchiolitis with pigmented  macrophages on the diagnostic bronchoscopy which was performed on 05/19/17  Patient states that he stopped smoking 1 month ago I will not start prednisone at this time  #2 moderate to severe COPD Gold stage C Patient can NOT take inhalers anymore due to resp insufficiency Will start NEB therapy with PULMICORT AND PERFOROMIST  #3 chronic hypoxic respiratory failure Continue oxygen as prescribed Patient uses and benefits from oxygen Needs o2 with exertion and at night  #4Follow-up with oncology   #5. Deconditioned state Recommend exercise as tolerated   #6 Progressive SOB and h/o CAD Obtain ECHO  Patient satisfied with Plan of action and management. All questions answered Follow-up in 1 month  Prognosis is poor  Corrin Parker, M.D.  [name]  Medical Director [place] Director [facility] Cardio-Pulmonary Department

## 2018-02-24 NOTE — Patient Instructions (Addendum)
STOP SPIRIVA STOP SYMBICORT  START PULMICORT NEBS START PERFOROMIST NEBS  START OXYGEN WITH EXERTION CHECK ECHOCARDIOGRAM

## 2018-02-27 ENCOUNTER — Other Ambulatory Visit: Payer: Self-pay

## 2018-02-27 ENCOUNTER — Emergency Department: Payer: Medicare HMO

## 2018-02-27 ENCOUNTER — Encounter: Payer: Self-pay | Admitting: Emergency Medicine

## 2018-02-27 ENCOUNTER — Emergency Department
Admission: EM | Admit: 2018-02-27 | Discharge: 2018-02-28 | Disposition: A | Discharge status: Home / Self Care | Payer: Medicare HMO | Source: Home / Self Care | Attending: Emergency Medicine | Admitting: Emergency Medicine

## 2018-02-27 DIAGNOSIS — R05 Cough: Secondary | ICD-10-CM | POA: Insufficient documentation

## 2018-02-27 DIAGNOSIS — E1140 Type 2 diabetes mellitus with diabetic neuropathy, unspecified: Secondary | ICD-10-CM

## 2018-02-27 DIAGNOSIS — I252 Old myocardial infarction: Secondary | ICD-10-CM

## 2018-02-27 DIAGNOSIS — Z856 Personal history of leukemia: Secondary | ICD-10-CM

## 2018-02-27 DIAGNOSIS — R0600 Dyspnea, unspecified: Secondary | ICD-10-CM

## 2018-02-27 DIAGNOSIS — Z7984 Long term (current) use of oral hypoglycemic drugs: Secondary | ICD-10-CM | POA: Insufficient documentation

## 2018-02-27 DIAGNOSIS — Z87891 Personal history of nicotine dependence: Secondary | ICD-10-CM

## 2018-02-27 DIAGNOSIS — I5020 Unspecified systolic (congestive) heart failure: Secondary | ICD-10-CM

## 2018-02-27 DIAGNOSIS — Z79899 Other long term (current) drug therapy: Secondary | ICD-10-CM | POA: Insufficient documentation

## 2018-02-27 DIAGNOSIS — J441 Chronic obstructive pulmonary disease with (acute) exacerbation: Secondary | ICD-10-CM

## 2018-02-27 DIAGNOSIS — I110 Hypertensive heart disease with heart failure: Secondary | ICD-10-CM

## 2018-02-27 DIAGNOSIS — Z951 Presence of aortocoronary bypass graft: Secondary | ICD-10-CM | POA: Insufficient documentation

## 2018-02-27 DIAGNOSIS — Z9981 Dependence on supplemental oxygen: Secondary | ICD-10-CM

## 2018-02-27 LAB — BASIC METABOLIC PANEL
ANION GAP: 11 (ref 5–15)
BUN: 12 mg/dL (ref 8–23)
CO2: 27 mmol/L (ref 22–32)
Calcium: 9.4 mg/dL (ref 8.9–10.3)
Chloride: 99 mmol/L (ref 98–111)
Creatinine, Ser: 0.62 mg/dL (ref 0.61–1.24)
GFR calc Af Amer: >60 mL/min (ref >60)
Glucose, Bld: 126 mg/dL — ABNORMAL HIGH (ref 70–99)
POTASSIUM: 4 mmol/L (ref 3.5–5.1)
Sodium: 137 mmol/L (ref 135–145)

## 2018-02-27 LAB — CBC
HEMATOCRIT: 33 % — AB (ref 40.0–52.0)
Hemoglobin: 11.4 g/dL — ABNORMAL LOW (ref 13.0–18.0)
MCH: 33 pg (ref 26.0–34.0)
MCHC: 34.4 g/dL (ref 32.0–36.0)
MCV: 96 fL (ref 80.0–100.0)
Platelets: 240 10*3/uL (ref 150–440)
RBC: 3.44 MIL/uL — ABNORMAL LOW (ref 4.40–5.90)
RDW: 16 % — AB (ref 11.5–14.5)
WBC: 8.1 10*3/uL (ref 3.8–10.6)

## 2018-02-27 LAB — TROPONIN I

## 2018-02-27 MED ORDER — IPRATROPIUM-ALBUTEROL 0.5-2.5 (3) MG/3ML IN SOLN
3.0000 mL | Freq: Once | RESPIRATORY_TRACT | Status: AC
  Administered 2018-02-27: 3 mL via RESPIRATORY_TRACT
  Filled 2018-02-27: qty 3

## 2018-02-27 MED ORDER — METHYLPREDNISOLONE SODIUM SUCC 125 MG IJ SOLR
125.0000 mg | Freq: Once | INTRAMUSCULAR | Status: AC
  Administered 2018-02-27: 125 mg via INTRAVENOUS
  Filled 2018-02-27: qty 2

## 2018-02-27 MED ORDER — AZITHROMYCIN 250 MG PO TABS: 250.0000 mg | ORAL_TABLET | Freq: Every day | ORAL | 0 refills | Status: DC

## 2018-02-27 MED ORDER — AZITHROMYCIN 500 MG PO TABS
500.0000 mg | ORAL_TABLET | Freq: Once | ORAL | Status: AC
  Administered 2018-02-27: 500 mg via ORAL
  Filled 2018-02-27: qty 1

## 2018-02-27 MED ORDER — PREDNISONE 20 MG PO TABS: 40.0000 mg | ORAL_TABLET | Freq: Every day | ORAL | 0 refills | Status: DC

## 2018-02-27 NOTE — ED Provider Notes (Signed)
[facility] Emergency Department Provider Note  Time seen: 10:50 PM  I have reviewed the triage vital signs and the nursing notes.   HISTORY  Chief Complaint Shortness of Breath    HPI Tony Watson is a 67 y.o. male with a past medical history of asthma, bronchitis, CAD, COPD, CHF, diabetes, hypertension, hyperlipidemia, MI, presents to the emergency department for shortness of breath.  According to the patient for the past 4 weeks he has been experiencing worsening shortness of breath.  States he has chronic COPD is on 2 L of oxygen 24/7.  States he has seen his pulmonologist Dr. Mortimer Fries several times recently and has had multiple medication changes but he states nothing seems to help.  States he has not been on any antibiotics or prednisone for at least 2 to 3 months.  Denies any fever.  States he does have a cough and occasionally will get sputum production but states this is fairly chronic for him as well.  Denies any chest pain.  Does continue to feel tightness in the chest.   Past Medical History:  Diagnosis Date  . Arthritis   . Asthma   . Bronchitis   . CAD (coronary artery disease)    inferior MI 11/99 (in [place]) with PCI to [facility]. last [place] 2004 [facility]): EF 50%, patent RCA stent. no obstructive disease. last myoview 2008: EF 42%, inferior infarct, no ischemia.   . Cancer ([place])    CMML [place]  . CHF (congestive heart failure) ([place])   . COPD (chronic obstructive pulmonary disease) ([place])   . Cough    CHRONIC  . Diabetic autonomic neuropathy associated with type 2 diabetes mellitus ([place]) 03/27/2015  . Diverticulosis 2002  . DM2 (diabetes mellitus, type 2) (H[facility])    A1c 6.0% 12/11  . Dyspnea   . Edema   . GERD (gastroesophageal reflux disease)   . H/O wheezing   . HTN (hypertension)   . Hyperlipidemia   . Ischemic cardiomyopathy    mild with EF 42% on myoviews 2008.  . Medical non-compliance   . Myocardial infarction ([place])   . Nicotine dependence     chronic, active  . Pneumonia    in the past  . Seasonal allergic rhinitis   . Sleep apnea   . Smoker   . Spinal stenosis   . 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 TIA (transient ischemic attack)    hx; now s/p PFO closure 2004 (in [place])  . Ulcer     Patient Active Problem List   Diagnosis Date Noted  . 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 Diabetic autonomic neuropathy associated with type 2 diabetes mellitus ([place]) 03/27/2015  . History of inferior MI (myocardial infarction) 10/14/2013  . CAD (coronary artery disease)   . HTN (hypertension)   . History of noncompliance with medical treatment 05/30/2012  . 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 COPD, severe 03/23/2011  . Other emphysema ([place]) 03/23/2011  . ALLERGIC RHINITIS CAUSE UNSPECIFIED 07/31/2010  . Type 2 diabetes mellitus with vascular disease ([place]) 01/02/2010  . TOBACCO USE 04/17/2009  . TRANSIENT ISCHEMIC ATTACKS, HX OF 04/17/2009  . Hyperlipidemia 12/19/2008  . HYPERTENSION, UNSPECIFIED 12/19/2008    Past Surgical History:  Procedure Laterality Date  . BACK SURGERY     patient denies-just lumbar punctures  . BRONCHOSCOPY    .  CARDIAC CATHETERIZATION  11/99   CI per [facility]  . CATARACT EXTRACTION W/PHACO Right 11/24/2016   Procedure: CATARACT EXTRACTION PHACO AND INTRAOCULAR LENS PLACEMENT (IOC);  Surgeon: Birder Robson, MD;  Location: [facility] ORS;  Service: Ophthalmology;  Laterality: Right;  [place] 1:04.3 AP% 22.3 CDE 14.35 Fluid pack lot # [id number]  . CATARACT EXTRACTION W/PHACO Left 12/29/2016   Procedure: CATARACT EXTRACTION PHACO AND INTRAOCULAR LENS PLACEMENT (IOC);  Surgeon: Birder Robson, MD;  Location: [facility] ORS;  Service: Ophthalmology;  Laterality: Left;  [place] 00:52 AP% 18.5 CDE 9.67 Fluid pack lot # [id number]  . COLONOSCOPY    .  COLONOSCOPY WITH PROPOFOL N/A 05/25/2017   Procedure: COLONOSCOPY WITH PROPOFOL;  Surgeon: Jonathon Bellows, MD;  Location: [facility] ENDOSCOPY;  Service: Gastroenterology;  Laterality: N/A;  . CORONARY ANGIOPLASTY     STENT  . CORONARY ARTERY BYPASS GRAFT     stent  . ESOPHAGOGASTRODUODENOSCOPY (EGD) WITH PROPOFOL N/A 05/25/2017   Procedure: ESOPHAGOGASTRODUODENOSCOPY (EGD) WITH PROPOFOL;  Surgeon: Jonathon Bellows, MD;  Location: [facility] ENDOSCOPY;  Service: Gastroenterology;  Laterality: N/A;  . EYE SURGERY    . FLEXIBLE BRONCHOSCOPY N/A 05/19/2017   Procedure: FLEXIBLE BRONCHOSCOPY;  Surgeon: Wilhelmina Mcardle, MD;  Location: [facility] ORS;  Service: Pulmonary;  Laterality: N/A;  . open heart surgery  2004   PFO repair  . UPPER GI ENDOSCOPY      Prior to Admission medications   Medication Sig Start Date End Date Taking? Authorizing Provider  acetaminophen (TYLENOL) 500 MG tablet Take 1,000 mg by mouth daily as needed for moderate pain or headache.    [provider]  budesonide (PULMICORT) 0.5 MG/2ML nebulizer solution Take 2 mLs (0.5 mg total) by nebulization 2 (two) times daily. 02/24/18 02/24/19  [name], MD  carvedilol (COREG) 3.125 MG tablet TAKE 1 TABLET (3.125 MG TOTAL) BY MOUTH 2 (TWO) TIMES DAILY. 03/19/17   Copland, Frederico Hamman, MD  Cholecalciferol (VITAMIN D) 2000 units CAPS Take 2,000 Units by mouth every evening.    [provider]  formoterol (PERFOROMIST) 20 MCG/2ML nebulizer solution Take 2 mLs (20 mcg total) by nebulization 2 (two) times daily. 02/24/18   [name], MD  gabapentin (NEURONTIN) 300 MG capsule Take 1 capsule (300 mg total) by mouth 2 (two) times daily. Patient taking differently: Take 300 mg by mouth at bedtime.  07/22/16   Copland, Frederico Hamman, MD  Glucosamine HCl 1000 MG TABS Take 1,000 mg by mouth every evening.    [provider]  Glucosamine Sulfate (GLUCOSAMINE RELIEF) 1000 MG TABS Take by mouth.    [provider]  ipratropium-albuterol  (DUONEB) 0.5-2.5 (3) MG/3ML SOLN TAKE 3 MLS BY NEBULIZATION EVERY 4 (FOUR) HOURS AS NEEDED. 10/27/17   [name], MD  Magnesium 250 MG TABS Take 250 mg by mouth every evening.    [provider]  megestrol (MEGACE) 40 MG tablet Take 1 tablet (40 mg total) by mouth daily. 04/26/17   Lloyd Huger, MD  metFORMIN (GLUCOPHAGE-XR) 500 MG 24 hr tablet TAKE 1 TABLET (500 MG TOTAL) BY MOUTH DAILY WITH BREAKFAST. 03/19/17   Copland, Frederico Hamman, MD  omeprazole (PRILOSEC) 20 MG capsule Take 20 mg by mouth daily.    [provider]  oxymetazoline (AFRIN) 0.05 % nasal spray Place 1 spray into both nostrils daily as needed for congestion.    [provider]  pseudoephedrine (SUDAFED) 30 MG tablet Take 30 mg by mouth daily as needed for congestion.     [provider]  [facility] Michaell Cowing  18 MCG inhalation capsule INHALE 1 CAPSULE VIA HANDIHALER ONCE DAILY AT THE SAME TIME EVERY DAY 02/01/18   [name], MD  SYMBICORT 160-4.5 MCG/ACT inhaler INHALE 2 PUFFS INTO THE LUNGS 2 (TWO) TIMES DAILY. 09/21/16   Copland, Frederico Hamman, MD  tiZANidine (ZANAFLEX) 4 MG tablet TAKE 1 TABLET (4 MG TOTAL) BY MOUTH NIGHTLY. 03/21/17   Copland, Frederico Hamman, MD    No Known Allergies  Family History  Problem Relation Age of Onset  . Coronary artery disease Unknown        family hx  . Breast cancer Unknown        1st egree relative <50    Social History Social History   Tobacco Use  . Smoking status: Former Smoker    Packs/day: 0.50    Years: 45.00    Pack years: 22.50    Types: Cigarettes    Last attempt to quit: 01/25/2018    Years since quitting: 0.0  . Smokeless tobacco: Never Used  . Tobacco comment: 1 ppd +40 years  Substance Use Topics  . Alcohol use: Yes    Alcohol/week: 0.0 oz    Comment: weekly but last dose 1 month  . Drug use: No    Review of Systems Constitutional: Negative for fever. Eyes: Negative for visual complaints ENT: Negative for recent  illness/congestion Cardiovascular: Positive for mild chest tightness Respiratory: Positive for shortness of breath worsening over the past 4 to 5 weeks. occasional cough and congestion but chronic. Gastrointestinal: Negative for abdominal pain, vomiting  Genitourinary: Negative for urinary compaints Musculoskeletal: Negative for leg pain or swelling Skin: Negative for skin complaints  Neurological: Negative for headache All other ROS negative  ____________________________________________   PHYSICAL EXAM:  VITAL SIGNS: ED Triage Vitals  Enc Vitals Group     BP 02/27/18 2118 104/81     Pulse Rate 02/27/18 2118 (!) 115     Resp 02/27/18 2118 20     Temp 02/27/18 2118 98.1 F (36.7 C)     Temp src --      SpO2 02/27/18 2118 97 %     Weight 02/27/18 2119 138 lb (62.6 kg)     Height 02/27/18 2119 _0  (1.803 m)     Head Circumference --      Peak Flow --      Pain Score 02/27/18 2118 0     Pain Loc --      Pain Edu? --      Excl. in [place]? --    Constitutional: Alert and oriented. Well appearing and in no distress. Eyes: Normal exam ENT   Head: Normocephalic and atraumatic.   Mouth/Throat: Mucous membranes are moist. Cardiovascular: Normal rate, regular rhythm. No murmur Respiratory: Mild tachypnea around 20 to 22 breaths/min, mild expiratory wheeze bilaterally left somewhat greater than right.  No obvious rales or rhonchi. Gastrointestinal: Soft and nontender. No distention.  Musculoskeletal: Nontender with normal range of motion in all extremities. No lower extremity tenderness or edema. Neurologic:  Normal speech and language. No gross focal neurologic deficits  Skin:  Skin is warm, dry and intact.  Psychiatric: Mood and affect are normal.   ____________________________________________    EKG  EKG reviewed and interpreted by myself shows sinus tachycardia 115 bpm with a widened QRS, normal axis, largely normal intervals nonspecific ST changes.  EKG morphology  largely unchanged from prior in 2016.  ____________________________________________    RADIOLOGY  Chest x-ray shows no acute abnormality  ____________________________________________   INITIAL IMPRESSION /  ASSESSMENT AND PLAN / ED COURSE  Pertinent labs & imaging results that were available during my care of the patient were reviewed by me and considered in my medical decision making (see chart for details).  Patient presents the emergency department for shortness of breath ongoing for the past for 5 weeks but getting worse despite medication changes by his pulmonologist.  Patient felt like he could not breathe tonight so he came to the emergency department.  Here the patient appears well, slightly tachypneic around 20 breaths/min with slight expiratory wheeze bilaterally.  Patient is satting 97 to 100% on 2 L of oxygen which is his baseline requirement.  No acute distress.  Patient's labs are reassuring including a negative troponin, chest x-ray shows chronic process but no acute changes.  We will dose prednisone, dose Zithromax and duo nebs and reassess.  Patient states he is feeling much better after breathing treatments and steroids.  We will discharge for 4 additional days of Zithromax as well as 5 days of steroids.  Patient agreeable plan of care and will follow up with his pulmonologist.  ____________________________________________   FINAL CLINICAL IMPRESSION(S) / ED DIAGNOSES  Dyspnea COPD exacerbation    Harvest Dark, MD 02/27/18 2352

## 2018-02-27 NOTE — ED Triage Notes (Addendum)
Pt comes into the ED via POV c/o increased shortness of breath.  Patient states that he has been back and forth to his doctor for 4 weeks in regards to this and nothing seems to be making it better.  Patient has COPD and wears 2L oxygen chronically.  Patient has even and unlabored respirations at this time, but they present more shallow.  Patient denies any chest pain, N/V, or dizziness.  Patient is speaking in full sentences with no distress noted at this time.

## 2018-02-28 ENCOUNTER — Telehealth: Payer: Self-pay | Admitting: *Deleted

## 2018-02-28 NOTE — Telephone Encounter (Signed)
Denial for Perforomist faxed to [facility]. Needs to be ran thru a\b. Nothing further needed.

## 2018-02-28 NOTE — ED Notes (Signed)
Peripheral IV discontinued. Catheter intact. No signs of infiltration or redness. Gauze applied to IV site.   Discharge instructions reviewed with patient. Questions fielded by this RN. Patient verbalizes understanding of instructions. Patient discharged home in stable condition per [place]. No acute distress noted at time of discharge.

## 2018-03-01 ENCOUNTER — Telehealth: Payer: Self-pay | Admitting: Internal Medicine

## 2018-03-01 NOTE — Telephone Encounter (Signed)
Patient recent ed visit for sob and was given abx and steroids .  Patient wife wants to know if he should still do smw .  Please call.

## 2018-03-01 NOTE — Telephone Encounter (Signed)
Spoke with pt and informed that we should move SMW test out a week after off of abx and steroids. appt r/s. Nothing further needed.

## 2018-03-03 ENCOUNTER — Inpatient Hospital Stay (HOSPITAL_COMMUNITY)
Admit: 2018-03-03 | Discharge: 2018-03-03 | Disposition: A | Discharge status: Home / Self Care | Payer: Medicare HMO | Attending: Physician Assistant | Admitting: Physician Assistant

## 2018-03-03 ENCOUNTER — Other Ambulatory Visit: Payer: Self-pay

## 2018-03-03 ENCOUNTER — Encounter: Payer: Self-pay | Admitting: *Deleted

## 2018-03-03 ENCOUNTER — Ambulatory Visit: Payer: Medicare HMO

## 2018-03-03 ENCOUNTER — Emergency Department: Payer: Medicare HMO

## 2018-03-03 ENCOUNTER — Inpatient Hospital Stay
Admission: EM | Admit: 2018-03-03 | Discharge: 2018-03-06 | DRG: 190 | Disposition: A | Discharge status: Home / Self Care | Payer: Medicare HMO | Attending: Internal Medicine | Admitting: Internal Medicine

## 2018-03-03 DIAGNOSIS — Z87891 Personal history of nicotine dependence: Secondary | ICD-10-CM

## 2018-03-03 DIAGNOSIS — Z8774 Personal history of (corrected) congenital malformations of heart and circulatory system: Secondary | ICD-10-CM

## 2018-03-03 DIAGNOSIS — F419 Anxiety disorder, unspecified: Secondary | ICD-10-CM | POA: Diagnosis present

## 2018-03-03 DIAGNOSIS — Z7952 Long term (current) use of systemic steroids: Secondary | ICD-10-CM

## 2018-03-03 DIAGNOSIS — I110 Hypertensive heart disease with heart failure: Secondary | ICD-10-CM | POA: Diagnosis present

## 2018-03-03 DIAGNOSIS — Z955 Presence of coronary angioplasty implant and graft: Secondary | ICD-10-CM

## 2018-03-03 DIAGNOSIS — J9601 Acute respiratory failure with hypoxia: Secondary | ICD-10-CM | POA: Diagnosis present

## 2018-03-03 DIAGNOSIS — Z951 Presence of aortocoronary bypass graft: Secondary | ICD-10-CM

## 2018-03-03 DIAGNOSIS — I5023 Acute on chronic systolic (congestive) heart failure: Secondary | ICD-10-CM | POA: Diagnosis present

## 2018-03-03 DIAGNOSIS — Z9842 Cataract extraction status, left eye: Secondary | ICD-10-CM | POA: Diagnosis not present

## 2018-03-03 DIAGNOSIS — I255 Ischemic cardiomyopathy: Secondary | ICD-10-CM | POA: Diagnosis present

## 2018-03-03 DIAGNOSIS — E43 Unspecified severe protein-calorie malnutrition: Secondary | ICD-10-CM | POA: Diagnosis present

## 2018-03-03 DIAGNOSIS — J9691 Respiratory failure, unspecified with hypoxia: Secondary | ICD-10-CM | POA: Diagnosis present

## 2018-03-03 DIAGNOSIS — I2510 Atherosclerotic heart disease of native coronary artery without angina pectoris: Secondary | ICD-10-CM | POA: Diagnosis present

## 2018-03-03 DIAGNOSIS — L8990 Pressure ulcer of unspecified site, unspecified stage: Secondary | ICD-10-CM

## 2018-03-03 DIAGNOSIS — Z961 Presence of intraocular lens: Secondary | ICD-10-CM | POA: Diagnosis present

## 2018-03-03 DIAGNOSIS — J302 Other seasonal allergic rhinitis: Secondary | ICD-10-CM | POA: Diagnosis present

## 2018-03-03 DIAGNOSIS — G4730 Sleep apnea, unspecified: Secondary | ICD-10-CM | POA: Diagnosis present

## 2018-03-03 DIAGNOSIS — I252 Old myocardial infarction: Secondary | ICD-10-CM

## 2018-03-03 DIAGNOSIS — E1143 Type 2 diabetes mellitus with diabetic autonomic (poly)neuropathy: Secondary | ICD-10-CM | POA: Diagnosis present

## 2018-03-03 DIAGNOSIS — K219 Gastro-esophageal reflux disease without esophagitis: Secondary | ICD-10-CM | POA: Diagnosis present

## 2018-03-03 DIAGNOSIS — Z9841 Cataract extraction status, right eye: Secondary | ICD-10-CM

## 2018-03-03 DIAGNOSIS — C9310 Chronic myelomonocytic leukemia not having achieved remission: Secondary | ICD-10-CM | POA: Diagnosis present

## 2018-03-03 DIAGNOSIS — Z681 Body mass index (BMI) 19 or less, adult: Secondary | ICD-10-CM

## 2018-03-03 DIAGNOSIS — L89152 Pressure ulcer of sacral region, stage 2: Secondary | ICD-10-CM | POA: Diagnosis present

## 2018-03-03 DIAGNOSIS — Z9981 Dependence on supplemental oxygen: Secondary | ICD-10-CM

## 2018-03-03 DIAGNOSIS — Z79899 Other long term (current) drug therapy: Secondary | ICD-10-CM

## 2018-03-03 DIAGNOSIS — Z8673 Personal history of transient ischemic attack (TIA), and cerebral infarction without residual deficits: Secondary | ICD-10-CM | POA: Diagnosis not present

## 2018-03-03 DIAGNOSIS — K5790 Diverticulosis of intestine, part unspecified, without perforation or abscess without bleeding: Secondary | ICD-10-CM | POA: Diagnosis present

## 2018-03-03 DIAGNOSIS — E785 Hyperlipidemia, unspecified: Secondary | ICD-10-CM | POA: Diagnosis present

## 2018-03-03 DIAGNOSIS — M1990 Unspecified osteoarthritis, unspecified site: Secondary | ICD-10-CM | POA: Diagnosis present

## 2018-03-03 DIAGNOSIS — J441 Chronic obstructive pulmonary disease with (acute) exacerbation: Secondary | ICD-10-CM

## 2018-03-03 DIAGNOSIS — I340 Nonrheumatic mitral (valve) insufficiency: Secondary | ICD-10-CM

## 2018-03-03 DIAGNOSIS — Z7951 Long term (current) use of inhaled steroids: Secondary | ICD-10-CM

## 2018-03-03 DIAGNOSIS — I4510 Unspecified right bundle-branch block: Secondary | ICD-10-CM | POA: Diagnosis present

## 2018-03-03 DIAGNOSIS — Z7984 Long term (current) use of oral hypoglycemic drugs: Secondary | ICD-10-CM

## 2018-03-03 LAB — BASIC METABOLIC PANEL
Anion gap: 10 (ref 5–15)
BUN: 15 mg/dL (ref 8–23)
CHLORIDE: 101 mmol/L (ref 98–111)
CO2: 26 mmol/L (ref 22–32)
CREATININE: 0.58 mg/dL — AB (ref 0.61–1.24)
Calcium: 9.5 mg/dL (ref 8.9–10.3)
GFR calc Af Amer: >60 mL/min (ref >60)
GFR calc non Af Amer: >60 mL/min (ref >60)
Glucose, Bld: 138 mg/dL — ABNORMAL HIGH (ref 70–99)
POTASSIUM: 4.9 mmol/L (ref 3.5–5.1)
Sodium: 137 mmol/L (ref 135–145)

## 2018-03-03 LAB — GLUCOSE, CAPILLARY
Glucose-Capillary: 194 mg/dL — ABNORMAL HIGH (ref 70–99)
Glucose-Capillary: 129 mg/dL — ABNORMAL HIGH (ref 70–99)
Glucose-Capillary: 164 mg/dL — ABNORMAL HIGH (ref 70–99)

## 2018-03-03 LAB — CBC
HCT: 33.6 % — ABNORMAL LOW (ref 40.0–52.0)
Hemoglobin: 11.3 g/dL — ABNORMAL LOW (ref 13.0–18.0)
MCH: 32.9 pg (ref 26.0–34.0)
MCHC: 33.6 g/dL (ref 32.0–36.0)
MCV: 97.9 fL (ref 80.0–100.0)
PLATELETS: 260 10*3/uL (ref 150–440)
RBC: 3.43 MIL/uL — AB (ref 4.40–5.90)
RDW: 16.8 % — AB (ref 11.5–14.5)
WBC: 9.3 10*3/uL (ref 3.8–10.6)

## 2018-03-03 LAB — HEMOGLOBIN A1C
HEMOGLOBIN A1C: 6.3 % — AB (ref 4.8–5.6)
MEAN PLASMA GLUCOSE: 134.11 mg/dL

## 2018-03-03 LAB — BRAIN NATRIURETIC PEPTIDE: B NATRIURETIC PEPTIDE 5: 1011 pg/mL — AB (ref 0.0–100.0)

## 2018-03-03 LAB — TSH: TSH: 1.93 u[IU]/mL (ref 0.350–4.500)

## 2018-03-03 LAB — TROPONIN I

## 2018-03-03 MED ORDER — LORAZEPAM 1 MG PO TABS
1.0000 mg | ORAL_TABLET | ORAL | Status: DC | PRN
  Administered 2018-03-03 – 2018-03-06 (×7): 1 mg via ORAL
  Filled 2018-03-03 (×8): qty 1

## 2018-03-03 MED ORDER — ORAL CARE MOUTH RINSE
15.0000 mL | Freq: Two times a day (BID) | OROMUCOSAL | Status: DC
  Administered 2018-03-05: 22:00:00 15 mL via OROMUCOSAL

## 2018-03-03 MED ORDER — FUROSEMIDE 10 MG/ML IJ SOLN
60.0000 mg | Freq: Once | INTRAMUSCULAR | Status: AC
  Administered 2018-03-03: 08:00:00 60 mg via INTRAVENOUS
  Filled 2018-03-03: qty 6

## 2018-03-03 MED ORDER — AZITHROMYCIN 500 MG PO TABS
250.0000 mg | ORAL_TABLET | Freq: Every day | ORAL | Status: AC
  Administered 2018-03-04 – 2018-03-06 (×3): 250 mg via ORAL
  Filled 2018-03-03 (×3): qty 1

## 2018-03-03 MED ORDER — ACETAMINOPHEN 650 MG RE SUPP: 650.0000 mg | Freq: Four times a day (QID) | RECTAL | Status: DC | PRN

## 2018-03-03 MED ORDER — INSULIN ASPART 100 UNIT/ML ~~LOC~~ SOLN: 0.0000 [IU] | Freq: Every day | SUBCUTANEOUS | Status: DC

## 2018-03-03 MED ORDER — BUDESONIDE 0.5 MG/2ML IN SUSP
0.5000 mg | Freq: Two times a day (BID) | RESPIRATORY_TRACT | Status: DC
  Administered 2018-03-04 – 2018-03-05 (×3): 0.5 mg via RESPIRATORY_TRACT
  Filled 2018-03-03 (×2): qty 2

## 2018-03-03 MED ORDER — METHYLPREDNISOLONE SODIUM SUCC 125 MG IJ SOLR
60.0000 mg | Freq: Two times a day (BID) | INTRAMUSCULAR | Status: DC
  Administered 2018-03-03 – 2018-03-06 (×7): 60 mg via INTRAVENOUS
  Filled 2018-03-03 (×7): qty 2

## 2018-03-03 MED ORDER — METHYLPREDNISOLONE SODIUM SUCC 125 MG IJ SOLR
INTRAMUSCULAR | Status: AC
  Administered 2018-03-03: 125 mg via INTRAVENOUS
  Filled 2018-03-03: qty 2

## 2018-03-03 MED ORDER — ARFORMOTEROL TARTRATE 15 MCG/2ML IN NEBU
15.0000 ug | INHALATION_SOLUTION | Freq: Two times a day (BID) | RESPIRATORY_TRACT | Status: DC
  Administered 2018-03-04 – 2018-03-06 (×5): 15 ug via RESPIRATORY_TRACT
  Filled 2018-03-03 (×7): qty 2

## 2018-03-03 MED ORDER — ADULT MULTIVITAMIN W/MINERALS CH
1.0000 | ORAL_TABLET | Freq: Every day | ORAL | Status: DC
  Administered 2018-03-04 – 2018-03-06 (×3): 1 via ORAL
  Filled 2018-03-03 (×3): qty 1

## 2018-03-03 MED ORDER — ACETAMINOPHEN 325 MG PO TABS: 650.0000 mg | ORAL_TABLET | Freq: Four times a day (QID) | ORAL | Status: DC | PRN

## 2018-03-03 MED ORDER — ONDANSETRON HCL 4 MG/2ML IJ SOLN: 4.0000 mg | Freq: Four times a day (QID) | INTRAMUSCULAR | Status: DC | PRN

## 2018-03-03 MED ORDER — IPRATROPIUM-ALBUTEROL 0.5-2.5 (3) MG/3ML IN SOLN
3.0000 mL | Freq: Once | RESPIRATORY_TRACT | Status: AC
  Administered 2018-03-03: 3 mL via RESPIRATORY_TRACT

## 2018-03-03 MED ORDER — SODIUM CHLORIDE 0.9 % IV SOLN
1.0000 g | Freq: Once | INTRAVENOUS | Status: AC
  Administered 2018-03-03: 04:00:00 1 g via INTRAVENOUS
  Filled 2018-03-03: qty 10

## 2018-03-03 MED ORDER — IPRATROPIUM-ALBUTEROL 0.5-2.5 (3) MG/3ML IN SOLN
3.0000 mL | Freq: Four times a day (QID) | RESPIRATORY_TRACT | Status: DC
  Administered 2018-03-03 – 2018-03-06 (×13): 3 mL via RESPIRATORY_TRACT
  Filled 2018-03-03 (×14): qty 3

## 2018-03-03 MED ORDER — BENEPROTEIN PO POWD
2.0000 | Freq: Three times a day (TID) | ORAL | Status: DC
  Administered 2018-03-04: 08:00:00 12 g via ORAL

## 2018-03-03 MED ORDER — VITAMIN C 500 MG PO TABS
250.0000 mg | ORAL_TABLET | Freq: Two times a day (BID) | ORAL | Status: DC
  Administered 2018-03-03 – 2018-03-06 (×5): 250 mg via ORAL
  Filled 2018-03-03 (×7): qty 0.5

## 2018-03-03 MED ORDER — AZITHROMYCIN 500 MG IV SOLR
500.0000 mg | Freq: Once | INTRAVENOUS | Status: AC
  Administered 2018-03-03: 500 mg via INTRAVENOUS
  Filled 2018-03-03: qty 500

## 2018-03-03 MED ORDER — ENOXAPARIN SODIUM 40 MG/0.4ML ~~LOC~~ SOLN
40.0000 mg | SUBCUTANEOUS | Status: DC
  Administered 2018-03-03 – 2018-03-05 (×3): 40 mg via SUBCUTANEOUS
  Filled 2018-03-03 (×3): qty 0.4

## 2018-03-03 MED ORDER — ONDANSETRON HCL 4 MG PO TABS: 4.0000 mg | ORAL_TABLET | Freq: Four times a day (QID) | ORAL | Status: DC | PRN

## 2018-03-03 MED ORDER — DOCUSATE SODIUM 100 MG PO CAPS
100.0000 mg | ORAL_CAPSULE | Freq: Two times a day (BID) | ORAL | Status: DC
  Administered 2018-03-03 – 2018-03-06 (×5): 100 mg via ORAL
  Filled 2018-03-03 (×7): qty 1

## 2018-03-03 MED ORDER — LORAZEPAM 2 MG/ML IJ SOLN
0.5000 mg | Freq: Once | INTRAMUSCULAR | Status: AC
  Administered 2018-03-03: 0.5 mg via INTRAVENOUS
  Filled 2018-03-03: qty 1

## 2018-03-03 MED ORDER — INSULIN ASPART 100 UNIT/ML ~~LOC~~ SOLN
0.0000 [IU] | Freq: Three times a day (TID) | SUBCUTANEOUS | Status: DC
  Administered 2018-03-03: 2 [IU] via SUBCUTANEOUS
  Administered 2018-03-03: 3 [IU] via SUBCUTANEOUS
  Administered 2018-03-04 (×2): 2 [IU] via SUBCUTANEOUS
  Administered 2018-03-04: 5 [IU] via SUBCUTANEOUS
  Administered 2018-03-05: 3 [IU] via SUBCUTANEOUS
  Administered 2018-03-05: 12:00:00 8 [IU] via SUBCUTANEOUS
  Administered 2018-03-05 – 2018-03-06 (×2): 2 [IU] via SUBCUTANEOUS
  Administered 2018-03-06: 13:00:00 3 [IU] via SUBCUTANEOUS
  Filled 2018-03-03 (×10): qty 1

## 2018-03-03 MED ORDER — METHYLPREDNISOLONE SODIUM SUCC 125 MG IJ SOLR
125.0000 mg | Freq: Once | INTRAMUSCULAR | Status: AC
  Administered 2018-03-03: 125 mg via INTRAVENOUS

## 2018-03-03 MED ORDER — ALBUTEROL SULFATE (2.5 MG/3ML) 0.083% IN NEBU: 2.5000 mg | INHALATION_SOLUTION | RESPIRATORY_TRACT | Status: DC | PRN

## 2018-03-03 MED ORDER — IPRATROPIUM-ALBUTEROL 0.5-2.5 (3) MG/3ML IN SOLN
3.0000 mL | Freq: Once | RESPIRATORY_TRACT | Status: AC
  Administered 2018-03-03: 3 mL via RESPIRATORY_TRACT
  Filled 2018-03-03: qty 3

## 2018-03-03 MED ORDER — IPRATROPIUM-ALBUTEROL 0.5-2.5 (3) MG/3ML IN SOLN
RESPIRATORY_TRACT | Status: AC
  Administered 2018-03-03: 3 mL via RESPIRATORY_TRACT
  Filled 2018-03-03: qty 6

## 2018-03-03 NOTE — H&P (Signed)
Tony Watson is an 67 y.o. male.   Chief Complaint: Shortness of breath HPI: The patient with past medical history of CHF, CAD status post MI and PCI, COPD, diabetes and hypertension presents to the emergency department complaining of shortness of breath.  The patient was seen here 4 days ago and discharged from the emergency department with antibiotics and steroids for COPD exacerbation.  He returns tonight with shortness of breath.  He received multiple breathing treatments as well as steroids and antibiotics for presumed pneumonia but continued to have respiratory distress.  Oxygen saturations were below 87% on room air but improved with 2 L of oxygen via nasal cannula.  The patient displayed significant anxiety and increased work of breathing which prompted the emergency department staff to call the hospitalist service for admission.  Past Medical History:  Diagnosis Date  . Arthritis   . Asthma   . Bronchitis   . CAD (coronary artery disease)    inferior MI 11/99 (in [place]) with PCI to [facility]. last [place] 2004 [facility]): EF 50%, patent RCA stent. no obstructive disease. last myoview 2008: EF 42%, inferior infarct, no ischemia.   . Cancer ([place])    CMML [place]  . CHF (congestive heart failure) ([place])   . COPD (chronic obstructive pulmonary disease) ([place])   . Cough    CHRONIC  . Diabetic autonomic neuropathy associated with type 2 diabetes mellitus ([place]) 03/27/2015  . Diverticulosis 2002  . DM2 (diabetes mellitus, type 2) ([facility])    A1c 6.0% 12/11  . Dyspnea   . Edema   . GERD (gastroesophageal reflux disease)   . H/O wheezing   . HTN (hypertension)   . Hyperlipidemia   . Ischemic cardiomyopathy    mild with EF 42% on myoviews 2008.  . Medical non-compliance   . Myocardial infarction ([place])   . Nicotine dependence    chronic, active  . Pneumonia    in the past  . Seasonal allergic rhinitis   . Sleep apnea   . Smoker   . Spinal stenosis   . 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 Systolic heart failure secondary to coronary artery disease ([place]) 05/30/2012  . TIA (transient ischemic attack)    hx; now s/p PFO closure 2004 (in [place])  . Ulcer     Past Surgical History:  Procedure Laterality Date  . BACK SURGERY     patient denies-just lumbar punctures  . BRONCHOSCOPY    . CARDIAC CATHETERIZATION  11/99   CI per PM[facility]  . CATARACT EXTRACTION W/PHACO Right 11/24/2016   Procedure: CATARACT EXTRACTION PHACO AND INTRAOCULAR LENS PLACEMENT (IOC);  Surgeon: Birder Robson, MD;  Location: [facility] ORS;  Service: Ophthalmology;  Laterality: Right;  [place] 1:04.3 AP% 22.3 CDE 14.35 Fluid pack lot # [id number]  . CATARACT EXTRACTION W/PHACO Left 12/29/2016   Procedure: CATARACT EXTRACTION PHACO AND INTRAOCULAR LENS PLACEMENT (IOC);  Surgeon: Birder Robson, MD;  Location: [facility] ORS;  Service: Ophthalmology;  Laterality: Left;  [place] 00:52 AP% 18.5 CDE 9.67 Fluid pack lot # [id number]  . COLONOSCOPY    . COLONOSCOPY WITH PROPOFOL N/A 05/25/2017   Procedure: COLONOSCOPY WITH PROPOFOL;  Surgeon: Jonathon Bellows, MD;  Location[facility] END[facility]COPY;  Service: Gastroenterology;  Laterality: N/A;  . CORONARY ANGIOPLASTY     STENT  . CORONARY ARTERY BYPASS GRAFT     stent  . ESOPHAGOGASTRODUODENOSCOPY (EGD) WITH PROPOFOL N/A 05/25/2017   Procedure: ESOPHAGOGASTRODUODENOSCOPY (EGD) WITH PROPOFOL;  Surgeon: Jonathon Bellows, MD;  Location:  [facility] ENDOSCOPY;  Service: Gastroenterology;  Laterality: N/A;  . EYE SURGERY    . FLEXIBLE BRONCHOSCOPY N/A 05/19/2017   Procedure: FLEXIBLE BRONCHOSCOPY;  Surgeon: Wilhelmina Mcardle, MD;  Location: [facility] ORS;  Service: Pulmonary;  Laterality: N/A;  . open heart surgery  2004   PFO repair  . UPPER GI ENDOSCOPY      Family History  Problem Relation Age of Onset  . Coronary artery disease Unknown        family hx  . Breast cancer Unknown        1st egree relative <50   Social History:  reports that he quit smoking about 5 weeks ago. His smoking use included  cigarettes. He has a 22.50 pack-year smoking history. He has never used smokeless tobacco. He reports that he drinks alcohol. He reports that he does not use drugs.  Allergies: No Known Allergies  Medications Prior to Admission  Medication Sig Dispense Refill  . acetaminophen (TYLENOL) 500 MG tablet Take 1,000 mg by mouth daily as needed for moderate pain or headache.    Marland Kitchen azithromycin (ZITHROMAX) 250 MG tablet Take 1 tablet (250 mg total) by mouth daily. 4 each 0  . budesonide (PULMICORT) 0.5 MG/2ML nebulizer solution Take 2 mLs (0.5 mg total) by nebulization 2 (two) times daily. 1440 mL 0  . formoterol (PERFOROMIST) 20 MCG/2ML nebulizer solution Take 2 mLs (20 mcg total) by nebulization 2 (two) times daily. 500 mL 12  . gabapentin (NEURONTIN) 300 MG capsule Take 1 capsule (300 mg total) by mouth 2 (two) times daily. (Patient taking differently: Take 300 mg by mouth at bedtime. ) 180 capsule 3  . ipratropium-albuterol (DUONEB) 0.5-2.5 (3) MG/3ML SOLN TAKE 3 MLS BY NEBULIZATION EVERY 4 (FOUR) HOURS AS NEEDED. 630 mL 5  . megestrol (MEGACE) 40 MG tablet Take 1 tablet (40 mg total) by mouth daily. 30 tablet 2  . omeprazole (PRILOSEC) 20 MG capsule Take 20 mg by mouth daily.    . predniSONE (DELTASONE) 20 MG tablet Take 2 tablets (40 mg total) by mouth daily. 10 tablet 0  . pseudoephedrine (SUDAFED) 30 MG tablet Take 30 mg by mouth daily as needed for congestion.     Marland Kitchen tiZANidine (ZANAFLEX) 4 MG tablet TAKE 1 TABLET (4 MG TOTAL) BY MOUTH NIGHTLY. 90 tablet 3  . carvedilol (COREG) 3.125 MG tablet TAKE 1 TABLET (3.125 MG TOTAL) BY MOUTH 2 (TWO) TIMES DAILY. (Patient not taking: Reported on 03/03/2018) 180 tablet 0  . Cholecalciferol (VITAMIN D) 2000 units CAPS Take 2,000 Units by mouth every evening.    . Glucosamine HCl 1000 MG TABS Take 1,000 mg by mouth every evening.    . Glucosamine Sulfate (GLUCOSAMINE RELIEF) 1000 MG TABS Take by mouth.    . Magnesium 250 MG TABS Take 250 mg by mouth every  evening.    . metFORMIN (GLUCOPHAGE-XR) 500 MG 24 hr tablet TAKE 1 TABLET (500 MG TOTAL) BY MOUTH DAILY WITH BREAKFAST. (Patient not taking: Reported on 03/03/2018) 90 tablet 0  . oxymetazoline (AFRIN) 0.05 % nasal spray Place 1 spray into both nostrils daily as needed for congestion.    Marland Kitchen SPIRIVA HANDIHALER 18 MCG inhalation capsule INHALE 1 CAPSULE VIA HANDIHALER ONCE DAILY AT THE SAME TIME EVERY DAY (Patient not taking: Reported on 03/03/2018) 30 capsule 1  . SYMBICORT 160-4.5 MCG/ACT inhaler INHALE 2 PUFFS INTO THE LUNGS 2 (TWO) TIMES DAILY. (Patient not taking: Reported on 03/03/2018) 10.2 Inhaler 4    Results for orders placed  or performed during the hospital encounter of 03/03/18 (from the past 48 hour(s))  Basic metabolic panel     Status: Abnormal   Collection Time: 03/03/18  1:17 AM  Result Value Ref Range   Sodium 137 135 - 145 mmol/L   Potassium 4.9 3.5 - 5.1 mmol/L    Comment: HEMOLYSIS AT THIS LEVEL MAY AFFECT RESULT   Chloride 101 98 - 111 mmol/L   CO2 26 22 - 32 mmol/L   Glucose, Bld 138 (H) 70 - 99 mg/dL   BUN 15 8 - 23 mg/dL   Creatinine, Ser 0.58 (L) 0.61 - 1.24 mg/dL   Calcium 9.5 8.9 - 10.3 mg/dL   GFR calc non Af Amer >60 >60 mL/min   GFR calc Af Amer >60 >60 mL/min    Comment: (NOTE) The eGFR has been calculated using the CKD EPI equation. This calculation has not been validated in all clinical situations. eGFR's persistently <60 mL/min signify possible Chronic Kidney Disease.    Anion gap 10 5 - 15    Comment: Performed at [facility], [address] [place], [place] [address]  CBC     Status: Abnormal   Collection Time: 03/03/18  1:17 AM  Result Value Ref Range   WBC 9.3 3.8 - 10.6 K/uL   RBC 3.43 (L) 4.40 - 5.90 MIL/uL   Hemoglobin 11.3 (L) 13.0 - 18.0 g/dL   HCT 33.6 (L) 40.0 - 52.0 %   MCV 97.9 80.0 - 100.0 fL   MCH 32.9 26.0 - 34.0 pg   MCHC 33.6 32.0 - 36.0 g/dL   RDW 16.8 (H) 11.5 - 14.5 %   Platelets 260 150 - 440 K/uL    Comment:  Performed at [facility], [address] [place], [place] [address]  Brain natriuretic peptide     Status: Abnormal   Collection Time: 03/03/18  1:17 AM  Result Value Ref Range   B Natriuretic Peptide 1,011.0 (H) 0.0 - 100.0 pg/mL    Comment: Performed at [facility], [address] [place], [place] [address]  Troponin I     Status: None   Collection Time: 03/03/18  1:17 AM  Result Value Ref Range   Troponin I <0.03 <0.03 ng/mL    Comment: Performed at [facility], [address] [place], [place] [address]  TSH     Status: None   Collection Time: 03/03/18  1:17 AM  Result Value Ref Range   TSH 1.930 0.350 - 4.500 uIU/mL    Comment: Performed by a 3rd Generation assay with a functional sensitivity of <=0.01 uIU/mL. Performed at [facility], [address] [place], [place] [address]    Dg Chest Port 1 View  Result Date: 03/03/2018 CLINICAL DATA:  Short of breath EXAM: PORTABLE CHEST 1 VIEW COMPARISON:  02/27/2018, CT chest 05/13/2017 FINDINGS: Diffuse interstitial lung disease. No confluent opacity. Normal heart size. Post sternotomy changes. No pneumothorax. IMPRESSION: Similar appearance of diffuse interstitial lung disease. No acute focal airspace disease or pleural effusion is seen. Electronically Signed   By: Donavan Foil M.D.   On: 03/03/2018 01:36    Review of Systems  Constitutional: Negative for chills and fever.  HENT: Negative for sore throat and tinnitus.   Eyes: Negative for blurred vision and redness.  Respiratory: Positive for shortness of breath. Negative for cough.   Cardiovascular: Negative for chest pain, palpitations, orthopnea and PND.  Gastrointestinal: Negative for abdominal pain, diarrhea, nausea and vomiting.  Genitourinary:  Negative for dysuria, frequency and urgency.  Musculoskeletal: Negative for joint pain and myalgias.  Skin: Negative for rash.       No lesions  Neurological: Negative for speech change,  focal weakness and weakness.  Endo/Heme/Allergies: Does not bruise/bleed easily.       No temperature intolerance  Psychiatric/Behavioral: Negative for depression and suicidal ideas.    Blood pressure 125/87, pulse (!) 102, temperature (!) 97.5 F (36.4 C), temperature source Oral, resp. rate (!) 22, height _0  (1.803 m), weight 62.6 kg (138 lb), SpO2 100 %. Physical Exam  Constitutional: He is oriented to person, place, and time. He appears well-developed and well-nourished. He appears cachectic. He appears distressed. Nasal cannula in place.  HENT:  Head: Normocephalic and atraumatic.  Mouth/Throat: Oropharynx is clear and moist.  Eyes: Pupils are equal, round, and reactive to light. Conjunctivae and EOM are normal. No scleral icterus.  Neck: Normal range of motion. Neck supple. No JVD present. No tracheal deviation present. No thyromegaly present.  Cardiovascular: Normal rate and regular rhythm. Exam reveals no gallop and no friction rub.  No murmur heard. Respiratory: Breath sounds normal. He is in respiratory distress.  GI: Soft. Bowel sounds are normal. He exhibits no distension. There is no tenderness.  Genitourinary:  Genitourinary Comments: Deferred  Musculoskeletal: Normal range of motion. He exhibits no edema.  Lymphadenopathy:    He has no cervical adenopathy.  Neurological: He is alert and oriented to person, place, and time. No cranial nerve deficit.  Skin: Skin is warm and dry. No rash noted. No erythema.  Psychiatric: His behavior is normal. Judgment and thought content normal. His mood appears anxious.     Assessment/Plan This is a 67 year old male admitted for respiratory failure. 1.  Respiratory failure: Acute; with hypoxemia.  I have increased the positive airway pressure with high flow nasal cannula.  Supplemental oxygen as needed.  No infiltrate seen on chest x-ray.  The patient appears to have chronic interstitial disease.  Ativan as needed for anxiety.   Monitor respiratory effort to assess the need for BiPAP. 2.  Ischemic cardiomyopathy: Reportedly systolic.  No current EF evaluation although report ranges from 40 to 55%.    The patient is not on aspirin or Plavix.  Consult cardiology.  Very elevated BNP. The patient was to have an echo performed today.  Consult cardiology 3.  Hypertension: Controlled; continue Coreg 4.  Diabetes mellitus type 2: Hold metformin 5.  DVT prophylaxis: Lovenox 6.  GI prophylaxis: None The patient is a full code.  Time spent on admission orders and patient care approximately 45 minutes  Harrie Foreman, MD 03/03/2018, 6:23 AM

## 2018-03-03 NOTE — ED Notes (Signed)
ED Provider at bedside. 

## 2018-03-03 NOTE — Progress Notes (Signed)
*  PRELIMINARY RESULTS* Echocardiogram 2D Echocardiogram has been performed.  Tony Watson 03/03/2018, 2:20 PM

## 2018-03-03 NOTE — ED Provider Notes (Signed)
[facility] Emergency Department Provider Note __________   None    (approximate)  I have reviewed the triage vital signs and the nursing notes.   HISTORY  Chief Complaint Shortness of Breath   HPI Tony Watson is a 67 y.o. male with below list of chronic medical conditions including MI, valvular heart disease, COPD and CHF presents to the emergency department with progressive respiratory distress over the course of today.  Patient was seen in the emergency department on 02/27/2018 secondary to dyspnea and states that he was discharged home with steroids and Zithromax.  Patient states symptoms improved the next day however has worsened since that point.  Patient denies any fever but does admit to a productive cough.   Past Medical History:  Diagnosis Date  . Arthritis   . Asthma   . Bronchitis   . CAD (coronary artery disease)    inferior MI 11/99 (in [place]) with PCI to [facility]. last [place] 2004 [facility]): EF 50%, patent RCA stent. no obstructive disease. last myoview 2008: EF 42%, inferior infarct, no ischemia.   . Cancer ([place])    CMML [place]  . CHF (congestive heart failure) ([place])   . COPD (chronic obstructive pulmonary disease) ([place])   . Cough    CHRONIC  . Diabetic autonomic neuropathy associated with type 2 diabetes mellitus ([place]) 03/27/2015  . Diverticulosis 2002  . DM2 (diabetes mellitus, type 2) (H[facility])    A1c 6.0% 12/11  . Dyspnea   . Edema   . GERD (gastroesophageal reflux disease)   . H/O wheezing   . HTN (hypertension)   . Hyperlipidemia   . Ischemic cardiomyopathy    mild with EF 42% on myoviews 2008.  . Medical non-compliance   . Myocardial infarction ([place])   . Nicotine dependence    chronic, active  . Pneumonia    in the past  . Seasonal allergic rhinitis   . Sleep apnea   . Smoker   . Spinal stenosis   . Stroke ([place])    RECURRENT AFFECTING MEMORY  . Systolic heart failure secondary to coronary artery disease ([place]) 05/30/2012  .  TIA (transient ischemic attack)    hx; now s/p PFO closure 2004 (in [place])  . Ulcer     Patient Active Problem List   Diagnosis Date Noted  . Respiratory failure with hypoxia ([place]) 03/03/2018  . 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 Diabetic autonomic neuropathy associated with type 2 diabetes mellitus ([place]) 03/27/2015  . History of inferior MI (myocardial infarction) 10/14/2013  . CAD (coronary artery disease)   . HTN (hypertension)   . History of noncompliance with medical treatment 05/30/2012  . Systolic heart failure secondary to coronary artery disease ([place]) 05/30/2012  . COPD, severe 03/23/2011  . Other emphysema ([place]) 03/23/2011  . ALLERGIC RHINITIS CAUSE UNSPECIFIED 07/31/2010  . Type 2 diabetes mellitus with vascular disease ([place]) 01/02/2010  . TOBACCO USE 04/17/2009  . TRANSIENT ISCHEMIC ATTACKS, HX OF 04/17/2009  . Hyperlipidemia 12/19/2008  . HYPERTENSION, UNSPECIFIED 12/19/2008    Past Surgical History:  Procedure Laterality Date  . BACK SURGERY     patient denies-just lumbar punctures  . BRONCHOSCOPY    . CARDIAC CATHETERIZATION  11/99   CI per PMH  . CATARACT EXTRACTION W/PHACO Right 11/24/2016   Procedure: CATARACT EXTRACTION PHACO AND INTRAOCULAR LENS PLACEMENT (IOC);  Surgeon: Birder Robson, MD;  Location: [facility] ORS;  Service: Ophthalmology;  Laterality: Right;  [place] 1:04.3 AP%  22.3 CDE 14.35 Fluid pack lot # [id number]  . CATARACT EXTRACTION W/PHACO Left 12/29/2016   Procedure: CATARACT EXTRACTION PHACO AND INTRAOCULAR LENS PLACEMENT (IOC);  Surgeon: Birder Robson, MD;  Location: [facility] ORS;  Service: Ophthalmology;  Laterality: Left;  [place] 00:52 AP% 18.5 CDE 9.67 Fluid pack lot # [id number]  . COLONOSCOPY    . COLONOSCOPY WITH PROPOFOL N/A 05/25/2017   Procedure: COLONOSCOPY WITH PROPOFOL;  Surgeon: Jonathon Bellows, MD;  Location: [facility] ENDOSCOPY;  Service: Gastroenterology;  Laterality: N/A;  . CORONARY ANGIOPLASTY      STENT  . CORONARY ARTERY BYPASS GRAFT     stent  . ESOPHAGOGASTRODUODENOSCOPY (EGD) WITH PROPOFOL N/A 05/25/2017   Procedure: ESOPHAGOGASTRODUODENOSCOPY (EGD) WITH PROPOFOL;  Surgeon: Jonathon Bellows, MD;  Location: [facility] ENDOSCOPY;  Service: Gastroenterology;  Laterality: N/A;  . EYE SURGERY    . FLEXIBLE BRONCHOSCOPY N/A 05/19/2017   Procedure: FLEXIBLE BRONCHOSCOPY;  Surgeon: Wilhelmina Mcardle, MD;  Location: [facility] ORS;  Service: Pulmonary;  Laterality: N/A;  . open heart surgery  2004   PFO repair  . UPPER GI ENDOSCOPY      Prior to Admission medications   Medication Sig Start Date End Date Taking? Authorizing Provider  acetaminophen (TYLENOL) 500 MG tablet Take 1,000 mg by mouth daily as needed for moderate pain or headache.   Yes [provider]  azithromycin (ZITHROMAX) 250 MG tablet Take 1 tablet (250 mg total) by mouth daily. 02/27/18  Yes Harvest Dark, MD  budesonide (PULMICORT) 0.5 MG/2ML nebulizer solution Take 2 mLs (0.5 mg total) by nebulization 2 (two) times daily. 02/24/18 02/24/19 Yes Kasa, Maretta Bees, MD  formoterol (PERFOROMIST) 20 MCG/2ML nebulizer solution Take 2 mLs (20 mcg total) by nebulization 2 (two) times daily. 02/24/18  Yes [name], MD  gabapentin (NEURONTIN) 300 MG capsule Take 1 capsule (300 mg total) by mouth 2 (two) times daily. Patient taking differently: Take 300 mg by mouth at bedtime.  07/22/16  Yes Copland, Frederico Hamman, MD  ipratropium-albuterol (DUONEB) 0.5-2.5 (3) MG/3ML SOLN TAKE 3 MLS BY NEBULIZATION EVERY 4 (FOUR) HOURS AS NEEDED. 10/27/17  Yes Kasa, Maretta Bees, MD  megestrol (MEGACE) 40 MG tablet Take 1 tablet (40 mg total) by mouth daily. 04/26/17  Yes Lloyd Huger, MD  omeprazole (PRILOSEC) 20 MG capsule Take 20 mg by mouth daily.   Yes [provider]  predniSONE (DELTASONE) 20 MG tablet Take 2 tablets (40 mg total) by mouth daily. 02/27/18  Yes Harvest Dark, MD  pseudoephedrine (SUDAFED) 30 MG tablet Take 30 mg by mouth daily as  needed for congestion.    Yes [provider]  tiZANidine (ZANAFLEX) 4 MG tablet TAKE 1 TABLET (4 MG TOTAL) BY MOUTH NIGHTLY. 03/21/17  Yes Copland, Frederico Hamman, MD  carvedilol (COREG) 3.125 MG tablet TAKE 1 TABLET (3.125 MG TOTAL) BY MOUTH 2 (TWO) TIMES DAILY. Patient not taking: Reported on 03/03/2018 03/19/17   Owens Loffler, MD  Cholecalciferol (VITAMIN D) 2000 units CAPS Take 2,000 Units by mouth every evening.    [provider]  Glucosamine HCl 1000 MG TABS Take 1,000 mg by mouth every evening.    [provider]  Glucosamine Sulfate (GLUCOSAMINE RELIEF) 1000 MG TABS Take by mouth.    [provider]  Magnesium 250 MG TABS Take 250 mg by mouth every evening.    [provider]  metFORMIN (GLUCOPHAGE-XR) 500 MG 24 hr tablet TAKE 1 TABLET (500 MG TOTAL) BY MOUTH DAILY WITH BREAKFAST. Patient not taking: Reported on 03/03/2018 03/19/17   Copland,  Frederico Hamman, MD  oxymetazoline (AFRIN) 0.05 % nasal spray Place 1 spray into both nostrils daily as needed for congestion.    [provider]  SPIRIVA HANDIHALER 18 MCG inhalation capsule INHALE 1 CAPSULE VIA HANDIHALER ONCE DAILY AT [place] Patient not taking: Reported on 03/03/2018 02/01/18   [name], MD  SYMBICORT 160-4.5 MCG/ACT inhaler INHALE 2 PUFFS INTO THE LUNGS 2 (TWO) TIMES DAILY. Patient not taking: Reported on 03/03/2018 09/21/16   Owens Loffler, MD    Allergies Patient has no known allergies.  Family History  Problem Relation Age of Onset  . Coronary artery disease Unknown        family hx  . Breast cancer Unknown        1st egree relative <50    Social History Social History   Tobacco Use  . Smoking status: Former Smoker    Packs/day: 0.50    Years: 45.00    Pack years: 22.50    Types: Cigarettes    Last attempt to quit: 01/25/2018    Years since quitting: 0.1  . Smokeless tobacco: Never Used  . Tobacco comment: 1 ppd +40 years  Substance Use Topics  .  Alcohol use: Yes    Alcohol/week: 0.0 oz    Comment: weekly but last dose 1 month  . Drug use: No    Review of Systems Constitutional: No fever/chills Eyes: No visual changes. ENT: No sore throat. Cardiovascular: Denies chest pain. Respiratory: Positive for dyspnea and cough Gastrointestinal: No abdominal pain.  No nausea, no vomiting.  No diarrhea.  No constipation. Genitourinary: Negative for dysuria. Musculoskeletal: Negative for neck pain.  Negative for back pain. Integumentary: Negative for rash. Neurological: Negative for headaches, focal weakness or numbness.  ____________________________________________   PHYSICAL EXAM:  VITAL SIGNS: ED Triage Vitals  Enc Vitals Group     BP      Pulse      Resp      Temp      Temp src      SpO2      Weight      Height      Head Circumference      Peak Flow      Pain Score      Pain Loc      Pain Edu?      Excl. in [place]?     Constitutional: Alert and oriented.  Apparent respiratory difficulty  eyes: Conjunctivae are normal. Head: Atraumatic. Mouth/Throat: Mucous membranes are moist.  Oropharynx non-erythematous. Neck: No stridor.   Cardiovascular: Normal rate, regular rhythm. Good peripheral circulation. Grossly normal heart sounds. Respiratory: Tachypnea, positive accessory respiratory muscle use, diffuse rhonchi with expiratory wheezing Gastrointestinal: Soft and nontender. No distention.  Musculoskeletal: No lower extremity tenderness nor edema. No gross deformities of extremities. Neurologic:  Normal speech and language. No gross focal neurologic deficits are appreciated.  Skin:  Skin is warm, dry and intact. No rash noted. Psychiatric: Mood and affect are normal. Speech and behavior are normal.  ____________________________________________   LABS (all labs ordered are listed, but only abnormal results are displayed)  Labs Reviewed  BASIC METABOLIC PANEL - Abnormal; Notable for the following components:       Result Value   Glucose, Bld 138 (*)    Creatinine, Ser 0.58 (*)    All other components within normal limits  CBC - Abnormal; Notable for the following components:   RBC 3.43 (*)    Hemoglobin 11.3 (*)  HCT 33.6 (*)    RDW 16.8 (*)    All other components within normal limits  BRAIN NATRIURETIC PEPTIDE - Abnormal; Notable for the following components:   B Natriuretic Peptide 1,011.0 (*)    All other components within normal limits  TROPONIN I   ____________________________________________  EKG  ED ECG REPORT I, [place] N BROWN, the attending physician, personally viewed and interpreted this ECG.   Date: 03/03/2018  EKG Time: 1:23 AM  Rate: 105  Rhythm: Sinus tachycardia, right bundle branch block  Axis: Right axis deviation  Intervals: Normal  ST&T Change: None  ____________________________________________  RADIOLOGY I, [place] N BROWN, personally viewed and evaluated these images (plain radiographs) as part of my medical decision making, as well as reviewing the written report by the radiologist.  ED MD interpretation: Similar previous x-ray with evidence of interstitial lung disease  Official radiology report(s): Dg Chest Port 1 View  Result Date: 03/03/2018 CLINICAL DATA:  Short of breath EXAM: PORTABLE CHEST 1 VIEW COMPARISON:  02/27/2018, CT chest 05/13/2017 FINDINGS: Diffuse interstitial lung disease. No confluent opacity. Normal heart size. Post sternotomy changes. No pneumothorax. IMPRESSION: Similar appearance of diffuse interstitial lung disease. No acute focal airspace disease or pleural effusion is seen. Electronically Signed   By: Donavan Foil M.D.   On: 03/03/2018 01:36    ____________________________________________     Critical Care performed:     .Critical Care Performed by: Gregor Hams, MD Authorized by: Gregor Hams, MD   Critical care provider statement:    Critical care time (minutes):  30   Critical care time was  exclusive of:  Separately billable procedures and treating other patients   Critical care was necessary to treat or prevent imminent or life-threatening deterioration of the following conditions:  Respiratory failure   Critical care was time spent personally by me on the following activities:  Development of treatment plan with patient or surrogate, discussions with consultants, evaluation of patient's response to treatment, examination of patient, obtaining history from patient or surrogate, ordering and performing treatments and interventions, ordering and review of laboratory studies, ordering and review of radiographic studies, pulse oximetry, re-evaluation of patient's condition and review of old charts   I assumed direction of critical care for this patient from another provider in my specialty: no       ____________________________________________   INITIAL IMPRESSION / ASSESSMENT AND PLAN / ED COURSE  As part of my medical decision making, I reviewed the following data within the electronic MEDICAL RECORD NUMBER 67 year old male presenting with above-stated history and physical exam secondary to respiratory distress.  Patient given 2 DuoNeb treatments immediately on arrival as well as 125 mg of Solu-Medrol with improvement of dyspnea.  Concern for possible occult pneumonia and as such patient given ceftriaxone and azithromycin given failure of outpatient management.  Patient discussed with Dr. Marcille Blanco hospitalist on call for hospital admission for further evaluation and management. ____________________________________________  FINAL CLINICAL IMPRESSION(S) / ED DIAGNOSES  Final diagnoses:  COPD exacerbation ([place])  Acute respiratory failure with hypoxia ([place])     MEDICATIONS GIVEN DURING THIS VISIT:  Medications  cefTRIAXone (ROCEPHIN) 1 g in sodium chloride 0.9 % 100 mL IVPB (has no administration in time range)  LORazepam (ATIVAN) injection 0.5 mg (has no administration in time  range)  ipratropium-albuterol (DUONEB) 0.5-2.5 (3) MG/3ML nebulizer solution 3 mL (3 mLs Nebulization Given 03/03/18 0128)  ipratropium-albuterol (DUONEB) 0.5-2.5 (3) MG/3ML nebulizer solution 3 mL (3 mLs Nebulization Given 03/03/18 0128)  ipratropium-albuterol (DUONEB) 0.5-2.5 (3) MG/3ML nebulizer solution 3 mL (3 mLs Nebulization Given 03/03/18 0127)  methylPREDNISolone sodium succinate (SOLU-MEDROL) 125 mg/2 mL injection 125 mg (125 mg Intravenous Given 03/03/18 0124)  azithromycin (ZITHROMAX) 500 mg in sodium chloride 0.9 % 250 mL IVPB (500 mg Intravenous Transfusing/Transfer 03/03/18 0305)     ED Discharge Orders    None       Note:  This document was prepared using Dragon voice recognition software and may include unintentional dictation errors.    Gregor Hams, MD 03/03/18 [phone]

## 2018-03-03 NOTE — Progress Notes (Signed)
Initial Nutrition Assessment  DOCUMENTATION CODES:   Severe malnutrition in context of chronic illness  INTERVENTION:  Recommend liberalizing diet to regular. Patient is severely malnourished and needs to maximize calorie and protein intake.  Continue Boost Plus po BID (patient brought in from home - not on formulary here), each supplement provides 360 kcal and 14 grams of protein.  Provide Magic cup TID with meals, each supplement provides 290 kcal and 9 grams of protein.  Provide Beneprotein 2-3 scoops TID with meals. Each scoop provides 25 kcal and 6 grams of protein.  Provide daily MVI.  Provide Vitamin C 250 mg BID.  NUTRITION DIAGNOSIS:   Severe Malnutrition related to chronic illness(CMML, hx CVA, CHF, COPD) as evidenced by severe fat depletion, severe muscle depletion, 28.9% weight loss over 11 months.  GOAL:   Patient will meet greater than or equal to 90% of their needs  MONITOR:   PO intake, Supplement acceptance, Labs, Weight trends, Skin, I & O's  REASON FOR ASSESSMENT:   Malnutrition Screening Tool    ASSESSMENT:   67 year old male with PMHx of of ischemic cardiomyopathy, CHF, hx TIA, DM type 2, diverticulosis, GERD, COPD, CAD, HTN, HLD, hx MI, asthma, hx CVA, spinal stenosis, CMML (no current treatment) now admitted with acute hypoxic respiratory failure secondary to acute exacerbation of COPD, acute on chronic systolic congestive heart failure.   Met with patient and wife at bedside. Patient reports he has lost a significant amount of weight since he was diagnosed with CMML. He follows with outpatient RD at [facility] and has been working on gaining his weight back. His appetite has been poor for the past year. He tries to eat 3 meals per day but they are fairly small meals. For breakfast he may have oatmeal made with butter and sugar or eggs. Lunch is usually a graham cracker with peanut butter or a homemade cookie made by wife. Dinner is usually a  meat with extra gravy and potatoes. He also eats a homemade high-calorie custard daily and enjoys pudding and yogurt. He drinks Boost Plus 1-2 bottles daily. He also has a protein powder they sprinkle onto food for added protein. Noted on exam patient has ecchymosis. He reports bruising easily lately. With stage II pressure ulcer patient has increased vitamin C needs and is likely not getting enough from diet.  UBW 220 lbs prior to diagnosis with CMML. Per chart patient was 199.5 lbs on 06/29/2016, 159.1 lbs on 03/01/2017, 141.8 lbs on 05/19/2017, 136.4 lbs on 01/17/2018. Patient lost 57.7 lbs (28.9% body weight) over 11 months from 06/29/2016 to 05/19/2017, which is significant for time frame. Patient reports his weight is slowly starting to trend up and he is now 138 lbs. There is no measured weight from this admission as current weight is reported.  Medications reviewed and include: azithromycin, Colace, Novolot 0-15 units TID, Novolog 0-5 units QHS, Solu-Medrol 60 mg Q12hrs IV.  Labs reviewed: CBG 194, Creatinine 0.58, HgbA1c 6.3.  NUTRITION - FOCUSED PHYSICAL EXAM:   Most Recent Value  Orbital Region  Severe depletion  Upper Arm Region  Severe depletion  Thoracic and Lumbar Region  Severe depletion  Buccal Region  Severe depletion  Temple Region  Severe depletion  Clavicle Bone Region  Severe depletion  Clavicle and Acromion Bone Region  Severe depletion  Scapular Bone Region  Severe depletion  Dorsal Hand  Severe depletion  Patellar Region  Severe depletion  Anterior Thigh Region  Severe depletion  Posterior  Calf Region  Severe depletion  Edema (RD Assessment)  None  Hair  Reviewed  Eyes  Reviewed  Mouth  Reviewed  Skin  Reviewed [ecchymosis]  Nails  Reviewed      Diet Order:   Diet Order           Diet Carb Modified Fluid consistency: Thin; Room service appropriate? Yes  Diet effective now          EDUCATION NEEDS:   No education needs have been identified at this  time  Skin:  Skin Assessment: Skin Integrity Issues: Skin Integrity Issues:: Stage II Stage II: mid buttocks  Last BM:  PTA (03/02/2018 per chart)  Height:   Ht Readings from Last 1 Encounters:  03/03/18 '5\' 11"'$  (1.803 m)    Weight:   Wt Readings from Last 1 Encounters:  03/03/18 138 lb (62.6 kg)    Ideal Body Weight:  78.2 kg  BMI:  Body mass index is 19.25 kg/m.  Estimated Nutritional Needs:   Kcal:  1860-2150 (MSJ x 1.3-1.5)  Protein:  95-110 grams (1.5-1.8 grams/kg)  Fluid:  1.8-2.1 L/day (1 mL/kcal)  Willey Blade, MS, RD, LDN Office: [phone] Pager: [phone] After Hours/Weekend Pager: [phone]

## 2018-03-03 NOTE — Clinical Social Work Note (Signed)
CSW consulted for DC planning. Patient has not been evaluated by Physical therapy yet. CSW will watch for needs.   [place], [place]

## 2018-03-03 NOTE — Progress Notes (Signed)
Placed pt on HFNC per RN and MD order. Pt Spo2 99% on 6L HFNC. HR 115, RR 24.

## 2018-03-03 NOTE — Progress Notes (Addendum)
Review of previous CT scans of the patients lungs show coarsening of interstitium more than emphysematous disease.  Also, the patient is producing thin clear sputum rather than thick white expectorate seen more frequently in COPD exacerbation. Pulmonary vascular congestion contributing (but no interstitial edema). Also no wheezing on exam. Echo shows good RV systolic function (depsite poor overall EF) but could not determine wedge pressure. Consider right heart cath and/or pulmonary consult.  Restart LABA and ICS.

## 2018-03-03 NOTE — ED Notes (Signed)
Spoke to Dr. Marcille Blanco, wants high flow nasal cannula for the patient. Report has been called to the floor, notified RT to set up HFNC for pt arrival.

## 2018-03-03 NOTE — Progress Notes (Signed)
[place] at [place] NAME: Tony Watson    MR#:  [id number]  DATE OF BIRTH:  Jun 29, 1951  SUBJECTIVE:  CHIEF COMPLAINT:   Chief Complaint  Patient presents with  . Shortness of Breath   On high flow.  6 L.  Continues to have significant shortness of breath.  REVIEW OF SYSTEMS:    Review of Systems  Constitutional: Positive for malaise/fatigue. Negative for chills and fever.  HENT: Negative for sore throat.   Eyes: Negative for blurred vision, double vision and pain.  Respiratory: Positive for cough and shortness of breath. Negative for hemoptysis and wheezing.   Cardiovascular: Positive for orthopnea. Negative for chest pain, palpitations and leg swelling.  Gastrointestinal: Negative for abdominal pain, constipation, diarrhea, heartburn, nausea and vomiting.  Genitourinary: Negative for dysuria and hematuria.  Musculoskeletal: Negative for back pain and joint pain.  Skin: Negative for rash.  Neurological: Negative for sensory change, speech change, focal weakness and headaches.  Endo/Heme/Allergies: Does not bruise/bleed easily.  Psychiatric/Behavioral: Negative for depression. The patient is not nervous/anxious.     DRUG ALLERGIES:  No Known Allergies  VITALS:  Blood pressure 125/87, pulse (!) 102, temperature (!) 97.5 F (36.4 C), temperature source Oral, resp. rate (!) 22, height 5\' 11"  (1.803 m), weight 62.6 kg (138 lb), SpO2 98 %.  PHYSICAL EXAMINATION:   Physical Exam  GENERAL:  67 y.o.-year-old patient lying in the bed with respiratory distress EYES: Pupils equal, round, reactive to light and accommodation. No scleral icterus. Extraocular muscles intact.  HEENT: Head atraumatic, normocephalic. Oropharynx and nasopharynx clear.  NECK:  Supple, no jugular venous distention. No thyroid enlargement, no tenderness.  LUNGS: Decreased breath sounds with bilateral wheezing and bibasilar crackles.  Increased work of  breathing CARDIOVASCULAR: S1, S2 normal. No murmurs, rubs, or gallops.  ABDOMEN: Soft, nontender, nondistended. Bowel sounds present. No organomegaly or mass.  EXTREMITIES: No cyanosis, clubbing or edema b/l.    NEUROLOGIC: Cranial nerves II through XII are intact. No focal Motor or sensory deficits b/l.   PSYCHIATRIC: The patient is alert and oriented x 3.  SKIN: No obvious rash, lesion, or ulcer.   LABORATORY PANEL:   CBC Recent Labs  Lab 03/03/18 0117  WBC 9.3  HGB 11.3*  HCT 33.6*  PLT 260   ------------------------------------------------------------------------------------------------------------------ Chemistries  Recent Labs  Lab 03/03/18 0117  NA 137  K 4.9  CL 101  CO2 26  GLUCOSE 138*  BUN 15  CREATININE 0.58*  CALCIUM 9.5   ------------------------------------------------------------------------------------------------------------------  Cardiac Enzymes Recent Labs  Lab 03/03/18 0117  TROPONINI <0.03   ------------------------------------------------------------------------------------------------------------------  RADIOLOGY:  Dg Chest Port 1 View  Result Date: 03/03/2018 CLINICAL DATA:  Short of breath EXAM: PORTABLE CHEST 1 VIEW COMPARISON:  02/27/2018, CT chest 05/13/2017 FINDINGS: Diffuse interstitial lung disease. No confluent opacity. Normal heart size. Post sternotomy changes. No pneumothorax. IMPRESSION: Similar appearance of diffuse interstitial lung disease. No acute focal airspace disease or pleural effusion is seen. Electronically Signed   By: Donavan Foil M.D.   On: 03/03/2018 01:36     ASSESSMENT AND PLAN:   *Acute hypoxic respiratory failure secondary to COPD exacerbation.  Competent of CHF. On 6 L nasal cannula.  Wean as tolerated.  Continues to be significantly short of breath.  *Acute COPD exacerbation -IV steroids, Antibiotics - Scheduled Nebulizers - Inhalers -Wean O2 as tolerated - Consult pulmonary if no  improvement  *Acute on chronic systolic congestive heart failure.  We will give stat  dose of IV Lasix.  Monitor input and output.  Repeat BMP in the morning.  *Hypertension.  Continue Coreg.  *Diabetes mellitus type 2.  Metformin held.  Sliding scale insulin.  All the records are reviewed and case discussed with Care Management/Social Workerr. Management plans discussed with the patient, family and they are in agreement.  CODE STATUS: FULL CODE  DVT Prophylaxis: SCDs  TOTAL CRITICAL CARE TIME TAKING CARE OF THIS PATIENT: 40 minutes.   POSSIBLE D/C IN 2-3 DAYS, DEPENDING ON CLINICAL CONDITION.  Leia Alf Kairee Isa M.D on 03/03/2018 at 10:23 AM  Between 7am to 6pm - Pager - [phone]  After 6pm go to [email or web address] - password [email or web address] [facility]  Office  [phone]  CC: Primary care physician; Owens Loffler, MD  Note: This dictation was prepared with Dragon dictation along with smaller phrase technology. Any transcriptional errors that result from this process are unintentional.

## 2018-03-03 NOTE — ED Triage Notes (Signed)
Pt arrives via [facility] from home with c/o sob tonight. Hx of COPD/MI/stents. Pt had used his own albuterol prior to EMS arrival without relief. He had crackles on the left on arrival to scene by EMS. He was given 1 albuterol en route and IV was established. 12lead ekg shownd 1st degree AVB. 134/84, cbg 172, 97% on 2 L.

## 2018-03-04 ENCOUNTER — Ambulatory Visit: Payer: Medicare HMO

## 2018-03-04 LAB — GLUCOSE, CAPILLARY
Glucose-Capillary: 137 mg/dL — ABNORMAL HIGH (ref 70–99)
Glucose-Capillary: 212 mg/dL — ABNORMAL HIGH (ref 70–99)
Glucose-Capillary: 124 mg/dL — ABNORMAL HIGH (ref 70–99)
Glucose-Capillary: 126 mg/dL — ABNORMAL HIGH (ref 70–99)

## 2018-03-04 LAB — BASIC METABOLIC PANEL
ANION GAP: 9 (ref 5–15)
BUN: 27 mg/dL — ABNORMAL HIGH (ref 8–23)
CO2: 30 mmol/L (ref 22–32)
Calcium: 9.9 mg/dL (ref 8.9–10.3)
Chloride: 98 mmol/L (ref 98–111)
Creatinine, Ser: 0.69 mg/dL (ref 0.61–1.24)
GFR calc Af Amer: >60 mL/min (ref >60)
GFR calc non Af Amer: >60 mL/min (ref >60)
GLUCOSE: 144 mg/dL — AB (ref 70–99)
POTASSIUM: 4.2 mmol/L (ref 3.5–5.1)
Sodium: 137 mmol/L (ref 135–145)

## 2018-03-04 MED ORDER — FUROSEMIDE 10 MG/ML IJ SOLN
60.0000 mg | Freq: Two times a day (BID) | INTRAMUSCULAR | Status: AC
  Administered 2018-03-04 (×2): 60 mg via INTRAVENOUS
  Filled 2018-03-04 (×2): qty 6

## 2018-03-04 NOTE — Progress Notes (Signed)
Pt bumped his right arm in room, small skin tear on right arm near wrist, foam applied, pt with no complaints

## 2018-03-04 NOTE — Progress Notes (Signed)
[place] at [place] NAME: Markee Matera    MR#:  [id number]  DATE OF BIRTH:  03/03/1951  SUBJECTIVE:  CHIEF COMPLAINT:   Chief Complaint  Patient presents with  . Shortness of Breath   On 5 L oxygen today.  Continues to have shortness of breath.  Increased urine output.  REVIEW OF SYSTEMS:    Review of Systems  Constitutional: Positive for malaise/fatigue. Negative for chills and fever.  HENT: Negative for sore throat.   Eyes: Negative for blurred vision, double vision and pain.  Respiratory: Positive for cough and shortness of breath. Negative for hemoptysis and wheezing.   Cardiovascular: Positive for orthopnea. Negative for chest pain, palpitations and leg swelling.  Gastrointestinal: Negative for abdominal pain, constipation, diarrhea, heartburn, nausea and vomiting.  Genitourinary: Negative for dysuria and hematuria.  Musculoskeletal: Negative for back pain and joint pain.  Skin: Negative for rash.  Neurological: Negative for sensory change, speech change, focal weakness and headaches.  Endo/Heme/Allergies: Does not bruise/bleed easily.  Psychiatric/Behavioral: Negative for depression. The patient is not nervous/anxious.     DRUG ALLERGIES:  No Known Allergies  VITALS:  Blood pressure 122/88, pulse (!) 101, temperature (!) 97.5 F (36.4 C), temperature source Oral, resp. rate (!) 24, height 5\' 11"  (1.803 m), weight 64.8 kg (142 lb 12.8 oz), SpO2 93 %.  PHYSICAL EXAMINATION:   Physical Exam  GENERAL:  67 y.o.-year-old patient lying in the bed with respiratory distress EYES: Pupils equal, round, reactive to light and accommodation. No scleral icterus. Extraocular muscles intact.  HEENT: Head atraumatic, normocephalic. Oropharynx and nasopharynx clear.  NECK:  Supple, no jugular venous distention. No thyroid enlargement, no tenderness.  LUNGS: Decreased breath sounds with bilateral wheezing and bibasilar crackles.  Increased  work of breathing CARDIOVASCULAR: S1, S2 normal. No murmurs, rubs, or gallops.  ABDOMEN: Soft, nontender, nondistended. Bowel sounds present. No organomegaly or mass.  EXTREMITIES: No cyanosis, clubbing or edema b/l.    NEUROLOGIC: Cranial nerves II through XII are intact. No focal Motor or sensory deficits b/l.   PSYCHIATRIC: The patient is alert and oriented x 3.  SKIN: No obvious rash, lesion, or ulcer.   LABORATORY PANEL:   CBC Recent Labs  Lab 03/03/18 0117  WBC 9.3  HGB 11.3*  HCT 33.6*  PLT 260   ------------------------------------------------------------------------------------------------------------------ Chemistries  Recent Labs  Lab 03/04/18 0513  NA 137  K 4.2  CL 98  CO2 30  GLUCOSE 144*  BUN 27*  CREATININE 0.69  CALCIUM 9.9   ------------------------------------------------------------------------------------------------------------------  Cardiac Enzymes Recent Labs  Lab 03/03/18 0117  TROPONINI <0.03   ------------------------------------------------------------------------------------------------------------------  RADIOLOGY:  Dg Chest Port 1 View  Result Date: 03/03/2018 CLINICAL DATA:  Short of breath EXAM: PORTABLE CHEST 1 VIEW COMPARISON:  02/27/2018, CT chest 05/13/2017 FINDINGS: Diffuse interstitial lung disease. No confluent opacity. Normal heart size. Post sternotomy changes. No pneumothorax. IMPRESSION: Similar appearance of diffuse interstitial lung disease. No acute focal airspace disease or pleural effusion is seen. Electronically Signed   By: Donavan Foil M.D.   On: 03/03/2018 01:36     ASSESSMENT AND PLAN:   *Acute hypoxic respiratory failure secondary to COPD exacerbation.  Competent of CHF. On 6 L nasal cannula.  Wean as tolerated.  Continues to be significantly short of breath.  *Acute COPD exacerbation -IV steroids, Antibiotics - Scheduled Nebulizers - Inhalers -Wean O2 as tolerated  *Acute on chronic systolic  congestive heart failure.   IV Lasix twice daily.  Monitor input and output.  Repeat BMP in the morning.  *Hypertension.  Continue Coreg.  *Diabetes mellitus type 2.  Metformin held.  Sliding scale insulin.  All the records are reviewed and case discussed with Care Management/Social Workerr. Management plans discussed with the patient, family and they are in agreement.  CODE STATUS: FULL CODE  DVT Prophylaxis: SCDs  TOTAL TIME TAKING CARE OF THIS PATIENT: 40 minutes.   POSSIBLE D/C IN 2-3 DAYS, DEPENDING ON CLINICAL CONDITION.  Leia Alf Damonica Chopra M.D on 03/04/2018 at 11:29 AM  Between 7am to 6pm - Pager - [phone]  After 6pm go to [email or web address] - password [email or web address] [facility]  Office  [phone]  CC: Primary care physician; Owens Loffler, MD  Note: This dictation was prepared with Dragon dictation along with smaller phrase technology. Any transcriptional errors that result from this process are unintentional.

## 2018-03-05 LAB — BASIC METABOLIC PANEL
ANION GAP: 10 (ref 5–15)
BUN: 31 mg/dL — ABNORMAL HIGH (ref 8–23)
CALCIUM: 10 mg/dL (ref 8.9–10.3)
CO2: 33 mmol/L — ABNORMAL HIGH (ref 22–32)
Chloride: 94 mmol/L — ABNORMAL LOW (ref 98–111)
Creatinine, Ser: 0.73 mg/dL (ref 0.61–1.24)
GFR calc Af Amer: >60 mL/min (ref >60)
Glucose, Bld: 150 mg/dL — ABNORMAL HIGH (ref 70–99)
POTASSIUM: 3.7 mmol/L (ref 3.5–5.1)
Sodium: 137 mmol/L (ref 135–145)

## 2018-03-05 LAB — GLUCOSE, CAPILLARY
Glucose-Capillary: 141 mg/dL — ABNORMAL HIGH (ref 70–99)
Glucose-Capillary: 269 mg/dL — ABNORMAL HIGH (ref 70–99)
Glucose-Capillary: 183 mg/dL — ABNORMAL HIGH (ref 70–99)
Glucose-Capillary: 130 mg/dL — ABNORMAL HIGH (ref 70–99)

## 2018-03-05 MED ORDER — BUDESONIDE 0.5 MG/2ML IN SUSP
0.5000 mg | Freq: Two times a day (BID) | RESPIRATORY_TRACT | Status: DC
  Administered 2018-03-05 – 2018-03-06 (×2): 0.5 mg via RESPIRATORY_TRACT
  Filled 2018-03-05 (×2): qty 2

## 2018-03-05 NOTE — Plan of Care (Signed)

## 2018-03-05 NOTE — Progress Notes (Signed)
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

## 2018-03-05 NOTE — Progress Notes (Signed)
[place] at [place] NAME: Tony Watson    MR#:  [id number]  DATE OF BIRTH:  1950/10/09  SUBJECTIVE:  CHIEF COMPLAINT:   Chief Complaint  Patient presents with  . Shortness of Breath   On 4 L oxygen today.  Continues to have shortness of breath.  Increased urine output. Still feels slightly worse than his baseline.  REVIEW OF SYSTEMS:    Review of Systems  Constitutional: Positive for malaise/fatigue. Negative for chills and fever.  HENT: Negative for sore throat.   Eyes: Negative for blurred vision, double vision and pain.  Respiratory: Positive for cough and shortness of breath. Negative for hemoptysis and wheezing.   Cardiovascular: Positive for orthopnea. Negative for chest pain, palpitations and leg swelling.  Gastrointestinal: Negative for abdominal pain, constipation, diarrhea, heartburn, nausea and vomiting.  Genitourinary: Negative for dysuria and hematuria.  Musculoskeletal: Negative for back pain and joint pain.  Skin: Negative for rash.  Neurological: Negative for sensory change, speech change, focal weakness and headaches.  Endo/Heme/Allergies: Does not bruise/bleed easily.  Psychiatric/Behavioral: Negative for depression. The patient is not nervous/anxious.     DRUG ALLERGIES:  No Known Allergies  VITALS:  Blood pressure 120/84, pulse 95, temperature (!) 97.5 F (36.4 C), temperature source Oral, resp. rate 16, height 5\' 11"  (1.803 m), weight 62.1 kg (137 lb), SpO2 93 %.  PHYSICAL EXAMINATION:   Physical Exam  GENERAL:  67 y.o.-year-old patient lying in the bed with respiratory distress EYES: Pupils equal, round, reactive to light and accommodation. No scleral icterus. Extraocular muscles intact.  HEENT: Head atraumatic, normocephalic. Oropharynx and nasopharynx clear.  NECK:  Supple, no jugular venous distention. No thyroid enlargement, no tenderness.  LUNGS: Decreased breath sounds with bilateral wheezing and  bibasilar crackles.  Increased work of breathing CARDIOVASCULAR: S1, S2 normal. No murmurs, rubs, or gallops.  ABDOMEN: Soft, nontender, nondistended. Bowel sounds present. No organomegaly or mass.  EXTREMITIES: No cyanosis, clubbing or edema b/l.    NEUROLOGIC: Cranial nerves II through XII are intact. No focal Motor or sensory deficits b/l.   PSYCHIATRIC: The patient is alert and oriented x 3.  SKIN: No obvious rash, lesion, or ulcer.   LABORATORY PANEL:   CBC Recent Labs  Lab 03/03/18 0117  WBC 9.3  HGB 11.3*  HCT 33.6*  PLT 260   ------------------------------------------------------------------------------------------------------------------ Chemistries  Recent Labs  Lab 03/05/18 0522  NA 137  K 3.7  CL 94*  CO2 33*  GLUCOSE 150*  BUN 31*  CREATININE 0.73  CALCIUM 10.0   ------------------------------------------------------------------------------------------------------------------  Cardiac Enzymes Recent Labs  Lab 03/03/18 0117  TROPONINI <0.03   ------------------------------------------------------------------------------------------------------------------  RADIOLOGY:  No results found.   ASSESSMENT AND PLAN:   *Acute hypoxic respiratory failure secondary to COPD exacerbation.  Competent of CHF. On 6 L nasal cannula.  Wean as tolerated.  Continues to be significantly short of breath.  *Acute COPD exacerbation -IV steroids, Antibiotics - Scheduled Nebulizers - Inhalers -Wean O2 as tolerated-target is 2 L oxygen at home.  *Acute on chronic systolic congestive heart failure.   IV Lasix twice daily.  Monitor input and output.    *Hypertension.  Continue Coreg.  *Diabetes mellitus type 2.  Metformin held.  Sliding scale insulin.  All the records are reviewed and case discussed with Care Management/Social Workerr. Management plans discussed with the patient, family and they are in agreement.  CODE STATUS: FULL CODE  DVT Prophylaxis:  SCDs  TOTAL TIME TAKING CARE OF THIS  PATIENT: 30 minutes.   POSSIBLE D/C IN 2-3 DAYS, DEPENDING ON CLINICAL CONDITION.  Vaughan Basta M.D on 03/05/2018 at 3:21 PM  Between 7am to 6pm - Pager - [phone]  After 6pm go to [email or web address] - password [email or web address] [facility]  Office  [phone]  CC: Primary care physician; Owens Loffler, MD  Note: This dictation was prepared with Dragon dictation along with smaller phrase technology. Any transcriptional errors that result from this process are unintentional.

## 2018-03-06 LAB — GLUCOSE, CAPILLARY
Glucose-Capillary: 137 mg/dL — ABNORMAL HIGH (ref 70–99)
Glucose-Capillary: 161 mg/dL — ABNORMAL HIGH (ref 70–99)

## 2018-03-06 MED ORDER — NYSTATIN 100000 UNIT/ML MT SUSP
5.0000 mL | Freq: Four times a day (QID) | OROMUCOSAL | Status: DC
  Administered 2018-03-06: 13:00:00 500000 [IU] via OROMUCOSAL
  Filled 2018-03-06: qty 5

## 2018-03-06 MED ORDER — BENEPROTEIN PO POWD: 2.0000 | Freq: Three times a day (TID) | ORAL | 0 refills | Status: DC

## 2018-03-06 MED ORDER — BUDESONIDE 0.5 MG/2ML IN SUSP: 0.5000 mg | Freq: Two times a day (BID) | RESPIRATORY_TRACT | 0 refills | Status: DC

## 2018-03-06 MED ORDER — ALBUTEROL SULFATE (2.5 MG/3ML) 0.083% IN NEBU: 2.5000 mg | INHALATION_SOLUTION | RESPIRATORY_TRACT | 0 refills | Status: DC | PRN

## 2018-03-06 MED ORDER — ASCORBIC ACID 250 MG PO TABS: 250.0000 mg | ORAL_TABLET | Freq: Two times a day (BID) | ORAL | 0 refills | Status: DC

## 2018-03-06 MED ORDER — LORAZEPAM 1 MG PO TABS: 1.0000 mg | ORAL_TABLET | Freq: Four times a day (QID) | ORAL | 0 refills | Status: DC | PRN

## 2018-03-06 MED ORDER — NYSTATIN 100000 UNIT/ML MT SUSP
5.0000 mL | Freq: Four times a day (QID) | OROMUCOSAL | 0 refills | Status: DC
Start: 2018-03-06 — End: 2018-03-17

## 2018-03-06 MED ORDER — ADULT MULTIVITAMIN W/MINERALS CH: 1.0000 | ORAL_TABLET | Freq: Every day | ORAL | 0 refills | Status: DC

## 2018-03-06 NOTE — Plan of Care (Signed)
  Problem: Education: Goal: Knowledge of General Education information will improve Description: Including pain rating scale, medication(s)/side effects and non-pharmacologic comfort measures Outcome: Progressing   Problem: Health Behavior/Discharge Planning: Goal: Ability to manage health-related needs will improve Outcome: Progressing   Problem: Clinical Measurements: Goal: Ability to maintain clinical measurements within normal limits will improve Outcome: Progressing Goal: Will remain free from infection Outcome: Progressing Goal: Diagnostic test results will improve Outcome: Progressing Goal: Respiratory complications will improve Outcome: Progressing Goal: Cardiovascular complication will be avoided Outcome: Progressing   Problem: Activity: Goal: Risk for activity intolerance will decrease Outcome: Progressing   Problem: Nutrition: Goal: Adequate nutrition will be maintained Outcome: Progressing   Problem: Coping: Goal: Level of anxiety will decrease Outcome: Progressing   Problem: Elimination: Goal: Will not experience complications related to bowel motility Outcome: Progressing Goal: Will not experience complications related to urinary retention Outcome: Progressing   Problem: Pain Managment: Goal: General experience of comfort will improve Outcome: Progressing   Problem: Safety: Goal: Ability to remain free from injury will improve Outcome: Progressing   Problem: Skin Integrity: Goal: Risk for impaired skin integrity will decrease Outcome: Progressing   Problem: Activity: Goal: Ability to tolerate increased activity will improve Outcome: Progressing Goal: Will verbalize the importance of balancing activity with adequate rest periods Outcome: Progressing   Problem: Respiratory: Goal: Ability to maintain a clear airway will improve Outcome: Progressing Goal: Levels of oxygenation will improve Outcome: Progressing Goal: Ability to maintain adequate  ventilation will improve Outcome: Progressing   

## 2018-03-06 NOTE — Progress Notes (Signed)
Pt being discharged home, discharge instructions and prescriptions reviewed with pt and wife, states understanding, pt with no complaints, pt refused o2 from room to car, states that he will put o2 on when he gets out of the hospital despite o2 order, education and risk, pt with no distress or discomfort noted

## 2018-03-07 ENCOUNTER — Telehealth: Payer: Self-pay | Admitting: Internal Medicine

## 2018-03-07 ENCOUNTER — Telehealth: Payer: Self-pay

## 2018-03-07 NOTE — Telephone Encounter (Signed)
[facility] cancelled Pt has HFU on 8/15 DK will re-access at that time. Per spouse he was on 6 liter over the weekend and discharged on 3 L. They were told he could bump down to 2L if patient tolerated. Nothing further cancelled.

## 2018-03-07 NOTE — Telephone Encounter (Signed)
Pt wife called, states that pt was hospitalized over the weekend and was D/C yesterday. She asks should pt keep the 6 min walk test scheduled on 8/2. Please advise.

## 2018-03-07 NOTE — Telephone Encounter (Signed)
Transition Care Management Follow-up Telephone Call   Date discharged?03/06/18   How have you been since you were released from the hospital? "I am feeling much better now"   Do you understand why you were in the hospital? Yes   Do you understand the discharge instructions? yes   Where were you discharged to? Home with home health   Items Reviewed:  Medications reviewed: Yes  Allergies reviewed: Yes  Dietary changes reviewed: No changes to diet  Referrals reviewed: Yes, has follow ups with cardiology and pulmonology   Functional Questionnaire:   Activities of Daily Living (ADLs):   He states they are independent in the following: dressing, bathing, grooming, toileting, feeding, ambulation   States they require assistance with the following: does not require any assistance   Any transportation issues/concerns?: Wife provides transportation    Any patient concerns? No   Confirmed importance and date/time of follow-up visits scheduled Yes  Provider Appointment booked with Dr. Lorelei Pont 03/10/18 at 1140am.   Confirmed with patient if condition begins to worsen call PCP or go to the ER.  Patient was given the office number and encouraged to call back with question or concerns.  : Yes

## 2018-03-08 ENCOUNTER — Other Ambulatory Visit: Payer: Self-pay | Admitting: Family Medicine

## 2018-03-08 NOTE — Discharge Summary (Signed)
[place] [facility] [place] NAME: Tony Watson    MR#:  [id number]  DATE OF BIRTH:  1950/09/07  DATE OF ADMISSION:  03/03/2018 ADMITTING PHYSICIAN: Harrie Foreman, MD  DATE OF DISCHARGE: 03/06/2018  2:00 PM  PRIMARY CARE PHYSICIAN: Owens Loffler, MD    ADMISSION DIAGNOSIS:  COPD exacerbation ([place]) [J44.1] Acute respiratory failure with hypoxia ([place]) [J96.01]  DISCHARGE DIAGNOSIS:  Active Problems:   Respiratory failure with hypoxia (HCC)   Pressure injury of skin   Protein-calorie malnutrition, severe   SECONDARY DIAGNOSIS:   Past Medical History:  Diagnosis Date  . Arthritis   . Asthma   . Bronchitis   . CAD (coronary artery disease)    inferior MI 11/99 (in [place]) with PCI to [facility]. last [place] 2004 [facility]): EF 50%, patent RCA stent. no obstructive disease. last myoview 2008: EF 42%, inferior infarct, no ischemia.   . 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 CHF (congestive heart failure) ([place])   . COPD (chronic obstructive pulmonary disease) ([place])   . Cough    CHRONIC  . Diabetic autonomic neuropathy associated with type 2 diabetes mellitus ([place]) 03/27/2015  . Diverticulosis 2002  . DM2 (diabetes mellitus, type 2) (HCC)    A1c 6.0% 12/11  . Dyspnea   . Edema   . GERD (gastroesophageal reflux disease)   . H/O wheezing   . HTN (hypertension)   . Hyperlipidemia   . Ischemic cardiomyopathy    mild with EF 42% on myoviews 2008.  . Medical non-compliance   . Myocardial infarction ([place])   . Nicotine dependence    chronic, active  . Pneumonia    in the past  . Seasonal allergic rhinitis   . Sleep apnea   . Smoker   . Spinal stenosis   . 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 Systolic heart failure secondary to coronary artery disease ([place]) 05/30/2012  . TIA (transient ischemic attack)    hx; now s/p PFO closure 2004 (in [place])  . Ulcer     HOSPITAL COURSE:   *Acute hypoxic respiratory failure secondary to COPD  exacerbation.  Competent of CHF. On 6 L nasal cannula.  Wean as tolerated.  Improved, back on his home O2 and ambulate in hall.  *Acute COPD exacerbation -IV steroids, Antibiotics - Scheduled Nebulizers - Inhalers -Wean O2 as tolerated-target is 2 L oxygen at home.  *Acute on chronic systolic congestive heart failure.   IV Lasix twice daily.  Monitor input and output.    *Hypertension.  Continue Coreg.  *Diabetes mellitus type 2.  Metformin held.  Sliding scale insulin.   DISCHARGE CONDITIONS:   Stable.  CONSULTS OBTAINED:  Treatment Team:  Hillary Bow, MD  DRUG ALLERGIES:  No Known Allergies  DISCHARGE MEDICATIONS:   Allergies as of 03/06/2018   No Known Allergies     Medication List    STOP taking these medications   carvedilol 3.125 MG tablet Commonly known as:  COREG   Glucosamine HCl 1000 MG Tabs   GLUCOSAMINE RELIEF 1000 MG Tabs Generic drug:  Glucosamine Sulfate   metFORMIN 500 MG 24 hr tablet Commonly known as:  GLUCOPHAGE-XR     TAKE these medications   acetaminophen 500 MG tablet Commonly known as:  TYLENOL Take 1,000 mg by mouth daily as needed for moderate pain or headache.   albuterol (2.5 MG/3ML) 0.083% nebulizer solution Commonly known as:  PROVENTIL Take 3 mLs (2.5  mg total) by nebulization every 4 (four) hours as needed for wheezing or shortness of breath.   ascorbic acid 250 MG tablet Commonly known as:  VITAMIN C Take 1 tablet (250 mg total) by mouth 2 (two) times daily.   azithromycin 250 MG tablet Commonly known as:  ZITHROMAX Take 1 tablet (250 mg total) by mouth daily.   budesonide 0.5 MG/2ML nebulizer solution Commonly known as:  PULMICORT Take 2 mLs (0.5 mg total) by nebulization 2 (two) times daily.   formoterol 20 MCG/2ML nebulizer solution Commonly known as:  PERFOROMIST Take 2 mLs (20 mcg total) by nebulization 2 (two) times daily.   gabapentin 300 MG capsule Commonly known as:  NEURONTIN Take 1 capsule  (300 mg total) by mouth 2 (two) times daily. What changed:  when to take this   ipratropium-albuterol 0.5-2.5 (3) MG/3ML Soln Commonly known as:  DUONEB TAKE 3 MLS BY NEBULIZATION EVERY 4 (FOUR) HOURS AS NEEDED.   LORazepam 1 MG tablet Commonly known as:  ATIVAN Take 1 tablet (1 mg total) by mouth every 6 (six) hours as needed for anxiety.   Magnesium 250 MG Tabs Take 250 mg by mouth every evening.   megestrol 40 MG tablet Commonly known as:  MEGACE Take 1 tablet (40 mg total) by mouth daily.   multivitamin with minerals Tabs tablet Take 1 tablet by mouth daily.   nystatin 100000 UNIT/ML suspension Commonly known as:  MYCOSTATIN Use as directed 5 mLs (500,000 Units total) in the mouth or throat 4 (four) times daily.   omeprazole 20 MG capsule Commonly known as:  PRILOSEC Take 20 mg by mouth daily.   oxymetazoline 0.05 % nasal spray Commonly known as:  AFRIN Place 1 spray into both nostrils daily as needed for congestion.   predniSONE 20 MG tablet Commonly known as:  DELTASONE Take 2 tablets (40 mg total) by mouth daily.   protein supplement Powd Take 12-18 g by mouth 3 (three) times daily with meals.   pseudoephedrine 30 MG tablet Commonly known as:  SUDAFED Take 30 mg by mouth daily as needed for congestion.   SPIRIVA HANDIHALER 18 MCG inhalation capsule Generic drug:  tiotropium INHALE 1 CAPSULE VIA HANDIHALER ONCE DAILY AT THE SAME TIME EVERY DAY   SYMBICORT 160-4.5 MCG/ACT inhaler Generic drug:  budesonide-formoterol INHALE 2 PUFFS INTO THE LUNGS 2 (TWO) TIMES DAILY.   tiZANidine 4 MG tablet Commonly known as:  ZANAFLEX TAKE 1 TABLET (4 MG TOTAL) BY MOUTH NIGHTLY.   Vitamin D 2000 units Caps Take 2,000 Units by mouth every evening.        DISCHARGE INSTRUCTIONS:    Follow with PMD in 1-2 weeks.  If you experience worsening of your admission symptoms, develop shortness of breath, life threatening emergency, suicidal or homicidal thoughts you must  seek medical attention immediately by calling 911 or calling your MD immediately  if symptoms less severe.  You Must read complete instructions/literature along with all the possible adverse reactions/side effects for all the Medicines you take and that have been prescribed to you. Take any new Medicines after you have completely understood and accept all the possible adverse reactions/side effects.   Please note  You were cared for by a hospitalist during your hospital stay. If you have any questions about your discharge medications or the care you received while you were in the hospital after you are discharged, you can call the unit and asked to speak with the hospitalist on call if the hospitalist that  took care of you is not available. Once you are discharged, your primary care physician will handle any further medical issues. Please note that NO REFILLS for any discharge medications will be authorized once you are discharged, as it is imperative that you return to your primary care physician (or establish a relationship with a primary care physician if you do not have one) for your aftercare needs so that they can reassess your need for medications and monitor your lab values.    Today   CHIEF COMPLAINT:   Chief Complaint  Patient presents with  . Shortness of Breath    HISTORY OF PRESENT ILLNESS:  Jamont Mellin  is a 67 y.o. male with a known history of  CHF, CAD status post MI and PCI, COPD, diabetes and hypertension presents to the emergency department complaining of shortness of breath.  The patient was seen here 4 days ago and discharged from the emergency department with antibiotics and steroids for COPD exacerbation.  He returns tonight with shortness of breath.  He received multiple breathing treatments as well as steroids and antibiotics for presumed pneumonia but continued to have respiratory distress.  Oxygen saturations were below 87% on room air but improved with 2 L of  oxygen via nasal cannula.  The patient displayed significant anxiety and increased work of breathing which prompted the emergency department staff to call the hospitalist service for admission.    VITAL SIGNS:  Blood pressure 119/64, pulse (!) 101, temperature 97.7 F (36.5 C), temperature source Oral, resp. rate 16, height _0  (1.803 m), weight 62.1 kg (137 lb), SpO2 95 %.  I/O:  No intake or output data in the 24 hours ending 03/08/18 0106  PHYSICAL EXAMINATION:  GENERAL:  67 y.o.-year-old patient lying in the bed with no acute distress.  EYES: Pupils equal, round, reactive to light and accommodation. No scleral icterus. Extraocular muscles intact.  HEENT: Head atraumatic, normocephalic. Oropharynx and nasopharynx clear.  NECK:  Supple, no jugular venous distention. No thyroid enlargement, no tenderness.  LUNGS: Normal breath sounds bilaterally, no wheezing, rales,rhonchi or crepitation. No use of accessory muscles of respiration.  CARDIOVASCULAR: S1, S2 normal. No murmurs, rubs, or gallops.  ABDOMEN: Soft, non-tender, non-distended. Bowel sounds present. No organomegaly or mass.  EXTREMITIES: No pedal edema, cyanosis, or clubbing.  NEUROLOGIC: Cranial nerves II through XII are intact. Muscle strength 5/5 in all extremities. Sensation intact. Gait not checked.  PSYCHIATRIC: The patient is alert and oriented x 3.  SKIN: No obvious rash, lesion, or ulcer.   DATA REVIEW:   CBC Recent Labs  Lab 03/03/18 0117  WBC 9.3  HGB 11.3*  HCT 33.6*  PLT 260    Chemistries  Recent Labs  Lab 03/05/18 0522  NA 137  K 3.7  CL 94*  CO2 33*  GLUCOSE 150*  BUN 31*  CREATININE 0.73  CALCIUM 10.0    Cardiac Enzymes Recent Labs  Lab 03/03/18 0117  TROPONINI <0.03    Microbiology Results  Results for orders placed or performed during the hospital encounter of 05/19/17  Culture, bal-quantitative     Status: Abnormal   Collection Time: 05/19/17  2:35 PM  Result Value Ref Range  Status   Specimen Description BRONCHIAL ALVEOLAR LAVAGE  Final   Special Requests NONE  Final   Gram Stain   Final    FEW WBC PRESENT,BOTH PMN AND MONONUCLEAR NO ORGANISMS SEEN    Culture (A)  Final    40,000 COLONIES/mL Consistent with normal respiratory  flora. Performed at [facility], [place] [address] [place], [place] [address]    Report Status 05/22/2017 FINAL  Final  Fungus Culture With Stain     Status: None   Collection Time: 05/19/17  2:35 PM  Result Value Ref Range Status   Fungus Stain Final report  Final   Fungus (Mycology) Culture Final report  Final    Comment: (NOTE) Performed At: [facility] [place], [place] [id number] Rush Farmer MD PY:[id number]    Fungal Source BRONCHIAL ALVEOLAR LAVAGE  Final  Acid Fast Smear (AFB)     Status: None   Collection Time: 05/19/17  2:35 PM  Result Value Ref Range Status   AFB Specimen Processing Concentration  Final   Acid Fast Smear Negative  Final    Comment: (NOTE) Performed At: [facility] [place], [place] [id number] Lindon Romp MD JA:[id number]    Source (AFB) BRONCHIAL ALVEOLAR LAVAGE  Final  Acid Fast Culture with reflexed sensitivities     Status: None   Collection Time: 05/19/17  2:35 PM  Result Value Ref Range Status   Acid Fast Culture Negative  Final    Comment: (NOTE) No acid fast bacilli isolated after 6 weeks. Performed At: [facility] [place], [place] [id number] Rush Farmer MD OX:[id number]    Source of Sample BRONCHIAL ALVEOLAR LAVAGE  Final  Fungus Culture Result     Status: None   Collection Time: 05/19/17  2:35 PM  Result Value Ref Range Status   Result 1 Comment  Final    Comment: (NOTE) KOH/Calcofluor preparation:  no fungus observed. Performed At: [facility] [place], [place] [id number] Rush Farmer MD WL:[id number]   Fungal organism reflex     Status: None   Collection Time:  05/19/17  2:35 PM  Result Value Ref Range Status   Fungal result 1 Comment  Final    Comment: (NOTE) No yeast or mold isolated after 4 weeks. Performed At: [facility] [place], [place] [id number] Rush Farmer MD EH:[id number]     RADIOLOGY:  No results found.  EKG:   Orders placed or performed during the hospital encounter of 03/03/18  . ED EKG  . ED EKG  . ED EKG  . ED EKG  . EKG 12-Lead  . EKG 12-Lead      Management plans discussed with the patient, family and they are in agreement.  CODE STATUS:  Code Status History    Date Active Date Inactive Code Status Order ID Comments User Context   03/03/2018 0328 03/06/2018 1716 Full Code [id number]  Harrie Foreman, MD Inpatient      TOTAL TIME TAKING CARE OF THIS PATIENT: 35 minutes.    Vaughan Basta M.D on 03/08/2018 at 1:06 AM  Between 7am to 6pm - Pager - [phone]  After 6pm go to [email or web address] - password [email or web address] [facility]  Office  [phone]  CC: Primary care physician; Owens Loffler, MD   Note: This dictation was prepared with Dragon dictation along with smaller phrase technology. Any transcriptional errors that result from this process are unintentional.

## 2018-03-10 ENCOUNTER — Ambulatory Visit (INDEPENDENT_AMBULATORY_CARE_PROVIDER_SITE_OTHER): Payer: Medicare HMO | Admitting: Family Medicine

## 2018-03-10 ENCOUNTER — Encounter: Payer: Self-pay | Admitting: *Deleted

## 2018-03-10 ENCOUNTER — Encounter: Payer: Self-pay | Admitting: Family Medicine

## 2018-03-10 VITALS — BP 90/60 | HR 91 | Temp 97.5°F | Ht 71.0 in | Wt 136.5 lb

## 2018-03-10 DIAGNOSIS — J9611 Chronic respiratory failure with hypoxia: Secondary | ICD-10-CM

## 2018-03-10 DIAGNOSIS — I5041 Acute combined systolic (congestive) and diastolic (congestive) heart failure: Secondary | ICD-10-CM | POA: Diagnosis not present

## 2018-03-10 DIAGNOSIS — I252 Old myocardial infarction: Secondary | ICD-10-CM

## 2018-03-10 DIAGNOSIS — C9310 Chronic myelomonocytic leukemia not having achieved remission: Secondary | ICD-10-CM | POA: Diagnosis not present

## 2018-03-10 DIAGNOSIS — E43 Unspecified severe protein-calorie malnutrition: Secondary | ICD-10-CM | POA: Diagnosis not present

## 2018-03-10 DIAGNOSIS — I2510 Atherosclerotic heart disease of native coronary artery without angina pectoris: Secondary | ICD-10-CM

## 2018-03-10 MED ORDER — LORAZEPAM 0.5 MG PO TABS: 0.5000 mg | ORAL_TABLET | Freq: Four times a day (QID) | ORAL | 1 refills | Status: DC | PRN

## 2018-03-10 NOTE — Progress Notes (Signed)
Dr. Frederico Hamman T. Davion Meara, MD, [facility] [place], [address] Phone: [phone] Fax: [phone]  03/10/2018  Patient: Tony Watson, MRN: [id number], DOB: Mar 19, 1951, 67 y.o.  Primary Physician:  Owens Loffler, MD   Chief Complaint  Patient presents with  . Hospitalization Follow-up    COPD exacerbation   Subjective:   LADARIOUS Watson is a 67 y.o. very pleasant male patient who presents with the following:  DATE OF ADMISSION:  03/03/2018    ADMITTING PHYSICIAN: Harrie Foreman, MD DATE OF DISCHARGE: 03/06/2018  2:00 PM  COPD, ARF. Now on home 02. Have all inhalers 2 L 02 at home Pulmicort Formoterol Albuterol - stopped his symbicort and spiriva.  According to he and his wife, they were having trouble paying for these, and additionally he did not feel like he was getting all of the medication in when he was taking a deep breath with his inhalers.  He previously had been on both Spiriva and Symbicort.  He is also on 2 L of oxygen currently.  Initially he was seen 4 days prior to his admission at the ER and was sent home with antibiotics and steroids. He had significant and was Distress in the ER therefore admitted.  8/15 appt with Dr. Mortimer Fries  Protein calorie malnutrition Albumin 3.1 His appetite is diminished quite a bit His weight is down 136 pounds.  Notably worsened congestive heart failure.  The patient's ejection fraction was 25 to 30% while in the hospital. He does have a known history of prior MI. Prior PCI He has seen Dr. Fletcher Anon in the past, but it has been 5 years. He has stopped various medications and missed follow-ups through the years.  Past Medical History, Surgical History, Social History, Family History, Problem List, Medications, and Allergies have been reviewed and updated if relevant.  Patient Active Problem List   Diagnosis Date Noted  . 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 COPD, severe 03/23/2011    Priority: High  . Type 2 diabetes mellitus with vascular disease ([place]) 01/02/2010    Priority: High  . Respiratory failure with hypoxia ([place]) 03/03/2018  . Pressure injury of skin 03/03/2018  . Protein-calorie malnutrition, severe 03/03/2018  . Diabetic autonomic neuropathy associated with type 2 diabetes mellitus ([place]) 03/27/2015  . History of inferior MI (myocardial infarction) 10/14/2013  . CAD (coronary artery disease)   . HTN (hypertension)   . History of noncompliance with medical treatment 05/30/2012  . Systolic heart failure secondary to coronary artery disease ([place]) 05/30/2012  . Other emphysema ([place]) 03/23/2011  . ALLERGIC RHINITIS CAUSE UNSPECIFIED 07/31/2010  . TOBACCO USE 04/17/2009  . TRANSIENT ISCHEMIC ATTACKS, HX OF 04/17/2009  . Hyperlipidemia 12/19/2008  . HYPERTENSION, UNSPECIFIED 12/19/2008    Past Medical History:  Diagnosis Date  . Arthritis   . Asthma   . Bronchitis   . CAD (coronary artery disease)    inferior MI 11/99 (in [place]) with PCI to [facility]. last [place] 2004 [facility]): EF 50%, patent RCA stent. no obstructive disease. last myoview 2008: EF 42%, inferior infarct, no ischemia.   . 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 CHF (congestive heart failure) ([place])   . COPD (chronic obstructive pulmonary disease) ([place])   . Cough    CHRONIC  . Diabetic autonomic neuropathy associated with type 2 diabetes mellitus ([place]) 03/27/2015  . Diverticulosis 2002  .  DM2 (diabetes mellitus, type 2) ([facility])    A1c 6.0% 12/11  . Dyspnea   . Edema   . GERD (gastroesophageal reflux disease)   . H/O wheezing   . HTN (hypertension)   . Hyperlipidemia   . Ischemic cardiomyopathy    mild with EF 42% on myoviews 2008.  . Medical non-compliance   . Myocardial infarction ([place])   . Nicotine dependence    chronic, active  . Pneumonia    in the past  . Seasonal allergic rhinitis   . Sleep apnea   .  Smoker   . Spinal stenosis   . 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 Systolic heart failure secondary to coronary artery disease ([place]) 05/30/2012  . TIA (transient ischemic attack)    hx; now s/p PFO closure 2004 (in [place])  . Ulcer     Past Surgical History:  Procedure Laterality Date  . BACK SURGERY     patient denies-just lumbar punctures  . BRONCHOSCOPY    . CARDIAC CATHETERIZATION  11/99   CI per [facility]  . CATARACT EXTRACTION W/PHACO Right 11/24/2016   Procedure: CATARACT EXTRACTION PHACO AND INTRAOCULAR LENS PLACEMENT (IOC);  Surgeon: Birder Robson, MD;  Location: [facility] ORS;  Service: Ophthalmology;  Laterality: Right;  [place] 1:04.3 AP% 22.3 CDE 14.35 Fluid pack lot # [id number]  . CATARACT EXTRACTION W/PHACO Left 12/29/2016   Procedure: CATARACT EXTRACTION PHACO AND INTRAOCULAR LENS PLACEMENT (IOC);  Surgeon: Birder Robson, MD;  Location: [facility] ORS;  Service: Ophthalmology;  Laterality: Left;  [place] 00:52 AP% 18.5 CDE 9.67 Fluid pack lot # [id number]  . COLONOSCOPY    . COLONOSCOPY WITH PROPOFOL N/A 05/25/2017   Procedure: COLONOSCOPY WITH PROPOFOL;  Surgeon: Jonathon Bellows, MD;  Location: [facility] ENDOSCOPY;  Service: Gastroenterology;  Laterality: N/A;  . CORONARY ANGIOPLASTY     STENT  . CORONARY ARTERY BYPASS GRAFT     stent  . ESOPHAGOGASTRODUODENOSCOPY (EGD) WITH PROPOFOL N/A 05/25/2017   Procedure: ESOPHAGOGASTRODUODENOSCOPY (EGD) WITH PROPOFOL;  Surgeon: Jonathon Bellows, MD;  Location: [facility] ENDOSCOPY;  Service: Gastroenterology;  Laterality: N/A;  . EYE SURGERY    . FLEXIBLE BRONCHOSCOPY N/A 05/19/2017   Procedure: FLEXIBLE BRONCHOSCOPY;  Surgeon: Wilhelmina Mcardle, MD;  Location: [facility] ORS;  Service: Pulmonary;  Laterality: N/A;  . open heart surgery  2004   PFO repair  . UPPER GI ENDOSCOPY      Social History   Socioeconomic History  . Marital status: Married    Spouse name: Not on file  . Number of children: Not on file  . Years of education: Not on file  .  Highest education level: Not on file  Occupational History  . Not on file  Social Needs  . Financial resource strain: Not on file  . Food insecurity:    Worry: Not on file    Inability: Not on file  . Transportation needs:    Medical: Not on file    Non-medical: Not on file  Tobacco Use  . Smoking status: Former Smoker    Packs/day: 0.50    Years: 45.00    Pack years: 22.50    Types: Cigarettes    Last attempt to quit: 01/25/2018    Years since quitting: 0.1  . Smokeless tobacco: Never Used  . Tobacco comment: 1 ppd +40 years  Substance and Sexual Activity  . Alcohol use: Yes    Alcohol/week: 0.0 oz    Comment: weekly but last dose 1 month  . Drug  use: No  . Sexual activity: Not on file  Lifestyle  . Physical activity:    Days per week: Not on file    Minutes per session: Not on file  . Stress: Not on file  Relationships  . Social connections:    Talks on phone: Not on file    Gets together: Not on file    Attends religious service: Not on file    Active member of club or organization: Not on file    Attends meetings of clubs or organizations: Not on file    Relationship status: Not on file  . Intimate partner violence:    Fear of current or ex partner: Not on file    Emotionally abused: Not on file    Physically abused: Not on file    Forced sexual activity: Not on file  Other Topics Concern  . Not on file  Social History Narrative   Married, 1 daughter. 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 Lives in [place], [place]    Family History  Problem Relation Age of Onset  . Coronary artery disease Unknown        family hx  . Breast cancer Unknown        1st egree relative <50    No Known Allergies  Medication list reviewed and updated in full in [place].   GEN: No acute illnesses, no fevers, chills. GI: No n/v/d, eating normally Pulm: + SOB but improving Interactive and getting along well at home. Otherwise, the pertinent  positives and negatives are listed above and in the HPI, otherwise a full review of systems has been reviewed and is negative unless noted positive.   Objective:   BP 90/60   Pulse 91   Temp (!) 97.5 F (36.4 C) (Oral)   Ht _0  (1.803 m)   Wt 136 lb 8 oz (61.9 kg)   SpO2 91%   BMI 19.04 kg/m    GEN: WDWN, NAD, Non-toxic, A & O x 3, cachectic appearing HEENT: Atraumatic, Normocephalic. Neck supple. No masses, No LAD. Ears and Nose: No external deformity. CV: RRR, No M/G/R. No JVD. No thrill. No extra heart sounds. PULM: B wheezes, crackles, rhonchi. No retractions. No resp. distress. No accessory muscle use. ABD: S, NT, ND, + BS, No rebound, No HSM  EXTR: No c/c/e NEURO Normal gait.  PSYCH: Normally interactive. Conversant. Not depressed or anxious appearing.  Calm demeanor.   Laboratory and Imaging Data: Dg Chest 2 View  Result Date: 02/27/2018 CLINICAL DATA:  Increasing shortness of breath. History of COPD on home oxygen. Former smoker. EXAM: CHEST - 2 VIEW COMPARISON:  CT chest 05/13/2017.  Chest 07/15/2015 FINDINGS: Normal heart size and pulmonary vascularity. Postoperative changes in the mediastinum. Coarse interstitial infiltrates diffusely throughout the lungs. As better seen on previous chest CT, this appears to represent a combination of bronchiectasis and reticulonodular interstitial lung disease. No focal consolidation. No atelectasis or volume loss. No blunting of costophrenic angles. No pneumothorax. Increased lung volumes consistent with emphysema. No blunting of costophrenic angles. No pneumothorax. Mediastinal contours appear intact. IMPRESSION: Diffuse interstitial lung disease consistent with chronic process. No focal consolidation. Electronically Signed   By: Lucienne Capers M.D.   On: 02/27/2018 21:46   Dg Chest Port 1 View  Result Date: 03/03/2018 CLINICAL DATA:  Short of breath EXAM: PORTABLE CHEST 1 VIEW COMPARISON:  02/27/2018, CT chest 05/13/2017 FINDINGS:  Diffuse interstitial lung disease. No confluent opacity. Normal heart size.  Post sternotomy changes. No pneumothorax. IMPRESSION: Similar appearance of diffuse interstitial lung disease. No acute focal airspace disease or pleural effusion is seen. Electronically Signed   By: Donavan Foil M.D.   On: 03/03/2018 01:36    Results for orders placed or performed during the hospital encounter of 15/40/08  Basic metabolic panel  Result Value Ref Range   Sodium 137 135 - 145 mmol/L   Potassium 4.9 3.5 - 5.1 mmol/L   Chloride 101 98 - 111 mmol/L   CO2 26 22 - 32 mmol/L   Glucose, Bld 138 (H) 70 - 99 mg/dL   BUN 15 8 - 23 mg/dL   Creatinine, Ser 0.58 (L) 0.61 - 1.24 mg/dL   Calcium 9.5 8.9 - 10.3 mg/dL   GFR calc non Af Amer >60 >60 mL/min   GFR calc Af Amer >60 >60 mL/min   Anion gap 10 5 - 15  CBC  Result Value Ref Range   WBC 9.3 3.8 - 10.6 K/uL   RBC 3.43 (L) 4.40 - 5.90 MIL/uL   Hemoglobin 11.3 (L) 13.0 - 18.0 g/dL   HCT 33.6 (L) 40.0 - 52.0 %   MCV 97.9 80.0 - 100.0 fL   MCH 32.9 26.0 - 34.0 pg   MCHC 33.6 32.0 - 36.0 g/dL   RDW 16.8 (H) 11.5 - 14.5 %   Platelets 260 150 - 440 K/uL  Brain natriuretic peptide  Result Value Ref Range   B Natriuretic Peptide 1,011.0 (H) 0.0 - 100.0 pg/mL  Troponin I  Result Value Ref Range   Troponin I <0.03 <0.03 ng/mL  TSH  Result Value Ref Range   TSH 1.930 0.350 - 4.500 uIU/mL  Hemoglobin A1c  Result Value Ref Range   Hgb A1c MFr Bld 6.3 (H) 4.8 - 5.6 %   Mean Plasma Glucose 134.11 mg/dL  Glucose, capillary  Result Value Ref Range   Glucose-Capillary 194 (H) 70 - 99 mg/dL  Glucose, capillary  Result Value Ref Range   Glucose-Capillary 129 (H) 70 - 99 mg/dL  Basic metabolic panel  Result Value Ref Range   Sodium 137 135 - 145 mmol/L   Potassium 4.2 3.5 - 5.1 mmol/L   Chloride 98 98 - 111 mmol/L   CO2 30 22 - 32 mmol/L   Glucose, Bld 144 (H) 70 - 99 mg/dL   BUN 27 (H) 8 - 23 mg/dL   Creatinine, Ser 0.69 0.61 - 1.24 mg/dL   Calcium  9.9 8.9 - 10.3 mg/dL   GFR calc non Af Amer >60 >60 mL/min   GFR calc Af Amer >60 >60 mL/min   Anion gap 9 5 - 15  Glucose, capillary  Result Value Ref Range   Glucose-Capillary 164 (H) 70 - 99 mg/dL  Glucose, capillary  Result Value Ref Range   Glucose-Capillary 137 (H) 70 - 99 mg/dL  Glucose, capillary  Result Value Ref Range   Glucose-Capillary 212 (H) 70 - 99 mg/dL  Glucose, capillary  Result Value Ref Range   Glucose-Capillary 124 (H) 70 - 99 mg/dL  Basic metabolic panel  Result Value Ref Range   Sodium 137 135 - 145 mmol/L   Potassium 3.7 3.5 - 5.1 mmol/L   Chloride 94 (L) 98 - 111 mmol/L   CO2 33 (H) 22 - 32 mmol/L   Glucose, Bld 150 (H) 70 - 99 mg/dL   BUN 31 (H) 8 - 23 mg/dL   Creatinine, Ser 0.73 0.61 - 1.24 mg/dL   Calcium 10.0 8.9 -  10.3 mg/dL   GFR calc non Af Amer >60 >60 mL/min   GFR calc Af Amer >60 >60 mL/min   Anion gap 10 5 - 15  Glucose, capillary  Result Value Ref Range   Glucose-Capillary 126 (H) 70 - 99 mg/dL  Glucose, capillary  Result Value Ref Range   Glucose-Capillary 141 (H) 70 - 99 mg/dL  Glucose, capillary  Result Value Ref Range   Glucose-Capillary 269 (H) 70 - 99 mg/dL  Glucose, capillary  Result Value Ref Range   Glucose-Capillary 183 (H) 70 - 99 mg/dL  Glucose, capillary  Result Value Ref Range   Glucose-Capillary 130 (H) 70 - 99 mg/dL  Glucose, capillary  Result Value Ref Range   Glucose-Capillary 137 (H) 70 - 99 mg/dL  Glucose, capillary  Result Value Ref Range   Glucose-Capillary 161 (H) 70 - 99 mg/dL  ECHOCARDIOGRAM COMPLETE  Result Value Ref Range   Weight 2,208 oz   Height 71 in   BP 125/87 mmHg     Assessment and Plan:   Chronic respiratory failure with hypoxia (HCC)  Acute combined systolic and diastolic heart failure (HCC) - Plan: Ambulatory referral to Cardiology  Chronic myelomonocytic leukemia not having achieved remission (H[facility])  Protein-calorie malnutrition, severe  History of inferior MI (myocardial  infarction)  Coronary artery disease involving native coronary artery of native heart without angina pectoris  CML, 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  Acute on chronic respiratory failure with advanced COPD.  Intermittent compliant with medication use.  Now changed to all nebulizer medications.  I appreciate pulmonology help in this case as well.  Ejection fraction 25% in a patient with long-standing heart problems.  Think the biggest issue here is his global malnutrition along with his CML.  He has multiple hurdles, and I think that he and his wife understand that long-term these issues will not be curable.  Follow-up: Return in about 3 months (around 06/10/2018).  Meds ordered this encounter  Medications  . DISCONTD: LORazepam (ATIVAN) 0.5 MG tablet    Sig: Take 1 tablet (0.5 mg total) by mouth every 6 (six) hours as needed for anxiety.    Dispense:  30 tablet    Refill:  1  . LORazepam (ATIVAN) 0.5 MG tablet    Sig: Take 1 tablet (0.5 mg total) by mouth every 6 (six) hours as needed for anxiety.    Dispense:  30 tablet    Refill:  1   Orders Placed This Encounter  Procedures  . Ambulatory referral to Cardiology    Signed,  Frederico Hamman T. Kainat Pizana, MD   Allergies as of 03/10/2018   No Known Allergies     Medication List        Accurate as of 03/10/18 11:59 PM. Always use your most recent med list.          acetaminophen 500 MG tablet Commonly known as:  TYLENOL Take 1,000 mg by mouth daily as needed for moderate pain or headache.   albuterol (2.5 MG/3ML) 0.083% nebulizer solution Commonly known as:  PROVENTIL Take 3 mLs (2.5 mg total) by nebulization every 4 (four) hours as needed for wheezing or shortness of breath.   ascorbic acid 250 MG tablet Commonly known as:  VITAMIN C Take 1 tablet (250 mg total) by mouth 2 (two) times daily.   budesonide 0.5 MG/2ML nebulizer solution Commonly known as:  PULMICORT Take 2 mLs (0.5 mg total) by  nebulization 2 (two) times daily.   formoterol 20  MCG/2ML nebulizer solution Commonly known as:  PERFOROMIST Take 2 mLs (20 mcg total) by nebulization 2 (two) times daily.   gabapentin 300 MG capsule Commonly known as:  NEURONTIN Take 300 mg by mouth at bedtime.   gabapentin 300 MG capsule Commonly known as:  NEURONTIN TAKE 1 CAPSULE (300 MG TOTAL) BY MOUTH AT BEDTIME.   LORazepam 0.5 MG tablet Commonly known as:  ATIVAN Take 1 tablet (0.5 mg total) by mouth every 6 (six) hours as needed for anxiety.   megestrol 40 MG tablet Commonly known as:  MEGACE Take 1 tablet (40 mg total) by mouth daily.   multivitamin with minerals Tabs tablet Take 1 tablet by mouth daily.   nystatin 100000 UNIT/ML suspension Commonly known as:  MYCOSTATIN Use as directed 5 mLs (500,000 Units total) in the mouth or throat 4 (four) times daily.   omeprazole 20 MG capsule Commonly known as:  PRILOSEC Take 20 mg by mouth daily.   oxymetazoline 0.05 % nasal spray Commonly known as:  AFRIN Place 1 spray into both nostrils daily as needed for congestion.   protein supplement Powd Take 12-18 g by mouth 3 (three) times daily with meals.   pseudoephedrine 30 MG tablet Commonly known as:  SUDAFED Take 30 mg by mouth daily as needed for congestion.   tiZANidine 4 MG tablet Commonly known as:  ZANAFLEX TAKE 1 TABLET (4 MG TOTAL) BY MOUTH NIGHTLY.   Vitamin D 2000 units Caps Take 2,000 Units by mouth every evening.

## 2018-03-11 ENCOUNTER — Ambulatory Visit: Payer: Medicare HMO

## 2018-03-11 ENCOUNTER — Encounter: Payer: Self-pay | Admitting: Family Medicine

## 2018-03-17 ENCOUNTER — Telehealth: Payer: Self-pay | Admitting: Family Medicine

## 2018-03-17 MED ORDER — NYSTATIN 100000 UNIT/ML MT SUSP
5.0000 mL | Freq: Four times a day (QID) | OROMUCOSAL | 1 refills | Status: DC
Start: 2018-03-17 — End: 2018-03-23

## 2018-03-17 NOTE — Telephone Encounter (Signed)
Refilled

## 2018-03-17 NOTE — Telephone Encounter (Signed)
Nystatin suspension is on med list given to pt by Hospitalist Dr V. Pt had [place] on 03/10/18.Please advise.

## 2018-03-17 NOTE — Telephone Encounter (Signed)
Copied from [place] [id number]. Topic: Quick Communication - See Telephone Encounter >> Mar 17, 2018  9:22 AM Conception Chancy, NT wrote: CRM for notification. See Telephone encounter for: 03/17/18.  Patient is calling and states that when he left the hospital on 03/06/18 he was prescribed a medication for thrush. Patient is unsure of the name but states he is out of that medication and the thrush did not go away. Patient would like to know if Dr. Lorelei Pont can call something in. Please advise.  [id number] - [place], [place] - [address] [place] [address] Phone: [phone] Fax: [phone]

## 2018-03-23 ENCOUNTER — Other Ambulatory Visit: Payer: Self-pay | Admitting: *Deleted

## 2018-03-23 ENCOUNTER — Telehealth: Payer: Self-pay | Admitting: Internal Medicine

## 2018-03-23 MED ORDER — GABAPENTIN 300 MG PO CAPS: 300.0000 mg | ORAL_CAPSULE | Freq: Every day | ORAL | 1 refills | Status: DC

## 2018-03-23 MED ORDER — TIZANIDINE HCL 4 MG PO TABS: 4.0000 mg | ORAL_TABLET | Freq: Every evening | ORAL | 3 refills | Status: DC

## 2018-03-23 MED ORDER — NYSTATIN 100000 UNIT/ML MT SUSP: 5.0000 mL | Freq: Four times a day (QID) | OROMUCOSAL | 1 refills | Status: DC

## 2018-03-23 NOTE — Telephone Encounter (Signed)
Returned call to patient. SMW walk test was previously cancelled due to pt being in [place]. Office visit for 8/15 cancelled in error. Appt has been rescheduled 03/28/18. Nothing further needed.

## 2018-03-23 NOTE — Telephone Encounter (Signed)
Last office visit 03/10/2018 for hospital follow up.  Last refilled 03/08/2018 for #90 with no refills.  Tizanidine 03/21/2017 for #90 with 3 refills & Nystatin 03/17/2018 for 473 ml with 1 refill.  Ok to refill.    This is a request from [facility] [place].

## 2018-03-23 NOTE — Telephone Encounter (Signed)
New Message  Pt verbalized wondering why his appt was cancelled by provider.  Please f/u

## 2018-03-24 ENCOUNTER — Inpatient Hospital Stay
Admission: EM | Admit: 2018-03-24 | Discharge: 2018-03-28 | DRG: 190 | Disposition: A | Discharge status: Home / Self Care | Payer: Medicare HMO | Attending: Internal Medicine | Admitting: Internal Medicine

## 2018-03-24 ENCOUNTER — Emergency Department: Payer: Medicare HMO

## 2018-03-24 ENCOUNTER — Other Ambulatory Visit: Payer: Self-pay

## 2018-03-24 ENCOUNTER — Ambulatory Visit: Payer: Medicare HMO | Admitting: Internal Medicine

## 2018-03-24 DIAGNOSIS — Z79899 Other long term (current) drug therapy: Secondary | ICD-10-CM | POA: Diagnosis not present

## 2018-03-24 DIAGNOSIS — E1143 Type 2 diabetes mellitus with diabetic autonomic (poly)neuropathy: Secondary | ICD-10-CM | POA: Diagnosis present

## 2018-03-24 DIAGNOSIS — J9621 Acute and chronic respiratory failure with hypoxia: Secondary | ICD-10-CM | POA: Diagnosis present

## 2018-03-24 DIAGNOSIS — I5023 Acute on chronic systolic (congestive) heart failure: Secondary | ICD-10-CM | POA: Diagnosis present

## 2018-03-24 DIAGNOSIS — Z9119 Patient's noncompliance with other medical treatment and regimen: Secondary | ICD-10-CM

## 2018-03-24 DIAGNOSIS — Z87891 Personal history of nicotine dependence: Secondary | ICD-10-CM

## 2018-03-24 DIAGNOSIS — Z7951 Long term (current) use of inhaled steroids: Secondary | ICD-10-CM

## 2018-03-24 DIAGNOSIS — I2510 Atherosclerotic heart disease of native coronary artery without angina pectoris: Secondary | ICD-10-CM | POA: Diagnosis present

## 2018-03-24 DIAGNOSIS — I493 Ventricular premature depolarization: Secondary | ICD-10-CM | POA: Diagnosis not present

## 2018-03-24 DIAGNOSIS — G4730 Sleep apnea, unspecified: Secondary | ICD-10-CM | POA: Diagnosis present

## 2018-03-24 DIAGNOSIS — I110 Hypertensive heart disease with heart failure: Secondary | ICD-10-CM | POA: Diagnosis present

## 2018-03-24 DIAGNOSIS — I5022 Chronic systolic (congestive) heart failure: Secondary | ICD-10-CM | POA: Diagnosis not present

## 2018-03-24 DIAGNOSIS — I252 Old myocardial infarction: Secondary | ICD-10-CM | POA: Diagnosis not present

## 2018-03-24 DIAGNOSIS — Z9981 Dependence on supplemental oxygen: Secondary | ICD-10-CM | POA: Diagnosis not present

## 2018-03-24 DIAGNOSIS — J441 Chronic obstructive pulmonary disease with (acute) exacerbation: Secondary | ICD-10-CM | POA: Diagnosis not present

## 2018-03-24 DIAGNOSIS — Z955 Presence of coronary angioplasty implant and graft: Secondary | ICD-10-CM

## 2018-03-24 DIAGNOSIS — Z681 Body mass index (BMI) 19 or less, adult: Secondary | ICD-10-CM

## 2018-03-24 DIAGNOSIS — Z951 Presence of aortocoronary bypass graft: Secondary | ICD-10-CM

## 2018-03-24 DIAGNOSIS — E43 Unspecified severe protein-calorie malnutrition: Secondary | ICD-10-CM | POA: Diagnosis present

## 2018-03-24 DIAGNOSIS — J449 Chronic obstructive pulmonary disease, unspecified: Secondary | ICD-10-CM | POA: Diagnosis not present

## 2018-03-24 DIAGNOSIS — K219 Gastro-esophageal reflux disease without esophagitis: Secondary | ICD-10-CM | POA: Diagnosis present

## 2018-03-24 DIAGNOSIS — Z8673 Personal history of transient ischemic attack (TIA), and cerebral infarction without residual deficits: Secondary | ICD-10-CM

## 2018-03-24 DIAGNOSIS — J9611 Chronic respiratory failure with hypoxia: Secondary | ICD-10-CM | POA: Diagnosis not present

## 2018-03-24 DIAGNOSIS — E785 Hyperlipidemia, unspecified: Secondary | ICD-10-CM | POA: Diagnosis present

## 2018-03-24 DIAGNOSIS — I255 Ischemic cardiomyopathy: Secondary | ICD-10-CM | POA: Insufficient documentation

## 2018-03-24 DIAGNOSIS — I959 Hypotension, unspecified: Secondary | ICD-10-CM | POA: Diagnosis not present

## 2018-03-24 DIAGNOSIS — Z9114 Patient's other noncompliance with medication regimen: Secondary | ICD-10-CM | POA: Diagnosis not present

## 2018-03-24 DIAGNOSIS — I5020 Unspecified systolic (congestive) heart failure: Secondary | ICD-10-CM | POA: Diagnosis not present

## 2018-03-24 LAB — CBC
HEMATOCRIT: 32.5 % — AB (ref 40.0–52.0)
Hemoglobin: 11 g/dL — ABNORMAL LOW (ref 13.0–18.0)
MCH: 33 pg (ref 26.0–34.0)
MCHC: 33.9 g/dL (ref 32.0–36.0)
MCV: 97.3 fL (ref 80.0–100.0)
Platelets: 191 10*3/uL (ref 150–440)
RBC: 3.34 MIL/uL — ABNORMAL LOW (ref 4.40–5.90)
RDW: 16.6 % — AB (ref 11.5–14.5)
WBC: 5.4 10*3/uL (ref 3.8–10.6)

## 2018-03-24 LAB — BASIC METABOLIC PANEL
Anion gap: 10 (ref 5–15)
BUN: 8 mg/dL (ref 8–23)
CHLORIDE: 104 mmol/L (ref 98–111)
CO2: 25 mmol/L (ref 22–32)
Calcium: 9.2 mg/dL (ref 8.9–10.3)
Creatinine, Ser: 0.61 mg/dL (ref 0.61–1.24)
GFR calc Af Amer: >60 mL/min (ref >60)
GFR calc non Af Amer: >60 mL/min (ref >60)
GLUCOSE: 115 mg/dL — AB (ref 70–99)
POTASSIUM: 3.5 mmol/L (ref 3.5–5.1)
Sodium: 139 mmol/L (ref 135–145)

## 2018-03-24 LAB — TROPONIN I: Troponin I: <0.03 ng/mL (ref <0.03)

## 2018-03-24 MED ORDER — NYSTATIN 100000 UNIT/ML MT SUSP
5.0000 mL | Freq: Four times a day (QID) | OROMUCOSAL | Status: DC
  Administered 2018-03-24 – 2018-03-28 (×14): 500000 [IU] via OROMUCOSAL
  Filled 2018-03-24 (×14): qty 5

## 2018-03-24 MED ORDER — ADULT MULTIVITAMIN W/MINERALS CH
1.0000 | ORAL_TABLET | Freq: Every day | ORAL | Status: DC
  Administered 2018-03-25 – 2018-03-28 (×4): 1 via ORAL
  Filled 2018-03-24 (×4): qty 1

## 2018-03-24 MED ORDER — IPRATROPIUM-ALBUTEROL 0.5-2.5 (3) MG/3ML IN SOLN
3.0000 mL | Freq: Once | RESPIRATORY_TRACT | Status: AC
  Administered 2018-03-24: 3 mL via RESPIRATORY_TRACT
  Filled 2018-03-24: qty 3

## 2018-03-24 MED ORDER — ACETAMINOPHEN 650 MG RE SUPP: 650.0000 mg | Freq: Four times a day (QID) | RECTAL | Status: DC | PRN

## 2018-03-24 MED ORDER — ONDANSETRON HCL 4 MG PO TABS: 4.0000 mg | ORAL_TABLET | Freq: Four times a day (QID) | ORAL | Status: DC | PRN

## 2018-03-24 MED ORDER — LORAZEPAM 0.5 MG PO TABS
0.5000 mg | ORAL_TABLET | Freq: Four times a day (QID) | ORAL | Status: DC | PRN
  Administered 2018-03-24 – 2018-03-28 (×5): 0.5 mg via ORAL
  Filled 2018-03-24 (×5): qty 1

## 2018-03-24 MED ORDER — METHYLPREDNISOLONE SODIUM SUCC 125 MG IJ SOLR
60.0000 mg | Freq: Four times a day (QID) | INTRAMUSCULAR | Status: DC
  Administered 2018-03-25 (×3): 60 mg via INTRAVENOUS
  Filled 2018-03-24 (×3): qty 2

## 2018-03-24 MED ORDER — BENEPROTEIN PO POWD: 2.0000 | Freq: Three times a day (TID) | ORAL | Status: DC

## 2018-03-24 MED ORDER — METHYLPREDNISOLONE SODIUM SUCC 125 MG IJ SOLR
125.0000 mg | Freq: Once | INTRAMUSCULAR | Status: AC
  Administered 2018-03-24: 125 mg via INTRAVENOUS
  Filled 2018-03-24: qty 2

## 2018-03-24 MED ORDER — FUROSEMIDE 40 MG PO TABS
40.0000 mg | ORAL_TABLET | Freq: Every day | ORAL | Status: DC
  Administered 2018-03-25 – 2018-03-27 (×3): 40 mg via ORAL
  Filled 2018-03-24 (×4): qty 1

## 2018-03-24 MED ORDER — GABAPENTIN 300 MG PO CAPS
300.0000 mg | ORAL_CAPSULE | Freq: Every day | ORAL | Status: DC
  Administered 2018-03-24 – 2018-03-27 (×4): 300 mg via ORAL
  Filled 2018-03-24 (×4): qty 1

## 2018-03-24 MED ORDER — VITAMIN C 500 MG PO TABS
250.0000 mg | ORAL_TABLET | Freq: Two times a day (BID) | ORAL | Status: DC
  Administered 2018-03-24 – 2018-03-28 (×8): 250 mg via ORAL
  Filled 2018-03-24 (×9): qty 0.5

## 2018-03-24 MED ORDER — ACETAMINOPHEN 325 MG PO TABS: 650.0000 mg | ORAL_TABLET | Freq: Four times a day (QID) | ORAL | Status: DC | PRN

## 2018-03-24 MED ORDER — FUROSEMIDE 10 MG/ML IJ SOLN
60.0000 mg | Freq: Once | INTRAMUSCULAR | Status: AC
  Administered 2018-03-24: 60 mg via INTRAVENOUS
  Filled 2018-03-24: qty 8

## 2018-03-24 MED ORDER — SODIUM CHLORIDE 0.9% FLUSH: 3.0000 mL | INTRAVENOUS | Status: DC | PRN

## 2018-03-24 MED ORDER — POTASSIUM CHLORIDE CRYS ER 20 MEQ PO TBCR
40.0000 meq | EXTENDED_RELEASE_TABLET | Freq: Once | ORAL | Status: AC
  Administered 2018-03-24: 40 meq via ORAL
  Filled 2018-03-24: qty 2

## 2018-03-24 MED ORDER — VITAMIN D3 25 MCG (1000 UNIT) PO TABS
2000.0000 [IU] | ORAL_TABLET | Freq: Every evening | ORAL | Status: DC
  Administered 2018-03-24 – 2018-03-27 (×4): 2000 [IU] via ORAL
  Filled 2018-03-24 (×5): qty 2

## 2018-03-24 MED ORDER — ORAL CARE MOUTH RINSE: 15.0000 mL | Freq: Two times a day (BID) | OROMUCOSAL | Status: DC

## 2018-03-24 MED ORDER — ENOXAPARIN SODIUM 40 MG/0.4ML ~~LOC~~ SOLN
40.0000 mg | SUBCUTANEOUS | Status: DC
  Administered 2018-03-24 – 2018-03-27 (×4): 40 mg via SUBCUTANEOUS
  Filled 2018-03-24 (×4): qty 0.4

## 2018-03-24 MED ORDER — PANTOPRAZOLE SODIUM 40 MG PO TBEC
40.0000 mg | DELAYED_RELEASE_TABLET | Freq: Every day | ORAL | Status: DC
Start: 2018-03-25 — End: 2018-03-28
  Administered 2018-03-25 – 2018-03-28 (×4): 40 mg via ORAL
  Filled 2018-03-24 (×4): qty 1

## 2018-03-24 MED ORDER — SODIUM CHLORIDE 0.9% FLUSH
3.0000 mL | Freq: Two times a day (BID) | INTRAVENOUS | Status: DC
  Administered 2018-03-24 – 2018-03-28 (×8): 3 mL via INTRAVENOUS

## 2018-03-24 MED ORDER — SENNOSIDES-DOCUSATE SODIUM 8.6-50 MG PO TABS: 1.0000 | ORAL_TABLET | Freq: Every evening | ORAL | Status: DC | PRN

## 2018-03-24 MED ORDER — ONDANSETRON HCL 4 MG/2ML IJ SOLN
4.0000 mg | Freq: Four times a day (QID) | INTRAMUSCULAR | Status: DC | PRN
  Administered 2018-03-26: 4 mg via INTRAVENOUS
  Filled 2018-03-24: qty 2

## 2018-03-24 MED ORDER — ALBUTEROL SULFATE (2.5 MG/3ML) 0.083% IN NEBU
2.5000 mg | INHALATION_SOLUTION | RESPIRATORY_TRACT | Status: DC | PRN
  Administered 2018-03-26 – 2018-03-27 (×4): 2.5 mg via RESPIRATORY_TRACT
  Filled 2018-03-24 (×4): qty 3

## 2018-03-24 MED ORDER — PSEUDOEPHEDRINE HCL 30 MG PO TABS
30.0000 mg | ORAL_TABLET | Freq: Every day | ORAL | Status: DC | PRN
  Filled 2018-03-24: qty 1

## 2018-03-24 MED ORDER — IPRATROPIUM-ALBUTEROL 0.5-2.5 (3) MG/3ML IN SOLN
3.0000 mL | Freq: Four times a day (QID) | RESPIRATORY_TRACT | Status: DC
  Administered 2018-03-24 – 2018-03-27 (×11): 3 mL via RESPIRATORY_TRACT
  Filled 2018-03-24 (×12): qty 3

## 2018-03-24 MED ORDER — TIZANIDINE HCL 4 MG PO TABS
4.0000 mg | ORAL_TABLET | Freq: Every evening | ORAL | Status: DC
  Administered 2018-03-24 – 2018-03-28 (×3): 4 mg via ORAL
  Filled 2018-03-24 (×3): qty 1

## 2018-03-24 MED ORDER — SODIUM CHLORIDE 0.9 % IV SOLN: 250.0000 mL | INTRAVENOUS | Status: DC | PRN

## 2018-03-24 MED ORDER — OXYMETAZOLINE HCL 0.05 % NA SOLN
1.0000 | Freq: Every day | NASAL | Status: DC | PRN
Start: 2018-03-24 — End: 2018-03-28
  Filled 2018-03-24 (×2): qty 15

## 2018-03-24 NOTE — ED Notes (Signed)
Patient transported to [id number]

## 2018-03-24 NOTE — ED Notes (Signed)
Pt started feeling SOB about two days ago. Pt states " it got wore today and couldn't take it anymore". Pt states he uses oxygen at home 3L. Pt denies N/V. Pt states he uses a NB at home and that reliefs the SOB but when he starts moving around SOB starts again.

## 2018-03-24 NOTE — ED Provider Notes (Signed)
[facility] Emergency Department Provider Note  Time seen: 5:00 PM  I have reviewed the triage vital signs and the nursing notes.   HISTORY  Chief Complaint Shortness of Breath    HPI FILEMON BRETON is a 67 y.o. male with a past medical history of CAD, CHF, COPD, diabetes, hypertension, hyperlipidemia, presents to the emergency department for shortness of breath.  According to the patient for the past 4 days he has had progressively worsening shortness of breath.  Also states mild chest tightness.  Patient was recently discharged from the hospital 03/06/2018 after an exacerbation of COPD and CHF.  Patient wears 3 L of O2 chronically.  Has been wearing 4 L over the past 2 days due to increased shortness of breath.  States occasional cough with rare sputum production.  No fever.  No leg pain.   Past Medical History:  Diagnosis Date  . Arthritis   . CAD (coronary artery disease)    inferior MI 11/99 (in [place]) with PCI to [facility]. last [place] 2004 [facility]): EF 50%, patent RCA stent. no obstructive disease. last myoview 2008: EF 42%, inferior infarct, no ischemia.   . Cancer ([place])    CMML [place]  . CHF (congestive heart failure) ([place])   . COPD (chronic obstructive pulmonary disease) ([place])   . Diabetic autonomic neuropathy associated with type 2 diabetes mellitus ([place]) 03/27/2015  . Diverticulosis 2002  . DM2 (diabetes mellitus, type 2) ([facility])    A1c 6.0% 12/11  . Edema   . GERD (gastroesophageal reflux disease)   . HTN (hypertension)   . Hyperlipidemia   . Ischemic cardiomyopathy    mild with EF 42% on myoviews 2008.  . Medical non-compliance   . Myocardial infarction ([place])   . Nicotine dependence    chronic, active  . Seasonal allergic rhinitis   . Sleep apnea   . Smoker   . Spinal stenosis   . 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 TIA (transient ischemic attack)    hx;  now s/p PFO closure 2004 (in [place])  . Ulcer     Patient Active Problem List   Diagnosis Date Noted  . Ischemic cardiomyopathy 03/24/2018  . Respiratory failure with hypoxia ([place]) 03/03/2018  . Protein-calorie malnutrition, severe 03/03/2018  . 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 Diabetic autonomic neuropathy associated with type 2 diabetes mellitus ([place]) 03/27/2015  . History of inferior MI (myocardial infarction) 10/14/2013  . CAD (coronary artery disease)   . Hypertensive heart disease   . History of noncompliance with medical treatment 05/30/2012  . Systolic heart failure secondary to coronary artery disease ([place]) 05/30/2012  . Severe chronic obstructive pulmonary disease ([place]) 03/23/2011  . Other emphysema ([place]) 03/23/2011  . ALLERGIC RHINITIS CAUSE UNSPECIFIED 07/31/2010  . Type 2 diabetes mellitus with vascular disease ([place]) 01/02/2010  . TOBACCO USE 04/17/2009  . TRANSIENT ISCHEMIC ATTACKS, HX OF 04/17/2009  . Hyperlipidemia 12/19/2008    Past Surgical History:  Procedure Laterality Date  . BACK SURGERY     patient denies-just lumbar punctures  . BRONCHOSCOPY    . CARDIAC CATHETERIZATION  11/99   CI per [facility]  . CATARACT EXTRACTION W/PHACO Right 11/24/2016   Procedure: CATARACT EXTRACTION PHACO AND INTRAOCULAR LENS PLACEMENT (IOC);  Surgeon: Birder Robson, MD;  Location: [facility] ORS;  Service: Ophthalmology;  Laterality: Right;  [place] 1:04.3 AP% 22.3 CDE 14.35  Fluid pack lot # [id number]  . CATARACT EXTRACTION W/PHACO Left 12/29/2016   Procedure: CATARACT EXTRACTION PHACO AND INTRAOCULAR LENS PLACEMENT (IOC);  Surgeon: Birder Robson, MD;  Location: [facility] ORS;  Service: Ophthalmology;  Laterality: Left;  [place] 00:52 AP% 18.5 CDE 9.67 Fluid pack lot # [id number]  . COLONOSCOPY    . COLONOSCOPY WITH PROPOFOL N/A 05/25/2017   Procedure: COLONOSCOPY WITH PROPOFOL;  Surgeon: Jonathon Bellows, MD;  Location: [facility] ENDOSCOPY;  Service:  Gastroenterology;  Laterality: N/A;  . CORONARY ANGIOPLASTY     STENT  . CORONARY ARTERY BYPASS GRAFT     stent  . ESOPHAGOGASTRODUODENOSCOPY (EGD) WITH PROPOFOL N/A 05/25/2017   Procedure: ESOPHAGOGASTRODUODENOSCOPY (EGD) WITH PROPOFOL;  Surgeon: Jonathon Bellows, MD;  Location: [facility] ENDOSCOPY;  Service: Gastroenterology;  Laterality: N/A;  . EYE SURGERY    . FLEXIBLE BRONCHOSCOPY N/A 05/19/2017   Procedure: FLEXIBLE BRONCHOSCOPY;  Surgeon: Wilhelmina Mcardle, MD;  Location: [facility] ORS;  Service: Pulmonary;  Laterality: N/A;  . open heart surgery  2004   PFO repair  . UPPER GI ENDOSCOPY      Prior to Admission medications   Medication Sig Start Date End Date Taking? Authorizing Provider  Cholecalciferol (VITAMIN D) 2000 units CAPS Take 2,000 Units by mouth every evening.   Yes [provider]  gabapentin (NEURONTIN) 300 MG capsule Take 1 capsule (300 mg total) by mouth at bedtime. 03/23/18  Yes Copland, Frederico Hamman, MD  LORazepam (ATIVAN) 0.5 MG tablet Take 1 tablet (0.5 mg total) by mouth every 6 (six) hours as needed for anxiety. 03/10/18  Yes Copland, Frederico Hamman, MD  Multiple Vitamin (MULTIVITAMIN WITH MINERALS) TABS tablet Take 1 tablet by mouth daily. 03/07/18  Yes Vaughan Basta, MD  omeprazole (PRILOSEC) 20 MG capsule Take 20 mg by mouth daily.   Yes [provider]  tiZANidine (ZANAFLEX) 4 MG tablet Take 1 tablet (4 mg total) by mouth Nightly. 03/23/18  Yes Copland, Frederico Hamman, MD  vitamin C (VITAMIN C) 250 MG tablet Take 1 tablet (250 mg total) by mouth 2 (two) times daily. 03/06/18  Yes Vaughan Basta, MD  acetaminophen (TYLENOL) 500 MG tablet Take 1,000 mg by mouth daily as needed for moderate pain or headache.    [provider]  albuterol (PROVENTIL) (2.5 MG/3ML) 0.083% nebulizer solution Take 3 mLs (2.5 mg total) by nebulization every 4 (four) hours as needed for wheezing or shortness of breath. 03/06/18   Vaughan Basta, MD  budesonide (PULMICORT)  0.5 MG/2ML nebulizer solution Take 2 mLs (0.5 mg total) by nebulization 2 (two) times daily. 03/06/18 03/06/19  Vaughan Basta, MD  formoterol (PERFOROMIST) 20 MCG/2ML nebulizer solution Take 2 mLs (20 mcg total) by nebulization 2 (two) times daily. 02/24/18   [name], MD  ipratropium-albuterol (DUONEB) 0.5-2.5 (3) MG/3ML SOLN Take 3 mLs by nebulization every 4 (four) hours as needed. 03/11/18   [provider]  megestrol (MEGACE) 40 MG tablet Take 1 tablet (40 mg total) by mouth daily. Patient not taking: Reported on 03/24/2018 04/26/17   Lloyd Huger, MD  nystatin (MYCOSTATIN) 100000 UNIT/ML suspension Use as directed 5 mLs (500,000 Units total) in the mouth or throat 4 (four) times daily. 03/23/18   Copland, Frederico Hamman, MD  oxymetazoline (AFRIN) 0.05 % nasal spray Place 1 spray into both nostrils daily as needed for congestion.    [provider]  protein supplement (RESOURCE BENEPROTEIN) POWD Take 12-18 g by mouth 3 (three) times daily with meals. 03/06/18   Vaughan Basta, MD  pseudoephedrine (SUDAFED) 30  MG tablet Take 30 mg by mouth daily as needed for congestion.     [provider]    No Known Allergies  Family History  Problem Relation Age of Onset  . Coronary artery disease Unknown        family hx  . Breast cancer Unknown        1st egree relative <50    Social History Social History   Tobacco Use  . Smoking status: Former Smoker    Packs/day: 0.50    Years: 45.00    Pack years: 22.50    Types: Cigarettes    Last attempt to quit: 01/25/2018    Years since quitting: 0.1  . Smokeless tobacco: Never Used  . Tobacco comment: 1 ppd +40 years  Substance Use Topics  . Alcohol use: Not Currently    Alcohol/week: 0.0 standard drinks    Comment: weekly but last dose 1 month  . Drug use: No    Review of Systems Constitutional: Negative for fever. Eyes: Negative for visual complaints ENT: Negative for recent  illness/congestion Cardiovascular: Mild chest tightness Respiratory: Moderate shortness of breath, much worse with any exertion Gastrointestinal: Negative for abdominal pain, vomiting  Genitourinary: Negative for urinary compaints Musculoskeletal: Negative for leg pain Skin: Negative for skin complaints  Neurological: Negative for headache All other ROS negative  ____________________________________________   PHYSICAL EXAM:  VITAL SIGNS: ED Triage Vitals [03/24/18 1524]  Enc Vitals Group     BP 129/71     Pulse Rate (!) 116     Resp      Temp 97.7 F (36.5 C)     Temp Source Oral     SpO2 94 %     Weight 136 lb (61.7 kg)     Height _0  (1.803 m)     Head Circumference      Peak Flow      Pain Score 5     Pain Loc      Pain Edu?      Excl. in [place]?    Constitutional: Alert and oriented. Well appearing and in no distress. Eyes: Normal exam ENT   Head: Normocephalic and atraumatic   Mouth/Throat: Mucous membranes are moist. Cardiovascular: Normal rate, regular rhythm.  Respiratory: Mild tachypnea, patient has diminished breath sounds bilaterally with expiratory wheeze bilaterally. Gastrointestinal: Soft and nontender. No distention.  Musculoskeletal: Nontender with normal range of motion in all extremities. No lower extremity tenderness, mild edema bilaterally.. Neurologic:  Normal speech and language. No gross focal neurologic deficits Skin:  Skin is warm, dry and intact.  Psychiatric: Mood and affect are normal.  ____________________________________________    EKG  EKG reviewed and interpreted by myself shows sinus tachycardia 116 bpm with a widened QRS, normal axis, largely normal intervals besides QTC prolongation, nonspecific ST changes, largely unchanged from prior EKG 03/03/2018  ____________________________________________    RADIOLOGY  Chest x-ray shows increased interstitial thickening and superimposed on interstitial lung  disease  ____________________________________________   INITIAL IMPRESSION / ASSESSMENT AND PLAN / ED COURSE  Pertinent labs & imaging results that were available during my care of the patient were reviewed by me and considered in my medical decision making (see chart for details).  Patient presents to the emergency department with significant past medical history for shortness of breath ongoing and progressively worsening over the past 4 days.  Differential would include COPD exacerbation, CHF, URI, MI, ACS, pneumonia, pneumothorax.  We will check labs, chest x-ray and continue to closely  monitor.  EKG is largely unchanged from prior EKG.  Chest x-ray shows increased interstitial thickening with chronic lung disease.  Labs are largely at the patient's baseline with a negative troponin.  We will treat with Solu-Medrol duo nebs and a one-time dose of diuretic as well.  We will continue to closely monitor and reassess to see if the patient will require admission.  He is currently satting 97% on 4 L of oxygen, he states baseline requirement is 3 L.   Labs are largely within normal limits including negative troponin.  Patient continues to be short of breath despite breathing treatments and steroids in the emergency department.  Patient ambulated approximately 10 feet to use the restroom and became visibly short of breath heart rate around 130.  Given the patient's continued shortness of breath with significant comorbidities will admit to the hospitalist service for continued treatment for likely COPD exacerbation and mild interstitial edema. ____________________________________________   FINAL CLINICAL IMPRESSION(S) / ED DIAGNOSES  Dyspnea COPD exacerbation    Harvest Dark, MD 03/24/18 1810

## 2018-03-24 NOTE — H&P (Signed)
[facility] [place] NAME: Tony Watson    MR#:  [id number]  DATE OF BIRTH:  [birth year]  DATE OF ADMISSION:  03/24/2018  PRIMARY CARE PHYSICIAN: Owens Loffler, MD   REQUESTING/REFERRING PHYSICIAN:   CHIEF COMPLAINT:   Chief Complaint  Patient presents with  . Shortness of Breath    HISTORY OF PRESENT ILLNESS: Tony Watson  is a 67 y.o. male with a known history of coronary artery disease, congestive heart failure, COPD, type 2 diabetes mellitus, GERD, hypertension, ischemic cardiomyopathy presented to emergency room for increased shortness of breath and wheezing.  Patient is on oxygen at home at baseline.  He felt more wheezing and short of breath and could not ambulate much.  Had some breathing treatments at home but did not help.  Was given IV Solu-Medrol and aggressive nebulization treatments in the emergency room and stabilized.  He was also given a dose of Lasix for interstitial edema and the chest x-ray.  Hospitalist service was consulted to further care.  PAST MEDICAL HISTORY:   Past Medical History:  Diagnosis Date  . Arthritis   . CAD (coronary artery disease)    inferior MI 11/99 (in [place]) with PCI to [facility]. last [place] 2004 [facility]): EF 50%, patent RCA stent. no obstructive disease. last myoview 2008: EF 42%, inferior infarct, no ischemia.   . 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 CHF (congestive heart failure) ([place])   . COPD (chronic obstructive pulmonary disease) ([place])   . Diabetic autonomic neuropathy associated with type 2 diabetes mellitus ([place]) 03/27/2015  . Diverticulosis 2002  . DM2 (diabetes mellitus, type 2) (HCC)    A1c 6.0% 12/11  . Edema   . GERD (gastroesophageal reflux disease)   . HTN (hypertension)   . Hyperlipidemia   . Ischemic cardiomyopathy    mild with EF 42% on myoviews 2008.  . Medical non-compliance   . Myocardial infarction ([place])   . Nicotine dependence    chronic, active  . Seasonal allergic  rhinitis   . Sleep apnea   . Smoker   . Spinal stenosis   . Stroke ([place])    RECURRENT AFFECTING MEMORY  . Systolic heart failure secondary to coronary artery disease ([place]) 05/30/2012  . TIA (transient ischemic attack)    hx; now s/p PFO closure 2004 (in [place])  . Ulcer     PAST SURGICAL HISTORY:  Past Surgical History:  Procedure Laterality Date  . BACK SURGERY     patient denies-just lumbar punctures  . BRONCHOSCOPY    . CARDIAC CATHETERIZATION  11/99   CI per PMH  . CATARACT EXTRACTION W/PHACO Right 11/24/2016   Procedure: CATARACT EXTRACTION PHACO AND INTRAOCULAR LENS PLACEMENT (IOC);  Surgeon: Birder Robson, MD;  Location: [facility] ORS;  Service: Ophthalmology;  Laterality: Right;  [place] 1:04.3 AP% 22.3 CDE 14.35 Fluid pack lot # [id number]  . CATARACT EXTRACTION W/PHACO Left 12/29/2016   Procedure: CATARACT EXTRACTION PHACO AND INTRAOCULAR LENS PLACEMENT (IOC);  Surgeon: Birder Robson, MD;  Location: [facility] ORS;  Service: Ophthalmology;  Laterality: Left;  [place] 00:52 AP% 18.5 CDE 9.67 Fluid pack lot # [id number]  . COLONOSCOPY    . COLONOSCOPY WITH PROPOFOL N/A 05/25/2017   Procedure: COLONOSCOPY WITH PROPOFOL;  Surgeon: Jonathon Bellows, MD;  Location: [facility] ENDOSCOPY;  Service: Gastroenterology;  Laterality: N/A;  . CORONARY ANGIOPLASTY     STENT  . CORONARY ARTERY BYPASS GRAFT     stent  . ESOPHAGOGASTRODUODENOSCOPY (EGD)  WITH PROPOFOL N/A 05/25/2017   Procedure: ESOPHAGOGASTRODUODENOSCOPY (EGD) WITH PROPOFOL;  Surgeon: Jonathon Bellows, MD;  Location: [facility] ENDOSCOPY;  Service: Gastroenterology;  Laterality: N/A;  . EYE SURGERY    . FLEXIBLE BRONCHOSCOPY N/A 05/19/2017   Procedure: FLEXIBLE BRONCHOSCOPY;  Surgeon: Wilhelmina Mcardle, MD;  Location: [facility] ORS;  Service: Pulmonary;  Laterality: N/A;  . open heart surgery  2004   PFO repair  . UPPER GI ENDOSCOPY      SOCIAL HISTORY:  Social History   Tobacco Use  . Smoking status: Former Smoker    Packs/day: 0.50    Years: 45.00     Pack years: 22.50    Types: Cigarettes    Last attempt to quit: 01/25/2018    Years since quitting: 0.1  . Smokeless tobacco: Never Used  . Tobacco comment: 1 ppd +40 years  Substance Use Topics  . Alcohol use: Not Currently    Alcohol/week: 0.0 standard drinks    Comment: weekly but last dose 1 month    FAMILY HISTORY:  Family History  Problem Relation Age of Onset  . Coronary artery disease Unknown        family hx  . Breast cancer Unknown        1st egree relative <50  . Cancer Mother   . Heart disease Father     DRUG ALLERGIES: No Known Allergies  REVIEW OF SYSTEMS:   CONSTITUTIONAL: No fever, has fatigue and weakness.  EYES: No blurred or double vision.  EARS, NOSE, AND THROAT: No tinnitus or ear pain.  RESPIRATORY: Has cough, shortness of breath, wheezing  no hemoptysis.  CARDIOVASCULAR: No chest pain, orthopnea, edema.  GASTROINTESTINAL: No nausea, vomiting, diarrhea or abdominal pain.  GENITOURINARY: No dysuria, hematuria.  ENDOCRINE: No polyuria, nocturia,  HEMATOLOGY: No anemia, easy bruising or bleeding SKIN: No rash or lesion. MUSCULOSKELETAL: No joint pain or arthritis.   NEUROLOGIC: No tingling, numbness, weakness.  PSYCHIATRY: No anxiety or depression.   MEDICATIONS AT HOME:  Prior to Admission medications   Medication Sig Start Date End Date Taking? Authorizing Provider  Cholecalciferol (VITAMIN D) 2000 units CAPS Take 2,000 Units by mouth every evening.   Yes [provider]  gabapentin (NEURONTIN) 300 MG capsule Take 1 capsule (300 mg total) by mouth at bedtime. 03/23/18  Yes Copland, Frederico Hamman, MD  LORazepam (ATIVAN) 0.5 MG tablet Take 1 tablet (0.5 mg total) by mouth every 6 (six) hours as needed for anxiety. 03/10/18  Yes Copland, Frederico Hamman, MD  Multiple Vitamin (MULTIVITAMIN WITH MINERALS) TABS tablet Take 1 tablet by mouth daily. 03/07/18  Yes Vaughan Basta, MD  omeprazole (PRILOSEC) 20 MG capsule Take 20 mg by mouth daily.   Yes  [provider]  tiZANidine (ZANAFLEX) 4 MG tablet Take 1 tablet (4 mg total) by mouth Nightly. 03/23/18  Yes Copland, Frederico Hamman, MD  vitamin C (VITAMIN C) 250 MG tablet Take 1 tablet (250 mg total) by mouth 2 (two) times daily. 03/06/18  Yes Vaughan Basta, MD  acetaminophen (TYLENOL) 500 MG tablet Take 1,000 mg by mouth daily as needed for moderate pain or headache.    [provider]  albuterol (PROVENTIL) (2.5 MG/3ML) 0.083% nebulizer solution Take 3 mLs (2.5 mg total) by nebulization every 4 (four) hours as needed for wheezing or shortness of breath. 03/06/18   Vaughan Basta, MD  budesonide (PULMICORT) 0.5 MG/2ML nebulizer solution Take 2 mLs (0.5 mg total) by nebulization 2 (two) times daily. 03/06/18 03/06/19  Vaughan Basta, MD  formoterol (PERFOROMIST)  20 MCG/2ML nebulizer solution Take 2 mLs (20 mcg total) by nebulization 2 (two) times daily. 02/24/18   [name], MD  ipratropium-albuterol (DUONEB) 0.5-2.5 (3) MG/3ML SOLN Take 3 mLs by nebulization every 4 (four) hours as needed. 03/11/18   [provider]  megestrol (MEGACE) 40 MG tablet Take 1 tablet (40 mg total) by mouth daily. Patient not taking: Reported on 03/24/2018 04/26/17   Lloyd Huger, MD  nystatin (MYCOSTATIN) 100000 UNIT/ML suspension Use as directed 5 mLs (500,000 Units total) in the mouth or throat 4 (four) times daily. 03/23/18   Copland, Frederico Hamman, MD  oxymetazoline (AFRIN) 0.05 % nasal spray Place 1 spray into both nostrils daily as needed for congestion.    [provider]  protein supplement (RESOURCE BENEPROTEIN) POWD Take 12-18 g by mouth 3 (three) times daily with meals. 03/06/18   Vaughan Basta, MD  pseudoephedrine (SUDAFED) 30 MG tablet Take 30 mg by mouth daily as needed for congestion.     [provider]      PHYSICAL EXAMINATION:   VITAL SIGNS: Blood pressure 127/79, pulse (!) 113, temperature 97.7 F (36.5 C), temperature source  Oral, resp. rate (!) 21, height _0  (1.803 m), weight 61.7 kg, SpO2 98 %.  GENERAL:  67 y.o.-year-old patient lying in the bed with no acute distress.  EYES: Pupils equal, round, reactive to light and accommodation. No scleral icterus. Extraocular muscles intact.  HEENT: Head atraumatic, normocephalic. Oropharynx and nasopharynx clear.  NECK:  Supple, no jugular venous distention. No thyroid enlargement, no tenderness.  LUNGS: Decreased breath sounds bilaterally, bilateral wheezing. No use of accessory muscles of respiration.  CARDIOVASCULAR: S1, S2 normal. No murmurs, rubs, or gallops.  ABDOMEN: Soft, nontender, nondistended. Bowel sounds present. No organomegaly or mass.  EXTREMITIES: No pedal edema, cyanosis, or clubbing.  NEUROLOGIC: Cranial nerves II through XII are intact. Muscle strength 5/5 in all extremities. Sensation intact. Gait not checked.  PSYCHIATRIC: The patient is alert and oriented x 3.  SKIN: No obvious rash, lesion, or ulcer.   LABORATORY PANEL:   CBC Recent Labs  Lab 03/24/18 1529  WBC 5.4  HGB 11.0*  HCT 32.5*  PLT 191  MCV 97.3  MCH 33.0  MCHC 33.9  RDW 16.6*   ------------------------------------------------------------------------------------------------------------------  Chemistries  Recent Labs  Lab 03/24/18 1529  NA 139  K 3.5  CL 104  CO2 25  GLUCOSE 115*  BUN 8  CREATININE 0.61  CALCIUM 9.2   ------------------------------------------------------------------------------------------------------------------ estimated creatinine clearance is 79.3 mL/min (by C-G formula based on SCr of 0.61 mg/dL). ------------------------------------------------------------------------------------------------------------------ No results for input(s): TSH, T4TOTAL, T3FREE, THYROIDAB in the last 72 hours.  Invalid input(s): FREET3   Coagulation profile No results for input(s): INR, PROTIME in the last 168  hours. ------------------------------------------------------------------------------------------------------------------- No results for input(s): DDIMER in the last 72 hours. -------------------------------------------------------------------------------------------------------------------  Cardiac Enzymes Recent Labs  Lab 03/24/18 1529  TROPONINI <0.03   ------------------------------------------------------------------------------------------------------------------ Invalid input(s): POCBNP  ---------------------------------------------------------------------------------------------------------------  Urinalysis    Component Value Date/Time   COLORURINE YELLOW (A) 07/15/2015 1333   APPEARANCEUR CLEAR (A) 07/15/2015 1333   APPEARANCEUR Clear 02/05/2013 1230   LABSPEC 1.019 07/15/2015 1333   LABSPEC 1.026 02/05/2013 1230   PHURINE 6.0 07/15/2015 1333   GLUCOSEU NEGATIVE 07/15/2015 1333   GLUCOSEU Negative 02/05/2013 1230   HGBUR 2+ (A) 07/15/2015 1333   BILIRUBINUR NEGATIVE 07/15/2015 1333   BILIRUBINUR Negative 02/05/2013 1230   KETONESUR NEGATIVE 07/15/2015 1333   PROTEINUR 100 (A) 07/15/2015 1333   NITRITE NEGATIVE  07/15/2015 1333   LEUKOCYTESUR NEGATIVE 07/15/2015 1333   LEUKOCYTESUR Negative 02/05/2013 1230     RADIOLOGY: Dg Chest 2 View  Result Date: 03/24/2018 CLINICAL DATA:  Shortness of breath, chest tightness. EXAM: CHEST - 2 VIEW COMPARISON:  03/03/2018 FINDINGS: Postsurgical changes from median sternotomy, stable. Cardiomediastinal silhouette is normal. Mediastinal contours appear intact. There is no evidence of pleural effusion or pneumothorax. Worsened aeration of the lungs with increased interstitial thickening, superimposed to previously demonstrated interstitial lung disease. Osseous structures are without acute abnormality. Soft tissues are grossly normal. IMPRESSION: Increased interstitial thickening, superimposed to previously demonstrated cystic  interstitial lung disease. No evidence of pneumothorax. Electronically Signed   By: Fidela Salisbury M.D.   On: 03/24/2018 16:13    EKG: Orders placed or performed during the hospital encounter of 03/24/18  . EKG 12-Lead  . EKG 12-Lead  . ED EKG within 10 minutes  . ED EKG within 10 minutes    IMPRESSION AND PLAN:  67 year old male patient with history of COPD, congestive heart failure, coronary artery disease, cardiomyopathy, hypertension, GERD presented to the emergency room for shortness of breath and wheezing  -Acute COPD exacerbation Start patient on IV Solu-Medrol 60 MDQ 6 hourly Aggressive nebulization treatments Oxygen via nasal cannula Incentive spirometry  -Acute on chronic respiratory failure Continue oxygen supplementation  -Chronic congestive heart failure Oral Lasix 40 mg daily Input output chart and body weights  -DVT prophylaxis subcu Lovenox daily  All the records are reviewed and case discussed with ED provider. Management plans discussed with the patient, family and they are in agreement.  CODE STATUS:Full code Code Status History    Date Active Date Inactive Code Status Order ID Comments User Context   03/03/2018 0328 03/06/2018 1716 Full Code [id number]  Harrie Foreman, MD Inpatient       TOTAL TIME TAKING CARE OF THIS PATIENT: 54 minutes.    Saundra Shelling M.D on 03/24/2018 at 6:33 PM  Between 7am to 6pm - Pager - [phone]  After 6pm go to [email or web address] - password [email or web address] [facility] [facility]  Office  [phone]  CC: Primary care physician; Owens Loffler, MD

## 2018-03-24 NOTE — ED Triage Notes (Addendum)
Pt to ED c/o of SOB, chest tightness x4days. Pt on 3L nasal canula at baseline. Pt states nausea and chills. Denies Vomit and diarrhea. Pt currently on 2.5L nasal cannula, O2 currently 94%. Labored breathing noted, respirations even.

## 2018-03-24 NOTE — Progress Notes (Deleted)
Cardiology Office Note:    Date:  03/24/2018   ID:  Tony Watson, DOB [birth year], MRN [id number]  PCP:  Owens Loffler, MD  Cardiologist:  Shirlee More, MD   Referring MD: Owens Loffler, MD  ASSESSMENT:    No diagnosis found. PLAN:    In order of problems listed above:  1. ***  Next appointment   Medication Adjustments/Labs and Tests Ordered: Current medicines are reviewed at length with the patient today.  Concerns regarding medicines are outlined above.  No orders of the defined types were placed in this encounter.  No orders of the defined types were placed in this encounter.    No chief complaint on file. ***  History of Present Illness:    Tony Watson is a 67 y.o. male who is being seen today for the evaluation of *** at the request of Tony Watson, Tony Hamman, MD. He was seen by Dr. Fletcher Watson in 2013 background history of coronary artery disease inferior wall myocardial infarction 1991 with PCI and stent of the right coronary artery.  Other problems include TIA and surgical PFO closure in 2004.  Most recent evaluation of cardiac function in 2008 showed inferior wall motion abnormality ejection fraction 42% and right bundle branch block. Is admitted to [facility] 03/03/2018 with an exacerbation of COPD he was not seen by cardiology.  A post discharge echocardiogram showed a dilated left ventricle ejection fraction 25 to 30% with severe diffuse hypokinesia mild mitral regurgitation moderate left atrial enlargement mild right ventricular dysfunction and a dilated inferior vena cava consistent with elevated central venous pressure  Past Medical History:  Diagnosis Date  . Arthritis   . CAD (coronary artery disease)    inferior MI 11/99 (in [place]) with PCI to [facility]. last [place] 2004 [facility]): EF 50%, patent RCA stent. no obstructive disease. last myoview 2008: EF 42%, inferior infarct, no ischemia.   . 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 CHF (congestive heart failure)  ([place])   . COPD (chronic obstructive pulmonary disease) ([place])   . Diabetic autonomic neuropathy associated with type 2 diabetes mellitus ([place]) 03/27/2015  . Diverticulosis 2002  . DM2 (diabetes mellitus, type 2) (HCC)    A1c 6.0% 12/11  . Edema   . GERD (gastroesophageal reflux disease)   . HTN (hypertension)   . Hyperlipidemia   . Ischemic cardiomyopathy    mild with EF 42% on myoviews 2008.  . Medical non-compliance   . Myocardial infarction ([place])   . Nicotine dependence    chronic, active  . Seasonal allergic rhinitis   . Sleep apnea   . Smoker   . Spinal stenosis   . 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 Systolic heart failure secondary to coronary artery disease ([place]) 05/30/2012  . TIA (transient ischemic attack)    hx; now s/p PFO closure 2004 (in [place])  . Ulcer     Past Surgical History:  Procedure Laterality Date  . BACK SURGERY     patient denies-just lumbar punctures  . BRONCHOSCOPY    . CARDIAC CATHETERIZATION  11/99   CI per PMH  . CATARACT EXTRACTION W/PHACO Right 11/24/2016   Procedure: CATARACT EXTRACTION PHACO AND INTRAOCULAR LENS PLACEMENT (IOC);  Surgeon: Birder Robson, MD;  Location: [facility] ORS;  Service: Ophthalmology;  Laterality: Right;  [place] 1:04.3 AP% 22.3 CDE 14.35 Fluid pack lot # [id number]  . CATARACT EXTRACTION W/PHACO Left 12/29/2016   Procedure: CATARACT EXTRACTION PHACO AND INTRAOCULAR LENS PLACEMENT (  [place]);  Surgeon: Birder Robson, MD;  Location: [facility] ORS;  Service: Ophthalmology;  Laterality: Left;  [place] 00:52 AP% 18.5 CDE 9.67 Fluid pack lot # [id number]  . COLONOSCOPY    . COLONOSCOPY WITH PROPOFOL N/A 05/25/2017   Procedure: COLONOSCOPY WITH PROPOFOL;  Surgeon: Jonathon Bellows, MD;  Location[facility] ENDOSCOPY;  Service: Gastroenterology;  Laterality: N/A;  . CORONARY ANGIOPLASTY     STENT  . CORONARY ARTERY BYPASS GRAFT     stent  . ESOPHAGOGASTRODUODENOSCOPY (EGD) WITH PROPOFOL N/A 05/25/2017   Procedure: ESOPHAGOGASTRODUODENOSCOPY  (EGD) WITH PROPOFOL;  Surgeon: Jonathon Bellows, MD;  Location: [facility] ENDOSCOPY;  Service: Gastroenterology;  Laterality: N/A;  . EYE SURGERY    . FLEXIBLE BRONCHOSCOPY N/A 05/19/2017   Procedure: FLEXIBLE BRONCHOSCOPY;  Surgeon: Wilhelmina Mcardle, MD;  Location: [facility] ORS;  Service: Pulmonary;  Laterality: N/A;  . open heart surgery  2004   PFO repair  . UPPER GI ENDOSCOPY      Current Medications: No outpatient medications have been marked as taking for the 03/25/18 encounter (Appointment) with Tony Priest, MD.     Allergies:   Patient has no known allergies.   Social History   Socioeconomic History  . Marital status: Married    Spouse name: Not on file  . Number of children: Not on file  . Years of education: Not on file  . Highest education level: Not on file  Occupational History  . Not on file  Social Needs  . Financial resource strain: Not on file  . Food insecurity:    Worry: Not on file    Inability: Not on file  . Transportation needs:    Medical: Not on file    Non-medical: Not on file  Tobacco Use  . Smoking status: Former Smoker    Packs/day: 0.50    Years: 45.00    Pack years: 22.50    Types: Cigarettes    Last attempt to quit: 01/25/2018    Years since quitting: 0.1  . Smokeless tobacco: Never Used  . Tobacco comment: 1 ppd +40 years  Substance and Sexual Activity  . Alcohol use: Yes    Alcohol/week: 0.0 standard drinks    Comment: weekly but last dose 1 month  . Drug use: No  . Sexual activity: Not on file  Lifestyle  . Physical activity:    Days per week: Not on file    Minutes per session: Not on file  . Stress: Not on file  Relationships  . Social connections:    Talks on phone: Not on file    Gets together: Not on file    Attends religious service: Not on file    Active member of club or organization: Not on file    Attends meetings of clubs or organizations: Not on file    Relationship status: Not on file  Other Topics Concern  . Not on  file  Social History Narrative   Married, 1 daughter. 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 Lives in [place], [place]     Family History: The patient's ***family history includes Breast cancer in his unknown relative; Coronary artery disease in his unknown relative.  ROS:   ROS Please see the history of present illness.    *** All other systems reviewed and are negative.  EKGs/Labs/Other Studies Reviewed:    The following studies were reviewed today: ***  EKG:  EKG is *** ordered today.  The ekg ordered today demonstrates ***  Recent  Labs: 06/14/2017: ALT 25 03/03/2018: B Natriuretic Peptide 1,011.0; Hemoglobin 11.3; Platelets 260; TSH 1.930 03/05/2018: BUN 31; Creatinine, Ser 0.73; Potassium 3.7; Sodium 137  Recent Lipid Panel    Component Value Date/Time   CHOL 144 07/16/2016 0844   CHOL 106 02/06/2013 0455   TRIG 159.0 (H) 07/16/2016 0844   TRIG 75 02/06/2013 0455   HDL 32.90 (L) 07/16/2016 0844   HDL 37 (L) 02/06/2013 0455   CHOLHDL 4 07/16/2016 0844   VLDL 31.8 07/16/2016 0844   VLDL 15 02/06/2013 0455   LDLCALC 79 07/16/2016 0844   LDLCALC 54 02/06/2013 0455   LDLDIRECT 78.0 10/12/2013 1130    Physical Exam:    VS:  There were no vitals taken for this visit.    Wt Readings from Last 3 Encounters:  03/10/18 136 lb 8 oz (61.9 kg)  03/05/18 137 lb (62.1 kg)  02/27/18 138 lb (62.6 kg)     GEN: *** Well nourished, well developed in no acute distress HEENT: Normal NECK: No JVD; No carotid bruits LYMPHATICS: No lymphadenopathy CARDIAC: ***RRR, no murmurs, rubs, gallops RESPIRATORY:  Clear to auscultation without rales, wheezing or rhonchi  ABDOMEN: Soft, non-tender, non-distended MUSCULOSKELETAL:  No edema; No deformity  SKIN: Warm and dry NEUROLOGIC:  Alert and oriented x 3 PSYCHIATRIC:  Normal affect     Signed, Shirlee More, MD  03/24/2018 9:35 AM    [place]

## 2018-03-24 NOTE — Progress Notes (Signed)
Advanced care plan.  Purpose of the Encounter: CODE STATUS  Parties in Attendance: Patient and family  Patient's Decision Capacity: Good  Subjective/Patient's story: Presented to emergency room for shortness of breath and wheezing   Objective/Medical story Has COPD exacerbation Needs IV Solu-Medrol and nebulization treatments   Goals of care determination:  Advance care directives goals of care discussed with the patient Plan of treatment explained Patient wants everything done which includes CPR and intubation if need arises   CODE STATUS: Full code   Time spent discussing advanced care planning: 16 minutes

## 2018-03-25 ENCOUNTER — Ambulatory Visit: Payer: Medicare HMO | Admitting: Cardiology

## 2018-03-25 ENCOUNTER — Encounter

## 2018-03-25 LAB — CBC
HCT: 31 % — ABNORMAL LOW (ref 40.0–52.0)
Hemoglobin: 10.7 g/dL — ABNORMAL LOW (ref 13.0–18.0)
MCH: 33 pg (ref 26.0–34.0)
MCHC: 34.4 g/dL (ref 32.0–36.0)
MCV: 96 fL (ref 80.0–100.0)
Platelets: 193 10*3/uL (ref 150–440)
RBC: 3.23 MIL/uL — AB (ref 4.40–5.90)
RDW: 16.4 % — AB (ref 11.5–14.5)
WBC: 1.5 10*3/uL — AB (ref 3.8–10.6)

## 2018-03-25 LAB — BASIC METABOLIC PANEL
Anion gap: 8 (ref 5–15)
BUN: 13 mg/dL (ref 8–23)
CALCIUM: 8.9 mg/dL (ref 8.9–10.3)
CO2: 28 mmol/L (ref 22–32)
CREATININE: 0.73 mg/dL (ref 0.61–1.24)
Chloride: 102 mmol/L (ref 98–111)
GFR calc non Af Amer: >60 mL/min (ref >60)
Glucose, Bld: 209 mg/dL — ABNORMAL HIGH (ref 70–99)
Potassium: 4 mmol/L (ref 3.5–5.1)
SODIUM: 138 mmol/L (ref 135–145)

## 2018-03-25 LAB — GLUCOSE, CAPILLARY
GLUCOSE-CAPILLARY: 150 mg/dL — AB (ref 70–99)
Glucose-Capillary: 185 mg/dL — ABNORMAL HIGH (ref 70–99)

## 2018-03-25 MED ORDER — INSULIN ASPART 100 UNIT/ML ~~LOC~~ SOLN
0.0000 [IU] | Freq: Three times a day (TID) | SUBCUTANEOUS | Status: DC
  Administered 2018-03-25 – 2018-03-26 (×2): 1 [IU] via SUBCUTANEOUS
  Administered 2018-03-26 – 2018-03-27 (×3): 2 [IU] via SUBCUTANEOUS
  Administered 2018-03-27: 3 [IU] via SUBCUTANEOUS
  Administered 2018-03-28: 2 [IU] via SUBCUTANEOUS
  Filled 2018-03-25 (×7): qty 1

## 2018-03-25 MED ORDER — BUDESONIDE 0.25 MG/2ML IN SUSP
0.2500 mg | Freq: Two times a day (BID) | RESPIRATORY_TRACT | Status: DC
  Administered 2018-03-26 – 2018-03-28 (×5): 0.25 mg via RESPIRATORY_TRACT
  Filled 2018-03-25 (×6): qty 2

## 2018-03-25 MED ORDER — METHYLPREDNISOLONE SODIUM SUCC 40 MG IJ SOLR
40.0000 mg | Freq: Two times a day (BID) | INTRAMUSCULAR | Status: DC
  Administered 2018-03-27 – 2018-03-28 (×3): 40 mg via INTRAVENOUS
  Filled 2018-03-25 (×3): qty 1

## 2018-03-25 NOTE — Progress Notes (Signed)
Inpatient Diabetes Program Recommendations  AACE/ADA: New Consensus Statement on Inpatient Glycemic Control (2015)  Target Ranges:  Prepandial:   less than 140 mg/dL      Peak postprandial:   less than 180 mg/dL (1-2 hours)      Critically ill patients:  140 - 180 mg/dL   Results for Tony Watson, Tony Watson (MRN [id number]) as of 03/25/2018 08:52  Ref. Range 03/25/2018 04:58  Glucose Latest Ref Range: 70 - 99 mg/dL 209 (H)    Admit with: SOB/ COPD  History: DM, CHF  Home DM Meds: None listed  Current Orders: None      MD- Patient with History of Type 2 DM.  Getting Solumedrol 60 mg Q6 hours.  Please consider placing orders for Novolog Sensitive Correction Scale/ SSI (0-9 units) TID AC + HS     --Will follow patient during hospitalization--  Wyn Quaker RN, MSN, CDE Diabetes Coordinator Inpatient Glycemic Control Team Team Pager: [phone] (8a-5p)

## 2018-03-25 NOTE — Progress Notes (Signed)
[place] at [facility]                                                                                                                                                                                  Patient Demographics   Tony Watson, is a 67 y.o. male, DOB - Jul 11, 1951, MPN:[id number]  Admit date - 03/24/2018   Admitting Physician Saundra Shelling, MD  Outpatient Primary MD for the patient is Copland, Frederico Hamman, MD   LOS - 1  Subjective: Patient presented with shortness of breath noted to have COPD exasperation She is also noticed to have some swelling in his lower extremity   Review of Systems:   CONSTITUTIONAL: No documented fever. No fatigue, weakness. No weight gain, no weight loss.  EYES: No blurry or double vision.  ENT: No tinnitus. No postnasal drip. No redness of the oropharynx.  RESPIRATORY: No cough, positive wheeze, no hemoptysis.  Positive dyspnea.  CARDIOVASCULAR: No chest pain. No orthopnea. No palpitations. No syncope.  GASTROINTESTINAL: No nausea, no vomiting or diarrhea. No abdominal pain. No melena or hematochezia.  GENITOURINARY: No dysuria or hematuria.  ENDOCRINE: No polyuria or nocturia. No heat or cold intolerance.  HEMATOLOGY: No anemia. No bruising. No bleeding.  INTEGUMENTARY: No rashes. No lesions.  MUSCULOSKELETAL: No arthritis. No swelling. No gout.  NEUROLOGIC: No numbness, tingling, or ataxia. No seizure-type activity.  PSYCHIATRIC: No anxiety. No insomnia. No ADD.    Vitals:   Vitals:   03/25/18 0206 03/25/18 0329 03/25/18 0808 03/25/18 0816  BP:  125/76  108/74  Pulse:  91  93  Resp:  18  16  Temp:  97.6 F (36.4 C)  97.7 F (36.5 C)  TempSrc:  Oral  Oral  SpO2: 96% 97% 99% 100%  Weight:  64.8 kg    Height:        Wt Readings from Last 3 Encounters:  03/25/18 64.8 kg  03/10/18 61.9 kg  03/05/18 62.1 kg     Intake/Output Summary (Last 24 hours) at 03/25/2018 1442 Last data filed at 03/25/2018  1344 Gross per 24 hour  Intake 486 ml  Output 250 ml  Net 236 ml    Physical Exam:   GENERAL: Pleasant-appearing in no apparent distress.  HEAD, EYES, EARS, NOSE AND THROAT: Atraumatic, normocephalic. Extraocular muscles are intact. Pupils equal and reactive to light. Sclerae anicteric. No conjunctival injection. No oro-pharyngeal erythema.  NECK: Supple. There is no jugular venous distention. No bruits, no lymphadenopathy, no thyromegaly.  HEART: Regular rate and rhythm,. No murmurs, no rubs, no clicks.  LUNGS: Bilateral wheezing throughout both lung.  ABDOMEN: Soft, flat, nontender, nondistended. Has good bowel sounds. No hepatosplenomegaly  appreciated.  EXTREMITIES: No evidence of any cyanosis, clubbing, or peripheral edema.  +2 pedal and radial pulses bilaterally.  NEUROLOGIC: The patient is alert, awake, and oriented x3 with no focal motor or sensory deficits appreciated bilaterally.  SKIN: Moist and warm with no rashes appreciated.  Psych: Not anxious, depressed LN: No inguinal LN enlargement    Antibiotics   Anti-infectives (From admission, onward)   None      Medications   Scheduled Meds: . cholecalciferol  2,000 Units Oral QPM  . enoxaparin (LOVENOX) injection  40 mg Subcutaneous Q24H  . furosemide  40 mg Oral Daily  . gabapentin  300 mg Oral QHS  . ipratropium-albuterol  3 mL Nebulization Q6H  . mouth rinse  15 mL Mouth Rinse BID  . methylPREDNISolone (SOLU-MEDROL) injection  60 mg Intravenous Q6H  . multivitamin with minerals  1 tablet Oral Daily  . nystatin  5 mL Mouth/Throat QID  . pantoprazole  40 mg Oral Daily  . sodium chloride flush  3 mL Intravenous Q12H  . tiZANidine  4 mg Oral Nightly  . ascorbic acid  250 mg Oral BID   Continuous Infusions: . sodium chloride     PRN Meds:.sodium chloride, acetaminophen **OR** acetaminophen, albuterol, LORazepam, ondansetron **OR** ondansetron (ZOFRAN) IV, oxymetazoline, pseudoephedrine, senna-docusate, sodium  chloride flush   Data Review:   Micro Results No results found for this or any previous visit (from the past 240 hour(s)).  Radiology Reports Dg Chest 2 View  Result Date: 03/24/2018 CLINICAL DATA:  Shortness of breath, chest tightness. EXAM: CHEST - 2 VIEW COMPARISON:  03/03/2018 FINDINGS: Postsurgical changes from median sternotomy, stable. Cardiomediastinal silhouette is normal. Mediastinal contours appear intact. There is no evidence of pleural effusion or pneumothorax. Worsened aeration of the lungs with increased interstitial thickening, superimposed to previously demonstrated interstitial lung disease. Osseous structures are without acute abnormality. Soft tissues are grossly normal. IMPRESSION: Increased interstitial thickening, superimposed to previously demonstrated cystic interstitial lung disease. No evidence of pneumothorax. Electronically Signed   By: Fidela Salisbury M.D.   On: 03/24/2018 16:13   Dg Chest 2 View  Result Date: 02/27/2018 CLINICAL DATA:  Increasing shortness of breath. History of COPD on home oxygen. Former smoker. EXAM: CHEST - 2 VIEW COMPARISON:  CT chest 05/13/2017.  Chest 07/15/2015 FINDINGS: Normal heart size and pulmonary vascularity. Postoperative changes in the mediastinum. Coarse interstitial infiltrates diffusely throughout the lungs. As better seen on previous chest CT, this appears to represent a combination of bronchiectasis and reticulonodular interstitial lung disease. No focal consolidation. No atelectasis or volume loss. No blunting of costophrenic angles. No pneumothorax. Increased lung volumes consistent with emphysema. No blunting of costophrenic angles. No pneumothorax. Mediastinal contours appear intact. IMPRESSION: Diffuse interstitial lung disease consistent with chronic process. No focal consolidation. Electronically Signed   By: Lucienne Capers M.D.   On: 02/27/2018 21:46   Dg Chest Port 1 View  Result Date: 03/03/2018 CLINICAL DATA:   Short of breath EXAM: PORTABLE CHEST 1 VIEW COMPARISON:  02/27/2018, CT chest 05/13/2017 FINDINGS: Diffuse interstitial lung disease. No confluent opacity. Normal heart size. Post sternotomy changes. No pneumothorax. IMPRESSION: Similar appearance of diffuse interstitial lung disease. No acute focal airspace disease or pleural effusion is seen. Electronically Signed   By: Donavan Foil M.D.   On: 03/03/2018 01:36     CBC Recent Labs  Lab 03/24/18 1529 03/25/18 0458  WBC 5.4 1.5*  HGB 11.0* 10.7*  HCT 32.5* 31.0*  PLT 191 193  MCV 97.3 96.0  MCH 33.0 33.0  MCHC 33.9 34.4  RDW 16.6* 16.4*    Chemistries  Recent Labs  Lab 03/24/18 1529 03/25/18 0458  NA 139 138  K 3.5 4.0  CL 104 102  CO2 25 28  GLUCOSE 115* 209*  BUN 8 13  CREATININE 0.61 0.73  CALCIUM 9.2 8.9   ------------------------------------------------------------------------------------------------------------------ estimated creatinine clearance is 83.3 mL/min (by C-G formula based on SCr of 0.73 mg/dL). ------------------------------------------------------------------------------------------------------------------ No results for input(s): HGBA1C in the last 72 hours. ------------------------------------------------------------------------------------------------------------------ No results for input(s): CHOL, HDL, LDLCALC, TRIG, CHOLHDL, LDLDIRECT in the last 72 hours. ------------------------------------------------------------------------------------------------------------------ No results for input(s): TSH, T4TOTAL, T3FREE, THYROIDAB in the last 72 hours.  Invalid input(s): FREET3 ------------------------------------------------------------------------------------------------------------------ No results for input(s): VITAMINB12, FOLATE, FERRITIN, TIBC, IRON, RETICCTPCT in the last 72 hours.  Coagulation profile No results for input(s): INR, PROTIME in the last 168 hours.  No results for input(s):  DDIMER in the last 72 hours.  Cardiac Enzymes Recent Labs  Lab 03/24/18 1529  TROPONINI <0.03   ------------------------------------------------------------------------------------------------------------------ Invalid input(s): POCBNP    Assessment & Plan  67 year old male patient with history of COPD, congestive heart failure, coronary artery disease, cardiomyopathy, hypertension, GERD presented to the emergency room for shortness of breath and wheezing  -Acute COPD exacerbation Continue patient on IV Solu-Medrol 60 every 6 hourly Aggressive nebulization treatments Oxygen via nasal cannula Incentive spirometry  -Acute on chronic respiratory failure Continue oxygen supplementation  -Chronic congestive heart failure Continue oral Lasix 40 mg daily Input output chart and body weights Due to borderline blood pressure unable to use Coreg or ACE inhibitor   -Hyperglycemia history of diabetes start patient on sliding scale insulin  -DVT prophylaxis subcu Lovenox daily       Code Status Orders  (From admission, onward)         Start     Ordered   03/24/18 1948  Full code  Continuous     03/24/18 1947        Code Status History    Date Active Date Inactive Code Status Order ID Comments User Context   03/03/2018 0328 03/06/2018 1716 Full Code [id number]  Harrie Foreman, MD Inpatient           Consults none  DVT Prophylaxis  Lovenox  Lab Results  Component Value Date   PLT 193 03/25/2018     Time Spent in minutes 35 minutes  Greater than 50% of time spent in care coordination and counseling patient regarding the condition and plan of care.   Dustin Flock M.D on 03/25/2018 at 2:42 PM  Between 7am to 6pm - Pager - [phone]  After 6pm go to [email or web address] - Proofreader  [employer]   Office  [phone]

## 2018-03-26 ENCOUNTER — Encounter: Payer: Self-pay | Admitting: Cardiology

## 2018-03-26 DIAGNOSIS — I5022 Chronic systolic (congestive) heart failure: Secondary | ICD-10-CM

## 2018-03-26 LAB — CBC
HCT: 32.3 % — ABNORMAL LOW (ref 40.0–52.0)
Hemoglobin: 11 g/dL — ABNORMAL LOW (ref 13.0–18.0)
MCH: 33 pg (ref 26.0–34.0)
MCHC: 34.1 g/dL (ref 32.0–36.0)
MCV: 96.6 fL (ref 80.0–100.0)
PLATELETS: 241 10*3/uL (ref 150–440)
RBC: 3.34 MIL/uL — AB (ref 4.40–5.90)
RDW: 16.3 % — ABNORMAL HIGH (ref 11.5–14.5)
WBC: 10.3 10*3/uL (ref 3.8–10.6)

## 2018-03-26 LAB — GLUCOSE, CAPILLARY
GLUCOSE-CAPILLARY: 147 mg/dL — AB (ref 70–99)
GLUCOSE-CAPILLARY: 145 mg/dL — AB (ref 70–99)
Glucose-Capillary: 197 mg/dL — ABNORMAL HIGH (ref 70–99)
Glucose-Capillary: 101 mg/dL — ABNORMAL HIGH (ref 70–99)

## 2018-03-26 LAB — BASIC METABOLIC PANEL
Anion gap: 10 (ref 5–15)
BUN: 19 mg/dL (ref 8–23)
CO2: 26 mmol/L (ref 22–32)
Calcium: 9.2 mg/dL (ref 8.9–10.3)
Chloride: 102 mmol/L (ref 98–111)
Creatinine, Ser: 0.68 mg/dL (ref 0.61–1.24)
GFR calc Af Amer: >60 mL/min (ref >60)
Glucose, Bld: 201 mg/dL — ABNORMAL HIGH (ref 70–99)
POTASSIUM: 3.9 mmol/L (ref 3.5–5.1)
Sodium: 138 mmol/L (ref 135–145)

## 2018-03-26 LAB — HIV ANTIBODY (ROUTINE TESTING W REFLEX): HIV Screen 4th Generation wRfx: NONREACTIVE

## 2018-03-26 MED ORDER — CARVEDILOL 3.125 MG PO TABS
3.1250 mg | ORAL_TABLET | Freq: Two times a day (BID) | ORAL | Status: DC
  Administered 2018-03-26 – 2018-03-28 (×3): 3.125 mg via ORAL
  Filled 2018-03-26 (×4): qty 1

## 2018-03-26 MED ORDER — METHYLPREDNISOLONE SODIUM SUCC 125 MG IJ SOLR
INTRAMUSCULAR | Status: AC
  Administered 2018-03-26: 40 mg
  Filled 2018-03-26: qty 2

## 2018-03-26 NOTE — Progress Notes (Signed)
Pt shows expiratory wheezeand rhonci to ascultation and feels anxious. PRN albuterol SVN and ativan given. I will continue to assess.

## 2018-03-26 NOTE — Progress Notes (Signed)
CARDIOLOGY CONSULT NOTE  Patient ID: Tony Watson MRN: [id number] DOB/AGE: 10-09-1950 67 y.o.  Admit date: 03/24/2018 Primary Physician Owens Loffler, MD Primary Cardiologist   Kathlyn Sacramento, MD Chief Complaint  Dyspnea  Requesting  Dr. Vianne Bulls  HPI:  The patient presented on 8/15 with acute respiratory distress.  He says that he has chronic dyspnea but at his best he can get around to the bathroom with O2 and does not have acute SOB.  However,he presented when he started having resting SOB despite increasing his O2 per [place] to 4 liters.  He has cough with yellow sputum.  He has not had consistent edema or swelling elsewhere.  He does not describe new PND or orthopnea.  He was not having fevers or chills.     In the ED he was treated with nebulizers and IV steroids.  He was also treated with Lasix IV.  CXR did not suggest significant volume overload.  He has been started on a low dose of beta blocker this admission and is now on oral Lasix.  We are asked to consult further on management of his chronic systolic HF.      He has a history of prior MI and saw Dr. Fletcher Anon in 2013.  His EF was 40% when he last saw him.  He apparently stopped his heart meds and per Dr. Edilia Bo did not present for follow up heart visits over the years.  He reports to me that he took Coreg on occasion.  However, I see from the last discharge note that he was taken off of this in July at discharge.   I see from the last office note in 2013 with Dr. Fletcher Anon that the patient had a history of CAD with MI in 1999 and PCI of the RCA and surgical PFO closure.   There is documentation of non compliance with medications.   I do see mention of a stress test after that visit that apparently demonstrated an inferior infarct but no ischemia.  He was in the hospital in late July with respiratory failure and an exacerbation of COPD.  I reviewed these records and I note that he had an EF of 25 - 30% noted on echo.  He is on 3 liters  of O2 at home.  At the time of his hospitalization last month he was not seen by cardiology.  The EF was lower than previously recorded.  He did see Dr. Edilia Bo after hospital discharge and was to be referred back to cardiology although I don't see that this appt was made yet.  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      Past Medical History:  Diagnosis Date  . Arthritis   . CAD (coronary artery disease)    inferior MI 11/99 (in [place]) with PCI to [facility]. last [place] 2004 [facility]): EF 50%, patent RCA stent. no obstructive disease. last myoview 2008: EF 42%, inferior infarct, no ischemia.   . Cancer ([place])    CMML [place]  . CHF (congestive heart failure) ([place])   . COPD (chronic obstructive pulmonary disease) ([place])   . Diabetic autonomic neuropathy associated with type 2 diabetes mellitus ([place]) 03/27/2015  . Diverticulosis 2002  . DM2 (diabetes mellitus, type 2) ([facility])    A1c 6.0% 12/11  . Edema   . GERD (gastroesophageal reflux disease)   . HTN (hypertension)   .  Hyperlipidemia   . Ischemic cardiomyopathy    mild with EF 42% on myoviews 2008.  . Medical non-compliance   . Myocardial infarction ([place])   . Nicotine dependence    chronic, active  . Seasonal allergic rhinitis   . Sleep apnea   . Smoker   . Spinal stenosis   . 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 Systolic heart failure secondary to coronary artery disease ([place]) 05/30/2012  . TIA (transient ischemic attack)    hx; now s/p PFO closure 2004 (in [place])  . Ulcer     Past Surgical History:  Procedure Laterality Date  . BACK SURGERY     patient denies-just lumbar punctures  . BRONCHOSCOPY    . CARDIAC CATHETERIZATION  11/99   CI per [facility]  . CATARACT EXTRACTION W/PHACO Right 11/24/2016   Procedure: CATARACT EXTRACTION PHACO AND INTRAOCULAR LENS PLACEMENT (IOC);  Surgeon: Birder Robson, MD;  Location: [facility] ORS;   Service: Ophthalmology;  Laterality: Right;  [place] 1:04.3 AP% 22.3 CDE 14.35 Fluid pack lot # [id number]  . CATARACT EXTRACTION W/PHACO Left 12/29/2016   Procedure: CATARACT EXTRACTION PHACO AND INTRAOCULAR LENS PLACEMENT (IOC);  Surgeon: Birder Robson, MD;  Location: [facility] ORS;  Service: Ophthalmology;  Laterality: Left;  [place] 00:52 AP% 18.5 CDE 9.67 Fluid pack lot # [id number]  . COLONOSCOPY    . COLONOSCOPY WITH PROPOFOL N/A 05/25/2017   Procedure: COLONOSCOPY WITH PROPOFOL;  Surgeon: Jonathon Bellows, MD;  Location: [facility] ENDOSCOPY;  Service: Gastroenterology;  Laterality: N/A;  . CORONARY ANGIOPLASTY     STENT  . CORONARY ARTERY BYPASS GRAFT     stent  . ESOPHAGOGASTRODUODENOSCOPY (EGD) WITH PROPOFOL N/A 05/25/2017   Procedure: ESOPHAGOGASTRODUODENOSCOPY (EGD) WITH PROPOFOL;  Surgeon: Jonathon Bellows, MD;  Location: [facility] ENDOSCOPY;  Service: Gastroenterology;  Laterality: N/A;  . EYE SURGERY    . FLEXIBLE BRONCHOSCOPY N/A 05/19/2017   Procedure: FLEXIBLE BRONCHOSCOPY;  Surgeon: Wilhelmina Mcardle, MD;  Location: [facility] ORS;  Service: Pulmonary;  Laterality: N/A;  . open heart surgery  2004   PFO repair  . UPPER GI ENDOSCOPY      No Known Allergies Medications Prior to Admission  Medication Sig Dispense Refill Last Dose  . Cholecalciferol (VITAMIN D) 2000 units CAPS Take 2,000 Units by mouth every evening.   03/23/2018 at Unknown time  . gabapentin (NEURONTIN) 300 MG capsule Take 1 capsule (300 mg total) by mouth at bedtime. 90 capsule 1 03/23/2018 at Unknown time  . LORazepam (ATIVAN) 0.5 MG tablet Take 1 tablet (0.5 mg total) by mouth every 6 (six) hours as needed for anxiety. 30 tablet 1 03/23/2018 at Unknown time  . Multiple Vitamin (MULTIVITAMIN WITH MINERALS) TABS tablet Take 1 tablet by mouth daily. 30 tablet 0 03/23/2018 at Unknown time  . omeprazole (PRILOSEC) 20 MG capsule Take 20 mg by mouth daily.   03/23/2018 at Unknown time  . tiZANidine (ZANAFLEX) 4 MG tablet Take 1 tablet (4 mg total) by  mouth Nightly. 90 tablet 3 03/23/2018 at Unknown time  . vitamin C (VITAMIN C) 250 MG tablet Take 1 tablet (250 mg total) by mouth 2 (two) times daily. 30 tablet 0 03/23/2018 at Unknown time  . acetaminophen (TYLENOL) 500 MG tablet Take 1,000 mg by mouth daily as needed for moderate pain or headache.   PRN at PRN  . albuterol (PROVENTIL) (2.5 MG/3ML) 0.083% nebulizer solution Take 3 mLs (2.5 mg total) by nebulization every 4 (four) hours as needed for wheezing or shortness of  breath. 75 mL 0 PRN at PRN  . budesonide (PULMICORT) 0.5 MG/2ML nebulizer solution Take 2 mLs (0.5 mg total) by nebulization 2 (two) times daily. 1440 mL 0 PRN at PRN  . formoterol (PERFOROMIST) 20 MCG/2ML nebulizer solution Take 2 mLs (20 mcg total) by nebulization 2 (two) times daily. 500 mL 12 Taking  . ipratropium-albuterol (DUONEB) 0.5-2.5 (3) MG/3ML SOLN Take 3 mLs by nebulization every 4 (four) hours as needed.  5 PRN at PRN  . megestrol (MEGACE) 40 MG tablet Take 1 tablet (40 mg total) by mouth daily. (Patient not taking: Reported on 03/24/2018) 30 tablet 2 Not Taking at Unknown time  . nystatin (MYCOSTATIN) 100000 UNIT/ML suspension Use as directed 5 mLs (500,000 Units total) in the mouth or throat 4 (four) times daily. 473 mL 1 PRN at PRN  . oxymetazoline (AFRIN) 0.05 % nasal spray Place 1 spray into both nostrils daily as needed for congestion.   PRN at PRN  . protein supplement (RESOURCE BENEPROTEIN) POWD Take 12-18 g by mouth 3 (three) times daily with meals. 227 g 0 PRN at PRN  . pseudoephedrine (SUDAFED) 30 MG tablet Take 30 mg by mouth daily as needed for congestion.    PRN at PRN   Family History  Problem Relation Age of Onset  . Coronary artery disease Unknown        family hx  . Breast cancer Unknown        1st egree relative <50  . Cancer Mother   . Heart disease Father     Social History   Socioeconomic History  . Marital status: Married    Spouse name: Not on file  . Number of children: Not on file    . Years of education: Not on file  . Highest education level: Not on file  Occupational History  . Not on file  Social Needs  . Financial resource strain: Not on file  . Food insecurity:    Worry: Not on file    Inability: Not on file  . Transportation needs:    Medical: Not on file    Non-medical: Not on file  Tobacco Use  . Smoking status: Former Smoker    Packs/day: 0.50    Years: 45.00    Pack years: 22.50    Types: Cigarettes    Last attempt to quit: 01/25/2018    Years since quitting: 0.1  . Smokeless tobacco: Never Used  . Tobacco comment: 1 ppd +40 years  Substance and Sexual Activity  . Alcohol use: Not Currently    Alcohol/week: 0.0 standard drinks    Comment: weekly but last dose 1 month  . Drug use: No  . Sexual activity: Not on file  Lifestyle  . Physical activity:    Days per week: Not on file    Minutes per session: Not on file  . Stress: Not on file  Relationships  . Social connections:    Talks on phone: Not on file    Gets together: Not on file    Attends religious service: Not on file    Active member of club or organization: Not on file    Attends meetings of clubs or organizations: Not on file    Relationship status: Not on file  . Intimate partner violence:    Fear of current or ex partner: Not on file    Emotionally abused: Not on file    Physically abused: Not on file    Forced sexual activity: Not  on file  Other Topics Concern  . Not on file  Social History Narrative   Married, 1 daughter. 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 Lives in [place], [place]     ROS:   90 lb weight loss in one year.  Otherwise as stated in the HPI and negative for all other systems.  Physical Exam: Blood pressure 112/68, pulse (!) 113, temperature 98.6 F (37 C), temperature source Oral, resp. rate 18, height _0  (1.803 m), weight 64.8 kg, SpO2 100 %.  GENERAL:  Frail appearing and very thin HEENT:  Pupils equal round and reactive,  fundi not visualized, oral mucosa unremarkable NECK:  No jugular venous distention, waveform within normal limits, carotid upstroke brisk and symmetric, no bruits, no thyromegaly LYMPHATICS:  No cervical, inguinal adenopathy LUNGS:  Decreased breath sounds with diffuse wheezing no rales BACK:  No CVA tenderness CHEST:  Unremarkable HEART:  PMI not displaced or sustained,S1 and S2 within normal limits, no S3, no S4, no clicks, no rubs, no murmurs ABD:  Flat, positive bowel sounds normal in frequency in pitch, no bruits, no rebound, no guarding, no midline pulsatile mass, no hepatomegaly, no splenomegaly EXT:  2 plus pulses throughout, no edema, no cyanosis no clubbing SKIN:  No rashes no nodules NEURO:  Cranial nerves II through XII grossly intact, motor grossly intact throughout PSYCH:  Cognitively intact, oriented to person place and time    Labs: Lab Results  Component Value Date   BUN 19 03/26/2018   Lab Results  Component Value Date   CREATININE 0.68 03/26/2018   Lab Results  Component Value Date   NA 138 03/26/2018   K 3.9 03/26/2018   CL 102 03/26/2018   CO2 26 03/26/2018   Lab Results  Component Value Date   TROPONINI <0.03 03/24/2018   Lab Results  Component Value Date   WBC 10.3 03/26/2018   HGB 11.0 (L) 03/26/2018   HCT 32.3 (L) 03/26/2018   MCV 96.6 03/26/2018   PLT 241 03/26/2018   Lab Results  Component Value Date   CHOL 144 07/16/2016   HDL 32.90 (L) 07/16/2016   LDLCALC 79 07/16/2016   LDLDIRECT 78.0 10/12/2013   TRIG 159.0 (H) 07/16/2016   CHOLHDL 4 07/16/2016   Lab Results  Component Value Date   ALT 25 06/14/2017   AST 29 06/14/2017   ALKPHOS 83 06/14/2017   BILITOT 0.5 06/14/2017   Echo 03/03/18 :  Study Conclusions  - Left ventricle: The cavity size was mildly dilated. Systolic   function was severely reduced. The estimated ejection fraction   was in the range of 25% to 30%. Severe diffuse hypokinesis.   Anterior wall motion best  preserved. The study is not technically   sufficient to allow evaluation of LV diastolic function. - Mitral valve: There was mild regurgitation. - Left atrium: The atrium was moderately dilated. - Right ventricle: Systolic function was mildly reduced. - Pulmonary arteries: Systolic pressure could not be accurately   estimated. - Inferior vena cava: The vessel was dilated. The respirophasic   diameter changes were blunted (< 50%), consistent with elevated   central venous pressure.   Radiology:   CXR: Increased interstitial thickening, superimposed to previously demonstrated cystic interstitial lung disease.  EKG:  Sinus tach, RBBB, PACs, rate 116.  No acute ST T wave changes.   ASSESSMENT AND PLAN:   ACUTE ON CHRONIC SYSTOLIC HF:   He has had a long history of CAD with reduced EF.  I did an extensive review of previous records.  Unfortunately there is documentation of non compliance with follow up and medications and lifestyle changes.  He does not recall ever being told he needed follow up but does report that he has not been complaint with meds and until recently was still smoking.  Unfortunately, now he presents with multiple poorly compensated medical conditions.  Overall I do not think that volume overload is the main issue or that his condition is related to shock.  There might be some mild congestion and he likely does need baseline PO diuretic and I agree with restarting the beta blocker.  I explained that there is no quick reversal of this condition (if any can be improved at all) and that there is no reason for acute invasive testing.  He needs slow med titration, continued lifestyle adherence (salt and fluid), and adherence with follow up in our clinic.    RESPIRATORY FAILURE:    Multifactorial.  Continue treatment of his chronic lung disease per the primary team.    CAD:    He has not had evidence of ongoing ischemia.  (Trop neg.  No acute EKG changes.)  No ischemia work up is  suggested at this time.      SignedMinus Breeding 03/26/2018, 2:37 PM

## 2018-03-26 NOTE — Progress Notes (Addendum)
[place] at [facility]                                                                                                                                                                                  Patient Demographics   Tony Watson, is a 67 y.o. male, DOB - January 13, 1951, DDU:[id number]  Admit date - 03/24/2018   Admitting Physician Saundra Shelling, MD  Outpatient Primary MD for the patient is Copland, Frederico Hamman, MD   LOS - 2  Subjective: Patient presented with shortness of breath noted to have COPD exasperation So noticed swelling of the legs.  Today he feels slightly better than yesterday.  Review of Systems:   CONSTITUTIONAL: No documented fever. No fatigue, weakness. No weight gain, no weight loss.  EYES: No blurry or double vision.  ENT: No tinnitus. No postnasal drip. No redness of the oropharynx.  RESPIRATORY:, Shortness of breath, slight leg swelling.  CARDIOVASCULAR: No chest pain. No orthopnea. No palpitations. No syncope.  GASTROINTESTINAL: No nausea, no vomiting or diarrhea. No abdominal pain. No melena or hematochezia.  GENITOURINARY: No dysuria or hematuria.  ENDOCRINE: No polyuria or nocturia. No heat or cold intolerance.  HEMATOLOGY: No anemia. No bruising. No bleeding.  INTEGUMENTARY: No rashes. No lesions.  MUSCULOSKELETAL: No arthritis. No swelling. No gout.  NEUROLOGIC: No numbness, tingling, or ataxia. No seizure-type activity.  PSYCHIATRIC: No anxiety. No insomnia. No ADD.    Vitals:   Vitals:   03/26/18 0237 03/26/18 0508 03/26/18 0727 03/26/18 0740  BP:  118/78  112/68  Pulse:  (!) 107  (!) 113  Resp:    18  Temp:  98.6 F (37 C)    TempSrc:  Oral    SpO2: 97% 98% 98% 100%  Weight:      Height:        Wt Readings from Last 3 Encounters:  03/25/18 64.8 kg  03/10/18 61.9 kg  03/05/18 62.1 kg     Intake/Output Summary (Last 24 hours) at 03/26/2018 1252 Last data filed at 03/26/2018 1132 Gross per 24 hour  Intake 480  ml  Output 1425 ml  Net -945 ml    Physical Exam:   GENERAL: Pleasant-appearing in no apparent distress.  HEAD, EYES, EARS, NOSE AND THROAT: Atraumatic, normocephalic. Extraocular muscles are intact. Pupils equal and reactive to light. Sclerae anicteric. No conjunctival injection. No oro-pharyngeal erythema.  NECK: Supple. There is no jugular venous distention. No bruits, no lymphadenopathy, no thyromegaly.  HEART: Regular rate and rhythm,. No murmurs, no rubs, no clicks.  LUNGS: Bilateral wheezing throughout both lungs.  ABDOMEN: Soft, flat, nontender, nondistended. Has good bowel sounds. No hepatosplenomegaly appreciated.  EXTREMITIES: No  evidence of any cyanosis, clubbing, or peripheral edema.  +2 pedal and radial pulses bilaterally.  NEUROLOGIC: The patient is alert, awake, and oriented x3 with no focal motor or sensory deficits appreciated bilaterally.  SKIN: Moist and warm with no rashes appreciated.  Psych: Not anxious, depressed LN: No inguinal LN enlargement    Antibiotics   Anti-infectives (From admission, onward)   None      Medications   Scheduled Meds: . budesonide (PULMICORT) nebulizer solution  0.25 mg Nebulization BID  . cholecalciferol  2,000 Units Oral QPM  . enoxaparin (LOVENOX) injection  40 mg Subcutaneous Q24H  . furosemide  40 mg Oral Daily  . gabapentin  300 mg Oral QHS  . insulin aspart  0-9 Units Subcutaneous TID WC  . ipratropium-albuterol  3 mL Nebulization Q6H  . mouth rinse  15 mL Mouth Rinse BID  . methylPREDNISolone (SOLU-MEDROL) injection  40 mg Intravenous Q12H  . multivitamin with minerals  1 tablet Oral Daily  . nystatin  5 mL Mouth/Throat QID  . pantoprazole  40 mg Oral Daily  . sodium chloride flush  3 mL Intravenous Q12H  . tiZANidine  4 mg Oral Nightly  . ascorbic acid  250 mg Oral BID   Continuous Infusions: . sodium chloride     PRN Meds:.sodium chloride, acetaminophen **OR** acetaminophen, albuterol, LORazepam, ondansetron  **OR** ondansetron (ZOFRAN) IV, oxymetazoline, pseudoephedrine, senna-docusate, sodium chloride flush   Data Review:   Micro Results No results found for this or any previous visit (from the past 240 hour(s)).  Radiology Reports Dg Chest 2 View  Result Date: 03/24/2018 CLINICAL DATA:  Shortness of breath, chest tightness. EXAM: CHEST - 2 VIEW COMPARISON:  03/03/2018 FINDINGS: Postsurgical changes from median sternotomy, stable. Cardiomediastinal silhouette is normal. Mediastinal contours appear intact. There is no evidence of pleural effusion or pneumothorax. Worsened aeration of the lungs with increased interstitial thickening, superimposed to previously demonstrated interstitial lung disease. Osseous structures are without acute abnormality. Soft tissues are grossly normal. IMPRESSION: Increased interstitial thickening, superimposed to previously demonstrated cystic interstitial lung disease. No evidence of pneumothorax. Electronically Signed   By: Fidela Salisbury M.D.   On: 03/24/2018 16:13   Dg Chest 2 View  Result Date: 02/27/2018 CLINICAL DATA:  Increasing shortness of breath. History of COPD on home oxygen. Former smoker. EXAM: CHEST - 2 VIEW COMPARISON:  CT chest 05/13/2017.  Chest 07/15/2015 FINDINGS: Normal heart size and pulmonary vascularity. Postoperative changes in the mediastinum. Coarse interstitial infiltrates diffusely throughout the lungs. As better seen on previous chest CT, this appears to represent a combination of bronchiectasis and reticulonodular interstitial lung disease. No focal consolidation. No atelectasis or volume loss. No blunting of costophrenic angles. No pneumothorax. Increased lung volumes consistent with emphysema. No blunting of costophrenic angles. No pneumothorax. Mediastinal contours appear intact. IMPRESSION: Diffuse interstitial lung disease consistent with chronic process. No focal consolidation. Electronically Signed   By: Lucienne Capers M.D.   On:  02/27/2018 21:46   Dg Chest Port 1 View  Result Date: 03/03/2018 CLINICAL DATA:  Short of breath EXAM: PORTABLE CHEST 1 VIEW COMPARISON:  02/27/2018, CT chest 05/13/2017 FINDINGS: Diffuse interstitial lung disease. No confluent opacity. Normal heart size. Post sternotomy changes. No pneumothorax. IMPRESSION: Similar appearance of diffuse interstitial lung disease. No acute focal airspace disease or pleural effusion is seen. Electronically Signed   By: Donavan Foil M.D.   On: 03/03/2018 01:36     CBC Recent Labs  Lab 03/24/18 1529 03/25/18 0458 03/26/18 0524  WBC 5.4 1.5* 10.3  HGB 11.0* 10.7* 11.0*  HCT 32.5* 31.0* 32.3*  PLT 191 193 241  MCV 97.3 96.0 96.6  MCH 33.0 33.0 33.0  MCHC 33.9 34.4 34.1  RDW 16.6* 16.4* 16.3*    Chemistries  Recent Labs  Lab 03/24/18 1529 03/25/18 0458 03/26/18 0524  NA 139 138 138  K 3.5 4.0 3.9  CL 104 102 102  CO2 25 28 26   GLUCOSE 115* 209* 201*  BUN 8 13 19   CREATININE 0.61 0.73 0.68  CALCIUM 9.2 8.9 9.2   ------------------------------------------------------------------------------------------------------------------ estimated creatinine clearance is 83.3 mL/min (by C-G formula based on SCr of 0.68 mg/dL). ------------------------------------------------------------------------------------------------------------------ No results for input(s): HGBA1C in the last 72 hours. ------------------------------------------------------------------------------------------------------------------ No results for input(s): CHOL, HDL, LDLCALC, TRIG, CHOLHDL, LDLDIRECT in the last 72 hours. ------------------------------------------------------------------------------------------------------------------ No results for input(s): TSH, T4TOTAL, T3FREE, THYROIDAB in the last 72 hours.  Invalid input(s): FREET3 ------------------------------------------------------------------------------------------------------------------ No results for input(s):  VITAMINB12, FOLATE, FERRITIN, TIBC, IRON, RETICCTPCT in the last 72 hours.  Coagulation profile No results for input(s): INR, PROTIME in the last 168 hours.  No results for input(s): DDIMER in the last 72 hours.  Cardiac Enzymes Recent Labs  Lab 03/24/18 1529  TROPONINI <0.03   ------------------------------------------------------------------------------------------------------------------ Invalid input(s): POCBNP    Assessment & Plan  67 year old male patient with history of COPD, congestive heart failure, coronary artery disease, cardiomyopathy, hypertension, GERD presented to the emergency room for shortness of breath and wheezing  -Acute COPD exacerbation Continue patient on IV Solu-Medrol 60 every 6 hourly Aggressive nebulization treatments Oxygen via nasal cannula Incentive spirometry, continue IV steroids at the same dose for another 24 hours because of persistent wheezing, shortness of breath  -Acute on chronic respiratory failure Continue oxygen supplementation  -Chronic congestive heart failure, EF 25 to 30% by echocardiogram done in my 26th. Continue oral Lasix 40 mg daily Input output chart and body weights bp patient is better, resume Coreg, consult cardiology.  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     Code Status Orders  (From admission, onward)         Start     Ordered   03/24/18 1948  Full code  Continuous     03/24/18 1947        Code Status History    Date Active Date Inactive Code Status Order ID Comments User Context   03/03/2018 0328 03/06/2018 1716 Full Code [id number]  Harrie Foreman, MD Inpatient           Consults none  DVT Prophylaxis  Lovenox  Lab Results  Component Value Date   PLT 241 03/26/2018     Time Spent in minutes 40 minutes  Greater than 50% of time spent in care coordination and  counseling,  disCussed with patient, patient's wife, requesting cardiology consult.  p on 03/26/2018 at 12:52 PM  Between 7am to 6pm - Pager - [phone]  After 6pm go to [email or web address] - Proofreader  [employer]   Office  [phone]

## 2018-03-27 LAB — GLUCOSE, CAPILLARY
GLUCOSE-CAPILLARY: 210 mg/dL — AB (ref 70–99)
GLUCOSE-CAPILLARY: 159 mg/dL — AB (ref 70–99)
GLUCOSE-CAPILLARY: 170 mg/dL — AB (ref 70–99)
Glucose-Capillary: 151 mg/dL — ABNORMAL HIGH (ref 70–99)

## 2018-03-27 MED ORDER — FUROSEMIDE 20 MG PO TABS
20.0000 mg | ORAL_TABLET | Freq: Every day | ORAL | Status: DC
  Administered 2018-03-28: 20 mg via ORAL
  Filled 2018-03-27: qty 1

## 2018-03-27 MED ORDER — LEVALBUTEROL HCL 0.63 MG/3ML IN NEBU
0.6300 mg | INHALATION_SOLUTION | Freq: Four times a day (QID) | RESPIRATORY_TRACT | Status: DC
  Administered 2018-03-27 – 2018-03-28 (×4): 0.63 mg via RESPIRATORY_TRACT
  Filled 2018-03-27 (×4): qty 3

## 2018-03-27 NOTE — Progress Notes (Signed)
[facility] at [facility]                                                                                                                                                                                  Patient Demographics   Tony Watson, is a 67 y.o. male, DOB - 11-02-1950, YTK:[id number]  Admit date - 03/24/2018   Admitting Physician Tony Shelling, MD  Outpatient Primary MD for the patient is Copland, Frederico Hamman, MD   LOS - 3  Subjective: Still shortOf breath, has wheezing..  No other complaints.  Patient has slight hypotension today.  Review of Systems:   CONSTITUTIONAL: No documented fever. No fatigue, weakness. No weight gain, no weight loss.  EYES: No blurry or double vision.  ENT: No tinnitus. No postnasal drip. No redness of the oropharynx.  RESPIRATORY:, Shortness of breath,   CARDIOVASCULAR: No chest pain. No orthopnea. No palpitations. No syncope.  GASTROINTESTINAL: No nausea, no vomiting or diarrhea. No abdominal pain. No melena or hematochezia.  GENITOURINARY: No dysuria or hematuria.  ENDOCRINE: No polyuria or nocturia. No heat or cold intolerance.  HEMATOLOGY: No anemia. No bruising. No bleeding.  INTEGUMENTARY: No rashes. No lesions.  MUSCULOSKELETAL: No arthritis. No swelling. No gout.  NEUROLOGIC: No numbness, tingling, or ataxia. No seizure-type activity.  PSYCHIATRIC: No anxiety. No insomnia. No ADD.    Vitals:   Vitals:   03/26/18 1939 03/26/18 1950 03/27/18 0426 03/27/18 0814  BP: 127/74  105/76 98/81  Pulse: (!) 117  99 94  Resp: 18  18 18   Temp: (!) 97.5 F (36.4 C)  (!) 97.4 F (36.3 C)   TempSrc: Oral  Oral   SpO2: 98% 96% 98% 98%  Weight:      Height:        Wt Readings from Last 3 Encounters:  03/25/18 64.8 kg  03/10/18 61.9 kg  03/05/18 62.1 kg     Intake/Output Summary (Last 24 hours) at 03/27/2018 1321 Last data filed at 03/27/2018 1021 Gross per 24 hour  Intake 720 ml  Output 1375 ml  Net -655 ml     Physical Exam:   GENERAL: Pleasant-appearing in no apparent distress.  HEAD, EYES, EARS, NOSE AND THROAT: Atraumatic, normocephalic. Extraocular muscles are intact. Pupils equal and reactive to light. Sclerae anicteric. No conjunctival injection. No oro-pharyngeal erythema.  NECK: Supple. There is no jugular venous distention. No bruits, no lymphadenopathy, no thyromegaly.  HEART: Regular rate and rhythm,. No murmurs, no rubs, no clicks.  LUNGS: Patient does have bilateral wheezing in both lungs but better than yesterday. ABDOMEN: Soft, flat, nontender, nondistended. Has good bowel sounds. No hepatosplenomegaly appreciated.  EXTREMITIES: No evidence of  any cyanosis, clubbing, or peripheral edema.  +2 pedal and radial pulses bilaterally.  NEUROLOGIC: The patient is alert, awake, and oriented x3 with no focal motor or sensory deficits appreciated bilaterally.  SKIN: Moist and warm with no rashes appreciated.  Psych: Not anxious, depressed LN: No inguinal LN enlargement    Antibiotics   Anti-infectives (From admission, onward)   None      Medications   Scheduled Meds: . budesonide (PULMICORT) nebulizer solution  0.25 mg Nebulization BID  . carvedilol  3.125 mg Oral BID WC  . cholecalciferol  2,000 Units Oral QPM  . enoxaparin (LOVENOX) injection  40 mg Subcutaneous Q24H  . furosemide  40 mg Oral Daily  . gabapentin  300 mg Oral QHS  . insulin aspart  0-9 Units Subcutaneous TID WC  . ipratropium-albuterol  3 mL Nebulization Q6H  . methylPREDNISolone (SOLU-MEDROL) injection  40 mg Intravenous Q12H  . multivitamin with minerals  1 tablet Oral Daily  . nystatin  5 mL Mouth/Throat QID  . pantoprazole  40 mg Oral Daily  . sodium chloride flush  3 mL Intravenous Q12H  . tiZANidine  4 mg Oral Nightly  . ascorbic acid  250 mg Oral BID   Continuous Infusions: . sodium chloride     PRN Meds:.sodium chloride, acetaminophen **OR** acetaminophen, albuterol, LORazepam, ondansetron  **OR** ondansetron (ZOFRAN) IV, oxymetazoline, pseudoephedrine, senna-docusate, sodium chloride flush   Data Review:   Micro Results No results found for this or any previous visit (from the past 240 hour(s)).  Radiology Reports Dg Chest 2 View  Result Date: 03/24/2018 CLINICAL DATA:  Shortness of breath, chest tightness. EXAM: CHEST - 2 VIEW COMPARISON:  03/03/2018 FINDINGS: Postsurgical changes from median sternotomy, stable. Cardiomediastinal silhouette is normal. Mediastinal contours appear intact. There is no evidence of pleural effusion or pneumothorax. Worsened aeration of the lungs with increased interstitial thickening, superimposed to previously demonstrated interstitial lung disease. Osseous structures are without acute abnormality. Soft tissues are grossly normal. IMPRESSION: Increased interstitial thickening, superimposed to previously demonstrated cystic interstitial lung disease. No evidence of pneumothorax. Electronically Signed   By: Tony Watson M.D.   On: 03/24/2018 16:13   Dg Chest 2 View  Result Date: 02/27/2018 CLINICAL DATA:  Increasing shortness of breath. History of COPD on home oxygen. Former smoker. EXAM: CHEST - 2 VIEW COMPARISON:  CT chest 05/13/2017.  Chest 07/15/2015 FINDINGS: Normal heart size and pulmonary vascularity. Postoperative changes in the mediastinum. Coarse interstitial infiltrates diffusely throughout the lungs. As better seen on previous chest CT, this appears to represent a combination of bronchiectasis and reticulonodular interstitial lung disease. No focal consolidation. No atelectasis or volume loss. No blunting of costophrenic angles. No pneumothorax. Increased lung volumes consistent with emphysema. No blunting of costophrenic angles. No pneumothorax. Mediastinal contours appear intact. IMPRESSION: Diffuse interstitial lung disease consistent with chronic process. No focal consolidation. Electronically Signed   By: Tony Watson M.D.   On:  02/27/2018 21:46   Dg Chest Port 1 View  Result Date: 03/03/2018 CLINICAL DATA:  Short of breath EXAM: PORTABLE CHEST 1 VIEW COMPARISON:  02/27/2018, CT chest 05/13/2017 FINDINGS: Diffuse interstitial lung disease. No confluent opacity. Normal heart size. Post sternotomy changes. No pneumothorax. IMPRESSION: Similar appearance of diffuse interstitial lung disease. No acute focal airspace disease or pleural effusion is seen. Electronically Signed   By: Donavan Foil M.D.   On: 03/03/2018 01:36     CBC Recent Labs  Lab 03/24/18 1529 03/25/18 0458 03/26/18 0524  WBC 5.4 1.5*  10.3  HGB 11.0* 10.7* 11.0*  HCT 32.5* 31.0* 32.3*  PLT 191 193 241  MCV 97.3 96.0 96.6  MCH 33.0 33.0 33.0  MCHC 33.9 34.4 34.1  RDW 16.6* 16.4* 16.3*    Chemistries  Recent Labs  Lab 03/24/18 1529 03/25/18 0458 03/26/18 0524  NA 139 138 138  K 3.5 4.0 3.9  CL 104 102 102  CO2 25 28 26   GLUCOSE 115* 209* 201*  BUN 8 13 19   CREATININE 0.61 0.73 0.68  CALCIUM 9.2 8.9 9.2   ------------------------------------------------------------------------------------------------------------------ estimated creatinine clearance is 83.3 mL/min (by C-G formula based on SCr of 0.68 mg/dL). ------------------------------------------------------------------------------------------------------------------ No results for input(s): HGBA1C in the last 72 hours. ------------------------------------------------------------------------------------------------------------------ No results for input(s): CHOL, HDL, LDLCALC, TRIG, CHOLHDL, LDLDIRECT in the last 72 hours. ------------------------------------------------------------------------------------------------------------------ No results for input(s): TSH, T4TOTAL, T3FREE, THYROIDAB in the last 72 hours.  Invalid input(s): FREET3 ------------------------------------------------------------------------------------------------------------------ No results for input(s):  VITAMINB12, FOLATE, FERRITIN, TIBC, IRON, RETICCTPCT in the last 72 hours.  Coagulation profile No results for input(s): INR, PROTIME in the last 168 hours.  No results for input(s): DDIMER in the last 72 hours.  Cardiac Enzymes Recent Labs  Lab 03/24/18 1529  TROPONINI <0.03   ------------------------------------------------------------------------------------------------------------------ Invalid input(s): POCBNP    Assessment & Plan  67 year old male patient with history of COPD, congestive heart failure, coronary artery disease, cardiomyopathy, hypertension, GERD presented to the emergency room for shortness of breath and wheezing  -Acute COPD exacerbation Decrease dose of IV steroids slowly change nebs to Xopenex because of tachycardia. -Acute on chronic respiratory failure Continue oxygen supplementation  -Chronic congestive heart failure, EF 25 to 30% by echocardiogram done in my 26th. Continue oral Lasix 40 mg daily was seen by cardiology.  Because of hypotension decrease the dose of Lasix. Input output chart and body weights bp patient is better, resume Coreg, seen by cardiology, has history of noncompliance.  Patient has multiple poorly compensated medical problems.  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     Code Status Orders  (From admission, onward)         Start     Ordered   03/24/18 1948  Full code  Continuous     03/24/18 1947        Code Status History    Date Active Date Inactive Code Status Order ID Comments User Context   03/03/2018 0328 03/06/2018 1716 Full Code [id number]  Harrie Foreman, MD Inpatient           Consults none  DVT Prophylaxis  Lovenox  Lab Results  Component Value Date   PLT 241 03/26/2018     Time Spent in minutes 40 minutes  Greater than 50% of time spent in care coordination and  counseling,  disCussed with patient, patient's wife, requesting cardiology consult.  p on 03/27/2018 at 1:21 PM  Between 7am to 6pm - Pager - [phone]  After 6pm go to [email or web address] - Proofreader  [employer]   Office  [phone]

## 2018-03-27 NOTE — Progress Notes (Signed)
Progress Note  Patient Name: Tony Watson Date of Encounter: 03/27/2018  Primary Cardiologist:   Kathlyn Sacramento, MD   Subjective   Not breathing as well.  No chest pain.  Eating well  Inpatient Medications    Scheduled Meds: . budesonide (PULMICORT) nebulizer solution  0.25 mg Nebulization BID  . carvedilol  3.125 mg Oral BID WC  . cholecalciferol  2,000 Units Oral QPM  . enoxaparin (LOVENOX) injection  40 mg Subcutaneous Q24H  . furosemide  40 mg Oral Daily  . gabapentin  300 mg Oral QHS  . insulin aspart  0-9 Units Subcutaneous TID WC  . ipratropium-albuterol  3 mL Nebulization Q6H  . methylPREDNISolone (SOLU-MEDROL) injection  40 mg Intravenous Q12H  . multivitamin with minerals  1 tablet Oral Daily  . nystatin  5 mL Mouth/Throat QID  . pantoprazole  40 mg Oral Daily  . sodium chloride flush  3 mL Intravenous Q12H  . tiZANidine  4 mg Oral Nightly  . ascorbic acid  250 mg Oral BID   Continuous Infusions: . sodium chloride     PRN Meds: sodium chloride, acetaminophen **OR** acetaminophen, albuterol, LORazepam, ondansetron **OR** ondansetron (ZOFRAN) IV, oxymetazoline, pseudoephedrine, senna-docusate, sodium chloride flush   Vital Signs    Vitals:   03/26/18 1939 03/26/18 1950 03/27/18 0426 03/27/18 0814  BP: 127/74  105/76 98/81  Pulse: (!) 117  99 94  Resp: 18  18 18   Temp: (!) 97.5 F (36.4 C)  (!) 97.4 F (36.3 C)   TempSrc: Oral  Oral   SpO2: 98% 96% 98% 98%  Weight:      Height:        Intake/Output Summary (Last 24 hours) at 03/27/2018 1317 Last data filed at 03/27/2018 1021 Gross per 24 hour  Intake 720 ml  Output 1375 ml  Net -655 ml   Filed Weights   03/24/18 1524 03/24/18 1956 03/25/18 0329  Weight: 61.7 kg 64.8 kg 64.8 kg    Telemetry    Sinus tach and PVCs. - Personally Reviewed  ECG    NA - Personally Reviewed  Physical Exam   GEN: No acute distress.   Neck: No  JVD Cardiac: RRR, no murmurs, rubs, or gallops.    Respiratory:   Diffuse wheezing GI: Soft, nontender, non-distended  MS: No  edema; No deformity. Neuro:  Nonfocal  Psych: Normal affect   Labs    Chemistry Recent Labs  Lab 03/24/18 1529 03/25/18 0458 03/26/18 0524  NA 139 138 138  K 3.5 4.0 3.9  CL 104 102 102  CO2 25 28 26   GLUCOSE 115* 209* 201*  BUN 8 13 19   CREATININE 0.61 0.73 0.68  CALCIUM 9.2 8.9 9.2  GFRNONAA >60 >60 >60  GFRAA >60 >60 >60  ANIONGAP 10 8 10      Hematology Recent Labs  Lab 03/24/18 1529 03/25/18 0458 03/26/18 0524  WBC 5.4 1.5* 10.3  RBC 3.34* 3.23* 3.34*  HGB 11.0* 10.7* 11.0*  HCT 32.5* 31.0* 32.3*  MCV 97.3 96.0 96.6  MCH 33.0 33.0 33.0  MCHC 33.9 34.4 34.1  RDW 16.6* 16.4* 16.3*  PLT 191 193 241    Cardiac Enzymes Recent Labs  Lab 03/24/18 1529  TROPONINI <0.03   No results for input(s): TROPIPOC in the last 168 hours.   BNPNo results for input(s): BNP, PROBNP in the last 168 hours.   DDimer No results for input(s): DDIMER in the last 168 hours.   Radiology    No  results found.  Cardiac Studies   NA  Patient Profile     67 y.o. male with ischemic cardiomyopathy and not consistent adherence with meds or followup who is admitted with acute on chronic respiratory failure.  He has a long history of O2 dependent COPD.    Assessment & Plan    ACUTE ON CHRONIC SYSTOLIC HF:  Down 2 liters since admission.  Continue current meds.  I will write the Coreg to be given if SBP greater than 90.  It was held this am with his SBP at 100.    RESPIRATORY FAILURE:  I think that continued max pulmonary treatment is the issues.  Less likely acute CHF.  He has not improved despite diuresis.   Could consider right heart cath if his clinical situation does not improve.    CAD:  No evidence of acute coronary syndrome.      For questions or updates, please contact [place] Please consult [email or web address] for contact info under Cardiology/STEMI.   Signed, Minus Breeding, MD   03/27/2018, 1:17 PM

## 2018-03-27 NOTE — Progress Notes (Signed)
MD notified. Pts SBP<100 coreg held this a.m. I will continue to assess.

## 2018-03-28 ENCOUNTER — Ambulatory Visit: Payer: Medicare HMO | Admitting: Internal Medicine

## 2018-03-28 ENCOUNTER — Encounter: Payer: Self-pay | Admitting: Internal Medicine

## 2018-03-28 VITALS — BP 120/70 | HR 101 | Resp 16 | Ht 71.0 in

## 2018-03-28 DIAGNOSIS — I5020 Unspecified systolic (congestive) heart failure: Secondary | ICD-10-CM | POA: Diagnosis not present

## 2018-03-28 DIAGNOSIS — I5023 Acute on chronic systolic (congestive) heart failure: Secondary | ICD-10-CM

## 2018-03-28 DIAGNOSIS — J9611 Chronic respiratory failure with hypoxia: Secondary | ICD-10-CM

## 2018-03-28 DIAGNOSIS — J449 Chronic obstructive pulmonary disease, unspecified: Secondary | ICD-10-CM | POA: Diagnosis not present

## 2018-03-28 LAB — GLUCOSE, CAPILLARY: Glucose-Capillary: 169 mg/dL — ABNORMAL HIGH (ref 70–99)

## 2018-03-28 MED ORDER — FUROSEMIDE 20 MG PO TABS: 20.0000 mg | ORAL_TABLET | Freq: Every day | ORAL | 0 refills | Status: DC

## 2018-03-28 MED ORDER — CARVEDILOL 3.125 MG PO TABS: 3.1250 mg | ORAL_TABLET | Freq: Two times a day (BID) | ORAL | 0 refills | Status: DC

## 2018-03-28 MED ORDER — PREDNISONE 10 MG (21) PO TBPK: ORAL_TABLET | ORAL | 0 refills | Status: DC

## 2018-03-28 NOTE — Progress Notes (Signed)
Resumed oxygen at 2l

## 2018-03-28 NOTE — Patient Instructions (Signed)
Continue nebs a prescribed Continue oxygen as prescribed  Patient needs Cardiology ASAP

## 2018-03-28 NOTE — Care Management Important Message (Signed)
Patient discharged prior to my arrival to deliver concurrent Medicare IM.

## 2018-03-28 NOTE — Progress Notes (Signed)
Name: MUBARAK BEVENS MRN: [id number] DOB: [birth year]     CONSULTATION DATE: 03/28/2018   REFERRING MD : Grayland Ormond  CHIEF COMPLAINT: fatigue   STUDIES:  CT chest 02/2017 I have Independently reviewed images of  CT chest  Interpretation: Bilateral multifocal micronodular densities and opacifications noted   Patient underwent diagnostic bronchoscopy  05/19/17 Patient's pathology and diagnosis is consistent with respiratory bronchiolitis No evidence of malignancy final diagnosis is consistent with pigmented macrophages consistent with respiratory bronchiolitis All infectious disease studies have been negative  Pathology reports discussed with patient   PREVIOUS OV Patient underwent diagnostic bronchoscopy last week and shown to have respiratory bronchiolitis which is heavily consistent with smoking ECHO 02/2018 The cavity size was mildly dilated. Systolic   function was severely reduced. The estimated ejection fraction   was in the range of 25% to 30%. Severe diffuse hypokinesis.   Anterior wall motion best preserved. The study is not technically   sufficient to allow evaluation of LV diastolic function. - Mitral valve: There was mild regurgitation. - Left atrium: The atrium was moderately dilated. - Right ventricle: Systolic function was mildly reduced. - Pulmonary arteries: Systolic pressure could not be accurately   estimated. - Inferior vena cava: The vessel was dilated. The respirophasic   diameter changes were blunted (< 50%), consistent with elevated   central venous pressure.  CC follow up COPD Follow-up hospital admission Patient with new findings of CHF  HPI Patient with new findings on echocardiogram of EF of 25 to 30% Patient was subsequently hospitalized for a week for increased work of breathing COPD exacerbation CHF exacerbation  At this time patient is severely debilitated Chronic shortness of breath Uses nebulizer therapy for his regimen Patient uses  oxygen therapy continuously and benefits from his therapy   At this time is no signs of infection Does no signs of acute heart failure at this time Has progressive worsening of SOB/DOE  Patient also had abnormal overnight pulse oximetry and now is on 2 L nasal cannula at nighttime  pulmonary function testing on 05/13/17 FEV1 is 2.3 L which is 65% predicted FVC is 3.4 L which is 76% predicted Exline ratio is 68 TLC is 14 L which is 208% predicted RV is 10.8 L which is 42% predicted DLCO is abnormal for volumes adjusted Patient has moderate to severe obstructive pulmonary disease along with hyperinflation and air trapping with abnormal diffusion capacity  Echo report reviewed with patient He needs cardiology assessment as soon as possible   REVIEW OF SYSTEMS:   Constitutional: Negative for fever, chills, + weight loss, + malaise/fatigue and + diaphoresis.  HENT: Negative for hearing loss, ear pain, nosebleeds, congestion, sore throat, neck pain, tinnitus and ear discharge.   Eyes: Negative for blurred vision, double vision, photophobia, pain, discharge and redness.  Respiratory: Negative for cough, hemoptysis, sputum production, +shortness of breath,+ wheezing and stridor.   Cardiovascular: Negative for chest pain, palpitations, orthopnea, claudication, leg swelling and PND.  Gastrointestinal: Negative for heartburn, nausea, vomiting, abdominal pain, diarrhea, constipation, blood in stool and melena.  Genitourinary: Negative for dysuria, urgency, frequency, hematuria and flank pain.  Musculoskeletal: Negative for myalgias, back pain, joint pain and falls.  ALL OTHER ROS ARE NEGATIVE    VITAL SIGNS: BP 120/70 (BP Location: Left Arm, Cuff Size: Normal)   Pulse (!) 101   Resp 16   Ht 5\' 11"  (1.803 m)   SpO2 93%   BMI 19.93 kg/m   Physical Examination:   GENERAL:NAD, no  fevers, chills, +thin +cachexia HEAD: Normocephalic, atraumatic.  EYES: Pupils equal, round, reactive to  light. Extraocular muscles intact. No scleral icterus.  MOUTH: Moist mucosal membrane.   EAR, NOSE, THROAT: Clear without exudates. No external lesions.  NECK: Supple. No thyromegaly. No nodules. No JVD.  PULMONARY:CTA B/L no wheezes, no crackles, no rhonchi CARDIOVASCULAR: S1 and S2. Regular rate and rhythm. No murmurs, rubs, or gallops. No edema.  GASTROINTESTINAL: Soft, nontender, nondistended. No masses. Positive bowel sounds.  SKIN: No ulceration, lesions, rashes, or cyanosis. Skin warm and dry. Turgor intact.  PSYCHIATRIC: Mood, affect within normal limits. The patient is awake, alert and oriented x 3. Insight, judgment intact.        ASSESSMENT / PLAN: 67 y.o. 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 Patient now with chronic hypoxic respiratory failure on 2 L nasal cannula needs oxygen with exertyion as well and has progressive worsening SOB and DOE in the setting of severe cardiomyopathy with an ejection fraction of 25 to 30% with severe debilitating respiratory disease and cardiac disease  #1 Pathological results confirm respiratory bronchiolitis with pigmented macrophages on the diagnostic bronchoscopy which was performed on 05/19/17  #2  severe COPD Gold stage D Patient has progressed in terms of his staging of his COPD to gold stage D Patient can NOT take inhalers anymore due to resp insufficiency Continue NEB therapy with PULMICORT AND PERFOROMIST  #3 chronic hypoxic respiratory failure Continue oxygen as prescribed Patient uses and benefits from oxygen Needs o2 with exertion and at night  #4Follow-up with oncology   #5. Deconditioned state Recommend exercise as tolerated   #6 Progressive SOB and h/o CAD He has severe cardiomyopathy and needs to see cardiology ASAP   Patient satisfied with Plan of  action and management. All questions answered Follow-up in 3 months  Prognosis is very poor  Corrin Parker, M.D.  [name]  Medical Director [place] Director [facility] Cardio-Pulmonary Department

## 2018-03-28 NOTE — Progress Notes (Signed)
Progress Note  Patient Name: Tony Watson Date of Encounter: 03/28/2018  Primary Cardiologist: Fletcher Anon  Subjective   SOB improving. No chest pain. Documented net UOP of 2.4 L for the admission with 400 mL negative for the past 24 hours. No labs since 03/26/18. No weights to trend.   Inpatient Medications    Scheduled Meds: . budesonide (PULMICORT) nebulizer solution  0.25 mg Nebulization BID  . carvedilol  3.125 mg Oral BID WC  . cholecalciferol  2,000 Units Oral QPM  . enoxaparin (LOVENOX) injection  40 mg Subcutaneous Q24H  . furosemide  20 mg Oral Daily  . gabapentin  300 mg Oral QHS  . insulin aspart  0-9 Units Subcutaneous TID WC  . levalbuterol  0.63 mg Nebulization Q6H  . methylPREDNISolone (SOLU-MEDROL) injection  40 mg Intravenous Q12H  . multivitamin with minerals  1 tablet Oral Daily  . nystatin  5 mL Mouth/Throat QID  . pantoprazole  40 mg Oral Daily  . sodium chloride flush  3 mL Intravenous Q12H  . tiZANidine  4 mg Oral Nightly  . ascorbic acid  250 mg Oral BID   Continuous Infusions: . sodium chloride     PRN Meds: sodium chloride, acetaminophen **OR** acetaminophen, albuterol, LORazepam, ondansetron **OR** ondansetron (ZOFRAN) IV, oxymetazoline, pseudoephedrine, senna-docusate, sodium chloride flush   Vital Signs    Vitals:   03/28/18 0412 03/28/18 0752 03/28/18 0754 03/28/18 0813  BP: 103/69   111/84  Pulse: 84   98  Resp: 16   18  Temp: 97.7 F (36.5 C)     TempSrc: Oral     SpO2: 96% (!) 85% 92% 96%  Weight:      Height:        Intake/Output Summary (Last 24 hours) at 03/28/2018 0846 Last data filed at 03/28/2018 0400 Gross per 24 hour  Intake 1200 ml  Output 1600 ml  Net -400 ml   Filed Weights   03/24/18 1524 03/24/18 1956 03/25/18 0329  Weight: 61.7 kg 64.8 kg 64.8 kg    Telemetry    NSR to sinus tachycardia, 90s to 120s bpm, occasional PVCs - Personally Reviewed  ECG    n/a - Personally Reviewed  Physical Exam   GEN:  No acute distress.   Neck: No JVD. Cardiac: Tachycardic, I/VI systolic murmur at the apex, no rubs, or gallops.  Respiratory: Bilateral expiratory wheezing.  GI: Soft, nontender, non-distended.   MS: No edema; No deformity. Neuro:  Alert and oriented x 3; Nonfocal.  Psych: Normal affect.  Labs    Chemistry Recent Labs  Lab 03/24/18 1529 03/25/18 0458 03/26/18 0524  NA 139 138 138  K 3.5 4.0 3.9  CL 104 102 102  CO2 25 28 26   GLUCOSE 115* 209* 201*  BUN 8 13 19   CREATININE 0.61 0.73 0.68  CALCIUM 9.2 8.9 9.2  GFRNONAA >60 >60 >60  GFRAA >60 >60 >60  ANIONGAP 10 8 10      Hematology Recent Labs  Lab 03/24/18 1529 03/25/18 0458 03/26/18 0524  WBC 5.4 1.5* 10.3  RBC 3.34* 3.23* 3.34*  HGB 11.0* 10.7* 11.0*  HCT 32.5* 31.0* 32.3*  MCV 97.3 96.0 96.6  MCH 33.0 33.0 33.0  MCHC 33.9 34.4 34.1  RDW 16.6* 16.4* 16.3*  PLT 191 193 241    Cardiac Enzymes Recent Labs  Lab 03/24/18 1529  TROPONINI <0.03   No results for input(s): TROPIPOC in the last 168 hours.   BNPNo results for input(s): BNP, PROBNP  in the last 168 hours.   DDimer No results for input(s): DDIMER in the last 168 hours.   Radiology    No results found.  Cardiac Studies   Echo 03/03/2018: Study Conclusions  - Left ventricle: The cavity size was mildly dilated. Systolic   function was severely reduced. The estimated ejection fraction   was in the range of 25% to 30%. Severe diffuse hypokinesis.   Anterior wall motion best preserved. The study is not technically   sufficient to allow evaluation of LV diastolic function. - Mitral valve: There was mild regurgitation. - Left atrium: The atrium was moderately dilated. - Right ventricle: Systolic function was mildly reduced. - Pulmonary arteries: Systolic pressure could not be accurately   estimated. - Inferior vena cava: The vessel was dilated. The respirophasic   diameter changes were blunted (< 50%), consistent with elevated   central  venous pressure. __________  Patient Profile     67 y.o. 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  Assessment & Plan    1. Acute on chronic systolic CHF/ICM: -He does not appear grossly volume up -Prior EF of 53% by Myoview in 2013 -Recommend R/LHC once his AECOPD is improved -Continue Coreg 3.125 mg bid -Not on ACEi/ARB/spironolactone/Entresto given relative hypotension -Escalate evidence-based heart failure therapy as BP allows  2. CAD: -No angina -Troponin negative x 1 upon admission -Will need ischemic evaluation, he indicates he is being discharged this morning to see his pulmonologist at 10:30. If this is the case, he will need cardiology follow up as an outpatient for [facility]   3. Acute on chronic hypoxic respiratory failure with AECOPD: -Per IM  4. Frequent PVCs: -Asymptomatic  -Continue Coreg 3.125 mg bid, relative hypotension precludes escalation at this time -Check magnesium with recommendation to replete to goal > 2.0 as indicated  -Potassium 3.9 as of 03/26/18 -TSH normal 03/03/2018  5. HTN: -Blood pressure well controlled -Continue Coreg  6. HLD: -Check lipid panel  7. Noncompliance: -Compliance is advised   For questions or updates, please contact [place] Please consult [email or web address] for contact info under Cardiology/STEMI.    Signed, Christell Faith, PA-C [facility] Pager: [phone] 03/28/2018, 8:46 AM

## 2018-03-28 NOTE — Progress Notes (Signed)
IV and tele removed from patient. Discharge instructions given to patient. Verbalized understanding. No acute distress at this time. Patient wheeled down to [facility] pulmonology for patient appt. Family met at office.

## 2018-03-29 ENCOUNTER — Telehealth: Payer: Self-pay | Admitting: *Deleted

## 2018-03-29 NOTE — Telephone Encounter (Signed)
Lm requesting return call to complete TCM and confirm hosp f/u appt  

## 2018-03-30 NOTE — Discharge Summary (Signed)
Tony Watson, is a 67 y.o. male  DOB 05-Nov-1950  MRN [id number].  Admission date:  03/24/2018  Admitting Physician  Saundra Shelling, MD  Discharge Date:  03/28/2018   Primary MD  Owens Loffler, MD  Recommendations for primary care physician for things to follow: Follow-up with PCP in 1 week    Admission Diagnosis  COPD exacerbation ([place]) [J44.1]   Discharge Diagnosis  COPD exacerbation ([place]) [J44.1]    Active Problems:   COPD exacerbation ([place])      Past Medical History:  Diagnosis Date  . Arthritis   . CAD (coronary artery disease)    inferior MI 11/99 (in [place]) with PCI to [facility]. last [place] 2004 [facility]): EF 50%, patent RCA stent. no obstructive disease. last myoview 2008: EF 42%, inferior infarct, no ischemia.   . Cancer ([place])    CMML [place]  . CHF (congestive heart failure) ([place])   . COPD (chronic obstructive pulmonary disease) ([place])   . Diabetic autonomic neuropathy associated with type 2 diabetes mellitus ([place]) 03/27/2015  . Diverticulosis 2002  . DM2 (diabetes mellitus, type 2) (H[facility])    A1c 6.0% 12/11  . GERD (gastroesophageal reflux disease)   . HTN (hypertension)   . Hyperlipidemia   . Ischemic cardiomyopathy    mild with EF 42% on myoviews 2008.  . Medical non-compliance   . Nicotine dependence    chronic, active  . Smoker   . Spinal stenosis   . 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 TIA (transient ischemic attack)    hx; now s/p PFO closure 2004 (in [place])  . Ulcer     Past Surgical History:  Procedure Laterality Date  . BACK SURGERY     patient denies-just lumbar punctures  . BRONCHOSCOPY    . CARDIAC CATHETERIZATION  11/99   CI per PMH  . CATARACT EXTRACTION W/PHACO Right 11/24/2016   Procedure: CATARACT EXTRACTION PHACO AND INTRAOCULAR LENS PLACEMENT (IOC);  Surgeon: Birder Robson, MD;  Location: [facility] ORS;  Service: Ophthalmology;  Laterality: Right;  [place] 1:04.3 AP% 22.3 CDE 14.35 Fluid pack lot # [id number]  . CATARACT EXTRACTION W/PHACO Left 12/29/2016   Procedure: CATARACT EXTRACTION PHACO AND INTRAOCULAR LENS PLACEMENT (IOC);  Surgeon: Birder Robson, MD;  Location: [facility] ORS;  Service: Ophthalmology;  Laterality: Left;  [place] 00:52 AP% 18.5 CDE 9.67 Fluid pack lot # [id number]  . COLONOSCOPY    . COLONOSCOPY WITH PROPOFOL N/A 05/25/2017   Procedure: COLONOSCOPY WITH PROPOFOL;  Surgeon: Jonathon Bellows, MD;  Location: [facility] ENDOSCOPY;  Service: Gastroenterology;  Laterality: N/A;  . CORONARY ANGIOPLASTY     STENT  . CORONARY ARTERY BYPASS GRAFT     stent  . ESOPHAGOGASTRODUODENOSCOPY (EGD) WITH PROPOFOL N/A 05/25/2017   Procedure: ESOPHAGOGASTRODUODENOSCOPY (EGD) WITH PROPOFOL;  Surgeon: Jonathon Bellows, MD;  Location: [facility] ENDOSCOPY;  Service: Gastroenterology;  Laterality: N/A;  . EYE SURGERY    . FLEXIBLE BRONCHOSCOPY N/A 05/19/2017   Procedure: FLEXIBLE BRONCHOSCOPY;  Surgeon: Wilhelmina Mcardle, MD;  Location: [facility] ORS;  Service: Pulmonary;  Laterality: N/A;  . open heart surgery  2004   PFO repair  . UPPER GI ENDOSCOPY         History of present illness and  Hospital Course:     Kindly see H&P for history of present illness and admission details, please review complete Labs, Consult reports and Test reports for all details in brief  HPI  from the history and physical done on the day  of admission  67 year old male patient with history of severe COPD Gold stage D as per pulmonary note, chronic, progressive worsening of shortness of breath , deconditioning.  Patient admitted for COPD exacerbation.  Hospital Course  Acute on chronic respiratory failure due to COPD exacerbation: Received IV steroids, bronchodilators, continued on oxygen patient chronically on 2 L of oxygen.  Sees Dr. Mortimer Fries as an outpatient.  Patient wheezing improved, discharged home in stable  condition ,patient has appointment with Dr. Mortimer Fries on Aug 19th at 10.30 a.m. patient discharged and escortedhis appointment after that he went home.  Antibiotics are not given because of there is no evidence of pneumonia.  .  Patient is given a course of prednisone Dosepak at discharge.  Advised the patient to continue t Pulmicort, nebs,performist as per pulmonary,. 2.  Chronic systolic heart failure with EF 25 to 30%, patient chest x-ray showed mild CHF.  Patient received IV diuretics, change to p.o. diuretics, seen by [facility] health cardiology Dr. Minus Breeding, did not recommend invasive cardiac work-up because patient EKG normal, because of his severe respiratory condition cardiologist recommend to continue diuretics, small dose beta-blockers without need for further cardiac intervention at this time.  Patient discharged home with small dose Coreg, Lasix, patient disease inhibitors/ARB/outpatient with follow-up with cardiology.  Unable to start them this admission  because of relative hypotension. 3.  Severe deconditioning, weight loss: Patient had a bronchoscopy in October 2018 which is negative for malignancy but it showed bronchiolitis.  Patient was a heavy smoker before.  He also has a history of indolent CML, follows up with Dr. Mike Gip. #4 severe malnutrition, the context of chronic illness seen by dietitian during last admission, recommended boost plus p.o. twice daily, Magic cups 3 times daily with meals, Beneprotein, liberalizing the diet.    Discharge Condition: Stable   Follow UP  Follow-up Information    Copland, Spencer, MD. Schedule an appointment as soon as possible for a visit on 04/06/2018.   Specialty:  Family Medicine Why:  Appointment Time:@ 11:40am Contact information: [place] [address] [phone]        Wellington Hampshire, MD On 05/02/2018.   Specialty:  Cardiology Why:  Appointment Time: @ 1:00pm with Jorja Loa Contact information: [place] [address] [phone]             Discharge Instructions  and  Discharge Medications      Allergies as of 03/28/2018   No Known Allergies     Medication List    TAKE these medications   acetaminophen 500 MG tablet Commonly known as:  TYLENOL Take 1,000 mg by mouth daily as needed for moderate pain or headache.   albuterol (2.5 MG/3ML) 0.083% nebulizer solution Commonly known as:  PROVENTIL Take 3 mLs (2.5 mg total) by nebulization every 4 (four) hours as needed for wheezing or shortness of breath.   ascorbic acid 250 MG tablet Commonly known as:  VITAMIN C Take 1 tablet (250 mg total) by mouth 2 (two) times daily.   budesonide 0.5 MG/2ML nebulizer solution Commonly known as:  PULMICORT Take 2 mLs (0.5 mg total) by nebulization 2 (two) times daily.   carvedilol 3.125 MG tablet Commonly known as:  COREG Take 1 tablet (3.125 mg total) by mouth 2 (two) times daily with a meal.   formoterol 20 MCG/2ML nebulizer solution Commonly known as:  PERFOROMIST Take 2 mLs (20 mcg total) by nebulization 2 (two) times daily.  furosemide 20 MG tablet Commonly known as:  LASIX Take 1 tablet (20 mg total) by mouth daily.   gabapentin 300 MG capsule Commonly known as:  NEURONTIN Take 1 capsule (300 mg total) by mouth at bedtime.   ipratropium-albuterol 0.5-2.5 (3) MG/3ML Soln Commonly known as:  DUONEB Take 3 mLs by nebulization every 4 (four) hours as needed.   LORazepam 0.5 MG tablet Commonly known as:  ATIVAN Take 1 tablet (0.5 mg total) by mouth every 6 (six) hours as needed for anxiety.   megestrol 40 MG tablet Commonly known as:  MEGACE Take 1 tablet (40 mg total) by mouth daily.   multivitamin with minerals Tabs tablet Take 1 tablet by mouth daily.   nystatin 100000 UNIT/ML suspension Commonly known as:  MYCOSTATIN Use as directed 5 mLs (500,000 Units total) in the mouth or throat 4 (four) times daily.   omeprazole 20  MG capsule Commonly known as:  PRILOSEC Take 20 mg by mouth daily.   oxymetazoline 0.05 % nasal spray Commonly known as:  AFRIN Place 1 spray into both nostrils daily as needed for congestion.   predniSONE 10 MG (21) Tbpk tablet Commonly known as:  STERAPRED UNI-PAK 21 TAB Taper by 10 mg po daily   protein supplement Powd Take 12-18 g by mouth 3 (three) times daily with meals.   pseudoephedrine 30 MG tablet Commonly known as:  SUDAFED Take 30 mg by mouth daily as needed for congestion.   tiZANidine 4 MG tablet Commonly known as:  ZANAFLEX Take 1 tablet (4 mg total) by mouth Nightly.   Vitamin D 2000 units Caps Take 2,000 Units by mouth every evening.         Diet and Activity recommendation: See Discharge Instructions above   Consults obtained -cardiology   Major procedures and Radiology Reports - PLEASE review detailed and final reports for all details, in brief -      Dg Chest 2 View  Result Date: 03/24/2018 CLINICAL DATA:  Shortness of breath, chest tightness. EXAM: CHEST - 2 VIEW COMPARISON:  03/03/2018 FINDINGS: Postsurgical changes from median sternotomy, stable. Cardiomediastinal silhouette is normal. Mediastinal contours appear intact. There is no evidence of pleural effusion or pneumothorax. Worsened aeration of the lungs with increased interstitial thickening, superimposed to previously demonstrated interstitial lung disease. Osseous structures are without acute abnormality. Soft tissues are grossly normal. IMPRESSION: Increased interstitial thickening, superimposed to previously demonstrated cystic interstitial lung disease. No evidence of pneumothorax. Electronically Signed   By: Fidela Salisbury M.D.   On: 03/24/2018 16:13   Dg Chest Port 1 View  Result Date: 03/03/2018 CLINICAL DATA:  Short of breath EXAM: PORTABLE CHEST 1 VIEW COMPARISON:  02/27/2018, CT chest 05/13/2017 FINDINGS: Diffuse interstitial lung disease. No confluent opacity. Normal heart  size. Post sternotomy changes. No pneumothorax. IMPRESSION: Similar appearance of diffuse interstitial lung disease. No acute focal airspace disease or pleural effusion is seen. Electronically Signed   By: Donavan Foil M.D.   On: 03/03/2018 01:36    Micro Results  Pending   No results found for this or any previous visit (from the past 240 hour(s)).     Today   Subjective:   Tony Watson today has no headache,no chest abdominal pain,no new weakness tingling or numbness, feels much better wants to go home today.   Objective:   Blood pressure 111/84, pulse 98, temperature 97.7 F (36.5 C), temperature source Oral, resp. rate 18, height _0  (1.803 m), weight 64.8 kg, SpO2 96 %.  No intake or output data in the 24 hours ending 03/30/18 2006  Exam Awake Alert, Oriented x 3, No new F.N deficits, Normal affect [place].AT,PERRAL Supple Neck,No JVD, No cervical lymphadenopathy appriciated.  Symmetrical Chest wall movement, Good air movement bilaterally, CTAB RRR,No Gallops,Rubs or new Murmurs, No Parasternal Heave +ve B.Sounds, Abd Soft, Non tender, No organomegaly appriciated, No rebound -guarding or rigidity. No Cyanosis, Clubbing or edema, No new Rash or bruise  Data Review   CBC w Diff:  Lab Results  Component Value Date   WBC 10.3 03/26/2018   HGB 11.0 (L) 03/26/2018   HGB 13.4 01/21/2014   HCT 32.3 (L) 03/26/2018   HCT 38.7 (L) 01/21/2014   PLT 241 03/26/2018   PLT 141 (L) 01/21/2014   LYMPHOPCT 19 01/17/2018   LYMPHOPCT 17.1 02/09/2013   BANDSPCT 3 10/18/2017   MONOPCT 49 01/17/2018   MONOPCT 32.6 02/09/2013   EOSPCT 1 01/17/2018   EOSPCT 0.2 02/09/2013   BASOPCT 1 01/17/2018   BASOPCT 0.2 02/09/2013    CMP:  Lab Results  Component Value Date   NA 138 03/26/2018   NA 139 01/21/2014   K 3.9 03/26/2018   K 3.7 01/21/2014   CL 102 03/26/2018   CL 107 01/21/2014   CO2 26 03/26/2018   CO2 25 01/21/2014   BUN 19 03/26/2018   BUN 14 01/21/2014    CREATININE 0.68 03/26/2018   CREATININE 0.91 01/21/2014   PROT 9.4 (H) 06/14/2017   PROT 9.5 (H) 02/05/2013   ALBUMIN 3.1 (L) 06/14/2017   ALBUMIN 4.6 02/05/2013   BILITOT 0.5 06/14/2017   BILITOT 0.6 02/05/2013   ALKPHOS 83 06/14/2017   ALKPHOS 46 (L) 02/05/2013   AST 29 06/14/2017   AST 42 (H) 02/05/2013   ALT 25 06/14/2017   ALT 51 02/05/2013  .   Total Time in preparing paper work, data evaluation and todays exam - 35 minutes  Epifanio Lesches M.D on 03/28/2018 at 8:06 PM    Note: This dictation was prepared with Dragon dictation along with smaller phrase technology. Any transcriptional errors that result from this process are unintentional.

## 2018-03-31 ENCOUNTER — Telehealth: Payer: Self-pay

## 2018-03-31 NOTE — Telephone Encounter (Signed)
Lm requesting return call to complete TCM and confirm hosp f/u appt  

## 2018-03-31 NOTE — Telephone Encounter (Signed)
Flagged on EMMI report for having unfilled prescriptions.  First attempt to reach patient made 03/31/18 at 2:24pm, however unable to reach patient.  Left voicemail encouraging callback. Will attempt at later time.

## 2018-04-01 ENCOUNTER — Telehealth: Payer: Self-pay

## 2018-04-01 NOTE — Telephone Encounter (Signed)
EMMI Follow-up: 2nd attempt to contact patient regarding unfilled Rx's and left a message with my contact information for Mr. Wion to call me at his convenience.

## 2018-04-01 NOTE — Progress Notes (Signed)
Cardiology Office Note  Date:  04/04/2018   ID:  Tony Watson, DOB 1951/07/14, MRN [id number]  PCP:  Owens Loffler, MD   Chief Complaint  Patient presents with  . Other    Follow up from [name]. Patient C/o "always being short of breath." seen for COPD exacerbation  Acute resp Distress. Meds reviewed verbally with patient.     HPI:  67 year old male  coronary artery disease  status post inferior myocardial infarction in 1999  PCI to the mid RCA  recurrent TIAs with surgical PFO closure in 2004.  hyperlipidemia,  hypertension  diabetes.  Smoker, quit 2 months ago Ejection fraction 25-30%  compliance issues with medications.   Recent hospitalization August 2019 for COPD exacerbation Hospital records reviewed with the patient in detail Receive steroids bronchodilators No antibiotics as it was felt to be no pneumonia Prednisone at discharge Unable to advance medications for heart failure given low blood pressure  Long discussion concerning recent echocardiogram findings Ejection fraction 25-30%  He continues to have shortness of breath with exertion 3 L nasal cannula oxygen today in the office  Denies any chest pain concerning for angina Might have tachycardia with his albuterol nebulizer  Previous testing stress test was in 2008 which showed evidence of inferior infarct without significant ischemia. Ejection fraction was 42%.  EKG personally reviewed by myself on todays visit Shows sinus tachycardia rate 105 bpm  PMH:   has a past medical history of Arthritis, CAD (coronary artery disease), Cancer ([place]), CHF (congestive heart failure) ([place]), COPD (chronic obstructive pulmonary disease) ([place]), Diabetic autonomic neuropathy associated with type 2 diabetes mellitus ([place]) (03/27/2015), Diverticulosis (2002), DM2 (diabetes mellitus, type 2) ([place]), GERD (gastroesophageal reflux disease), HTN (hypertension), Hyperlipidemia, Ischemic cardiomyopathy, Medical non-compliance,  Nicotine dependence, Smoker, Spinal stenosis, Stroke ([place]), TIA (transient ischemic attack), and Ulcer.  PSH:    Past Surgical History:  Procedure Laterality Date  . BACK SURGERY     patient denies-just lumbar punctures  . BRONCHOSCOPY    . CARDIAC CATHETERIZATION  11/99   CI per PMH  . CATARACT EXTRACTION W/PHACO Right 11/24/2016   Procedure: CATARACT EXTRACTION PHACO AND INTRAOCULAR LENS PLACEMENT (IOC);  Surgeon: Birder Robson, MD;  Location: [facility] ORS;  Service: Ophthalmology;  Laterality: Right;  [place] 1:04.3 AP% 22.3 CDE 14.35 Fluid pack lot # [id number]  . CATARACT EXTRACTION W/PHACO Left 12/29/2016   Procedure: CATARACT EXTRACTION PHACO AND INTRAOCULAR LENS PLACEMENT (IOC);  Surgeon: Birder Robson, MD;  Location: [facility] ORS;  Service: Ophthalmology;  Laterality: Left;  [place] 00:52 AP% 18.5 CDE 9.67 Fluid pack lot # [id number] H  . COLONOSCOPY    . COLONOSCOPY WITH PROPOFOL N/A 05/25/2017   Procedure: COLONOSCOPY WITH PROPOFOL;  Surgeon: Jonathon Bellows, MD;  Location: [facility] ENDOSCOPY;  Service: Gastroenterology;  Laterality: N/A;  . CORONARY ANGIOPLASTY     STENT  . CORONARY ARTERY BYPASS GRAFT     stent  . ESOPHAGOGASTRODUODENOSCOPY (EGD) WITH PROPOFOL N/A 05/25/2017   Procedure: ESOPHAGOGASTRODUODENOSCOPY (EGD) WITH PROPOFOL;  Surgeon: Jonathon Bellows, MD;  Location[facility] ENDOSCOPY;  Service: Gastroenterology;  Laterality: N/A;  . EYE SURGERY    . FLEXIBLE BRONCHOSCOPY N/A 05/19/2017   Procedure: FLEXIBLE BRONCHOSCOPY;  Surgeon: Wilhelmina Mcardle, MD;  Location: [facility] ORS;  Service: Pulmonary;  Laterality: N/A;  . open heart surgery  2004   PFO repair  . UPPER GI ENDOSCOPY      Current Outpatient Medications  Medication Sig Dispense Refill  . acetaminophen (TYLENOL) 500 MG tablet Take 1,000 mg by mouth  daily as needed for moderate pain or headache.    . albuterol (PROVENTIL) (2.5 MG/3ML) 0.083% nebulizer solution Take 3 mLs (2.5 mg total) by nebulization every 4 (four) hours as needed  for wheezing or shortness of breath. 75 mL 0  . budesonide (PULMICORT) 0.5 MG/2ML nebulizer solution Take 2 mLs (0.5 mg total) by nebulization 2 (two) times daily. 1440 mL 0  . carvedilol (COREG) 3.125 MG tablet Take 1 tablet (3.125 mg total) by mouth 2 (two) times daily with a meal. 60 tablet 0  . Cholecalciferol (VITAMIN D) 2000 units CAPS Take 2,000 Units by mouth every evening.    . formoterol (PERFOROMIST) 20 MCG/2ML nebulizer solution Take 2 mLs (20 mcg total) by nebulization 2 (two) times daily. 500 mL 12  . furosemide (LASIX) 20 MG tablet Take 1 tablet (20 mg total) by mouth daily. 30 tablet 0  . gabapentin (NEURONTIN) 300 MG capsule Take 1 capsule (300 mg total) by mouth at bedtime. 90 capsule 1  . ipratropium-albuterol (DUONEB) 0.5-2.5 (3) MG/3ML SOLN Take 3 mLs by nebulization every 4 (four) hours as needed.  5  . LORazepam (ATIVAN) 0.5 MG tablet Take 1 tablet (0.5 mg total) by mouth every 6 (six) hours as needed for anxiety. 30 tablet 1  . megestrol (MEGACE) 40 MG tablet Take 1 tablet (40 mg total) by mouth daily. 30 tablet 2  . Multiple Vitamin (MULTIVITAMIN WITH MINERALS) TABS tablet Take 1 tablet by mouth daily. 30 tablet 0  . nystatin (MYCOSTATIN) 100000 UNIT/ML suspension Use as directed 5 mLs (500,000 Units total) in the mouth or throat 4 (four) times daily. 473 mL 1  . omeprazole (PRILOSEC) 20 MG capsule Take 20 mg by mouth daily.    [place] Kitchen oxymetazoline (AFRIN) 0.05 % nasal spray Place 1 spray into both nostrils daily as needed for congestion.    . protein supplement (RESOURCE BENEPROTEIN) POWD Take 12-18 g by mouth 3 (three) times daily with meals. 227 g 0  . pseudoephedrine (SUDAFED) 30 MG tablet Take 30 mg by mouth daily as needed for congestion.     [place] Kitchen tiZANidine (ZANAFLEX) 4 MG tablet Take 1 tablet (4 mg total) by mouth Nightly. 90 tablet 3  . vitamin C (VITAMIN C) 250 MG tablet Take 1 tablet (250 mg total) by mouth 2 (two) times daily. 30 tablet 0   No current  facility-administered medications for this visit.      Allergies:   Patient has no known allergies.   Social History:  The patient  reports that he quit smoking about 2 months ago. His smoking use included cigarettes. He has a 22.50 pack-year smoking history. He has never used smokeless tobacco. He reports that he drank alcohol. He reports that he does not use drugs.   Family History:   family history includes Breast cancer in his unknown relative; Cancer in his mother; Coronary artery disease in his unknown relative; Heart disease in his father.    Review of Systems: Review of Systems  Constitutional: Negative.        Weight loss  Respiratory: Positive for shortness of breath.   Cardiovascular: Negative.   Gastrointestinal: Negative.   Musculoskeletal: Negative.   Neurological: Negative.   Psychiatric/Behavioral: Negative.   All other systems reviewed and are negative.    PHYSICAL EXAM: VS:  BP 118/60 (BP Location: Right Arm, Patient Position: Sitting, Cuff Size: Normal)   Pulse (!) 105   Ht 5' 11.5" (1.816 m)   Wt 145 lb 8 oz (66  kg)   BMI 20.01 kg/m  , BMI Body mass index is 20.01 kg/m. GEN:  in no acute distress , very thin presenting a wheelchair, nasal cannula oxygen in place HEENT: normal  Neck: no JVD, carotid bruits, or masses Cardiac: RRR; no murmurs, rubs, or gallops,no edema  Respiratory: moderately decreased breath sounds throughout, mild distress GI: soft, nontender, nondistended, + BS MS: no deformity,+  muscle atrophy Skin: warm and dry, no rash Neuro:  Strength and sensation are intact Psych: euthymic mood, full affect    Recent Labs: 06/14/2017: ALT 25 03/03/2018: B Natriuretic Peptide 1,011.0; TSH 1.930 03/26/2018: BUN 19; Creatinine, Ser 0.68; Hemoglobin 11.0; Platelets 241; Potassium 3.9; Sodium 138    Lipid Panel Lab Results  Component Value Date   CHOL 144 07/16/2016   HDL 32.90 (L) 07/16/2016   LDLCALC 79 07/16/2016   TRIG 159.0 (H)  07/16/2016      Wt Readings from Last 3 Encounters:  04/04/18 145 lb 8 oz (66 kg)  03/25/18 142 lb 14.4 oz (64.8 kg)  03/10/18 136 lb 8 oz (61.9 kg)       ASSESSMENT AND PLAN:  Coronary artery disease of native artery of native heart with stable angina pectoris ([place]) - Plan: EKG 12-Lead Decline in ejection fraction concerning for ischemic cardiomyopathy We discussed noninvasive versus invasive imaging He has discussed this extensively with his wife who presents today to the office He prefers noninvasive imaging such as stress testing/lexiscan  Systolic heart failure secondary to coronary artery disease ([place]) Appears relatively euvolemic on today's visit Ejection fraction depressed, discussed with patient Unable to rule out ischemia as detailed above. Stress test ordered We'll increased carvedilol up to 6.25 mg twice a day  Type 2 diabetes mellitus with vascular disease ([place]) Working with a nutritionist to gain weight  COPD exacerbation ([place]) Lifelong smoking history End-stage lung disease Suggested he try Xopenex instead of albuterol He will discuss with pulmonary  Mixed hyperlipidemia Continue statin  TOBACCO USE Reports he stopped smoking 2 months ago  Ischemic cardiomyopathy Stress test ordered as above  Disposition:   F/U  6 months   Total encounter time more than 45 minutes  Greater than 50% was spent in counseling and coordination of care with the patient    Orders Placed This Encounter  Procedures  . EKG 12-Lead     Signed, Esmond Plants, M.D., Ph.D. 04/04/2018  [place], [place]

## 2018-04-04 ENCOUNTER — Encounter: Payer: Self-pay | Admitting: Cardiovascular Disease

## 2018-04-04 ENCOUNTER — Ambulatory Visit (INDEPENDENT_AMBULATORY_CARE_PROVIDER_SITE_OTHER): Payer: Medicare HMO | Admitting: Cardiovascular Disease

## 2018-04-04 VITALS — BP 118/60 | HR 105 | Ht 71.5 in | Wt 145.5 lb

## 2018-04-04 DIAGNOSIS — I2510 Atherosclerotic heart disease of native coronary artery without angina pectoris: Secondary | ICD-10-CM

## 2018-04-04 DIAGNOSIS — E782 Mixed hyperlipidemia: Secondary | ICD-10-CM

## 2018-04-04 DIAGNOSIS — E1159 Type 2 diabetes mellitus with other circulatory complications: Secondary | ICD-10-CM

## 2018-04-04 DIAGNOSIS — F17200 Nicotine dependence, unspecified, uncomplicated: Secondary | ICD-10-CM

## 2018-04-04 DIAGNOSIS — J441 Chronic obstructive pulmonary disease with (acute) exacerbation: Secondary | ICD-10-CM | POA: Diagnosis not present

## 2018-04-04 DIAGNOSIS — I5020 Unspecified systolic (congestive) heart failure: Secondary | ICD-10-CM

## 2018-04-04 DIAGNOSIS — I25118 Atherosclerotic heart disease of native coronary artery with other forms of angina pectoris: Secondary | ICD-10-CM | POA: Diagnosis not present

## 2018-04-04 DIAGNOSIS — E1143 Type 2 diabetes mellitus with diabetic autonomic (poly)neuropathy: Secondary | ICD-10-CM

## 2018-04-04 DIAGNOSIS — I255 Ischemic cardiomyopathy: Secondary | ICD-10-CM

## 2018-04-04 MED ORDER — CARVEDILOL 6.25 MG PO TABS: 6.2500 mg | ORAL_TABLET | Freq: Two times a day (BID) | ORAL | 3 refills | Status: DC

## 2018-04-04 NOTE — Patient Instructions (Addendum)
Medication Instructions:   Increase the coreg up to 6.25 mg twice a day  Ask Dr. Mortimer Fries about xopenex instead of albuterol nebs  Labwork:  No new labs needed  Testing/Procedures:  We will order a lexiscan myoview for cardiomyopathy, CAD [place]  Your caregiver has ordered a Stress Test with nuclear imaging. The purpose of this test is to evaluate the blood supply to your heart muscle. This procedure is referred to as a "Non-Invasive Stress Test." This is because other than having an IV started in your vein, nothing is inserted or "invades" your body. Cardiac stress tests are done to find areas of poor blood flow to the heart by determining the extent of coronary artery disease (CAD). Some patients exercise on a treadmill, which naturally increases the blood flow to your heart, while others who are  unable to walk on a treadmill due to physical limitations have a pharmacologic/chemical stress agent called Lexiscan . This medicine will mimic walking on a treadmill by temporarily increasing your coronary blood flow.   Please note: these test may take anywhere between 2-4 hours to complete  PLEASE REPORT TO [place] AT THE FIRST DESK WILL DIRECT YOU WHERE TO GO  Date of Procedure:_____________________________________  Arrival Time for Procedure:______________________________  Instructions regarding medication:   _XX___ : Hold diabetes medication morning of procedure  _XX___:  Hold Carvedilol the night before procedure and morning of procedure   PLEASE NOTIFY THE OFFICE AT LEAST 24 HOURS IN ADVANCE IF YOU ARE UNABLE TO KEEP YOUR APPOINTMENT.  [phone] AND  PLEASE NOTIFY NUCLEAR MEDICINE AT [facility] AT LEAST 24 HOURS IN ADVANCE IF YOU ARE UNABLE TO KEEP YOUR APPOINTMENT. [phone]  How to prepare for your Myoview test:  1. Do not eat or drink after midnight 2. No caffeine for 24 hours prior to test 3. No smoking 24 hours prior to  test. 4. Your medication may be taken with water.  If your doctor stopped a medication because of this test, do not take that medication. 5. Ladies, please do not wear dresses.  Skirts or pants are appropriate. Please wear a short sleeve shirt. 6. No perfume, cologne or lotion. 7. Wear comfortable walking shoes. No heels!   Follow-Up: It was a pleasure seeing you in the office today. Please call us if you have new issues that need to be addressed before your next appt.  [phone]  Your physician wants you to follow-up in: 6 months.  You will receive a reminder letter in the mail two months in advance. If you don't receive a letter, please call our office to schedule the follow-up appointment.  If you need a refill on your cardiac medications before your next appointment, please call your pharmacy.  For educational health videos Log in to : [email or web address] Or : [email or web address], password : [email or web address]

## 2018-04-05 NOTE — Progress Notes (Signed)
Dr. Frederico Hamman T. Marlies Ligman, MD, [facility] [place], [address] Phone: [phone] Fax: [phone]  04/06/2018  Patient: Tony Watson, MRN: [id number], DOB: 01/19/1951, 67 y.o.  Primary Physician:  Owens Loffler, MD   Chief Complaint  Patient presents with  . Hospitalization Follow-up   Subjective:   Tony Watson is a 67 y.o. very pleasant male patient who presents with the following:  [facility]N f/u  Admission date:  03/24/2018  Admitting Physician  Saundra Shelling, MD  Discharge Date:  03/28/2018   He has a known history of chronic respiratory failure, and he is dependent on oxygen, and he was admitted again for a COPD exacerbation.  He also had acute heart failure and was admitted with heart failure exacerbation.  He has had problems paying for his medications, and ultimately had to change some of what he has been recommended by his various practitioners.  He was given steroids in the hospital and ultimately discharged on his home oxygen and nebulizer protocol.  Malnourished and on Megace.   Chronic CML.  Past Medical History, Surgical History, Social History, Family History, Problem List, Medications, and Allergies have been reviewed and updated if relevant.  Patient Active Problem List   Diagnosis Date Noted  . Respiratory failure with hypoxia ([place]) 03/03/2018    Priority: High  . Protein-calorie malnutrition, severe 03/03/2018    Priority: High  . 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 Severe chronic obstructive pulmonary disease ([place]) 03/23/2011    Priority: High  . Type 2 diabetes mellitus with vascular disease ([place]) 01/02/2010    Priority: High  . Ischemic cardiomyopathy 03/24/2018  . Diabetic autonomic neuropathy associated with type 2 diabetes mellitus ([place]) 03/27/2015  . History of inferior MI (myocardial infarction) 10/14/2013  .  CAD (coronary artery disease)   . Hypertensive heart disease   . History of noncompliance with medical treatment 05/30/2012  . Systolic heart failure secondary to coronary artery disease ([place]) 05/30/2012  . Other emphysema ([place]) 03/23/2011  . ALLERGIC RHINITIS CAUSE UNSPECIFIED 07/31/2010  . TOBACCO USE 04/17/2009  . TRANSIENT ISCHEMIC ATTACKS, HX OF 04/17/2009  . Hyperlipidemia 12/19/2008    Past Medical History:  Diagnosis Date  . Arthritis   . CAD (coronary artery disease)    inferior MI 11/99 (in [place]) with PCI to [facility]. last [place] 2004 [facility]): EF 50%, patent RCA stent. no obstructive disease. last myoview 2008: EF 42%, inferior infarct, no ischemia.   . Cancer ([place])    CMML [place]  . CHF (congestive heart failure) ([place])   . COPD (chronic obstructive pulmonary disease) ([place])   . Diabetic autonomic neuropathy associated with type 2 diabetes mellitus ([place]) 03/27/2015  . Diverticulosis 2002  . DM2 (diabetes mellitus, type 2) ([facility])    A1c 6.0% 12/11  . GERD (gastroesophageal reflux disease)   . HTN (hypertension)   . Hyperlipidemia   . Ischemic cardiomyopathy    mild with EF 42% on myoviews 2008.  . Medical non-compliance   . Nicotine dependence    chronic, active  . Smoker   . Spinal stenosis   . 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 TIA (transient ischemic attack)    hx; now s/p PFO closure 2004 (in [place])  . Ulcer     Past Surgical History:  Procedure Laterality Date  . BACK SURGERY     patient denies-just lumbar  punctures  . BRONCHOSCOPY    . CARDIAC CATHETERIZATION  11/99   CI per [facility]  . CATARACT EXTRACTION W/PHACO Right 11/24/2016   Procedure: CATARACT EXTRACTION PHACO AND INTRAOCULAR LENS PLACEMENT (IOC);  Surgeon: Birder Robson, MD;  Location: [facility] ORS;  Service: Ophthalmology;  Laterality: Right;  [place] 1:04.3 AP% 22.3 CDE 14.35 Fluid pack lot # [id number]  . CATARACT EXTRACTION W/PHACO Left 12/29/2016   Procedure: CATARACT EXTRACTION PHACO AND  INTRAOCULAR LENS PLACEMENT (IOC);  Surgeon: Birder Robson, MD;  Location: [facility] ORS;  Service: Ophthalmology;  Laterality: Left;  [place] 00:52 AP% 18.5 CDE 9.67 Fluid pack lot # [id number]  . COLONOSCOPY    . COLONOSCOPY WITH PROPOFOL N/A 05/25/2017   Procedure: COLONOSCOPY WITH PROPOFOL;  Surgeon: Jonathon Bellows, MD;  Location[facility] ENDOSCOPY;  Service: Gastroenterology;  Laterality: N/A;  . CORONARY ANGIOPLASTY     STENT  . CORONARY ARTERY BYPASS GRAFT     stent  . ESOPHAGOGASTRODUODENOSCOPY (EGD) WITH PROPOFOL N/A 05/25/2017   Procedure: ESOPHAGOGASTRODUODENOSCOPY (EGD) WITH PROPOFOL;  Surgeon: Jonathon Bellows, MD;  Location: [facility] ENDOSCOPY;  Service: Gastroenterology;  Laterality: N/A;  . EYE SURGERY    . FLEXIBLE BRONCHOSCOPY N/A 05/19/2017   Procedure: FLEXIBLE BRONCHOSCOPY;  Surgeon: Wilhelmina Mcardle, MD;  Location: [facility] ORS;  Service: Pulmonary;  Laterality: N/A;  . open heart surgery  2004   PFO repair  . UPPER GI ENDOSCOPY      Social History   Socioeconomic History  . Marital status: Married    Spouse name: Not on file  . Number of children: Not on file  . Years of education: Not on file  . Highest education level: Not on file  Occupational History  . Not on file  Social Needs  . Financial resource strain: Not on file  . Food insecurity:    Worry: Not on file    Inability: Not on file  . Transportation needs:    Medical: Not on file    Non-medical: Not on file  Tobacco Use  . Smoking status: Former Smoker    Packs/day: 0.50    Years: 45.00    Pack years: 22.50    Types: Cigarettes    Last attempt to quit: 01/25/2018    Years since quitting: 0.1  . Smokeless tobacco: Never Used  . Tobacco comment: 1 ppd +40 years  Substance and Sexual Activity  . Alcohol use: Not Currently    Alcohol/week: 0.0 standard drinks    Comment: weekly but last dose 1 month  . Drug use: No  . Sexual activity: Not on file  Lifestyle  . Physical activity:    Days per week: Not on file     Minutes per session: Not on file  . Stress: Not on file  Relationships  . Social connections:    Talks on phone: Not on file    Gets together: Not on file    Attends religious service: Not on file    Active member of club or organization: Not on file    Attends meetings of clubs or organizations: Not on file    Relationship status: Not on file  . Intimate partner violence:    Fear of current or ex partner: Not on file    Emotionally abused: Not on file    Physically abused: Not on file    Forced sexual activity: Not on file  Other Topics Concern  . Not on file  Social History Narrative   Married, 1 daughter.  Lives in  [place], [place]    Family History  Problem Relation Age of Onset  . Coronary artery disease Unknown        family hx  . Breast cancer Unknown        1st egree relative <50  . Cancer Mother   . Heart disease Father     No Known Allergies  Medication list reviewed and updated in full in [place].   GEN: No acute illnesses, no fevers, chills. GI: No n/v/d, eating normally Pulm: No SOB Interactive and getting along well at home.  Otherwise, ROS is as per the HPI.  Objective:   BP 110/60   Pulse (!) 103   Temp 97.7 F (36.5 C) (Oral)   Ht _0  (1.803 m)   Wt 145 lb 8 oz (66 kg)   SpO2 90% Comment: 2 L O2  BMI 20.29 kg/m   GEN: WDWN, NAD, Non-toxic, A & O x 3. Cachectic. HEENT: Atraumatic, Normocephalic. Neck supple. No masses, No LAD. Ears and Nose: No external deformity. CV: RRR, No M/G/R. No JVD. No thrill. No extra heart sounds. PULM: CTA B, no wheezes, crackles, rhonchi. No retractions. No resp. distress. No accessory muscle use. EXTR: No c/c/e NEURO Normal gait.  PSYCH: Normally interactive. Conversant. Not depressed or anxious appearing.  Calm demeanor.   Laboratory and Imaging Data: Dg Chest 2 View  Result Date: 03/24/2018 CLINICAL DATA:  Shortness of breath, chest tightness. EXAM: CHEST - 2 VIEW COMPARISON:  03/03/2018  FINDINGS: Postsurgical changes from median sternotomy, stable. Cardiomediastinal silhouette is normal. Mediastinal contours appear intact. There is no evidence of pleural effusion or pneumothorax. Worsened aeration of the lungs with increased interstitial thickening, superimposed to previously demonstrated interstitial lung disease. Osseous structures are without acute abnormality. Soft tissues are grossly normal. IMPRESSION: Increased interstitial thickening, superimposed to previously demonstrated cystic interstitial lung disease. No evidence of pneumothorax. Electronically Signed   By: Fidela Salisbury M.D.   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  He understands that his CML is not done well, he has lost a lot of weight, he is significantly malnourished, and his COPD is maximally treated and in a severe, end-stage state.  He also has ongoing significant heart problems, and I think he has a realistic understanding of what this means in terms of a life expectancy status.  Meds ordered this encounter  Medications  . LORazepam (ATIVAN) 0.5 MG tablet    Sig: Take 1 tablet (0.5 mg total) by mouth every 6 (six) hours as needed for anxiety.    Dispense:  30 tablet    Refill:  1   Orders Placed This Encounter  Procedures  . Flu vaccine HIGH DOSE PF (Fluzone High dose)    Signed,  Gautham Hewins T. Wahneta Derocher, MD   Allergies as of 04/06/2018   No Known Allergies     Medication List        Accurate as  of 04/06/18  2:25 PM. Always use your most recent med list.  acetaminophen 500 MG tablet Commonly known as:  TYLENOL Take 1,000 mg by mouth daily as needed for moderate pain or headache.   albuterol (2.5 MG/3ML) 0.083% nebulizer solution Commonly known as:  PROVENTIL Take 3 mLs (2.5 mg total) by nebulization every 4 (four) hours as needed for wheezing or shortness of breath.   ascorbic acid 250 MG tablet Commonly known as:  VITAMIN C Take 1 tablet (250 mg total) by mouth 2 (two) times daily.   budesonide 0.5 MG/2ML nebulizer solution Commonly known as:  PULMICORT Take 2 mLs (0.5 mg total) by nebulization 2 (two) times daily.   carvedilol 6.25 MG tablet Commonly known as:  COREG Take 12.5 mg by mouth 2 (two) times daily with a meal.   formoterol 20 MCG/2ML nebulizer solution Commonly known as:  PERFOROMIST Take 2 mLs (20 mcg total) by nebulization 2 (two) times daily.   furosemide 20 MG tablet Commonly known as:  LASIX Take 1 tablet (20 mg total) by mouth daily.   gabapentin 300 MG capsule Commonly known as:  NEURONTIN Take 1 capsule (300 mg total) by mouth at bedtime.   ipratropium-albuterol 0.5-2.5 (3) MG/3ML Soln Commonly known as:  DUONEB Take 3 mLs by nebulization every 4 (four) hours as needed.   LORazepam 0.5 MG tablet Commonly known as:  ATIVAN Take 1 tablet (0.5 mg total) by mouth every 6 (six) hours as needed for anxiety.   megestrol 40 MG tablet Commonly known as:  MEGACE Take 1 tablet (40 mg total) by mouth daily.   multivitamin with minerals Tabs tablet Take 1 tablet by mouth daily.   nystatin 100000 UNIT/ML suspension Commonly known as:  MYCOSTATIN Use as directed 5 mLs (500,000 Units total) in the mouth or throat 4 (four) times daily.   omeprazole 20 MG capsule Commonly known as:  PRILOSEC Take 20 mg by mouth daily.   oxymetazoline 0.05 % nasal spray Commonly known as:  AFRIN Place 1 spray into both nostrils daily as needed for  congestion.   protein supplement Powd Take 12-18 g by mouth 3 (three) times daily with meals.   pseudoephedrine 30 MG tablet Commonly known as:  SUDAFED Take 30 mg by mouth daily as needed for congestion.   tiZANidine 4 MG tablet Commonly known as:  ZANAFLEX Take 1 tablet (4 mg total) by mouth Nightly.   Vitamin D 2000 units Caps Take 2,000 Units by mouth every evening.

## 2018-04-06 ENCOUNTER — Encounter: Payer: Self-pay | Admitting: Family Medicine

## 2018-04-06 ENCOUNTER — Ambulatory Visit (INDEPENDENT_AMBULATORY_CARE_PROVIDER_SITE_OTHER): Payer: Medicare HMO | Admitting: Family Medicine

## 2018-04-06 VITALS — BP 110/60 | HR 103 | Temp 97.7°F | Ht 71.0 in | Wt 145.5 lb

## 2018-04-06 DIAGNOSIS — C9310 Chronic myelomonocytic leukemia not having achieved remission: Secondary | ICD-10-CM

## 2018-04-06 DIAGNOSIS — Z23 Encounter for immunization: Secondary | ICD-10-CM | POA: Diagnosis not present

## 2018-04-06 DIAGNOSIS — E43 Unspecified severe protein-calorie malnutrition: Secondary | ICD-10-CM

## 2018-04-06 DIAGNOSIS — J449 Chronic obstructive pulmonary disease, unspecified: Secondary | ICD-10-CM | POA: Diagnosis not present

## 2018-04-06 DIAGNOSIS — J9611 Chronic respiratory failure with hypoxia: Secondary | ICD-10-CM

## 2018-04-06 MED ORDER — LORAZEPAM 0.5 MG PO TABS: 0.5000 mg | ORAL_TABLET | Freq: Four times a day (QID) | ORAL | 1 refills | Status: DC | PRN

## 2018-04-07 ENCOUNTER — Telehealth: Payer: Self-pay

## 2018-04-07 NOTE — Telephone Encounter (Signed)
Nutrition  Called patient for nutrition follow-up and left message on voicemail asking patient to return call.  Krystle Polcyn B. Zenia Resides, [place], [place] Registered Dietitian [phone] (pager)

## 2018-04-08 ENCOUNTER — Telehealth: Payer: Self-pay | Admitting: Internal Medicine

## 2018-04-08 MED ORDER — PREDNISONE 20 MG PO TABS: 40.0000 mg | ORAL_TABLET | Freq: Every day | ORAL | 0 refills | Status: DC

## 2018-04-08 MED ORDER — AZITHROMYCIN 250 MG PO TABS: ORAL_TABLET | ORAL | 0 refills | Status: AC

## 2018-04-08 NOTE — Telephone Encounter (Signed)
Called patient's spouse and made aware of plan for Prednisone 40 mg x 7days and zpak. She was informed that if patient does not improve he will need further evaluation. Nothing further needed.

## 2018-04-08 NOTE — Telephone Encounter (Signed)
Pt on 3 liters 02. He had to cut up to 4 Liters last night. Pt is using nebs Albuterol, Budesonide and Duoneb. He also took a Ativan. Pt completed Prednisone taper and is very sob. Please advise?

## 2018-04-08 NOTE — Telephone Encounter (Signed)
Pt wife stopped the prednisone 2 days ago, and is still having difficulty breathing. Please call.

## 2018-04-08 NOTE — Telephone Encounter (Signed)
Prednisone 40 mg daily for 7 days z pak  If patient symptoms worsen needs to go to ER

## 2018-04-10 LAB — ECHOCARDIOGRAM COMPLETE
HEIGHTINCHES: 71 in
Weight: 2208 oz

## 2018-04-14 ENCOUNTER — Ambulatory Visit: Payer: Medicare HMO

## 2018-04-24 ENCOUNTER — Emergency Department
Admission: EM | Admit: 2018-04-24 | Discharge: 2018-04-24 | Disposition: A | Discharge status: Home / Self Care | Payer: Medicare HMO | Attending: Emergency Medicine | Admitting: Emergency Medicine

## 2018-04-24 ENCOUNTER — Other Ambulatory Visit: Payer: Self-pay

## 2018-04-24 ENCOUNTER — Encounter: Payer: Self-pay | Admitting: Emergency Medicine

## 2018-04-24 ENCOUNTER — Emergency Department: Payer: Medicare HMO

## 2018-04-24 DIAGNOSIS — I110 Hypertensive heart disease with heart failure: Secondary | ICD-10-CM | POA: Diagnosis not present

## 2018-04-24 DIAGNOSIS — I5020 Unspecified systolic (congestive) heart failure: Secondary | ICD-10-CM | POA: Insufficient documentation

## 2018-04-24 DIAGNOSIS — J441 Chronic obstructive pulmonary disease with (acute) exacerbation: Secondary | ICD-10-CM | POA: Diagnosis not present

## 2018-04-24 DIAGNOSIS — E119 Type 2 diabetes mellitus without complications: Secondary | ICD-10-CM | POA: Insufficient documentation

## 2018-04-24 DIAGNOSIS — I2510 Atherosclerotic heart disease of native coronary artery without angina pectoris: Secondary | ICD-10-CM | POA: Insufficient documentation

## 2018-04-24 DIAGNOSIS — R0602 Shortness of breath: Secondary | ICD-10-CM | POA: Diagnosis present

## 2018-04-24 DIAGNOSIS — Z79899 Other long term (current) drug therapy: Secondary | ICD-10-CM | POA: Diagnosis not present

## 2018-04-24 LAB — CBC WITH DIFFERENTIAL/PLATELET
Basophils Absolute: 0.1 10*3/uL (ref 0–0.1)
Basophils Relative: 1 %
EOS PCT: 0 %
Eosinophils Absolute: 0 10*3/uL (ref 0–0.7)
HEMATOCRIT: 35 % — AB (ref 40.0–52.0)
Hemoglobin: 12.4 g/dL — ABNORMAL LOW (ref 13.0–18.0)
LYMPHS ABS: 1.4 10*3/uL (ref 1.0–3.6)
Lymphocytes Relative: 16 %
MCH: 34.2 pg — ABNORMAL HIGH (ref 26.0–34.0)
MCHC: 35.4 g/dL (ref 32.0–36.0)
MCV: 96.4 fL (ref 80.0–100.0)
MONO ABS: 3.6 10*3/uL — AB (ref 0.2–1.0)
MONOS PCT: 41 %
NEUTROS ABS: 3.7 10*3/uL (ref 1.4–6.5)
Neutrophils Relative %: 42 %
PLATELETS: 217 10*3/uL (ref 150–440)
RBC: 3.64 MIL/uL — ABNORMAL LOW (ref 4.40–5.90)
RDW: 16.7 % — AB (ref 11.5–14.5)
WBC: 8.8 10*3/uL (ref 3.8–10.6)

## 2018-04-24 LAB — BASIC METABOLIC PANEL
Anion gap: 10 (ref 5–15)
BUN: 10 mg/dL (ref 8–23)
CALCIUM: 8.9 mg/dL (ref 8.9–10.3)
CO2: 30 mmol/L (ref 22–32)
CREATININE: 0.73 mg/dL (ref 0.61–1.24)
Chloride: 95 mmol/L — ABNORMAL LOW (ref 98–111)
GFR calc Af Amer: >60 mL/min (ref >60)
GFR calc non Af Amer: >60 mL/min (ref >60)
GLUCOSE: 127 mg/dL — AB (ref 70–99)
Potassium: 3.7 mmol/L (ref 3.5–5.1)
Sodium: 135 mmol/L (ref 135–145)

## 2018-04-24 LAB — TROPONIN I: Troponin I: 0.03 ng/mL (ref <0.03)

## 2018-04-24 MED ORDER — IPRATROPIUM-ALBUTEROL 0.5-2.5 (3) MG/3ML IN SOLN
3.0000 mL | Freq: Once | RESPIRATORY_TRACT | Status: AC
  Administered 2018-04-24: 3 mL via RESPIRATORY_TRACT
  Filled 2018-04-24: qty 3

## 2018-04-24 MED ORDER — PREDNISONE 10 MG (21) PO TBPK: ORAL_TABLET | ORAL | 0 refills | Status: DC

## 2018-04-24 MED ORDER — METHYLPREDNISOLONE SODIUM SUCC 125 MG IJ SOLR
125.0000 mg | Freq: Once | INTRAMUSCULAR | Status: AC
  Administered 2018-04-24: 125 mg via INTRAVENOUS
  Filled 2018-04-24: qty 2

## 2018-04-24 NOTE — Discharge Instructions (Addendum)
Please seek medical attention for any high fevers, chest pain, shortness of breath, change in behavior, persistent vomiting, bloody stool or any other new or concerning symptoms.  

## 2018-04-24 NOTE — ED Triage Notes (Signed)
First Nurse Note:  C/O SOB x 2 months.

## 2018-04-24 NOTE — ED Triage Notes (Signed)
Pt to ED via POV c/o shortness of breath x 2 month, worse over the past 2-3 days. Pt wears O2 at home, is supposed to be on 3 liter via [place] but 2 days he upped it to 4 liters and today his wife turned him up to 5 liters. Pt states he has had non productive cough and nasal congestion. Pt is in NAD at this time.

## 2018-04-24 NOTE — ED Provider Notes (Signed)
[facility] Emergency Department Provider Note  ____________________________________________   I have reviewed the triage vital signs and the nursing notes.   HISTORY  Chief Complaint Shortness of Breath   History limited by: Not Limited   HPI Tony Watson is a 67 y.o. male who presents to the emergency department today because of concerns for shortness of breath.  States he has a history of chronic lung disease.  For the past 2 days he feels like his breathing is gotten worse.  This is associated with some tightness across his chest.  Patient has been trying his home breathing treatments without any significant relief.  Family states that he has been hospitalized multiple times recently for this.  Typically when he comes off of the prednisone his breathing gets worse again.  He denies any known sick contacts.  Per medical record review patient has a history of CAD, COPD  Past Medical History:  Diagnosis Date  . Arthritis   . CAD (coronary artery disease)    inferior MI 11/99 (in [place]) with PCI to [facility]. last [place] 2004 [facility]): EF 50%, patent RCA stent. no obstructive disease. last myoview 2008: EF 42%, inferior infarct, no ischemia.   . Cancer ([place])    CMML [place]  . CHF (congestive heart failure) ([place])   . COPD (chronic obstructive pulmonary disease) ([place])   . Diabetic autonomic neuropathy associated with type 2 diabetes mellitus ([place]) 03/27/2015  . Diverticulosis 2002  . DM2 (diabetes mellitus, type 2) ([facility])    A1c 6.0% 12/11  . GERD (gastroesophageal reflux disease)   . HTN (hypertension)   . Hyperlipidemia   . Ischemic cardiomyopathy    mild with EF 42% on myoviews 2008.  . Medical non-compliance   . Nicotine dependence    chronic, active  . Smoker   . Spinal stenosis   . 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 TIA (transient ischemic attack)    hx; now s/p PFO closure 2004 (in [place])  . Ulcer     Patient Active Problem List   Diagnosis Date Noted  . Ischemic cardiomyopathy 03/24/2018  . Respiratory failure with hypoxia ([place]) 03/03/2018  . Protein-calorie malnutrition, severe 03/03/2018  . 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 Diabetic autonomic neuropathy associated with type 2 diabetes mellitus ([place]) 03/27/2015  . History of inferior MI (myocardial infarction) 10/14/2013  . CAD (coronary artery disease)   . Hypertensive heart disease   . History of noncompliance with medical treatment 05/30/2012  . 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 Severe chronic obstructive pulmonary disease ([place]) 03/23/2011  . Other emphysema ([place]) 03/23/2011  . ALLERGIC RHINITIS CAUSE UNSPECIFIED 07/31/2010  . Type 2 diabetes mellitus with vascular disease ([place]) 01/02/2010  . TOBACCO USE 04/17/2009  . TRANSIENT ISCHEMIC ATTACKS, HX OF 04/17/2009  . Hyperlipidemia 12/19/2008    Past Surgical History:  Procedure Laterality Date  . BACK SURGERY     patient denies-just lumbar punctures  . BRONCHOSCOPY    . CARDIAC CATHETERIZATION  11/99   CI per [facility]  . CATARACT EXTRACTION W/PHACO Right 11/24/2016   Procedure: CATARACT EXTRACTION PHACO AND INTRAOCULAR LENS PLACEMENT (IOC);  Surgeon: Birder Robson, MD;  Location: [facility] ORS;  Service: Ophthalmology;  Laterality: Right;  [place] 1:04.3 AP% 22.3 CDE 14.35 Fluid pack lot # [id number]  . CATARACT EXTRACTION W/PHACO Left 12/29/2016   Procedure: CATARACT EXTRACTION PHACO AND INTRAOCULAR LENS PLACEMENT (IOC);  Surgeon:  Birder Robson, MD;  Location: [facility] ORS;  Service: Ophthalmology;  Laterality: Left;  [place] 00:52 AP% 18.5 CDE 9.67 Fluid pack lot # [id number]  . COLONOSCOPY    . COLONOSCOPY WITH PROPOFOL N/A 05/25/2017   Procedure: COLONOSCOPY WITH PROPOFOL;  Surgeon: Jonathon Bellows, MD;  Location[facility] ENDOSCOPY;  Service: Gastroenterology;  Laterality: N/A;  . CORONARY ANGIOPLASTY     STENT  . CORONARY ARTERY  BYPASS GRAFT     stent  . ESOPHAGOGASTRODUODENOSCOPY (EGD) WITH PROPOFOL N/A 05/25/2017   Procedure: ESOPHAGOGASTRODUODENOSCOPY (EGD) WITH PROPOFOL;  Surgeon: Jonathon Bellows, MD;  Location: [facility] ENDOSCOPY;  Service: Gastroenterology;  Laterality: N/A;  . EYE SURGERY    . FLEXIBLE BRONCHOSCOPY N/A 05/19/2017   Procedure: FLEXIBLE BRONCHOSCOPY;  Surgeon: Wilhelmina Mcardle, MD;  Location: [facility] ORS;  Service: Pulmonary;  Laterality: N/A;  . open heart surgery  2004   PFO repair  . UPPER GI ENDOSCOPY      Prior to Admission medications   Medication Sig Start Date End Date Taking? Authorizing Provider  acetaminophen (TYLENOL) 500 MG tablet Take 1,000 mg by mouth daily as needed for moderate pain or headache.    [provider]  albuterol (PROVENTIL) (2.5 MG/3ML) 0.083% nebulizer solution Take 3 mLs (2.5 mg total) by nebulization every 4 (four) hours as needed for wheezing or shortness of breath. 03/06/18   Vaughan Basta, MD  budesonide (PULMICORT) 0.5 MG/2ML nebulizer solution Take 2 mLs (0.5 mg total) by nebulization 2 (two) times daily. 03/06/18 03/06/19  Vaughan Basta, MD  carvedilol (COREG) 6.25 MG tablet Take 12.5 mg by mouth 2 (two) times daily with a meal.    [provider]  Cholecalciferol (VITAMIN D) 2000 units CAPS Take 2,000 Units by mouth every evening.    [provider]  formoterol (PERFOROMIST) 20 MCG/2ML nebulizer solution Take 2 mLs (20 mcg total) by nebulization 2 (two) times daily. 02/24/18   [name], MD  furosemide (LASIX) 20 MG tablet Take 1 tablet (20 mg total) by mouth daily. 03/28/18   Epifanio Lesches, MD  gabapentin (NEURONTIN) 300 MG capsule Take 1 capsule (300 mg total) by mouth at bedtime. 03/23/18   Copland, Frederico Hamman, MD  ipratropium-albuterol (DUONEB) 0.5-2.5 (3) MG/3ML SOLN Take 3 mLs by nebulization every 4 (four) hours as needed. 03/11/18   [provider]  LORazepam (ATIVAN) 0.5 MG tablet Take 1 tablet (0.5 mg  total) by mouth every 6 (six) hours as needed for anxiety. 04/06/18   Copland, Frederico Hamman, MD  megestrol (MEGACE) 40 MG tablet Take 1 tablet (40 mg total) by mouth daily. 04/26/17   Lloyd Huger, MD  Multiple Vitamin (MULTIVITAMIN WITH MINERALS) TABS tablet Take 1 tablet by mouth daily. 03/07/18   Vaughan Basta, MD  nystatin (MYCOSTATIN) 100000 UNIT/ML suspension Use as directed 5 mLs (500,000 Units total) in the mouth or throat 4 (four) times daily. 03/23/18   Copland, Frederico Hamman, MD  omeprazole (PRILOSEC) 20 MG capsule Take 20 mg by mouth daily.    [provider]  oxymetazoline (AFRIN) 0.05 % nasal spray Place 1 spray into both nostrils daily as needed for congestion.    [provider]  predniSONE (DELTASONE) 20 MG tablet Take 2 tablets (40 mg total) by mouth daily. 04/08/18 04/08/19  [name], MD  predniSONE (STERAPRED UNI-PAK 21 TAB) 10 MG (21) TBPK tablet Per packaging instructions 04/24/18   Nance Pear, MD  protein supplement (RESOURCE BENEPROTEIN) POWD Take 12-18 g by mouth 3 (three) times daily with meals. 03/06/18  Vaughan Basta, MD  pseudoephedrine (SUDAFED) 30 MG tablet Take 30 mg by mouth daily as needed for congestion.     [provider]  tiZANidine (ZANAFLEX) 4 MG tablet Take 1 tablet (4 mg total) by mouth Nightly. 03/23/18   Copland, Frederico Hamman, MD  vitamin C (VITAMIN C) 250 MG tablet Take 1 tablet (250 mg total) by mouth 2 (two) times daily. 03/06/18   Vaughan Basta, MD    Allergies Patient has no known allergies.  Family History  Problem Relation Age of Onset  . Coronary artery disease Unknown        family hx  . Breast cancer Unknown        1st egree relative <50  . Cancer Mother   . Heart disease Father     Social History Social History   Tobacco Use  . Smoking status: Former Smoker    Packs/day: 0.50    Years: 45.00    Pack years: 22.50    Types: Cigarettes    Last attempt to quit: 01/25/2018    Years  since quitting: 0.2  . Smokeless tobacco: Never Used  . Tobacco comment: 1 ppd +40 years  Substance Use Topics  . Alcohol use: Not Currently    Alcohol/week: 0.0 standard drinks    Comment: weekly but last dose 1 month  . Drug use: No    Review of Systems Constitutional: No fever/chills Eyes: No visual changes. ENT: No sore throat. Cardiovascular: Denies chest pain. Respiratory: Positive for shortness of breath. Gastrointestinal: No abdominal pain.  No nausea, no vomiting.  No diarrhea.   Genitourinary: Negative for dysuria. Musculoskeletal: Negative for back pain. Skin: Negative for rash. Neurological: Negative for headaches, focal weakness or numbness.  ____________________________________________   PHYSICAL EXAM:  VITAL SIGNS: ED Triage Vitals  Enc Vitals Group     BP 04/24/18 1450 112/67     Pulse Rate 04/24/18 1450 99     Resp 04/24/18 1450 16     Temp 04/24/18 1450 97.7 F (36.5 C)     Temp Source 04/24/18 1450 Oral     SpO2 04/24/18 1450 94 %     Weight 04/24/18 1451 145 lb 8 oz (66 kg)     Height --      Head Circumference --      Peak Flow --      Pain Score 04/24/18 1450 4   Constitutional: Alert and oriented.  Eyes: Conjunctivae are normal.  ENT      Head: Normocephalic and atraumatic.      Nose: No congestion/rhinnorhea.      Mouth/Throat: Mucous membranes are moist.      Neck: No stridor. Hematological/Lymphatic/Immunilogical: No cervical lymphadenopathy. Cardiovascular: Normal rate, regular rhythm.  No murmurs, rubs, or gallops.  Respiratory: Diminished breath sounds diffusely, some expiratory wheezing Gastrointestinal: Soft and non tender. No rebound. No guarding.  Genitourinary: Deferred Musculoskeletal: Normal range of motion in all extremities. No lower extremity edema. Neurologic:  Normal speech and language. No gross focal neurologic deficits are appreciated.  Skin:  Skin is warm, dry and intact. No rash noted. Psychiatric: Mood and affect  are normal. Speech and behavior are normal. Patient exhibits appropriate insight and judgment.  ____________________________________________    LABS (pertinent positives/negatives)  BMP wnl except cl 95, glu 127 CBC wbc 8.8, hgb 12.4, plt 217 Trop 0.03 ____________________________________________   EKG  I, Nance Pear, attending physician, personally viewed and interpreted this EKG  EKG Time: 1458 Rate: 101 Rhythm: sinus tachycardia Axis:  right axis deviation Intervals: qtc 510 QRS: RBBB, LPFB ST changes: no st elevation Impression: abnormal ekg   ____________________________________________    RADIOLOGY  CXR Stable interstitial disease  ____________________________________________   PROCEDURES  Procedures  ____________________________________________   INITIAL IMPRESSION / ASSESSMENT AND PLAN / ED COURSE  Pertinent labs & imaging results that were available during my care of the patient were reviewed by me and considered in my medical decision making (see chart for details).   Patient presented to the emergency department today because of concerns for shortness of breath.  Patient does have a history of chronic lung disease.  Patient's lungs did have some wheezing.  This point I think COPD likely.  Troponin very minimally elevated at 0.03.  I doubt that this represents ACS.  Patient's symptoms started a couple of days ago to expect higher elevation if it was truly ACS.  Patient did feel better after steroids and breathing treatment.  At this point I think COPD likely.  Patient felt comfortable going home at this time.  Will plan on discharging with further steroids.   ____________________________________________   FINAL CLINICAL IMPRESSION(S) / ED DIAGNOSES  Final diagnoses:  COPD exacerbation ([place])     Note: This dictation was prepared with Dragon dictation. Any transcriptional errors that result from this process are unintentional     Nance Pear, MD 04/24/18 [phone]

## 2018-04-26 ENCOUNTER — Other Ambulatory Visit: Payer: Medicare HMO

## 2018-04-26 ENCOUNTER — Ambulatory Visit: Payer: Medicare HMO | Admitting: Oncology

## 2018-04-27 ENCOUNTER — Ambulatory Visit (INDEPENDENT_AMBULATORY_CARE_PROVIDER_SITE_OTHER): Payer: Medicare HMO | Admitting: Family Medicine

## 2018-04-27 ENCOUNTER — Encounter: Payer: Self-pay | Admitting: Family Medicine

## 2018-04-27 VITALS — BP 120/80 | HR 116 | Temp 97.5°F | Ht 71.0 in | Wt 150.8 lb

## 2018-04-27 DIAGNOSIS — E43 Unspecified severe protein-calorie malnutrition: Secondary | ICD-10-CM

## 2018-04-27 DIAGNOSIS — Z7189 Other specified counseling: Secondary | ICD-10-CM | POA: Diagnosis not present

## 2018-04-27 DIAGNOSIS — J9611 Chronic respiratory failure with hypoxia: Secondary | ICD-10-CM

## 2018-04-27 DIAGNOSIS — J449 Chronic obstructive pulmonary disease, unspecified: Secondary | ICD-10-CM

## 2018-04-27 DIAGNOSIS — C9310 Chronic myelomonocytic leukemia not having achieved remission: Secondary | ICD-10-CM

## 2018-04-27 MED ORDER — FUROSEMIDE 20 MG PO TABS: 20.0000 mg | ORAL_TABLET | Freq: Every day | ORAL | 2 refills | Status: DC

## 2018-04-27 NOTE — Progress Notes (Signed)
Dr. Frederico Hamman T. Aashir Umholtz, MD, [facility] [place], [address] Phone: [phone] Fax: [phone]  04/27/2018  Patient: Tony Watson, Tony Watson, Tony Watson, 67 y.o.  Primary Physician:  Owens Loffler, MD   Chief Complaint  Patient presents with  . Follow-up    COPD exacerbation   Subjective:   Tony Watson is a 67 y.o. very pleasant male patient who presents with the following:  67 year old male with severe COPD f/u from hosp again.   9/15 ER visit for COPD.  He is globally not doing well.  He has lost approximately 80 pounds.  He is in chronic respiratory failure on chronic oxygen now.  He is doing nebulizer treatments throughout much of the day, and at this point he basically cannot leave his home.  He went to the hospital again, and was given more prednisone, and he is here in follow-up for further potential advice.  No warning, then doing poorly.   Dr. Mortimer Fries is pulmonary. Prednisone indefinitely or as needed? Right now does not leave the home.   Lung valve?   Golden rod paper.  Past Medical History, Surgical History, Social History, Family History, Problem List, Medications, and Allergies have been reviewed and updated if relevant.  Patient Active Problem List   Diagnosis Date Noted  . Respiratory failure with hypoxia ([place]) 03/03/2018    Priority: High  . Protein-calorie malnutrition, severe 03/03/2018    Priority: High  . 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 Severe chronic obstructive pulmonary disease ([place]) 03/23/2011    Priority: High  . Type 2 diabetes mellitus with vascular disease ([place]) 01/02/2010    Priority: High  . Ischemic cardiomyopathy 03/24/2018  . Diabetic autonomic neuropathy associated with type 2 diabetes mellitus ([place]) 03/27/2015  . History of inferior MI (myocardial infarction) 10/14/2013  . CAD  (coronary artery disease)   . Hypertensive heart disease   . History of noncompliance with medical treatment 05/30/2012  . Systolic heart failure secondary to coronary artery disease ([place]) 05/30/2012  . Other emphysema ([place]) 03/23/2011  . ALLERGIC RHINITIS CAUSE UNSPECIFIED 07/31/2010  . TOBACCO USE 04/17/2009  . TRANSIENT ISCHEMIC ATTACKS, HX OF 04/17/2009  . Hyperlipidemia 12/19/2008    Past Medical History:  Diagnosis Date  . Arthritis   . CAD (coronary artery disease)    inferior MI 11/99 (in [place]) with PCI to [facility]. last [place] 2004 [facility]): EF 50%, patent RCA stent. no obstructive disease. last myoview 2008: EF 42%, inferior infarct, no ischemia.   . Cancer ([place])    CMML [place]  . CHF (congestive heart failure) ([place])   . COPD (chronic obstructive pulmonary disease) ([place])   . Diabetic autonomic neuropathy associated with type 2 diabetes mellitus ([place]) 03/27/2015  . Diverticulosis 2002  . DM2 (diabetes mellitus, type 2) (H[facility])    A1c 6.0% 12/11  . GERD (gastroesophageal reflux disease)   . HTN (hypertension)   . Hyperlipidemia   . Ischemic cardiomyopathy    mild with EF 42% on myoviews 2008.  . Medical non-compliance   . Nicotine dependence    chronic, active  . Smoker   . Spinal stenosis   . 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 TIA (transient ischemic attack)    hx; now s/p PFO closure 2004 (in [place])  . Ulcer     Past Surgical History:  Procedure Laterality  Date  . BACK SURGERY     patient denies-just lumbar punctures  . BRONCHOSCOPY    . CARDIAC CATHETERIZATION  11/99   CI per [facility]  . CATARACT EXTRACTION W/PHACO Right 11/24/2016   Procedure: CATARACT EXTRACTION PHACO AND INTRAOCULAR LENS PLACEMENT (IOC);  Surgeon: Birder Robson, MD;  Location: [facility] ORS;  Service: Ophthalmology;  Laterality: Right;  [place] 1:04.3 AP% 22.3 CDE 14.35 Fluid pack lot # [id number]  . CATARACT EXTRACTION W/PHACO Left 12/29/2016   Procedure: CATARACT EXTRACTION PHACO AND INTRAOCULAR  LENS PLACEMENT (IOC);  Surgeon: Birder Robson, MD;  Location: [facility] ORS;  Service: Ophthalmology;  Laterality: Left;  [place] 00:52 AP% 18.5 CDE 9.67 Fluid pack lot # [id number]  . COLONOSCOPY    . COLONOSCOPY WITH PROPOFOL N/A 05/25/2017   Procedure: COLONOSCOPY WITH PROPOFOL;  Surgeon: Jonathon Bellows, MD;  Location[facility] ENDOSCOPY;  Service: Gastroenterology;  Laterality: N/A;  . CORONARY ANGIOPLASTY     STENT  . CORONARY ARTERY BYPASS GRAFT     stent  . ESOPHAGOGASTRODUODENOSCOPY (EGD) WITH PROPOFOL N/A 05/25/2017   Procedure: ESOPHAGOGASTRODUODENOSCOPY (EGD) WITH PROPOFOL;  Surgeon: Jonathon Bellows, MD;  Location: [facility] ENDOSCOPY;  Service: Gastroenterology;  Laterality: N/A;  . EYE SURGERY    . FLEXIBLE BRONCHOSCOPY N/A 05/19/2017   Procedure: FLEXIBLE BRONCHOSCOPY;  Surgeon: Wilhelmina Mcardle, MD;  Location: [facility] ORS;  Service: Pulmonary;  Laterality: N/A;  . open heart surgery  2004   PFO repair  . UPPER GI ENDOSCOPY      Social History   Socioeconomic History  . Marital status: Married    Spouse name: Not on file  . Number of children: Not on file  . Years of education: Not on file  . Highest education level: Not on file  Occupational History  . Not on file  Social Needs  . Financial resource strain: Not on file  . Food insecurity:    Worry: Not on file    Inability: Not on file  . Transportation needs:    Medical: Not on file    Non-medical: Not on file  Tobacco Use  . Smoking status: Former Smoker    Packs/day: 0.50    Years: 45.00    Pack years: 22.50    Types: Cigarettes    Last attempt to quit: 01/25/2018    Years since quitting: 0.2  . Smokeless tobacco: Never Used  . Tobacco comment: 1 ppd +40 years  Substance and Sexual Activity  . Alcohol use: Not Currently    Alcohol/week: 0.0 standard drinks    Comment: weekly but last dose 1 month  . Drug use: No  . Sexual activity: Not on file  Lifestyle  . Physical activity:    Days per week: Not on file    Minutes  per session: Not on file  . Stress: Not on file  Relationships  . Social connections:    Talks on phone: Not on file    Gets together: Not on file    Attends religious service: Not on file    Active member of club or organization: Not on file    Attends meetings of clubs or organizations: Not on file    Relationship status: Not on file  . Intimate partner violence:    Fear of current or ex partner: Not on file    Emotionally abused: Not on file    Physically abused: Not on file    Forced sexual activity: Not on file  Other Topics Concern  . Not on file  Social History Narrative   Married, 1 daughter.  Lives in [place], [place]    Family History  Problem Relation Age of Onset  . Coronary artery disease Unknown        family hx  . Breast cancer Unknown        1st egree relative <50  . Cancer Mother   . Heart disease Father     No Known Allergies  Medication list reviewed and updated in full in [place].   GEN: No acute illnesses, no fevers, chills. GI: No n/v/d, eating normally Pulm: No SOB Interactive and getting along well at home.  Otherwise, ROS is as per the HPI.  Objective:   BP 120/80   Pulse (!) 116   Temp (!) 97.5 F (36.4 C) (Oral)   Ht _0  (1.803 m)   Wt 150 lb 12 oz (68.4 kg)   SpO2 (!) 88% Comment: 3 L O2  BMI 21.03 kg/m   GEN: cachectic. HEENT: Atraumatic, Normocephalic. Neck supple. No masses, No LAD. Ears and Nose: No external deformity. CV: RRR, No M/G/R. No JVD. No thrill. No extra heart sounds. PULM: No crackles, decreased BS b. No retractions. No resp. distress. No accessory muscle use. EXTR: No c/c/e NEURO Normal gait.  PSYCH: Normally interactive. Conversant. Not depressed or anxious appearing.  Calm demeanor.   Laboratory and Imaging Data: Results for orders placed or performed during the hospital encounter of 04/24/18  CBC with Differential  Result Value Ref Range   WBC 8.8 3.8 - 10.6 K/uL   RBC 3.64 (L) 4.40 - 5.90  MIL/uL   Hemoglobin 12.4 (L) 13.0 - 18.0 g/dL   HCT 35.0 (L) 40.0 - 52.0 %   MCV 96.4 80.0 - 100.0 fL   MCH 34.2 (H) 26.0 - 34.0 pg   MCHC 35.4 32.0 - 36.0 g/dL   RDW 16.7 (H) 11.5 - 14.5 %   Platelets 217 150 - 440 K/uL   Neutrophils Relative % 42 %   Lymphocytes Relative 16 %   Monocytes Relative 41 %   Eosinophils Relative 0 %   Basophils Relative 1 %   Neutro Abs 3.7 1.4 - 6.5 K/uL   Lymphs Abs 1.4 1.0 - 3.6 K/uL   Monocytes Absolute 3.6 (H) 0.2 - 1.0 K/uL   Eosinophils Absolute 0.0 0 - 0.7 K/uL   Basophils Absolute 0.1 0 - 0.1 K/uL   Smear Review MORPHOLOGY UNREMARKABLE   Basic metabolic panel  Result Value Ref Range   Sodium 135 135 - 145 mmol/L   Potassium 3.7 3.5 - 5.1 mmol/L   Chloride 95 (L) 98 - 111 mmol/L   CO2 30 22 - 32 mmol/L   Glucose, Bld 127 (H) 70 - 99 mg/dL   BUN 10 8 - 23 mg/dL   Creatinine, Ser 0.73 0.61 - 1.24 mg/dL   Calcium 8.9 8.9 - 10.3 mg/dL   GFR calc non Af Amer >60 >60 mL/min   GFR calc Af Amer >60 >60 mL/min   Anion gap 10 5 - 15  Troponin I  Result Value Ref Range   Troponin I 0.03 (HH) <0.03 ng/mL     Assessment and Plan:   Severe chronic obstructive pulmonary disease (HCC)  Advanced directives, counseling/discussion  Protein-calorie malnutrition, severe  Chronic respiratory failure with hypoxia (HCC)  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  He has been doing poorly over the last few months whenever he is not taking prednisone.  I am going to contact pulmonology to see if there is anything additional that they could possibly think of or potentially increasing his prednisone use.  Patient and his wife mentioned a some type of pulmonic valve that they saw on the  Internet, and I really have nothing to add about that.  We spent a majority of the office visit discussing his advanced directives and goals of end-of-life care.  I offered them a palliative care consult, but both the patient and his wife declined.  They were very clear that they did not want to have resuscitative attempts if the patient's heart should stop.  They did not want CPR.  Additionally, but they do not want the patient to be intubated.  They do want to continue receiving medical care, treatments for his underlying condition, antibiotics, prednisone, and overall medical care up until this point.  He was of sound mind when we had this discussion.  Follow-up: with Pulm asap  Meds ordered this encounter  Medications  . furosemide (LASIX) 20 MG tablet    Sig: Take 1 tablet (20 mg total) by mouth daily.    Dispense:  30 tablet    Refill:  2   Signed,  Brydan Downard T. Camylle Whicker, MD   Allergies as of 04/27/2018   No Known Allergies     Medication List        Accurate as of 04/27/18 11:59 PM. Always use your most recent med list.          acetaminophen 500 MG tablet Commonly known as:  TYLENOL Take 1,000 mg by mouth daily as needed for moderate pain or headache.   albuterol (2.5 MG/3ML) 0.083% nebulizer solution Commonly known as:  PROVENTIL Take 3 mLs (2.5 mg total) by nebulization every 4 (four) hours as needed for wheezing or shortness of breath.   ascorbic acid 250 MG tablet Commonly known as:  VITAMIN C Take 1 tablet (250 mg total) by mouth 2 (two) times daily.   budesonide 0.5 MG/2ML nebulizer solution Commonly known as:  PULMICORT Take 2 mLs (0.5 mg total) by nebulization 2 (two) times daily.   carvedilol 6.25 MG tablet Commonly known as:  COREG Take 12.5 mg by mouth 2 (two) times daily with a meal.   formoterol 20 MCG/2ML nebulizer solution Commonly known as:  PERFOROMIST Take 2 mLs (20 mcg total) by nebulization 2 (two) times daily.   furosemide 20 MG  tablet Commonly known as:  LASIX Take 1 tablet (20 mg total) by mouth daily.   gabapentin 300 MG capsule Commonly known as:  NEURONTIN Take 1 capsule (300 mg total) by mouth at bedtime.   ipratropium-albuterol 0.5-2.5 (3) MG/3ML Soln Commonly known as:  DUONEB Take 3 mLs by nebulization every 4 (four) hours as needed.   LORazepam 0.5 MG tablet Commonly known as:  ATIVAN Take 1 tablet (0.5 mg total) by mouth every 6 (six) hours as needed for anxiety.   megestrol 40 MG tablet Commonly known as:  MEGACE Take 1 tablet (40 mg total) by mouth daily.   multivitamin with minerals Tabs tablet Take 1 tablet by mouth daily.   nystatin 100000 UNIT/ML suspension Commonly known as:  MYCOSTATIN Use as directed 5 mLs (500,000 Units total) in the mouth or throat 4 (four) times daily.  omeprazole 20 MG capsule Commonly known as:  PRILOSEC Take 20 mg by mouth daily.   oxymetazoline 0.05 % nasal spray Commonly known as:  AFRIN Place 1 spray into both nostrils daily as needed for congestion.   predniSONE 20 MG tablet Commonly known as:  DELTASONE Take 2 tablets (40 mg total) by mouth daily.   predniSONE 10 MG (21) Tbpk tablet Commonly known as:  STERAPRED UNI-PAK 21 TAB Per packaging instructions   protein supplement Powd Take 12-18 g by mouth 3 (three) times daily with meals.   pseudoephedrine 30 MG tablet Commonly known as:  SUDAFED Take 30 mg by mouth daily as needed for congestion.   tiZANidine 4 MG tablet Commonly known as:  ZANAFLEX Take 1 tablet (4 mg total) by mouth Nightly.   Vitamin D 2000 units Caps Take 2,000 Units by mouth every evening.

## 2018-04-28 DIAGNOSIS — Z7189 Other specified counseling: Secondary | ICD-10-CM

## 2018-04-28 DIAGNOSIS — Z515 Encounter for palliative care: Secondary | ICD-10-CM | POA: Insufficient documentation

## 2018-04-29 ENCOUNTER — Telehealth: Payer: Self-pay | Admitting: Family Medicine

## 2018-04-29 MED ORDER — PREDNISONE 10 MG PO TABS: 10.0000 mg | ORAL_TABLET | Freq: Every day | ORAL | 3 refills | Status: DC

## 2018-04-29 NOTE — Telephone Encounter (Signed)
[name] notified as instructed by telephone.  Patient states understanding.

## 2018-04-29 NOTE — Telephone Encounter (Signed)
Can you call patient or wife?  I spoke to Dr. Mortimer Fries at pulmonary, and he thinks that starting prednisone at 10 mg every day would be an ok thing to do. So, when your dose pak is over, start taking 1 tablet each day.  I have already sent them in to [facility].

## 2018-05-01 ENCOUNTER — Emergency Department: Payer: Medicare HMO

## 2018-05-01 ENCOUNTER — Other Ambulatory Visit: Payer: Self-pay

## 2018-05-01 ENCOUNTER — Inpatient Hospital Stay
Admission: EM | Admit: 2018-05-01 | Discharge: 2018-05-08 | DRG: 286 | Disposition: A | Discharge status: Home Health | Payer: Medicare HMO | Attending: Internal Medicine | Admitting: Internal Medicine

## 2018-05-01 DIAGNOSIS — Z7951 Long term (current) use of inhaled steroids: Secondary | ICD-10-CM

## 2018-05-01 DIAGNOSIS — Z9119 Patient's noncompliance with other medical treatment and regimen: Secondary | ICD-10-CM

## 2018-05-01 DIAGNOSIS — M7989 Other specified soft tissue disorders: Secondary | ICD-10-CM | POA: Diagnosis present

## 2018-05-01 DIAGNOSIS — I5043 Acute on chronic combined systolic (congestive) and diastolic (congestive) heart failure: Secondary | ICD-10-CM

## 2018-05-01 DIAGNOSIS — E1142 Type 2 diabetes mellitus with diabetic polyneuropathy: Secondary | ICD-10-CM | POA: Diagnosis present

## 2018-05-01 DIAGNOSIS — Z7952 Long term (current) use of systemic steroids: Secondary | ICD-10-CM

## 2018-05-01 DIAGNOSIS — Y831 Surgical operation with implant of artificial internal device as the cause of abnormal reaction of the patient, or of later complication, without mention of misadventure at the time of the procedure: Secondary | ICD-10-CM | POA: Diagnosis present

## 2018-05-01 DIAGNOSIS — J9691 Respiratory failure, unspecified with hypoxia: Secondary | ICD-10-CM | POA: Diagnosis present

## 2018-05-01 DIAGNOSIS — I25118 Atherosclerotic heart disease of native coronary artery with other forms of angina pectoris: Secondary | ICD-10-CM | POA: Diagnosis not present

## 2018-05-01 DIAGNOSIS — I2510 Atherosclerotic heart disease of native coronary artery without angina pectoris: Secondary | ICD-10-CM | POA: Diagnosis present

## 2018-05-01 DIAGNOSIS — I255 Ischemic cardiomyopathy: Secondary | ICD-10-CM | POA: Diagnosis not present

## 2018-05-01 DIAGNOSIS — Z7982 Long term (current) use of aspirin: Secondary | ICD-10-CM

## 2018-05-01 DIAGNOSIS — R0602 Shortness of breath: Secondary | ICD-10-CM

## 2018-05-01 DIAGNOSIS — Z961 Presence of intraocular lens: Secondary | ICD-10-CM | POA: Diagnosis present

## 2018-05-01 DIAGNOSIS — H6690 Otitis media, unspecified, unspecified ear: Secondary | ICD-10-CM | POA: Diagnosis present

## 2018-05-01 DIAGNOSIS — Z9841 Cataract extraction status, right eye: Secondary | ICD-10-CM

## 2018-05-01 DIAGNOSIS — Z66 Do not resuscitate: Secondary | ICD-10-CM | POA: Diagnosis present

## 2018-05-01 DIAGNOSIS — J441 Chronic obstructive pulmonary disease with (acute) exacerbation: Secondary | ICD-10-CM | POA: Diagnosis present

## 2018-05-01 DIAGNOSIS — K219 Gastro-esophageal reflux disease without esophagitis: Secondary | ICD-10-CM | POA: Diagnosis present

## 2018-05-01 DIAGNOSIS — I959 Hypotension, unspecified: Secondary | ICD-10-CM | POA: Diagnosis not present

## 2018-05-01 DIAGNOSIS — Z8249 Family history of ischemic heart disease and other diseases of the circulatory system: Secondary | ICD-10-CM

## 2018-05-01 DIAGNOSIS — J849 Interstitial pulmonary disease, unspecified: Secondary | ICD-10-CM | POA: Diagnosis not present

## 2018-05-01 DIAGNOSIS — T82855A Stenosis of coronary artery stent, initial encounter: Secondary | ICD-10-CM | POA: Diagnosis present

## 2018-05-01 DIAGNOSIS — J449 Chronic obstructive pulmonary disease, unspecified: Secondary | ICD-10-CM | POA: Diagnosis present

## 2018-05-01 DIAGNOSIS — I110 Hypertensive heart disease with heart failure: Secondary | ICD-10-CM | POA: Diagnosis present

## 2018-05-01 DIAGNOSIS — R609 Edema, unspecified: Secondary | ICD-10-CM

## 2018-05-01 DIAGNOSIS — Z79899 Other long term (current) drug therapy: Secondary | ICD-10-CM

## 2018-05-01 DIAGNOSIS — B370 Candidal stomatitis: Secondary | ICD-10-CM | POA: Diagnosis not present

## 2018-05-01 DIAGNOSIS — Z955 Presence of coronary angioplasty implant and graft: Secondary | ICD-10-CM

## 2018-05-01 DIAGNOSIS — I252 Old myocardial infarction: Secondary | ICD-10-CM | POA: Diagnosis not present

## 2018-05-01 DIAGNOSIS — Z9842 Cataract extraction status, left eye: Secondary | ICD-10-CM

## 2018-05-01 DIAGNOSIS — J9611 Chronic respiratory failure with hypoxia: Secondary | ICD-10-CM | POA: Diagnosis not present

## 2018-05-01 DIAGNOSIS — J9621 Acute and chronic respiratory failure with hypoxia: Secondary | ICD-10-CM | POA: Diagnosis present

## 2018-05-01 DIAGNOSIS — F419 Anxiety disorder, unspecified: Secondary | ICD-10-CM | POA: Diagnosis present

## 2018-05-01 DIAGNOSIS — J309 Allergic rhinitis, unspecified: Secondary | ICD-10-CM | POA: Diagnosis present

## 2018-05-01 DIAGNOSIS — Z515 Encounter for palliative care: Secondary | ICD-10-CM | POA: Diagnosis not present

## 2018-05-01 DIAGNOSIS — I5023 Acute on chronic systolic (congestive) heart failure: Secondary | ICD-10-CM

## 2018-05-01 DIAGNOSIS — J8410 Pulmonary fibrosis, unspecified: Secondary | ICD-10-CM | POA: Diagnosis present

## 2018-05-01 DIAGNOSIS — E1143 Type 2 diabetes mellitus with diabetic autonomic (poly)neuropathy: Secondary | ICD-10-CM | POA: Diagnosis present

## 2018-05-01 DIAGNOSIS — Z9981 Dependence on supplemental oxygen: Secondary | ICD-10-CM

## 2018-05-01 DIAGNOSIS — E785 Hyperlipidemia, unspecified: Secondary | ICD-10-CM | POA: Diagnosis present

## 2018-05-01 DIAGNOSIS — Z951 Presence of aortocoronary bypass graft: Secondary | ICD-10-CM

## 2018-05-01 DIAGNOSIS — I471 Supraventricular tachycardia: Secondary | ICD-10-CM | POA: Diagnosis not present

## 2018-05-01 DIAGNOSIS — Z8673 Personal history of transient ischemic attack (TIA), and cerebral infarction without residual deficits: Secondary | ICD-10-CM

## 2018-05-01 DIAGNOSIS — C9310 Chronic myelomonocytic leukemia not having achieved remission: Secondary | ICD-10-CM | POA: Diagnosis present

## 2018-05-01 DIAGNOSIS — Z87891 Personal history of nicotine dependence: Secondary | ICD-10-CM

## 2018-05-01 DIAGNOSIS — Z7189 Other specified counseling: Secondary | ICD-10-CM | POA: Diagnosis not present

## 2018-05-01 HISTORY — DX: Chronic combined systolic (congestive) and diastolic (congestive) heart failure: I50.42

## 2018-05-01 LAB — COMPREHENSIVE METABOLIC PANEL
ALT: 70 U/L — AB (ref 0–44)
ANION GAP: 10 (ref 5–15)
AST: 56 U/L — ABNORMAL HIGH (ref 15–41)
Albumin: 3.8 g/dL (ref 3.5–5.0)
Alkaline Phosphatase: 78 U/L (ref 38–126)
BUN: 16 mg/dL (ref 8–23)
CHLORIDE: 92 mmol/L — AB (ref 98–111)
CO2: 32 mmol/L (ref 22–32)
Calcium: 9.1 mg/dL (ref 8.9–10.3)
Creatinine, Ser: 0.73 mg/dL (ref 0.61–1.24)
Glucose, Bld: 136 mg/dL — ABNORMAL HIGH (ref 70–99)
Potassium: 4.4 mmol/L (ref 3.5–5.1)
SODIUM: 134 mmol/L — AB (ref 135–145)
Total Bilirubin: 1 mg/dL (ref 0.3–1.2)
Total Protein: 7.8 g/dL (ref 6.5–8.1)

## 2018-05-01 LAB — CBC WITH DIFFERENTIAL/PLATELET
BASOS ABS: 0 10*3/uL (ref 0–0.1)
BASOS PCT: 0 %
EOS ABS: 0 10*3/uL (ref 0–0.7)
Eosinophils Relative: 0 %
HCT: 39.7 % — ABNORMAL LOW (ref 40.0–52.0)
Hemoglobin: 13.6 g/dL (ref 13.0–18.0)
LYMPHS ABS: 1.8 10*3/uL (ref 1.0–3.6)
Lymphocytes Relative: 14 %
MCH: 33.5 pg (ref 26.0–34.0)
MCHC: 34.2 g/dL (ref 32.0–36.0)
MCV: 98 fL (ref 80.0–100.0)
MONO ABS: 3.5 10*3/uL — AB (ref 0.2–1.0)
Monocytes Relative: 27 %
NEUTROS ABS: 7.6 10*3/uL — AB (ref 1.4–6.5)
Neutrophils Relative %: 59 %
PLATELETS: 238 10*3/uL (ref 150–440)
RBC: 4.06 MIL/uL — ABNORMAL LOW (ref 4.40–5.90)
RDW: 17 % — AB (ref 11.5–14.5)
WBC: 12.9 10*3/uL — ABNORMAL HIGH (ref 3.8–10.6)

## 2018-05-01 LAB — BRAIN NATRIURETIC PEPTIDE: B NATRIURETIC PEPTIDE 5: 1279 pg/mL — AB (ref 0.0–100.0)

## 2018-05-01 LAB — TROPONIN I: TROPONIN I: 0.04 ng/mL — AB (ref <0.03)

## 2018-05-01 MED ORDER — OXYMETAZOLINE HCL 0.05 % NA SOLN
1.0000 | Freq: Every day | NASAL | Status: DC | PRN
  Filled 2018-05-01: qty 15

## 2018-05-01 MED ORDER — ACETAMINOPHEN 500 MG PO TABS
1000.0000 mg | ORAL_TABLET | Freq: Every day | ORAL | Status: DC | PRN
  Administered 2018-05-07: 1000 mg via ORAL
  Filled 2018-05-01: qty 2

## 2018-05-01 MED ORDER — NYSTATIN 100000 UNIT/ML MT SUSP
5.0000 mL | Freq: Four times a day (QID) | OROMUCOSAL | Status: DC
  Administered 2018-05-01 – 2018-05-08 (×27): 500000 [IU] via OROMUCOSAL
  Filled 2018-05-01 (×28): qty 5

## 2018-05-01 MED ORDER — IPRATROPIUM-ALBUTEROL 0.5-2.5 (3) MG/3ML IN SOLN
3.0000 mL | Freq: Four times a day (QID) | RESPIRATORY_TRACT | Status: DC
  Administered 2018-05-01 – 2018-05-05 (×15): 3 mL via RESPIRATORY_TRACT
  Filled 2018-05-01 (×15): qty 3

## 2018-05-01 MED ORDER — VITAMIN D3 25 MCG (1000 UNIT) PO TABS
2000.0000 [IU] | ORAL_TABLET | Freq: Every evening | ORAL | Status: DC
  Administered 2018-05-01 – 2018-05-07 (×7): 2000 [IU] via ORAL
  Filled 2018-05-01 (×9): qty 2

## 2018-05-01 MED ORDER — SODIUM CHLORIDE 0.9% FLUSH: 3.0000 mL | INTRAVENOUS | Status: DC | PRN

## 2018-05-01 MED ORDER — PSEUDOEPHEDRINE HCL 30 MG PO TABS
30.0000 mg | ORAL_TABLET | Freq: Every day | ORAL | Status: DC | PRN
  Administered 2018-05-06 – 2018-05-07 (×2): 30 mg via ORAL
  Filled 2018-05-01 (×3): qty 1

## 2018-05-01 MED ORDER — METHYLPREDNISOLONE SODIUM SUCC 125 MG IJ SOLR
60.0000 mg | Freq: Three times a day (TID) | INTRAMUSCULAR | Status: DC
  Administered 2018-05-01 – 2018-05-03 (×7): 60 mg via INTRAVENOUS
  Filled 2018-05-01 (×7): qty 2

## 2018-05-01 MED ORDER — ONDANSETRON HCL 4 MG/2ML IJ SOLN
4.0000 mg | Freq: Four times a day (QID) | INTRAMUSCULAR | Status: DC | PRN
  Administered 2018-05-02 – 2018-05-07 (×3): 4 mg via INTRAVENOUS
  Filled 2018-05-01 (×3): qty 2

## 2018-05-01 MED ORDER — ACETAMINOPHEN 325 MG PO TABS
650.0000 mg | ORAL_TABLET | ORAL | Status: DC | PRN
  Administered 2018-05-07: 650 mg via ORAL
  Filled 2018-05-01: qty 2

## 2018-05-01 MED ORDER — SODIUM CHLORIDE 0.9% FLUSH
3.0000 mL | Freq: Two times a day (BID) | INTRAVENOUS | Status: DC
  Administered 2018-05-01 – 2018-05-07 (×10): 3 mL via INTRAVENOUS

## 2018-05-01 MED ORDER — GABAPENTIN 300 MG PO CAPS
300.0000 mg | ORAL_CAPSULE | Freq: Every day | ORAL | Status: DC
  Administered 2018-05-01 – 2018-05-07 (×7): 300 mg via ORAL
  Filled 2018-05-01 (×7): qty 1

## 2018-05-01 MED ORDER — TIZANIDINE HCL 4 MG PO TABS
4.0000 mg | ORAL_TABLET | Freq: Every evening | ORAL | Status: DC
  Administered 2018-05-01 – 2018-05-07 (×7): 4 mg via ORAL
  Filled 2018-05-01 (×7): qty 1

## 2018-05-01 MED ORDER — PREDNISONE 10 MG PO TABS: 10.0000 mg | ORAL_TABLET | Freq: Every day | ORAL | Status: DC

## 2018-05-01 MED ORDER — FUROSEMIDE 10 MG/ML IJ SOLN
20.0000 mg | Freq: Two times a day (BID) | INTRAMUSCULAR | Status: DC
  Administered 2018-05-01 – 2018-05-04 (×5): 20 mg via INTRAVENOUS
  Filled 2018-05-01 (×6): qty 2

## 2018-05-01 MED ORDER — PANTOPRAZOLE SODIUM 40 MG PO TBEC
40.0000 mg | DELAYED_RELEASE_TABLET | Freq: Every day | ORAL | Status: DC
  Administered 2018-05-01 – 2018-05-08 (×7): 40 mg via ORAL
  Filled 2018-05-01 (×7): qty 1

## 2018-05-01 MED ORDER — VITAMIN C 500 MG PO TABS
250.0000 mg | ORAL_TABLET | Freq: Two times a day (BID) | ORAL | Status: DC
  Administered 2018-05-01 – 2018-05-08 (×14): 250 mg via ORAL
  Filled 2018-05-01 (×12): qty 1

## 2018-05-01 MED ORDER — FUROSEMIDE 10 MG/ML IJ SOLN
60.0000 mg | Freq: Once | INTRAMUSCULAR | Status: AC
  Administered 2018-05-01: 60 mg via INTRAVENOUS
  Filled 2018-05-01: qty 8

## 2018-05-01 MED ORDER — ALBUTEROL SULFATE (2.5 MG/3ML) 0.083% IN NEBU
2.5000 mg | INHALATION_SOLUTION | RESPIRATORY_TRACT | Status: DC | PRN
  Administered 2018-05-01 – 2018-05-07 (×5): 2.5 mg via RESPIRATORY_TRACT
  Filled 2018-05-01 (×5): qty 3

## 2018-05-01 MED ORDER — ADULT MULTIVITAMIN W/MINERALS CH
1.0000 | ORAL_TABLET | Freq: Every day | ORAL | Status: DC
  Administered 2018-05-01 – 2018-05-08 (×7): 1 via ORAL
  Filled 2018-05-01 (×7): qty 1

## 2018-05-01 MED ORDER — ENOXAPARIN SODIUM 40 MG/0.4ML ~~LOC~~ SOLN
40.0000 mg | SUBCUTANEOUS | Status: DC
  Administered 2018-05-01 – 2018-05-02 (×2): 40 mg via SUBCUTANEOUS
  Filled 2018-05-01 (×2): qty 0.4

## 2018-05-01 MED ORDER — SODIUM CHLORIDE 0.9 % IV SOLN: 250.0000 mL | INTRAVENOUS | Status: DC | PRN

## 2018-05-01 MED ORDER — LORAZEPAM 0.5 MG PO TABS
0.5000 mg | ORAL_TABLET | Freq: Four times a day (QID) | ORAL | Status: DC | PRN
  Administered 2018-05-01 – 2018-05-07 (×15): 0.5 mg via ORAL
  Filled 2018-05-01 (×15): qty 1

## 2018-05-01 MED ORDER — ARFORMOTEROL TARTRATE 15 MCG/2ML IN NEBU
15.0000 ug | INHALATION_SOLUTION | Freq: Two times a day (BID) | RESPIRATORY_TRACT | Status: DC
  Administered 2018-05-01 – 2018-05-08 (×14): 15 ug via RESPIRATORY_TRACT
  Filled 2018-05-01 (×16): qty 2

## 2018-05-01 MED ORDER — POTASSIUM CHLORIDE CRYS ER 20 MEQ PO TBCR
20.0000 meq | EXTENDED_RELEASE_TABLET | Freq: Every day | ORAL | Status: DC
  Administered 2018-05-01 – 2018-05-02 (×2): 20 meq via ORAL
  Filled 2018-05-01 (×2): qty 1

## 2018-05-01 MED ORDER — AZITHROMYCIN 250 MG PO TABS
500.0000 mg | ORAL_TABLET | Freq: Every day | ORAL | Status: AC
  Administered 2018-05-01 – 2018-05-03 (×3): 500 mg via ORAL
  Filled 2018-05-01 (×3): qty 2

## 2018-05-01 MED ORDER — BENEPROTEIN PO POWD
2.0000 | Freq: Three times a day (TID) | ORAL | Status: DC
  Administered 2018-05-07 (×2): 12 g via ORAL
  Filled 2018-05-01: qty 227

## 2018-05-01 MED ORDER — CARVEDILOL 12.5 MG PO TABS
12.5000 mg | ORAL_TABLET | Freq: Two times a day (BID) | ORAL | Status: DC
  Administered 2018-05-01 – 2018-05-02 (×3): 12.5 mg via ORAL
  Filled 2018-05-01 (×3): qty 1

## 2018-05-01 MED ORDER — MEGESTROL ACETATE 20 MG PO TABS
40.0000 mg | ORAL_TABLET | Freq: Every day | ORAL | Status: DC
  Administered 2018-05-01 – 2018-05-08 (×7): 40 mg via ORAL
  Filled 2018-05-01 (×8): qty 2

## 2018-05-01 MED ORDER — BUDESONIDE 0.5 MG/2ML IN SUSP
0.5000 mg | Freq: Two times a day (BID) | RESPIRATORY_TRACT | Status: DC
  Administered 2018-05-01 – 2018-05-08 (×14): 0.5 mg via RESPIRATORY_TRACT
  Filled 2018-05-01 (×14): qty 2

## 2018-05-01 NOTE — Care Management Note (Signed)
Case Management Note  Patient Details  Name: Tony Watson MRN: [id number] Date of Birth: 16-Feb-1951  Subjective/Objective:                 Presents from home with shortness of breath.  He has been treated with steroids as outpatient by his pulmonologist but without improvement.  It is documented that patient's lasix ran out.  Patient reports that there were no refills left on the med.  he did not reach out to have it refilled.  He can not tell me the name of the physician on the bottle.  He has chronic home oxygen with [employer]. He has access to working scales.  has low EF.  Patient has been admitted for similar sx three tines in last 6 months.  He has has recent ED visit for shortness of breath and was discharged home. Current with his PCP.  Patient has not had any services such as home health set up during his previous discharges.  He is agreeable. No agency preference.  Would benefit from a heart failure and copd protocol. It sounds as if his transportation to the drug store and md appointments are not always dependable - especially if he has a lot of appointments. Would benefit from RN PT/OT (energy conservation) and SW. Palliative consult present for goals of care.  It does not appear that patient has been referred to the heart failure clinic  Action/Plan:  Anticipate home health services at discharge.  HF and COPD protocol unless a more palliative approach is indicated  Expected Discharge Date:                  Expected Discharge Plan:     In-House Referral:     Discharge planning Services     Post Acute Care Choice:    Choice offered to:     DME Arranged:    DME Agency:     HH Arranged:    HH Agency:     Status of Service:     If discussed at [employer], dates discussed:    Additional Comments:  Katrina Stack, RN 05/01/2018, 3:57 PM

## 2018-05-01 NOTE — ED Notes (Signed)
Pt arrives from home via [facility] c/o SOB hx CHF/COPD states has been out of lasix x1 week. Pitting edema noted bilateral lower extremities. Protocol initiated.

## 2018-05-01 NOTE — ED Notes (Signed)
Pt transported to 231. 

## 2018-05-01 NOTE — Progress Notes (Signed)
Pharmacist - Prescriber Communication  Enoxaparin dose modified from 30 mg subcutaneously once daily to 40 mg subcutaneously once daily due to CrCl greater than 30 ml/min and actual body weight greater than 52 kg.   Murray Durrell A. [place], [place].D., BCPS Clinical Pharmacist 05/01/18 13:03

## 2018-05-01 NOTE — Clinical Social Work Note (Signed)
CSW received consult for home hospice which is the purview of RNCM. CSW is signing off as a RNCM consult has already been placed. Please consult should needs change.  Santiago Bumpers, MSW, Latanya Presser [phone]

## 2018-05-01 NOTE — Consult Note (Signed)
Cardiology Consultation:   Patient ID: Tony Watson MRN: [id number]; DOB: Jun 04, 1951  Admit date: 05/01/2018 Date of Consult: 05/01/2018  Primary Care Provider: Owens Loffler, MD Primary Cardiologist: Kathlyn Sacramento, MD   Primary Electrophysiologist:    Patient Profile:   Tony Watson is a 67 y.o. male with a hx of coronary artery disease, congestive heart failure, severe COPD, diabetic neuropathy who is being seen today for the evaluation of progressive shortness of breath and leg swelling at the request of  Dr. Margaretmary Eddy .  History of Present Illness:   Mr. Mees is a 67 year old patient of Dr. Rockey Situ.  He said chronic shortness of breath for many years.  He is on home oxygen. His long standing reduction of left ventricular systolic function.  His ejection fraction is 25 to 30%.  He is been admitted for COPD exacerbations several  times this year. He developed leg edema several days ago Has chest tightness - inability to take a deep breath .  No angina   He still eats some salty foods.  He typically has lunch meat for lunch every day.  He does not go out of his way to eat salty foods and does not add salt.    Past Medical History:  Diagnosis Date  . Arthritis   . CAD (coronary artery disease)    inferior MI 11/99 (in [place]) with PCI to [facility]. last [place] 2004 [facility]): EF 50%, patent RCA stent. no obstructive disease. last myoview 2008: EF 42%, inferior infarct, no ischemia.   . Cancer ([place])    CMML [place]  . CHF (congestive heart failure) ([place])   . COPD (chronic obstructive pulmonary disease) ([place])   . Diabetic autonomic neuropathy associated with type 2 diabetes mellitus ([place]) 03/27/2015  . Diverticulosis 2002  . DM2 (diabetes mellitus, type 2) ([facility])    A1c 6.0% 12/11  . GERD (gastroesophageal reflux disease)   . HTN (hypertension)   . Hyperlipidemia   . Ischemic cardiomyopathy    mild with EF 42% on myoviews 2008.  . Medical non-compliance   . Nicotine dependence      chronic, active  . Smoker   . Spinal stenosis   . 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 TIA (transient ischemic attack)    hx; now s/p PFO closure 2004 (in [place])  . Ulcer     Past Surgical History:  Procedure Laterality Date  . BACK SURGERY     patient denies-just lumbar punctures  . BRONCHOSCOPY    . CARDIAC CATHETERIZATION  11/99   CI per [facility]  . CATARACT EXTRACTION W/PHACO Right 11/24/2016   Procedure: CATARACT EXTRACTION PHACO AND INTRAOCULAR LENS PLACEMENT (IOC);  Surgeon: Birder Robson, MD;  Location: [facility] ORS;  Service: Ophthalmology;  Laterality: Right;  [place] 1:04.3 AP% 22.3 CDE 14.35 Fluid pack lot # [id number]  . CATARACT EXTRACTION W/PHACO Left 12/29/2016   Procedure: CATARACT EXTRACTION PHACO AND INTRAOCULAR LENS PLACEMENT (IOC);  Surgeon: Birder Robson, MD;  Location: [facility] ORS;  Service: Ophthalmology;  Laterality: Left;  [place] 00:52 AP% 18.5 CDE 9.67 Fluid pack lot # [id number]  . COLONOSCOPY    . COLONOSCOPY WITH PROPOFOL N/A 05/25/2017   Procedure: COLONOSCOPY WITH PROPOFOL;  Surgeon: Jonathon Bellows, MD;  Location: [facility] ENDOSCOPY;  Service: Gastroenterology;  Laterality: N/A;  . CORONARY ANGIOPLASTY     STENT  . CORONARY ARTERY BYPASS GRAFT     stent  . ESOPHAGOGASTRODUODENOSCOPY (EGD) WITH PROPOFOL N/A 05/25/2017   Procedure: ESOPHAGOGASTRODUODENOSCOPY (EGD) WITH  PROPOFOL;  Surgeon: Jonathon Bellows, MD;  Location: [facility] ENDOSCOPY;  Service: Gastroenterology;  Laterality: N/A;  . EYE SURGERY    . FLEXIBLE BRONCHOSCOPY N/A 05/19/2017   Procedure: FLEXIBLE BRONCHOSCOPY;  Surgeon: Wilhelmina Mcardle, MD;  Location: [facility] ORS;  Service: Pulmonary;  Laterality: N/A;  . open heart surgery  2004   PFO repair  . UPPER GI ENDOSCOPY       Home Medications:  Prior to Admission medications   Medication Sig Start Date End Date Taking? Authorizing Provider  acetaminophen (TYLENOL) 500 MG tablet Take 1,000 mg by mouth daily as needed for moderate pain or headache.     [provider]  albuterol (PROVENTIL) (2.5 MG/3ML) 0.083% nebulizer solution Take 3 mLs (2.5 mg total) by nebulization every 4 (four) hours as needed for wheezing or shortness of breath. 03/06/18   Vaughan Basta, MD  budesonide (PULMICORT) 0.5 MG/2ML nebulizer solution Take 2 mLs (0.5 mg total) by nebulization 2 (two) times daily. 03/06/18 03/06/19  Vaughan Basta, MD  carvedilol (COREG) 6.25 MG tablet Take 12.5 mg by mouth 2 (two) times daily with a meal.    [provider]  Cholecalciferol (VITAMIN D) 2000 units CAPS Take 2,000 Units by mouth every evening.    [provider]  formoterol (PERFOROMIST) 20 MCG/2ML nebulizer solution Take 2 mLs (20 mcg total) by nebulization 2 (two) times daily. 02/24/18   [name], MD  furosemide (LASIX) 20 MG tablet Take 1 tablet (20 mg total) by mouth daily. 04/27/18   Copland, Frederico Hamman, MD  gabapentin (NEURONTIN) 300 MG capsule Take 1 capsule (300 mg total) by mouth at bedtime. 03/23/18   Copland, Frederico Hamman, MD  ipratropium-albuterol (DUONEB) 0.5-2.5 (3) MG/3ML SOLN Take 3 mLs by nebulization every 4 (four) hours as needed. 03/11/18   [provider]  LORazepam (ATIVAN) 0.5 MG tablet Take 1 tablet (0.5 mg total) by mouth every 6 (six) hours as needed for anxiety. 04/06/18   Copland, Frederico Hamman, MD  megestrol (MEGACE) 40 MG tablet Take 1 tablet (40 mg total) by mouth daily. 04/26/17   Lloyd Huger, MD  Multiple Vitamin (MULTIVITAMIN WITH MINERALS) TABS tablet Take 1 tablet by mouth daily. 03/07/18   Vaughan Basta, MD  nystatin (MYCOSTATIN) 100000 UNIT/ML suspension Use as directed 5 mLs (500,000 Units total) in the mouth or throat 4 (four) times daily. 03/23/18   Copland, Frederico Hamman, MD  omeprazole (PRILOSEC) 20 MG capsule Take 20 mg by mouth daily.    [provider]  oxymetazoline (AFRIN) 0.05 % nasal spray Place 1 spray into both nostrils daily as needed for congestion.    [provider]    predniSONE (DELTASONE) 10 MG tablet Take 1 tablet (10 mg total) by mouth daily with breakfast. 04/29/18   Copland, Frederico Hamman, MD  protein supplement (RESOURCE BENEPROTEIN) POWD Take 12-18 g by mouth 3 (three) times daily with meals. 03/06/18   Vaughan Basta, MD  pseudoephedrine (SUDAFED) 30 MG tablet Take 30 mg by mouth daily as needed for congestion.     [provider]  tiZANidine (ZANAFLEX) 4 MG tablet Take 1 tablet (4 mg total) by mouth Nightly. 03/23/18   Copland, Frederico Hamman, MD  vitamin C (VITAMIN C) 250 MG tablet Take 1 tablet (250 mg total) by mouth 2 (two) times daily. 03/06/18   Vaughan Basta, MD    Inpatient Medications: Scheduled Meds: . arformoterol  15 mcg Nebulization Q12H  . budesonide  0.5 mg Nebulization BID  . carvedilol  12.5 mg Oral BID WC  .  cholecalciferol  2,000 Units Oral QPM  . enoxaparin (LOVENOX) injection  40 mg Subcutaneous Q24H  . furosemide  20 mg Intravenous Q12H  . gabapentin  300 mg Oral QHS  . ipratropium-albuterol  3 mL Nebulization Q6H  . megestrol  40 mg Oral Daily  . multivitamin with minerals  1 tablet Oral Daily  . nystatin  5 mL Mouth/Throat QID  . pantoprazole  40 mg Oral QAC breakfast  . [START ON 05/02/2018] predniSONE  10 mg Oral Q breakfast  . protein supplement  2-3 scoop Oral TID WC  . sodium chloride flush  3 mL Intravenous Q12H  . tiZANidine  4 mg Oral Nightly  . ascorbic acid  250 mg Oral BID   Continuous Infusions: . sodium chloride     PRN Meds: sodium chloride, acetaminophen, acetaminophen, albuterol, LORazepam, ondansetron (ZOFRAN) IV, oxymetazoline, pseudoephedrine, sodium chloride flush  Allergies:   No Known Allergies  Social History:   Social History   Socioeconomic History  . Marital status: Married    Spouse name: Not on file  . Number of children: Not on file  . Years of education: Not on file  . Highest education level: Not on file  Occupational History  . Not on file  Social Needs  .  Financial resource strain: Not on file  . Food insecurity:    Worry: Not on file    Inability: Not on file  . Transportation needs:    Medical: Not on file    Non-medical: Not on file  Tobacco Use  . Smoking status: Former Smoker    Packs/day: 0.50    Years: 45.00    Pack years: 22.50    Types: Cigarettes    Last attempt to quit: 01/25/2018    Years since quitting: 0.2  . Smokeless tobacco: Never Used  . Tobacco comment: 1 ppd +40 years  Substance and Sexual Activity  . Alcohol use: Not Currently    Alcohol/week: 0.0 standard drinks    Comment: weekly but last dose 1 month  . Drug use: No  . Sexual activity: Not on file  Lifestyle  . Physical activity:    Days per week: Not on file    Minutes per session: Not on file  . Stress: Not on file  Relationships  . Social connections:    Talks on phone: Not on file    Gets together: Not on file    Attends religious service: Not on file    Active member of club or organization: Not on file    Attends meetings of clubs or organizations: Not on file    Relationship status: Not on file  . Intimate partner violence:    Fear of current or ex partner: Not on file    Emotionally abused: Not on file    Physically abused: Not on file    Forced sexual activity: Not on file  Other Topics Concern  . Not on file  Social History Narrative   Married, 1 daughter.  Lives in [place], [place]    Family History:    Family History  Problem Relation Age of Onset  . Coronary artery disease Unknown        family hx  . Breast cancer Unknown        1st egree relative <50  . Cancer Mother   . Heart disease Father      ROS:  Please see the history of present illness.   All other ROS reviewed and negative.  Physical Exam/Data:   Vitals:   05/01/18 1100 05/01/18 1200 05/01/18 1253 05/01/18 1321  BP: 135/90 (!) 138/96 120/82   Pulse: (!) 110 (!) 114 (!) 110   Resp: _0 Temp:   97.6 F (36.4 C)   TempSrc:   Oral   SpO2: 93% 90%  (!) 87% 92%  Weight:      Height:        Intake/Output Summary (Last 24 hours) at 05/01/2018 1337 Last data filed at 05/01/2018 1202 Gross per 24 hour  Intake -  Output 200 ml  Net -200 ml   Filed Weights   05/01/18 1047  Weight: 68.4 kg   Body mass index is 21.03 kg/m.  General:   Thin, chronically ill-appearing gentleman, no acute distress. HEENT: normal Lymph: no adenopathy Neck: no JVD Endocrine:  No thryomegaly Vascular: No carotid bruits; FA pulses 2+ bilaterally without bruits  Cardiac: 1 S2. Lungs: Lateral wheezing Abd: soft, nontender, no hepatomegaly  Ext:  Thin 1-2 + pitting edema  Musculoskeletal:  No deformities, BUE and BLE strength normal and equal Skin: warm and dry  Neuro:  CNs 2-12 intact, no focal abnormalities noted Psych:  Normal affect   EKG:    Telemetry:  Telemetry was personally reviewed and demonstrates:    Relevant CV Studies:    Laboratory Data:  Chemistry Recent Labs  Lab 04/24/18 1454 05/01/18 1024  NA 135 134*  K 3.7 4.4  CL 95* 92*  CO2 30 32  GLUCOSE 127* 136*  BUN 10 16  CREATININE 0.73 0.73  CALCIUM 8.9 9.1  GFRNONAA >60 >60  GFRAA >60 >60  ANIONGAP 10 10    Recent Labs  Lab 05/01/18 1024  PROT 7.8  ALBUMIN 3.8  AST 56*  ALT 70*  ALKPHOS 78  BILITOT 1.0   Hematology Recent Labs  Lab 04/24/18 1454 05/01/18 1024  WBC 8.8 12.9*  RBC 3.64* 4.06*  HGB 12.4* 13.6  HCT 35.0* 39.7*  MCV 96.4 98.0  MCH 34.2* 33.5  MCHC 35.4 34.2  RDW 16.7* 17.0*  PLT 217 238   Cardiac Enzymes Recent Labs  Lab 04/24/18 1454 05/01/18 1024  TROPONINI 0.03* 0.04*   No results for input(s): TROPIPOC in the last 168 hours.  BNP Recent Labs  Lab 05/01/18 1024  BNP 1,279.0*    DDimer No results for input(s): DDIMER in the last 168 hours.  Radiology/Studies:  Dg Chest Portable 1 View  Result Date: 05/01/2018 CLINICAL DATA:  Acute shortness of breath for 2-3 days. EXAM: PORTABLE CHEST 1 VIEW COMPARISON:  04/24/2018  chest radiograph, 05/13/2017 chest CT and prior studies FINDINGS: UPPER limits normal heart size and median sternotomy again noted. Diffuse interstitial opacities are again noted. No definite new pulmonary opacities noted. No pleural effusion, pneumothorax or acute bony abnormalities identified. IMPRESSION: No evidence of acute cardiopulmonary disease. Chronic interstitial lung disease. Electronically Signed   By: Margarette Canada M.D.   On: 05/01/2018 10:44    Assessment and Plan:   1. 1.  Acute on chronic combined systolic and diastolic congestive heart failure: The patient has known congestive heart failure.  Echocardiogram in July, 2017  Shows an ejection fraction of 25 to 30%.  We were not able to evaluate his diastolic function.  Suspect most of this is due to COPD.  Does appear to have some extra volume -    he has leg edema. We will start him on Lasix and potassium and follow his blood pressure fairly  closely.  2.  COPD exacerbation: He is actively wheezing.  He states that he wheezes on a regular basis. Management per the internal medicine team.  3.  History of coronary artery disease: The patient has a history of coronary artery disease.  Is not having any angina at present.       For questions or updates, please contact [place] Please consult [email or web address] for contact info under     Signed, Mertie Moores, MD  05/01/2018 1:37 PM

## 2018-05-01 NOTE — Progress Notes (Signed)
   05/01/18 1945  Clinical Encounter Type  Visited With Patient  Visit Type Initial (order for advanced directive)  Referral From Nurse  Consult/Referral To Chaplain   Chaplain responded to order for advanced directive.  Chaplain reviewed document with patient, providing education.  Patient intends to review document with his spouse when she returns tomorrow.  Chaplain encouraged patient to have chaplain paged with any questions and/or when he is ready to complete the document.  Patient also reported that his spouse may want to complete an advanced directive at the same time.

## 2018-05-01 NOTE — Consult Note (Signed)
[facility] Consultation      Date: 05/01/2018,   MRN# [id number] Tony Watson September 05, 1950 Code Status:     Code Status Orders  (From admission, onward)         Start     Ordered   05/01/18 1255  Full code  Continuous     05/01/18 1254        Code Status History    Date Active Date Inactive Code Status Order ID Comments User Context   03/24/2018 1948 03/28/2018 1340 Full Code [id number]  Saundra Shelling, MD Inpatient   03/03/2018 0328 03/06/2018 1716 Full Code [id number]  Harrie Foreman, MD Inpatient     Hosp day:_0 @ Referring MD: _1 @     PCP:      AdmissionWeight: 68.4 kg                 CurrentWeight: 68.4 kg MAHMOOD Watson is a 67 y.o. old male seen in consultation for resp failure  at the request of Gouru,A.     CHIEF COMPLAINT:   sob   HISTORY OF PRESENT ILLNESS   [name] is a 67 year old patient who is consulted [place] with increasing sob with leg swelling for last 2 days.  He has chronic shortness of breath for many years and is  on home oxygen.Over the last 2 days he became increasingly SOB with leg swelling. Last he had sit up all night long to get some sleep.he also has some chest tightness  With inability to take a deep breath .he has no fever but had some  chills or . No abd pain. He has dry cough. He reports ruuning out of lasix for last few days says that he had no refills.he denis non compliance with other meds or any sig increase in salt and fluid intake. He is not an active smoker or drinker.    PAST MEDICAL HISTORY   Past Medical History:  Diagnosis Date  . Arthritis   . CAD (coronary artery disease)    inferior MI 11/99 (in [place]) with PCI to [facility]. last [place] 2004 [facility]): EF 50%, patent RCA stent. no obstructive disease. last myoview 2008: EF 42%, inferior infarct, no ischemia.   . 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 CHF (congestive heart failure) ([place])   . COPD (chronic obstructive pulmonary disease)  ([place])   . Diabetic autonomic neuropathy associated with type 2 diabetes mellitus ([place]) 03/27/2015  . Diverticulosis 2002  . DM2 (diabetes mellitus, type 2) ([facility])    A1c 6.0% 12/11  . GERD (gastroesophageal reflux disease)   . HTN (hypertension)   . Hyperlipidemia   . Ischemic cardiomyopathy    mild with EF 42% on myoviews 2008.  . Medical non-compliance   . Nicotine dependence    chronic, active  . Smoker   . Spinal stenosis   . 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 TIA (transient ischemic attack)    hx; now s/p PFO closure 2004 (in [place])  . Ulcer      SURGICAL HISTORY   Past Surgical History:  Procedure Laterality Date  . BACK SURGERY     patient denies-just lumbar punctures  . BRONCHOSCOPY    . CARDIAC CATHETERIZATION  11/99   CI per [facility]  . CATARACT EXTRACTION W/PHACO Right 11/24/2016   Procedure: CATARACT EXTRACTION PHACO AND INTRAOCULAR LENS PLACEMENT (IOC);  Surgeon: Birder Robson, MD;  Location: [facility] ORS;  Service: Ophthalmology;  Laterality: Right;  [place] 1:04.3 AP% 22.3 CDE 14.35 Fluid pack lot # [id number]  . CATARACT EXTRACTION W/PHACO Left 12/29/2016   Procedure: CATARACT EXTRACTION PHACO AND INTRAOCULAR LENS PLACEMENT (IOC);  Surgeon: Birder Robson, MD;  Location: [facility] ORS;  Service: Ophthalmology;  Laterality: Left;  [place] 00:52 AP% 18.5 CDE 9.67 Fluid pack lot # [id number]  . COLONOSCOPY    . COLONOSCOPY WITH PROPOFOL N/A 05/25/2017   Procedure: COLONOSCOPY WITH PROPOFOL;  Surgeon: Jonathon Bellows, MD;  Location[facility] ENDOSCOPY;  Service: Gastroenterology;  Laterality: N/A;  . CORONARY ANGIOPLASTY     STENT  . CORONARY ARTERY BYPASS GRAFT     stent  . ESOPHAGOGASTRODUODENOSCOPY (EGD) WITH PROPOFOL N/A 05/25/2017   Procedure: ESOPHAGOGASTRODUODENOSCOPY (EGD) WITH PROPOFOL;  Surgeon: Jonathon Bellows, MD;  Location: [facility] ENDOSCOPY;  Service: Gastroenterology;  Laterality: N/A;  . EYE SURGERY    . FLEXIBLE BRONCHOSCOPY N/A 05/19/2017   Procedure: FLEXIBLE  BRONCHOSCOPY;  Surgeon: Wilhelmina Mcardle, MD;  Location: [facility] ORS;  Service: Pulmonary;  Laterality: N/A;  . open heart surgery  2004   PFO repair  . UPPER GI ENDOSCOPY       FAMILY HISTORY   Family History  Problem Relation Age of Onset  . Coronary artery disease Unknown        family hx  . Breast cancer Unknown        1st egree relative <50  . Cancer Mother   . Heart disease Father      SOCIAL HISTORY   Social History   Tobacco Use  . Smoking status: Former Smoker    Packs/day: 0.50    Years: 45.00    Pack years: 22.50    Types: Cigarettes    Last attempt to quit: 01/25/2018    Years since quitting: 0.2  . Smokeless tobacco: Never Used  . Tobacco comment: 1 ppd +40 years  Substance Use Topics  . Alcohol use: Not Currently    Alcohol/week: 0.0 standard drinks    Comment: weekly but last dose 1 month  . Drug use: No     MEDICATIONS    Home Medication:    Current Medication:  Current Facility-Administered Medications:  .  0.9 %  sodium chloride infusion, 250 mL, Intravenous, PRN, Gouru, Aruna, MD .  acetaminophen (TYLENOL) tablet 1,000 mg, 1,000 mg, Oral, Daily PRN, Gouru, Aruna, MD .  acetaminophen (TYLENOL) tablet 650 mg, 650 mg, Oral, Q4H PRN, Gouru, Aruna, MD .  albuterol (PROVENTIL) (2.5 MG/3ML) 0.083% nebulizer solution 2.5 mg, 2.5 mg, Nebulization, Q4H PRN, Gouru, Aruna, MD .  arformoterol (BROVANA) nebulizer solution 15 mcg, 15 mcg, Nebulization, Q12H, Gouru, Aruna, MD .  budesonide (PULMICORT) nebulizer solution 0.5 mg, 0.5 mg, Nebulization, BID, Gouru, Aruna, MD .  carvedilol (COREG) tablet 12.5 mg, 12.5 mg, Oral, BID WC, Gouru, Aruna, MD .  cholecalciferol (VITAMIN D) tablet 2,000 Units, 2,000 Units, Oral, QPM, Gouru, Aruna, MD .  enoxaparin (LOVENOX) injection 40 mg, 40 mg, Subcutaneous, Q24H, Gouru, Aruna, MD .  furosemide (LASIX) injection 20 mg, 20 mg, Intravenous, Q12H, Gouru, Aruna, MD, 20 mg at 05/01/18 1418 .  gabapentin (NEURONTIN) capsule  300 mg, 300 mg, Oral, QHS, Gouru, Aruna, MD .  ipratropium-albuterol (DUONEB) 0.5-2.5 (3) MG/3ML nebulizer solution 3 mL, 3 mL, Nebulization, Q6H, Gouru, Aruna, MD, 3 mL at 05/01/18 1321 .  LORazepam (ATIVAN) tablet 0.5 mg, 0.5 mg, Oral, Q6H PRN, Gouru, Aruna, MD .  megestrol (MEGACE) tablet 40 mg, 40 mg, Oral, Daily, Gouru, Aruna, MD, 40 mg at 05/01/18 1419 .  methylPREDNISolone sodium succinate (SOLU-MEDROL) 125 mg/2 mL injection 60 mg, 60 mg, Intravenous, Q8H, Gouru, Aruna, MD .  multivitamin with minerals tablet 1 tablet, 1 tablet, Oral, Daily, Gouru, Aruna, MD, 1 tablet at 05/01/18 1418 .  nystatin (MYCOSTATIN) 100000 UNIT/ML suspension 500,000 Units, 5 mL, Mouth/Throat, QID, Gouru, Aruna, MD, 500,000 Units at 05/01/18 1418 .  ondansetron (ZOFRAN) injection 4 mg, 4 mg, Intravenous, Q6H PRN, Gouru, Aruna, MD .  oxymetazoline (AFRIN) 0.05 % nasal spray 1 spray, 1 spray, Each Nare, Daily PRN, Gouru, Aruna, MD .  pantoprazole (PROTONIX) EC tablet 40 mg, 40 mg, Oral, QAC breakfast, Gouru, Aruna, MD, 40 mg at 05/01/18 1419 .  potassium chloride SA (K-DUR,KLOR-CON) CR tablet 20 mEq, 20 mEq, Oral, Daily, Nahser, Wonda Cheng, MD, 20 mEq at 05/01/18 1419 .  protein supplement (RESOURCE BENEPROTEIN) powder 12-18 g, 2-3 scoop, Oral, TID WC, Gouru, Aruna, MD .  pseudoephedrine (SUDAFED) tablet 30 mg, 30 mg, Oral, Daily PRN, Gouru, Aruna, MD .  sodium chloride flush (NS) 0.9 % injection 3 mL, 3 mL, Intravenous, Q12H, Gouru, Aruna, MD, 3 mL at 05/01/18 1419 .  sodium chloride flush (NS) 0.9 % injection 3 mL, 3 mL, Intravenous, PRN, Gouru, Aruna, MD .  tiZANidine (ZANAFLEX) tablet 4 mg, 4 mg, Oral, Nightly, Gouru, Aruna, MD .  vitamin C (ASCORBIC ACID) tablet 250 mg, 250 mg, Oral, BID, Gouru, Aruna, MD, 250 mg at 05/01/18 1418    ALLERGIES   Patient has no known allergies.     REVIEW OF SYSTEMS    Review of Systems:  Gen:  Denies  fever, sweats, chills weigh loss  HEENT: Denies blurred vision,  double vision, ear pain, eye pain, hearing loss, nose bleeds, sore throat Cardiac:  See HPI Resp:  See HPI Gi: Denies swallowing difficulty, stomach pain, nausea or vomiting, diarrhea, constipation, bowel incontinence Gu:  Denies bladder incontinence, burning urine Ext:   Denies Joint pain, stiffness or swelling Skin: Denies  skin rash, easy bruising or bleeding or hives Endoc:  Denies polyuria, polydipsia , polyphagia or weight change Psych:   Denies depression, insomnia or hallucinations   Other:  All other systems negative   VS: BP 122/87 (BP Location: Right Arm)   Pulse (!) 115   Temp 97.6 F (36.4 C) (Oral)   Resp 14   Ht _0  (1.803 m)   Wt 68.4 kg   SpO2 100%   BMI 21.03 kg/m      PHYSICAL EXAM  Physical Examination:   GENERAL:appears sob HEAD: Normocephalic, atraumatic.  EYES: Pupils equal, round, reactive to light. Extraocular muscles intact. No scleral icterus.  MOUTH: Moist mucosal membrane. Dentition intact. No abscess noted.  EAR, NOSE, THROAT: Clear without exudates. No external lesions.  NECK: Supple. No thyromegaly. No nodules. No JVD.  PULMONARY: prolonged expiration with bilateral diffuse wheezes and scattered crackles CARDIOVASCULAR: S1 and S2. Regular rate and rhythm. No murmurs, rubs, or gallops. No edema. Pedal pulses 2+ bilaterally.  GASTROINTESTINAL: Soft, nontender, nondistended. No masses. Positive bowel sounds. No hepatosplenomegaly.  MUSCULOSKELETAL: +2 pedal pitting edema NEUROLOGIC: Cranial nerves II through XII are intact. No gross focal neurological deficits. Sensation intact. Reflexes intact.  SKIN: No ulceration, lesions, rashes, or cyanosis. Skin warm and dry. Turgor intact.  PSYCHIATRIC: Mood, affect within normal limits. The patient is awake, alert and oriented x 3. Insight, judgment intact.     LABS Reviewed Elevated BNP of 1279 Nl renal fnx WBC 12.9 H&H 13.6&39.7  IMAGING    Dg Chest 2  View  Result Date:  04/24/2018 CLINICAL DATA:  Shortness of breath 2 months worsening over the past few days with cough and congestion. Oxygen dependent. EXAM: CHEST - 2 VIEW COMPARISON:  03/24/2018 and 02/27/2018 FINDINGS: Lungs are adequately inflated demonstrate diffuse bilateral increased interstitial markings without change. No consolidation or effusion. Cardiomediastinal silhouette and remainder of the exam is unchanged. IMPRESSION: Stable chronic diffuse interstitial disease.  No acute findings. Electronically Signed   By: Marin Olp M.D.   On: 04/24/2018 15:22   Dg Chest Portable 1 View  Result Date: 05/01/2018 CLINICAL DATA:  Acute shortness of breath for 2-3 days. EXAM: PORTABLE CHEST 1 VIEW COMPARISON:  04/24/2018 chest radiograph, 05/13/2017 chest CT and prior studies FINDINGS: UPPER limits normal heart size and median sternotomy again noted. Diffuse interstitial opacities are again noted. No definite new pulmonary opacities noted. No pleural effusion, pneumothorax or acute bony abnormalities identified. IMPRESSION: No evidence of acute cardiopulmonary disease. Chronic interstitial lung disease. Electronically Signed   By: Margarette Canada M.D.   On: 05/01/2018 10:44       ASSESSMENT/PLAN   1) Acute on chronic hypoxemic resp failure related to acute CHF and AECOPD.  cont o2 as needed. No need of bipap at present.  2) HFrEF management as per card.  3) AECOPD agree with IV steroids and nebs. Will add po abx for 3 to 5  days.  4) ILD out pt bronch bx c/w RB ILD. D/dx [facility]. He has stopped smoking  5. H/o indolent CMML.  Other disease management as per Hospitalist. D/w Dr.Gouru,A   Patient/Family are satisfied with Plan of action and management. All questions answered  Rosine Door, MD  05/01/2018 3:34 PM [name]

## 2018-05-01 NOTE — Progress Notes (Signed)
Family Meeting Note  Advance Directive:yes  Today a meeting took place with the Patient, wife at bedside     The following clinical team members were present during this meeting:MD  The following were discussed:Patient's diagnosis: Acute on chronic respiratory failure with hypoxemia acute on chronic diastolic and systolic congestive heart failure, severe COPD is end-stage,, other comorbidities and treatment plan of care discussed in detail with the patient.  He verbalized understanding of the plan.   Arthritis    . CAD (coronary artery disease)    inferior MI 11/99 (in [place]) with PCI to [facility]. last [place] 2004 [facility]): EF 50%, patent RCA stent. no obstructive disease. last myoview 2008: EF 42%, inferior infarct, no ischemia.   . 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 CHF (congestive heart failure) ([place])   . COPD (chronic obstructive pulmonary disease) ([place])   . Diabetic autonomic neuropathy associated with type 2 diabetes mellitus ([place]) 03/27/2015  . Diverticulosis 2002  . DM2 (diabetes mellitus, type 2) ([facility])    A1c 6.0% 12/11  . GERD (gastroesophageal reflux disease)   . HTN (hypertension)   . Hyperlipidemia   . Ischemic cardiomyopathy    mild with EF 42% on myoviews 2008.  . Medical non-compliance   . Nicotine dependence    chronic, active  . Smoker   . Spinal stenosis   . 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 TIA (transient ischemic attack)    hx; now s/p PFO closure 2004 (in [place])  . Ulcer        Patient's progosis: Unable to determine and Goals for treatment: Full Code  Wife [name] is the healthcare power of attorney  Additional follow-up to be provided: Hospitalist, cardiology, pulmonology and palliative care  Time spent during discussion:18 min  Nicholes Mango, MD

## 2018-05-01 NOTE — Progress Notes (Deleted)
[place]  [phone] [phone]  ID: [name] OB: [birth year]  MR#: [id number]  ZDG#:[id number]  Patient Care Team: Owens Loffler, MD as PCP - General Wellington Hampshire, MD as PCP - Cardiology (Cardiology) Leonel Ramsay, MD (Infectious Diseases) [name], MD as Consulting Physician (Pulmonary Disease) Wellington Hampshire, MD as Consulting Physician (Cardiology)  CHIEF COMPLAINT: Bone marrow biopsy proven CMML-1.  INTERVAL HISTORY: Patient returns to clinic today for repeat laboratory work and routine 29-monthfollow-up.  He continues to have mild weight loss, but otherwise feels well. He continues to have persistent weakness and fatigue. He denies any fevers or night sweats. He has no neurologic complaints. He has no chest pain or shortness of breath. He denies any nausea, vomiting, constipation, or diarrhea.  He has no melena or hematochezia.  He has no urinary complaints.  Patient offers no further specific complaints today.    REVIEW OF SYSTEMS:   Review of Systems  Constitutional: Positive for malaise/fatigue and weight loss. Negative for diaphoresis and fever.  HENT: Negative.  Negative for sore throat.   Respiratory: Negative for cough and shortness of breath.   Cardiovascular: Negative.  Negative for chest pain and leg swelling.  Gastrointestinal: Negative.  Negative for abdominal pain, nausea and vomiting.  Genitourinary: Negative.  Negative for dysuria.  Musculoskeletal: Negative.  Negative for back pain.  Neurological: Positive for weakness. Negative for sensory change and focal weakness.  Endo/Heme/Allergies: Does not bruise/bleed easily.  Psychiatric/Behavioral: Negative.  The patient is not nervous/anxious.     As per HPI. Otherwise, a complete review of systems is negative.  PAST MEDICAL HISTORY: Past Medical History:  Diagnosis Date  . Arthritis   . CAD (coronary artery disease)    inferior [place] [facility] [place] [facility]: EF 50%, patent RCA stent. no obstructive disease. last myoview 2008: EF 42%, inferior infarct, no ischemia.   . Cancer ([place]    CMML [place]  . CHF (congestive heart failure) ([place]   . COPD [place]chronic obstructive pulmonary disease) ([place]   . Diabetic autonomic neuropathy associated with type 2 diabetes mellitus ([place] 03/27/2015  . Diverticulosis 2002  . DM2 (diabetes mellitus, type 2) (H[facility])    A1c 6.0% 12/11  . GERD (gastroesophageal reflux disease)   . HTN (hypertension)   . Hyperlipidemia   . Ischemic cardiomyopathy    mild with EF 42% on myoviews 2008.  . Medical non-compliance   . Nicotine dependence    chronic, active  . Smoker   . Spinal stenosis   . 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 TIA (transient ischemic attack)    hx; now s/p PFO closure 2004 ([place]  . Ulcer     PAST SURGICAL HISTORY: Past Surgical History:  Procedure Laterality Date  . BACK SURGERY     patient denies-just lumbar punctures  . BRONCHOSCOPY    . CARDIAC CATHETERIZATION  11/99   CI per [facility]  . CATARACT EXTRACTION W/PHACO Right 11/24/2016   Procedure: CATARACT EXTRACTION PHACO AND INTRAOCULAR LENS PLACEMENT (IOC);  Surgeon: WBirder Robson MD;  Location: [facility] ORS;  Service: Ophthalmology;  Laterality: Right;  [place]04.3 AP% 22.3 CDE 14.35 Fluid pack lot # [id number]  . CATARACT EXTRACTION W/PHACO Left 12/29/2016   Procedure: CATARACT EXTRACTION PHACO AND INTRAOCULAR LENS PLACEMENT (IOC);  Surgeon: PBirder Robson MD;  Location: [facility] ORS;  Service: Ophthalmology;  Laterality: Left;  [place]:52 AP% 18.5 CDE  9.67 Fluid pack lot # [id number]  . COLONOSCOPY    . COLONOSCOPY WITH PROPOFOL N/A 05/25/2017   Procedure: COLONOSCOPY WITH PROPOFOL;  Surgeon: Jonathon Bellows, MD;  Location: [facility] ENDOSCOPY;  Service: Gastroenterology;  Laterality: N/A;  . CORONARY ANGIOPLASTY     STENT  . CORONARY ARTERY BYPASS GRAFT     stent  . ESOPHAGOGASTRODUODENOSCOPY  (EGD) WITH PROPOFOL N/A 05/25/2017   Procedure: ESOPHAGOGASTRODUODENOSCOPY (EGD) WITH PROPOFOL;  Surgeon: Jonathon Bellows, MD;  Location[facility] ENDOSCOPY;  Service: Gastroenterology;  Laterality: N/A;  . EYE SURGERY    . FLEXIBLE BRONCHOSCOPY N/A 05/19/2017   Procedure: FLEXIBLE BRONCHOSCOPY;  Surgeon: Wilhelmina Mcardle, MD;  Location: [facility] ORS;  Service: Pulmonary;  Laterality: N/A;  . open heart surgery  2004   PFO repair  . UPPER GI ENDOSCOPY      FAMILY HISTORY Family History  Problem Relation Age of Onset  . Coronary artery disease Unknown        family hx  . Breast cancer Unknown        1st egree relative <50  . Cancer Mother   . Heart disease Father        ADVANCED DIRECTIVES:    HEALTH MAINTENANCE: Social History   Tobacco Use  . Smoking status: Former Smoker    Packs/day: 0.50    Years: 45.00    Pack years: 22.50    Types: Cigarettes    Last attempt to quit: 01/25/2018    Years since quitting: 0.2  . Smokeless tobacco: Never Used  . Tobacco comment: 1 ppd +40 years  Substance Use Topics  . Alcohol use: Not Currently    Alcohol/week: 0.0 standard drinks    Comment: weekly but last dose 1 month  . Drug use: No     Colonoscopy:  PAP:  Bone density:  Lipid panel:  No Known Allergies  No current facility-administered medications for this visit.    No current outpatient medications on file.   Facility-Administered Medications Ordered in Other Visits  Medication Dose Route Frequency Provider Last Rate Last Dose  . 0.9 %  sodium chloride infusion  250 mL Intravenous PRN Gouru, Aruna, MD      . acetaminophen (TYLENOL) tablet 1,000 mg  1,000 mg Oral Daily PRN Gouru, Aruna, MD      . acetaminophen (TYLENOL) tablet 650 mg  650 mg Oral Q4H PRN Gouru, Aruna, MD      . albuterol (PROVENTIL) (2.5 MG/3ML) 0.083% nebulizer solution 2.5 mg  2.5 mg Nebulization Q4H PRN Gouru, Aruna, MD   2.5 mg at 05/01/18 1810  . arformoterol (BROVANA) nebulizer solution 15 mcg  15 mcg  Nebulization Q12H Gouru, Aruna, MD   15 mcg at 05/01/18 2113  . azithromycin (ZITHROMAX) tablet 500 mg  500 mg Oral Daily Rosine Door, MD   500 mg at 05/01/18 1702  . budesonide (PULMICORT) nebulizer solution 0.5 mg  0.5 mg Nebulization BID Gouru, Aruna, MD   0.5 mg at 05/01/18 2112  . carvedilol (COREG) tablet 12.5 mg  12.5 mg Oral BID WC Gouru, Aruna, MD   12.5 mg at 05/01/18 1702  . cholecalciferol (VITAMIN D) tablet 2,000 Units  2,000 Units Oral QPM Nicholes Mango, MD   2,000 Units at 05/01/18 1829  . enoxaparin (LOVENOX) injection 40 mg  40 mg Subcutaneous Q24H Gouru, Aruna, MD   40 mg at 05/01/18 1829  . furosemide (LASIX) injection 20 mg  20 mg Intravenous Q12H Gouru, Illene Silver, MD   20 mg at 05/01/18  1418  . gabapentin (NEURONTIN) capsule 300 mg  300 mg Oral QHS Gouru, Aruna, MD   300 mg at 05/01/18 2307  . ipratropium-albuterol (DUONEB) 0.5-2.5 (3) MG/3ML nebulizer solution 3 mL  3 mL Nebulization Q6H Gouru, Aruna, MD   3 mL at 05/01/18 2112  . LORazepam (ATIVAN) tablet 0.5 mg  0.5 mg Oral Q6H PRN Gouru, Aruna, MD   0.5 mg at 05/01/18 1842  . megestrol (MEGACE) tablet 40 mg  40 mg Oral Daily Gouru, Aruna, MD   40 mg at 05/01/18 1419  . methylPREDNISolone sodium succinate (SOLU-MEDROL) 125 mg/2 mL injection 60 mg  60 mg Intravenous Q8H Gouru, Aruna, MD   60 mg at 05/01/18 2307  . multivitamin with minerals tablet 1 tablet  1 tablet Oral Daily Gouru, Aruna, MD   1 tablet at 05/01/18 1418  . nystatin (MYCOSTATIN) 100000 UNIT/ML suspension 500,000 Units  5 mL Mouth/Throat QID Nicholes Mango, MD   500,000 Units at 05/01/18 2307  . ondansetron (ZOFRAN) injection 4 mg  4 mg Intravenous Q6H PRN Gouru, Aruna, MD      . oxymetazoline (AFRIN) 0.05 % nasal spray 1 spray  1 spray Each Nare Daily PRN Gouru, Aruna, MD      . pantoprazole (PROTONIX) EC tablet 40 mg  40 mg Oral QAC breakfast Gouru, Aruna, MD   40 mg at 05/01/18 1419  . potassium chloride SA (K-DUR,KLOR-CON) CR tablet 20 mEq  20 mEq Oral Daily  Nahser, Wonda Cheng, MD   20 mEq at 05/01/18 1419  . protein supplement (RESOURCE BENEPROTEIN) powder 12-18 g  2-3 scoop Oral TID WC Gouru, Aruna, MD      . pseudoephedrine (SUDAFED) tablet 30 mg  30 mg Oral Daily PRN Gouru, Aruna, MD      . sodium chloride flush (NS) 0.9 % injection 3 mL  3 mL Intravenous Q12H Gouru, Aruna, MD   3 mL at 05/01/18 2308  . sodium chloride flush (NS) 0.9 % injection 3 mL  3 mL Intravenous PRN Gouru, Aruna, MD      . tiZANidine (ZANAFLEX) tablet 4 mg  4 mg Oral Nightly Gouru, Aruna, MD   4 mg at 05/01/18 2307  . vitamin C (ASCORBIC ACID) tablet 250 mg  250 mg Oral BID Gouru, Aruna, MD   250 mg at 05/01/18 2307    OBJECTIVE: There were no vitals filed for this visit.   There is no height or weight on file to calculate BMI.    ECOG FS:0 - Asymptomatic  General: Thin, no acute distress. Eyes: Pink conjunctiva, anicteric sclera. HEENT: Normocephalic, moist mucous membranes, clear oropharnyx. Lungs: Clear to auscultation bilaterally. Heart: Regular rate and rhythm. No rubs, murmurs, or gallops. Abdomen: Soft, nontender, nondistended. No organomegaly noted, normoactive bowel sounds. Musculoskeletal: No edema, cyanosis, or clubbing. Neuro: Alert, answering all questions appropriately. Cranial nerves grossly intact. Skin: No rashes or petechiae noted. Psych: Normal affect.  LAB RESULTS:  Lab Results  Component Value Date   NA 134 (L) 05/01/2018   K 4.4 05/01/2018   CL 92 (L) 05/01/2018   CO2 32 05/01/2018   GLUCOSE 136 (H) 05/01/2018   BUN 16 05/01/2018   CREATININE 0.73 05/01/2018   CALCIUM 9.1 05/01/2018   PROT 7.8 05/01/2018   ALBUMIN 3.8 05/01/2018   AST 56 (H) 05/01/2018   ALT 70 (H) 05/01/2018   ALKPHOS 78 05/01/2018   BILITOT 1.0 05/01/2018   GFRNONAA >60 05/01/2018   GFRAA >60 05/01/2018    Lab Results  Component Value Date   WBC 12.9 (H) 05/01/2018   NEUTROABS 7.6 (H) 05/01/2018   HGB 13.6 05/01/2018   HCT 39.7 (L) 05/01/2018   MCV 98.0  05/01/2018   PLT 238 05/01/2018     STUDIES: Dg Chest 2 View  Result Date: 04/24/2018 CLINICAL DATA:  Shortness of breath 2 months worsening over the past few days with cough and congestion. Oxygen dependent. EXAM: CHEST - 2 VIEW COMPARISON:  03/24/2018 and 02/27/2018 FINDINGS: Lungs are adequately inflated demonstrate diffuse bilateral increased interstitial markings without change. No consolidation or effusion. Cardiomediastinal silhouette and remainder of the exam is unchanged. IMPRESSION: Stable chronic diffuse interstitial disease.  No acute findings. Electronically Signed   By: Marin Olp M.D.   On: 04/24/2018 15:22   Dg Chest Portable 1 View  Result Date: 05/01/2018 CLINICAL DATA:  Acute shortness of breath for 2-3 days. EXAM: PORTABLE CHEST 1 VIEW COMPARISON:  04/24/2018 chest radiograph, 05/13/2017 chest CT and prior studies FINDINGS: UPPER limits normal heart size and median sternotomy again noted. Diffuse interstitial opacities are again noted. No definite new pulmonary opacities noted. No pleural effusion, pneumothorax or acute bony abnormalities identified. IMPRESSION: No evidence of acute cardiopulmonary disease. Chronic interstitial lung disease. Electronically Signed   By: Margarette Canada M.D.   On: 05/01/2018 10:44    ASSESSMENT: Bone marrow biopsy proven CMML-1.  PLAN:    1. CMML: Bone marrow biopsy completed on August 30, 2015 confirmed the diagnosis. Repeat bone marrow biopsy on March 08, 2017 is essentially unchanged. Patient's absolute monocytosis has been greater than 1.0 and persistent since at least July 2014.  More recently it has trended up and is now 4.2.  The remainder of his blood work including BCR-ABL and ANA are negative or within normal limits. Previously, hepatitis C and HIV were also negative.  He possibly will need treatment with azacitidine or decitabine in the future, but not at this time.  He continues to have no evidence of end organ involvement. CT scans  from February 24, 2017 revealed stable lung nodules.  Return to clinic in 3 months with repeat laboratory work and further evaluation.   2. Pulmonary nodules: Most recent CT scan revealed unchanged.  Continue follow-up with pulmonology as indicated. 3. Weight loss: CT scans and bone marrow biopsy as above. Thyroid panel within normal limits. Encouraged supplements such as Ensure and Boost. Continue Megace 40 mg daily.  Patient has been evaluated by pulmonology with bronchoscopy, GI with endoscopies, and infectious disease without any obvious etiology for his weight loss.  Appreciate dietary input.   Approximately 20 minutes was spent in discussion of which greater than 50% was consultation.   Patient expressed understanding and was in agreement with this plan. He also understands that He can call clinic at any time with any questions, concerns, or complaints.    Lloyd Huger, MD   05/01/2018 11:10 PM

## 2018-05-01 NOTE — Plan of Care (Signed)

## 2018-05-01 NOTE — ED Provider Notes (Signed)
[facility] Emergency Department Provider Note    First MD Initiated Contact with Patient 05/01/18 1017     (approximate)  I have reviewed the triage vital signs and the nursing notes.   HISTORY  Chief Complaint Shortness of Breath    HPI Tony Watson is a 67 y.o. male since the ER due to worsening shortness of breath or past several days as well as leg swelling.  Patient does wear chronic home oxygen typically around 3-4 but had to increase it to 5 today.  Has not had any cough.  Shortness of breath is worse when laying flat.  Is been taking some nebulizer treatments without any significant improvement.  Denies any fevers or productive cough.    Past Medical History:  Diagnosis Date  . Arthritis   . CAD (coronary artery disease)    inferior MI 11/99 (in [place]) with PCI to [facility]. last [place] 2004 [facility]): EF 50%, patent RCA stent. no obstructive disease. last myoview 2008: EF 42%, inferior infarct, no ischemia.   . Cancer ([place])    CMML [place]  . CHF (congestive heart failure) ([place])   . COPD (chronic obstructive pulmonary disease) ([place])   . Diabetic autonomic neuropathy associated with type 2 diabetes mellitus ([place]) 03/27/2015  . Diverticulosis 2002  . DM2 (diabetes mellitus, type 2) (H[facility])    A1c 6.0% 12/11  . GERD (gastroesophageal reflux disease)   . HTN (hypertension)   . Hyperlipidemia   . Ischemic cardiomyopathy    mild with EF 42% on myoviews 2008.  . Medical non-compliance   . Nicotine dependence    chronic, active  . Smoker   . Spinal stenosis   . 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 TIA (transient ischemic attack)    hx; now s/p PFO closure 2004 (in [place])  . Ulcer    Family History  Problem Relation Age of Onset  . Coronary artery disease Unknown        family hx  . Breast cancer Unknown        1st egree relative <50  . Cancer Mother   . Heart disease Father    Past Surgical History:  Procedure Laterality Date  .  BACK SURGERY     patient denies-just lumbar punctures  . BRONCHOSCOPY    . CARDIAC CATHETERIZATION  11/99   CI per [facility]  . CATARACT EXTRACTION W/PHACO Right 11/24/2016   Procedure: CATARACT EXTRACTION PHACO AND INTRAOCULAR LENS PLACEMENT (IOC);  Surgeon: Birder Robson, MD;  Location: [facility] ORS;  Service: Ophthalmology;  Laterality: Right;  [place] 1:04.3 AP% 22.3 CDE 14.35 Fluid pack lot # [id number]  . CATARACT EXTRACTION W/PHACO Left 12/29/2016   Procedure: CATARACT EXTRACTION PHACO AND INTRAOCULAR LENS PLACEMENT (IOC);  Surgeon: Birder Robson, MD;  Location: [facility] ORS;  Service: Ophthalmology;  Laterality: Left;  [place] 00:52 AP% 18.5 CDE 9.67 Fluid pack lot # [id number]  . COLONOSCOPY    . COLONOSCOPY WITH PROPOFOL N/A 05/25/2017   Procedure: COLONOSCOPY WITH PROPOFOL;  Surgeon: Jonathon Bellows, MD;  Location: [facility] ENDOSCOPY;  Service: Gastroenterology;  Laterality: N/A;  . CORONARY ANGIOPLASTY     STENT  . CORONARY ARTERY BYPASS GRAFT     stent  . ESOPHAGOGASTRODUODENOSCOPY (EGD) WITH PROPOFOL N/A 05/25/2017   Procedure: ESOPHAGOGASTRODUODENOSCOPY (EGD) WITH PROPOFOL;  Surgeon: Jonathon Bellows, MD;  Location: [facility] ENDOSCOPY;  Service: Gastroenterology;  Laterality: N/A;  . EYE SURGERY    . FLEXIBLE BRONCHOSCOPY N/A 05/19/2017   Procedure: FLEXIBLE  BRONCHOSCOPY;  Surgeon: Wilhelmina Mcardle, MD;  Location: [facility] ORS;  Service: Pulmonary;  Laterality: N/A;  . open heart surgery  2004   PFO repair  . UPPER GI ENDOSCOPY     Patient Active Problem List   Diagnosis Date Noted  . DNR / DNI, Advanced Directives Counselling 04/27/2018 04/28/2018    Class: Acute  . Ischemic cardiomyopathy 03/24/2018  . Respiratory failure with hypoxia ([place]) 03/03/2018  . Protein-calorie malnutrition, severe 03/03/2018  . 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 Diabetic autonomic neuropathy associated with type 2 diabetes mellitus ([place]) 03/27/2015  . History of inferior MI  (myocardial infarction) 10/14/2013  . CAD (coronary artery disease)   . Hypertensive heart disease   . History of noncompliance with medical treatment 05/30/2012  . 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 Severe chronic obstructive pulmonary disease ([place]) 03/23/2011  . Other emphysema ([place]) 03/23/2011  . ALLERGIC RHINITIS CAUSE UNSPECIFIED 07/31/2010  . Type 2 diabetes mellitus with vascular disease ([place]) 01/02/2010  . TOBACCO USE 04/17/2009  . TRANSIENT ISCHEMIC ATTACKS, HX OF 04/17/2009  . Hyperlipidemia 12/19/2008      Prior to Admission medications   Medication Sig Start Date End Date Taking? Authorizing Provider  acetaminophen (TYLENOL) 500 MG tablet Take 1,000 mg by mouth daily as needed for moderate pain or headache.    [provider]  albuterol (PROVENTIL) (2.5 MG/3ML) 0.083% nebulizer solution Take 3 mLs (2.5 mg total) by nebulization every 4 (four) hours as needed for wheezing or shortness of breath. 03/06/18   Vaughan Basta, MD  budesonide (PULMICORT) 0.5 MG/2ML nebulizer solution Take 2 mLs (0.5 mg total) by nebulization 2 (two) times daily. 03/06/18 03/06/19  Vaughan Basta, MD  carvedilol (COREG) 6.25 MG tablet Take 12.5 mg by mouth 2 (two) times daily with a meal.    [provider]  Cholecalciferol (VITAMIN D) 2000 units CAPS Take 2,000 Units by mouth every evening.    [provider]  formoterol (PERFOROMIST) 20 MCG/2ML nebulizer solution Take 2 mLs (20 mcg total) by nebulization 2 (two) times daily. 02/24/18   [name], MD  furosemide (LASIX) 20 MG tablet Take 1 tablet (20 mg total) by mouth daily. 04/27/18   Copland, Frederico Hamman, MD  gabapentin (NEURONTIN) 300 MG capsule Take 1 capsule (300 mg total) by mouth at bedtime. 03/23/18   Copland, Frederico Hamman, MD  ipratropium-albuterol (DUONEB) 0.5-2.5 (3) MG/3ML SOLN Take 3 mLs by nebulization every 4 (four) hours as needed. 03/11/18   [provider]  LORazepam (ATIVAN) 0.5 MG tablet Take 1 tablet (0.5 mg total) by mouth every 6 (six) hours as needed for anxiety. 04/06/18   Copland, Frederico Hamman, MD  megestrol (MEGACE) 40 MG tablet Take 1 tablet (40 mg total) by mouth daily. 04/26/17   Lloyd Huger, MD  Multiple Vitamin (MULTIVITAMIN WITH MINERALS) TABS tablet Take 1 tablet by mouth daily. 03/07/18   Vaughan Basta, MD  nystatin (MYCOSTATIN) 100000 UNIT/ML suspension Use as directed 5 mLs (500,000 Units total) in the mouth or throat 4 (four) times daily. 03/23/18   Copland, Frederico Hamman, MD  omeprazole (PRILOSEC) 20 MG capsule Take 20 mg by mouth daily.    [provider]  oxymetazoline (AFRIN) 0.05 % nasal spray Place 1 spray into both nostrils daily as needed for congestion.    [provider]  predniSONE (DELTASONE) 10 MG tablet Take 1 tablet (10 mg total) by mouth daily with breakfast. 04/29/18  Copland, Spencer, MD  protein supplement (RESOURCE BENEPROTEIN) POWD Take 12-18 g by mouth 3 (three) times daily with meals. 03/06/18   Vaughan Basta, MD  pseudoephedrine (SUDAFED) 30 MG tablet Take 30 mg by mouth daily as needed for congestion.     [provider]  tiZANidine (ZANAFLEX) 4 MG tablet Take 1 tablet (4 mg total) by mouth Nightly. 03/23/18   Copland, Frederico Hamman, MD  vitamin C (VITAMIN C) 250 MG tablet Take 1 tablet (250 mg total) by mouth 2 (two) times daily. 03/06/18   Vaughan Basta, MD    Allergies Patient has no known allergies.    Social History Social History   Tobacco Use  . Smoking status: Former Smoker    Packs/day: 0.50    Years: 45.00    Pack years: 22.50    Types: Cigarettes    Last attempt to quit: 01/25/2018    Years since quitting: 0.2  . Smokeless tobacco: Never Used  . Tobacco comment: 1 ppd +40 years  Substance Use Topics  . Alcohol use: Not Currently    Alcohol/week: 0.0 standard drinks    Comment: weekly but last dose 1 month  . Drug use: No     Review of Systems Patient denies headaches, rhinorrhea, blurry vision, numbness, shortness of breath, chest pain, edema, cough, abdominal pain, nausea, vomiting, diarrhea, dysuria, fevers, rashes or hallucinations unless otherwise stated above in HPI. ____________________________________________   PHYSICAL EXAM:  VITAL SIGNS: Vitals:   05/01/18 1321 05/01/18 1400  BP:  122/87  Pulse:  (!) 115  Resp:  14  Temp:    SpO2: 92% 100%    Constitutional: Alert and oriented. In mild respiratory distress Eyes: Conjunctivae are normal.  Head: Atraumatic. Nose: No congestion/rhinnorhea. Mouth/Throat: Mucous membranes are moist.   Neck: No stridor. Painless ROM.  Cardiovascular: Normal rate, regular rhythm. Grossly normal heart sounds.  Good peripheral circulation. Respiratory: mild tachypnea.  No retractions. Lungs with bibasilar crackles Gastrointestinal: Soft and nontender. No distention. No abdominal bruits. No CVA tenderness. Genitourinary:  Musculoskeletal: No lower extremity tenderness nor edema.  No joint effusions. Neurologic:  Normal speech and language. No gross focal neurologic deficits are appreciated. No facial droop Skin:  Skin is warm, dry and intact. No rash noted. Psychiatric: Mood and affect are normal. Speech and behavior are normal.  ____________________________________________   LABS (all labs ordered are listed, but only abnormal results are displayed)  Results for orders placed or performed during the hospital encounter of 05/01/18 (from the past 24 hour(s))  CBC with Differential/Platelet     Status: Abnormal   Collection Time: 05/01/18 10:24 AM  Result Value Ref Range   WBC 12.9 (H) 3.8 - 10.6 K/uL   RBC 4.06 (L) 4.40 - 5.90 MIL/uL   Hemoglobin 13.6 13.0 - 18.0 g/dL   HCT 39.7 (L) 40.0 - 52.0 %   MCV 98.0 80.0 - 100.0 fL   MCH 33.5 26.0 - 34.0 pg   MCHC 34.2 32.0 - 36.0 g/dL   RDW 17.0 (H) 11.5 - 14.5 %   Platelets 238 150 - 440 K/uL   Neutrophils  Relative % 59 %   Lymphocytes Relative 14 %   Monocytes Relative 27 %   Eosinophils Relative 0 %   Basophils Relative 0 %   Neutro Abs 7.6 (H) 1.4 - 6.5 K/uL   Lymphs Abs 1.8 1.0 - 3.6 K/uL   Monocytes Absolute 3.5 (H) 0.2 - 1.0 K/uL   Eosinophils Absolute 0.0 0 - 0.7 K/uL  Basophils Absolute 0.0 0 - 0.1 K/uL   RBC Morphology MIXED RBC POPULATION    WBC Morphology SMEAR STAINED AND AVAILABLE FOR REVIEW   Comprehensive metabolic panel     Status: Abnormal   Collection Time: 05/01/18 10:24 AM  Result Value Ref Range   Sodium 134 (L) 135 - 145 mmol/L   Potassium 4.4 3.5 - 5.1 mmol/L   Chloride 92 (L) 98 - 111 mmol/L   CO2 32 22 - 32 mmol/L   Glucose, Bld 136 (H) 70 - 99 mg/dL   BUN 16 8 - 23 mg/dL   Creatinine, Ser 0.73 0.61 - 1.24 mg/dL   Calcium 9.1 8.9 - 10.3 mg/dL   Total Protein 7.8 6.5 - 8.1 g/dL   Albumin 3.8 3.5 - 5.0 g/dL   AST 56 (H) 15 - 41 U/L   ALT 70 (H) 0 - 44 U/L   Alkaline Phosphatase 78 38 - 126 U/L   Total Bilirubin 1.0 0.3 - 1.2 mg/dL   GFR calc non Af Amer >60 >60 mL/min   GFR calc Af Amer >60 >60 mL/min   Anion gap 10 5 - 15  Troponin I     Status: Abnormal   Collection Time: 05/01/18 10:24 AM  Result Value Ref Range   Troponin I 0.04 (HH) <0.03 ng/mL  Brain natriuretic peptide     Status: Abnormal   Collection Time: 05/01/18 10:24 AM  Result Value Ref Range   B Natriuretic Peptide 1,279.0 (H) 0.0 - 100.0 pg/mL   ____________________________________________  EKG My review and personal interpretation at Time: 10:25   Indication: sob  Rate: 115  Rhythm: sinus Axis: normal Other: normal intervals, rbbb, nonspecific st abn, no stemi ____________________________________________  RADIOLOGY  I personally reviewed all radiographic images ordered to evaluate for the above acute complaints and reviewed radiology reports and findings.  These findings were personally discussed with the patient.  Please see medical record for radiology  report.  ____________________________________________   PROCEDURES  Procedure(s) performed:  .Critical Care Performed by: Merlyn Lot, MD Authorized by: Merlyn Lot, MD   Critical care provider statement:    Critical care time (minutes):  40   Critical care time was exclusive of:  Separately billable procedures and treating other patients   Critical care was necessary to treat or prevent imminent or life-threatening deterioration of the following conditions:  Respiratory failure   Critical care was time spent personally by me on the following activities:  Development of treatment plan with patient or surrogate, discussions with consultants, evaluation of patient's response to treatment, examination of patient, obtaining history from patient or surrogate, ordering and performing treatments and interventions, ordering and review of laboratory studies, ordering and review of radiographic studies, pulse oximetry, re-evaluation of patient's condition and review of old charts      Critical Care performed: yes ____________________________________________   INITIAL IMPRESSION / [place] / ED COURSE  Pertinent labs & imaging results that were available during my care of the patient were reviewed by me and considered in my medical decision making (see chart for details).   DDX: Asthma, copd, CHF, pna, ptx, malignancy, Pe, anemia   OLLIVANDER SEE is a 67 y.o. who presents to the ED with symptom in his as described above.  Patient does have mild respiratory distress.  Exam seems most clinically consistent with CHF.  Possible COPD.  Blood will be sent for the above differential.  Clinical Course as of May 02 1523  Sun May 01, 2018  1143 Patient's blood work I do suspect some worsening congestive heart failure and volume overload as he does have worsening lower extremity edema.  No significant change with nebulizer treatment.  We will give IV Lasix and reassess.   Discussed case with hospitalist who kindly agrees to admit patient for further medical management.   [PR]    Clinical Course User Index [PR] Merlyn Lot, MD     As part of my medical decision making, I reviewed the following data within the [facility] notes reviewed and incorporated, Labs reviewed, notes from prior ED visits .   ____________________________________________   FINAL CLINICAL IMPRESSION(S) / ED DIAGNOSES  Final diagnoses:  Acute on chronic respiratory failure with hypoxia ([place])      NEW MEDICATIONS STARTED DURING THIS VISIT:  Current Discharge Medication List       Note:  This document was prepared using Dragon voice recognition software and may include unintentional dictation errors.    Merlyn Lot, MD 05/01/18 1525

## 2018-05-01 NOTE — H&P (Signed)
[place] [place] NAME: Tony Watson    MR#:  [id number]  DATE OF BIRTH:  02-17-1951  DATE OF ADMISSION:  05/01/2018  PRIMARY CARE PHYSICIAN: Owens Loffler, MD   REQUESTING/REFERRING PHYSICIAN: Merlyn Lot  CHIEF COMPLAINT:  Shortness of breath  HISTORY OF PRESENT ILLNESS:  Tony Watson  is a 67 y.o. male with a known history of end-stage COPD, chronic bronchitis sees Dr. Mortimer Fries as an outpatient on chronic p.o. prednisone 10 mg daily basis, chronic congestive heart failure and multiple other medical problems is presenting to the ED with a chief complaint of shortness of breath.  Patient reports that he has gained significant weight associated with lower extremity swelling and shortness of breath for the past 2 to 3 days.  Patient usually lives on 2 to 3 L of oxygen at his baseline but today he was requiring 4 to 5 L of oxygen and elevated BNP at 1279.  Lasix 60 mg IV was given and hospitalist team is called to admit the patient.  Troponin is slightly elevated at 0.04.  Wife and granddaughter at bedside.  Patient sees Dr. Rockey Situ and Dr. Ashby Dawes as an outpatient.  Denies any chest pain fevers  PAST MEDICAL HISTORY:   Past Medical History:  Diagnosis Date  . Arthritis   . CAD (coronary artery disease)    inferior MI 11/99 (in [place]) with PCI to [facility]. last [place] 2004 [facility]): EF 50%, patent RCA stent. no obstructive disease. last myoview 2008: EF 42%, inferior infarct, no ischemia.   . 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 CHF (congestive heart failure) ([place])   . COPD (chronic obstructive pulmonary disease) ([place])   . Diabetic autonomic neuropathy associated with type 2 diabetes mellitus ([place]) 03/27/2015  . Diverticulosis 2002  . DM2 (diabetes mellitus, type 2) ([facility])    A1c 6.0% 12/11  . GERD (gastroesophageal reflux disease)   . HTN (hypertension)   . Hyperlipidemia   . Ischemic cardiomyopathy    mild with EF 42% on myoviews  2008.  . Medical non-compliance   . Nicotine dependence    chronic, active  . Smoker   . Spinal stenosis   . 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 TIA (transient ischemic attack)    hx; now s/p PFO closure 2004 (in [place])  . Ulcer     PAST SURGICAL HISTOIRY:   Past Surgical History:  Procedure Laterality Date  . BACK SURGERY     patient denies-just lumbar punctures  . BRONCHOSCOPY    . CARDIAC CATHETERIZATION  11/99   CI per [facility]  . CATARACT EXTRACTION W/PHACO Right 11/24/2016   Procedure: CATARACT EXTRACTION PHACO AND INTRAOCULAR LENS PLACEMENT (IOC);  Surgeon: Birder Robson, MD;  Location: [facility] ORS;  Service: Ophthalmology;  Laterality: Right;  [place] 1:04.3 AP% 22.3 CDE 14.35 Fluid pack lot # [id number]  . CATARACT EXTRACTION W/PHACO Left 12/29/2016   Procedure: CATARACT EXTRACTION PHACO AND INTRAOCULAR LENS PLACEMENT (IOC);  Surgeon: Birder Robson, MD;  Location: [facility] ORS;  Service: Ophthalmology;  Laterality: Left;  [place] 00:52 AP% 18.5 CDE 9.67 Fluid pack lot # [id number]  . COLONOSCOPY    . COLONOSCOPY WITH PROPOFOL N/A 05/25/2017   Procedure: COLONOSCOPY WITH PROPOFOL;  Surgeon: Jonathon Bellows, MD;  Location: [facility] ENDOSCOPY;  Service: Gastroenterology;  Laterality: N/A;  . CORONARY ANGIOPLASTY     STENT  . CORONARY ARTERY BYPASS GRAFT     stent  . ESOPHAGOGASTRODUODENOSCOPY (  EGD) WITH PROPOFOL N/A 05/25/2017   Procedure: ESOPHAGOGASTRODUODENOSCOPY (EGD) WITH PROPOFOL;  Surgeon: Jonathon Bellows, MD;  Location: [facility] ENDOSCOPY;  Service: Gastroenterology;  Laterality: N/A;  . EYE SURGERY    . FLEXIBLE BRONCHOSCOPY N/A 05/19/2017   Procedure: FLEXIBLE BRONCHOSCOPY;  Surgeon: Wilhelmina Mcardle, MD;  Location: [facility] ORS;  Service: Pulmonary;  Laterality: N/A;  . open heart surgery  2004   PFO repair  . UPPER GI ENDOSCOPY      SOCIAL HISTORY:   Social History   Tobacco Use  . Smoking status: Former Smoker    Packs/day: 0.50    Years: 45.00    Pack years: 22.50     Types: Cigarettes    Last attempt to quit: 01/25/2018    Years since quitting: 0.2  . Smokeless tobacco: Never Used  . Tobacco comment: 1 ppd +40 years  Substance Use Topics  . Alcohol use: Not Currently    Alcohol/week: 0.0 standard drinks    Comment: weekly but last dose 1 month    FAMILY HISTORY:   Family History  Problem Relation Age of Onset  . Coronary artery disease Unknown        family hx  . Breast cancer Unknown        1st egree relative <50  . Cancer Mother   . Heart disease Father     DRUG ALLERGIES:  No Known Allergies  REVIEW OF SYSTEMS:  CONSTITUTIONAL: No fever, fatigue or weakness.  EYES: No blurred or double vision.  EARS, NOSE, AND THROAT: No tinnitus or ear pain.  RESPIRATORY: Reporting intermittent episodes of cough, shortness of breath, wheezing, denies hemoptysis.  CARDIOVASCULAR: No chest pain, orthopnea, edema.  GASTROINTESTINAL: No nausea, vomiting, diarrhea or abdominal pain.  GENITOURINARY: No dysuria, hematuria.  ENDOCRINE: No polyuria, nocturia,  HEMATOLOGY: No anemia, easy bruising or bleeding SKIN: No rash or lesion. MUSCULOSKELETAL: No joint pain or arthritis.   NEUROLOGIC: No tingling, numbness, weakness.  PSYCHIATRY: No anxiety or depression.   MEDICATIONS AT HOME:   Prior to Admission medications   Medication Sig Start Date End Date Taking? Authorizing Provider  acetaminophen (TYLENOL) 500 MG tablet Take 1,000 mg by mouth daily as needed for moderate pain or headache.    [provider]  albuterol (PROVENTIL) (2.5 MG/3ML) 0.083% nebulizer solution Take 3 mLs (2.5 mg total) by nebulization every 4 (four) hours as needed for wheezing or shortness of breath. 03/06/18   Vaughan Basta, MD  budesonide (PULMICORT) 0.5 MG/2ML nebulizer solution Take 2 mLs (0.5 mg total) by nebulization 2 (two) times daily. 03/06/18 03/06/19  Vaughan Basta, MD  carvedilol (COREG) 6.25 MG tablet Take 12.5 mg by mouth 2 (two) times daily  with a meal.    [provider]  Cholecalciferol (VITAMIN D) 2000 units CAPS Take 2,000 Units by mouth every evening.    [provider]  formoterol (PERFOROMIST) 20 MCG/2ML nebulizer solution Take 2 mLs (20 mcg total) by nebulization 2 (two) times daily. 02/24/18   [name], MD  furosemide (LASIX) 20 MG tablet Take 1 tablet (20 mg total) by mouth daily. 04/27/18   Copland, Frederico Hamman, MD  gabapentin (NEURONTIN) 300 MG capsule Take 1 capsule (300 mg total) by mouth at bedtime. 03/23/18   Copland, Frederico Hamman, MD  ipratropium-albuterol (DUONEB) 0.5-2.5 (3) MG/3ML SOLN Take 3 mLs by nebulization every 4 (four) hours as needed. 03/11/18   [provider]  LORazepam (ATIVAN) 0.5 MG tablet Take 1 tablet (0.5 mg total) by mouth every 6 (six)  hours as needed for anxiety. 04/06/18   Copland, Frederico Hamman, MD  megestrol (MEGACE) 40 MG tablet Take 1 tablet (40 mg total) by mouth daily. 04/26/17   Lloyd Huger, MD  Multiple Vitamin (MULTIVITAMIN WITH MINERALS) TABS tablet Take 1 tablet by mouth daily. 03/07/18   Vaughan Basta, MD  nystatin (MYCOSTATIN) 100000 UNIT/ML suspension Use as directed 5 mLs (500,000 Units total) in the mouth or throat 4 (four) times daily. 03/23/18   Copland, Frederico Hamman, MD  omeprazole (PRILOSEC) 20 MG capsule Take 20 mg by mouth daily.    [provider]  oxymetazoline (AFRIN) 0.05 % nasal spray Place 1 spray into both nostrils daily as needed for congestion.    [provider]  predniSONE (DELTASONE) 10 MG tablet Take 1 tablet (10 mg total) by mouth daily with breakfast. 04/29/18   Copland, Frederico Hamman, MD  protein supplement (RESOURCE BENEPROTEIN) POWD Take 12-18 g by mouth 3 (three) times daily with meals. 03/06/18   Vaughan Basta, MD  pseudoephedrine (SUDAFED) 30 MG tablet Take 30 mg by mouth daily as needed for congestion.     [provider]  tiZANidine (ZANAFLEX) 4 MG tablet Take 1 tablet (4 mg total) by mouth Nightly. 03/23/18    Copland, Frederico Hamman, MD  vitamin C (VITAMIN C) 250 MG tablet Take 1 tablet (250 mg total) by mouth 2 (two) times daily. 03/06/18   Vaughan Basta, MD      VITAL SIGNS:  Blood pressure 122/87, pulse (!) 115, temperature 97.6 F (36.4 C), temperature source Oral, resp. rate 14, height _0  (1.803 m), weight 68.4 kg, SpO2 100 %.  PHYSICAL EXAMINATION:  GENERAL:  67 y.o.-year-old patient lying in the bed with no acute distress.  EYES: Pupils equal, round, reactive to light and accommodation. No scleral icterus. Extraocular muscles intact.  HEENT: Head atraumatic, normocephalic. Oropharynx and nasopharynx clear.  NECK:  Supple, no jugular venous distention. No thyroid enlargement, no tenderness.  LUNGS: Moderately diminished breath sounds bilaterally, diffuse wheezing, positive rales,rhonchi or crepitation. No use of accessory muscles of respiration.  CARDIOVASCULAR: S1, S2 normal. No murmurs, rubs, or gallops.  ABDOMEN: Soft, nontender, nondistended. Bowel sounds present. No organomegaly or mass.  EXTREMITIES: No pedal edema, cyanosis, or clubbing.  NEUROLOGIC: Cranial nerves II through XII are intact. Muscle strength global weakness in all extremities. Sensation intact. Gait not checked.  PSYCHIATRIC: The patient is alert and oriented x 3.  SKIN: No obvious rash, lesion, or ulcer.   LABORATORY PANEL:   CBC Recent Labs  Lab 05/01/18 1024  WBC 12.9*  HGB 13.6  HCT 39.7*  PLT 238   ------------------------------------------------------------------------------------------------------------------  Chemistries  Recent Labs  Lab 05/01/18 1024  NA 134*  K 4.4  CL 92*  CO2 32  GLUCOSE 136*  BUN 16  CREATININE 0.73  CALCIUM 9.1  AST 56*  ALT 70*  ALKPHOS 78  BILITOT 1.0   ------------------------------------------------------------------------------------------------------------------  Cardiac Enzymes Recent Labs  Lab 05/01/18 1024  TROPONINI 0.04*    ------------------------------------------------------------------------------------------------------------------  RADIOLOGY:  Dg Chest Portable 1 View  Result Date: 05/01/2018 CLINICAL DATA:  Acute shortness of breath for 2-3 days. EXAM: PORTABLE CHEST 1 VIEW COMPARISON:  04/24/2018 chest radiograph, 05/13/2017 chest CT and prior studies FINDINGS: UPPER limits normal heart size and median sternotomy again noted. Diffuse interstitial opacities are again noted. No definite new pulmonary opacities noted. No pleural effusion, pneumothorax or acute bony abnormalities identified. IMPRESSION: No evidence of acute cardiopulmonary disease. Chronic interstitial lung disease. Electronically Signed   By: Dellis Filbert  Hu M.D.   On: 05/01/2018 10:44    EKG:   Orders placed or performed during the hospital encounter of 05/01/18  . ED EKG  . 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  Outpatient follow-up with Dr. Ashby Dawes after discharge Prophylactic antibiotics   Elevated troponin is from demand ischemia but continue close monitoring of the troponins  History of coronary artery disease Monitor serial troponins Resume home medications  Failure to thrive with severe COPD Palliative care consult placed  regarding goals of care  All the records are reviewed and case discussed with ED provider. Management plans discussed with the patient, family and they are in agreement.  CODE STATUS: FC   TOTAL TIME TAKING CARE OF THIS PATIENT: 43 minutes.   Note: This dictation was prepared with Dragon dictation along with smaller phrase technology. Any transcriptional errors that result from this process are unintentional.  Nicholes Mango M.D on 05/01/2018 at 3:08 PM  Between 7am to 6pm - Pager - [phone]  After 6pm go to [email or web address] - password [email or web address] [facility]  Office  [phone]  CC: Primary care physician; Owens Loffler, MD

## 2018-05-02 ENCOUNTER — Ambulatory Visit: Payer: Medicare HMO | Admitting: Nurse Practitioner

## 2018-05-02 ENCOUNTER — Ambulatory Visit: Payer: Medicare HMO | Admitting: Internal Medicine

## 2018-05-02 ENCOUNTER — Encounter: Payer: Self-pay | Admitting: Nurse Practitioner

## 2018-05-02 DIAGNOSIS — I5023 Acute on chronic systolic (congestive) heart failure: Secondary | ICD-10-CM

## 2018-05-02 DIAGNOSIS — J441 Chronic obstructive pulmonary disease with (acute) exacerbation: Secondary | ICD-10-CM

## 2018-05-02 DIAGNOSIS — J9611 Chronic respiratory failure with hypoxia: Secondary | ICD-10-CM

## 2018-05-02 DIAGNOSIS — Z7189 Other specified counseling: Secondary | ICD-10-CM

## 2018-05-02 DIAGNOSIS — Z515 Encounter for palliative care: Secondary | ICD-10-CM

## 2018-05-02 LAB — BASIC METABOLIC PANEL
Anion gap: 9 (ref 5–15)
BUN: 22 mg/dL (ref 8–23)
CHLORIDE: 94 mmol/L — AB (ref 98–111)
CO2: 32 mmol/L (ref 22–32)
Calcium: 8.7 mg/dL — ABNORMAL LOW (ref 8.9–10.3)
Creatinine, Ser: 0.75 mg/dL (ref 0.61–1.24)
GFR calc Af Amer: >60 mL/min (ref >60)
GFR calc non Af Amer: >60 mL/min (ref >60)
Glucose, Bld: 189 mg/dL — ABNORMAL HIGH (ref 70–99)
POTASSIUM: 3.9 mmol/L (ref 3.5–5.1)
SODIUM: 135 mmol/L (ref 135–145)

## 2018-05-02 MED ORDER — ASPIRIN 81 MG PO CHEW
81.0000 mg | CHEWABLE_TABLET | ORAL | Status: AC
  Administered 2018-05-03: 81 mg via ORAL
  Filled 2018-05-02: qty 1

## 2018-05-02 MED ORDER — SODIUM CHLORIDE 0.9 % IV SOLN
INTRAVENOUS | Status: DC
  Administered 2018-05-03: 06:00:00 via INTRAVENOUS

## 2018-05-02 MED ORDER — ATORVASTATIN CALCIUM 20 MG PO TABS
40.0000 mg | ORAL_TABLET | Freq: Every day | ORAL | Status: DC
  Administered 2018-05-02 – 2018-05-07 (×6): 40 mg via ORAL
  Filled 2018-05-02 (×6): qty 2

## 2018-05-02 MED ORDER — SODIUM CHLORIDE 0.9% FLUSH
3.0000 mL | Freq: Two times a day (BID) | INTRAVENOUS | Status: DC
  Administered 2018-05-02 (×2): 3 mL via INTRAVENOUS

## 2018-05-02 MED ORDER — ASPIRIN 81 MG PO CHEW
81.0000 mg | CHEWABLE_TABLET | Freq: Every day | ORAL | Status: DC
  Administered 2018-05-02 – 2018-05-08 (×6): 81 mg via ORAL
  Filled 2018-05-02 (×6): qty 1

## 2018-05-02 MED ORDER — SODIUM CHLORIDE 0.9 % IV SOLN: 250.0000 mL | INTRAVENOUS | Status: DC | PRN

## 2018-05-02 MED ORDER — SODIUM CHLORIDE 0.9% FLUSH: 3.0000 mL | INTRAVENOUS | Status: DC | PRN

## 2018-05-02 NOTE — Consult Note (Signed)
[facility] Consultation      Date: 05/02/2018,   MRN# [id number] NICOLAUS ANDEL 04/24/1951 Code Status:     Code Status Orders  (From admission, onward)         Start     Ordered   05/01/18 1255  Full code  Continuous     05/01/18 1254        Code Status History    Date Active Date Inactive Code Status Order ID Comments User Context   03/24/2018 1947 Watson 1340 Full Code [id number]  Saundra Shelling, MD Inpatient   03/03/2018 0328 03/06/2018 1716 Full Code [id number]  Harrie Foreman, MD Inpatient     Hosp day:@LENGTHOFSTAYDAYS @ Referring MD: @ATDPROV @     PCP:      AdmissionWeight: 68.4 kg                 CurrentWeight: 65.6 kg LATAVION HALLS is a 67 y.o. old male seen in consultation for resp failure  at the request of Gouru,A.     CHIEF COMPLAINT:   sob   HISTORY OF PRESENT ILLNESS   Mr. Hopping is a 67 year old patient with history of interstitial lung disease, respiratory bronchiolitis.  He is status post bronchoscopy in October 2018 by Dr. Mortimer Fries, who he follows with in the clinic. He is now admitted to the hospital thought to be due to exacerbation of COPD and congestive heart failure.  He has a known history of severe systolic congestive heart failure with an EF around 15 to 20%.  Presented to the hospital with worsening shortness of breath associated with increasing lower extremity edema, usually uses 2 to 3 L of oxygen at home.  Currently patient is on nasal cannula at 4 L with an ox saturation of 98%.    MEDICATIONS    Home Medication:    Current Medication:  Current Facility-Administered Medications:  .  0.9 %  sodium chloride infusion, 250 mL, Intravenous, PRN, Gouru, Aruna, MD .  acetaminophen (TYLENOL) tablet 1,000 mg, 1,000 mg, Oral, Daily PRN, Gouru, Aruna, MD .  acetaminophen (TYLENOL) tablet 650 mg, 650 mg, Oral, Q4H PRN, Gouru, Aruna, MD .  albuterol (PROVENTIL) (2.5 MG/3ML) 0.083% nebulizer solution 2.5 mg, 2.5  mg, Nebulization, Q4H PRN, Gouru, Aruna, MD, 2.5 mg at 05/01/18 1810 .  arformoterol (BROVANA) nebulizer solution 15 mcg, 15 mcg, Nebulization, Q12H, Gouru, Aruna, MD, 15 mcg at 05/02/18 0712 .  aspirin chewable tablet 81 mg, 81 mg, Oral, Daily, Theora Gianotti, NP, 81 mg at 05/02/18 1013 .  atorvastatin (LIPITOR) tablet 40 mg, 40 mg, Oral, q1800, Theora Gianotti, NP .  azithromycin [facility]) tablet 500 mg, 500 mg, Oral, Daily, Rosine Door, MD, 500 mg at 05/02/18 1012 .  budesonide (PULMICORT) nebulizer solution 0.5 mg, 0.5 mg, Nebulization, BID, Gouru, Aruna, MD, 0.5 mg at 05/02/18 8101 .  carvedilol (COREG) tablet 12.5 mg, 12.5 mg, Oral, BID WC, Gouru, Aruna, MD, 12.5 mg at 05/02/18 0904 .  cholecalciferol (VITAMIN D) tablet 2,000 Units, 2,000 Units, Oral, QPM, Gouru, Aruna, MD, 2,000 Units at 05/01/18 1829 .  enoxaparin (LOVENOX) injection 40 mg, 40 mg, Subcutaneous, Q24H, Gouru, Aruna, MD, 40 mg at 05/01/18 1829 .  furosemide (LASIX) injection 20 mg, 20 mg, Intravenous, Q12H, Gouru, Aruna, MD, 20 mg at 05/02/18 0104 .  gabapentin (NEURONTIN) capsule 300 mg, 300 mg, Oral, QHS, Gouru, Aruna, MD, 300 mg at 05/01/18 2307 .  ipratropium-albuterol (DUONEB) 0.5-2.5 (3) MG/3ML nebulizer solution 3 mL, 3 mL,  Nebulization, Q6H, Gouru, Aruna, MD, 3 mL at 05/02/18 0713 .  LORazepam (ATIVAN) tablet 0.5 mg, 0.5 mg, Oral, Q6H PRN, Gouru, Aruna, MD, 0.5 mg at 05/02/18 0104 .  megestrol (MEGACE) tablet 40 mg, 40 mg, Oral, Daily, Gouru, Aruna, MD, 40 mg at 05/02/18 0905 .  methylPREDNISolone sodium succinate (SOLU-MEDROL) 125 mg/2 mL injection 60 mg, 60 mg, Intravenous, Q8H, Gouru, Aruna, MD, 60 mg at 05/02/18 2694 .  multivitamin with minerals tablet 1 tablet, 1 tablet, Oral, Daily, Gouru, Aruna, MD, 1 tablet at 05/02/18 0904 .  nystatin (MYCOSTATIN) 100000 UNIT/ML suspension 500,000 Units, 5 mL, Mouth/Throat, QID, Gouru, Aruna, MD, 500,000 Units at 05/02/18 0905 .  ondansetron (ZOFRAN)  injection 4 mg, 4 mg, Intravenous, Q6H PRN, Gouru, Aruna, MD .  oxymetazoline (AFRIN) 0.05 % nasal spray 1 spray, 1 spray, Each Nare, Daily PRN, Gouru, Aruna, MD .  pantoprazole (PROTONIX) EC tablet 40 mg, 40 mg, Oral, QAC breakfast, Gouru, Aruna, MD, 40 mg at 05/02/18 0904 .  potassium chloride SA (K-DUR,KLOR-CON) CR tablet 20 mEq, 20 mEq, Oral, Daily, Nahser, Wonda Cheng, MD, 20 mEq at 05/02/18 0904 .  protein supplement (RESOURCE BENEPROTEIN) powder 12-18 g, 2-3 scoop, Oral, TID WC, Gouru, Aruna, MD .  pseudoephedrine (SUDAFED) tablet 30 mg, 30 mg, Oral, Daily PRN, Gouru, Aruna, MD .  sodium chloride flush (NS) 0.9 % injection 3 mL, 3 mL, Intravenous, Q12H, Gouru, Aruna, MD, 3 mL at 05/02/18 0906 .  sodium chloride flush (NS) 0.9 % injection 3 mL, 3 mL, Intravenous, PRN, Gouru, Aruna, MD .  tiZANidine (ZANAFLEX) tablet 4 mg, 4 mg, Oral, Nightly, Gouru, Aruna, MD, 4 mg at 05/01/18 2307 .  vitamin C (ASCORBIC ACID) tablet 250 mg, 250 mg, Oral, BID, Gouru, Aruna, MD, 250 mg at 05/02/18 8546    ALLERGIES   Patient has no known allergies.     REVIEW OF SYSTEMS   Review of Systems  Constitutional: Negative for diaphoresis and malaise/fatigue.  HENT: Negative for hearing loss and tinnitus.   Eyes: Negative for blurred vision and double vision.  Respiratory: Positive for cough and shortness of breath.   Cardiovascular: Positive for orthopnea and leg swelling.  Gastrointestinal: Negative for heartburn and nausea.  Genitourinary: Negative for dysuria and urgency.  Musculoskeletal: Negative for myalgias and neck pain.  Skin: Negative for itching and rash.    Physical Exam  Constitutional: He is oriented to person, place, and time and well-developed, well-nourished, and in no distress.  HENT:  Head: Normocephalic.  Right Ear: External ear normal.  Eyes: Pupils are equal, round, and reactive to light. Conjunctivae are normal.  Neck: Normal range of motion. Neck supple.  Cardiovascular:  Normal rate. Exam reveals no friction rub.  Pulmonary/Chest: He has wheezes. He has rales.  Abdominal: He exhibits distension. There is no tenderness. There is no rebound.  Musculoskeletal: Normal range of motion.  Neurological: He is alert and oriented to person, place, and time.  Skin: Skin is warm and dry.    VS: BP 107/74 (BP Location: Right Arm)   Pulse 85   Temp (!) 97.4 F (36.3 C) (Oral)   Resp 18   Ht 5\' 11"  (1.803 m)   Wt 65.6 kg   SpO2 98%   BMI 20.17 kg/m      PHYSICAL EXAM  Physical Examination:     IMAGING    Dg Chest 2 View  Result Date: 04/24/2018 CLINICAL DATA:  Shortness of breath 2 months worsening over the past few days with  cough and congestion. Oxygen dependent. EXAM: CHEST - 2 VIEW COMPARISON:  03/24/2018 and 02/27/2018 FINDINGS: Lungs are adequately inflated demonstrate diffuse bilateral increased interstitial markings without change. No consolidation or effusion. Cardiomediastinal silhouette and remainder of the exam is unchanged. IMPRESSION: Stable chronic diffuse interstitial disease.  No acute findings. Electronically Signed   By: Marin Olp M.D.   On: 04/24/2018 15:22   Dg Chest Portable 1 View  Result Date: 05/01/2018 CLINICAL DATA:  Acute shortness of breath for 2-3 days. EXAM: PORTABLE CHEST 1 VIEW COMPARISON:  04/24/2018 chest radiograph, 05/13/2017 chest CT and prior studies FINDINGS: UPPER limits normal heart size and median sternotomy again noted. Diffuse interstitial opacities are again noted. No definite new pulmonary opacities noted. No pleural effusion, pneumothorax or acute bony abnormalities identified. IMPRESSION: No evidence of acute cardiopulmonary disease. Chronic interstitial lung disease. Electronically Signed   By: Margarette Canada M.D.   On: 05/01/2018 10:44       ASSESSMENT/PLAN   1) acute on chronic hypoxic respiratory failure, multifactorial secondary to COPD exacerbation with acute pulmonary edema. 2) HFrEF management as  per card. 3) acute exacerbation of COPD. 4) respiratory bronchiolitis, interstitial lung disease, thought to be secondary to smoking.  Patient is status post bronchoscopy in October 2018, he is no longer smoking. 5. H/o indolent CMML.   -Recommend continued diuresis as tolerated. -Continue nebulizers. - Wean down/off steroids. - Wean down oxygen as tolerated. - Agree with palliative care consultation and continued discussions around [place]. - Continue nebulizers -Recommend continue diuresis as tolerated.    Laverle Hobby, MD  05/02/2018 11:50 AM [name]

## 2018-05-02 NOTE — Progress Notes (Signed)
[facility] [place] NAME: Tony Watson    MR#:  [id number]  DATE OF BIRTH:  30-Dec-1950  SUBJECTIVE:   Patient presented to the hospital due to shortness of breath and noted to be in acute on chronic respiratory failure secondary to CHF/COPD exacerbation.  Patient apparently had run out of his Lasix. Patient is now being diuresed with IV Lasix and responding to it and feels better.  REVIEW OF SYSTEMS:    Review of Systems  Constitutional: Negative for chills and fever.  HENT: Negative for congestion and tinnitus.   Eyes: Negative for blurred vision and double vision.  Respiratory: Positive for shortness of breath. Negative for cough and wheezing.   Cardiovascular: Positive for orthopnea, leg swelling and PND. Negative for chest pain.  Gastrointestinal: Negative for abdominal pain, diarrhea, nausea and vomiting.  Genitourinary: Negative for dysuria and hematuria.  Neurological: Negative for dizziness, sensory change and focal weakness.  All other systems reviewed and are negative.   Nutrition: Heart Healthy Tolerating Diet: Yes Tolerating PT: Ambulatory  DRUG ALLERGIES:  No Known Allergies  VITALS:  Blood pressure 107/74, pulse 85, temperature (!) 97.4 F (36.3 C), temperature source Oral, resp. rate 18, height 5\' 11"  (1.803 m), weight 65.6 kg, SpO2 98 %.  PHYSICAL EXAMINATION:   Physical Exam  GENERAL:  67 y.o.-year-old patient lying in bed in no acute distress.  EYES: Pupils equal, round, reactive to light and accommodation. No scleral icterus. Extraocular muscles intact.  HEENT: Head atraumatic, normocephalic. Oropharynx and nasopharynx clear.  NECK:  Supple, + jugular venous distention. No thyroid enlargement, no tenderness.  LUNGS: Normal breath sounds bilaterally, no wheezing, bibasilar rales, rhonchi. No use of accessory muscles of respiration.  CARDIOVASCULAR: S1, S2 normal. No murmurs, rubs, or gallops.  ABDOMEN: Soft,  nontender, nondistended. Bowel sounds present. No organomegaly or mass.  EXTREMITIES: No cyanosis, clubbing, +1-2 edema b/l.    NEUROLOGIC: Cranial nerves II through XII are intact. No focal Motor or sensory deficits b/l. Globally weak.    PSYCHIATRIC: The patient is alert and oriented x 3.  SKIN: No obvious rash, lesion, or ulcer.    LABORATORY PANEL:   CBC Recent Labs  Lab 05/01/18 1024  WBC 12.9*  HGB 13.6  HCT 39.7*  PLT 238   ------------------------------------------------------------------------------------------------------------------  Chemistries  Recent Labs  Lab 05/01/18 1024 05/02/18 0430  NA 134* 135  K 4.4 3.9  CL 92* 94*  CO2 32 32  GLUCOSE 136* 189*  BUN 16 22  CREATININE 0.73 0.75  CALCIUM 9.1 8.7*  AST 56*  --   ALT 70*  --   ALKPHOS 78  --   BILITOT 1.0  --    ------------------------------------------------------------------------------------------------------------------  Cardiac Enzymes Recent Labs  Lab 05/01/18 1024  TROPONINI 0.04*   ------------------------------------------------------------------------------------------------------------------  RADIOLOGY:  Dg Chest Portable 1 View  Result Date: 05/01/2018 CLINICAL DATA:  Acute shortness of breath for 2-3 days. EXAM: PORTABLE CHEST 1 VIEW COMPARISON:  04/24/2018 chest radiograph, 05/13/2017 chest CT and prior studies FINDINGS: UPPER limits normal heart size and median sternotomy again noted. Diffuse interstitial opacities are again noted. No definite new pulmonary opacities noted. No pleural effusion, pneumothorax or acute bony abnormalities identified. IMPRESSION: No evidence of acute cardiopulmonary disease. Chronic interstitial lung disease. Electronically Signed   By: Margarette Canada M.D.   On: 05/01/2018 10:44     ASSESSMENT AND PLAN:   67 yo male with past medical history of diabetes, hypertension, coronary artery disease  status post stent placement, combined systolic/diastolic  CHF, hypertension, hyperlipidemia, medical noncompliance, history of previous CVA who presented to the hospital due to shortness of breath.  1.  Acute on chronic respiratory failure with hypoxia- secondary to CHF/COPD exacerbation. - Continue diuresis with IV Lasix, follow I's and O's and daily weights.  Continue IV steroids, scheduled duo nebs, Pulmicort nebs for the COPD exacerbation.  Wean off oxygen as tolerated.  2.  CHF- acute on chronic combined diastolic/systolic dysfunction. -Patient had run out of his Lasix.  Continue diuresis with IV Lasix, patient is responding and feeling better.  Follow I's and O's and daily weights. -Continue carvedilol, Lasix  3.  COPD exacerbation-secondary to ongoing tobacco abuse.  Chest x-ray showing no evidence of acute pneumonia. -Continue IV steroids, scheduled duo nebs, Pulmicort nebs. Cont. Empiric Zithromax. Cont. Brovana  4.  Ischemic cardiomyopathy- patient has a previous ejection fraction of 45%.  Repeat echo during this hospitalization showing significant reduction in ejection fraction of 15 to 20%.  Patient was supposed to have a outpatient functional study done but patient canceled his appointment. - Cardiology plans on doing a right and left heart catheterization tomorrow.  Continue further care as per cardiology. -Continue aspirin, carvedilol and atorvastatin.  5.  GERD-continue Protonix.   All the records are reviewed and case discussed with Care Management/Social Worker. Management plans discussed with the patient, family and they are in agreement.  CODE STATUS: Full code  DVT Prophylaxis: Lovenox  TOTAL TIME TAKING CARE OF THIS PATIENT: 30 minutes.   POSSIBLE D/C IN 2-3 DAYS, DEPENDING ON CLINICAL CONDITION.   Henreitta Leber M.D on 05/02/2018 at 3:22 PM  Between 7am to 6pm - Pager - [phone]  After 6pm go to [email or web address] - Proofreader  [facility]  Office  [phone]  CC: Primary  care physician; Owens Loffler, MD

## 2018-05-02 NOTE — Progress Notes (Signed)
Pt refusing bed alarm despite teaching. Insists he will call for assistance. Will continue to monitor and re teach.

## 2018-05-02 NOTE — H&P (View-Only) (Signed)
Progress Note  Patient Name: Tony Watson Date of Encounter: 05/02/2018  Primary Cardiologist: Kathlyn Sacramento, MD  Subjective   Breathing much better.  Not quite at baseline.  Good response to lasix.  Says he's been out of lasix @ home for about 2 wks.  Inpatient Medications    Scheduled Meds: . arformoterol  15 mcg Nebulization Q12H  . azithromycin  500 mg Oral Daily  . budesonide  0.5 mg Nebulization BID  . carvedilol  12.5 mg Oral BID WC  . cholecalciferol  2,000 Units Oral QPM  . enoxaparin (LOVENOX) injection  40 mg Subcutaneous Q24H  . furosemide  20 mg Intravenous Q12H  . gabapentin  300 mg Oral QHS  . ipratropium-albuterol  3 mL Nebulization Q6H  . megestrol  40 mg Oral Daily  . methylPREDNISolone (SOLU-MEDROL) injection  60 mg Intravenous Q8H  . multivitamin with minerals  1 tablet Oral Daily  . nystatin  5 mL Mouth/Throat QID  . pantoprazole  40 mg Oral QAC breakfast  . potassium chloride  20 mEq Oral Daily  . protein supplement  2-3 scoop Oral TID WC  . sodium chloride flush  3 mL Intravenous Q12H  . tiZANidine  4 mg Oral Nightly  . ascorbic acid  250 mg Oral BID   Continuous Infusions: . sodium chloride     PRN Meds: sodium chloride, acetaminophen, acetaminophen, albuterol, LORazepam, ondansetron (ZOFRAN) IV, oxymetazoline, pseudoephedrine, sodium chloride flush   Vital Signs    Vitals:   05/02/18 0333 05/02/18 0714 05/02/18 0715 05/02/18 0738  BP: 94/70 106/71    Pulse: 71 78    Resp: 18 18    Temp: 98.1 F (36.7 C)   (!) 97.4 F (36.3 C)  TempSrc:    Oral  SpO2: 97% 100% 98%   Weight: 65.6 kg     Height:        Intake/Output Summary (Last 24 hours) at 05/02/2018 0837 Last data filed at 05/02/2018 0817 Gross per 24 hour  Intake -  Output 3100 ml  Net -3100 ml   Filed Weights   05/01/18 1047 05/02/18 0333  Weight: 68.4 kg 65.6 kg    Physical Exam   GEN: frail/thin, in no acute distress.  HEENT: Grossly normal.  Neck: Supple, no  JVD, carotid bruits, or masses. Cardiac: RRR, distant, no murmurs, rubs, or gallops. No clubbing, cyanosis, 1+ bilat ankle edema.  Radials/DP/PT 2+ and equal bilaterally.  Respiratory:  Respirations regular and unlabored, coarse breath sounds w/ insp and exp wheezing throughout. GI: Soft, nontender, nondistended, BS + x 4. MS: no deformity or atrophy. Skin: warm and dry, no rash. Neuro:  Strength and sensation are intact. Psych: AAOx3.  Normal affect.  Labs    Chemistry Recent Labs  Lab 05/01/18 1024 05/02/18 0430  NA 134* 135  K 4.4 3.9  CL 92* 94*  CO2 32 32  GLUCOSE 136* 189*  BUN 16 22  CREATININE 0.73 0.75  CALCIUM 9.1 8.7*  PROT 7.8  --   ALBUMIN 3.8  --   AST 56*  --   ALT 70*  --   ALKPHOS 78  --   BILITOT 1.0  --   GFRNONAA >60 >60  GFRAA >60 >60  ANIONGAP 10 9     Hematology Recent Labs  Lab 05/01/18 1024  WBC 12.9*  RBC 4.06*  HGB 13.6  HCT 39.7*  MCV 98.0  MCH 33.5  MCHC 34.2  RDW 17.0*  PLT 238  Cardiac Enzymes Recent Labs  Lab 05/01/18 1024  TROPONINI 0.04*     BNP Recent Labs  Lab 05/01/18 1024  BNP 1,279.0*      Radiology    Dg Chest Portable 1 View  Result Date: 05/01/2018 CLINICAL DATA:  Acute shortness of breath for 2-3 days. EXAM: PORTABLE CHEST 1 VIEW COMPARISON:  04/24/2018 chest radiograph, 05/13/2017 chest CT and prior studies FINDINGS: UPPER limits normal heart size and median sternotomy again noted. Diffuse interstitial opacities are again noted. No definite new pulmonary opacities noted. No pleural effusion, pneumothorax or acute bony abnormalities identified. IMPRESSION: No evidence of acute cardiopulmonary disease. Chronic interstitial lung disease. Electronically Signed   By: Margarette Canada M.D.   On: 05/01/2018 10:44    Telemetry    RSR, brief run of SVT - Personally Reviewed  Cardiac Studies   2D Echocardiogram 7.25.2019  Study Conclusions   - Left ventricle: The cavity size was mildly dilated. Systolic    function was severely reduced. The estimated ejection fraction   was in the range of 25% to 30%. Severe diffuse hypokinesis.   Anterior wall motion best preserved. The study is not technically   sufficient to allow evaluation of LV diastolic function. - Mitral valve: There was mild regurgitation. - Left atrium: The atrium was moderately dilated. - Right ventricle: Systolic function was mildly reduced. - Pulmonary arteries: Systolic pressure could not be accurately   estimated. - Inferior vena cava: The vessel was dilated. The respirophasic   diameter changes were blunted (< 50%), consistent with elevated   central venous pressure.   Impressions:   - Normal study. _____________    Patient Profile     67 y.o. male w/ a h/o CAD, combined CHF, DM, HTN, HL, and severe COPD, who was admitted 9/22 w/ resp failure.  Assessment & Plan    1.  Acute multifactorial respiratory failure:  In the setting of severe COPD and moderate volume overload.  Abx, nebs, and steroids per IM.  Cont diuresis today.  2.  AECOPD:  See #1.   3.  Acute on chronic combined systolic and diastolic CHF:  EF 90-24% by echo in July.  He does not weigh himself at home and has been out of lasix for about 2 wks.  IV lasix started yesterday.  Minu 2.8L overnight (though intake not recorded).  Wt down 2.8Kg from yesterday morning to yesterday evening. Wt pending this AM. He says breathing improved significantly with diuresis.  Still with ankle edema.  He says that he does not normally have edema @ home.  Renal fxn stable.  Cont IV lasix today with plan to resume lasix 20 PO daily tomorrow (prior home dose).  May need 40 daily if he has prolonged steroid requirement. Cont  blocker.  No acei/arb/arni/spiro as BP trends in 90's to low 100's.  We discussed the importance of daily weights, sodium restriction, medication compliance, and symptom reporting and he verbalizes understanding.    4.  CAD/Elevated troponin:  Minimal trop  elevation in the setting of above (0.04 x 1 - not trended).  No chest pain.  Prior inf infarct in 06/1998 w/ PCI/BMS to RCA.  Patent stent on 2004 cath in [place]. No ischemia on MV in 2013.  Cont  blocker.  Add asa/statin (not listed in home meds).  5.  HL:  Add statin given CAD/DM hx.  LDL was 79 in 2017.  Will need outpt f/u.  6.  DMII:  Diet controlled.  A1c 6.3  in July (prev as high as 7.2 in 10/2013).    7.  PSVT:  16 beats of svt on tele.  Asymptomatic.  Cont  blocker. Lytes ok.  Signed, Murray Hodgkins, NP  05/02/2018, 8:37 AM    For questions or updates, please contact   Please consult [email or web address] for contact info under Cardiology/STEMI.

## 2018-05-02 NOTE — Progress Notes (Signed)
Palliative Note:    Spoke with Oris Drone, RN. She informed me when she went in to place DNR bracelet on patient he was reluctant and mentioned that while he was in the hospital he would want everything done. He wanted to wait until his wife arrived to the hospital. At patient's request per RN will change code status back to full code and if wife arrives and they do decide at that time to transition back to DNR/DNI will change orders at that time based on their wishes and comfort.   I did spend a great deal of time discussing goals of care with patient earlier in regards to his wishes and he was somewhat anxious and discussed experiences of his mother-in-law and sister-in-law both requiring CPR etc. He stated he did not want to go through what they had. He was somewhat anxious and tearful during our conversation and stated he may would be ok with CPR and brief trial in regards to intubation/ventilation but no more than an hr or so, definitely no more than a day. When asked about recommendations we discussed in details what a code situation would look like given his current illness and co-morbidities. Patient at that time verbalized he would not want to undergo heroic measures but also wanted his wife's input, who was not present. I contacted wife via telephone and spoke with her. She verbalized her wishes for him to also be a DNR/DNI at that time. Wife questioned DNR status during cardiac cath on tomorrow. Discussed that generally during medical procedures DNR is placed on hold and patient is considered a Full code. If complications occurred the provider would have a conversation with family at that time on how to proceed with interventions. Wife verbalized understanding. Patient has a DNR in the home and both he and his wife verbalized if he was at home and had an event or found unresponsive to allow him to pass naturally without intervention. Wife stated she would be up to visit later on today. Wife also  expressed wishes for outpatient hospice to be arranged upon discharge home. Depending on cardiac interventions, patient may not be a candidate for hospice services, however, at this time he is and will proceed with referral, if anything changes wife is aware that Palliative services are available.   Oris Drone, RN to call me once wife arrives and we can further discuss their wishes if they choose to with both of them being present. Otherwise, patient will remain a full code.   NO CHARGE   Alda Lea, AGPCNP-BC Palliative Medicine Team  Phone: [phone] Fax: [phone] Pager: [phone] Amion: Delane Ginger. Cousar

## 2018-05-02 NOTE — Progress Notes (Addendum)
Progress Note  Patient Name: Tony Watson Date of Encounter: 05/02/2018  Primary Cardiologist: Kathlyn Sacramento, MD  Subjective   Breathing much better.  Not quite at baseline.  Good response to lasix.  Says he's been out of lasix @ home for about 2 wks.  Inpatient Medications    Scheduled Meds: . arformoterol  15 mcg Nebulization Q12H  . azithromycin  500 mg Oral Daily  . budesonide  0.5 mg Nebulization BID  . carvedilol  12.5 mg Oral BID WC  . cholecalciferol  2,000 Units Oral QPM  . enoxaparin (LOVENOX) injection  40 mg Subcutaneous Q24H  . furosemide  20 mg Intravenous Q12H  . gabapentin  300 mg Oral QHS  . ipratropium-albuterol  3 mL Nebulization Q6H  . megestrol  40 mg Oral Daily  . methylPREDNISolone (SOLU-MEDROL) injection  60 mg Intravenous Q8H  . multivitamin with minerals  1 tablet Oral Daily  . nystatin  5 mL Mouth/Throat QID  . pantoprazole  40 mg Oral QAC breakfast  . potassium chloride  20 mEq Oral Daily  . protein supplement  2-3 scoop Oral TID WC  . sodium chloride flush  3 mL Intravenous Q12H  . tiZANidine  4 mg Oral Nightly  . ascorbic acid  250 mg Oral BID   Continuous Infusions: . sodium chloride     PRN Meds: sodium chloride, acetaminophen, acetaminophen, albuterol, LORazepam, ondansetron (ZOFRAN) IV, oxymetazoline, pseudoephedrine, sodium chloride flush   Vital Signs    Vitals:   05/02/18 0333 05/02/18 0714 05/02/18 0715 05/02/18 0738  BP: 94/70 106/71    Pulse: 71 78    Resp: 18 18    Temp: 98.1 F (36.7 C)   (!) 97.4 F (36.3 C)  TempSrc:    Oral  SpO2: 97% 100% 98%   Weight: 65.6 kg     Height:        Intake/Output Summary (Last 24 hours) at 05/02/2018 0837 Last data filed at 05/02/2018 0817 Gross per 24 hour  Intake -  Output 3100 ml  Net -3100 ml   Filed Weights   05/01/18 1047 05/02/18 0333  Weight: 68.4 kg 65.6 kg    Physical Exam   GEN: frail/thin, in no acute distress.  HEENT: Grossly normal.  Neck: Supple, no  JVD, carotid bruits, or masses. Cardiac: RRR, distant, no murmurs, rubs, or gallops. No clubbing, cyanosis, 1+ bilat ankle edema.  Radials/DP/PT 2+ and equal bilaterally.  Respiratory:  Respirations regular and unlabored, coarse breath sounds w/ insp and exp wheezing throughout. GI: Soft, nontender, nondistended, BS + x 4. MS: no deformity or atrophy. Skin: warm and dry, no rash. Neuro:  Strength and sensation are intact. Psych: AAOx3.  Normal affect.  Labs    Chemistry Recent Labs  Lab 05/01/18 1024 05/02/18 0430  NA 134* 135  K 4.4 3.9  CL 92* 94*  CO2 32 32  GLUCOSE 136* 189*  BUN 16 22  CREATININE 0.73 0.75  CALCIUM 9.1 8.7*  PROT 7.8  --   ALBUMIN 3.8  --   AST 56*  --   ALT 70*  --   ALKPHOS 78  --   BILITOT 1.0  --   GFRNONAA >60 >60  GFRAA >60 >60  ANIONGAP 10 9     Hematology Recent Labs  Lab 05/01/18 1024  WBC 12.9*  RBC 4.06*  HGB 13.6  HCT 39.7*  MCV 98.0  MCH 33.5  MCHC 34.2  RDW 17.0*  PLT 238  Cardiac Enzymes Recent Labs  Lab 05/01/18 1024  TROPONINI 0.04*     BNP Recent Labs  Lab 05/01/18 1024  BNP 1,279.0*      Radiology    Dg Chest Portable 1 View  Result Date: 05/01/2018 CLINICAL DATA:  Acute shortness of breath for 2-3 days. EXAM: PORTABLE CHEST 1 VIEW COMPARISON:  04/24/2018 chest radiograph, 05/13/2017 chest CT and prior studies FINDINGS: UPPER limits normal heart size and median sternotomy again noted. Diffuse interstitial opacities are again noted. No definite new pulmonary opacities noted. No pleural effusion, pneumothorax or acute bony abnormalities identified. IMPRESSION: No evidence of acute cardiopulmonary disease. Chronic interstitial lung disease. Electronically Signed   By: Margarette Canada M.D.   On: 05/01/2018 10:44    Telemetry    RSR, brief run of SVT - Personally Reviewed  Cardiac Studies   2D Echocardiogram 7.25.2019  Study Conclusions   - Left ventricle: The cavity size was mildly dilated. Systolic    function was severely reduced. The estimated ejection fraction   was in the range of 25% to 30%. Severe diffuse hypokinesis.   Anterior wall motion best preserved. The study is not technically   sufficient to allow evaluation of LV diastolic function. - Mitral valve: There was mild regurgitation. - Left atrium: The atrium was moderately dilated. - Right ventricle: Systolic function was mildly reduced. - Pulmonary arteries: Systolic pressure could not be accurately   estimated. - Inferior vena cava: The vessel was dilated. The respirophasic   diameter changes were blunted (< 50%), consistent with elevated   central venous pressure.   Impressions:   - Normal study. _____________    Patient Profile     67 y.o. male w/ a h/o CAD, combined CHF, DM, HTN, HL, and severe COPD, who was admitted 9/22 w/ resp failure.  Assessment & Plan    1.  Acute multifactorial respiratory failure:  In the setting of severe COPD and moderate volume overload.  Abx, nebs, and steroids per IM.  Cont diuresis today.  2.  AECOPD:  See #1.   3.  Acute on chronic combined systolic and diastolic CHF:  EF 80-99% by echo in July.  He does not weigh himself at home and has been out of lasix for about 2 wks.  IV lasix started yesterday.  Minu 2.8L overnight (though intake not recorded).  Wt down 2.8Kg from yesterday morning to yesterday evening. Wt pending this AM. He says breathing improved significantly with diuresis.  Still with ankle edema.  He says that he does not normally have edema @ home.  Renal fxn stable.  Cont IV lasix today with plan to resume lasix 20 PO daily tomorrow (prior home dose).  May need 40 daily if he has prolonged steroid requirement. Cont  blocker.  No acei/arb/arni/spiro as BP trends in 90's to low 100's.  We discussed the importance of daily weights, sodium restriction, medication compliance, and symptom reporting and he verbalizes understanding.    4.  CAD/Elevated troponin:  Minimal trop  elevation in the setting of above (0.04 x 1 - not trended).  No chest pain.  Prior inf infarct in 06/1998 w/ PCI/BMS to RCA.  Patent stent on 2004 cath in [place]. No ischemia on MV in 2013.  Cont  blocker.  Add asa/statin (not listed in home meds).  5.  HL:  Add statin given CAD/DM hx.  LDL was 79 in 2017.  Will need outpt f/u.  6.  DMII:  Diet controlled.  A1c 6.3  in July (prev as high as 7.2 in 10/2013).    7.  PSVT:  16 beats of svt on tele.  Asymptomatic.  Cont  blocker. Lytes ok.  Signed, Murray Hodgkins, NP  05/02/2018, 8:37 AM    For questions or updates, please contact   Please consult [email or web address] for contact info under Cardiology/STEMI.

## 2018-05-02 NOTE — Progress Notes (Signed)
Went over education with patient on what his current code status was changed to. Explained what the purple bracelet meant and he was hesitant in agreeing. Spoke with home for a couple of minutes and he stated he wanted "everything done" to be a full code until his wife came to the hospital. Paged palliative and had a long conversation about it, agreed she would change his code back to FULL until wife came in.

## 2018-05-02 NOTE — Progress Notes (Signed)
PALLIATIVE NOTE:  Met with wife and patient at the bedside. Detailed conversation regarding their wishes and code status. Patient also had a few questions in regards to his AD documents. Questions answered and patient and wife completed AD paperwork without signature. They are requesting completion. Bernice notified that patient and wife are now in agreement to DNR/DNI and also to have Chaplain to come and assist with completion of document. DNR purple band placed on patient with his agreement.   Alda Lea, AGPCNP-BC Palliative Medicine Team  Phone: [phone] Fax: [phone] Pager: [phone] Amion: Delane Ginger. Cousar

## 2018-05-02 NOTE — Care Management (Signed)
Patient to have cardiac cath tomorrow.

## 2018-05-02 NOTE — Progress Notes (Addendum)
CCMD reports 16 beat run SVT. Pt asymptomatic and sleeping comfortably with no distress noted. MD text paged and aware, no new orders. Will continue to monitor

## 2018-05-02 NOTE — Consult Note (Signed)
Consultation Note Date: 05/02/2018   Patient Name: Tony Watson  DOB: 16-Mar-1951  MRN: [id number]  Age / Sex: 67 y.o., male  PCP: Owens Loffler, MD Referring Physician: Henreitta Leber, MD  Reason for Consultation: Establishing goals of care  HPI/Patient Profile: 68 y.o. male admitted on 05/01/2018 from home with complaints of shortness of breath and lower extremity swelling.  Patient has a past medical history significant for end-stage COPD, chronic bronchitis, congestive heart failure, arthritis, CMML, type 2 diabetes, hypertension, ischemic cardiomyopathy, hyperlipidemia, TIA, and tobacco abuse.  Patient is also on 2-3 L of oxygen in the home as well as chronic p.o. prednisone 10 mg daily.  During his ED course patient required 4-5 L of oxygen via nasal cannula.  He denied chest pain or fever.  Wife and granddaughter was at the bedside.  Patient was given Lasix IV.  His BNP was 1279.  Troponin 0 0.04.  WBC 12.9.  Since admission patient continues on IV Lasix and steroids.  He has been seen by pulmonology and cardiology.  At this time cardiology is recommending a right and left cardiac cath which will be performed on 9/24.  Palliative medicine team consulted for goals of care discussion.  Clinical Assessment and Goals of Care: I have reviewed medical records including lab results, imaging, Epic notes, and MAR, received report from the bedside RN, and assessed the patient. I then met at the bedside with Tony Watson to discuss diagnosis prognosis, GOC, EOL wishes, disposition and options.  No family was at the bedside.  Patient is alert and oriented x3.  Offered patient to allow wife to be involved in goals of care discussion however he states they recently purchased a new home and she is trying to get things situated.  He states he will be able to update her after our conversation.  I introduced Palliative  Medicine as specialized medical care for people living with serious illness. It focuses on providing relief from the symptoms and stress of a serious illness. The goal is to improve quality of life for both the patient and the family.  We discussed a brief life review of the patient.  Patient reports he and his wife is been married for over 40 years.  They have 1 daughter.  They also have 4 dogs and a bird.  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  Patient reports both he and his daughter recently sold their homes and have now purchased a home together so that they can live in one home for support.  As far as functional and nutritional status.  Mr. Cheese states that prior to admission his appetite was much better than it had been in months.  He reports that one time his weight decreased from 222 down to 132, however, he has regained some of it back.  He reports he has noticed an increase in fatigue and generalized weakness due to his increasing shortness of breath.  He reports over the past several weeks prior to admission he  to take frequent rest breaks due to the symptoms.  He states his new home has a full basement that is handicap assessible.  He currently ambulates with a cane for assistance.  We discussed his current illness and what it means in the larger context of his on-going co-morbidities.  Natural disease trajectory and expectations at EOL were discussed.  Patient verbalized understanding of his current illness and comorbidities.  He became somewhat tearful and anxious when discussing this.  He he was initially laying down in bed but then asked for assistance to sit up on the side of the bed.  He identified that he was anxious talking about the inevitable but no that it is something that needs to be addressed.  Patient states that he knows his heart and his lungs is not in great condition and that he would eventually pass away from complications.  Report was given.   I  attempted to elicit values and goals of care important to the patient.    The difference between aggressive medical intervention and comfort care was considered in light of the patient's goals of care.  At this time patient has requested to continue treating the treatable.  He is not prepared to shift all care to comfort.  He reports he would like to continue with medical interventions and address the necessity on a case-by-case basis.  He discusses the plans to have a cardiac catheterization tomorrow and knows that he is not a surgical candidate however would like to have the procedure done with any interventions that may be possible.  I discussed advanced directives with patient.  Patient states he does not have an official documented advanced directive but he does have the packet given by the hospital chaplain.  He states his plan is to review with his wife when she arrives today and complete the documentation.  She states in the event he is unable to make medical decisions that his wife would be his primary decision maker.  We also discussed in great detail patient's current full CODE STATUS.  Patient reported that he had a DNR completed in his home in the event of an emergency or if his wife or family members found him unresponsive.  He reported at that time he would like to be allowed a natural death without interventions.  Patient was tearful during conversation and began discussing previous experiences with his mother-in-law and sister-in-law.  He reports they both required CPR and life support only for family to have to make decision to withdraw care.  I educated patient on what a potential code could look like in relation to his current illness and comorbidities.  Patient initially stated that he would be okay with remaining a full code and a brief trial of life support.  He expressed that he would not want to be placed on life support for more than a day as he knew that his family will most likely  have to make a decision.  After further discussion and education patient then expressed knowing that his survival chances will be low and that he is already compromised due to his CHF and COPD that he felt it would be most appropriate not to continue with a full code.  However he states he would like for me to discuss this further with his wife and gain her opinion.  Support was given.  I was able to contact Mrs. Harlen Danford as requested by patient.  Introduced myself and palliative medicine to her.  Wife  verbalized understanding and appreciation for my involvement in their care.  I again discussed in details patient's current CODE STATUS.  Wife also endorsed that patient had a out of facility DNR and his wishes in the home.  Further discussion wife verbalizes agreement that she would also like for patient to be a DNR/DNI while hospitalized knowing that his survival chances would be low due to his comorbidities and current illness.  Wife also inquired regarding cardiac catheterization and DNR in place.  I discussed with wife that generally during medical procedures a DNR is placed on hold and patient is treated as a full code while in procedure.  Advised if there were medical complications during the procedure that the cardiologist or any provider would speak with family and provide updates along with how to proceed with medical interventions.  Wife verbalized understanding and appreciation.  Wife stated that she felt that the DNR/DNI would be appropriate in their wishes.  Advised that I would change orders in the computer and would also follow-up with patient to let her him know that I had spoken with her.  Wife verbalized agreement.  Hospice and Palliative Care services outpatient were explained and offered.  In discussion with patient he verbalized that he and his wife had discussed having hospice services involved in his care at discharge.  I also discussed hospice and palliative care services with his  wife. Mrs. Varden expressed her wishes to have hospice services arranged at discharge.  She stated she had mentioned this to someone else on yesterday.  We discussed briefly the goals and care of hospice services.  Wife inquired about how to get services involved and if she needed to contact them herself and was advised that I will place a referral and that our case manager and hospice liaison would be in contact with her.  Wife expressed her appreciation.  She states that she knows that he is not doing well and continues to get weaker.  She also mentions that it continues to get harder to take him back and forth to multiple doctors appointments.  She reported she recently have counseled his pulmonology and oncology appointment not knowing if she would be able to get him back and forth.  Support was given.  Questions and concerns were addressed. The family was encouraged to call with questions or concerns.  PMT will continue to support holistically.  Primary Decision Maker: She is alert and oriented x3.  He is capable of making medical decisions.  However patient expresses that if he is unable to make medical decisions that his wife [name] would be his healthcare power of attorney.  She does have advanced directive paperwork and is currently reviewing with his wife.  He knows to contact nursing staff when he is ready for completion.  NEXT OF KIN    SUMMARY OF RECOMMENDATIONS    DNR/DNI-patient/wife request.  Both patient and wife states that patient has a out of facility DNR in the home.  Continue to treat the treatable while hospitalized.  Patient and wife are requesting outpatient hospice services at discharge.  Case management referral for outpatient hospice.  Patient is somewhat anxious and tearful during conversations.  He requests to continue with his Ativan as scheduled and is worried that he will not have a prescription for this at discharge.  Advised patient that he does have ativan  ordered as needed and this is appropriate. Ativan should also be continued at discharge for anxiety. Wife verbalized in conversation that he  gets anxious when discussing or thinking about dying as well as when he feels increasingly short of breath.   Palliative Medicine team will continue to support patient, family, and medical team during hospitalization.   Code Status/Advance Care Planning:  DNR/DNI    Symptom Management:   Ativan PRN for anxiety   Palliative Prophylaxis:   Aspiration, Bowel Regimen and Frequent Pain Assessment  Additional Recommendations (Limitations, Scope, Preferences):  Full Scope Treatment-continue to treat the treatable   Psycho-social/Spiritual:   Desire for further Chaplaincy support:YES for AD completion when ready   Prognosis:   Unable to determine-guarded to poor in the setting of end-stage COPD, CHF, diabetes, htn, anxiety, CAD, EF 25%, decreased mobility, oxygen use, shortness of breath, generalized weakness, and acute on chronic hypoxic respiratory failure with acute pulmonary edema.   Discharge Planning: Home with Hospice      Primary Diagnoses: Present on Admission: . Respiratory failure with hypoxia ([place])   I have reviewed the medical record, interviewed the patient and family, and examined the patient. The following aspects are pertinent.  Past Medical History:  Diagnosis Date  . Arthritis   . CAD (coronary artery disease)    inferior MI 11/99 (in [place]) with PCI to [facility]. last [place] 2004 [facility]): EF 50%, patent RCA stent. no obstructive disease. last myoview 2008: EF 42%, inferior infarct, no ischemia.   . 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 Chronic combined systolic (congestive) and diastolic (congestive) heart failure ([place])    a. 7/29019 Echo: EF 25-30%, sev diff HK. Mld MR. Mod dil LA.  Marland Kitchen COPD (chronic obstructive pulmonary disease) ([place])   . Diabetic autonomic neuropathy associated with type 2 diabetes mellitus ([place]) 03/27/2015  .  Diverticulosis 2002  . DM2 (diabetes mellitus, type 2) ([facility])    A1c 6.0% 12/11  . GERD (gastroesophageal reflux disease)   . HTN (hypertension)   . Hyperlipidemia   . Ischemic cardiomyopathy    mild with EF 42% on myoviews 2008.  . Medical non-compliance   . Nicotine dependence    chronic, active  . Smoker   . Spinal stenosis   . 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 TIA (transient ischemic attack)    hx; now s/p PFO closure 2004 (in [place])  . Ulcer    Social History   Socioeconomic History  . Marital status: Married    Spouse name: Not on file  . Number of children: Not on file  . Years of education: Not on file  . Highest education level: Not on file  Occupational History  . Not on file  Social Needs  . Financial resource strain: Not on file  . Food insecurity:    Worry: Not on file    Inability: Not on file  . Transportation needs:    Medical: Not on file    Non-medical: Not on file  Tobacco Use  . Smoking status: Former Smoker    Packs/day: 0.50    Years: 45.00    Pack years: 22.50    Types: Cigarettes    Last attempt to quit: 01/25/2018    Years since quitting: 0.2  . Smokeless tobacco: Never Used  . Tobacco comment: 1 ppd +40 years  Substance and Sexual Activity  . Alcohol use: Not Currently    Alcohol/week: 0.0 standard drinks    Comment: weekly but last dose 1 month  . Drug use: No  . Sexual activity: Not on file  Lifestyle  . Physical  activity:    Days per week: Not on file    Minutes per session: Not on file  . Stress: Not on file  Relationships  . Social connections:    Talks on phone: Not on file    Gets together: Not on file    Attends religious service: Not on file    Active member of club or organization: Not on file    Attends meetings of clubs or organizations: Not on file    Relationship status: Not on file  Other Topics Concern  . Not on file  Social History Narrative   Married, 1 daughter.  Lives in [place], [place]    Family History  Problem Relation Age of Onset  . Coronary artery disease Unknown        family hx  . Breast cancer Unknown        1st egree relative <50  . Cancer Mother   . Heart disease Father    Scheduled Meds: . arformoterol  15 mcg Nebulization Q12H  . aspirin  81 mg Oral Daily  . atorvastatin  40 mg Oral q1800  . azithromycin  500 mg Oral Daily  . budesonide  0.5 mg Nebulization BID  . carvedilol  12.5 mg Oral BID WC  . cholecalciferol  2,000 Units Oral QPM  . enoxaparin (LOVENOX) injection  40 mg Subcutaneous Q24H  . furosemide  20 mg Intravenous Q12H  . gabapentin  300 mg Oral QHS  . ipratropium-albuterol  3 mL Nebulization Q6H  . megestrol  40 mg Oral Daily  . methylPREDNISolone (SOLU-MEDROL) injection  60 mg Intravenous Q8H  . multivitamin with minerals  1 tablet Oral Daily  . nystatin  5 mL Mouth/Throat QID  . pantoprazole  40 mg Oral QAC breakfast  . potassium chloride  20 mEq Oral Daily  . protein supplement  2-3 scoop Oral TID WC  . sodium chloride flush  3 mL Intravenous Q12H  . tiZANidine  4 mg Oral Nightly  . ascorbic acid  250 mg Oral BID   Continuous Infusions: . sodium chloride     PRN Meds:.sodium chloride, acetaminophen, acetaminophen, albuterol, LORazepam, ondansetron (ZOFRAN) IV, oxymetazoline, pseudoephedrine, sodium chloride flush Medications Prior to Admission:  Prior to Admission medications   Medication Sig Start Date End Date Taking? Authorizing Provider  acetaminophen (TYLENOL) 500 MG tablet Take 1,000 mg by mouth daily as needed for moderate pain or headache.    [provider]  albuterol (PROVENTIL) (2.5 MG/3ML) 0.083% nebulizer solution Take 3 mLs (2.5 mg total) by nebulization every 4 (four) hours as needed for wheezing or shortness of breath. 03/06/18   Vaughan Basta, MD  budesonide (PULMICORT) 0.5 MG/2ML nebulizer solution Take 2 mLs (0.5 mg total) by nebulization 2 (two) times daily. 03/06/18 03/06/19  Vaughan Basta, MD  carvedilol (COREG) 6.25 MG tablet Take 12.5 mg by mouth 2 (two) times daily with a meal.    [provider]  Cholecalciferol (VITAMIN D) 2000 units CAPS Take 2,000 Units by mouth every evening.    [provider]  formoterol (PERFOROMIST) 20 MCG/2ML nebulizer solution Take 2 mLs (20 mcg total) by nebulization 2 (two) times daily. 02/24/18   [name], MD  furosemide (LASIX) 20 MG tablet Take 1 tablet (20 mg total) by mouth daily. 04/27/18   Copland, Frederico Hamman, MD  gabapentin (NEURONTIN) 300 MG capsule Take 1 capsule (300 mg total) by mouth at bedtime. 03/23/18   Copland, Frederico Hamman, MD  ipratropium-albuterol (DUONEB) 0.5-2.5 (3) MG/3ML SOLN  Take 3 mLs by nebulization every 4 (four) hours as needed. 03/11/18   [provider]  LORazepam (ATIVAN) 0.5 MG tablet Take 1 tablet (0.5 mg total) by mouth every 6 (six) hours as needed for anxiety. 04/06/18   Copland, Frederico Hamman, MD  megestrol (MEGACE) 40 MG tablet Take 1 tablet (40 mg total) by mouth daily. 04/26/17   Lloyd Huger, MD  Multiple Vitamin (MULTIVITAMIN WITH MINERALS) TABS tablet Take 1 tablet by mouth daily. 03/07/18   Vaughan Basta, MD  nystatin (MYCOSTATIN) 100000 UNIT/ML suspension Use as directed 5 mLs (500,000 Units total) in the mouth or throat 4 (four) times daily. 03/23/18   Copland, Frederico Hamman, MD  omeprazole (PRILOSEC) 20 MG capsule Take 20 mg by mouth daily.    [provider]  oxymetazoline (AFRIN) 0.05 % nasal spray Place 1 spray into both nostrils daily as needed for congestion.    [provider]  predniSONE (DELTASONE) 10 MG tablet Take 1 tablet (10 mg total) by mouth daily with breakfast. 04/29/18   Copland, Frederico Hamman, MD  protein supplement (RESOURCE BENEPROTEIN) POWD Take 12-18 g by mouth 3 (three) times daily with meals. 03/06/18   Vaughan Basta, MD  pseudoephedrine (SUDAFED) 30 MG tablet Take 30 mg by mouth daily as needed for congestion.     [provider]  tiZANidine (ZANAFLEX) 4 MG tablet Take 1 tablet (4 mg total) by mouth Nightly. 03/23/18   Copland, Frederico Hamman, MD  vitamin C (VITAMIN C) 250 MG tablet Take 1 tablet (250 mg total) by mouth 2 (two) times daily. 03/06/18   Vaughan Basta, MD   No Known Allergies Review of Systems  Constitutional: Positive for activity change and fatigue.  Respiratory: Positive for cough and shortness of breath.   Cardiovascular: Positive for leg swelling.  Neurological: Positive for weakness.  All other systems reviewed and are negative.   Physical Exam  Constitutional: He is oriented to person, place, and time. Vital signs are normal. He is cooperative. He appears ill.  Thin and frail in appearance   Cardiovascular: Normal rate, regular rhythm, normal heart sounds and normal pulses.  Bilateral lower extremity edema   Pulmonary/Chest: He has wheezes. He has rales.  Shortness of breath with excertion. 4L/[place]ut   Abdominal: Normal appearance and bowel sounds are normal. He exhibits distension.  Neurological: He is alert and oriented to person, place, and time.  Skin: Skin is warm, dry and intact.  Psychiatric: His speech is normal and behavior is normal. Judgment and thought content normal. His mood appears anxious. Cognition and memory are normal.  Nursing note and vitals reviewed.   Vital Signs: BP 107/74 (BP Location: Right Arm)   Pulse 85   Temp (!) 97.4 F (36.3 C) (Oral)   Resp 18   Ht '5\' 11"'$  (1.803 m)   Wt 65.6 kg   SpO2 98%   BMI 20.17 kg/m  Pain Scale: 0-10   Pain Score: 0-No pain   SpO2: SpO2: 98 % O2 Device:SpO2: 98 % O2 Flow Rate: .O2 Flow Rate (L/min): 4 L/min  IO: Intake/output summary:   Intake/Output Summary (Last 24 hours) at 05/02/2018 1146 Last data filed at 05/02/2018 0906 Gross per 24 hour  Intake 243 ml  Output 3100 ml  Net -2857 ml    LBM: Last BM Date: 05/01/18 Baseline Weight: Weight: 68.4 kg Most recent weight: Weight: 65.6 kg       Palliative Assessment/Data:PPS 30%   Time In: 1000 Time Out: 1130 Time Total: 90 min.  Greater than 50%  of this time was spent counseling and coordinating care related to the above assessment and plan.  Signed by:  Alda Lea, AGPCNP-BC Palliative Medicine Team  Phone: [phone] Fax: [phone] Pager: [phone] Amion: Bjorn Pippin    Please contact Palliative Medicine Team phone at [phone] for questions and concerns.  For individual provider: See Shea Evans

## 2018-05-02 NOTE — Progress Notes (Signed)
Pt responded to a phone call for Pt to execute AD. Chaplain could not locate notary. Chaplain will try to complete tomorrow.    05/02/18 1600  Clinical Encounter Type  Visited With Patient and family together  Visit Type Follow-up  Referral From Nurse  Spiritual Encounters  Spiritual Needs Brochure

## 2018-05-03 ENCOUNTER — Encounter: Payer: Self-pay | Admitting: Emergency Medicine

## 2018-05-03 ENCOUNTER — Ambulatory Visit: Payer: Medicare HMO | Admitting: Cardiovascular Disease

## 2018-05-03 ENCOUNTER — Encounter
Admission: EM | Disposition: A | Discharge status: Home Health | Payer: Self-pay | Source: Home / Self Care | Attending: Specialist

## 2018-05-03 DIAGNOSIS — I2510 Atherosclerotic heart disease of native coronary artery without angina pectoris: Secondary | ICD-10-CM

## 2018-05-03 DIAGNOSIS — I5023 Acute on chronic systolic (congestive) heart failure: Secondary | ICD-10-CM

## 2018-05-03 DIAGNOSIS — Z66 Do not resuscitate: Secondary | ICD-10-CM

## 2018-05-03 DIAGNOSIS — I5043 Acute on chronic combined systolic (congestive) and diastolic (congestive) heart failure: Secondary | ICD-10-CM

## 2018-05-03 HISTORY — PX: RIGHT/LEFT HEART CATH AND CORONARY ANGIOGRAPHY: CATH118266

## 2018-05-03 LAB — BASIC METABOLIC PANEL
Anion gap: 9 (ref 5–15)
BUN: 23 mg/dL (ref 8–23)
CO2: 33 mmol/L — AB (ref 22–32)
Calcium: 9 mg/dL (ref 8.9–10.3)
Chloride: 93 mmol/L — ABNORMAL LOW (ref 98–111)
Creatinine, Ser: 0.7 mg/dL (ref 0.61–1.24)
GFR calc non Af Amer: >60 mL/min (ref >60)
Glucose, Bld: 243 mg/dL — ABNORMAL HIGH (ref 70–99)
POTASSIUM: 3.5 mmol/L (ref 3.5–5.1)
Sodium: 135 mmol/L (ref 135–145)

## 2018-05-03 LAB — GLUCOSE, CAPILLARY: GLUCOSE-CAPILLARY: 174 mg/dL — AB (ref 70–99)

## 2018-05-03 SURGERY — RIGHT/LEFT HEART CATH AND CORONARY ANGIOGRAPHY
Anesthesia: Moderate Sedation

## 2018-05-03 MED ORDER — MIDAZOLAM HCL 2 MG/2ML IJ SOLN
INTRAMUSCULAR | Status: AC
  Filled 2018-05-03: qty 2

## 2018-05-03 MED ORDER — ENOXAPARIN SODIUM 40 MG/0.4ML ~~LOC~~ SOLN
40.0000 mg | SUBCUTANEOUS | Status: DC
  Administered 2018-05-04 – 2018-05-08 (×5): 40 mg via SUBCUTANEOUS
  Filled 2018-05-03 (×5): qty 0.4

## 2018-05-03 MED ORDER — MIDAZOLAM HCL 2 MG/2ML IJ SOLN
INTRAMUSCULAR | Status: DC | PRN
  Administered 2018-05-03: 1 mg via INTRAVENOUS

## 2018-05-03 MED ORDER — ENSURE ENLIVE PO LIQD
237.0000 mL | Freq: Two times a day (BID) | ORAL | Status: DC
  Administered 2018-05-03 – 2018-05-08 (×10): 237 mL via ORAL

## 2018-05-03 MED ORDER — HEPARIN SODIUM (PORCINE) 1000 UNIT/ML IJ SOLN
INTRAMUSCULAR | Status: DC | PRN
  Administered 2018-05-03: 3500 [IU] via INTRAVENOUS

## 2018-05-03 MED ORDER — VERAPAMIL HCL 2.5 MG/ML IV SOLN
INTRAVENOUS | Status: AC
  Filled 2018-05-03: qty 2

## 2018-05-03 MED ORDER — FENTANYL CITRATE (PF) 100 MCG/2ML IJ SOLN
INTRAMUSCULAR | Status: DC | PRN
  Administered 2018-05-03: 25 ug via INTRAVENOUS

## 2018-05-03 MED ORDER — FENTANYL CITRATE (PF) 100 MCG/2ML IJ SOLN
INTRAMUSCULAR | Status: AC
  Filled 2018-05-03: qty 2

## 2018-05-03 MED ORDER — SODIUM CHLORIDE 0.9 % IV SOLN: 250.0000 mL | INTRAVENOUS | Status: DC | PRN

## 2018-05-03 MED ORDER — LOSARTAN POTASSIUM 25 MG PO TABS
12.5000 mg | ORAL_TABLET | Freq: Every day | ORAL | Status: DC
  Administered 2018-05-03 – 2018-05-07 (×5): 12.5 mg via ORAL
  Filled 2018-05-03 (×5): qty 1

## 2018-05-03 MED ORDER — SODIUM CHLORIDE 0.9% FLUSH: 3.0000 mL | INTRAVENOUS | Status: DC | PRN

## 2018-05-03 MED ORDER — CARVEDILOL 6.25 MG PO TABS
6.2500 mg | ORAL_TABLET | Freq: Two times a day (BID) | ORAL | Status: DC
  Administered 2018-05-03 – 2018-05-07 (×8): 6.25 mg via ORAL
  Filled 2018-05-03 (×8): qty 1

## 2018-05-03 MED ORDER — HEPARIN SODIUM (PORCINE) 1000 UNIT/ML IJ SOLN
INTRAMUSCULAR | Status: AC
  Filled 2018-05-03: qty 1

## 2018-05-03 MED ORDER — METHYLPREDNISOLONE SODIUM SUCC 125 MG IJ SOLR
60.0000 mg | Freq: Every day | INTRAMUSCULAR | Status: DC
  Administered 2018-05-04 – 2018-05-05 (×2): 60 mg via INTRAVENOUS
  Filled 2018-05-03 (×2): qty 2

## 2018-05-03 MED ORDER — VERAPAMIL HCL 2.5 MG/ML IV SOLN
INTRAVENOUS | Status: DC | PRN
  Administered 2018-05-03: 2.5 mg via INTRA_ARTERIAL

## 2018-05-03 MED ORDER — HEPARIN (PORCINE) IN NACL 1000-0.9 UT/500ML-% IV SOLN
INTRAVENOUS | Status: AC
  Filled 2018-05-03: qty 1000

## 2018-05-03 MED ORDER — SODIUM CHLORIDE 0.9% FLUSH
3.0000 mL | Freq: Two times a day (BID) | INTRAVENOUS | Status: DC
  Administered 2018-05-03 – 2018-05-08 (×10): 3 mL via INTRAVENOUS

## 2018-05-03 SURGICAL SUPPLY — 9 items
CATH BALLN WEDGE 5F 110CM (CATHETERS) ×3 IMPLANT
CATH INFINITI 5FR TG (CATHETERS) ×3 IMPLANT
DEVICE RAD TR BAND REGULAR (VASCULAR PRODUCTS) ×3 IMPLANT
GLIDESHEATH SLEND SS 6F .021 (SHEATH) ×3 IMPLANT
KIT MANI 3VAL PERCEP (MISCELLANEOUS) ×3 IMPLANT
KIT RIGHT HEART (MISCELLANEOUS) ×3 IMPLANT
PACK CARDIAC CATH (CUSTOM PROCEDURE TRAY) ×3 IMPLANT
SHEATH GLIDE SLENDER 4/5FR (SHEATH) ×3 IMPLANT
WIRE ROSEN-J .035X260CM (WIRE) ×3 IMPLANT

## 2018-05-03 NOTE — Interval H&P Note (Signed)
History and Physical Interval Note:  05/03/2018 9:41 AM  Tony Watson  has presented today for cardiac catheterization, with the diagnosis of ischemic cardiomyopathy, acute on chronic systolic heart failure.  The various methods of treatment have been discussed with the patient and family. After consideration of risks, benefits and other options for treatment, the patient has consented to  Procedure(s): RIGHT/LEFT HEART CATH AND CORONARY ANGIOGRAPHY (N/A) as a surgical intervention .  The patient's history has been reviewed, patient examined, no change in status, stable for surgery.  I have reviewed the patient's chart and labs.  Questions were answered to the patient's satisfaction.    Cath Lab Visit (complete for each Cath Lab visit)  Clinical Evaluation Leading to the Procedure:   ACS: No.  Non-ACS:    Anginal/HF Classification: NYHA class IV  Anti-ischemic medical therapy: Minimal Therapy (1 class of medications)  Non-Invasive Test Results: No non-invasive testing performed (LVEF < 35% by echo -> high risk)  Prior CABG: No previous CABG  Tony Watson

## 2018-05-03 NOTE — Progress Notes (Signed)
[facility] NAME: Tony Watson    MR#:  [id number]  DATE OF BIRTH:  [birth year]  SUBJECTIVE:   Pt. Seen post cardiac catherization today.  Doing well. Shortness of breath improved.  No chest pain. Leg swelling has improved.    REVIEW OF SYSTEMS:    Review of Systems  Constitutional: Negative for chills and fever.  HENT: Negative for congestion and tinnitus.   Eyes: Negative for blurred vision and double vision.  Respiratory: Positive for shortness of breath. Negative for cough and wheezing.   Cardiovascular: Positive for leg swelling. Negative for chest pain, orthopnea and PND.  Gastrointestinal: Negative for abdominal pain, diarrhea, nausea and vomiting.  Genitourinary: Negative for dysuria and hematuria.  Neurological: Negative for dizziness, sensory change and focal weakness.  All other systems reviewed and are negative.   Nutrition: Heart Healthy Tolerating Diet: Yes Tolerating PT: Ambulatory  DRUG ALLERGIES:  No Known Allergies  VITALS:  Blood pressure 126/76, pulse 100, temperature 98.2 F (36.8 C), temperature source Oral, resp. rate 18, height 5\' 11"  (1.803 m), weight 66.1 kg, SpO2 100 %.  PHYSICAL EXAMINATION:   Physical Exam  GENERAL:  67 y.o.-year-old patient lying in bed in no acute distress.  EYES: Pupils equal, round, reactive to light and accommodation. No scleral icterus. Extraocular muscles intact.  HEENT: Head atraumatic, normocephalic. Oropharynx and nasopharynx clear.  NECK:  Supple, + jugular venous distention. No thyroid enlargement, no tenderness.  LUNGS: Normal breath sounds bilaterally, no wheezing, bibasilar rales, rhonchi. No use of accessory muscles of respiration.  CARDIOVASCULAR: S1, S2 normal. No murmurs, rubs, or gallops.  ABDOMEN: Soft, nontender, nondistended. Bowel sounds present. No organomegaly or mass.  EXTREMITIES: No cyanosis, clubbing, +1 edema b/l.    NEUROLOGIC: Cranial nerves  II through XII are intact. No focal Motor or sensory deficits b/l. Globally weak.    PSYCHIATRIC: The patient is alert and oriented x 3.  SKIN: No obvious rash, lesion, or ulcer.    LABORATORY PANEL:   CBC Recent Labs  Lab 05/01/18 1024  WBC 12.9*  HGB 13.6  HCT 39.7*  PLT 238   ------------------------------------------------------------------------------------------------------------------  Chemistries  Recent Labs  Lab 05/01/18 1024  05/03/18 0253  NA 134*   < > 135  K 4.4   < > 3.5  CL 92*   < > 93*  CO2 32   < > 33*  GLUCOSE 136*   < > 243*  BUN 16   < > 23  CREATININE 0.73   < > 0.70  CALCIUM 9.1   < > 9.0  AST 56*  --   --   ALT 70*  --   --   ALKPHOS 78  --   --   BILITOT 1.0  --   --    < > = values in this interval not displayed.   ------------------------------------------------------------------------------------------------------------------  Cardiac Enzymes Recent Labs  Lab 05/01/18 1024  TROPONINI 0.04*   ------------------------------------------------------------------------------------------------------------------  RADIOLOGY:  No results found.   ASSESSMENT AND PLAN:   67 yo male with past medical history of diabetes, hypertension, coronary artery disease status post stent placement, combined systolic/diastolic CHF, hypertension, hyperlipidemia, medical noncompliance, history of previous CVA who presented to the hospital due to shortness of breath.  1.  Acute on chronic respiratory failure with hypoxia- secondary to CHF/COPD exacerbation. - Continue diuresis with IV Lasix.  Continue IV steroids but will taper, cont. scheduled duo nebs, Pulmicort nebs for the COPD  exacerbation.  Wean off oxygen as tolerated. - improving.  2.  CHF- acute on chronic combined diastolic/systolic dysfunction. -Patient had run out of his Lasix.  Continue diuresis with IV Lasix, patient is responding and feeling better.  About 5 L (-) since admission.  As per  Cardiology cont. One more day of IV diuresis.  -Continue carvedilol, Lasix  3.  COPD exacerbation-secondary to ongoing tobacco abuse.  Chest x-ray showing no evidence of acute pneumonia. No wheezing/bronchospasm. -Continue IV steroids but will taper, scheduled duo nebs, Pulmicort nebs. Cont. Empiric Zithromax. Cont. Brovana  4.  Ischemic cardiomyopathy- patient has a previous ejection fraction of 45%.  Repeat echo during this hospitalization showing significant reduction in ejection fraction of 15 to 20%.  Status post cardiac catheterization today showing Three-vessel coronary artery disease with moderate to severe calcification, as outlined above, predominantly affecting the ramus intermedius and RCA (80 to 90% stenoses).  There is moderate LAD disease as well as 80% stenosis of small D2 branch. Patent mid RCA stent with mild in-stent restenosis. - Cardiology recommending medical management for significant two-vessel CAD. - Continue IV diuresis, decrease carvedilol to 6.25 mg and low-dose losartan added.  5.  GERD-continue Protonix.  Likely discharge home tomorrow if doing well.   All the records are reviewed and case discussed with Care Management/Social Worker. Management plans discussed with the patient, family and they are in agreement.  CODE STATUS: DNR  DVT Prophylaxis: Lovenox  TOTAL TIME TAKING CARE OF THIS PATIENT: 30 minutes.   POSSIBLE D/C IN 1-2 DAYS, DEPENDING ON CLINICAL CONDITION.   Henreitta Leber M.D on 05/03/2018 at 3:11 PM  Between 7am to 6pm - Pager - [phone]  After 6pm go to [email or web address] - Proofreader  [facility]  Office  [phone]  CC: Primary care physician; Owens Loffler, MD

## 2018-05-03 NOTE — Progress Notes (Signed)
Daily Progress Note   Patient Name: Tony Watson       Date: 05/03/2018 DOB: 1951/04/25  Age: 67 y.o. MRN#: [id number] Attending Physician: Henreitta Leber, MD Primary Care Physician: Owens Loffler, MD Admit Date: 05/01/2018  Reason for Consultation/Follow-up: Establishing goals of care  Subjective: Patient sitting up on side of bed ordering lunch. Wife at bedside. Patient is s/p cardiac cath. He denies pain or shortness of breath. Verbalizes that he is thankful he survived the procedure. Hopeful to be able to go home in the next day or so.   We briefly reviewed goals of care discussion from yesterday. Patient and wife verbalized that they did not have any further questions. Confirmed their wishes to remain DNR/DNI now that he has completed cardiac cath. Expresses goal is to return home with hospice services if possible. Patient and wife had questions in regards to services being arranged prior to discharge and what to expect. Advise that referral has been placed and they should expect to hear from case manager and hospice liaison prior to discharge with full details of expectations at discharge. Patient and wife verbalized understanding and agreement.   Chart Reviewed.   Length of Stay: 2  Current Medications: Scheduled Meds:  . arformoterol  15 mcg Nebulization Q12H  . aspirin  81 mg Oral Daily  . atorvastatin  40 mg Oral q1800  . budesonide  0.5 mg Nebulization BID  . carvedilol  6.25 mg Oral BID WC  . cholecalciferol  2,000 Units Oral QPM  . [START ON 05/04/2018] enoxaparin (LOVENOX) injection  40 mg Subcutaneous Q24H  . furosemide  20 mg Intravenous Q12H  . gabapentin  300 mg Oral QHS  . ipratropium-albuterol  3 mL Nebulization Q6H  . losartan  12.5 mg Oral Daily  .  megestrol  40 mg Oral Daily  . methylPREDNISolone (SOLU-MEDROL) injection  60 mg Intravenous Q8H  . multivitamin with minerals  1 tablet Oral Daily  . nystatin  5 mL Mouth/Throat QID  . pantoprazole  40 mg Oral QAC breakfast  . potassium chloride  20 mEq Oral Daily  . protein supplement  2-3 scoop Oral TID WC  . sodium chloride flush  3 mL Intravenous Q12H  . sodium chloride flush  3 mL Intravenous Q12H  . tiZANidine  4 mg Oral Nightly  .  ascorbic acid  250 mg Oral BID    Continuous Infusions: . sodium chloride    . sodium chloride      PRN Meds: sodium chloride, sodium chloride, acetaminophen, acetaminophen, albuterol, LORazepam, ondansetron (ZOFRAN) IV, oxymetazoline, pseudoephedrine, sodium chloride flush, sodium chloride flush  Physical Exam  Constitutional: He is oriented to person, place, and time. Vital signs are normal. He is cooperative. He appears ill.  Thin, frail, and critically ill in appearance.   Cardiovascular: Normal rate, regular rhythm, normal heart sounds and normal pulses.  No bleeding at cardiac cath site, dressing clean, dry, intact   Pulmonary/Chest: Effort normal. He has decreased breath sounds.  4L/[place]   Neurological: He is alert and oriented to person, place, and time. He displays atrophy.  Psychiatric: Judgment normal. Cognition and memory are normal.  Nursing note and vitals reviewed.           Vital Signs: BP 126/76 (BP Location: Left Arm)   Pulse 100   Temp 98.2 F (36.8 C) (Oral)   Resp 18   Ht 5\' 11"  (1.803 m)   Wt 66.1 kg   SpO2 100%   BMI 20.32 kg/m  SpO2: SpO2: 100 % O2 Device: O2 Device: Nasal Cannula O2 Flow Rate: O2 Flow Rate (L/min): 4 L/min  Intake/output summary:   Intake/Output Summary (Last 24 hours) at 05/03/2018 1422 Last data filed at 05/03/2018 0730 Gross per 24 hour  Intake -  Output 2300 ml  Net -2300 ml   LBM: Last BM Date: 04/30/18 Baseline Weight: Weight: 68.4 kg Most recent weight: Weight: 66.1 kg        Palliative Assessment/Data: PPS 30%   Patient Active Problem List   Diagnosis Date Noted  . Acute on chronic systolic heart failure ([place])   . ILD (interstitial lung disease) ([place])   . DNR / DNI, Advanced Directives Counselling 04/27/2018 04/28/2018    Class: Acute  . Ischemic cardiomyopathy 03/24/2018  . COPD with acute exacerbation ([place]) 03/24/2018  . Respiratory failure with hypoxia ([place]) 03/03/2018  . Protein-calorie malnutrition, severe 03/03/2018  . 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 Diabetic autonomic neuropathy associated with type 2 diabetes mellitus ([place]) 03/27/2015  . History of inferior MI (myocardial infarction) 10/14/2013  . CAD (coronary artery disease)   . Hypertensive heart disease   . History of noncompliance with medical treatment 05/30/2012  . Systolic heart failure secondary to coronary artery disease ([place]) 05/30/2012  . Severe chronic obstructive pulmonary disease ([place]) 03/23/2011  . Other emphysema ([place]) 03/23/2011  . ALLERGIC RHINITIS CAUSE UNSPECIFIED 07/31/2010  . Type 2 diabetes mellitus with vascular disease ([place]) 01/02/2010  . TOBACCO USE 04/17/2009  . TRANSIENT ISCHEMIC ATTACKS, HX OF 04/17/2009  . Hyperlipidemia 12/19/2008    Palliative Care Assessment & Plan   Patient Profile: 67 y.o. male admitted on 05/01/2018 from home with complaints of shortness of breath and lower extremity swelling.  Patient has a past medical history significant for end-stage COPD, chronic bronchitis, congestive heart failure, arthritis, CMML, type 2 diabetes, hypertension, ischemic cardiomyopathy, hyperlipidemia, TIA, and tobacco abuse.  Patient is also on 2-3 L of oxygen in the home as well as chronic p.o. prednisone 10 mg daily.  During his ED course patient required 4-5 L of oxygen via nasal cannula.  He denied chest pain or fever.  Wife and granddaughter was at the bedside.  Patient was given Lasix IV.  His BNP was 1279.   Troponin 0 0.04.  WBC 12.9.  Since admission patient continues on IV Lasix and steroids.  He has been seen by pulmonology and cardiology.  At this time cardiology is recommending a right and left cardiac cath which will be performed on 9/24.  Palliative medicine team consulted for goals of care discussion.  Recommendations/Plan:  DNR/DNI-patient/wife request.  Both patient and wife states that patient has a out of facility DNR in the home.  Continue to treat the treatable while hospitalized. S/P cardiac cath.   Patient and wife are requesting outpatient hospice services at discharge.  Case management referral for outpatient hospice.  Palliative Medicine team will continue to support patient, family, and medical team during hospitalization.   Goals of Care and Additional Recommendations:  Limitations on Scope of Treatment: Full Scope Treatment  Code Status:    Code Status Orders  (From admission, onward)         Start     Ordered   05/03/18 1108  Do not attempt resuscitation (DNR)  Continuous    Question Answer Comment  In the event of cardiac or respiratory ARREST Do not call a "code blue"   In the event of cardiac or respiratory ARREST Do not perform Intubation, CPR, defibrillation or ACLS   In the event of cardiac or respiratory ARREST Use medication by any route, position, wound care, and other measures to relive pain and suffering. May use oxygen, suction and manual treatment of airway obstruction as needed for comfort.      05/03/18 1107        Code Status History    Date Active Date Inactive Code Status Order ID Comments User Context   05/03/2018 0955 05/03/2018 1107 Full Code [id number]  Nelva Bush, MD Inpatient   05/02/2018 1609 05/03/2018 0955 DNR [id number]  Jimmy Footman, NP Inpatient   05/02/2018 1422 05/02/2018 1609 Full Code [id number]  Jimmy Footman, NP Inpatient   05/02/2018 1303 05/02/2018 1422 DNR [id number]  Jimmy Footman, NP Inpatient   05/01/2018 1254 05/02/2018 1303 Full Code [id number]  Nicholes Mango, MD Inpatient   03/24/2018 1948 03/28/2018 1340 Full Code [id number]  Saundra Shelling, MD Inpatient   03/03/2018 0328 03/06/2018 1716 Full Code [id number]  Harrie Foreman, MD Inpatient    Advance Directive Documentation     Most Recent Value  Type of Advance Directive  Out of facility DNR (pink MOST or yellow form)  Pre-existing out of facility DNR order (yellow form or pink MOST form)  -  "MOST" Form in Place?  -       Prognosis:   Unable to determine guarded to poor in the setting of end-stage COPD, CHF, diabetes, htn, anxiety, CAD, EF 25%, decreased mobility, oxygen use, shortness of breath, generalized weakness, and acute on chronic hypoxic respiratory failure with acute pulmonary edema.   Discharge Planning:  Home with Hospice  Care plan was discussed with patient, patient's wife, bedside RN, Anderson Malta, [place].   Thank you for allowing the Palliative Medicine Team to assist in the care of this patient.   Total Time 45 min.  Prolonged Time Billed  NO       Greater than 50%  of this time was spent counseling and coordinating care related to the above assessment and plan.  Alda Lea, AGPCNP-BC Palliative Medicine Team  Pager: [phone] Amion: Bjorn Pippin   Please contact Palliative Medicine Team phone at [phone] for questions and concerns.

## 2018-05-03 NOTE — Plan of Care (Signed)
Nutrition Education Note  RD consulted for nutrition education regarding a Heart Healthy diet.   Lipid Panel     Component Value Date/Time   CHOL 144 07/16/2016 0844   CHOL 106 02/06/2013 0455   TRIG 159.0 (H) 07/16/2016 0844   TRIG 75 02/06/2013 0455   HDL 32.90 (L) 07/16/2016 0844   HDL 37 (L) 02/06/2013 0455   CHOLHDL 4 07/16/2016 0844   VLDL 31.8 07/16/2016 0844   VLDL 15 02/06/2013 0455   LDLCALC 79 07/16/2016 0844   LDLCALC 54 02/06/2013 0455    RD provided "Heart Healthy Nutrition Therapy" handout from the Academy of [employer]etics. Reviewed patient's dietary recall. Provided examples on ways to decrease sodium and fat intake in diet. Discouraged intake of processed foods and use of salt shaker. Encouraged fresh fruits and vegetables as well as whole grain sources of carbohydrates to maximize fiber intake. Teach back method used.  Expect 100% compliance.  Body mass index is 20.32 kg/m. Pt meets criteria for underwt based on current BMI.  Current diet order is heart healthy room service/2028mL fluid restriction, patient is consuming approximately 100% of meals at this time. Labs and medications reviewed. No further nutrition interventions warranted at this time. RD contact information provided. If additional nutrition issues arise, please re-consult RD.  Wife at bedside and very open to suggestions and creating change at home to benefit both her and her husband. RD spoke pt and wife about rinsing canned vegetables before cooking, substituting Ms. Dash for salt and using unsalted butter. Pt is seeing outpt oncology nutrition and trying to gain some of wt lost during treatment.  Lajuan Lines, RD, LDN  After Hours/Weekend Pager: [phone]

## 2018-05-03 NOTE — Care Management Note (Signed)
Case Management Note  Patient Details  Name: Tony Watson MRN: [id number] Date of Birth: 06/20/1951  Subjective/Objective:    Patient and wife have decided to discharge with home health and palliative.  Referral made to [facility] for RN.  He uses [facility] for his O2 which is chronic at home.  Offered PT and SW but they declined at this time.  Santiago Glad with palliative is arranging for discharge.  Patient lives in daughters basement which is completely handicapped accessible.   He has a wheelchair, walker, scooter in the home.               Action/Plan:   Expected Discharge Date:                  Expected Discharge Plan:  [place]  In-House Referral:     Discharge planning Services  CM Consult  Post Acute Care Choice:    Choice offered to:  Spouse, Patient  DME Arranged:    DME Agency:     HH Arranged:  RN [place] Agency:  [facility]  Status of Service:  In process, will continue to follow  If discussed at Long Length of Stay Meetings, dates discussed:    Additional Comments:  Elza Rafter, RN 05/03/2018, 4:03 PM

## 2018-05-03 NOTE — Progress Notes (Signed)
Progress Note  Patient Name: Tony Watson Date of Encounter: 05/03/2018  Primary Cardiologist: Fletcher Anon  Subjective   No acute overnight events. Able to lay supine without issues. Documented UOP of 2 L For the past 24 hours and net - 4.8 L for the admission. Weight 65.6-->66.1 kg. Renal function normal. SOB and lower extremity swelling is improving. Back on his baseline 4 L via nasal cannula. He is for [facility] this morning.   Inpatient Medications    Scheduled Meds: . arformoterol  15 mcg Nebulization Q12H  . aspirin  81 mg Oral Daily  . atorvastatin  40 mg Oral q1800  . azithromycin  500 mg Oral Daily  . budesonide  0.5 mg Nebulization BID  . carvedilol  12.5 mg Oral BID WC  . cholecalciferol  2,000 Units Oral QPM  . enoxaparin (LOVENOX) injection  40 mg Subcutaneous Q24H  . furosemide  20 mg Intravenous Q12H  . gabapentin  300 mg Oral QHS  . ipratropium-albuterol  3 mL Nebulization Q6H  . megestrol  40 mg Oral Daily  . methylPREDNISolone (SOLU-MEDROL) injection  60 mg Intravenous Q8H  . multivitamin with minerals  1 tablet Oral Daily  . nystatin  5 mL Mouth/Throat QID  . pantoprazole  40 mg Oral QAC breakfast  . potassium chloride  20 mEq Oral Daily  . protein supplement  2-3 scoop Oral TID WC  . sodium chloride flush  3 mL Intravenous Q12H  . sodium chloride flush  3 mL Intravenous Q12H  . tiZANidine  4 mg Oral Nightly  . ascorbic acid  250 mg Oral BID   Continuous Infusions: . sodium chloride    . sodium chloride    . sodium chloride 10 mL/hr at 05/03/18 0629   PRN Meds: sodium chloride, sodium chloride, acetaminophen, acetaminophen, albuterol, LORazepam, ondansetron (ZOFRAN) IV, oxymetazoline, pseudoephedrine, sodium chloride flush, sodium chloride flush   Vital Signs    Vitals:   05/02/18 1917 05/02/18 2025 05/03/18 0205 05/03/18 0329  BP: 120/77   99/60  Pulse: 97   69  Resp: 18   18  Temp: 98.4 F (36.9 C)   98.2 F (36.8 C)  TempSrc:      SpO2: 97%  99% 100% 99%  Weight:    66.1 kg  Height:        Intake/Output Summary (Last 24 hours) at 05/03/2018 0721 Last data filed at 05/03/2018 0500 Gross per 24 hour  Intake 246 ml  Output 2300 ml  Net -2054 ml   Filed Weights   05/01/18 1047 05/02/18 0333 05/03/18 0329  Weight: 68.4 kg 65.6 kg 66.1 kg    Telemetry    NSR - Personally Reviewed  ECG    n/a - Personally Reviewed  Physical Exam   GEN: No acute distress.   Neck: No JVD. Cardiac: RRR, no murmurs, rubs, or gallops.  Respiratory: Faint wheezing bilaterally.  GI: Soft, nontender, non-distended.   MS: trace to 1+M bilateral lower extremity edema; No deformity. Neuro:  Alert and oriented x 3; Nonfocal.  Psych: Normal affect.  Labs    Chemistry Recent Labs  Lab 05/01/18 1024 05/02/18 0430 05/03/18 0253  NA 134* 135 135  K 4.4 3.9 3.5  CL 92* 94* 93*  CO2 32 32 33*  GLUCOSE 136* 189* 243*  BUN 16 22 23   CREATININE 0.73 0.75 0.70  CALCIUM 9.1 8.7* 9.0  PROT 7.8  --   --   ALBUMIN 3.8  --   --  AST 56*  --   --   ALT 70*  --   --   ALKPHOS 78  --   --   BILITOT 1.0  --   --   GFRNONAA >60 >60 >60  GFRAA >60 >60 >60  ANIONGAP 10 9 9      Hematology Recent Labs  Lab 05/01/18 1024  WBC 12.9*  RBC 4.06*  HGB 13.6  HCT 39.7*  MCV 98.0  MCH 33.5  MCHC 34.2  RDW 17.0*  PLT 238    Cardiac Enzymes Recent Labs  Lab 05/01/18 1024  TROPONINI 0.04*   No results for input(s): TROPIPOC in the last 168 hours.   BNP Recent Labs  Lab 05/01/18 1024  BNP 1,279.0*     DDimer No results for input(s): DDIMER in the last 168 hours.   Radiology    Dg Chest Portable 1 View  Result Date: 05/01/2018 IMPRESSION: No evidence of acute cardiopulmonary disease. Chronic interstitial lung disease. Electronically Signed   By: Margarette Canada M.D.   On: 05/01/2018 10:44    Cardiac Studies   2D Echocardiogram 7.25.2019  Study Conclusions  - Left ventricle: The cavity size was mildly dilated.  Systolic function was severely reduced. The estimated ejection fraction was in the range of 25% to 30%. Severe diffuse hypokinesis. Anterior wall motion best preserved. The study is not technically sufficient to allow evaluation of LV diastolic function. - Mitral valve: There was mild regurgitation. - Left atrium: The atrium was moderately dilated. - Right ventricle: Systolic function was mildly reduced. - Pulmonary arteries: Systolic pressure could not be accurately estimated. - Inferior vena cava: The vessel was dilated. The respirophasic diameter changes were blunted (<50%), consistent with elevated central venous pressure.  Impressions:  - Normal study. _____________   Patient Profile     67 y.o. male with history of CAD s/p remote PCI, combined CHF, DM, HTN, HL, and severe COPD, who was admitted 9/22 w/ resp failure.  Assessment & Plan    1. Acute on chronic hypoxic respiratory failure: -Multifactorial including COPD and volume overload -Supportive care as below -Supplemental oxygen  2. Acute on chronic combined CHF: -Volume improving -EF 25-30% byu echo in July -EF 25-30% this admission -Documented UOP of 4.8 L for the admission with a weight slightly trending up -Await RHC to better assess hemodynamics to guide diuresis  -Coreg Not on ACEi/ARB/spiro/Entresto given relative hypotension, start if able  -CHF education  3. CAD/elevated troponin: -Minimal troponin bump of 0.04, not trended without chest -Prior inf infarct in 06/1998 s/p PCI/BMS to RCA. Patent stent on 2004 cath in [place]. No ischemia on MV in 2013 -For R/LHC today -Coreg -ASA -Lipitor -Risks and benefits of cardiac catheterization have been discussed with the patient including risks of bleeding, bruising, infection, kidney damage, stroke, heart attack, and death. The patient understands these risks and is willing to proceed with the procedure. All questions have been answered and  concerns listened to   4. HLD: -Lipitor -Outpatient follow up  5. PSVT: -Asymptomatic  -Continue Coreg -Lytes ok  For questions or updates, please contact [place] Please consult [email or web address] for contact info under Cardiology/STEMI.    Signed, Christell Faith, PA-C [facility] Pager: [phone] 05/03/2018, 7:21 AM

## 2018-05-03 NOTE — Progress Notes (Signed)
Inpatient Diabetes Program Recommendations  AACE/ADA: New Consensus Statement on Inpatient Glycemic Control (2019)  Target Ranges:  Prepandial:   less than 140 mg/dL      Peak postprandial:   less than 180 mg/dL (1-2 hours)      Critically ill patients:  140 - 180 mg/dL   Results for MARKEISE, MATHEWS (MRN [id number]) as of 05/03/2018 11:51  Ref. Range 05/03/2018 08:28  Glucose-Capillary Latest Ref Range: 70 - 99 mg/dL 174 (H)  Results for ESLEY, BROOKING (MRN [id number]) as of 05/03/2018 11:51  Ref. Range 05/01/2018 10:24 05/02/2018 04:30 05/03/2018 02:53  Glucose Latest Ref Range: 70 - 99 mg/dL 136 (H) 189 (H) 243 (H)  Results for ZLATAN, HORNBACK (MRN [id number]) as of 05/03/2018 11:51  Ref. Range 03/03/2018 10:45  Hemoglobin A1C Latest Ref Range: 4.8 - 5.6 % 6.3 (H)   Review of Glycemic Control  Diabetes history:DM2 Outpatient Diabetes medications: None Current orders for Inpatient glycemic control: None; Solumedrol 60 mg Q8H  Inpatient Diabetes Program Recommendations: Correction (SSI): While inpatient and ordered steroids, please consider ordering CBGs with Novolog correction scale ACHS.  Thanks, Barnie Alderman, RN, MSN, CDE Diabetes Coordinator Inpatient Diabetes Program [phone] (Team Pager from 8am to 5pm)

## 2018-05-03 NOTE — Progress Notes (Signed)
Tony Watson  Patient has agreed to rescind DNR/DNI status for cardiac catheterization.  Nelva Bush, MD [facility] Pager: [phone] 05/03/18 @ 9:51 AM

## 2018-05-03 NOTE — Progress Notes (Addendum)
New referral for [facility] services at home received from [facility]. Writer met in the room with patient and his wife to initiate education regarding hospice services and philosophy. After a good discussion both patient and his wife feel that [place] with out patient PALLIATIVE following would be a better fit with their current goals. Mrs. Tardif needs help with medication management and coordination of care. She wants "someone to listen" to her when patient has symptoms. CMRN Anderson [place] and Palliative NP Ardelle Park updated. Patient information faxed to referral for out patient Palliative referral. Thank you. Flo Shanks RN, BSN, [place] and [facility], hospital Liaison [phone]

## 2018-05-04 ENCOUNTER — Encounter: Payer: Self-pay | Admitting: *Deleted

## 2018-05-04 DIAGNOSIS — I255 Ischemic cardiomyopathy: Secondary | ICD-10-CM

## 2018-05-04 DIAGNOSIS — J9621 Acute and chronic respiratory failure with hypoxia: Secondary | ICD-10-CM

## 2018-05-04 DIAGNOSIS — I25118 Atherosclerotic heart disease of native coronary artery with other forms of angina pectoris: Secondary | ICD-10-CM

## 2018-05-04 DIAGNOSIS — J849 Interstitial pulmonary disease, unspecified: Secondary | ICD-10-CM

## 2018-05-04 LAB — GLUCOSE, CAPILLARY
Glucose-Capillary: 305 mg/dL — ABNORMAL HIGH (ref 70–99)
Glucose-Capillary: 201 mg/dL — ABNORMAL HIGH (ref 70–99)
Glucose-Capillary: 138 mg/dL — ABNORMAL HIGH (ref 70–99)

## 2018-05-04 LAB — BASIC METABOLIC PANEL
Anion gap: 11 (ref 5–15)
BUN: 24 mg/dL — AB (ref 8–23)
CO2: 29 mmol/L (ref 22–32)
Calcium: 8.9 mg/dL (ref 8.9–10.3)
Chloride: 95 mmol/L — ABNORMAL LOW (ref 98–111)
Creatinine, Ser: 0.71 mg/dL (ref 0.61–1.24)
GFR calc Af Amer: >60 mL/min (ref >60)
GFR calc non Af Amer: >60 mL/min (ref >60)
Glucose, Bld: 303 mg/dL — ABNORMAL HIGH (ref 70–99)
Potassium: 3.3 mmol/L — ABNORMAL LOW (ref 3.5–5.1)
Sodium: 135 mmol/L (ref 135–145)

## 2018-05-04 LAB — CBC
HCT: 35.3 % — ABNORMAL LOW (ref 40.0–52.0)
Hemoglobin: 12.1 g/dL — ABNORMAL LOW (ref 13.0–18.0)
MCH: 33.5 pg (ref 26.0–34.0)
MCHC: 34.4 g/dL (ref 32.0–36.0)
MCV: 97.2 fL (ref 80.0–100.0)
PLATELETS: 176 10*3/uL (ref 150–440)
RBC: 3.63 MIL/uL — ABNORMAL LOW (ref 4.40–5.90)
RDW: 17.2 % — ABNORMAL HIGH (ref 11.5–14.5)
WBC: 12.1 10*3/uL — ABNORMAL HIGH (ref 3.8–10.6)

## 2018-05-04 MED ORDER — POTASSIUM CHLORIDE CRYS ER 20 MEQ PO TBCR
20.0000 meq | EXTENDED_RELEASE_TABLET | Freq: Two times a day (BID) | ORAL | Status: DC
  Administered 2018-05-04: 20 meq via ORAL
  Filled 2018-05-04: qty 1

## 2018-05-04 MED ORDER — INSULIN ASPART 100 UNIT/ML ~~LOC~~ SOLN
0.0000 [IU] | Freq: Three times a day (TID) | SUBCUTANEOUS | Status: DC
  Administered 2018-05-04: 3 [IU] via SUBCUTANEOUS
  Administered 2018-05-04: 7 [IU] via SUBCUTANEOUS
  Administered 2018-05-05: 1 [IU] via SUBCUTANEOUS
  Administered 2018-05-05: 3 [IU] via SUBCUTANEOUS
  Administered 2018-05-05: 7 [IU] via SUBCUTANEOUS
  Administered 2018-05-06: 1 [IU] via SUBCUTANEOUS
  Administered 2018-05-06 – 2018-05-07 (×3): 5 [IU] via SUBCUTANEOUS
  Administered 2018-05-07: 2 [IU] via SUBCUTANEOUS
  Administered 2018-05-08: 5 [IU] via SUBCUTANEOUS
  Filled 2018-05-04 (×11): qty 1

## 2018-05-04 MED ORDER — POTASSIUM CHLORIDE CRYS ER 20 MEQ PO TBCR
20.0000 meq | EXTENDED_RELEASE_TABLET | Freq: Once | ORAL | Status: AC
  Administered 2018-05-04: 20 meq via ORAL
  Filled 2018-05-04: qty 1

## 2018-05-04 MED ORDER — POTASSIUM CHLORIDE CRYS ER 20 MEQ PO TBCR
30.0000 meq | EXTENDED_RELEASE_TABLET | Freq: Two times a day (BID) | ORAL | Status: DC
  Administered 2018-05-04: 30 meq via ORAL
  Filled 2018-05-04 (×2): qty 1

## 2018-05-04 MED ORDER — DOCUSATE SODIUM 100 MG PO CAPS
100.0000 mg | ORAL_CAPSULE | Freq: Two times a day (BID) | ORAL | Status: DC
  Administered 2018-05-04 – 2018-05-08 (×6): 100 mg via ORAL
  Filled 2018-05-04 (×8): qty 1

## 2018-05-04 MED ORDER — FUROSEMIDE 10 MG/ML IJ SOLN
20.0000 mg | Freq: Two times a day (BID) | INTRAMUSCULAR | Status: DC
  Administered 2018-05-04 – 2018-05-06 (×5): 20 mg via INTRAVENOUS
  Filled 2018-05-04 (×4): qty 2

## 2018-05-04 MED ORDER — INSULIN ASPART 100 UNIT/ML ~~LOC~~ SOLN: 0.0000 [IU] | Freq: Every day | SUBCUTANEOUS | Status: DC

## 2018-05-04 MED ORDER — SPIRONOLACTONE 25 MG PO TABS
12.5000 mg | ORAL_TABLET | Freq: Every day | ORAL | Status: DC
  Administered 2018-05-04 – 2018-05-06 (×3): 12.5 mg via ORAL
  Filled 2018-05-04 (×2): qty 1
  Filled 2018-05-04 (×3): qty 0.5
  Filled 2018-05-04: qty 1
  Filled 2018-05-04: qty 0.5

## 2018-05-04 NOTE — Care Management Important Message (Signed)
Copy of signed IM left with patient in room.  

## 2018-05-04 NOTE — Progress Notes (Signed)
[place] [facility] [place] NAME: Tony Watson    MR#:  [id number]  DATE OF BIRTH:  Oct 02, 1950  SUBJECTIVE:   Shortness of breath improved since admission, leg swelling is also improved.  No other acute events overnight.  Remains on IV diuresis.  REVIEW OF SYSTEMS:    Review of Systems  Constitutional: Negative for chills and fever.  HENT: Negative for congestion and tinnitus.   Eyes: Negative for blurred vision and double vision.  Respiratory: Positive for shortness of breath. Negative for cough and wheezing.   Cardiovascular: Positive for leg swelling. Negative for chest pain, orthopnea and PND.  Gastrointestinal: Negative for abdominal pain, diarrhea, nausea and vomiting.  Genitourinary: Negative for dysuria and hematuria.  Neurological: Negative for dizziness, sensory change and focal weakness.  All other systems reviewed and are negative.   Nutrition: Heart Healthy Tolerating Diet: Yes Tolerating PT: Ambulatory  DRUG ALLERGIES:  No Known Allergies  VITALS:  Blood pressure 126/71, pulse (!) 101, temperature 97.7 F (36.5 C), temperature source Oral, resp. rate 18, height 5\' 11"  (1.803 m), weight 66.1 kg, SpO2 99 %.  PHYSICAL EXAMINATION:   Physical Exam  GENERAL:  67 y.o.-year-old patient lying in bed in no acute distress.  EYES: Pupils equal, round, reactive to light and accommodation. No scleral icterus. Extraocular muscles intact.  HEENT: Head atraumatic, normocephalic. Oropharynx and nasopharynx clear.  NECK:  Supple, + jugular venous distention. No thyroid enlargement, no tenderness.  LUNGS: Normal breath sounds bilaterally, no wheezing, bibasilar rales, rhonchi. No use of accessory muscles of respiration.  CARDIOVASCULAR: S1, S2 normal. No murmurs, rubs, or gallops.  ABDOMEN: Soft, nontender, nondistended. Bowel sounds present. No organomegaly or mass.  EXTREMITIES: No cyanosis, clubbing, +1 edema b/l.    NEUROLOGIC:  Cranial nerves II through XII are intact. No focal Motor or sensory deficits b/l. Globally weak.    PSYCHIATRIC: The patient is alert and oriented x 3.  SKIN: No obvious rash, lesion, or ulcer.    LABORATORY PANEL:   CBC Recent Labs  Lab 05/04/18 0253  WBC 12.1*  HGB 12.1*  HCT 35.3*  PLT 176   ------------------------------------------------------------------------------------------------------------------  Chemistries  Recent Labs  Lab 05/01/18 1024  05/04/18 0253  NA 134*   < > 135  K 4.4   < > 3.3*  CL 92*   < > 95*  CO2 32   < > 29  GLUCOSE 136*   < > 303*  BUN 16   < > 24*  CREATININE 0.73   < > 0.71  CALCIUM 9.1   < > 8.9  AST 56*  --   --   ALT 70*  --   --   ALKPHOS 78  --   --   BILITOT 1.0  --   --    < > = values in this interval not displayed.   ------------------------------------------------------------------------------------------------------------------  Cardiac Enzymes Recent Labs  Lab 05/01/18 1024  TROPONINI 0.04*   ------------------------------------------------------------------------------------------------------------------  RADIOLOGY:  No results found.   ASSESSMENT AND PLAN:   67 yo male with past medical history of diabetes, hypertension, coronary artery disease status post stent placement, combined systolic/diastolic CHF, hypertension, hyperlipidemia, medical noncompliance, history of previous CVA who presented to the hospital due to shortness of breath.  1.  Acute on chronic respiratory failure with hypoxia- secondary to CHF/COPD exacerbation. - Continue diuresis with IV Lasix.  Continue IV steroids and will switch to Oral Pred tomorrow, cont. scheduled duo nebs, Pulmicort  nebs for the COPD exacerbation.  Wean off oxygen as tolerated. - much improved.   2.  CHF- acute on chronic combined diastolic/systolic dysfunction. -Patient had run out of his Lasix.  Continue diuresis with IV Lasix, patient is responding and feeling  better.  About 6 L (-) since admission.  As per Cardiology cont. - Continue carvedilol, Losartan, Aldactone.    3.  COPD exacerbation-secondary to ongoing tobacco abuse.  Chest x-ray showing no evidence of acute pneumonia. No wheezing/bronchospasm. -Continue IV steroids but will switch to Oral Pred tomorrow, scheduled duo nebs, Pulmicort nebs. Cont. Brovana  4.  Ischemic cardiomyopathy- patient has a previous ejection fraction of 45%.  Repeat echo during this hospitalization showing significant reduction in ejection fraction of 15 to 20%.  Status post cardiac catheterization showing Three-vessel coronary artery disease with moderate to severe calcification, as outlined above, predominantly affecting the ramus intermedius and RCA (80 to 90% stenoses).  There is moderate LAD disease as well as 80% stenosis of small D2 branch. Patent mid RCA stent with mild in-stent restenosis. - Cardiology recommending medical management for significant two-vessel CAD. - Continue IV diuresis, Coreg, Losartan, Aldactone.   5.  GERD-continue Protonix.  Likely discharge home tomorrow if doing well.   All the records are reviewed and case discussed with Care Management/Social Worker. Management plans discussed with the patient, family and they are in agreement.  CODE STATUS: DNR  DVT Prophylaxis: Lovenox  TOTAL TIME TAKING CARE OF THIS PATIENT: 30 minutes.   POSSIBLE D/C IN 1-2 DAYS, DEPENDING ON CLINICAL CONDITION.   Henreitta Leber M.D on 05/04/2018 at 2:52 PM  Between 7am to 6pm - Pager - [phone]  After 6pm go to [email or web address] - Proofreader  [facility]  Office  [phone]  CC: Primary care physician; Owens Loffler, MD

## 2018-05-04 NOTE — Progress Notes (Signed)
Daily Progress Note   Patient Name: Tony Watson       Date: 05/04/2018 DOB: Mar 21, 1951  Age: 67 y.o. MRN#: [id number] Attending Physician: Henreitta Leber, MD Primary Care Physician: Owens Loffler, MD Admit Date: 05/01/2018  Reason for Consultation/Follow-up: Establishing goals of care  Subjective: Patient sitting up on the side of the bed reading paper. Denies pain or discomfort. Denies shortness of breath. Reports he had a good night and a decent breakfast which he tolerated on this morning. Patient is hopeful that he will get to home tomorrow.   After discussion with Hospice, RN on yesterday patient and wife decided that home health would better serve the needs that they had and align with their goals. Discussed with patient and he stated he is aware of his current condition, however is hopeful for much longer time on earth given procedure on yesterday (cardiac cath), starting on new medications, and knowing he could potentially have procedure in future if needed. Wife verbalizes that medication management and support in the home is their main focus at this time, and they are not prepared to shift care to full comfort. They would like to continue to follow up with their Cardiology team and address any concerns regarding his heart failure. Support given. I agreed that outpatient Palliative would be most appropriate for their expressed wishes. Wife verbalized they are aware of Palliative services goals and services and appreciative of support from medical team and Santiago Glad, Therapist, sports (hospice liaison). Wife did express that she felt patient would not be able to participate in PT or any forms of rehab. Their goal is to return home with additional support. Patient again expressed that their new home is  completely handicap accessible and he has all of the equipment that they feel is necessary to be able to safely manage in the home.   Chart Reviewed.   Length of Stay: 3  Current Medications: Scheduled Meds:  . arformoterol  15 mcg Nebulization Q12H  . aspirin  81 mg Oral Daily  . atorvastatin  40 mg Oral q1800  . budesonide  0.5 mg Nebulization BID  . carvedilol  6.25 mg Oral BID WC  . cholecalciferol  2,000 Units Oral QPM  . enoxaparin (LOVENOX) injection  40 mg Subcutaneous Q24H  . feeding supplement (ENSURE ENLIVE)  237 mL  Oral BID BM  . furosemide  20 mg Intravenous Q12H  . gabapentin  300 mg Oral QHS  . ipratropium-albuterol  3 mL Nebulization Q6H  . losartan  12.5 mg Oral Daily  . megestrol  40 mg Oral Daily  . methylPREDNISolone (SOLU-MEDROL) injection  60 mg Intravenous Daily  . multivitamin with minerals  1 tablet Oral Daily  . nystatin  5 mL Mouth/Throat QID  . pantoprazole  40 mg Oral QAC breakfast  . potassium chloride  20 mEq Oral Daily  . protein supplement  2-3 scoop Oral TID WC  . sodium chloride flush  3 mL Intravenous Q12H  . sodium chloride flush  3 mL Intravenous Q12H  . tiZANidine  4 mg Oral Nightly  . ascorbic acid  250 mg Oral BID    Continuous Infusions: . sodium chloride    . sodium chloride      PRN Meds: sodium chloride, sodium chloride, acetaminophen, acetaminophen, albuterol, LORazepam, ondansetron (ZOFRAN) IV, oxymetazoline, pseudoephedrine, sodium chloride flush, sodium chloride flush  Physical Exam  Constitutional: He is oriented to person, place, and time. He is cooperative. He appears ill.  Thin and chronically ill appearing.   Cardiovascular: Normal rate, regular rhythm, normal heart sounds and normal pulses.  Cath site, clean, dry, intact. No signs of bleeding or discomfort. Bilateral trace edema   Pulmonary/Chest: Effort normal. He has decreased breath sounds. He has wheezes.  4L/[place]   Abdominal: Soft. Normal appearance.    Musculoskeletal:  Generalized weakness   Neurological: He is alert and oriented to person, place, and time.  Nursing note and vitals reviewed.           Vital Signs: BP 107/65 (BP Location: Left Arm)   Pulse 94   Temp 97.7 F (36.5 C) (Oral)   Resp 18   Ht 5\' 11"  (1.803 m)   Wt 66.1 kg   SpO2 99%   BMI 20.34 kg/m  SpO2: SpO2: 99 % O2 Device: O2 Device: Nasal Cannula O2 Flow Rate: O2 Flow Rate (L/min): 4 L/min  Intake/output summary:   Intake/Output Summary (Last 24 hours) at 05/04/2018 0836 Last data filed at 05/04/2018 2536 Gross per 24 hour  Intake 3 ml  Output 950 ml  Net -947 ml   LBM: Last BM Date: 05/02/18 Baseline Weight: Weight: 68.4 kg Most recent weight: Weight: 66.1 kg       Palliative Assessment/Data:PPS 30%   Patient Active Problem List   Diagnosis Date Noted  . Acute on chronic systolic heart failure ([place])   . ILD (interstitial lung disease) ([place])   . DNR / DNI, Advanced Directives Counselling 04/27/2018 04/28/2018    Class: Acute  . Ischemic cardiomyopathy 03/24/2018  . COPD with acute exacerbation ([place]) 03/24/2018  . Respiratory failure with hypoxia ([place]) 03/03/2018  . Protein-calorie malnutrition, severe 03/03/2018  . 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 Diabetic autonomic neuropathy associated with type 2 diabetes mellitus ([place]) 03/27/2015  . History of inferior MI (myocardial infarction) 10/14/2013  . CAD (coronary artery disease)   . Hypertensive heart disease   . History of noncompliance with medical treatment 05/30/2012  . Systolic heart failure secondary to coronary artery disease ([place]) 05/30/2012  . Severe chronic obstructive pulmonary disease ([place]) 03/23/2011  . Other emphysema ([place]) 03/23/2011  . ALLERGIC RHINITIS CAUSE UNSPECIFIED 07/31/2010  . Type 2 diabetes mellitus with vascular disease ([place]) 01/02/2010  . TOBACCO USE 04/17/2009  . TRANSIENT ISCHEMIC ATTACKS, HX OF 04/17/2009  .  Hyperlipidemia 12/19/2008    Palliative Care Assessment & Plan   Patient Profile: 67 y.o.maleadmitted on 9/22/2019from home with complaints ofshortness of breath and lower extremity swelling. Patient has a past medical history significant for end-stage COPD, chronic bronchitis, congestive heart failure,arthritis, CMML, type 2 diabetes, hypertension, ischemic cardiomyopathy, hyperlipidemia, TIA, and tobacco abuse. Patient is also on 2-3 L of oxygen in the home as well as chronic p.o. prednisone 10 mg daily. During his ED course patient required 4-5 L of oxygen via nasal cannula. He denied chest pain or fever. Wife and granddaughter was at the bedside. Patient was given Lasix IV. His BNP was 1279. Troponin 0 0.04. WBC 12.9. Since admission patient continues on IV Lasix and steroids. He has been seen by pulmonology and cardiology. At this time cardiology is recommending a right and left cardiac cath which will be performed on 9/24. Palliative medicine team consulted for goals of care discussion.  Recommendations/Plan:  DNR/DNI-patient/wife request. Both patient and wife states that patient has a out of facility DNR in the home.  Continue to treat the treatable while hospitalized. S/P cardiac cath.   Patient and wife are requesting outpatient Palliative services at discharge. After further conversations on yesterday, they have decided against hospice services and feels that Palliative and home health is more appropriate and aligns with their wishes.   Case management referral for outpatient Palliative.   Palliative Medicine team will continue to support patient, family, and medical team during hospitalization.  Goals of Care and Additional Recommendations:  Limitations on Scope of Treatment: Full Scope Treatment  Code Status:    Code Status Orders  (From admission, onward)         Start     Ordered   05/03/18 1108  Do not attempt resuscitation (DNR)  Continuous      Question Answer Comment  In the event of cardiac or respiratory ARREST Do not call a "code blue"   In the event of cardiac or respiratory ARREST Do not perform Intubation, CPR, defibrillation or ACLS   In the event of cardiac or respiratory ARREST Use medication by any route, position, wound care, and other measures to relive pain and suffering. May use oxygen, suction and manual treatment of airway obstruction as needed for comfort.      05/03/18 1107        Code Status History    Date Active Date Inactive Code Status Order ID Comments User Context   05/03/2018 0955 05/03/2018 1107 Full Code [id number]  Nelva Bush, MD Inpatient   05/02/2018 1609 05/03/2018 0955 DNR [id number]  Jimmy Footman, NP Inpatient   05/02/2018 1422 05/02/2018 1609 Full Code [id number]  Jimmy Footman, NP Inpatient   05/02/2018 1303 05/02/2018 1422 DNR [id number]  Jimmy Footman, NP Inpatient   05/01/2018 1254 05/02/2018 1303 Full Code [id number]  Nicholes Mango, MD Inpatient   03/24/2018 1948 03/28/2018 1340 Full Code [id number]  Saundra Shelling, MD Inpatient   03/03/2018 0328 03/06/2018 1716 Full Code [id number]  Harrie Foreman, MD Inpatient    Advance Directive Documentation     Most Recent Value  Type of Advance Directive  Out of facility DNR (pink MOST or yellow form)  Pre-existing out of facility DNR order (yellow form or pink MOST form)  -  "MOST" Form in Place?  -     Prognosis:   Unable to determine -guarded to poor in the setting of end-stage COPD, CHF, diabetes, htn, anxiety, CAD, EF 25%, decreased mobility, oxygen use,  shortness of breath, generalized weakness, and acute on chronic hypoxic respiratory failure with acute pulmonary edema.  Discharge Planning:  Home with Home Health and outpatient Palliative.   Care plan was discussed with patient and bedside RN.   Thank you for allowing the Palliative Medicine Team to assist in the care of this patient.   Total Time  35 min.  Prolonged Time Billed NO       Greater than 50%  of this time was spent counseling and coordinating care related to the above assessment and plan.  Alda Lea, AGPCNP-BC Palliative Medicine Team  Pager: [phone] Amion: Bjorn Pippin   Please contact Palliative Medicine Team phone at [phone] for questions and concerns.

## 2018-05-04 NOTE — Progress Notes (Signed)
Progress Note  Patient Name: Tony Watson Date of Encounter: 05/04/2018  Primary Cardiologist: Fletcher Anon  Subjective   Status post [facility] on 9/24 that showed 3-vessel CAD with moderate to severe calcification, as outlined below with medical management being advised at this time. He was noted to have mildly to moderately elevated left heart, right heart, and PA pressures.  Labs this morning show a stable SCr at 0.71, potassium 3.3, WBC 12.1, HGB 12.1, PLT 176. Documented UOP 1 L for the past 24 hours with a net - 5.6 L for the admission. Weight 66.1-->66.1 kg.   Inpatient Medications    Scheduled Meds: . arformoterol  15 mcg Nebulization Q12H  . aspirin  81 mg Oral Daily  . atorvastatin  40 mg Oral q1800  . budesonide  0.5 mg Nebulization BID  . carvedilol  6.25 mg Oral BID WC  . cholecalciferol  2,000 Units Oral QPM  . enoxaparin (LOVENOX) injection  40 mg Subcutaneous Q24H  . feeding supplement (ENSURE ENLIVE)  237 mL Oral BID BM  . furosemide  20 mg Intravenous Q12H  . gabapentin  300 mg Oral QHS  . ipratropium-albuterol  3 mL Nebulization Q6H  . losartan  12.5 mg Oral Daily  . megestrol  40 mg Oral Daily  . methylPREDNISolone (SOLU-MEDROL) injection  60 mg Intravenous Daily  . multivitamin with minerals  1 tablet Oral Daily  . nystatin  5 mL Mouth/Throat QID  . pantoprazole  40 mg Oral QAC breakfast  . potassium chloride  20 mEq Oral Daily  . protein supplement  2-3 scoop Oral TID WC  . sodium chloride flush  3 mL Intravenous Q12H  . sodium chloride flush  3 mL Intravenous Q12H  . tiZANidine  4 mg Oral Nightly  . ascorbic acid  250 mg Oral BID   Continuous Infusions: . sodium chloride    . sodium chloride     PRN Meds: sodium chloride, sodium chloride, acetaminophen, acetaminophen, albuterol, LORazepam, ondansetron (ZOFRAN) IV, oxymetazoline, pseudoephedrine, sodium chloride flush, sodium chloride flush   Vital Signs    Vitals:   05/04/18 0151 05/04/18 0153  05/04/18 0400 05/04/18 0733  BP: (!) 84/51 (!) 87/53 100/66 107/65  Pulse: 74 74 85 94  Resp:   20 18  Temp:   98.5 F (36.9 C) 97.7 F (36.5 C)  TempSrc:    Oral  SpO2: 100% 100% 100% 99%  Weight:   66.1 kg   Height:        Intake/Output Summary (Last 24 hours) at 05/04/2018 0824 Last data filed at 05/04/2018 0742 Gross per 24 hour  Intake 3 ml  Output 950 ml  Net -947 ml   Filed Weights   05/03/18 0329 05/03/18 0821 05/04/18 0400  Weight: 66.1 kg 66.1 kg 66.1 kg    Telemetry    NSR, 90s bpm, occasional PVCs - Personally Reviewed  ECG    n/a - Personally Reviewed  Physical Exam   GEN: No acute distress.   Neck: No JVD. Cardiac: RRR, no murmurs, rubs, or gallops. Cardiac cath site without bleeding, swelling, erythema, or TTP. Radial pulse 2+.  Respiratory: Diminished breath sounds bilaterally with expiratory wheezing.  GI: Soft, nontender, non-distended.   MS: Trace bilateral pre-tibial edema; No deformity. Neuro:  Alert and oriented x 3; Nonfocal.  Psych: Normal affect.  Labs    Chemistry Recent Labs  Lab 05/01/18 1024 05/02/18 0430 05/03/18 0253 05/04/18 0253  NA 134* 135 135 135  K 4.4  3.9 3.5 3.3*  CL 92* 94* 93* 95*  CO2 32 32 33* 29  GLUCOSE 136* 189* 243* 303*  BUN 16 22 23  24*  CREATININE 0.73 0.75 0.70 0.71  CALCIUM 9.1 8.7* 9.0 8.9  PROT 7.8  --   --   --   ALBUMIN 3.8  --   --   --   AST 56*  --   --   --   ALT 70*  --   --   --   ALKPHOS 78  --   --   --   BILITOT 1.0  --   --   --   GFRNONAA >60 >60 >60 >60  GFRAA >60 >60 >60 >60  ANIONGAP 10 9 9 11      Hematology Recent Labs  Lab 05/01/18 1024 05/04/18 0253  WBC 12.9* 12.1*  RBC 4.06* 3.63*  HGB 13.6 12.1*  HCT 39.7* 35.3*  MCV 98.0 97.2  MCH 33.5 33.5  MCHC 34.2 34.4  RDW 17.0* 17.2*  PLT 238 176    Cardiac Enzymes Recent Labs  Lab 05/01/18 1024  TROPONINI 0.04*   No results for input(s): TROPIPOC in the last 168 hours.   BNP Recent Labs  Lab 05/01/18 1024    BNP 1,279.0*     DDimer No results for input(s): DDIMER in the last 168 hours.   Radiology    No results found.  Cardiac Studies   2D Echocardiogram7.25.2019  Study Conclusions  - Left ventricle: The cavity size was mildly dilated. Systolic function was severely reduced. The estimated ejection fraction was in the range of 25% to 30%. Severe diffuse hypokinesis. Anterior wall motion best preserved. The study is not technically sufficient to allow evaluation of LV diastolic function. - Mitral valve: There was mild regurgitation. - Left atrium: The atrium was moderately dilated. - Right ventricle: Systolic function was mildly reduced. - Pulmonary arteries: Systolic pressure could not be accurately estimated. - Inferior vena cava: The vessel was dilated. The respirophasic diameter changes were blunted (<50%), consistent with elevated central venous pressure.  Impressions:  - Normal study. _____________  [facility] 05/03/2018: Conclusions: 1. Three-vessel coronary artery disease with moderate to severe calcification, as outlined above, predominantly affecting the ramus intermedius and RCA (80 to 90% stenoses).  There is moderate LAD disease as well as 80% stenosis of small D2 branch. 2. Patent mid RCA stent with mild in-stent restenosis. 3. Mildly to moderately elevated left heart, right heart, and pulmonary artery pressures. 4. Mildly reduced Fick cardiac output/index.  Recommendations: 1. Aggressive medical therapy.  I recommend at least one more day of IV diuresis. 2. Initiate losartan 12.5 mg daily.  I will decrease carvedilol to 6.25 mg twice daily to minimize the risk for hypotension.  If blood pressure tolerates, the patient could be switched to [facility] in the future. 3. Medical therapy of significant two-vessel CAD.  If patient remains significantly symptomatic despite optimization of his heart failure and underlying lung disease, PCI to the RCA  +/-ramus intermedius could be considered.  RCA stenosis may require atherectomy given significant calcification.  Recommend Aspirin 81mg  daily for moderate to severe multivessel CAD.  __________  Patient Profile     67 y.o. male with history of CAD s/p remote PCI, combined CHF, DM, HTN, HL, and severe COPD, who was admitted 9/22 with respiratory failure.  Assessment & Plan    1. Acute on chronic respiratory failure with hypoxia: -Multifactorial including COPD, ILD, and volume overload -Supportive care -Supplemental oxygen  2. Acute on chronic combined CHF: -Volume improving, though he is still volume up -Echo and R/LHC as above -Documented UOP of 5.6 L for the admission -Continue IV Lasix 20 mg bid for today with possible transition to PO Lasix on 9/26 -Continue Coreg and losartan -Add spironolactone 12.5 mg daily -As an outpatient, consider transition to Entresto  3. CAD/elevated troponin: -Minimal troponin bump of 0.04, not trended without chest -Prior inf infarct in 06/1998 s/p PCI/BMS to RCA. Patent stent on 2004 cath in [place]. No ischemia on MV in 2013 [facility] as above with recommendation for medical management with intervention on the Ramus to be considered for refractory symptoms -Coreg -ASA -Lipitor -Post cath instructions   4. HLD: -Lipitor -Outpatient follow up  5. PSVT: -No further ectopy -Asymptomatic  -Continue Coreg as above -Replete potassium to goal 4.0 (added spironolactone as above)   For questions or updates, please contact [place] Please consult [email or web address] for contact info under Cardiology/STEMI.    Signed, Christell Faith, PA-C [place] Pager: [phone] 05/04/2018, 8:24 AM

## 2018-05-04 NOTE — Plan of Care (Signed)
  Problem: Health Behavior/Discharge Planning: Goal: Ability to manage health-related needs will improve Outcome: Progressing   Problem: Activity: Goal: Risk for activity intolerance will decrease Outcome: Progressing   Problem: Nutrition: Goal: Adequate nutrition will be maintained Outcome: Progressing   Problem: Coping: Goal: Level of anxiety will decrease Outcome: Progressing Note:  Pt had two episodes of anxiety which he received ativan PRN for and once a breathing treatment PRN, both times did relieve anxiety   Problem: Elimination: Goal: Will not experience complications related to urinary retention Outcome: Progressing   Problem: Pain Managment: Goal: General experience of comfort will improve Outcome: Progressing Note:  No complaints of pain this shift   Problem: Activity: Goal: Ability to return to baseline activity level will improve Outcome: Progressing   Problem: Cardiovascular: Goal: Vascular access site(s) Level 0-1 will be maintained Outcome: Completed/Met Note:  Both radial and brachial site are at a level 0, no bleeding/ bruising, dressing clean/dry/intact

## 2018-05-04 NOTE — Plan of Care (Signed)
  Problem: Clinical Measurements: Goal: Will remain free from infection Outcome: Progressing Note:  Remains afebrile Goal: Cardiovascular complication will be avoided Outcome: Progressing Note:  No arrhythmias   Problem: Cardiovascular: Goal: Ability to achieve and maintain adequate cardiovascular perfusion will improve Outcome: Progressing Note:  Cath site clean dry and intact, no arrhythmias overnight   Problem: Clinical Measurements: Goal: Diagnostic test results will improve Outcome: Not Progressing Note:  Potassium 3.3 today replaced po

## 2018-05-05 ENCOUNTER — Other Ambulatory Visit: Payer: Medicare HMO

## 2018-05-05 ENCOUNTER — Inpatient Hospital Stay: Payer: Medicare HMO

## 2018-05-05 ENCOUNTER — Ambulatory Visit: Payer: Medicare HMO | Admitting: Oncology

## 2018-05-05 LAB — GLUCOSE, CAPILLARY
GLUCOSE-CAPILLARY: 147 mg/dL — AB (ref 70–99)
Glucose-Capillary: 226 mg/dL — ABNORMAL HIGH (ref 70–99)
Glucose-Capillary: 304 mg/dL — ABNORMAL HIGH (ref 70–99)
Glucose-Capillary: 155 mg/dL — ABNORMAL HIGH (ref 70–99)

## 2018-05-05 LAB — BASIC METABOLIC PANEL
ANION GAP: 9 (ref 5–15)
BUN: 27 mg/dL — ABNORMAL HIGH (ref 8–23)
CALCIUM: 8.6 mg/dL — AB (ref 8.9–10.3)
CO2: 30 mmol/L (ref 22–32)
Chloride: 98 mmol/L (ref 98–111)
Creatinine, Ser: 0.7 mg/dL (ref 0.61–1.24)
Glucose, Bld: 273 mg/dL — ABNORMAL HIGH (ref 70–99)
Potassium: 3.7 mmol/L (ref 3.5–5.1)
Sodium: 137 mmol/L (ref 135–145)

## 2018-05-05 LAB — MAGNESIUM: MAGNESIUM: 2.1 mg/dL (ref 1.7–2.4)

## 2018-05-05 MED ORDER — PREDNISONE 20 MG PO TABS
30.0000 mg | ORAL_TABLET | Freq: Once | ORAL | Status: AC
  Administered 2018-05-07: 30 mg via ORAL
  Filled 2018-05-05: qty 1

## 2018-05-05 MED ORDER — FUROSEMIDE 10 MG/ML IJ SOLN
20.0000 mg | Freq: Once | INTRAMUSCULAR | Status: AC
  Administered 2018-05-05: 20 mg via INTRAVENOUS
  Filled 2018-05-05: qty 2

## 2018-05-05 MED ORDER — PREDNISONE 20 MG PO TABS
20.0000 mg | ORAL_TABLET | Freq: Once | ORAL | Status: AC
  Administered 2018-05-08: 20 mg via ORAL
  Filled 2018-05-05: qty 1

## 2018-05-05 MED ORDER — IPRATROPIUM-ALBUTEROL 0.5-2.5 (3) MG/3ML IN SOLN
3.0000 mL | Freq: Four times a day (QID) | RESPIRATORY_TRACT | Status: DC | PRN
  Administered 2018-05-05 – 2018-05-08 (×3): 3 mL via RESPIRATORY_TRACT
  Filled 2018-05-05 (×2): qty 3

## 2018-05-05 MED ORDER — PREDNISONE 10 MG PO TABS: 10.0000 mg | ORAL_TABLET | Freq: Once | ORAL | Status: DC

## 2018-05-05 MED ORDER — POTASSIUM CHLORIDE CRYS ER 10 MEQ PO TBCR
10.0000 meq | EXTENDED_RELEASE_TABLET | Freq: Two times a day (BID) | ORAL | Status: AC
  Administered 2018-05-05 (×2): 10 meq via ORAL
  Filled 2018-05-05: qty 1

## 2018-05-05 MED ORDER — PREDNISONE 20 MG PO TABS
40.0000 mg | ORAL_TABLET | Freq: Once | ORAL | Status: AC
  Administered 2018-05-06: 40 mg via ORAL
  Filled 2018-05-05: qty 2

## 2018-05-05 NOTE — Progress Notes (Signed)
[facility] [place] NAME: Drey Shaff    MR#:  [id number]  DATE OF BIRTH:  1950/10/28  SUBJECTIVE:   Having some shortness of breath on exertion.  Still has some lower extremity swelling left greater than right.  No other acute complaints.  REVIEW OF SYSTEMS:    Review of Systems  Constitutional: Negative for chills and fever.  HENT: Negative for congestion and tinnitus.   Eyes: Negative for blurred vision and double vision.  Respiratory: Positive for shortness of breath. Negative for cough and wheezing.   Cardiovascular: Positive for leg swelling. Negative for chest pain, orthopnea and PND.  Gastrointestinal: Negative for abdominal pain, diarrhea, nausea and vomiting.  Genitourinary: Negative for dysuria and hematuria.  Neurological: Negative for dizziness, sensory change and focal weakness.  All other systems reviewed and are negative.   Nutrition: Heart Healthy Tolerating Diet: Yes Tolerating PT: Ambulatory  DRUG ALLERGIES:  No Known Allergies  VITALS:  Blood pressure 101/86, pulse (!) 102, temperature 97.7 F (36.5 C), temperature source Oral, resp. rate 18, height 5\' 11"  (1.803 m), weight 66.3 kg, SpO2 100 %.  PHYSICAL EXAMINATION:   Physical Exam  GENERAL:  67 y.o.-year-old patient lying in bed in no acute distress.  EYES: Pupils equal, round, reactive to light and accommodation. No scleral icterus. Extraocular muscles intact.  HEENT: Head atraumatic, normocephalic. Oropharynx and nasopharynx clear.  NECK:  Supple, + jugular venous distention. No thyroid enlargement, no tenderness.  LUNGS: Normal breath sounds bilaterally, no wheezing, bibasilar rales, rhonchi. No use of accessory muscles of respiration.  CARDIOVASCULAR: S1, S2 normal. No murmurs, rubs, or gallops.  ABDOMEN: Soft, nontender, nondistended. Bowel sounds present. No organomegaly or mass.  EXTREMITIES: No cyanosis, clubbing, +2 edema on LLE > right.    NEUROLOGIC: Cranial nerves II through XII are intact. No focal Motor or sensory deficits b/l.PSYCHIATRIC: The patient is alert and oriented x 3.  SKIN: No obvious rash, lesion, or ulcer.    LABORATORY PANEL:   CBC Recent Labs  Lab 05/04/18 0253  WBC 12.1*  HGB 12.1*  HCT 35.3*  PLT 176   ------------------------------------------------------------------------------------------------------------------  Chemistries  Recent Labs  Lab 05/01/18 1024  05/05/18 0256  NA 134*   < > 137  K 4.4   < > 3.7  CL 92*   < > 98  CO2 32   < > 30  GLUCOSE 136*   < > 273*  BUN 16   < > 27*  CREATININE 0.73   < > 0.70  CALCIUM 9.1   < > 8.6*  MG  --   --  2.1  AST 56*  --   --   ALT 70*  --   --   ALKPHOS 78  --   --   BILITOT 1.0  --   --    < > = values in this interval not displayed.   ------------------------------------------------------------------------------------------------------------------  Cardiac Enzymes Recent Labs  Lab 05/01/18 1024  TROPONINI 0.04*   ------------------------------------------------------------------------------------------------------------------  RADIOLOGY:  No results found.   ASSESSMENT AND PLAN:   67 yo male with past medical history of diabetes, hypertension, coronary artery disease status post stent placement, combined systolic/diastolic CHF, hypertension, hyperlipidemia, medical noncompliance, history of previous CVA who presented to the hospital due to shortness of breath.  1.  Acute on chronic respiratory failure with hypoxia- secondary to CHF/COPD exacerbation. - Continue diuresis with IV Lasix.  D/c IV steroids and switch Oral Pred tomorrow.  No wheezing and bronchospasm and will make Duonebs PRN, cont. pulmicort nebs.   2.  CHF- acute on chronic combined diastolic/systolic dysfunction. -Patient had run out of his Lasix.  Continue diuresis with IV Lasix, patient is responding and feeling better.  About 7.8 L (-) since admission.  As  per Cardiology cont. IV diuresis for 1-2 more days.  - Continue carvedilol, Losartan, Aldactone.    3.  COPD exacerbation-secondary to ongoing tobacco abuse.  Chest x-ray showing no evidence of acute pneumonia. No wheezing/bronchospasm. - d/c IV steroids and will switch to oral pred tomorrow, make duonebs PRN, cont. Pulmicort nebs. Cont. [place]  4.  Ischemic cardiomyopathy- patient has a previous ejection fraction of 45%.  Repeat echo during this hospitalization showing significant reduction in ejection fraction of 15 to 20%.  Status post cardiac catheterization showing Three-vessel coronary artery disease with moderate to severe calcification, as outlined above, predominantly affecting the ramus intermedius and RCA (80 to 90% stenoses).  There is moderate LAD disease as well as 80% stenosis of small D2 branch. Patent mid RCA stent with mild in-stent restenosis. - Cardiology recommending medical management for significant two-vessel CAD. - Continue IV diuresis, Coreg, Losartan, Aldactone.  Improving  5.  GERD-continue Protonix.  Possible discharge tomorrow if doing well.   All the records are reviewed and case discussed with Care Management/Social Worker. Management plans discussed with the patient, family and they are in agreement.  CODE STATUS: DNR  DVT Prophylaxis: Lovenox  TOTAL TIME TAKING CARE OF THIS PATIENT: 30 minutes.   POSSIBLE D/C IN 1-2 DAYS, DEPENDING ON CLINICAL CONDITION.   Henreitta Leber M.D on 05/05/2018 at 2:51 PM  Between 7am to 6pm - Pager - [phone]  After 6pm go to [email or web address] - Proofreader  [facility]  Office  [phone]  CC: Primary care physician; Owens Loffler, MD

## 2018-05-05 NOTE — Progress Notes (Signed)
Progress Note  Patient Name: Tony Watson Date of Encounter: 05/05/2018  Primary Cardiologist: Fletcher Anon  Subjective   Status post [facility] on 9/24 that showed 3-vessel CAD with moderate to severe calcification, as outlined below with medical management being advised at this time. He was noted to have mildly to moderately elevated left heart, right heart, and PA pressures.  SOB continues to improve, less orthopneic. Continues to note lower extremity swelling. No chest pain. SCr remains stable at 0.70 with K+ improved to 3.7. Documented UOP of 2.1 L for the past 24 hours with a net - 7.5 L for the admission. Weight 66.1-->66.3 kg. Remains on IV Lasix 20 mg bid. BP in the low 086V systolic. Coreg has been reduced to 6.25 mg bid given hypotension with SBP in the 80s on 9/25. Remains on losartan 12.5 mg and spironolactone 12.5 mg daily.   Inpatient Medications    Scheduled Meds: . arformoterol  15 mcg Nebulization Q12H  . aspirin  81 mg Oral Daily  . atorvastatin  40 mg Oral q1800  . budesonide  0.5 mg Nebulization BID  . carvedilol  6.25 mg Oral BID WC  . cholecalciferol  2,000 Units Oral QPM  . docusate sodium  100 mg Oral BID  . enoxaparin (LOVENOX) injection  40 mg Subcutaneous Q24H  . feeding supplement (ENSURE ENLIVE)  237 mL Oral BID BM  . furosemide  20 mg Intravenous Q12H  . gabapentin  300 mg Oral QHS  . insulin aspart  0-5 Units Subcutaneous QHS  . insulin aspart  0-9 Units Subcutaneous TID WC  . ipratropium-albuterol  3 mL Nebulization Q6H  . losartan  12.5 mg Oral Daily  . megestrol  40 mg Oral Daily  . methylPREDNISolone (SOLU-MEDROL) injection  60 mg Intravenous Daily  . multivitamin with minerals  1 tablet Oral Daily  . nystatin  5 mL Mouth/Throat QID  . pantoprazole  40 mg Oral QAC breakfast  . potassium chloride  30 mEq Oral BID  . protein supplement  2-3 scoop Oral TID WC  . sodium chloride flush  3 mL Intravenous Q12H  . sodium chloride flush  3 mL Intravenous  Q12H  . spironolactone  12.5 mg Oral Daily  . tiZANidine  4 mg Oral Nightly  . ascorbic acid  250 mg Oral BID   Continuous Infusions: . sodium chloride    . sodium chloride     PRN Meds: sodium chloride, sodium chloride, acetaminophen, acetaminophen, albuterol, LORazepam, ondansetron (ZOFRAN) IV, oxymetazoline, pseudoephedrine, sodium chloride flush, sodium chloride flush   Vital Signs    Vitals:   05/05/18 0633 05/05/18 0717 05/05/18 0732 05/05/18 0742  BP: 108/74 101/86    Pulse: 99 (!) 102    Resp:  18    Temp:  97.7 F (36.5 C)    TempSrc:  Oral    SpO2: 100% 99% 100% 100%  Weight:      Height:        Intake/Output Summary (Last 24 hours) at 05/05/2018 0807 Last data filed at 05/05/2018 0635 Gross per 24 hour  Intake 6 ml  Output 1960 ml  Net -1954 ml   Filed Weights   05/03/18 0821 05/04/18 0400 05/05/18 0220  Weight: 66.1 kg 66.1 kg 66.3 kg    Telemetry    Sinus rhythm to sinus tachycardia, 90s to low 100s bpm - Personally Reviewed  ECG    n/a - Personally Reviewed  Physical Exam   GEN: No acute distress.  Neck: JVD elevated ~ 12 cm. Cardiac: RRR, no murmurs, rubs, or gallops.  Respiratory: Diminished breath sounds bilaterally with diffuse expiratory wheezing and crackles along the bilateral bases. GI: Soft, nontender, non-distended.   MS: 1+ bilateral lower extremity pitting edema; No deformity. Neuro:  Alert and oriented x 3; Nonfocal.  Psych: Normal affect.  Labs    Chemistry Recent Labs  Lab 05/01/18 1024  05/03/18 0253 05/04/18 0253 05/05/18 0256  NA 134*   < > 135 135 137  K 4.4   < > 3.5 3.3* 3.7  CL 92*   < > 93* 95* 98  CO2 32   < > 33* 29 30  GLUCOSE 136*   < > 243* 303* 273*  BUN 16   < > 23 24* 27*  CREATININE 0.73   < > 0.70 0.71 0.70  CALCIUM 9.1   < > 9.0 8.9 8.6*  PROT 7.8  --   --   --   --   ALBUMIN 3.8  --   --   --   --   AST 56*  --   --   --   --   ALT 70*  --   --   --   --   ALKPHOS 78  --   --   --   --     BILITOT 1.0  --   --   --   --   GFRNONAA >60   < > >60 >60 >60  GFRAA >60   < > >60 >60 >60  ANIONGAP 10   < > 9 11 9    < > = values in this interval not displayed.     Hematology Recent Labs  Lab 05/01/18 1024 05/04/18 0253  WBC 12.9* 12.1*  RBC 4.06* 3.63*  HGB 13.6 12.1*  HCT 39.7* 35.3*  MCV 98.0 97.2  MCH 33.5 33.5  MCHC 34.2 34.4  RDW 17.0* 17.2*  PLT 238 176    Cardiac Enzymes Recent Labs  Lab 05/01/18 1024  TROPONINI 0.04*   No results for input(s): TROPIPOC in the last 168 hours.   BNP Recent Labs  Lab 05/01/18 1024  BNP 1,279.0*     DDimer No results for input(s): DDIMER in the last 168 hours.   Radiology    No results found.  Cardiac Studies   2D Echocardiogram7.25.2019  Study Conclusions  - Left ventricle: The cavity size was mildly dilated. Systolic function was severely reduced. The estimated ejection fraction was in the range of 25% to 30%. Severe diffuse hypokinesis. Anterior wall motion best preserved. The study is not technically sufficient to allow evaluation of LV diastolic function. - Mitral valve: There was mild regurgitation. - Left atrium: The atrium was moderately dilated. - Right ventricle: Systolic function was mildly reduced. - Pulmonary arteries: Systolic pressure could not be accurately estimated. - Inferior vena cava: The vessel was dilated. The respirophasic diameter changes were blunted (<50%), consistent with elevated central venous pressure.  Impressions:  - Normal study. _____________  [facility] 05/03/2018: Conclusions: 1. Three-vessel coronary artery disease with moderate to severe calcification, as outlined above, predominantly affecting the ramus intermedius and RCA (80 to 90% stenoses). There is moderate LAD disease as well as 80% stenosis of small D2 branch. 2. Patent mid RCA stent with mild in-stent restenosis. 3. Mildly to moderately elevated left heart, right heart, and pulmonary  artery pressures. 4. Mildly reduced Fick cardiac output/index.  Recommendations: 1. Aggressive medical therapy. I recommend at least one  more day of IV diuresis. 2. Initiate losartan 12.5 mg daily. I will decrease carvedilol to 6.25 mg twice daily to minimize the risk for hypotension. If blood pressure tolerates, the patient could be switched to [facility] in the future. 3. Medical therapy of significant two-vessel CAD. If patient remains significantly symptomatic despite optimization of his heart failure and underlying lung disease, PCI to the RCA +/-ramus intermedius could be considered. RCA stenosis may require atherectomy given significant calcification.  Recommend Aspirin 81mg  daily for moderate to severe multivessel CAD. __________   Patient Profile     67 y.o. male with history of CADs/p remote PCI, combined CHF, DM, HTN, HL, and severe COPD, who was admitted 9/22 with respiratory failure.  Assessment & Plan    1. Acute on chronic respiratory failure with hypoxia: -Multifactorial including COPD, ILD, and volume overload -Supportive care -Supplemental oxygen  2. Acute on chronic combined CHF: -Volume improving, though he is still volume up -Echo and R/LHC as above -Documented UOP of 7.5 L for the admission -Increase IV Lasix to 20 mg tid for today with possible transition to PO Lasix on 9/27 -Would continue to IV diurese given his stable renal function and he continues to appear volume up on exam -Continue reduced dose Coreg secondary to relative hypotension, losartan, and spironolactone -As an outpatient, consider transition to Entresto, if BP allows  3. CAD/elevated troponin: -Minimal troponin bump of 0.04, not trended without chest -Prior inf infarct in 11/1999s/pPCI/BMS to RCA. Patent stent on 2004 cath in [place]. No ischemia on MV in 2013 [facility] as above with recommendation for medical management with intervention on the Ramus to be considered for refractory  symptoms -Coreg -ASA -Lipitor -Post cath instructions   4. HLD: -Lipitor -Outpatient follow up  5. PSVT: -No further ectopy -Asymptomatic  -Continue Coreg as above -Replete potassium to goal 4.0    For questions or updates, please contact [facility] Please consult [email or web address] for contact info under Cardiology/STEMI.    Signed, Christell Faith, PA-C [place] Pager: [phone] 05/05/2018, 8:07 AM

## 2018-05-05 NOTE — Progress Notes (Signed)
   05/05/18 0930  Clinical Encounter Type  Visited With Patient  Visit Type Initial;Spiritual support  Referral From Nurse  Consult/Referral To [place] (Comment)   [place] received a page from the unit clerk that Tony Watson was ready to complete his AD. I secured two witnesses and the notary with the help of [place]. The AD was completed and a copy was put into the patient's chart.

## 2018-05-06 LAB — BASIC METABOLIC PANEL
ANION GAP: 9 (ref 5–15)
BUN: 28 mg/dL — ABNORMAL HIGH (ref 8–23)
CALCIUM: 8.9 mg/dL (ref 8.9–10.3)
CO2: 30 mmol/L (ref 22–32)
Chloride: 94 mmol/L — ABNORMAL LOW (ref 98–111)
Creatinine, Ser: 0.68 mg/dL (ref 0.61–1.24)
Glucose, Bld: 286 mg/dL — ABNORMAL HIGH (ref 70–99)
POTASSIUM: 3.9 mmol/L (ref 3.5–5.1)
SODIUM: 133 mmol/L — AB (ref 135–145)

## 2018-05-06 LAB — GLUCOSE, CAPILLARY
GLUCOSE-CAPILLARY: 251 mg/dL — AB (ref 70–99)
Glucose-Capillary: 138 mg/dL — ABNORMAL HIGH (ref 70–99)
Glucose-Capillary: 299 mg/dL — ABNORMAL HIGH (ref 70–99)
Glucose-Capillary: 121 mg/dL — ABNORMAL HIGH (ref 70–99)

## 2018-05-06 MED ORDER — FUROSEMIDE 10 MG/ML IJ SOLN
20.0000 mg | Freq: Three times a day (TID) | INTRAMUSCULAR | Status: DC
  Administered 2018-05-06 – 2018-05-08 (×6): 20 mg via INTRAVENOUS
  Filled 2018-05-06 (×6): qty 2

## 2018-05-06 NOTE — Care Management Important Message (Signed)
Important Message  Patient Details  Name: Tony Watson MRN: [id number] Date of Birth: 24-Jan-1951   Medicare Important Message Given:  Yes    Juliann Pulse A Jeromiah Ohalloran 05/06/2018, 12:06 PM

## 2018-05-06 NOTE — Progress Notes (Signed)
[place] [facility] [place] NAME: Tony Watson    MR#:  [id number]  DATE OF BIRTH:  1951/02/25  SUBJECTIVE:   Patient overall feels much better, but continues to have significant lower extremity edema.  Cardiology recommending further diuresis with IV Lasix.  Patient denies any chest pain or any other associated symptoms.  REVIEW OF SYSTEMS:    Review of Systems  Constitutional: Negative for chills and fever.  HENT: Negative for congestion and tinnitus.   Eyes: Negative for blurred vision and double vision.  Respiratory: Positive for shortness of breath. Negative for cough and wheezing.   Cardiovascular: Positive for leg swelling. Negative for chest pain, orthopnea and PND.  Gastrointestinal: Negative for abdominal pain, diarrhea, nausea and vomiting.  Genitourinary: Negative for dysuria and hematuria.  Neurological: Negative for dizziness, sensory change and focal weakness.  All other systems reviewed and are negative.   Nutrition: Heart Healthy Tolerating Diet: Yes Tolerating PT: Ambulatory  DRUG ALLERGIES:  No Known Allergies  VITALS:  Blood pressure (!) 121/91, pulse 96, temperature 97.8 F (36.6 C), temperature source Oral, resp. rate 18, height 5\' 11"  (1.803 m), weight 65.3 kg, SpO2 99 %.  PHYSICAL EXAMINATION:   Physical Exam  GENERAL:  67 y.o.-year-old patient lying in bed in no acute distress.  EYES: Pupils equal, round, reactive to light and accommodation. No scleral icterus. Extraocular muscles intact.  HEENT: Head atraumatic, normocephalic. Oropharynx and nasopharynx clear.  NECK:  Supple, + jugular venous distention. No thyroid enlargement, no tenderness.  LUNGS: Normal breath sounds bilaterally, no wheezing, bibasilar rales, rhonchi. No use of accessory muscles of respiration.  CARDIOVASCULAR: S1, S2 normal. No murmurs, rubs, or gallops.  ABDOMEN: Soft, nontender, nondistended. Bowel sounds present. No organomegaly or  mass.  EXTREMITIES: No cyanosis, clubbing, +2 edema on LLE > right.   NEUROLOGIC: Cranial nerves II through XII are intact. No focal Motor or sensory deficits b/l.PSYCHIATRIC: The patient is alert and oriented x 3.  SKIN: No obvious rash, lesion, or ulcer.    LABORATORY PANEL:   CBC Recent Labs  Lab 05/04/18 0253  WBC 12.1*  HGB 12.1*  HCT 35.3*  PLT 176   ------------------------------------------------------------------------------------------------------------------  Chemistries  Recent Labs  Lab 05/01/18 1024  05/05/18 0256 05/06/18 0229  NA 134*   < > 137 133*  K 4.4   < > 3.7 3.9  CL 92*   < > 98 94*  CO2 32   < > 30 30  GLUCOSE 136*   < > 273* 286*  BUN 16   < > 27* 28*  CREATININE 0.73   < > 0.70 0.68  CALCIUM 9.1   < > 8.6* 8.9  MG  --   --  2.1  --   AST 56*  --   --   --   ALT 70*  --   --   --   ALKPHOS 78  --   --   --   BILITOT 1.0  --   --   --    < > = values in this interval not displayed.   ------------------------------------------------------------------------------------------------------------------  Cardiac Enzymes Recent Labs  Lab 05/01/18 1024  TROPONINI 0.04*   ------------------------------------------------------------------------------------------------------------------  RADIOLOGY:  No results found.   ASSESSMENT AND PLAN:   67 yo male with past medical history of diabetes, hypertension, coronary artery disease status post stent placement, combined systolic/diastolic CHF, hypertension, hyperlipidemia, medical noncompliance, history of previous CVA who presented to the hospital due  to shortness of breath.  1.  Acute on chronic respiratory failure with hypoxia- secondary to CHF/COPD exacerbation. - Continue diuresis with IV Lasix.  Off IV steroids and cont. Pred. taper.  No wheezing and bronchospasm.  Cont. PRN duonebs, Pulmicort nebs.  - much improved.   2.  CHF- acute on chronic combined diastolic/systolic  dysfunction. -Patient had run out of his Lasix.  Continue diuresis with IV Lasix, patient is responding and feeling better.  About 12 L (-) since admission.  As per Cardiology cont. IV diuresis for 1-2 more days.  - Continue carvedilol, Losartan, Aldactone.    3.  COPD exacerbation-secondary to ongoing tobacco abuse.  Chest x-ray showing no evidence of acute pneumonia. No wheezing/bronchospasm. - Off IV steroids, continue to finish prednisone taper, duonebs PRN, cont. Pulmicort nebs. Cont. Brovana  4.  Ischemic cardiomyopathy- patient has a previous ejection fraction of 45%.  Repeat echo during this hospitalization showing significant reduction in ejection fraction of 15 to 20%.  Status post cardiac catheterization showing Three-vessel coronary artery disease with moderate to severe calcification, as outlined above, predominantly affecting the ramus intermedius and RCA (80 to 90% stenoses).  There is moderate LAD disease as well as 80% stenosis of small D2 branch. Patent mid RCA stent with mild in-stent restenosis. - Cardiology recommending medical management for significant two-vessel CAD. - Continue IV diuresis, Coreg, Losartan, Aldactone.  Improving  5.  GERD-continue Protonix.  6. Oral Thrush - started on Nystatin Swish and swallow.   7. Hyperlipidemia - cont. Atorvastatin.   All the records are reviewed and case discussed with Care Management/Social Worker. Management plans discussed with the patient, family and they are in agreement.  CODE STATUS: DNR  DVT Prophylaxis: Lovenox  TOTAL TIME TAKING CARE OF THIS PATIENT: 30 minutes.   POSSIBLE D/C IN 1-2 DAYS, DEPENDING ON CLINICAL CONDITION.   Henreitta Leber M.D on 05/06/2018 at 2:24 PM  Between 7am to 6pm - Pager - [phone]  After 6pm go to [email or web address] - Proofreader  [employer]  [facility]  Office  [phone]  CC: Primary care physician; Owens Loffler, MD

## 2018-05-06 NOTE — Progress Notes (Signed)
Daily Progress Note   Patient Name: Tony Watson       Date: 05/06/2018 DOB: 10/11/1950  Age: 67 y.o. MRN#: [id number] Attending Physician: Henreitta Leber, MD Primary Care Physician: Owens Loffler, MD Admit Date: 05/01/2018  Reason for Consultation/Follow-up: Establishing goals of care  Subjective: Patient sitting up on side of bed watching tv. He continues to report shortness of breath with more of an increase with exertion. Denies Pain. Bilateral lower extremity edema with TED hose in place. Patient states he is hopeful to go home tomorrow as he has family coming in to visit from [facility], who were expecting him to be home today.   Patient expresses goals remain in place. His wishes is to be discharged home with home health services and outpatient Palliative. Again verbalizes that he already has a DNR/DNI in place at home.   Chart Reviewed.   Length of Stay: 5  Current Medications: Scheduled Meds:  . arformoterol  15 mcg Nebulization Q12H  . aspirin  81 mg Oral Daily  . atorvastatin  40 mg Oral q1800  . budesonide  0.5 mg Nebulization BID  . carvedilol  6.25 mg Oral BID WC  . cholecalciferol  2,000 Units Oral QPM  . docusate sodium  100 mg Oral BID  . enoxaparin (LOVENOX) injection  40 mg Subcutaneous Q24H  . feeding supplement (ENSURE ENLIVE)  237 mL Oral BID BM  . furosemide  20 mg Intravenous Q8H  . gabapentin  300 mg Oral QHS  . insulin aspart  0-5 Units Subcutaneous QHS  . insulin aspart  0-9 Units Subcutaneous TID WC  . losartan  12.5 mg Oral Daily  . megestrol  40 mg Oral Daily  . multivitamin with minerals  1 tablet Oral Daily  . nystatin  5 mL Mouth/Throat QID  . pantoprazole  40 mg Oral QAC breakfast  . [START ON 05/07/2018] predniSONE  30 mg Oral Once     Followed by  . [START ON 05/08/2018] predniSONE  20 mg Oral Once   Followed by  . [START ON 05/09/2018] predniSONE  10 mg Oral Once  . protein supplement  2-3 scoop Oral TID WC  . sodium chloride flush  3 mL Intravenous Q12H  . sodium chloride flush  3 mL Intravenous Q12H  . spironolactone  12.5  mg Oral Daily  . tiZANidine  4 mg Oral Nightly  . ascorbic acid  250 mg Oral BID    Continuous Infusions: . sodium chloride    . sodium chloride      PRN Meds: sodium chloride, sodium chloride, acetaminophen, acetaminophen, albuterol, ipratropium-albuterol, LORazepam, ondansetron (ZOFRAN) IV, oxymetazoline, pseudoephedrine, sodium chloride flush, sodium chloride flush  Physical Exam  Constitutional: He is oriented to person, place, and time. Vital signs are normal. He is cooperative. He appears ill.  Thin, frail, and chronically ill appearing   Cardiovascular: Normal rate, regular rhythm, normal heart sounds and normal pulses.  Bilateral lower edema   Pulmonary/Chest: He has decreased breath sounds.  Shortness of breath, cough   Abdominal: Bowel sounds are normal.  Musculoskeletal:  Generalized weakness   Neurological: He is alert and oriented to person, place, and time.  Skin: Skin is warm, dry and intact. Bruising noted.  Psychiatric: Judgment normal. Cognition and memory are normal.  Nursing note and vitals reviewed.           Vital Signs: BP (!) 121/91 (BP Location: Right Arm)   Pulse 96   Temp 97.8 F (36.6 C) (Oral)   Resp 18   Ht 5\' 11"  (1.803 m)   Wt 65.3 kg   SpO2 99%   BMI 20.08 kg/m  SpO2: SpO2: 99 % O2 Device: O2 Device: Nasal Cannula O2 Flow Rate: O2 Flow Rate (L/min): 4 L/min  Intake/output summary:   Intake/Output Summary (Last 24 hours) at 05/06/2018 1036 Last data filed at 05/06/2018 0836 Gross per 24 hour  Intake -  Output 3475 ml  Net -3475 ml   LBM: Last BM Date: 11/01/17 Baseline Weight: Weight: 68.4 kg Most recent weight: Weight: 65.3 kg       Palliative Assessment/Data: PPS 40%   Flowsheet Rows     Most Recent Value  Intake Tab  Referral Department  Hospitalist  Unit at Time of Referral  Med/Surg Unit  Palliative Care Primary Diagnosis  Pulmonary  Date Notified  05/01/18  Palliative Care Type  New Palliative care  Reason for referral  Clarify Goals of Care  Date of Admission  05/01/18  Date first seen by Palliative Care  05/02/18  # of days Palliative referral response time  1 Day(s)  # of days IP prior to Palliative referral  0  Clinical Assessment  Psychosocial & Spiritual Assessment  Palliative Care Outcomes      Patient Active Problem List   Diagnosis Date Noted  . Acute on chronic systolic heart failure ([place])   . ILD (interstitial lung disease) ([place])   . DNR / DNI, Advanced Directives Counselling 04/27/2018 04/28/2018    Class: Acute  . Ischemic cardiomyopathy 03/24/2018  . COPD with acute exacerbation ([place]) 03/24/2018  . Respiratory failure with hypoxia ([place]) 03/03/2018  . Protein-calorie malnutrition, severe 03/03/2018  . 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 Diabetic autonomic neuropathy associated with type 2 diabetes mellitus ([place]) 03/27/2015  . History of inferior MI (myocardial infarction) 10/14/2013  . CAD (coronary artery disease)   . Hypertensive heart disease   . History of noncompliance with medical treatment 05/30/2012  . Systolic heart failure secondary to coronary artery disease ([place]) 05/30/2012  . Severe chronic obstructive pulmonary disease ([place]) 03/23/2011  . Other emphysema ([place]) 03/23/2011  . ALLERGIC RHINITIS CAUSE UNSPECIFIED 07/31/2010  . Type 2 diabetes mellitus with vascular disease ([place]) 01/02/2010  . TOBACCO USE 04/17/2009  . TRANSIENT ISCHEMIC ATTACKS, HX  OF 04/17/2009  . Hyperlipidemia 12/19/2008    Palliative Care Assessment & Plan   Patient Profile: 67 y.o.maleadmitted on 9/22/2019from home with complaints ofshortness of  breath and lower extremity swelling. Patient has a past medical history significant for end-stage COPD, chronic bronchitis, congestive heart failure,arthritis, CMML, type 2 diabetes, hypertension, ischemic cardiomyopathy, hyperlipidemia, TIA, and tobacco abuse. Patient is also on 2-3 L of oxygen in the home as well as chronic p.o. prednisone 10 mg daily. During his ED course patient required 4-5 L of oxygen via nasal cannula. He denied chest pain or fever. Wife and granddaughter was at the bedside. Patient was given Lasix IV. His BNP was 1279. Troponin 0 0.04. WBC 12.9. Since admission patient continues on IV Lasix and steroids. He has been seen by pulmonology and cardiology. At this time cardiology is recommending a right and left cardiac cath which will be performed on 9/24. Palliative medicine team consulted for goals of care discussion.   Recommendations/Plan:  DNR/DNI-patient/wife request. Both patient and wife states that patient has a out of facility DNR in the home.  Continue to treat the treatable while hospitalized.S/P cardiac cath.  Patient and wife are requesting outpatient Palliative services at discharge.    Case management aware of outpatient palliative needs.   Palliative Medicine team will continue to support patient, family, and medical team during hospitalization.  Goals of Care and Additional Recommendations:  Limitations on Scope of Treatment: Full Scope Treatment  Code Status:    Code Status Orders  (From admission, onward)         Start     Ordered   05/03/18 1108  Do not attempt resuscitation (DNR)  Continuous    Question Answer Comment  In the event of cardiac or respiratory ARREST Do not call a "code blue"   In the event of cardiac or respiratory ARREST Do not perform Intubation, CPR, defibrillation or ACLS   In the event of cardiac or respiratory ARREST Use medication by any route, position, wound care, and other measures to relive pain  and suffering. May use oxygen, suction and manual treatment of airway obstruction as needed for comfort.      05/03/18 1107        Code Status History    Date Active Date Inactive Code Status Order ID Comments User Context   05/03/2018 0955 05/03/2018 1107 Full Code [id number]  Nelva Bush, MD Inpatient   05/02/2018 1609 05/03/2018 0955 DNR [id number]  Jimmy Footman, NP Inpatient   05/02/2018 1422 05/02/2018 1609 Full Code [id number]  Jimmy Footman, NP Inpatient   05/02/2018 1303 05/02/2018 1422 DNR [id number]  Jimmy Footman, NP Inpatient   05/01/2018 1254 05/02/2018 1303 Full Code [id number]  Nicholes Mango, MD Inpatient   03/24/2018 1948 03/28/2018 1340 Full Code [id number]  Saundra Shelling, MD Inpatient   03/03/2018 0328 03/06/2018 1716 Full Code [id number]  Harrie Foreman, MD Inpatient    Advance Directive Documentation     Most Recent Value  Type of Advance Directive  Out of facility DNR (pink MOST or yellow form)  Pre-existing out of facility DNR order (yellow form or pink MOST form)  -  "MOST" Form in Place?  -      Prognosis:   Unable to determine guarded to poor in the setting of end-stage COPD, CHF, diabetes, htn, anxiety, CAD, EF 25%, decreased mobility, oxygen use, shortness of breath, generalized weakness, and acute on chronic hypoxic respiratory failure with acute pulmonary edema.  Discharge Planning:  Home with Home Health and outpatient Palliative.   Care plan was discussed with patient, patient's wife, and bedside RN.   Thank you for allowing the Palliative Medicine Team to assist in the care of this patient.   Total Time 35 min Prolonged Time Billed  NO        Greater than 50%  of this time was spent counseling and coordinating care related to the above assessment and plan.  Alda Lea, AGPCNP-BC Palliative Medicine Team  Phone: [phone] Fax: [phone] Pager: [phone] Amion: Bjorn Pippin   Please  contact Palliative Medicine Team phone at [phone] for questions and concerns.

## 2018-05-06 NOTE — Progress Notes (Signed)
Progress Note  Patient Name: Tony Watson Date of Encounter: 05/06/2018  Primary Cardiologist: Fletcher Anon  Subjective   Status post [facility] on 9/24 that showed 3-vessel CAD with moderate to severe calcification, as outlined below with medical management being advised at this time. He was noted to have mildly to moderately elevated left heart, right heart, and PA pressures.  Still SOB, leg edema 3 liters neg yesterday  Inpatient Medications    Scheduled Meds: . arformoterol  15 mcg Nebulization Q12H  . aspirin  81 mg Oral Daily  . atorvastatin  40 mg Oral q1800  . budesonide  0.5 mg Nebulization BID  . carvedilol  6.25 mg Oral BID WC  . cholecalciferol  2,000 Units Oral QPM  . docusate sodium  100 mg Oral BID  . enoxaparin (LOVENOX) injection  40 mg Subcutaneous Q24H  . feeding supplement (ENSURE ENLIVE)  237 mL Oral BID BM  . furosemide  20 mg Intravenous Q12H  . gabapentin  300 mg Oral QHS  . insulin aspart  0-5 Units Subcutaneous QHS  . insulin aspart  0-9 Units Subcutaneous TID WC  . losartan  12.5 mg Oral Daily  . megestrol  40 mg Oral Daily  . multivitamin with minerals  1 tablet Oral Daily  . nystatin  5 mL Mouth/Throat QID  . pantoprazole  40 mg Oral QAC breakfast  . [START ON 05/07/2018] predniSONE  30 mg Oral Once   Followed by  . [START ON 05/08/2018] predniSONE  20 mg Oral Once   Followed by  . [START ON 05/09/2018] predniSONE  10 mg Oral Once  . protein supplement  2-3 scoop Oral TID WC  . sodium chloride flush  3 mL Intravenous Q12H  . sodium chloride flush  3 mL Intravenous Q12H  . spironolactone  12.5 mg Oral Daily  . tiZANidine  4 mg Oral Nightly  . ascorbic acid  250 mg Oral BID   Continuous Infusions: . sodium chloride    . sodium chloride     PRN Meds: sodium chloride, sodium chloride, acetaminophen, acetaminophen, albuterol, ipratropium-albuterol, LORazepam, ondansetron (ZOFRAN) IV, oxymetazoline, pseudoephedrine, sodium chloride flush, sodium  chloride flush   Vital Signs    Vitals:   05/06/18 0700 05/06/18 0718 05/06/18 0746 05/06/18 0755  BP:  (!) 121/91    Pulse:  96    Resp:  18    Temp:  97.8 F (36.6 C)    TempSrc:  Oral    SpO2:  99% 99% 99%  Weight: 65.3 kg     Height:        Intake/Output Summary (Last 24 hours) at 05/06/2018 1030 Last data filed at 05/06/2018 0836 Gross per 24 hour  Intake -  Output 3475 ml  Net -3475 ml   Filed Weights   05/04/18 0400 05/05/18 0220 05/06/18 0700  Weight: 66.1 kg 66.3 kg 65.3 kg    Telemetry    Sinus rhythm- Personally Reviewed  ECG    n/a - Personally Reviewed  Physical Exam   Constitutional:  oriented to person, place, and time. No distress. thin HENT:  Head: Normocephalic and atraumatic.  Eyes:  no discharge. No scleral icterus.  Neck: Normal range of motion. Neck supple. No JVD present.  Cardiovascular: Normal rate, regular rhythm, normal heart sounds and intact distal pulses. Exam reveals no gallop and no friction rub. 1+ pitting edema to knees No murmur heard. Pulmonary/Chest: Effort normal and breath sounds normal. No stridor. No respiratory distress.  no  wheezes.  no rales.  no tenderness.  Abdominal: Soft.  no distension.  no tenderness.  Musculoskeletal: Normal range of motion.  no  tenderness or deformity.  Neurological:  normal muscle tone. Coordination normal. No atrophy Skin: Skin is warm and dry. No rash noted. not diaphoretic.  Psychiatric:  normal mood and affect. behavior is normal. Thought content normal.    Labs    Chemistry Recent Labs  Lab 05/01/18 1024  05/04/18 0253 05/05/18 0256 05/06/18 0229  NA 134*   < > 135 137 133*  K 4.4   < > 3.3* 3.7 3.9  CL 92*   < > 95* 98 94*  CO2 32   < > 29 30 30   GLUCOSE 136*   < > 303* 273* 286*  BUN 16   < > 24* 27* 28*  CREATININE 0.73   < > 0.71 0.70 0.68  CALCIUM 9.1   < > 8.9 8.6* 8.9  PROT 7.8  --   --   --   --   ALBUMIN 3.8  --   --   --   --   AST 56*  --   --   --   --   ALT  70*  --   --   --   --   ALKPHOS 78  --   --   --   --   BILITOT 1.0  --   --   --   --   GFRNONAA >60   < > >60 >60 >60  GFRAA >60   < > >60 >60 >60  ANIONGAP 10   < > 11 9 9    < > = values in this interval not displayed.     Hematology Recent Labs  Lab 05/01/18 1024 05/04/18 0253  WBC 12.9* 12.1*  RBC 4.06* 3.63*  HGB 13.6 12.1*  HCT 39.7* 35.3*  MCV 98.0 97.2  MCH 33.5 33.5  MCHC 34.2 34.4  RDW 17.0* 17.2*  PLT 238 176    Cardiac Enzymes Recent Labs  Lab 05/01/18 1024  TROPONINI 0.04*   No results for input(s): TROPIPOC in the last 168 hours.   BNP Recent Labs  Lab 05/01/18 1024  BNP 1,279.0*     DDimer No results for input(s): DDIMER in the last 168 hours.   Radiology    No results found.  Cardiac Studies   2D Echocardiogram7.25.2019  Study Conclusions  - Left ventricle: The cavity size was mildly dilated. Systolic function was severely reduced. The estimated ejection fraction was in the range of 25% to 30%. Severe diffuse hypokinesis. Anterior wall motion best preserved. The study is not technically sufficient to allow evaluation of LV diastolic function. - Mitral valve: There was mild regurgitation. - Left atrium: The atrium was moderately dilated. - Right ventricle: Systolic function was mildly reduced. - Pulmonary arteries: Systolic pressure could not be accurately estimated. - Inferior vena cava: The vessel was dilated. The respirophasic diameter changes were blunted (<50%), consistent with elevated central venous pressure.  Impressions:  - Normal study. _____________  [facility] 05/03/2018: Conclusions: 1. Three-vessel coronary artery disease with moderate to severe calcification, as outlined above, predominantly affecting the ramus intermedius and RCA (80 to 90% stenoses). There is moderate LAD disease as well as 80% stenosis of small D2 branch. 2. Patent mid RCA stent with mild in-stent restenosis. 3. Mildly to  moderately elevated left heart, right heart, and pulmonary artery pressures. 4. Mildly reduced Fick cardiac output/index.  Recommendations: 1. Aggressive  medical therapy. I recommend at least one more day of IV diuresis. 2. Initiate losartan 12.5 mg daily. I will decrease carvedilol to 6.25 mg twice daily to minimize the risk for hypotension. If blood pressure tolerates, the patient could be switched to [facility] in the future. 3. Medical therapy of significant two-vessel CAD. If patient remains significantly symptomatic despite optimization of his heart failure and underlying lung disease, PCI to the RCA +/-ramus intermedius could be considered. RCA stenosis may require atherectomy given significant calcification.  Recommend Aspirin 81mg  daily for moderate to severe multivessel CAD. __________   Patient Profile     67 y.o. male with history of CADs/p remote PCI, combined CHF, DM, HTN, HL, and severe COPD, who was admitted 9/22 with respiratory failure.  Assessment & Plan    1. Acute on chronic respiratory failure with hypoxia: -Multifactorial including COPD, ILD, and volume overload ---continue diuresis given continued clinic signs of acute on chronic systolic CHF  2. Acute on chronic combined CHF: -Echo and R/LHC as above Given SOB, leg edema, weight up,  Would continue diuresis, lasix 20 IV Q8 hrs Renal functio and labs stable  3. CAD/elevated troponin: -Prior inf infarct in 11/1999s/pPCI/BMS to RCA. Patent stent on 2004 cath in [place]. No ischemia on MV in 2013 [facility] as above with recommendation for medical management with intervention on the Ramus to be considered for refractory symptoms -Coreg, ASA, Lipitor Denies angina  4. HLD: -Lipitor, goal LDL <60  5. PSVT: -No further ectopy -Continue Coreg as above   Total encounter time more than 25 minutes  Greater than 50% was spent in counseling and coordination of care with the patient  For questions or  updates, please contact [place] Please consult [email or web address] for contact info under Cardiology/STEMI.   Signed, Esmond Plants, MD, Ph.D [facility]

## 2018-05-06 NOTE — Progress Notes (Signed)
Pt feeling short of breath, request breathing treatment. Duoneb given. I will continue to assess.

## 2018-05-07 LAB — BASIC METABOLIC PANEL
Anion gap: 10 (ref 5–15)
BUN: 26 mg/dL — ABNORMAL HIGH (ref 8–23)
CHLORIDE: 95 mmol/L — AB (ref 98–111)
CO2: 30 mmol/L (ref 22–32)
CREATININE: 0.69 mg/dL (ref 0.61–1.24)
Calcium: 9.3 mg/dL (ref 8.9–10.3)
GFR calc Af Amer: >60 mL/min (ref >60)
Glucose, Bld: 235 mg/dL — ABNORMAL HIGH (ref 70–99)
POTASSIUM: 3.6 mmol/L (ref 3.5–5.1)
SODIUM: 135 mmol/L (ref 135–145)

## 2018-05-07 LAB — GLUCOSE, CAPILLARY
GLUCOSE-CAPILLARY: 110 mg/dL — AB (ref 70–99)
Glucose-Capillary: 254 mg/dL — ABNORMAL HIGH (ref 70–99)
Glucose-Capillary: 164 mg/dL — ABNORMAL HIGH (ref 70–99)
Glucose-Capillary: 140 mg/dL — ABNORMAL HIGH (ref 70–99)

## 2018-05-07 MED ORDER — CARVEDILOL 12.5 MG PO TABS
12.5000 mg | ORAL_TABLET | Freq: Two times a day (BID) | ORAL | Status: DC
  Administered 2018-05-07 – 2018-05-08 (×2): 12.5 mg via ORAL
  Filled 2018-05-07 (×2): qty 1

## 2018-05-07 MED ORDER — ALBUTEROL SULFATE (2.5 MG/3ML) 0.083% IN NEBU
2.5000 mg | INHALATION_SOLUTION | RESPIRATORY_TRACT | Status: DC
  Administered 2018-05-07 – 2018-05-08 (×5): 2.5 mg via RESPIRATORY_TRACT
  Filled 2018-05-07 (×5): qty 3

## 2018-05-07 MED ORDER — POTASSIUM CHLORIDE CRYS ER 20 MEQ PO TBCR
40.0000 meq | EXTENDED_RELEASE_TABLET | Freq: Once | ORAL | Status: AC
  Administered 2018-05-07: 40 meq via ORAL
  Filled 2018-05-07: qty 2

## 2018-05-07 MED ORDER — SPIRONOLACTONE 25 MG PO TABS
25.0000 mg | ORAL_TABLET | Freq: Every day | ORAL | Status: DC
  Administered 2018-05-07 – 2018-05-08 (×2): 25 mg via ORAL
  Filled 2018-05-07 (×2): qty 1

## 2018-05-07 MED ORDER — FLUCONAZOLE 100 MG PO TABS
200.0000 mg | ORAL_TABLET | Freq: Once | ORAL | Status: AC
  Administered 2018-05-07: 200 mg via ORAL
  Filled 2018-05-07: qty 2

## 2018-05-07 MED ORDER — FLUCONAZOLE 100 MG PO TABS
100.0000 mg | ORAL_TABLET | Freq: Every day | ORAL | Status: DC
  Administered 2018-05-08: 100 mg via ORAL
  Filled 2018-05-07: qty 1

## 2018-05-07 NOTE — Progress Notes (Signed)
Progress Note  Patient Name: Tony Watson Date of Encounter: 05/07/2018  Primary Cardiologist: Kathlyn Sacramento, MD   Subjective   No chest pain.  Still a little short of breath but nearing baseline.  Both legs still mildly swollen.  Felt very fatigued and drowsy after awakening overnight and trying to remove compression stockings.  Lasted about an hour.  Patient notes right shoulder pain this AM; thinks he may have slept awkwardly.  Inpatient Medications    Scheduled Meds: . arformoterol  15 mcg Nebulization Q12H  . aspirin  81 mg Oral Daily  . atorvastatin  40 mg Oral q1800  . budesonide  0.5 mg Nebulization BID  . carvedilol  12.5 mg Oral BID WC  . cholecalciferol  2,000 Units Oral QPM  . docusate sodium  100 mg Oral BID  . enoxaparin (LOVENOX) injection  40 mg Subcutaneous Q24H  . feeding supplement (ENSURE ENLIVE)  237 mL Oral BID BM  . furosemide  20 mg Intravenous Q8H  . gabapentin  300 mg Oral QHS  . insulin aspart  0-5 Units Subcutaneous QHS  . insulin aspart  0-9 Units Subcutaneous TID WC  . losartan  12.5 mg Oral Daily  . megestrol  40 mg Oral Daily  . multivitamin with minerals  1 tablet Oral Daily  . nystatin  5 mL Mouth/Throat QID  . pantoprazole  40 mg Oral QAC breakfast  . [START ON 05/08/2018] predniSONE  20 mg Oral Once   Followed by  . [START ON 05/09/2018] predniSONE  10 mg Oral Once  . protein supplement  2-3 scoop Oral TID WC  . sodium chloride flush  3 mL Intravenous Q12H  . sodium chloride flush  3 mL Intravenous Q12H  . spironolactone  25 mg Oral Daily  . tiZANidine  4 mg Oral Nightly  . ascorbic acid  250 mg Oral BID   Continuous Infusions: . sodium chloride    . sodium chloride     PRN Meds: sodium chloride, sodium chloride, acetaminophen, acetaminophen, albuterol, ipratropium-albuterol, LORazepam, ondansetron (ZOFRAN) IV, oxymetazoline, pseudoephedrine, sodium chloride flush, sodium chloride flush   Vital Signs    Vitals:   05/06/18  2344 05/07/18 0422 05/07/18 0735 05/07/18 0800  BP: 110/61 115/86 113/86   Pulse: 86 94 (!) 102 (!) 101  Resp:  16 16 16   Temp: 98 F (36.7 C) 98.6 F (37 C) 98.2 F (36.8 C)   TempSrc: Oral Oral Oral   SpO2: 96% 99% 99% 98%  Weight:  64.8 kg    Height:        Intake/Output Summary (Last 24 hours) at 05/07/2018 0907 Last data filed at 05/07/2018 0706 Gross per 24 hour  Intake -  Output 2375 ml  Net -2375 ml   Filed Weights   05/05/18 0220 05/06/18 0700 05/07/18 0422  Weight: 66.3 kg 65.3 kg 64.8 kg    Telemetry    NSR and sinus tachycardia with PVC's and single 4-beat run of nonsustained ventricular tachycardia - Personally Reviewed  ECG    No new tracing  Physical Exam   GEN: No acute distress.   Neck: No JVD or HJR. Cardiac: RRR, no murmurs, rubs, or gallops.  Respiratory: Mildly diminished breath sounds bilaterally with scattered wheezes.  No crackles GI: Soft, nontender, non-distended  MS: 1+ distal calf edema; No deformity. Neuro:  Nonfocal  Psych: Normal affect   Labs    Chemistry Recent Labs  Lab 05/01/18 1024  05/05/18 8185 05/06/18 6314 05/07/18 9702  NA 134*   < > 137 133* 135  K 4.4   < > 3.7 3.9 3.6  CL 92*   < > 98 94* 95*  CO2 32   < > 30 30 30   GLUCOSE 136*   < > 273* 286* 235*  BUN 16   < > 27* 28* 26*  CREATININE 0.73   < > 0.70 0.68 0.69  CALCIUM 9.1   < > 8.6* 8.9 9.3  PROT 7.8  --   --   --   --   ALBUMIN 3.8  --   --   --   --   AST 56*  --   --   --   --   ALT 70*  --   --   --   --   ALKPHOS 78  --   --   --   --   BILITOT 1.0  --   --   --   --   GFRNONAA >60   < > >60 >60 >60  GFRAA >60   < > >60 >60 >60  ANIONGAP 10   < > 9 9 10    < > = values in this interval not displayed.     Hematology Recent Labs  Lab 05/01/18 1024 05/04/18 0253  WBC 12.9* 12.1*  RBC 4.06* 3.63*  HGB 13.6 12.1*  HCT 39.7* 35.3*  MCV 98.0 97.2  MCH 33.5 33.5  MCHC 34.2 34.4  RDW 17.0* 17.2*  PLT 238 176    Cardiac Enzymes Recent  Labs  Lab 05/01/18 1024  TROPONINI 0.04*   No results for input(s): TROPIPOC in the last 168 hours.   BNP Recent Labs  Lab 05/01/18 1024  BNP 1,279.0*     DDimer No results for input(s): DDIMER in the last 168 hours.   Radiology    No results found.  Cardiac Studies   L/RHC (05/03/18): 1. Three-vessel coronary artery disease with moderate to severe calcification, as outlined above, predominantly affecting the ramus intermedius and RCA (80 to 90% stenoses).  There is moderate LAD disease as well as 80% stenosis of small D2 branch. 2. Patent mid RCA stent with mild in-stent restenosis. 3. Mildly to moderately elevated left heart, right heart, and pulmonary artery pressures. 4. Mildly reduced Fick cardiac output/index.  Diagnostic Diagram      Echo (03/03/18): - Left ventricle: The cavity size was mildly dilated. Systolic   function was severely reduced. The estimated ejection fraction   was in the range of 25% to 30%. Severe diffuse hypokinesis.   Anterior wall motion best preserved. The study is not technically   sufficient to allow evaluation of LV diastolic function. - Mitral valve: There was mild regurgitation. - Left atrium: The atrium was moderately dilated. - Right ventricle: Systolic function was mildly reduced. - Pulmonary arteries: Systolic pressure could not be accurately   estimated. - Inferior vena cava: The vessel was dilated. The respirophasic   diameter changes were blunted (< 50%), consistent with elevated   central venous pressure.  Patient Profile     67 y.o. male with history of CADs/p remote PCI, combined CHF, DM, HTN, HL, and severe COPD, who was admitted 9/22 withrespiratoryfailure.  Assessment & Plan    Acute on chronic respiratory failure with hypoxia: Patient is still a little short of breath but nearing baseline.  Continue gentle diuresis with furosemide 20 mg IV every 8 hours for at least 1 more day.  Continue treatment of  parenchymal  lung disease per IM.  Acute on chronic systolic and diastolic heart failure Breathing slowly improving.  No JVD on exam but 1+ distal calf edema still present.  Patient negative 2.9L yesterday but weight only down 1 pound (no intake recorded).  Continue furosemide 20 mg IV every 8 hours for at least 1 more day.  If BP allows, I will increase carvedilol to 12.5 mg BID this evening.  Increase spironolactone to 25 mg daily.  If BP allows, consider increasing losartan prior to discharge.  Coronary artery disease No angina.  Troponin elevation on admission most likely reflects supply-demand mismatch.  Cath last week showed significant RCA and ramus disease.  Continue medical therapy.  Increase carvedilol to 12.5 mg BID  Continue ASA.  If patient has angina refractory to medical therapy, PCI to RCA +/- ramus could be consider.  RCA intervention would likely require atherectomy due to heavy calcification.  COPD and interstitial lung disease Likely playing a large role on chronic respiratory failure.  Wean steroids, as tolerated.  Further management per IM.  NSVT Asymptomatic.  Increase carvedilol to 12.5 mg BID.  Replete K for goal > 4.0.  Hyperlipidemia  Continue atorvastatin 40 mg daily.    For questions or updates, please contact [place] Please consult [email or web address] for contact info under [facility].     Signed, Nelva Bush, MD  05/07/2018, 9:07 AM

## 2018-05-07 NOTE — Plan of Care (Signed)
Pt non compliant with fluid restriction

## 2018-05-07 NOTE — Plan of Care (Signed)
  Problem: Clinical Measurements: Goal: Respiratory complications will improve Outcome: Progressing   

## 2018-05-07 NOTE — Progress Notes (Signed)
Patient ID: Tony Watson, male   DOB: 05-Feb-1951, 67 y.o.   MRN: [id number]  [employer] PROGRESS NOTE  Tony Watson [id number] DOB: November 27, 1950 DOA: 05/01/2018 PCP: Owens Loffler, MD  HPI/Subjective: Patient still not feeling great.  States he has thrush.  States he wants his nebulizers every 4 hours.  His legs are still swollen.  Still with some shortness of breath.  Objective: Vitals:   05/07/18 0800 05/07/18 1202  BP:    Pulse: (!) 101 (!) 107  Resp: 16 18  Temp:    SpO2: 98% 94%    Filed Weights   05/05/18 0220 05/06/18 0700 05/07/18 0422  Weight: 66.3 kg 65.3 kg 64.8 kg    ROS: Review of Systems  Constitutional: Negative for chills and fever.  Eyes: Negative for blurred vision.  Respiratory: Positive for cough and shortness of breath.   Cardiovascular: Negative for chest pain.  Gastrointestinal: Negative for abdominal pain, constipation, diarrhea, nausea and vomiting.  Genitourinary: Negative for dysuria.  Musculoskeletal: Negative for joint pain.  Neurological: Negative for dizziness and headaches.   Exam: Physical Exam  Constitutional: He is oriented to person, place, and time.  HENT:  Nose: No mucosal edema.  Mouth/Throat: No oropharyngeal exudate or posterior oropharyngeal edema.  Eyes: Pupils are equal, round, and reactive to light. Conjunctivae, EOM and lids are normal.  Neck: No JVD present. Carotid bruit is not present. No edema present. No thyroid mass and no thyromegaly present.  Cardiovascular: S1 normal and S2 normal. Exam reveals no gallop.  No murmur heard. Pulses:      Dorsalis pedis pulses are 2+ on the right side, and 2+ on the left side.  Respiratory: No respiratory distress. He has decreased breath sounds in the right lower field and the left lower field. He has wheezes in the right lower field and the left lower field. He has no rhonchi. He has no rales.  GI: Soft. Bowel sounds are normal. There is no tenderness.   Musculoskeletal:       Right ankle: He exhibits swelling.       Left ankle: He exhibits swelling.  Lymphadenopathy:    He has no cervical adenopathy.  Neurological: He is alert and oriented to person, place, and time. No cranial nerve deficit.  Skin: Skin is warm. No rash noted. Nails show no clubbing.  Psychiatric: He has a normal mood and affect.      Data Reviewed: Basic Metabolic Panel: Recent Labs  Lab 05/03/18 0253 05/04/18 0253 05/05/18 0256 05/06/18 0229 05/07/18 0418  NA 135 135 137 133* 135  K 3.5 3.3* 3.7 3.9 3.6  CL 93* 95* 98 94* 95*  CO2 33* 29 30 30 30   GLUCOSE 243* 303* 273* 286* 235*  BUN 23 24* 27* 28* 26*  CREATININE 0.70 0.71 0.70 0.68 0.69  CALCIUM 9.0 8.9 8.6* 8.9 9.3  MG  --   --  2.1  --   --    Liver Function Tests: Recent Labs  Lab 05/01/18 1024  AST 56*  ALT 70*  ALKPHOS 78  BILITOT 1.0  PROT 7.8  ALBUMIN 3.8   CBC: Recent Labs  Lab 05/01/18 1024 05/04/18 0253  WBC 12.9* 12.1*  NEUTROABS 7.6*  --   HGB 13.6 12.1*  HCT 39.7* 35.3*  MCV 98.0 97.2  PLT 238 176   Cardiac Enzymes: Recent Labs  Lab 05/01/18 1024  TROPONINI 0.04*   BNP (last 3 results) Recent Labs    03/03/18 0117 05/01/18  1024  BNP 1,011.0* 1,279.0*    CBG: Recent Labs  Lab 05/06/18 1131 05/06/18 1653 05/06/18 2105 05/07/18 0734 05/07/18 1150  GLUCAP 251* 299* 121* 110* 254*    Scheduled Meds: . albuterol  2.5 mg Nebulization Q4H  . arformoterol  15 mcg Nebulization Q12H  . aspirin  81 mg Oral Daily  . atorvastatin  40 mg Oral q1800  . budesonide  0.5 mg Nebulization BID  . carvedilol  12.5 mg Oral BID WC  . cholecalciferol  2,000 Units Oral QPM  . docusate sodium  100 mg Oral BID  . enoxaparin (LOVENOX) injection  40 mg Subcutaneous Q24H  . feeding supplement (ENSURE ENLIVE)  237 mL Oral BID BM  . [START ON 05/08/2018] fluconazole  100 mg Oral Daily  . furosemide  20 mg Intravenous Q8H  . gabapentin  300 mg Oral QHS  . insulin aspart   0-5 Units Subcutaneous QHS  . insulin aspart  0-9 Units Subcutaneous TID WC  . losartan  12.5 mg Oral Daily  . megestrol  40 mg Oral Daily  . multivitamin with minerals  1 tablet Oral Daily  . nystatin  5 mL Mouth/Throat QID  . pantoprazole  40 mg Oral QAC breakfast  . [START ON 05/08/2018] predniSONE  20 mg Oral Once   Followed by  . [START ON 05/09/2018] predniSONE  10 mg Oral Once  . protein supplement  2-3 scoop Oral TID WC  . sodium chloride flush  3 mL Intravenous Q12H  . sodium chloride flush  3 mL Intravenous Q12H  . spironolactone  25 mg Oral Daily  . tiZANidine  4 mg Oral Nightly  . ascorbic acid  250 mg Oral BID   Continuous Infusions: . sodium chloride    . sodium chloride      Assessment/Plan:  1. Acute on chronic respiratory failure with hypoxia.  Normally wears 4 L at home currently on 2 L. 2. Acute combined diastolic and systolic CHF.  Patient on IV Lasix, Coreg losartan and Aldactone 3. COPD exacerbation.  Finish prednisone taper.  Continue nebulizer treatments 4. Ischemic cardiomyopathy.  Last echocardiogram with an EF of 25 to 30%.  Patient has triple-vessel disease and cardiology recommends medical management 5. GERD on Protonix 6. Oral thrush on nystatin swish and swallow and add Diflucan 7. Hyperlipidemia unspecified on atorvastatin  Code Status:     Code Status Orders  (From admission, onward)         Start     Ordered   05/03/18 1108  Do not attempt resuscitation (DNR)  Continuous    Question Answer Comment  In the event of cardiac or respiratory ARREST Do not call a "code blue"   In the event of cardiac or respiratory ARREST Do not perform Intubation, CPR, defibrillation or ACLS   In the event of cardiac or respiratory ARREST Use medication by any route, position, wound care, and other measures to relive pain and suffering. May use oxygen, suction and manual treatment of airway obstruction as needed for comfort.      05/03/18 1107        Code  Status History    Date Active Date Inactive Code Status Order ID Comments User Context   05/03/2018 0955 05/03/2018 1107 Full Code [id number]  Nelva Bush, MD Inpatient   05/02/2018 1609 05/03/2018 0955 DNR [id number]  Jimmy Footman, NP Inpatient   05/02/2018 1422 05/02/2018 1609 Full Code [id number]  Jimmy Footman, NP Inpatient   05/02/2018 1303  05/02/2018 1422 DNR [id number]  Jimmy Footman, NP Inpatient   05/01/2018 1254 05/02/2018 1303 Full Code [id number]  Nicholes Mango, MD Inpatient   03/24/2018 1948 03/28/2018 1340 Full Code [id number]  Saundra Shelling, MD Inpatient   03/03/2018 0328 03/06/2018 1716 Full Code [id number]  Harrie Foreman, MD Inpatient    Advance Directive Documentation     Most Recent Value  Type of Advance Directive  Out of facility DNR (pink MOST or yellow form)  Pre-existing out of facility DNR order (yellow form or pink MOST form)  -  "MOST" Form in Place?  -      Disposition Plan: Once cleared by cardiology I will send the patient home  Consultants:  Cardiology  Time spent: 28 minutes  Nathalie

## 2018-05-07 NOTE — Plan of Care (Signed)
  Problem: Clinical Measurements: Goal: Ability to maintain clinical measurements within normal limits will improve Outcome: Progressing   Problem: Clinical Measurements: Goal: Respiratory complications will improve Outcome: Progressing   

## 2018-05-08 ENCOUNTER — Inpatient Hospital Stay: Payer: Medicare HMO

## 2018-05-08 LAB — BASIC METABOLIC PANEL
Anion gap: 12 (ref 5–15)
BUN: 28 mg/dL — ABNORMAL HIGH (ref 8–23)
CHLORIDE: 93 mmol/L — AB (ref 98–111)
CO2: 30 mmol/L (ref 22–32)
Calcium: 9.6 mg/dL (ref 8.9–10.3)
Creatinine, Ser: 0.58 mg/dL — ABNORMAL LOW (ref 0.61–1.24)
GFR calc non Af Amer: >60 mL/min (ref >60)
Glucose, Bld: 118 mg/dL — ABNORMAL HIGH (ref 70–99)
POTASSIUM: 4.3 mmol/L (ref 3.5–5.1)
SODIUM: 135 mmol/L (ref 135–145)

## 2018-05-08 LAB — CBC
HEMATOCRIT: 42.7 % (ref 40.0–52.0)
HEMOGLOBIN: 15.1 g/dL (ref 13.0–18.0)
MCH: 33.8 pg (ref 26.0–34.0)
MCHC: 35.4 g/dL (ref 32.0–36.0)
MCV: 95.4 fL (ref 80.0–100.0)
Platelets: 166 10*3/uL (ref 150–440)
RBC: 4.47 MIL/uL (ref 4.40–5.90)
RDW: 16.6 % — ABNORMAL HIGH (ref 11.5–14.5)
WBC: 22.6 10*3/uL — AB (ref 3.8–10.6)

## 2018-05-08 LAB — MAGNESIUM: MAGNESIUM: 2.1 mg/dL (ref 1.7–2.4)

## 2018-05-08 LAB — GLUCOSE, CAPILLARY
GLUCOSE-CAPILLARY: 110 mg/dL — AB (ref 70–99)
GLUCOSE-CAPILLARY: 268 mg/dL — AB (ref 70–99)

## 2018-05-08 MED ORDER — FUROSEMIDE 40 MG PO TABS
80.0000 mg | ORAL_TABLET | Freq: Two times a day (BID) | ORAL | Status: DC
  Administered 2018-05-08: 80 mg via ORAL
  Filled 2018-05-08: qty 2

## 2018-05-08 MED ORDER — ENSURE ENLIVE PO LIQD: 237.0000 mL | Freq: Two times a day (BID) | ORAL | 0 refills | Status: DC

## 2018-05-08 MED ORDER — FUROSEMIDE 80 MG PO TABS: 80.0000 mg | ORAL_TABLET | Freq: Two times a day (BID) | ORAL | 0 refills | Status: DC

## 2018-05-08 MED ORDER — AMOXICILLIN-POT CLAVULANATE 875-125 MG PO TABS: 1.0000 | ORAL_TABLET | Freq: Two times a day (BID) | ORAL | 9 refills | Status: DC

## 2018-05-08 MED ORDER — LOSARTAN POTASSIUM 25 MG PO TABS: 12.5000 mg | ORAL_TABLET | Freq: Every day | ORAL | 0 refills | Status: DC

## 2018-05-08 MED ORDER — ALBUTEROL SULFATE (2.5 MG/3ML) 0.083% IN NEBU: 2.5000 mg | INHALATION_SOLUTION | RESPIRATORY_TRACT | Status: DC | PRN

## 2018-05-08 MED ORDER — ATORVASTATIN CALCIUM 40 MG PO TABS: 40.0000 mg | ORAL_TABLET | Freq: Every day | ORAL | 0 refills | Status: DC

## 2018-05-08 MED ORDER — ASPIRIN 81 MG PO CHEW: 81.0000 mg | CHEWABLE_TABLET | Freq: Every day | ORAL | 0 refills | Status: DC

## 2018-05-08 MED ORDER — IPRATROPIUM-ALBUTEROL 0.5-2.5 (3) MG/3ML IN SOLN: 3.0000 mL | Freq: Every day | RESPIRATORY_TRACT | Status: DC

## 2018-05-08 MED ORDER — SPIRONOLACTONE 25 MG PO TABS: 25.0000 mg | ORAL_TABLET | Freq: Every day | ORAL | 0 refills | Status: DC

## 2018-05-08 MED ORDER — IPRATROPIUM-ALBUTEROL 0.5-2.5 (3) MG/3ML IN SOLN
3.0000 mL | Freq: Four times a day (QID) | RESPIRATORY_TRACT | Status: DC
  Administered 2018-05-08: 3 mL via RESPIRATORY_TRACT

## 2018-05-08 MED ORDER — LOSARTAN POTASSIUM 25 MG PO TABS: 25.0000 mg | ORAL_TABLET | Freq: Every day | ORAL | Status: DC

## 2018-05-08 MED ORDER — LOSARTAN POTASSIUM 25 MG PO TABS
12.5000 mg | ORAL_TABLET | Freq: Every day | ORAL | Status: DC
  Administered 2018-05-08: 12.5 mg via ORAL
  Filled 2018-05-08: qty 1

## 2018-05-08 MED ORDER — FLUCONAZOLE 100 MG PO TABS: 100.0000 mg | ORAL_TABLET | Freq: Every day | ORAL | 0 refills | Status: DC

## 2018-05-08 MED ORDER — AMOXICILLIN-POT CLAVULANATE 875-125 MG PO TABS
1.0000 | ORAL_TABLET | Freq: Two times a day (BID) | ORAL | Status: DC
  Administered 2018-05-08: 1 via ORAL
  Filled 2018-05-08: qty 1

## 2018-05-08 MED ORDER — LORAZEPAM 0.5 MG PO TABS: 0.5000 mg | ORAL_TABLET | Freq: Four times a day (QID) | ORAL | 0 refills | Status: DC | PRN

## 2018-05-08 NOTE — Discharge Summary (Signed)
[facility] NAME: Tony Watson    MR#:  [id number]  DATE OF BIRTH:  09/04/1950  DATE OF ADMISSION:  05/01/2018 ADMITTING PHYSICIAN: Nicholes Mango, MD  DATE OF DISCHARGE: 05/08/2018  2:13 PM  PRIMARY CARE PHYSICIAN: Owens Loffler, MD    ADMISSION DIAGNOSIS:  Acute on chronic respiratory failure with hypoxia (HCC) [J96.21]  DISCHARGE DIAGNOSIS:  Active Problems:   Respiratory failure with hypoxia (HCC)   COPD with acute exacerbation (HCC)   ILD (interstitial lung disease) ([place])   Acute on chronic systolic heart failure ([place])   SECONDARY DIAGNOSIS:   Past Medical History:  Diagnosis Date  . Arthritis   . CAD (coronary artery disease)    inferior MI 11/99 (in [place]) with PCI to [employer] last [place] 2004 [facility]): EF 50%, patent RCA stent. no obstructive disease. last myoview 2008: EF 42%, inferior infarct, no ischemia.   . 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 Chronic combined systolic (congestive) and diastolic (congestive) heart failure ([place])    a. 7/29019 Echo: EF 25-30%, sev diff HK. Mld MR. Mod dil LA.  Marland Kitchen COPD (chronic obstructive pulmonary disease) ([place])   . Diabetic autonomic neuropathy associated with type 2 diabetes mellitus ([place]) 03/27/2015  . Diverticulosis 2002  . DM2 (diabetes mellitus, type 2) (HCC)    A1c 6.0% 12/11  . GERD (gastroesophageal reflux disease)   . HTN (hypertension)   . Hyperlipidemia   . Ischemic cardiomyopathy    mild with EF 42% on myoviews 2008.  . Medical non-compliance   . Nicotine dependence    chronic, active  . Smoker   . Spinal stenosis   . 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 TIA (transient ischemic attack)    hx; now s/p PFO closure 2004 (in [place])  . Ulcer     HOSPITAL COURSE:   1.  Acute on chronic respiratory failure with hypoxia.  The patient normally wears 4 L of oxygen at home. 2.  Acute combined diastolic and systolic congestive heart failure.  The patient was diuresed  with IV Lasix during the entire hospital course.  Cardiology switch the patient over the Lasix 80 mg p.o. twice daily.  The patient was also on Coreg.  Was started on losartan and Aldactone.  Follow-up in the CHF clinic and advanced home health CHF protocol. 3.  COPD exacerbation, chronic interstitial lung disease.  Patient was on Solu-Medrol switched over to his usual prednisone.  Continue nebulizer treatments. 4.  Ischemic cardiomyopathy.  Cardiology recommends medical management.  Aspirin prescribed.  Patient already on Coreg.  Lipitor prescribed.  Cardiac catheterization done on this hospital course 5.  GERD on PPI 6.  Oral thrush give a course of oral Diflucan. 7.  Hyperlipidemia unspecified on atorvastatin 8.  Otitis media start Augmentin 9.  Swelling of lower extremities.  Sonogram negative for DVT  DISCHARGE CONDITIONS:   Fair  CONSULTS OBTAINED:  Treatment Team:  Nahser, Wonda Cheng, MD Rosine Door, MD  DRUG ALLERGIES:  No Known Allergies  DISCHARGE MEDICATIONS:   Allergies as of 05/08/2018   No Known Allergies     Medication List    TAKE these medications   acetaminophen 500 MG tablet Commonly known as:  TYLENOL Take 1,000 mg by mouth daily as needed for moderate pain or headache.   albuterol (2.5 MG/3ML) 0.083% nebulizer solution Commonly known as:  PROVENTIL Take 3 mLs (2.5 mg total) by nebulization every 4 (four)  hours as needed for wheezing or shortness of breath.   amoxicillin-clavulanate 875-125 MG tablet Commonly known as:  AUGMENTIN Take 1 tablet by mouth every 12 (twelve) hours.   ascorbic acid 250 MG tablet Commonly known as:  VITAMIN C Take 1 tablet (250 mg total) by mouth 2 (two) times daily.   aspirin 81 MG chewable tablet Chew 1 tablet (81 mg total) by mouth daily. Start taking on:  05/09/2018   atorvastatin 40 MG tablet Commonly known as:  LIPITOR Take 1 tablet (40 mg total) by mouth daily at 6 PM.   budesonide 0.5 MG/2ML nebulizer  solution Commonly known as:  PULMICORT Take 2 mLs (0.5 mg total) by nebulization 2 (two) times daily.   carvedilol 6.25 MG tablet Commonly known as:  COREG Take 12.5 mg by mouth 2 (two) times daily with a meal.   feeding supplement (ENSURE ENLIVE) Liqd Take 237 mLs by mouth 2 (two) times daily between meals.   fluconazole 100 MG tablet Commonly known as:  DIFLUCAN Take 1 tablet (100 mg total) by mouth daily. Start taking on:  05/09/2018   formoterol 20 MCG/2ML nebulizer solution Commonly known as:  PERFOROMIST Take 2 mLs (20 mcg total) by nebulization 2 (two) times daily.   furosemide 80 MG tablet Commonly known as:  LASIX Take 1 tablet (80 mg total) by mouth 2 (two) times daily. What changed:    medication strength  how much to take  when to take this   gabapentin 300 MG capsule Commonly known as:  NEURONTIN Take 1 capsule (300 mg total) by mouth at bedtime.   ipratropium-albuterol 0.5-2.5 (3) MG/3ML Soln Commonly known as:  DUONEB Take 3 mLs by nebulization every 4 (four) hours as needed.   LORazepam 0.5 MG tablet Commonly known as:  ATIVAN Take 1 tablet (0.5 mg total) by mouth every 6 (six) hours as needed for anxiety.   losartan 25 MG tablet Commonly known as:  COZAAR Take 0.5 tablets (12.5 mg total) by mouth daily. Start taking on:  05/09/2018   megestrol 40 MG tablet Commonly known as:  MEGACE Take 1 tablet (40 mg total) by mouth daily.   multivitamin with minerals Tabs tablet Take 1 tablet by mouth daily.   nystatin 100000 UNIT/ML suspension Commonly known as:  MYCOSTATIN Use as directed 5 mLs (500,000 Units total) in the mouth or throat 4 (four) times daily.   omeprazole 20 MG capsule Commonly known as:  PRILOSEC Take 20 mg by mouth daily.   oxymetazoline 0.05 % nasal spray Commonly known as:  AFRIN Place 1 spray into both nostrils daily as needed for congestion.   predniSONE 10 MG tablet Commonly known as:  DELTASONE Take 1 tablet (10 mg  total) by mouth daily with breakfast.   protein supplement Powd Take 12-18 g by mouth 3 (three) times daily with meals.   pseudoephedrine 30 MG tablet Commonly known as:  SUDAFED Take 30 mg by mouth daily as needed for congestion.   spironolactone 25 MG tablet Commonly known as:  ALDACTONE Take 1 tablet (25 mg total) by mouth daily. Start taking on:  05/09/2018   tiZANidine 4 MG tablet Commonly known as:  ZANAFLEX Take 1 tablet (4 mg total) by mouth Nightly.   Vitamin D 2000 units Caps Take 2,000 Units by mouth every evening.        DISCHARGE INSTRUCTIONS:   Follow-up PMD 5 days Follow-up CHF clinic Follow-up cardiology  If you experience worsening of your admission symptoms, develop shortness  of breath, life threatening emergency, suicidal or homicidal thoughts you must seek medical attention immediately by calling 911 or calling your MD immediately  if symptoms less severe.  You Must read complete instructions/literature along with all the possible adverse reactions/side effects for all the Medicines you take and that have been prescribed to you. Take any new Medicines after you have completely understood and accept all the possible adverse reactions/side effects.   Please note  You were cared for by a hospitalist during your hospital stay. If you have any questions about your discharge medications or the care you received while you were in the hospital after you are discharged, you can call the unit and asked to speak with the hospitalist on call if the hospitalist that took care of you is not available. Once you are discharged, your primary care physician will handle any further medical issues. Please note that NO REFILLS for any discharge medications will be authorized once you are discharged, as it is imperative that you return to your primary care physician (or establish a relationship with a primary care physician if you do not have one) for your aftercare needs so that  they can reassess your need for medications and monitor your lab values.    Today   CHIEF COMPLAINT:   Chief Complaint  Patient presents with  . Shortness of Breath    HISTORY OF PRESENT ILLNESS:  Tony Watson  is a 67 y.o. male presented with shortness of breath   VITAL SIGNS:  Blood pressure (!) 111/91, pulse 80, temperature 97.6 F (36.4 C), temperature source Oral, resp. rate 18, height 5\' 11"  (1.803 m), weight 63.6 kg, SpO2 97 %.   PHYSICAL EXAMINATION:  GENERAL:  67 y.o.-year-old patient lying in the bed with no acute distress.  EYES: Pupils equal, round, reactive to light and accommodation. No scleral icterus. Extraocular muscles intact.  HEENT: Head atraumatic, normocephalic. Oropharynx and nasopharynx clear.  NECK:  Supple, no jugular venous distention. No thyroid enlargement, no tenderness.  LUNGS: Decreased breath sounds bilateral base, slight expiratory wheeze.  No rales,rhonchi or crepitation. No use of accessory muscles of respiration.  CARDIOVASCULAR: S1, S2 normal. No murmurs, rubs, or gallops.  ABDOMEN: Soft, non-tender, non-distended. Bowel sounds present. No organomegaly or mass.  EXTREMITIES: No pedal edema, cyanosis, or clubbing.  NEUROLOGIC: Cranial nerves II through XII are intact. Muscle strength 5/5 in all extremities. Sensation intact. Gait not checked.  PSYCHIATRIC: The patient is alert and oriented x 3.  SKIN: No obvious rash, lesion, or ulcer.   DATA REVIEW:   CBC Recent Labs  Lab 05/08/18 0628  WBC 22.6*  HGB 15.1  HCT 42.7  PLT 166    Chemistries  Recent Labs  Lab 05/08/18 0628  NA 135  K 4.3  CL 93*  CO2 30  GLUCOSE 118*  BUN 28*  CREATININE 0.58*  CALCIUM 9.6  MG 2.1     RADIOLOGY:  Dg Chest 2 View  Result Date: 05/08/2018 CLINICAL DATA:  Shortness of breath. History of chronic myelogenous leukemia EXAM: CHEST - 2 VIEW COMPARISON:  May 01, 2018 FINDINGS: There is diffuse parenchymal lung fibrosis. There is no  appreciable edema or consolidation. Heart size and pulmonary vascular normal. Patient is status post median sternotomy. No evident adenopathy. There are foci of coronary artery calcification. No blastic or lytic bone lesions evident. IMPRESSION: Diffuse fibrotic change in the lungs without edema or consolidation. Heart size normal. No adenopathy. Foci of coronary artery calcification noted. Electronically Signed  By: Lowella Grip III M.D.   On: 05/08/2018 11:55   US Venous Img Lower Bilateral  Result Date: 05/08/2018 CLINICAL DATA:  Bilateral lower extremity edema for 1 week EXAM: BILATERAL LOWER EXTREMITY VENOUS DUPLEX ULTRASOUND TECHNIQUE: Gray-scale sonography with graded compression, as well as color Doppler and duplex ultrasound were performed to evaluate the lower extremity deep venous systems from the level of the common femoral vein and including the common femoral, femoral, profunda femoral, popliteal and calf veins including the posterior tibial, peroneal and gastrocnemius veins when visible. The superficial great saphenous vein was also interrogated. Spectral Doppler was utilized to evaluate flow at rest and with distal augmentation maneuvers in the common femoral, femoral and popliteal veins. COMPARISON:  None. FINDINGS: RIGHT LOWER EXTREMITY Common Femoral Vein: No evidence of thrombus. Normal compressibility, respiratory phasicity and response to augmentation. Saphenofemoral Junction: No evidence of thrombus. Normal compressibility and flow on color Doppler imaging. Profunda Femoral Vein: No evidence of thrombus. Normal compressibility and flow on color Doppler imaging. Femoral Vein: No evidence of thrombus. Normal compressibility, respiratory phasicity and response to augmentation. Popliteal Vein: No evidence of thrombus. Normal compressibility, respiratory phasicity and response to augmentation. Calf Veins: No evidence of thrombus. Normal compressibility and flow on color Doppler imaging.  Superficial Great Saphenous Vein: No evidence of thrombus. Normal compressibility. Venous Reflux:  None. Other Findings:  None. LEFT LOWER EXTREMITY Common Femoral Vein: No evidence of thrombus. Normal compressibility, respiratory phasicity and response to augmentation. Saphenofemoral Junction: No evidence of thrombus. Normal compressibility and flow on color Doppler imaging. Profunda Femoral Vein: No evidence of thrombus. Normal compressibility and flow on color Doppler imaging. Femoral Vein: No evidence of thrombus. Normal compressibility, respiratory phasicity and response to augmentation. Popliteal Vein: No evidence of thrombus. Normal compressibility, respiratory phasicity and response to augmentation. Calf Veins: No evidence of thrombus. Normal compressibility and flow on color Doppler imaging. Superficial Great Saphenous Vein: No evidence of thrombus. Normal compressibility. Venous Reflux:  None. Other Findings:  None. IMPRESSION: No evidence of deep venous thrombosis in either lower extremity. Electronically Signed   By: Lowella Grip III M.D.   On: 05/08/2018 11:53     Management plans discussed with the patient, and he is in agreement.  CODE STATUS:     Code Status Orders  (From admission, onward)         Start     Ordered   05/03/18 1108  Do not attempt resuscitation (DNR)  Continuous    Question Answer Comment  In the event of cardiac or respiratory ARREST Do not call a "code blue"   In the event of cardiac or respiratory ARREST Do not perform Intubation, CPR, defibrillation or ACLS   In the event of cardiac or respiratory ARREST Use medication by any route, position, wound care, and other measures to relive pain and suffering. May use oxygen, suction and manual treatment of airway obstruction as needed for comfort.      05/03/18 1107        Code Status History    Date Active Date Inactive Code Status Order ID Comments User Context   05/03/2018 0955 05/03/2018 1107 Full Code  [id number]  Nelva Bush, MD Inpatient   05/02/2018 1609 05/03/2018 0955 DNR [id number]  Jimmy Footman, NP Inpatient   05/02/2018 1422 05/02/2018 1609 Full Code [id number]  Jimmy Footman, NP Inpatient   05/02/2018 1303 05/02/2018 1422 DNR [id number]  Jimmy Footman, NP Inpatient   05/01/2018 1254 05/02/2018 1303 Full Code [id number]  Nicholes Mango, MD Inpatient   03/24/2018 1948 03/28/2018 1340 Full Code [id number]  Saundra Shelling, MD Inpatient   03/03/2018 0328 03/06/2018 1716 Full Code [id number]  Harrie Foreman, MD Inpatient    Advance Directive Documentation     Most Recent Value  Type of Advance Directive  Out of facility DNR (pink MOST or yellow form)  Pre-existing out of facility DNR order (yellow form or pink MOST form)  -  "MOST" Form in Place?  -      TOTAL TIME TAKING CARE OF THIS PATIENT: 38 minutes.    Loletha Grayer M.D on 05/08/2018 at 2:55 PM  Between 7am to 6pm - Pager - [phone]  After 6pm go to [email or web address] - password [email or web address]  [facility] Office  [phone]  CC: Primary care physician; Owens Loffler, MD

## 2018-05-08 NOTE — Progress Notes (Signed)
Progress Note  Patient Name: Tony Watson Date of Encounter: 05/08/2018  Primary Cardiologist: Kathlyn Sacramento, MD   Subjective   Minimal shortness of breath.  Slightly better leg swelling.  No chest pain.  Minimal lightheadedness yesterday - resolved.  Inpatient Medications    Scheduled Meds: . albuterol  2.5 mg Nebulization Q4H  . arformoterol  15 mcg Nebulization Q12H  . aspirin  81 mg Oral Daily  . atorvastatin  40 mg Oral q1800  . budesonide  0.5 mg Nebulization BID  . carvedilol  12.5 mg Oral BID WC  . cholecalciferol  2,000 Units Oral QPM  . docusate sodium  100 mg Oral BID  . enoxaparin (LOVENOX) injection  40 mg Subcutaneous Q24H  . feeding supplement (ENSURE ENLIVE)  237 mL Oral BID BM  . fluconazole  100 mg Oral Daily  . furosemide  80 mg Oral BID  . gabapentin  300 mg Oral QHS  . insulin aspart  0-5 Units Subcutaneous QHS  . insulin aspart  0-9 Units Subcutaneous TID WC  . losartan  12.5 mg Oral Daily  . megestrol  40 mg Oral Daily  . multivitamin with minerals  1 tablet Oral Daily  . nystatin  5 mL Mouth/Throat QID  . pantoprazole  40 mg Oral QAC breakfast  . [START ON 05/09/2018] predniSONE  10 mg Oral Once  . protein supplement  2-3 scoop Oral TID WC  . sodium chloride flush  3 mL Intravenous Q12H  . sodium chloride flush  3 mL Intravenous Q12H  . spironolactone  25 mg Oral Daily  . tiZANidine  4 mg Oral Nightly  . ascorbic acid  250 mg Oral BID   Continuous Infusions: . sodium chloride    . sodium chloride     PRN Meds: sodium chloride, sodium chloride, acetaminophen, acetaminophen, ipratropium-albuterol, LORazepam, ondansetron (ZOFRAN) IV, oxymetazoline, pseudoephedrine, sodium chloride flush, sodium chloride flush   Vital Signs    Vitals:   05/08/18 0406 05/08/18 0536 05/08/18 0730 05/08/18 0744  BP: 117/80   (!) 111/91  Pulse: 92  (!) 104 97  Resp: 17  18 18   Temp: 97.6 F (36.4 C)     TempSrc: Oral     SpO2: 100% 98% 98% 100%    Weight: 63.6 kg     Height:        Intake/Output Summary (Last 24 hours) at 05/08/2018 0913 Last data filed at 05/08/2018 0400 Gross per 24 hour  Intake 960 ml  Output 2750 ml  Net -1790 ml   Filed Weights   05/06/18 0700 05/07/18 0422 05/08/18 0406  Weight: 65.3 kg 64.8 kg 63.6 kg    Telemetry    Sinus tachycardia (100-120 bpm) - Personally Reviewed  ECG   No new tracing.  Physical Exam   GEN: No acute distress.   Neck: No JVD Cardiac: RRR, no murmurs, rubs, or gallops.  Respiratory: Mildly diminished throughout with scattered wheezes and rhonchi.  No crackles. GI: Soft, nontender, non-distended  MS: 1+ distal calf edema; No deformity. Neuro:  Nonfocal  Psych: Normal affect   Labs    Chemistry Recent Labs  Lab 05/01/18 1024  05/06/18 0229 05/07/18 0418 05/08/18 0628  NA 134*   < > 133* 135 135  K 4.4   < > 3.9 3.6 4.3  CL 92*   < > 94* 95* 93*  CO2 32   < > 30 30 30   GLUCOSE 136*   < > 286* 235* 118*  BUN 16   < > 28* 26* 28*  CREATININE 0.73   < > 0.68 0.69 0.58*  CALCIUM 9.1   < > 8.9 9.3 9.6  PROT 7.8  --   --   --   --   ALBUMIN 3.8  --   --   --   --   AST 56*  --   --   --   --   ALT 70*  --   --   --   --   ALKPHOS 78  --   --   --   --   BILITOT 1.0  --   --   --   --   GFRNONAA >60   < > >60 >60 >60  GFRAA >60   < > >60 >60 >60  ANIONGAP 10   < > 9 10 12    < > = values in this interval not displayed.     Hematology Recent Labs  Lab 05/01/18 1024 05/04/18 0253 05/08/18 0628  WBC 12.9* 12.1* 22.6*  RBC 4.06* 3.63* 4.47  HGB 13.6 12.1* 15.1  HCT 39.7* 35.3* 42.7  MCV 98.0 97.2 95.4  MCH 33.5 33.5 33.8  MCHC 34.2 34.4 35.4  RDW 17.0* 17.2* 16.6*  PLT 238 176 166    Cardiac Enzymes Recent Labs  Lab 05/01/18 1024  TROPONINI 0.04*   No results for input(s): TROPIPOC in the last 168 hours.   BNP Recent Labs  Lab 05/01/18 1024  BNP 1,279.0*     DDimer No results for input(s): DDIMER in the last 168 hours.   Radiology     No results found.  Cardiac Studies   L/RHC (05/03/18): 1. Three-vessel coronary artery disease with moderate to severe calcification, as outlined above, predominantly affecting the ramus intermedius and RCA (80 to 90% stenoses). There is moderate LAD disease as well as 80% stenosis of small D2 branch. 2. Patent mid RCA stent with mild in-stent restenosis. 3. Mildly to moderately elevated left heart, right heart, and pulmonary artery pressures. 4. Mildly reduced Fick cardiac output/index.  Diagnostic Diagram      Echo (03/03/18): - Left ventricle: The cavity size was mildly dilated. Systolic function was severely reduced. The estimated ejection fraction was in the range of 25% to 30%. Severe diffuse hypokinesis. Anterior wall motion best preserved. The study is not technically sufficient to allow evaluation of LV diastolic function. - Mitral valve: There was mild regurgitation. - Left atrium: The atrium was moderately dilated. - Right ventricle: Systolic function was mildly reduced. - Pulmonary arteries: Systolic pressure could not be accurately estimated. - Inferior vena cava: The vessel was dilated. The respirophasic diameter changes were blunted (<50%), consistent with elevated central venous pressure.   Patient Profile     67 y.o. male with history of CADs/p remote PCI, combined CHF, DM, HTN, HL, and severe COPD, who was admitted 9/22 withrespiratoryfailure.  Assessment & Plan    Acute on chronic respiratory failure with hypoxia Breathing continues to improve slightly.  On baseline supplemental oxygen.  Transition IV furosemide to PO furosemide 80 mg BID.  Continue treatment of ILD/COPD per internal medicine.  Acute on chronic systolic and diastolic heart failure No JVD but still 1+ distal calf edema.  It will likely take several more days for edema to resolve completely.  I think it is reasonable to transition to oral diuretics, as Tony Watson  otherwise appears euvolemic and breathing near baseline.  Switch to furosemide 80 mg PO BID.  Continue carvedilol 12.5 mg BID, losartan 12.5 mg daily, and spironolactone 25 mg daily.  Patient will need BMP in the office in the middle of next week.  Consider escalation of losartan as an outpatient, as blood pressure allows.  Coronary artery disease No angina.  Continue medical therapy.  PCI to RCA +/- ramus only if refractory symptoms.  Hyperlipidemia  Atorvastatin.  For questions or updates, please contact [place] Please consult [email or web address] for contact info under [facility].     Signed, Nelva Bush, MD  05/08/2018, 9:13 AM

## 2018-05-08 NOTE — Plan of Care (Signed)
  Problem: Health Behavior/Discharge Planning: Goal: Ability to manage health-related needs will improve Outcome: Progressing   Problem: Clinical Measurements: Goal: Ability to maintain clinical measurements within normal limits will improve Outcome: Progressing Goal: Will remain free from infection Outcome: Progressing Goal: Diagnostic test results will improve Outcome: Progressing Goal: Respiratory complications will improve Outcome: Progressing Goal: Cardiovascular complication will be avoided Outcome: Progressing   Problem: Activity: Goal: Risk for activity intolerance will decrease Outcome: Progressing   Problem: Nutrition: Goal: Adequate nutrition will be maintained Outcome: Progressing   Problem: Coping: Goal: Level of anxiety will decrease Outcome: Progressing   Problem: Elimination: Goal: Will not experience complications related to bowel motility Outcome: Progressing Goal: Will not experience complications related to urinary retention Outcome: Progressing   Problem: Pain Managment: Goal: General experience of comfort will improve Outcome: Progressing   Problem: Safety: Goal: Ability to remain free from injury will improve Outcome: Progressing   Problem: Skin Integrity: Goal: Risk for impaired skin integrity will decrease Outcome: Progressing   Problem: Spiritual Needs Goal: Ability to function at adequate level Outcome: Progressing   Problem: Education: Goal: Understanding of CV disease, CV risk reduction, and recovery process will improve Outcome: Progressing Goal: Individualized Educational Video(s) Outcome: Progressing   Problem: Activity: Goal: Ability to return to baseline activity level will improve Outcome: Progressing   Problem: Cardiovascular: Goal: Ability to achieve and maintain adequate cardiovascular perfusion will improve Outcome: Progressing   Problem: Health Behavior/Discharge Planning: Goal: Ability to safely manage  health-related needs after discharge will improve Outcome: Progressing   Problem: Cardiac: Goal: Ability to achieve and maintain adequate cardiopulmonary perfusion will improve Outcome: Progressing   Problem: Education: Goal: Ability to demonstrate management of disease process will improve Outcome: Progressing Goal: Ability to verbalize understanding of medication therapies will improve Outcome: Progressing Goal: Individualized Educational Video(s) Outcome: Progressing   Problem: Activity: Goal: Capacity to carry out activities will improve Outcome: Progressing

## 2018-05-08 NOTE — Progress Notes (Addendum)
Pt stated during his 0000 hr tx that he did not want to be awakened for his 0400 hr tx. Pt advised to call RT if he wakes up sob or wheezing.

## 2018-05-08 NOTE — Care Management Note (Signed)
Case Management Note  Patient Details  Name: Tony Watson MRN: [id number] Date of Birth: [birth year]  Subjective/Objective:  Patient to be discharged per MD order. Orders in place for home health services. Patient agreeable to home health and prefers [facility] since he has used them in the past. COPD/CHF protocol signed by MD and submitted to [facility]. [name] accepts referral. Patient on chronic O2 via [facility]. Portable tank at bedside. Family to transport.                    Action/Plan:   Expected Discharge Date:  05/08/18               Expected Discharge Plan:  [name]  In-House Referral:     Discharge planning Services  CM Consult  Post Acute Care Choice:    Choice offered to:  Spouse, Patient  DME Arranged:    DME Agency:     HH Arranged:  RN, Disease Management [place] Agency:  [place]  Status of Service:  Completed, signed off  If discussed at [place] of Stay Meetings, dates discussed:    Additional Comments:  Latanya Maudlin, RN 05/08/2018, 2:05 PM

## 2018-05-09 ENCOUNTER — Telehealth: Payer: Self-pay | Admitting: *Deleted

## 2018-05-09 NOTE — Telephone Encounter (Signed)
Patient contacted regarding discharge from [facility] on 05/08/18.   Patient understands to follow up with provider ? On 05/20/18 at 2pm at [facility] [place].  Patient understands discharge instructions? Yes  Patient understands medications and regiment? Yes  Patient understands to bring all medications to this visit? Yes

## 2018-05-09 NOTE — Telephone Encounter (Signed)
-----   Message from Blain Pais sent at 05/09/2018 12:08 PM EDT ----- Regarding: tcm/ph 10/11 2pm Christell Faith, PA

## 2018-05-09 NOTE — Telephone Encounter (Signed)
Spoke with [employer].  They are just needing verbal order for Palliative Care for [name]. Verbal order given.

## 2018-05-09 NOTE — Telephone Encounter (Signed)
Lm requesting return call to complete TCM and confirm hosp f/u appt  

## 2018-05-09 NOTE — Telephone Encounter (Signed)
[place] [facility] [place] is calling in wanting to know if Dr Lorelei Pont accepts the request for Pallative Care and in agreement if he will sign the order for the patient.  CB# [phone]

## 2018-05-09 NOTE — Telephone Encounter (Signed)
Yes. And I would like clarification, is this a request for hospice or palliative care? Are they asking me to be the attending for the patient?

## 2018-05-10 ENCOUNTER — Other Ambulatory Visit: Payer: Self-pay | Admitting: Internal Medicine

## 2018-05-10 ENCOUNTER — Telehealth: Payer: Self-pay | Admitting: Family Medicine

## 2018-05-10 NOTE — Telephone Encounter (Signed)
Verbal orders give to  [facility] from [facility] for [place] 2 times a week x 4 weeks, Nurse visit 2 times a week x 2 weeks, then 1 times a week x 3 weeks, then once every other week x 4 weeks

## 2018-05-10 NOTE — Telephone Encounter (Signed)
Last office visit 04/27/2018.  Last refilled 05/08/18 by Loletha Grayer, MD.  Rx was printed for #24 with no refills.  Next appt: 05/16/2018 for hospital follow up.

## 2018-05-10 NOTE — Telephone Encounter (Signed)
Cardiology has spoken to pt, who complete and bill TCM.

## 2018-05-10 NOTE — Telephone Encounter (Signed)
Copied from [place] [phone]. Topic: General - Other >> May 10, 2018  2:32 PM Yvette Rack wrote: Reason for CRM: Nurse Misty from [facility] [phone] calling for verbal orders for Plan of care for Home health Aid 2 times a week for 4 weeks Nurse visit 2 times a week for 2 weeks 1 times a week for 3 weeks Once every other week for 4 weeks

## 2018-05-10 NOTE — Telephone Encounter (Signed)
Copied from [place] [id number]. Topic: Quick Communication - Rx Refill/Question >> May 10, 2018  4:52 PM Cecelia Byars, NT wrote: Medication: LORazepam (ATIVAN) 0.5 MG tablet  Has the patient contacted their pharmacy? yes  (Agent: If no, request that the patient contact the pharmacy for the refill. (Agent: If yes, when and what did the pharmacy advise?  Preferred Pharmacy (with phone number or street name  [id number] - [place], [place] - [address] [phone] (Phone) [phone]  Agent: Please be advised that RX refills may take up to 3 business days. We ask that you follow-up with your pharmacy.

## 2018-05-11 ENCOUNTER — Telehealth: Payer: Self-pay | Admitting: Family Medicine

## 2018-05-11 MED ORDER — LORAZEPAM 0.5 MG PO TABS: 0.5000 mg | ORAL_TABLET | Freq: Three times a day (TID) | ORAL | 1 refills | Status: DC | PRN

## 2018-05-11 NOTE — Telephone Encounter (Signed)
Please sign refill to send electronically.

## 2018-05-11 NOTE — Telephone Encounter (Signed)
Copied from [place] [phone]. Topic: General - Other >> May 11, 2018  3:11 PM Yvette Rack wrote: Reason for CRM: Izora Gala OT from [facility] [phone] calling for verbal orders 2 times a week for 2 weeks 1 time a week for 1 week

## 2018-05-11 NOTE — Telephone Encounter (Signed)
ok 

## 2018-05-11 NOTE — Telephone Encounter (Signed)
Would you check on this? He should not be using 6 a day?

## 2018-05-11 NOTE — Telephone Encounter (Signed)
Left message for Raquel Sarna PT with [facility] giveng verbal orders for PT once a week x 1 week, then twice a week x  2 weeks, then  once a week x 1 week per Dr. Lorelei Pont.

## 2018-05-11 NOTE — Telephone Encounter (Signed)
agree

## 2018-05-11 NOTE — Telephone Encounter (Signed)
Left message for Izora Gala OT with [facility] giving verbal orders for OT 2 times a week x 2 weeks, then 1 time a week x 1 week per Dr. Lorelei Pont.

## 2018-05-11 NOTE — Telephone Encounter (Signed)
Spoke with Mr. Whiteley.  He states he did not receive the printed Rx from the hospital.

## 2018-05-11 NOTE — Telephone Encounter (Signed)
Lorazepam 0.5 mg, 1 po tid prn anxiety. #60, 1 refill

## 2018-05-11 NOTE — Telephone Encounter (Signed)
Copied from [place] [phone]. Topic: General - Other >> May 11, 2018  3:06 PM Yvette Rack wrote: Reason for CRM: PT Tony [facility] [phone] calling for verbal orders for PT Once a week for 1 week Twice a week for 2 weeks Once a week for 1 week

## 2018-05-11 NOTE — Telephone Encounter (Signed)
done

## 2018-05-12 NOTE — Progress Notes (Signed)
Patient ID: Tony Watson, male    DOB: [birth year], 67 y.o.   MRN: [id number]  HPI  Tony Watson is a 67 y/o male with a history of  Echo report from 03/03/18 reviewed and showed an EF of 25-30% along with mild Tony.   Right / left cardiac catheterization done 05/03/18 which showed:  1. Three-vessel coronary artery disease with moderate to severe calcification predominantly affecting the ramus intermedius and RCA (80 to 90% stenoses).  There is moderate LAD disease as well as 80% stenosis of small D2 branch. 2. Patent mid RCA stent with mild in-stent restenosis. 3. Mildly to moderately elevated left heart, right heart, and pulmonary artery pressures. 4:   Mildly reduced Fick cardiac output/index. Aggressive medical management recommended  Admitted 05/01/18 due to acute HF exacerbation. Cardiology and palliative care consults were obtained. Initially needed IV lasix and then transitioned to oral diuretics. Nebulizer and solu-medrol given for COPD/ ILD before transitioning to oral prednisone. Sonogram negative for DVT. Discharged after 7 days with home health. Was in the ED 04/24/18 due to COPD exacerbation where he was treated and released. Admitted 03/24/18 due to COPD exacerbation. Received IV steroids and nebulizers and continued on oxygen. IV diuretics given and cardiology consult obtained. Discharged after 4 days.   He presents today for his initial visit with a chief complaint of minimal shortness of breath upon moderate exertion. He describes this as having been present for several months. He has associated fatigue, wheezing, easy bruising and anxiety along with this. He denies any difficulty sleeping, abdominal distention, palpitations, pedal edema, chest pain, dizziness or weight gain. Wears oxygen at 2.5L around the clock.  Past Medical History:  Diagnosis Date  . Arthritis   . CAD (coronary artery disease)    inferior MI 11/99 (in [place]) with PCI to [facility]. last [place] 2004 [facility]): EF 50%,  patent RCA stent. no obstructive disease. last myoview 2008: EF 42%, inferior infarct, no ischemia.   . 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 Chronic combined systolic (congestive) and diastolic (congestive) heart failure ([place])    a. 7/29019 Echo: EF 25-30%, sev diff HK. Mld Tony. Mod dil LA.  Marland Kitchen COPD (chronic obstructive pulmonary disease) ([place])   . Diabetic autonomic neuropathy associated with type 2 diabetes mellitus ([place]) 03/27/2015  . Diverticulosis 2002  . DM2 (diabetes mellitus, type 2) ([facility])    A1c 6.0% 12/11  . GERD (gastroesophageal reflux disease)   . HTN (hypertension)   . Hyperlipidemia   . Ischemic cardiomyopathy    mild with EF 42% on myoviews 2008.  . Medical non-compliance   . Nicotine dependence    chronic, active  . Smoker   . Spinal stenosis   . 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 TIA (transient ischemic attack)    hx; now s/p PFO closure 2004 (in [place])  . Ulcer    Past Surgical History:  Procedure Laterality Date  . BACK SURGERY     patient denies-just lumbar punctures  . BRONCHOSCOPY    . CARDIAC CATHETERIZATION  11/99   CI per [facility]  . CATARACT EXTRACTION W/PHACO Right 11/24/2016   Procedure: CATARACT EXTRACTION PHACO AND INTRAOCULAR LENS PLACEMENT (IOC);  Surgeon: Birder Robson, MD;  Location: [facility] ORS;  Service: Ophthalmology;  Laterality: Right;  [place] 1:04.3 AP% 22.3 CDE 14.35 Fluid pack lot # [id number]  . CATARACT EXTRACTION W/PHACO Left 12/29/2016   Procedure: CATARACT EXTRACTION PHACO AND INTRAOCULAR LENS PLACEMENT (IOC);  Surgeon:  Birder Robson, MD;  Location: [facility] ORS;  Service: Ophthalmology;  Laterality: Left;  [place] 00:52 AP% 18.5 CDE 9.67 Fluid pack lot # [id number]  . COLONOSCOPY    . COLONOSCOPY WITH PROPOFOL N/A 05/25/2017   Procedure: COLONOSCOPY WITH PROPOFOL;  Surgeon: Jonathon Bellows, MD;  Location[facility] ENDOSCOPY;  Service: Gastroenterology;  Laterality: N/A;  . CORONARY ANGIOPLASTY     STENT  . CORONARY ARTERY BYPASS GRAFT     stent   . ESOPHAGOGASTRODUODENOSCOPY (EGD) WITH PROPOFOL N/A 05/25/2017   Procedure: ESOPHAGOGASTRODUODENOSCOPY (EGD) WITH PROPOFOL;  Surgeon: Jonathon Bellows, MD;  Location: [facility] ENDOSCOPY;  Service: Gastroenterology;  Laterality: N/A;  . EYE SURGERY    . FLEXIBLE BRONCHOSCOPY N/A 05/19/2017   Procedure: FLEXIBLE BRONCHOSCOPY;  Surgeon: Wilhelmina Mcardle, MD;  Location: [facility] ORS;  Service: Pulmonary;  Laterality: N/A;  . open heart surgery  2004   PFO repair  . RIGHT/LEFT HEART CATH AND CORONARY ANGIOGRAPHY N/A 05/03/2018   Procedure: RIGHT/LEFT HEART CATH AND CORONARY ANGIOGRAPHY;  Surgeon: Nelva Bush, MD;  Location: [place] CV LAB;  Service: Cardiovascular;  Laterality: N/A;  . UPPER GI ENDOSCOPY     Family History  Problem Relation Age of Onset  . Coronary artery disease Unknown        family hx  . Breast cancer Unknown        1st egree relative <50  . Cancer Mother   . Heart disease Father    Social History   Tobacco Use  . Smoking status: Former Smoker    Packs/day: 0.50    Years: 45.00    Pack years: 22.50    Types: Cigarettes    Last attempt to quit: 01/25/2018    Years since quitting: 0.2  . Smokeless tobacco: Never Used  . Tobacco comment: 1 ppd +40 years  Substance Use Topics  . Alcohol use: Not Currently    Alcohol/week: 0.0 standard drinks    Comment: weekly but last dose 1 month   No Known Allergies Prior to Admission medications   Medication Sig Start Date End Date Taking? Authorizing Provider  acetaminophen (TYLENOL) 500 MG tablet Take 1,000 mg by mouth daily as needed for moderate pain or headache.   Yes [provider]  albuterol (PROVENTIL) (2.5 MG/3ML) 0.083% nebulizer solution Take 3 mLs (2.5 mg total) by nebulization every 4 (four) hours as needed for wheezing or shortness of breath. 03/06/18  Yes Vaughan Basta, MD  amoxicillin-clavulanate (AUGMENTIN) 875-125 MG tablet Take 1 tablet by mouth every 12 (twelve) hours. 05/08/18  Yes Loletha Grayer, MD  aspirin 81 MG chewable tablet Chew 1 tablet (81 mg total) by mouth daily. 05/09/18  Yes Loletha Grayer, MD  atorvastatin (LIPITOR) 40 MG tablet Take 1 tablet (40 mg total) by mouth daily at 6 PM. 05/08/18  Yes Wieting, Richard, MD  budesonide (PULMICORT) 0.5 MG/2ML nebulizer solution Take 2 mLs (0.5 mg total) by nebulization 2 (two) times daily. 03/06/18 03/06/19 Yes Vaughan Basta, MD  carvedilol (COREG) 6.25 MG tablet Take 12.5 mg by mouth 2 (two) times daily with a meal.   Yes [provider]  Cholecalciferol (VITAMIN D) 2000 units CAPS Take 2,000 Units by mouth every evening.   Yes [provider]  feeding supplement, ENSURE ENLIVE, (ENSURE ENLIVE) LIQD Take 237 mLs by mouth 2 (two) times daily between meals. 05/08/18  Yes Wieting, Richard, MD  fluconazole (DIFLUCAN) 100 MG tablet Take 1 tablet (100 mg total) by mouth daily. 05/09/18  Yes Loletha Grayer, MD  formoterol (PERFOROMIST) 20 MCG/2ML nebulizer solution Take 2 mLs (20 mcg total) by nebulization 2 (two) times daily. 02/24/18  Yes [name], MD  furosemide (LASIX) 80 MG tablet Take 1 tablet (80 mg total) by mouth 2 (two) times daily. 05/08/18  Yes Wieting, Richard, MD  gabapentin (NEURONTIN) 300 MG capsule Take 1 capsule (300 mg total) by mouth at bedtime. 03/23/18  Yes Copland, Frederico Hamman, MD  ipratropium-albuterol (DUONEB) 0.5-2.5 (3) MG/3ML SOLN Take 3 mLs by nebulization every 4 (four) hours as needed. DX:J44.9 05/10/18  Yes Kasa, Maretta Bees, MD  LORazepam (ATIVAN) 0.5 MG tablet Take 1 tablet (0.5 mg total) by mouth 3 (three) times daily as needed for anxiety. 05/11/18  Yes Copland, Frederico Hamman, MD  losartan (COZAAR) 25 MG tablet Take 25 mg by mouth daily.   Yes [provider]  megestrol (MEGACE) 40 MG tablet Take 1 tablet (40 mg total) by mouth daily. 04/26/17  Yes Lloyd Huger, MD  Multiple Vitamin (MULTIVITAMIN WITH MINERALS) TABS tablet Take 1 tablet by mouth daily. 03/07/18  Yes Vaughan Basta, MD  nystatin (MYCOSTATIN) 100000 UNIT/ML suspension Use as directed 5 mLs (500,000 Units total) in the mouth or throat 4 (four) times daily. 03/23/18  Yes Copland, Frederico Hamman, MD  omeprazole (PRILOSEC) 20 MG capsule Take 20 mg by mouth daily.   Yes [provider]  oxymetazoline (AFRIN) 0.05 % nasal spray Place 1 spray into both nostrils daily as needed for congestion.   Yes [provider]  predniSONE (DELTASONE) 10 MG tablet Take 1 tablet (10 mg total) by mouth daily with breakfast. 04/29/18  Yes Copland, Frederico Hamman, MD  pseudoephedrine (SUDAFED) 30 MG tablet Take 30 mg by mouth daily as needed for congestion.    Yes [provider]  spironolactone (ALDACTONE) 25 MG tablet Take 1 tablet (25 mg total) by mouth daily. 05/09/18  Yes Wieting, Richard, MD  tiZANidine (ZANAFLEX) 4 MG tablet Take 1 tablet (4 mg total) by mouth Nightly. 03/23/18  Yes Copland, Frederico Hamman, MD  vitamin C (VITAMIN C) 250 MG tablet Take 1 tablet (250 mg total) by mouth 2 (two) times daily. 03/06/18  Yes Vaughan Basta, MD  protein supplement (RESOURCE BENEPROTEIN) POWD Take 12-18 g by mouth 3 (three) times daily with meals. Patient not taking: Reported on 05/13/2018 03/06/18   Vaughan Basta, MD    Review of Systems  Constitutional: Positive for fatigue. Negative for appetite change.  HENT: Negative for congestion, postnasal drip and sore throat.   Eyes: Negative.   Respiratory: Positive for shortness of breath (with moderate exertion) and wheezing. Negative for chest tightness.   Cardiovascular: Negative for chest pain, palpitations and leg swelling.  Gastrointestinal: Negative for abdominal distention and abdominal pain.  Endocrine: Negative.   Genitourinary: Negative.   Musculoskeletal: Negative for back pain and neck pain.  Skin: Negative.   Allergic/Immunologic: Negative.   Neurological: Negative for dizziness and light-headedness.  Hematological: Negative for adenopathy.  Bruises/bleeds easily.  Psychiatric/Behavioral: Negative for dysphoric mood and sleep disturbance. The patient is nervous/anxious.     Vitals:   05/13/18 1215  BP: 125/72  Pulse: (!) 105  Resp: 18  SpO2: 97%  Weight: 142 lb 4 oz (64.5 kg)  Height: _0  (1.803 m)   Wt Readings from Last 3 Encounters:  05/13/18 142 lb 4 oz (64.5 kg)  05/08/18 140 lb 3.2 oz (63.6 kg)  04/27/18 150 lb 12 oz (68.4 kg)   Lab Results  Component Value Date   CREATININE 0.58 (L) 05/08/2018  CREATININE 0.69 05/07/2018   CREATININE 0.68 05/06/2018    Physical Exam  Constitutional: He is oriented to person, place, and time. He appears well-developed and well-nourished.  HENT:  Head: Normocephalic and atraumatic.  Neck: Normal range of motion. Neck supple. No JVD present.  Cardiovascular: Regular rhythm. Tachycardia present.  Pulmonary/Chest: Effort normal. He has wheezes (bilateral upper lobes). He has no rales.  Abdominal: Soft. He exhibits no distension.  Musculoskeletal: He exhibits no edema or tenderness.  Neurological: He is alert and oriented to person, place, and time.  Skin: Skin is warm and dry.  Psychiatric: He has a normal mood and affect. His behavior is normal. Thought content normal.  Nursing note and vitals reviewed.   Assessment & Plan:  1: Chronic heart failure with reduced ejection fraction- - NYHA class II - euvolemic today - weighing daily and he was reminded to call for an overnight weight gain of >2 pounds or a weekly weight gain of >5 pounds - not adding salt to his food and his wife rinses canned foods and tries to read food labels. Reviewed the importance of closely following a 2053m sodium diet and written dietary information was given to him about this - saw cardiology (Rockey Situ 04/04/18 - if BP remains good, consider changing his losartan to entresto - may need to titrate up carvedilol in the future if patient remains tachycardia - has received his flu vaccine for  this season - BNP 05/01/18 was 1279.0  2: HTN- - BP looks good today - saw PCP (Copland) 04/06/18; have messaged him about checking labs at his next visit on 05/16/18 since he was discharged on losartan and spironolactone - BMP 05/08/18 reviewed and showed sodium 135, potassium 4.3, creatinine 0.58 and GFR >60  3: ILD/ COPD- - saw pulmonologist ([place] 03/28/18  Medication list was reviewed.   Return in 1 month or sooner for any questions/problems before then.

## 2018-05-13 ENCOUNTER — Encounter: Payer: Self-pay | Admitting: Family

## 2018-05-13 ENCOUNTER — Ambulatory Visit: Payer: Medicare HMO | Attending: Family | Admitting: Family

## 2018-05-13 VITALS — BP 125/72 | HR 105 | Resp 18 | Ht 71.0 in | Wt 142.2 lb

## 2018-05-13 DIAGNOSIS — I10 Essential (primary) hypertension: Secondary | ICD-10-CM

## 2018-05-13 DIAGNOSIS — Z79899 Other long term (current) drug therapy: Secondary | ICD-10-CM | POA: Insufficient documentation

## 2018-05-13 DIAGNOSIS — E785 Hyperlipidemia, unspecified: Secondary | ICD-10-CM | POA: Insufficient documentation

## 2018-05-13 DIAGNOSIS — Z951 Presence of aortocoronary bypass graft: Secondary | ICD-10-CM | POA: Insufficient documentation

## 2018-05-13 DIAGNOSIS — Z7982 Long term (current) use of aspirin: Secondary | ICD-10-CM | POA: Insufficient documentation

## 2018-05-13 DIAGNOSIS — J449 Chronic obstructive pulmonary disease, unspecified: Secondary | ICD-10-CM | POA: Insufficient documentation

## 2018-05-13 DIAGNOSIS — I252 Old myocardial infarction: Secondary | ICD-10-CM | POA: Diagnosis not present

## 2018-05-13 DIAGNOSIS — Z955 Presence of coronary angioplasty implant and graft: Secondary | ICD-10-CM | POA: Insufficient documentation

## 2018-05-13 DIAGNOSIS — F419 Anxiety disorder, unspecified: Secondary | ICD-10-CM | POA: Insufficient documentation

## 2018-05-13 DIAGNOSIS — Z8673 Personal history of transient ischemic attack (TIA), and cerebral infarction without residual deficits: Secondary | ICD-10-CM | POA: Insufficient documentation

## 2018-05-13 DIAGNOSIS — E119 Type 2 diabetes mellitus without complications: Secondary | ICD-10-CM | POA: Diagnosis not present

## 2018-05-13 DIAGNOSIS — I5042 Chronic combined systolic (congestive) and diastolic (congestive) heart failure: Secondary | ICD-10-CM | POA: Insufficient documentation

## 2018-05-13 DIAGNOSIS — I110 Hypertensive heart disease with heart failure: Secondary | ICD-10-CM | POA: Insufficient documentation

## 2018-05-13 DIAGNOSIS — Z87891 Personal history of nicotine dependence: Secondary | ICD-10-CM | POA: Insufficient documentation

## 2018-05-13 DIAGNOSIS — I5022 Chronic systolic (congestive) heart failure: Secondary | ICD-10-CM | POA: Insufficient documentation

## 2018-05-13 DIAGNOSIS — J849 Interstitial pulmonary disease, unspecified: Secondary | ICD-10-CM | POA: Insufficient documentation

## 2018-05-13 DIAGNOSIS — I2510 Atherosclerotic heart disease of native coronary artery without angina pectoris: Secondary | ICD-10-CM | POA: Diagnosis not present

## 2018-05-13 DIAGNOSIS — I255 Ischemic cardiomyopathy: Secondary | ICD-10-CM | POA: Insufficient documentation

## 2018-05-13 DIAGNOSIS — K219 Gastro-esophageal reflux disease without esophagitis: Secondary | ICD-10-CM | POA: Diagnosis not present

## 2018-05-13 NOTE — Patient Instructions (Addendum)
Continue weighing daily and call for an overnight weight gain of > 2 pounds or a weekly weight gain of >5 pounds.  Get BMP checked next week at one of your doctor's appointments

## 2018-05-16 ENCOUNTER — Encounter: Payer: Self-pay | Admitting: Family Medicine

## 2018-05-16 ENCOUNTER — Ambulatory Visit (INDEPENDENT_AMBULATORY_CARE_PROVIDER_SITE_OTHER): Payer: Medicare HMO | Admitting: Family Medicine

## 2018-05-16 ENCOUNTER — Telehealth: Payer: Self-pay | Admitting: Family Medicine

## 2018-05-16 VITALS — BP 110/60 | HR 115 | Temp 98.0°F | Ht 71.0 in | Wt 144.0 lb

## 2018-05-16 DIAGNOSIS — Z79899 Other long term (current) drug therapy: Secondary | ICD-10-CM

## 2018-05-16 DIAGNOSIS — C9310 Chronic myelomonocytic leukemia not having achieved remission: Secondary | ICD-10-CM

## 2018-05-16 DIAGNOSIS — E43 Unspecified severe protein-calorie malnutrition: Secondary | ICD-10-CM | POA: Diagnosis not present

## 2018-05-16 DIAGNOSIS — J9621 Acute and chronic respiratory failure with hypoxia: Secondary | ICD-10-CM

## 2018-05-16 DIAGNOSIS — I5022 Chronic systolic (congestive) heart failure: Secondary | ICD-10-CM

## 2018-05-16 LAB — HEPATIC FUNCTION PANEL
ALK PHOS: 80 U/L (ref 39–117)
ALT: 79 U/L — AB (ref 0–53)
AST: 40 U/L — ABNORMAL HIGH (ref 0–37)
Albumin: 4.1 g/dL (ref 3.5–5.2)
BILIRUBIN DIRECT: 0.1 mg/dL (ref 0.0–0.3)
TOTAL PROTEIN: 7.7 g/dL (ref 6.0–8.3)
Total Bilirubin: 0.4 mg/dL (ref 0.2–1.2)

## 2018-05-16 LAB — BASIC METABOLIC PANEL
BUN: 13 mg/dL (ref 6–23)
CALCIUM: 9.3 mg/dL (ref 8.4–10.5)
CO2: 28 mEq/L (ref 19–32)
CREATININE: 0.64 mg/dL (ref 0.40–1.50)
Chloride: 96 mEq/L (ref 96–112)
GFR: 132.83 mL/min (ref >60.00)
Glucose, Bld: 202 mg/dL — ABNORMAL HIGH (ref 70–99)
Potassium: 5.1 mEq/L (ref 3.5–5.1)
Sodium: 130 mEq/L — ABNORMAL LOW (ref 135–145)

## 2018-05-16 LAB — CBC WITH DIFFERENTIAL/PLATELET
BASOS ABS: 0 10*3/uL (ref 0.0–0.1)
Basophils Relative: 0.4 % (ref 0.0–3.0)
EOS ABS: 0 10*3/uL (ref 0.0–0.7)
Eosinophils Relative: 0 % (ref 0.0–5.0)
HEMATOCRIT: 39.3 % (ref 39.0–52.0)
HEMOGLOBIN: 13.5 g/dL (ref 13.0–17.0)
LYMPHS PCT: 8.7 % — AB (ref 12.0–46.0)
Lymphs Abs: 0.9 10*3/uL (ref 0.7–4.0)
MCHC: 34.3 g/dL (ref 30.0–36.0)
MCV: 94.8 fl (ref 78.0–100.0)
Monocytes Absolute: 2.8 10*3/uL — ABNORMAL HIGH (ref 0.1–1.0)
Monocytes Relative: 28.4 % — ABNORMAL HIGH (ref 3.0–12.0)
Neutro Abs: 6.2 10*3/uL (ref 1.4–7.7)
Neutrophils Relative %: 62.5 % (ref 43.0–77.0)
Platelets: 99 10*3/uL — ABNORMAL LOW (ref 150.0–400.0)
RBC: 4.15 Mil/uL — AB (ref 4.22–5.81)
RDW: 16.4 % — ABNORMAL HIGH (ref 11.5–15.5)

## 2018-05-16 NOTE — Telephone Encounter (Signed)
Copied from [place] [phone]. Topic: Quick Communication - See Telephone Encounter >> May 16, 2018  2:56 PM Percell Belt A wrote: CRM for notification. See Telephone encounter for: 05/16/18.  Will Bonnet, [facility] [phone] Pt was sch to have a home health aid to come out but pt has refused that service

## 2018-05-16 NOTE — Progress Notes (Signed)
Dr. Frederico Hamman T. Montford Barg, MD, [facility] [place], [address] Phone: [phone] Fax: [phone]  05/16/2018  Patient: Tony Watson, MRN: [id number], DOB: 08/06/1951, 67 y.o.  Primary Physician:  Owens Loffler, MD   Chief Complaint  Patient presents with  . Hospitalization Follow-up   Subjective:   Tony Watson is a 67 y.o. very pleasant male patient who presents with the following:  Transitions of Care:  DATE OF ADMISSION:  05/01/2018   DATE OF DISCHARGE: 05/08/2018   Acute on chronic resp failure, ILD, chronically 4L 02 at all times. Acute on chronic systolic CHF, EF 19%  Medical management of CAD and acute on chronic CHF: Lasix 80 mg bid Coreg, losartan, and aldactone, lipitor.  ARF and COPD exacerbation. Solumedrol and then oral prednisone. Cont nebs,.   Also on Augmentin for OM  He is feeling ok now, but he is in a wheelchair in the office.  Repeat bmp, cbc, hfp  Past Medical History, Surgical History, Social History, Family History, Problem List, Medications, and Allergies have been reviewed and updated if relevant.  Patient Active Problem List   Diagnosis Date Noted  . DNR / DNI, Advanced Directives Counselling 04/27/2018 04/28/2018    Priority: High    Class: Acute  . Respiratory failure with hypoxia ([place]) 03/03/2018    Priority: High  . Protein-calorie malnutrition, severe 03/03/2018    Priority: High  . 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 Severe chronic obstructive pulmonary disease ([place]) 03/23/2011    Priority: High  . Type 2 diabetes mellitus with vascular disease ([place]) 01/02/2010    Priority: High  . Chronic systolic heart failure ([name]) 05/13/2018  . HTN (hypertension) 05/13/2018  . Acute on chronic systolic heart failure ([place])   . ILD (interstitial lung disease) ([place])   . Ischemic cardiomyopathy 03/24/2018  .  COPD with acute exacerbation ([place]) 03/24/2018  . Diabetic autonomic neuropathy associated with type 2 diabetes mellitus ([place]) 03/27/2015  . History of inferior MI (myocardial infarction) 10/14/2013  . CAD (coronary artery disease)   . History of noncompliance with medical treatment 05/30/2012  . Other emphysema ([place]) 03/23/2011  . ALLERGIC RHINITIS CAUSE UNSPECIFIED 07/31/2010  . TOBACCO USE 04/17/2009  . TRANSIENT ISCHEMIC ATTACKS, HX OF 04/17/2009  . Hyperlipidemia 12/19/2008    Past Medical History:  Diagnosis Date  . Arthritis   . CAD (coronary artery disease)    inferior MI 11/99 (in [place]) with PCI to [facility]. last [place] [facility]): EF 50%, patent RCA stent. no obstructive disease. last myoview 2008: EF 42%, inferior infarct, no ischemia.   . 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 Chronic combined systolic (congestive) and diastolic (congestive) heart failure ([place])    a. 7/29019 Echo: EF 25-30%, sev diff HK. Mld MR. Mod dil LA.  Marland Kitchen COPD (chronic obstructive pulmonary disease) ([place])   . Diabetic autonomic neuropathy associated with type 2 diabetes mellitus ([place]) 03/27/2015  . Diverticulosis 2002  . DM2 (diabetes mellitus, type 2) ([facility])    A1c 6.0% 12/11  . GERD (gastroesophageal reflux disease)   . HTN (hypertension)   . Hyperlipidemia   . Ischemic cardiomyopathy    mild with EF 42% on myoviews 2008.  . Medical non-compliance   . Nicotine dependence    chronic, active  . Smoker   . Spinal stenosis   . Stroke [facility])    RECURRENT  AFFECTING MEMORY  . TIA (transient ischemic attack)    hx; now s/p PFO closure 2004 (in [place])  . Ulcer     Past Surgical History:  Procedure Laterality Date  . BACK SURGERY     patient denies-just lumbar punctures  . BRONCHOSCOPY    . CARDIAC CATHETERIZATION  11/99   CI per PMH  . CATARACT EXTRACTION W/PHACO Right 11/24/2016   Procedure: CATARACT EXTRACTION PHACO AND INTRAOCULAR LENS PLACEMENT (IOC);  Surgeon: Birder Robson, MD;  Location: [facility] ORS;   Service: Ophthalmology;  Laterality: Right;  [place] 1:04.3 AP% 22.3 CDE 14.35 Fluid pack lot # [id number]  . CATARACT EXTRACTION W/PHACO Left 12/29/2016   Procedure: CATARACT EXTRACTION PHACO AND INTRAOCULAR LENS PLACEMENT (IOC);  Surgeon: Birder Robson, MD;  Location: [facility] ORS;  Service: Ophthalmology;  Laterality: Left;  [place] 00:52 AP% 18.5 CDE 9.67 Fluid pack lot # [id number]  . COLONOSCOPY    . COLONOSCOPY WITH PROPOFOL N/A 05/25/2017   Procedure: COLONOSCOPY WITH PROPOFOL;  Surgeon: Jonathon Bellows, MD;  Location: [facility] ENDOSCOPY;  Service: Gastroenterology;  Laterality: N/A;  . CORONARY ANGIOPLASTY     STENT  . CORONARY ARTERY BYPASS GRAFT     stent  . ESOPHAGOGASTRODUODENOSCOPY (EGD) WITH PROPOFOL N/A 05/25/2017   Procedure: ESOPHAGOGASTRODUODENOSCOPY (EGD) WITH PROPOFOL;  Surgeon: Jonathon Bellows, MD;  Location: [facility] ENDOSCOPY;  Service: Gastroenterology;  Laterality: N/A;  . EYE SURGERY    . FLEXIBLE BRONCHOSCOPY N/A 05/19/2017   Procedure: FLEXIBLE BRONCHOSCOPY;  Surgeon: Wilhelmina Mcardle, MD;  Location: [facility] ORS;  Service: Pulmonary;  Laterality: N/A;  . open heart surgery  2004   PFO repair  . RIGHT/LEFT HEART CATH AND CORONARY ANGIOGRAPHY N/A 05/03/2018   Procedure: RIGHT/LEFT HEART CATH AND CORONARY ANGIOGRAPHY;  Surgeon: Nelva Bush, MD;  Location: [facility] CV LAB;  Service: Cardiovascular;  Laterality: N/A;  . UPPER GI ENDOSCOPY      Social History   Socioeconomic History  . Marital status: Married    Spouse name: Not on file  . Number of children: Not on file  . Years of education: Not on file  . Highest education level: Not on file  Occupational History  . Not on file  Social Needs  . Financial resource strain: Not on file  . Food insecurity:    Worry: Not on file    Inability: Not on file  . Transportation needs:    Medical: Not on file    Non-medical: Not on file  Tobacco Use  . Smoking status: Former Smoker    Packs/day: 0.50    Years: 45.00    Pack  years: 22.50    Types: Cigarettes    Last attempt to quit: 01/25/2018    Years since quitting: 0.3  . Smokeless tobacco: Never Used  . Tobacco comment: 1 ppd +40 years  Substance and Sexual Activity  . Alcohol use: Not Currently    Alcohol/week: 0.0 standard drinks    Comment: weekly but last dose 1 month  . Drug use: No  . Sexual activity: Not on file  Lifestyle  . Physical activity:    Days per week: Not on file    Minutes per session: Not on file  . Stress: Not on file  Relationships  . Social connections:    Talks on phone: Not on file    Gets together: Not on file    Attends religious service: Not on file    Active member of club or organization: Not on file  Attends meetings of clubs or organizations: Not on file    Relationship status: Not on file  . Intimate partner violence:    Fear of current or ex partner: Not on file    Emotionally abused: Not on file    Physically abused: Not on file    Forced sexual activity: Not on file  Other Topics Concern  . Not on file  Social History Narrative   Married, 1 daughter.  Lives in [place], [place]    Family History  Problem Relation Age of Onset  . Coronary artery disease Unknown        family hx  . Breast cancer Unknown        1st egree relative <50  . Cancer Mother   . Heart disease Father     No Known Allergies  Medication list reviewed and updated in full in [place].   GEN: No acute illnesses, no fevers, chills. GI: No n/v/d, eating normally Pulm: No SOB Interactive and getting along well at home.  Otherwise, ROS is as per the HPI.  Objective:   BP 110/60   Pulse (!) 115   Temp 98 F (36.7 C) (Oral)   Ht _0  (1.803 m)   Wt 144 lb (65.3 kg)   SpO2 92% Comment: 2 L 02  BMI 20.08 kg/m   GEN: WDWN, NAD, Non-toxic, A & O x 3 HEENT: Atraumatic, Normocephalic. Neck supple. No masses, No LAD. Ears and Nose: No external deformity. CV: RRR, No M/G/R. No JVD. No thrill. No extra heart  sounds. PULM: b wheezes with mild rhonchi EXTR: No c/c/e NEURO in wheelchair PSYCH: Normally interactive. Conversant. Not depressed or anxious appearing.  Calm demeanor.   Laboratory and Imaging Data:  Right and Left heart cath:  Conclusions: 1. Three-vessel coronary artery disease with moderate to severe calcification, as outlined above, predominantly affecting the ramus intermedius and RCA (80 to 90% stenoses).  There is moderate LAD disease as well as 80% stenosis of small D2 branch. 2. Patent mid RCA stent with mild in-stent restenosis. 3. Mildly to moderately elevated left heart, right heart, and pulmonary artery pressures. 4. Mildly reduced Fick cardiac output/index.  Recommendations: 1. Aggressive medical therapy.  I recommend at least one more day of IV diuresis. 2. Initiate losartan 12.5 mg daily.  I will decrease carvedilol to 6.25 mg twice daily to minimize the risk for hypotension.  If blood pressure tolerates, the patient could be switched to [facility] in the future. 3. Medical therapy of significant two-vessel CAD.  If patient remains significantly symptomatic despite optimization of his heart failure and underlying lung disease, PCI to the RCA +/-ramus intermedius could be considered.  RCA stenosis may require atherectomy given significant calcification.  Recommend Aspirin 76m daily for moderate to severe multivessel CAD.   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  He tells me he is taking all of the new meds without problems.   We  will check labs, particularly BMP with lasix use.  Follow-up: Return in about 2 months (around 07/16/2018).  No orders of the defined types were placed in this encounter.  Orders Placed This Encounter  Procedures  . Basic metabolic panel  . CBC with Differential/Platelet  . Hepatic function panel    Signed,  Frederico Hamman T. Lakyla Biswas, MD   Allergies as of 05/16/2018   No Known Allergies     Medication List        Accurate as of 05/16/18  1:36 PM. Always use your most recent med list.          acetaminophen 500 MG tablet Commonly known as:  TYLENOL Take 1,000 mg by mouth daily as needed for moderate pain or headache.   albuterol (2.5 MG/3ML) 0.083% nebulizer solution Commonly known as:  PROVENTIL Take 3 mLs (2.5 mg total) by nebulization every 4 (four) hours as needed for wheezing or shortness of breath.   amoxicillin-clavulanate 875-125 MG tablet Commonly known as:  AUGMENTIN Take 1 tablet by mouth every 12 (twelve) hours.   ascorbic acid 250 MG tablet Commonly known as:  VITAMIN C Take 1 tablet (250 mg total) by mouth 2 (two) times daily.   aspirin 81 MG chewable tablet Chew 1 tablet (81 mg total) by mouth daily.   atorvastatin 40 MG tablet Commonly known as:  LIPITOR Take 1 tablet (40 mg total) by mouth daily at 6 PM.   budesonide 0.5 MG/2ML nebulizer solution Commonly known as:  PULMICORT Take 2 mLs (0.5 mg total) by nebulization 2 (two) times daily.   carvedilol 6.25 MG tablet Commonly known as:  COREG Take 12.5 mg by mouth 2 (two) times daily with a meal.   feeding supplement (ENSURE ENLIVE) Liqd Take 237 mLs by mouth 2 (two) times daily between meals.   fluconazole 100 MG tablet Commonly known as:  DIFLUCAN Take 1 tablet (100 mg total) by mouth daily.   formoterol 20 MCG/2ML nebulizer solution Commonly known as:  PERFOROMIST Take 2 mLs (20 mcg total) by nebulization 2 (two) times daily.   furosemide 80 MG tablet Commonly known as:  LASIX Take 1  tablet (80 mg total) by mouth 2 (two) times daily.   gabapentin 300 MG capsule Commonly known as:  NEURONTIN Take 1 capsule (300 mg total) by mouth at bedtime.   ipratropium-albuterol 0.5-2.5 (3) MG/3ML Soln Commonly known as:  DUONEB Take 3 mLs by nebulization every 4 (four) hours as needed. DX:J44.9   LORazepam 0.5 MG tablet Commonly known as:  ATIVAN Take 1 tablet (0.5 mg total) by mouth 3 (three) times daily as needed for anxiety.   losartan 25 MG tablet Commonly known as:  COZAAR Take 25 mg by mouth daily.   megestrol 40 MG tablet Commonly known as:  MEGACE Take 1 tablet (40 mg total) by mouth daily.   multivitamin with minerals Tabs tablet Take 1 tablet by mouth daily.   nystatin 100000 UNIT/ML suspension Commonly known as:  MYCOSTATIN Use as directed 5 mLs (500,000 Units total) in the mouth or throat 4 (four) times daily.   omeprazole 20 MG capsule Commonly known as:  PRILOSEC Take 20 mg by mouth daily.   oxymetazoline 0.05 % nasal spray Commonly known as:  AFRIN Place 1 spray into both nostrils daily as needed for congestion.   predniSONE 10 MG tablet Commonly known as:  DELTASONE Take 1 tablet (10 mg total) by mouth daily with breakfast.   protein supplement Powd Take 12-18 g by mouth 3 (three) times daily with meals.   pseudoephedrine 30 MG tablet Commonly known as:  SUDAFED Take 30 mg by mouth daily as needed for congestion.  spironolactone 25 MG tablet Commonly known as:  ALDACTONE Take 1 tablet (25 mg total) by mouth daily.   tiZANidine 4 MG tablet Commonly known as:  ZANAFLEX Take 1 tablet (4 mg total) by mouth Nightly.   Vitamin D 2000 units Caps Take 2,000 Units by mouth every evening.

## 2018-05-16 NOTE — Telephone Encounter (Signed)
Ultimately the patient has free will and can refuse recommended medical care.  He would ideally do better with HH, but he can refuse.

## 2018-05-16 NOTE — Progress Notes (Deleted)
Tony Watson  [phone] [phone]) [phone]  ID: Tony Watson OB: Feb 01, 1951  MR#: [id number]  XBW#:[id number]  Patient Care Team: Owens Loffler, MD as PCP - General Wellington Hampshire, MD as PCP - Cardiology (Cardiology) Leonel Ramsay, MD (Infectious Diseases) [name], MD as Consulting Physician (Pulmonary Disease) Wellington Hampshire, MD as Consulting Physician (Cardiology)  CHIEF COMPLAINT: Bone marrow biopsy proven CMML-1.  INTERVAL HISTORY: Patient returns to clinic today for repeat laboratory work and routine 65-monthfollow-up.  He continues to have mild weight loss, but otherwise feels well. He continues to have persistent weakness and fatigue. He denies any fevers or night sweats. He has no neurologic complaints. He has no chest pain or shortness of breath. He denies any nausea, vomiting, constipation, or diarrhea.  He has no melena or hematochezia.  He has no urinary complaints.  Patient offers no further specific complaints today.    REVIEW OF SYSTEMS:   Review of Systems  Constitutional: Positive for malaise/fatigue and weight loss. Negative for diaphoresis and fever.  HENT: Negative.  Negative for sore throat.   Respiratory: Negative for cough and shortness of breath.   Cardiovascular: Negative.  Negative for chest pain and leg swelling.  Gastrointestinal: Negative.  Negative for abdominal pain, nausea and vomiting.  Genitourinary: Negative.  Negative for dysuria.  Musculoskeletal: Negative.  Negative for back pain.  Neurological: Positive for weakness. Negative for sensory change and focal weakness.  Endo/Heme/Allergies: Does not bruise/bleed easily.  Psychiatric/Behavioral: Negative.  The patient is not nervous/anxious.     As per HPI. Otherwise, a complete review of systems is negative.  PAST MEDICAL HISTORY: Past Medical History:  Diagnosis Date  . Arthritis   . CAD (coronary artery disease)    inferior [place] 11[place]99 [place] [facility] [place] [facility]: EF 50%, patent RCA stent. no obstructive disease. last myoview 2008: EF 42%, inferior infarct, no ischemia.   . Cancer ([place]    CMML [place]  . Chronic combined systolic (congestive) and diastolic (congestive) heart failure ([place]    a. 7/29019 Echo: EF 25-30%, sev diff HK. Mld MR. Mod dil LA.  .Marland KitchenCOPD (chronic obstructive pulmonary disease) ([place]   . Diabetic autonomic neuropathy associated with type 2 diabetes mellitus ([facility]lm 03/27/2015  . Diverticulosis 2002  . DM2 (diabetes mellitus, type 2) (HCC)    A1c 6.0% 12/11  . GERD (gastroesophageal reflux disease)   . HTN (hypertension)   . Hyperlipidemia   . Ischemic cardiomyopathy    mild with EF 42% on myoviews 2008.  . Medical non-compliance   . Nicotine dependence    chronic, active  . Smoker   . Spinal stenosis   . 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 TIA (transient ischemic attack)    hx; now s/p PFO closure 2004 [place]  . Ulcer     PAST SURGICAL HISTORY: Past Surgical History:  Procedure Laterality Date  . BACK SURGERY     patient denies-just lumbar punctures  . BRONCHOSCOPY    . CARDIAC CATHETERIZATION  11/99   CI per [facility]  . CATARACT EXTRACTION W/PHACO Right 11/24/2016   Procedure: CATARACT EXTRACTION PHACO AND INTRAOCULAR LENS PLACEMENT (IOC);  Surgeon: WBirder Robson MD;  Location: [facility] ORS;  Service: Ophthalmology;  Laterality: Right;  [place]:04.3 AP% 22.3 CDE 14.35 Fluid pack lot # [id number]  . CATARACT EXTRACTION W/PHACO Left 12/29/2016   Procedure: CATARACT EXTRACTION PHACO AND INTRAOCULAR LENS PLACEMENT (IOC);  Surgeon: Birder Robson, MD;  Location: [facility] ORS;  Service: Ophthalmology;  Laterality: Left;  [place] 00:52 AP% 18.5 CDE 9.67 Fluid pack lot # [id number]  . COLONOSCOPY    . COLONOSCOPY WITH PROPOFOL N/A 05/25/2017   Procedure: COLONOSCOPY WITH PROPOFOL;  Surgeon: Jonathon Bellows, MD;  Location: [facility] ENDOSCOPY;  Service: Gastroenterology;  Laterality: N/A;    . CORONARY ANGIOPLASTY     STENT  . CORONARY ARTERY BYPASS GRAFT     stent  . ESOPHAGOGASTRODUODENOSCOPY (EGD) WITH PROPOFOL N/A 05/25/2017   Procedure: ESOPHAGOGASTRODUODENOSCOPY (EGD) WITH PROPOFOL;  Surgeon: Jonathon Bellows, MD;  [facility] END[facility]COPY;  Service: Gastroenterology;  Laterality: N/A;  . EYE SURGERY    . FLEXIBLE BRONCHOSCOPY N/A 05/19/2017   Procedure: FLEXIBLE BRONCHOSCOPY;  Surgeon: Wilhelmina Mcardle, MD;  Location: [facility] ORS;  Service: Pulmonary;  Laterality: N/A;  . open heart surgery  2004   PFO repair  . RIGHT/LEFT HEART CATH AND CORONARY ANGIOGRAPHY N/A 05/03/2018   Procedure: RIGHT/LEFT HEART CATH AND CORONARY ANGIOGRAPHY;  Surgeon: Nelva Bush, MD;  Location: [facility] CV LAB;  Service: Cardiovascular;  Laterality: N/A;  . UPPER GI ENDOSCOPY      FAMILY HISTORY Family History  Problem Relation Age of Onset  . Coronary artery disease Unknown        family hx  . Breast cancer Unknown        1st egree relative <50  . Cancer Mother   . Heart disease Father        ADVANCED DIRECTIVES:    HEALTH MAINTENANCE: Social History   Tobacco Use  . Smoking status: Former Smoker    Packs/day: 0.50    Years: 45.00    Pack years: 22.50    Types: Cigarettes    Last attempt to quit: 01/25/2018    Years since quitting: 0.3  . Smokeless tobacco: Never Used  . Tobacco comment: 1 ppd +40 years  Substance Use Topics  . Alcohol use: Not Currently    Alcohol/week: 0.0 standard drinks    Comment: weekly but last dose 1 month  . Drug use: No     Colonoscopy:  PAP:  Bone density:  Lipid panel:  No Known Allergies  Current Outpatient Medications  Medication Sig Dispense Refill  . acetaminophen (TYLENOL) 500 MG tablet Take 1,000 mg by mouth daily as needed for moderate pain or headache.    . albuterol (PROVENTIL) (2.5 MG/3ML) 0.083% nebulizer solution Take 3 mLs (2.5 mg total) by nebulization every 4 (four) hours as needed for wheezing or shortness of  breath. 75 mL 0  . amoxicillin-clavulanate (AUGMENTIN) 875-125 MG tablet Take 1 tablet by mouth every 12 (twelve) hours. 19 tablet 9  . aspirin 81 MG chewable tablet Chew 1 tablet (81 mg total) by mouth daily. 30 tablet 0  . atorvastatin (LIPITOR) 40 MG tablet Take 1 tablet (40 mg total) by mouth daily at 6 PM. 30 tablet 0  . budesonide (PULMICORT) 0.5 MG/2ML nebulizer solution Take 2 mLs (0.5 mg total) by nebulization 2 (two) times daily. 1440 mL 0  . carvedilol (COREG) 6.25 MG tablet Take 12.5 mg by mouth 2 (two) times daily with a meal.    . Cholecalciferol (VITAMIN D) 2000 units CAPS Take 2,000 Units by mouth every evening.    . feeding supplement, ENSURE ENLIVE, (ENSURE ENLIVE) LIQD Take 237 mLs by mouth 2 (two) times daily between meals. 60 Bottle 0  . fluconazole (DIFLUCAN) 100 MG tablet Take 1 tablet (100 mg total) by mouth  daily. 5 tablet 0  . formoterol (PERFOROMIST) 20 MCG/2ML nebulizer solution Take 2 mLs (20 mcg total) by nebulization 2 (two) times daily. 500 mL 12  . furosemide (LASIX) 80 MG tablet Take 1 tablet (80 mg total) by mouth 2 (two) times daily. 60 tablet 0  . gabapentin (NEURONTIN) 300 MG capsule Take 1 capsule (300 mg total) by mouth at bedtime. 90 capsule 1  . ipratropium-albuterol (DUONEB) 0.5-2.5 (3) MG/3ML SOLN Take 3 mLs by nebulization every 4 (four) hours as needed. DX:J44.9 630 mL 5  . LORazepam (ATIVAN) 0.5 MG tablet Take 1 tablet (0.5 mg total) by mouth 3 (three) times daily as needed for anxiety. 60 tablet 1  . losartan (COZAAR) 25 MG tablet Take 25 mg by mouth daily.    . megestrol (MEGACE) 40 MG tablet Take 1 tablet (40 mg total) by mouth daily. 30 tablet 2  . Multiple Vitamin (MULTIVITAMIN WITH MINERALS) TABS tablet Take 1 tablet by mouth daily. 30 tablet 0  . nystatin (MYCOSTATIN) 100000 UNIT/ML suspension Use as directed 5 mLs (500,000 Units total) in the mouth or throat 4 (four) times daily. 473 mL 1  . omeprazole (PRILOSEC) 20 MG capsule Take 20 mg by  mouth daily.    Marland Kitchen oxymetazoline (AFRIN) 0.05 % nasal spray Place 1 spray into both nostrils daily as needed for congestion.    . predniSONE (DELTASONE) 10 MG tablet Take 1 tablet (10 mg total) by mouth daily with breakfast. 30 tablet 3  . protein supplement (RESOURCE BENEPROTEIN) POWD Take 12-18 g by mouth 3 (three) times daily with meals. 227 g 0  . pseudoephedrine (SUDAFED) 30 MG tablet Take 30 mg by mouth daily as needed for congestion.     Marland Kitchen spironolactone (ALDACTONE) 25 MG tablet Take 1 tablet (25 mg total) by mouth daily. 30 tablet 0  . tiZANidine (ZANAFLEX) 4 MG tablet Take 1 tablet (4 mg total) by mouth Nightly. 90 tablet 3  . vitamin C (VITAMIN C) 250 MG tablet Take 1 tablet (250 mg total) by mouth 2 (two) times daily. 30 tablet 0   No current facility-administered medications for this visit.     OBJECTIVE: There were no vitals filed for this visit.   There is no height or weight on file to calculate BMI.    ECOG FS:0 - Asymptomatic  General: Thin, no acute distress. Eyes: Pink conjunctiva, anicteric sclera. HEENT: Normocephalic, moist mucous membranes, clear oropharnyx. Lungs: Clear to auscultation bilaterally. Heart: Regular rate and rhythm. No rubs, murmurs, or gallops. Abdomen: Soft, nontender, nondistended. No organomegaly noted, normoactive bowel sounds. Musculoskeletal: No edema, cyanosis, or clubbing. Neuro: Alert, answering all questions appropriately. Cranial nerves grossly intact. Skin: No rashes or petechiae noted. Psych: Normal affect.  LAB RESULTS:  Lab Results  Component Value Date   NA 135 05/08/2018   K 4.3 05/08/2018   CL 93 (L) 05/08/2018   CO2 30 05/08/2018   GLUCOSE 118 (H) 05/08/2018   BUN 28 (H) 05/08/2018   CREATININE 0.58 (L) 05/08/2018   CALCIUM 9.6 05/08/2018   PROT 7.8 05/01/2018   ALBUMIN 3.8 05/01/2018   AST 56 (H) 05/01/2018   ALT 70 (H) 05/01/2018   ALKPHOS 78 05/01/2018   BILITOT 1.0 05/01/2018   GFRNONAA >60 05/08/2018   GFRAA  >60 05/08/2018    Lab Results  Component Value Date   WBC 22.6 (H) 05/08/2018   NEUTROABS 7.6 (H) 05/01/2018   HGB 15.1 05/08/2018   HCT 42.7 05/08/2018   MCV 95.4  05/08/2018   PLT 166 05/08/2018     STUDIES: Dg Chest 2 View  Result Date: 05/08/2018 CLINICAL DATA:  Shortness of breath. History of chronic myelogenous leukemia EXAM: CHEST - 2 VIEW COMPARISON:  May 01, 2018 FINDINGS: There is diffuse parenchymal lung fibrosis. There is no appreciable edema or consolidation. Heart size and pulmonary vascular normal. Patient is status post median sternotomy. No evident adenopathy. There are foci of coronary artery calcification. No blastic or lytic bone lesions evident. IMPRESSION: Diffuse fibrotic change in the lungs without edema or consolidation. Heart size normal. No adenopathy. Foci of coronary artery calcification noted. Electronically Signed   By: Lowella Grip III M.D.   On: 05/08/2018 11:55   Dg Chest 2 View  Result Date: 04/24/2018 CLINICAL DATA:  Shortness of breath 2 months worsening over the past few days with cough and congestion. Oxygen dependent. EXAM: CHEST - 2 VIEW COMPARISON:  03/24/2018 and 02/27/2018 FINDINGS: Lungs are adequately inflated demonstrate diffuse bilateral increased interstitial markings without change. No consolidation or effusion. Cardiomediastinal silhouette and remainder of the exam is unchanged. IMPRESSION: Stable chronic diffuse interstitial disease.  No acute findings. Electronically Signed   By: Marin Olp M.D.   On: 04/24/2018 15:22   US Venous Img Lower Bilateral  Result Date: 05/08/2018 CLINICAL DATA:  Bilateral lower extremity edema for 1 week EXAM: BILATERAL LOWER EXTREMITY VENOUS DUPLEX ULTRASOUND TECHNIQUE: Gray-scale sonography with graded compression, as well as color Doppler and duplex ultrasound were performed to evaluate the lower extremity deep venous systems from the level of the common femoral vein and including the common  femoral, femoral, profunda femoral, popliteal and calf veins including the posterior tibial, peroneal and gastrocnemius veins when visible. The superficial great saphenous vein was also interrogated. Spectral Doppler was utilized to evaluate flow at rest and with distal augmentation maneuvers in the common femoral, femoral and popliteal veins. COMPARISON:  None. FINDINGS: RIGHT LOWER EXTREMITY Common Femoral Vein: No evidence of thrombus. Normal compressibility, respiratory phasicity and response to augmentation. Saphenofemoral Junction: No evidence of thrombus. Normal compressibility and flow on color Doppler imaging. Profunda Femoral Vein: No evidence of thrombus. Normal compressibility and flow on color Doppler imaging. Femoral Vein: No evidence of thrombus. Normal compressibility, respiratory phasicity and response to augmentation. Popliteal Vein: No evidence of thrombus. Normal compressibility, respiratory phasicity and response to augmentation. Calf Veins: No evidence of thrombus. Normal compressibility and flow on color Doppler imaging. Superficial Great Saphenous Vein: No evidence of thrombus. Normal compressibility. Venous Reflux:  None. Other Findings:  None. LEFT LOWER EXTREMITY Common Femoral Vein: No evidence of thrombus. Normal compressibility, respiratory phasicity and response to augmentation. Saphenofemoral Junction: No evidence of thrombus. Normal compressibility and flow on color Doppler imaging. Profunda Femoral Vein: No evidence of thrombus. Normal compressibility and flow on color Doppler imaging. Femoral Vein: No evidence of thrombus. Normal compressibility, respiratory phasicity and response to augmentation. Popliteal Vein: No evidence of thrombus. Normal compressibility, respiratory phasicity and response to augmentation. Calf Veins: No evidence of thrombus. Normal compressibility and flow on color Doppler imaging. Superficial Great Saphenous Vein: No evidence of thrombus. Normal  compressibility. Venous Reflux:  None. Other Findings:  None. IMPRESSION: No evidence of deep venous thrombosis in either lower extremity. Electronically Signed   By: Lowella Grip III M.D.   On: 05/08/2018 11:53   Dg Chest Portable 1 View  Result Date: 05/01/2018 CLINICAL DATA:  Acute shortness of breath for 2-3 days. EXAM: PORTABLE CHEST 1 VIEW COMPARISON:  04/24/2018 chest radiograph, 05/13/2017  chest CT and prior studies FINDINGS: UPPER limits normal heart size and median sternotomy again noted. Diffuse interstitial opacities are again noted. No definite new pulmonary opacities noted. No pleural effusion, pneumothorax or acute bony abnormalities identified. IMPRESSION: No evidence of acute cardiopulmonary disease. Chronic interstitial lung disease. Electronically Signed   By: Margarette Canada M.D.   On: 05/01/2018 10:44    ASSESSMENT: Bone marrow biopsy proven CMML-1.  PLAN:    1. CMML: Bone marrow biopsy completed on August 30, 2015 confirmed the diagnosis. Repeat bone marrow biopsy on March 08, 2017 is essentially unchanged. Patient's absolute monocytosis has been greater than 1.0 and persistent since at least July 2014.  More recently it has trended up and is now 4.2.  The remainder of his blood work including BCR-ABL and ANA are negative or within normal limits. Previously, hepatitis C and HIV were also negative.  He possibly will need treatment with azacitidine or decitabine in the future, but not at this time.  He continues to have no evidence of end organ involvement. CT scans from February 24, 2017 revealed stable lung nodules.  Return to clinic in 3 months with repeat laboratory work and further evaluation.   2. Pulmonary nodules: Most recent CT scan revealed unchanged.  Continue follow-up with pulmonology as indicated. 3. Weight loss: CT scans and bone marrow biopsy as above. Thyroid panel within normal limits. Encouraged supplements such as Ensure and Boost. Continue Megace 40 mg daily.   Patient has been evaluated by pulmonology with bronchoscopy, GI with endoscopies, and infectious disease without any obvious etiology for his weight loss.  Appreciate dietary input.   Approximately 20 minutes was spent in discussion of which greater than 50% was consultation.   Patient expressed understanding and was in agreement with this plan. He also understands that He can call clinic at any time with any questions, concerns, or complaints.    Lloyd Huger, MD   05/16/2018 12:35 PM

## 2018-05-19 ENCOUNTER — Inpatient Hospital Stay: Payer: Medicare HMO

## 2018-05-19 ENCOUNTER — Telehealth: Payer: Self-pay | Admitting: Cardiovascular Disease

## 2018-05-19 ENCOUNTER — Telehealth: Payer: Self-pay | Admitting: *Deleted

## 2018-05-19 ENCOUNTER — Inpatient Hospital Stay: Payer: Medicare HMO | Admitting: Oncology

## 2018-05-19 NOTE — Telephone Encounter (Signed)
Gilmore Laroche from [facility] calling  States she saw patient today and patient had an elevated heart rate between 103-108 Patient has also been complaining of SOB, more than usual Wife has been giving patient sudafed twice a day - would like to know if medication could be causing some issues The reason patient has been taking sudafed is due to dry nose from the oxygen Gilmore Laroche would like to know if we could get a doctor to prescribe humidification for patient Patient will be seeing R. Dunn on 10/11 Please advise

## 2018-05-19 NOTE — Progress Notes (Signed)
Cardiology Office Note Date:  05/20/2018  Patient ID:  Tony Watson, Tony Watson 09/25/1950, MRN [id number] PCP:  Owens Loffler, MD  Cardiologist:  Dr. Rockey Situ, MD    Chief Complaint: Hospital follow up  History of Present Illness: Tony Watson is a 67 y.o. 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   Patient has several ED visits and hospital admission for COPD exacerbations in July, August, and September, 2019. During his July, 2019 admission for respiratory distress secondary to AECOPD and systolic CHF, he underwent echo that showed an EF of 25-30%, severe diffuse hypokinesis, not technically sufficient to allow for LV diastolic function, mild MR, moderately dilated left atrium, RVSF mildly reduced, PASP unable to be estimated, elevated CVP. He was diuresed and treated for AECOPD with a discharge weight of 62.1 kg. He was readmitted in mid 03/2018 with AECOPD and treated with steroids and nebs. His discharge weight was 64.8 kg. He was seen in the office in late 03/2018 for follow hospital follow up with a weight of 66 kg. Stress testing was advised at that time per patient wish given his cardiomyopathy. His Coreg was increased to 6.25 mg bid. He was continued on Lasix 20 mg daily. He was not on an ACEi/ARB/Entresto/spironolactone secondary to relative hypotension. He was seen in the ED on 04/24/18 with AECOPD and advised to follow up as an outpatient. He was most recently admitted to the hospital on 9/22  with acute on chronic respiratory failure secondary to ILD/AECOPD and HFrEF. He was treated with ABX, steroids, and nebs per IM. He was diuresed successfully with a discharge weight of 63.6 kg. He underwent R/LHC on 05/03/2018 that showed 3-vessel CAD with mild diffuse disease throughout the left main, mid to distal LAD 50% stenosis, distal LAD 60% stenosis, D2 80% stenosis, ramus 80% stenosis, mid RCA-1 90% stenosis, mid RCA-2 20% stenosis, patent mid RCA stent with mild ISR, post atrio 50%. The coronary arteries were overall moderately to severely calcified. Mildly to moderately elevated left heart, right heart, and pulmonary artery pressures. Mildly reduced cardiac output/index of 3.7/2 respectively. Aggressive medical therapy was advised with reservation of PCI to the RCA/ramus for refractory symptoms despite optimal medical therapy. He was discharged on ASA 81 mg, Lipitor 40 mg, Coreg 6.25 mg bid, Lasix 80 mg bid, losartan 12.5 mg, spironolactone 25 mg daily along with his non-cardiac medications.   He was seen by the [facility] on 10/4, with a weight of 64.5 kg, BP 125/72, HR 105 bpm. No changes were made. Saw PCP 05/16/2018 with a BP of 110/60, HR 115 bpm, weight 65.3 kg.   Labs: 05/16/2018: Na+ 130 (corrected to 132), K+ 5.1, glucose 202, SCr 0.64, WBC 9.9, HGB 13.5, PLT 99, AST 40, ALT 79   Patient has noted elevated heart rates into the low 100s bpm associated with Sudafed and nebulizer treatments.   He comes in accompanied by his wife today and is doing well from a cardiac perspective. Both the patient and his wife  are very pleased with the assistance with [facility]. He is tolerating all medications without issues. Daily weights have been stable at home. He is watching his salt intake and drinking ~ 2 L of fluids daily. No lower extremity edema, abdominal swelling, or early satiety. His orthopnea has improved. No chest pain. Patient's wife says "I just want to be able to sit on the porch with  him."  Past Medical History:  Diagnosis Date  . Arthritis   . CAD (coronary artery disease)    a. inf MI 11/99 (in [place]) w/ PCI to [facility]; b. [place] 2004 [facility]): EF 50%, patent RCA stent, no obs dz; c. [place] 2008: EF 42%, inf infarct, no ischemia; d. R/LHC 9/19: 3-v dz w/ mild dif dz LM, m-dLAD 50, dLAD 60, D2 80, RI 80, mRCA-1 90, mRCA-2 20, patent RCA stent, CO/CI 3.7/2  . Chronic combined systolic (congestive) and diastolic (congestive) heart failure ([place])    a. 7/29019 Echo: EF 25-30%, sev diff HK, mild MR mod dil LA, mildly reduced RVSF, elevated CVP  . Chronic respiratory failure with hypoxia (HCC)    a. on 2L supplemental oxygen via [place] followed by pulmonology  . CML (chronic myelocytic leukemia) ([place])   . COPD (chronic obstructive pulmonary disease) ([place])   . Diabetic autonomic neuropathy associated with type 2 diabetes mellitus ([place]) 03/27/2015  . Diverticulosis 2002  . GERD (gastroesophageal reflux disease)   . HTN (hypertension)   . Hyperlipidemia   . ILD (interstitial lung disease) ([place])   . Ischemic cardiomyopathy   . Medical non-compliance   . Smoker   . Spinal stenosis   . TIA (transient ischemic attack)    a. s/p PFO closure 2004 (in [place])  . Ulcer     Past Surgical History:  Procedure Laterality Date  . BACK SURGERY     patient denies-just lumbar punctures  . BRONCHOSCOPY    . CARDIAC CATHETERIZATION  11/99   CI per [facility]  . CATARACT EXTRACTION W/PHACO Right 11/24/2016   Procedure: CATARACT EXTRACTION PHACO AND INTRAOCULAR LENS PLACEMENT (IOC);  Surgeon: Birder Robson, MD;  Location: [facility] ORS;  Service: Ophthalmology;  Laterality: Right;  [place] 1:04.3 AP% 22.3 CDE 14.35 Fluid pack lot # [id number]  . CATARACT EXTRACTION W/PHACO Left 12/29/2016   Procedure: CATARACT EXTRACTION PHACO AND INTRAOCULAR LENS PLACEMENT (IOC);  Surgeon: Birder Robson, MD;  Location: [facility] ORS;  Service: Ophthalmology;  Laterality: Left;  [place] 00:52 AP% 18.5 CDE 9.67 Fluid pack lot # [id number]  .  COLONOSCOPY    . COLONOSCOPY WITH PROPOFOL N/A 05/25/2017   Procedure: COLONOSCOPY WITH PROPOFOL;  Surgeon: Jonathon Bellows, MD;  Location: [facility] ENDOSCOPY;  Service: Gastroenterology;  Laterality: N/A;  . CORONARY ANGIOPLASTY     STENT  . CORONARY ARTERY BYPASS GRAFT     stent  . ESOPHAGOGASTRODUODENOSCOPY (EGD) WITH PROPOFOL N/A 05/25/2017   Procedure: ESOPHAGOGASTRODUODENOSCOPY (EGD) WITH PROPOFOL;  Surgeon: Jonathon Bellows, MD;  Location: [facility] ENDOSCOPY;  Service: Gastroenterology;  Laterality: N/A;  . EYE SURGERY    . FLEXIBLE BRONCHOSCOPY N/A 05/19/2017   Procedure: FLEXIBLE BRONCHOSCOPY;  Surgeon: Wilhelmina Mcardle, MD;  Location: [facility] ORS;  Service: Pulmonary;  Laterality: N/A;  . open heart surgery  2004   PFO repair  . RIGHT/LEFT HEART CATH AND CORONARY ANGIOGRAPHY N/A 05/03/2018   Procedure: RIGHT/LEFT HEART CATH AND CORONARY ANGIOGRAPHY;  Surgeon: Nelva Bush, MD;  Location: [facility] CV LAB;  Service: Cardiovascular;  Laterality: N/A;  . UPPER GI ENDOSCOPY  Current Meds  Medication Sig  . acetaminophen (TYLENOL) 500 MG tablet Take 1,000 mg by mouth daily as needed for moderate pain or headache.  . albuterol (PROVENTIL) (2.5 MG/3ML) 0.083% nebulizer solution Take 3 mLs (2.5 mg total) by nebulization every 4 (four) hours as needed for wheezing or shortness of breath.  Marland Kitchen amoxicillin-clavulanate (AUGMENTIN) 875-125 MG tablet Take 1 tablet by mouth every 12 (twelve) hours.  Marland Kitchen aspirin 81 MG chewable tablet Chew 1 tablet (81 mg total) by mouth daily.  Marland Kitchen atorvastatin (LIPITOR) 40 MG tablet Take 1 tablet (40 mg total) by mouth daily at 6 PM.  . budesonide (PULMICORT) 0.5 MG/2ML nebulizer solution Take 2 mLs (0.5 mg total) by nebulization 2 (two) times daily.  . Cholecalciferol (VITAMIN D) 2000 units CAPS Take 2,000 Units by mouth every evening.  . feeding supplement, ENSURE ENLIVE, (ENSURE ENLIVE) LIQD Take 237 mLs by mouth 2 (two) times daily between meals.  . fluconazole  (DIFLUCAN) 100 MG tablet Take 1 tablet (100 mg total) by mouth daily.  . formoterol (PERFOROMIST) 20 MCG/2ML nebulizer solution Take 2 mLs (20 mcg total) by nebulization 2 (two) times daily.  . furosemide (LASIX) 80 MG tablet Take 1 tablet (80 mg total) by mouth 2 (two) times daily.  Marland Kitchen gabapentin (NEURONTIN) 300 MG capsule Take 1 capsule (300 mg total) by mouth at bedtime.  Marland Kitchen ipratropium-albuterol (DUONEB) 0.5-2.5 (3) MG/3ML SOLN Take 3 mLs by nebulization every 4 (four) hours as needed. DX:J44.9  . LORazepam (ATIVAN) 0.5 MG tablet Take 1 tablet (0.5 mg total) by mouth 3 (three) times daily as needed for anxiety.  . megestrol (MEGACE) 40 MG tablet Take 1 tablet (40 mg total) by mouth daily.  . Multiple Vitamin (MULTIVITAMIN WITH MINERALS) TABS tablet Take 1 tablet by mouth daily.  Marland Kitchen nystatin (MYCOSTATIN) 100000 UNIT/ML suspension Use as directed 5 mLs (500,000 Units total) in the mouth or throat 4 (four) times daily.  Marland Kitchen omeprazole (PRILOSEC) 20 MG capsule Take 20 mg by mouth daily.  Marland Kitchen oxymetazoline (AFRIN) 0.05 % nasal spray Place 1 spray into both nostrils daily as needed for congestion.  . predniSONE (DELTASONE) 10 MG tablet Take 1 tablet (10 mg total) by mouth daily with breakfast.  . protein supplement (RESOURCE BENEPROTEIN) POWD Take 12-18 g by mouth 3 (three) times daily with meals.  . pseudoephedrine (SUDAFED) 30 MG tablet Take 30 mg by mouth daily as needed for congestion.   Marland Kitchen spironolactone (ALDACTONE) 25 MG tablet Take 1 tablet (25 mg total) by mouth daily.  Marland Kitchen tiZANidine (ZANAFLEX) 4 MG tablet Take 1 tablet (4 mg total) by mouth Nightly.  . vitamin C (VITAMIN C) 250 MG tablet Take 1 tablet (250 mg total) by mouth 2 (two) times daily.  . [DISCONTINUED] carvedilol (COREG) 6.25 MG tablet Take 12.5 mg by mouth 2 (two) times daily with a meal.  . [DISCONTINUED] losartan (COZAAR) 25 MG tablet Take 25 mg by mouth daily.    Allergies:   Patient has no known allergies.   Social History:  The  patient  reports that he quit smoking about 3 months ago. His smoking use included cigarettes. He has a 22.50 pack-year smoking history. He has never used smokeless tobacco. He reports that he drank alcohol. He reports that he does not use drugs.   Family History:  The patient's family history includes Breast cancer in his unknown relative; Cancer in his mother; Coronary artery disease in his unknown relative; Heart disease in his father.  ROS:  Review of Systems  Constitutional: Positive for malaise/fatigue. Negative for chills, diaphoresis, fever and weight loss.  HENT: Negative for congestion.   Eyes: Negative for discharge and redness.  Respiratory: Positive for shortness of breath. Negative for cough, hemoptysis, sputum production and wheezing.        Stable SOB  Cardiovascular: Negative for chest pain, palpitations, orthopnea, claudication, leg swelling and PND.  Gastrointestinal: Negative for abdominal pain, blood in stool, heartburn, melena, nausea and vomiting.  Genitourinary: Negative for hematuria.  Musculoskeletal: Negative for falls and myalgias.  Skin: Negative for rash.  Neurological: Positive for weakness. Negative for dizziness, tingling, tremors, sensory change, speech change, focal weakness and loss of consciousness.  Endo/Heme/Allergies: Does not bruise/bleed easily.  Psychiatric/Behavioral: Negative for substance abuse. The patient is not nervous/anxious.   All other systems reviewed and are negative.    PHYSICAL EXAM:  VS:  BP 132/70 (BP Location: Left Arm, Patient Position: Sitting, Cuff Size: Normal)   Pulse (!) 115   Ht 5' 10.5" (1.791 m)   Wt 141 lb (64 kg)   BMI 19.95 kg/m  BMI: Body mass index is 19.95 kg/m.  Physical Exam  Constitutional: He is oriented to person, place, and time. He appears well-developed and well-nourished.  HENT:  Head: Normocephalic and atraumatic.  Eyes: Right eye exhibits no discharge. Left eye exhibits no discharge.  Neck:  Normal range of motion. No JVD present.  Cardiovascular: Normal rate, regular rhythm, S1 normal, S2 normal and normal heart sounds. Exam reveals no distant heart sounds, no friction rub, no midsystolic click and no opening snap.  No murmur heard. Pulmonary/Chest: Effort normal and breath sounds normal. No respiratory distress. He has no decreased breath sounds. He has no wheezes. He has no rales. He exhibits no tenderness.  Diffuse, coarse breath sounds  Abdominal: Soft. He exhibits no distension. There is no tenderness.  Musculoskeletal: He exhibits no edema.  Neurological: He is alert and oriented to person, place, and time.  Skin: Skin is warm and dry. No cyanosis. Nails show no clubbing.  Psychiatric: He has a normal mood and affect. His speech is normal and behavior is normal. Judgment and thought content normal.     EKG:  Was ordered and interpreted by me today. Shows sinus tachycardia, 108 bpm, RBBB  Recent Labs: 03/03/2018: TSH 1.930 05/01/2018: B Natriuretic Peptide 1,279.0 05/08/2018: Magnesium 2.1 05/16/2018: ALT 79; BUN 13; Creatinine, Ser 0.64; Hemoglobin 13.5; Platelets 99.0; Potassium 5.1; Sodium 130  No results found for requested labs within last 8760 hours.   Estimated Creatinine Clearance: 82.2 mL/min (by C-G formula based on SCr of 0.64 mg/dL).   Wt Readings from Last 3 Encounters:  05/20/18 141 lb (64 kg)  05/16/18 144 lb (65.3 kg)  05/13/18 142 lb 4 oz (64.5 kg)     Other studies reviewed: Additional studies/records reviewed today include: summarized above  ASSESSMENT AND PLAN:  1. CAD involving the native coronary arteries without angina: Has been without chest pain since his discharge. Recent R/LHC as above. Continue medical management with ASA, Coreg, and Lipitor. Aggressive risk factor modification and secondary prevention. Reserve intervention on the RCA/ramus for recurrent, refractory angina.   2. Chronic combined CHF/ICM: He does not appear grossly volume  up at this time. EF 25-30% by echo in 04/2018. Continue to optimize evidence-based medical therapy. Increase Coreg to 25 mg bid. Continue losartan 12.5 mg daily, spironolactone 25 mg daily, and Lasix 80 mg bid. Consider Entresto in place of losartan in follow up. I  did not make this change today given the escalation of Coreg as above. If his EF does not improve to > 35% following optimization of medical therapy in ~ 3 months +/- intervention on the RCA/ramus as noted above if needed, he will need to be referred to EP for possible evaluation of ICD. His mildly tachycardic heart rates are likely in the setting of Sudafed use and nebulizer therapy. He has been advised to discontinue Sudafed as detailed below. Consider changing albuterol to Xopenex, defer to pulmonology. If his tachycardic rates persist, would recommend optimization of his carvedilol as above prior to considering ivabradine. CHF education.   3. Chronic respiratory failure with hypoxia on supplemental oxygen secondary to ILD/COPD: Followed by pulmonology. Stable.   4. Sinus tachycardia: Likely in the setting of recent Sudafed use as well as albuterol nebulizer therapy and CHF. Stop Sudafed. He should talk with his pulmonologist regarding Xopenex over albuterol. Escalate Coreg as above.   5. HTN: Blood pressure is reasonably controlled today. Increase Coreg as above, otherwise continue losartan, Lasix, and spironolactone.   6. HLD: LDL from 2017 of 79. Goal LDL < 70. LFT mildly elevated 05/16/18 with an AST 40 and ALT 79. Remains on Lipitor 40 mg daily.   7. Medication management: Do not recommend the patient continue to use Sudafed and have instructed him to stop. He has been advised to contact prescribing provider for possible humidification of his supplemental oxygen. If he needs assistance with nasal congestion, he should seek advice of his PCP.  Disposition: F/u with Dr. Rockey Situ and an APP in 1 month.   Current medicines are reviewed at  length with the patient today.  The patient did not have any concerns regarding medicines.  Signed, Christell Faith, PA-C 05/20/2018 3:19 PM     [address], [place] [address] [phone]

## 2018-05-19 NOTE — Telephone Encounter (Signed)
Spoke with Gilmore Laroche at [facility] and she states that patient was taking sudafed and having a breathing treatment and heart rates were elevated. She did get patients medications written down for them to bring into his appointment tomorrow to see provider here in our office. She also wanted to know if we could order humidification with oxygen and requested that she please reach out to whomever ordered oxygen for those orders. She verbalized understanding with no further questions at this time.

## 2018-05-19 NOTE — Telephone Encounter (Signed)
Patient's wife [name] called @ 8:40 to cancel Lab/MD/ NUT with Joli appts. and stated that she will call back At a later date to R/S.

## 2018-05-20 ENCOUNTER — Encounter: Payer: Self-pay | Admitting: Physician Assistant

## 2018-05-20 ENCOUNTER — Encounter

## 2018-05-20 ENCOUNTER — Ambulatory Visit: Payer: Medicare HMO | Admitting: Physician Assistant

## 2018-05-20 VITALS — BP 132/70 | HR 115 | Ht 70.5 in | Wt 141.0 lb

## 2018-05-20 DIAGNOSIS — I25118 Atherosclerotic heart disease of native coronary artery with other forms of angina pectoris: Secondary | ICD-10-CM | POA: Diagnosis not present

## 2018-05-20 DIAGNOSIS — I2510 Atherosclerotic heart disease of native coronary artery without angina pectoris: Secondary | ICD-10-CM | POA: Diagnosis not present

## 2018-05-20 DIAGNOSIS — J449 Chronic obstructive pulmonary disease, unspecified: Secondary | ICD-10-CM

## 2018-05-20 DIAGNOSIS — J849 Interstitial pulmonary disease, unspecified: Secondary | ICD-10-CM

## 2018-05-20 DIAGNOSIS — E782 Mixed hyperlipidemia: Secondary | ICD-10-CM

## 2018-05-20 DIAGNOSIS — I5042 Chronic combined systolic (congestive) and diastolic (congestive) heart failure: Secondary | ICD-10-CM

## 2018-05-20 DIAGNOSIS — J9611 Chronic respiratory failure with hypoxia: Secondary | ICD-10-CM

## 2018-05-20 DIAGNOSIS — I10 Essential (primary) hypertension: Secondary | ICD-10-CM

## 2018-05-20 MED ORDER — CARVEDILOL 25 MG PO TABS: 25.0000 mg | ORAL_TABLET | Freq: Two times a day (BID) | ORAL | 3 refills | Status: DC

## 2018-05-20 MED ORDER — LOSARTAN POTASSIUM 25 MG PO TABS: 12.5000 mg | ORAL_TABLET | Freq: Every day | ORAL | 2 refills | Status: DC

## 2018-05-20 NOTE — Patient Instructions (Signed)
Medication Instructions:  Your physician has recommended you make the following change in your medication:  1- INCREASE Carvedilol to 25 mg by mouth two times a day. 2- DECREASE Losartan to 12.5 mg (half tablet) by mouth once a day.  If you need a refill on your cardiac medications before your next appointment, please call your pharmacy.   Lab work: Your physician recommends that you return for lab work in: [place].  If you have labs (blood work) drawn today and your tests are completely normal, you will receive your results only by: Marland Kitchen MyChart Message (if you have MyChart) OR . A paper copy in the mail If you have any lab test that is abnormal or we need to change your treatment, we will call you to review the results.  Testing/Procedures: none  Follow-Up: At [facility], you and your health needs are our priority.  As part of our continuing mission to provide you with exceptional heart care, we have created designated Provider Care Teams.  These Care Teams include your primary Cardiologist (physician) and Advanced Practice Providers (APPs -  Physician Assistants and Nurse Practitioners) who all work together to provide you with the care you need, when you need it. You will need a follow up appointment in 1 months.  Please call our office 2 months in advance to schedule this appointment.  You may see Kathlyn [place], MD or one of the following Advanced Practice Providers on your designated Care Team:   Murray Hodgkins, NP Christell Faith, PA-C . Marrianne Mood, PA-C

## 2018-05-21 LAB — BASIC METABOLIC PANEL
BUN/Creatinine Ratio: 27 — ABNORMAL HIGH (ref 10–24)
BUN: 16 mg/dL (ref 8–27)
CO2: 14 mmol/L — AB (ref 20–29)
CREATININE: 0.6 mg/dL — AB (ref 0.76–1.27)
Calcium: 9.1 mg/dL (ref 8.6–10.2)
Chloride: 97 mmol/L (ref 96–106)
GFR calc Af Amer: 121 mL/min/{1.73_m2} (ref >59)
GFR, EST NON AFRICAN AMERICAN: 105 mL/min/{1.73_m2} (ref >59)
Glucose: 225 mg/dL — ABNORMAL HIGH (ref 65–99)
Potassium: 5 mmol/L (ref 3.5–5.2)
Sodium: 131 mmol/L — ABNORMAL LOW (ref 134–144)

## 2018-05-23 ENCOUNTER — Telehealth: Payer: Self-pay | Admitting: *Deleted

## 2018-05-23 DIAGNOSIS — Z79899 Other long term (current) drug therapy: Secondary | ICD-10-CM

## 2018-05-23 DIAGNOSIS — I5023 Acute on chronic systolic (congestive) heart failure: Secondary | ICD-10-CM

## 2018-05-23 MED ORDER — ONDANSETRON 4 MG PO TBDP: 4.0000 mg | ORAL_TABLET | Freq: Three times a day (TID) | ORAL | 3 refills | Status: DC | PRN

## 2018-05-23 NOTE — Telephone Encounter (Signed)
zofran 4 mg ODT generic, 1 po q 8 hours prn nausea, #30, 3 ref

## 2018-05-23 NOTE — Telephone Encounter (Signed)
Copied from [place] [id number]. Topic: General - Other >> May 23, 2018  2:43 PM Carolyn Stare wrote:  Pt said he was in last week and now he is having some nausea and is asking if a RX can be called in   [place]

## 2018-05-23 NOTE — Telephone Encounter (Signed)
Results called to pt. Pt verbalized understanding. He verbalized understanding to go to the [facility] on October 28. BMET order entered. He is aware to follow up with PCP as well about blood sugar.

## 2018-05-23 NOTE — Telephone Encounter (Signed)
-----   Message from Theora Gianotti, NP sent at 05/23/2018 11:55 AM EDT ----- Renal fxn ok.  Lytes stable.  Potassium still @ high end of normal.  F/u bmet in 2 wks to ensure stability.  Glucose elevated - assume he was not fasting.  DM mgmt per PCP.

## 2018-05-23 NOTE — Telephone Encounter (Signed)
Z[facility]fran sent to [facility] [place] as instructed by Dr. Lorelei Pont.  Tony Watson notified by telephone.

## 2018-05-25 DIAGNOSIS — Z7951 Long term (current) use of inhaled steroids: Secondary | ICD-10-CM

## 2018-05-25 DIAGNOSIS — Z7982 Long term (current) use of aspirin: Secondary | ICD-10-CM

## 2018-05-25 DIAGNOSIS — Z79899 Other long term (current) drug therapy: Secondary | ICD-10-CM

## 2018-05-25 DIAGNOSIS — C9310 Chronic myelomonocytic leukemia not having achieved remission: Secondary | ICD-10-CM

## 2018-05-25 DIAGNOSIS — Z9981 Dependence on supplemental oxygen: Secondary | ICD-10-CM

## 2018-05-25 DIAGNOSIS — H6690 Otitis media, unspecified, unspecified ear: Secondary | ICD-10-CM

## 2018-05-25 DIAGNOSIS — Z7952 Long term (current) use of systemic steroids: Secondary | ICD-10-CM

## 2018-05-25 DIAGNOSIS — J849 Interstitial pulmonary disease, unspecified: Secondary | ICD-10-CM | POA: Diagnosis not present

## 2018-05-25 DIAGNOSIS — F17210 Nicotine dependence, cigarettes, uncomplicated: Secondary | ICD-10-CM

## 2018-05-25 DIAGNOSIS — Z792 Long term (current) use of antibiotics: Secondary | ICD-10-CM

## 2018-05-25 DIAGNOSIS — T82855D Stenosis of coronary artery stent, subsequent encounter: Secondary | ICD-10-CM

## 2018-05-25 DIAGNOSIS — J441 Chronic obstructive pulmonary disease with (acute) exacerbation: Secondary | ICD-10-CM | POA: Diagnosis not present

## 2018-05-25 DIAGNOSIS — I110 Hypertensive heart disease with heart failure: Secondary | ICD-10-CM

## 2018-05-25 DIAGNOSIS — I5043 Acute on chronic combined systolic (congestive) and diastolic (congestive) heart failure: Secondary | ICD-10-CM

## 2018-05-25 DIAGNOSIS — E1140 Type 2 diabetes mellitus with diabetic neuropathy, unspecified: Secondary | ICD-10-CM

## 2018-05-25 DIAGNOSIS — I2510 Atherosclerotic heart disease of native coronary artery without angina pectoris: Secondary | ICD-10-CM | POA: Diagnosis not present

## 2018-05-25 DIAGNOSIS — E785 Hyperlipidemia, unspecified: Secondary | ICD-10-CM

## 2018-05-25 DIAGNOSIS — Z955 Presence of coronary angioplasty implant and graft: Secondary | ICD-10-CM

## 2018-05-25 DIAGNOSIS — J9621 Acute and chronic respiratory failure with hypoxia: Secondary | ICD-10-CM

## 2018-06-07 ENCOUNTER — Other Ambulatory Visit: Payer: Self-pay | Admitting: Family Medicine

## 2018-06-07 ENCOUNTER — Other Ambulatory Visit
Admission: RE | Admit: 2018-06-07 | Discharge: 2018-06-07 | Disposition: A | Discharge status: Home / Self Care | Payer: Medicare HMO | Source: Ambulatory Visit | Attending: Nurse Practitioner | Admitting: Nurse Practitioner

## 2018-06-07 DIAGNOSIS — Z79899 Other long term (current) drug therapy: Secondary | ICD-10-CM

## 2018-06-07 DIAGNOSIS — I5023 Acute on chronic systolic (congestive) heart failure: Secondary | ICD-10-CM | POA: Insufficient documentation

## 2018-06-07 LAB — BASIC METABOLIC PANEL
ANION GAP: 12 (ref 5–15)
BUN: 18 mg/dL (ref 8–23)
CALCIUM: 9 mg/dL (ref 8.9–10.3)
CO2: 27 mmol/L (ref 22–32)
Chloride: 95 mmol/L — ABNORMAL LOW (ref 98–111)
Creatinine, Ser: 0.61 mg/dL (ref 0.61–1.24)
GFR calc Af Amer: >60 mL/min (ref >60)
GLUCOSE: 171 mg/dL — AB (ref 70–99)
Potassium: 4.3 mmol/L (ref 3.5–5.1)
SODIUM: 134 mmol/L — AB (ref 135–145)

## 2018-06-10 ENCOUNTER — Telehealth: Payer: Self-pay

## 2018-06-10 ENCOUNTER — Telehealth: Payer: Self-pay | Admitting: Internal Medicine

## 2018-06-10 MED ORDER — FORMOTEROL FUMARATE 20 MCG/2ML IN NEBU: 20.0000 ug | INHALATION_SOLUTION | Freq: Two times a day (BID) | RESPIRATORY_TRACT | 12 refills | Status: DC

## 2018-06-10 NOTE — Telephone Encounter (Signed)
°*  STAT* If patient is at the pharmacy, call can be transferred to refill team.   1. Which medications need to be refilled? (please list name of each medication and dose if known) Perforomist 20 mcg/2 ml neb BID  2. Which pharmacy/location (including street and city if local pharmacy) is medication to be sent to? [facility]   3. Do they need a 30 day or 90 day supply? [place]

## 2018-06-10 NOTE — Telephone Encounter (Signed)
Tony Watson [facility] signed) wanted to get status of refills pharmacy has requested for atorvastatin and perforomist. Advised pts wife should ck with Dr Leslye Peer for atorvastatin and Dr Mortimer Fries for perforomist. Tony Watson voiced understanding and nothing further needed.

## 2018-06-13 ENCOUNTER — Other Ambulatory Visit: Payer: Self-pay | Admitting: Cardiovascular Disease

## 2018-06-13 MED ORDER — SPIRONOLACTONE 25 MG PO TABS: 25.0000 mg | ORAL_TABLET | Freq: Every day | ORAL | 0 refills | Status: DC

## 2018-06-13 MED ORDER — ATORVASTATIN CALCIUM 40 MG PO TABS: 40.0000 mg | ORAL_TABLET | Freq: Every day | ORAL | 0 refills | Status: DC

## 2018-06-13 NOTE — Telephone Encounter (Signed)
°*  STAT* If patient is at the pharmacy, call can be transferred to refill team.   1. Which medications need to be refilled? (please list name of each medication and dose if known)    Spironolactone 25 mg po q day    Atorvastatin  40 mg po q day    2. Which pharmacy/location (including street and city if local pharmacy) is medication to be sent to?    [facility] [address] [place]   3. Do they need a 30 day or 90 day supply? [place]

## 2018-06-14 ENCOUNTER — Telehealth: Payer: Self-pay

## 2018-06-14 MED ORDER — SPIRONOLACTONE 25 MG PO TABS: 25.0000 mg | ORAL_TABLET | Freq: Every day | ORAL | 0 refills | Status: DC

## 2018-06-14 MED ORDER — ATORVASTATIN CALCIUM 40 MG PO TABS: 40.0000 mg | ORAL_TABLET | Freq: Every day | ORAL | 0 refills | Status: DC

## 2018-06-14 NOTE — Telephone Encounter (Signed)
Medication refill sent to mail order pt needs to keep appointment for further refills.

## 2018-06-15 NOTE — Telephone Encounter (Signed)
Received fax for humidification on oxygen concentrator at home which was refused in suture sign with instructions for them to please send order request to provider who ordered oxygen. Called and spoke with Bertram Gala to let her know that patients pulmonary provider should be the one signing. Advised that I would walk over to pulmonary office and give to them for signature. She verbalized understanding with no further questions.

## 2018-06-16 ENCOUNTER — Other Ambulatory Visit: Payer: Self-pay | Admitting: *Deleted

## 2018-06-16 ENCOUNTER — Telehealth: Payer: Self-pay

## 2018-06-16 MED ORDER — SPIRONOLACTONE 25 MG PO TABS: 25.0000 mg | ORAL_TABLET | Freq: Every day | ORAL | 0 refills | Status: DC

## 2018-06-16 MED ORDER — ATORVASTATIN CALCIUM 40 MG PO TABS: 40.0000 mg | ORAL_TABLET | Freq: Every day | ORAL | 0 refills | Status: DC

## 2018-06-16 NOTE — Telephone Encounter (Signed)
Duplicate phone note.

## 2018-06-16 NOTE — Telephone Encounter (Signed)
°*  STAT* If patient is at the pharmacy, call can be transferred to refill team.   1. Which medications need to be refilled? (please list name of each medication and dose if known)     Atorvastatin 40 mg po q d     Spironolactone 25 mg po q d   2. Which pharmacy/location (including street and city if local pharmacy) is medication to be sent to?     [facility] [address] [place] !!  3. Do they need a 30 day or 90 day supply? [place]

## 2018-06-16 NOTE — Telephone Encounter (Signed)
Requested Prescriptions   Signed Prescriptions Disp Refills  . atorvastatin (LIPITOR) 40 MG tablet 90 tablet 0    Sig: Take 1 tablet (40 mg total) by mouth daily at 6 PM.    Authorizing Provider: Minna Merritts    Ordering User: Eugenio Hoes, MARINA C  . spironolactone (ALDACTONE) 25 MG tablet 90 tablet 0    Sig: Take 1 tablet (25 mg total) by mouth daily.    Authorizing Provider: Minna Merritts    Ordering User: Britt Bottom

## 2018-06-16 NOTE — Telephone Encounter (Signed)
Requested Prescriptions   Signed Prescriptions Disp Refills  . atorvastatin (LIPITOR) 40 MG tablet 90 tablet 0    Sig: Take 1 tablet (40 mg total) by mouth daily at 6 PM.    Authorizing Provider: Minna Merritts    Ordering User: Eugenio Hoes, Manas Hickling C  . spironolactone (ALDACTONE) 25 MG tablet 90 tablet 0    Sig: Take 1 tablet (25 mg total) by mouth daily.    Authorizing Provider: Minna Merritts    Ordering User: Britt Bottom

## 2018-06-16 NOTE — Telephone Encounter (Signed)
I contacted [employer] to d/c Atorvastatin and Spironolactone. Pt [facility].

## 2018-06-21 ENCOUNTER — Other Ambulatory Visit: Payer: Self-pay | Admitting: Family Medicine

## 2018-06-21 ENCOUNTER — Encounter: Payer: Self-pay | Admitting: Physician Assistant

## 2018-06-21 ENCOUNTER — Ambulatory Visit (INDEPENDENT_AMBULATORY_CARE_PROVIDER_SITE_OTHER): Payer: Medicare HMO | Admitting: Physician Assistant

## 2018-06-21 VITALS — BP 100/50 | HR 98 | Ht 71.0 in | Wt 154.5 lb

## 2018-06-21 DIAGNOSIS — I5042 Chronic combined systolic (congestive) and diastolic (congestive) heart failure: Secondary | ICD-10-CM | POA: Diagnosis not present

## 2018-06-21 DIAGNOSIS — I255 Ischemic cardiomyopathy: Secondary | ICD-10-CM

## 2018-06-21 DIAGNOSIS — J9611 Chronic respiratory failure with hypoxia: Secondary | ICD-10-CM

## 2018-06-21 DIAGNOSIS — I10 Essential (primary) hypertension: Secondary | ICD-10-CM

## 2018-06-21 DIAGNOSIS — I2510 Atherosclerotic heart disease of native coronary artery without angina pectoris: Secondary | ICD-10-CM

## 2018-06-21 DIAGNOSIS — E782 Mixed hyperlipidemia: Secondary | ICD-10-CM

## 2018-06-21 MED ORDER — CARVEDILOL 12.5 MG PO TABS: 12.5000 mg | ORAL_TABLET | Freq: Two times a day (BID) | ORAL | 3 refills | Status: DC

## 2018-06-21 NOTE — Progress Notes (Signed)
Cardiology Office Note Date:  06/21/2018  Patient ID:  Tony Watson, Tony Watson 1951-04-28, MRN [id number] PCP:  Owens Loffler, MD  Cardiologist:  Dr. Rockey Situ, MD    Chief Complaint: Follow-up  History of Present Illness: Tony Watson is a 67 y.o. 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  Patient has several ED visits and hospital admission for COPD exacerbations in July, August, and September, 2019. During his July, 2019 admission for respiratory distress secondary to AECOPD and systolic CHF, he underwent echo that showed an EF of 25-30%, severe diffuse hypokinesis, not technically sufficient to allow for LV diastolic function, mild MR, moderately dilated left atrium, RVSF mildly reduced, PASP unable to be estimated, elevated CVP. He was diuresed and treated for AECOPD with a discharge weight of 62.1 kg. He was readmitted in mid 03/2018 with AECOPD and treated with steroids and nebs. His discharge weight was 64.8 kg. He was seen in the office in late 03/2018 for follow hospital follow up with a weight of 66 kg. Stress testing was advised at that time given his cardiomyopathy, though not completed. His Coreg was increased to 6.25 mg bid. He was continued on Lasix 20 mg daily. He was not on an ACEi/ARB/Entresto/spironolactone secondary to relative hypotension. He was seen in the ED on 04/24/18 with AECOPD and advised to follow up as an outpatient. He was most recently admitted to the hospital on 9/22 with acute on chronic respiratory failure secondary to ILD/AECOPD and HFrEF. He was treated with ABX, steroids, and nebs per IM. He was diuresed successfully with a  discharge weight of 63.6 kg. He underwent R/LHC on 05/03/2018 that showed 3-vessel CAD with mild diffuse disease throughout the left main, mid to distal LAD 50% stenosis, distal LAD 60% stenosis, D2 80% stenosis, ramus 80% stenosis, mid RCA-1 90% stenosis, mid RCA-2 20% stenosis, patent mid RCA stent with mild ISR, post atrio 50%. The coronary arteries were overall moderately to severely calcified. Mildly to moderately elevated left heart, right heart, and pulmonary artery pressures. Mildly reduced cardiac output/index of 3.7/2 respectively. Aggressive medical therapy was advised with reservation of PCI to the RCA/ramus for refractory symptoms despite optimal medical therapy.  He was seen by the [facility] on 10/4, with a weight of 64.5 kg, BP 125/72, HR 105 bpm. No changes were made. Saw PCP 05/16/2018 with a BP of 110/60, HR 115 bpm, weight 65.3 kg.  In follow-up on 05/20/2018 he was doing well from a cardiac perspective.  He had noted some elevated heart rates in the low 100s bpm associated with Sudafed and nebulizer usage.  He was tolerating medications.  Weights were stable at home.  He was watching his salt and fluid intake.  Weight was noted to be 64 kg.  He was without chest pain.  His Coreg was increased to 25 mg twice daily he was otherwise continued on losartan 12.5 mg, spironolactone 25 mg, and Lasix 80 mg twice daily.  He was advised to discontinue Sudafed.  Labs checked at that time showed a glucose of 225, serum creatinine 0.60, potassium 5.0.  Repeat labs on 10/29 showed a potassium of 4.3, serum  creatinine 0.61.  He comes in accompanied by his wife today and is doing well.  Appetite is significantly improved.  Patient and his wife indicate he is constantly grazing throughout the day.  Patient's weight today is up 13 pounds from visit on 05/20/2018 with a weight trend from 141 pounds (64 kg) to 154 pounds (70.1 kg) today.  Patient and wife do not feel this weight gain is fluid retention  and is more so weight gain as his appetite is significantly improved as above.  Patient's wife indicates his face is starting to fill out and his color "looks better."  However, patient has recently found love for lightly salted chips and salsa.  He states "they are like crack."  Weights at home have been stable around 150 pounds.  Since increasing his carvedilol to 25 mg twice daily he has noted some soft blood pressures typically in the low 209O systolic.  He has been out of spironolactone for the past 7 days.  Patient's wife has had a difficult time getting this refilled from the pharmacy as apparently the pharmacy is telling the patient that they have filled the medication when the patient's wife indicates they have not.  He is eating some foods that are higher in salt including ham.  He has no longer taking Sudafed.  Patient feels like he is continuing to significantly improved.  On 06/20/2018 he was actually able to go out into the garage and tinker.  He worked on some small projects for his daughter.  Following this he went for a right and the golf cart with her.  Patient indicates it has been approximately 12 months since he was able to do either of these things.  He remains on baseline supplemental oxygen at 4 L via nasal cannula.  No orthopnea, lower extremity swelling, abdominal distention, early satiety, or cough.  No chest pain.  He feels well today.  Past Medical History:  Diagnosis Date  . Arthritis   . CAD (coronary artery disease)    a. inf MI 11/99 (in [place]) w/ PCI to [facility]; b. [place] 2004 [facility]): EF 50%, patent RCA stent, no obs dz; c. [place] 2008: EF 42%, inf infarct, no ischemia; d. R/LHC 9/19: 3-v dz w/ mild dif dz LM, m-dLAD 50, dLAD 60, D2 80, RI 80, mRCA-1 90, mRCA-2 20, patent RCA stent, CO/CI 3.7/2  . Chronic combined systolic (congestive) and diastolic (congestive) heart failure ([place])    a. 7/29019 Echo: EF 25-30%, sev diff HK, mild MR mod dil LA, mildly reduced RVSF, elevated CVP  .  Chronic respiratory failure with hypoxia (HCC)    a. on 2L supplemental oxygen via [place] followed by pulmonology  . CML (chronic myelocytic leukemia) ([place])   . COPD (chronic obstructive pulmonary disease) ([place])   . Diabetic autonomic neuropathy associated with type 2 diabetes mellitus ([place]) 03/27/2015  . Diverticulosis 2002  . GERD (gastroesophageal reflux disease)   . HTN (hypertension)   . Hyperlipidemia   . ILD (interstitial lung disease) ([place])   . Ischemic cardiomyopathy   . Medical non-compliance   . Smoker   . Spinal stenosis   . TIA (transient ischemic attack)    a. s/p PFO closure 2004 (in [place])  . Ulcer     Past Surgical History:  Procedure Laterality Date  . BACK SURGERY     patient denies-just lumbar punctures  . BRONCHOSCOPY    . CARDIAC CATHETERIZATION  11/99   CI per PMH  . CATARACT EXTRACTION W/PHACO  Right 11/24/2016   Procedure: CATARACT EXTRACTION PHACO AND INTRAOCULAR LENS PLACEMENT (IOC);  Surgeon: Birder Robson, MD;  Location: [facility] ORS;  Service: Ophthalmology;  Laterality: Right;  [place] 1:04.3 AP% 22.3 CDE 14.35 Fluid pack lot # [id number]  . CATARACT EXTRACTION W/PHACO Left 12/29/2016   Procedure: CATARACT EXTRACTION PHACO AND INTRAOCULAR LENS PLACEMENT (IOC);  Surgeon: Birder Robson, MD;  Location: [facility] ORS;  Service: Ophthalmology;  Laterality: Left;  [place] 00:52 AP% 18.5 CDE 9.67 Fluid pack lot # [id number]  . COLONOSCOPY    . COLONOSCOPY WITH PROPOFOL N/A 05/25/2017   Procedure: COLONOSCOPY WITH PROPOFOL;  Surgeon: Jonathon Bellows, MD;  Location: [facility] ENDOSCOPY;  Service: Gastroenterology;  Laterality: N/A;  . CORONARY ANGIOPLASTY     STENT  . CORONARY ARTERY BYPASS GRAFT     stent  . ESOPHAGOGASTRODUODENOSCOPY (EGD) WITH PROPOFOL N/A 05/25/2017   Procedure: ESOPHAGOGASTRODUODENOSCOPY (EGD) WITH PROPOFOL;  Surgeon: Jonathon Bellows, MD;  Location: [facility] ENDOSCOPY;  Service: Gastroenterology;  Laterality: N/A;  . EYE SURGERY    . FLEXIBLE BRONCHOSCOPY N/A  05/19/2017   Procedure: FLEXIBLE BRONCHOSCOPY;  Surgeon: Wilhelmina Mcardle, MD;  Location: [facility] ORS;  Service: Pulmonary;  Laterality: N/A;  . open heart surgery  2004   PFO repair  . RIGHT/LEFT HEART CATH AND CORONARY ANGIOGRAPHY N/A 05/03/2018   Procedure: RIGHT/LEFT HEART CATH AND CORONARY ANGIOGRAPHY;  Surgeon: Nelva Bush, MD;  Location: [place] CV LAB;  Service: Cardiovascular;  Laterality: N/A;  . UPPER GI ENDOSCOPY      Current Meds  Medication Sig  . acetaminophen (TYLENOL) 500 MG tablet Take 1,000 mg by mouth daily as needed for moderate pain or headache.  . albuterol (PROVENTIL) (2.5 MG/3ML) 0.083% nebulizer solution Take 3 mLs (2.5 mg total) by nebulization every 4 (four) hours as needed for wheezing or shortness of breath.  Marland Kitchen amoxicillin-clavulanate (AUGMENTIN) 875-125 MG tablet Take 1 tablet by mouth every 12 (twelve) hours.  Marland Kitchen aspirin 81 MG chewable tablet Chew 1 tablet (81 mg total) by mouth daily.  Marland Kitchen atorvastatin (LIPITOR) 40 MG tablet Take 1 tablet (40 mg total) by mouth daily at 6 PM.  . budesonide (PULMICORT) 0.5 MG/2ML nebulizer solution Take 2 mLs (0.5 mg total) by nebulization 2 (two) times daily.  . carvedilol (COREG) 25 MG tablet Take 1 tablet (25 mg total) by mouth 2 (two) times daily.  . Cholecalciferol (VITAMIN D) 2000 units CAPS Take 2,000 Units by mouth every evening.  . feeding supplement, ENSURE ENLIVE, (ENSURE ENLIVE) LIQD Take 237 mLs by mouth 2 (two) times daily between meals.  . fluconazole (DIFLUCAN) 100 MG tablet Take 1 tablet (100 mg total) by mouth daily.  . formoterol (PERFOROMIST) 20 MCG/2ML nebulizer solution Take 2 mLs (20 mcg total) by nebulization 2 (two) times daily. J44.9  . furosemide (LASIX) 80 MG tablet Take 1 tablet (80 mg total) by mouth 2 (two) times daily.  Marland Kitchen gabapentin (NEURONTIN) 300 MG capsule Take 1 capsule (300 mg total) by mouth at bedtime.  . gabapentin (NEURONTIN) 300 MG capsule TAKE 1 CAPSULE (300 MG TOTAL) BY MOUTH AT  BEDTIME.  Marland Kitchen ipratropium-albuterol (DUONEB) 0.5-2.5 (3) MG/3ML SOLN Take 3 mLs by nebulization every 4 (four) hours as needed. DX:J44.9  . LORazepam (ATIVAN) 0.5 MG tablet Take 1 tablet (0.5 mg total) by mouth 3 (three) times daily as needed for anxiety.  Marland Kitchen losartan (COZAAR) 25 MG tablet Take 0.5 tablets (12.5 mg total) by mouth daily.  . megestrol (MEGACE) 40 MG tablet Take 1 tablet (40 mg  total) by mouth daily.  . Multiple Vitamin (MULTIVITAMIN WITH MINERALS) TABS tablet Take 1 tablet by mouth daily.  Marland Kitchen nystatin (MYCOSTATIN) 100000 UNIT/ML suspension Use as directed 5 mLs (500,000 Units total) in the mouth or throat 4 (four) times daily.  Marland Kitchen omeprazole (PRILOSEC) 20 MG capsule Take 20 mg by mouth daily.  . ondansetron (ZOFRAN ODT) 4 MG disintegrating tablet Take 1 tablet (4 mg total) by mouth every 8 (eight) hours as needed for nausea or vomiting.  Marland Kitchen oxymetazoline (AFRIN) 0.05 % nasal spray Place 1 spray into both nostrils daily as needed for congestion.  . predniSONE (DELTASONE) 10 MG tablet Take 1 tablet (10 mg total) by mouth daily with breakfast.  . protein supplement (RESOURCE BENEPROTEIN) POWD Take 12-18 g by mouth 3 (three) times daily with meals.  . pseudoephedrine (SUDAFED) 30 MG tablet Take 30 mg by mouth daily as needed for congestion.   Marland Kitchen spironolactone (ALDACTONE) 25 MG tablet Take 1 tablet (25 mg total) by mouth daily.  Marland Kitchen tiZANidine (ZANAFLEX) 4 MG tablet Take 1 tablet (4 mg total) by mouth Nightly.  . vitamin C (VITAMIN C) 250 MG tablet Take 1 tablet (250 mg total) by mouth 2 (two) times daily.    Allergies:   Patient has no known allergies.   Social History:  The patient  reports that he quit smoking about 4 months ago. His smoking use included cigarettes. He has a 22.50 pack-year smoking history. He has never used smokeless tobacco. He reports that he drank alcohol. He reports that he does not use drugs.   Family History:  The patient's family history includes Breast cancer in  his unknown relative; Cancer in his mother; Coronary artery disease in his unknown relative; Heart disease in his father.  ROS:   Review of Systems  Constitutional: Positive for malaise/fatigue. Negative for chills, diaphoresis, fever and weight loss.  HENT: Negative for congestion.   Eyes: Negative for discharge and redness.  Respiratory: Positive for shortness of breath. Negative for cough, hemoptysis, sputum production and wheezing.   Cardiovascular: Negative for chest pain, palpitations, orthopnea, claudication, leg swelling and PND.  Gastrointestinal: Negative for abdominal pain, blood in stool, heartburn, melena, nausea and vomiting.  Genitourinary: Negative for hematuria.  Musculoskeletal: Negative for falls and myalgias.  Skin: Negative for rash.  Neurological: Negative for dizziness, tingling, tremors, sensory change, speech change, focal weakness, loss of consciousness and weakness.  Endo/Heme/Allergies: Does not bruise/bleed easily.  Psychiatric/Behavioral: Negative for substance abuse. The patient is not nervous/anxious.   All other systems reviewed and are negative.    PHYSICAL EXAM:  VS:  BP (!) 100/50 (BP Location: Right Arm, Patient Position: Sitting, Cuff Size: Normal)   Pulse 98   Ht _0  (1.803 m)   Wt 154 lb 8 oz (70.1 kg)   BMI 21.55 kg/m  BMI: Body mass index is 21.55 kg/m.  Physical Exam  Constitutional: He is oriented to person, place, and time. He appears well-developed and well-nourished.  HENT:  Head: Normocephalic and atraumatic.  Eyes: Right eye exhibits no discharge. Left eye exhibits no discharge.  Neck: Normal range of motion. No JVD present.  Cardiovascular: Normal rate, regular rhythm, S1 normal, S2 normal and normal heart sounds. Exam reveals no distant heart sounds, no friction rub, no midsystolic click and no opening snap.  No murmur heard. Pulses:      Posterior tibial pulses are 2+ on the right side, and 2+ on the left side.    Pulmonary/Chest: Effort normal  and breath sounds normal. No respiratory distress. He has no decreased breath sounds. He has no wheezes. He has no rales. He exhibits no tenderness.  Abdominal: Soft. He exhibits no distension. There is no tenderness.  Musculoskeletal: He exhibits no edema.  Neurological: He is alert and oriented to person, place, and time.  Skin: Skin is warm and dry. No cyanosis. Nails show no clubbing.  Psychiatric: He has a normal mood and affect. His speech is normal and behavior is normal. Judgment and thought content normal.     EKG:  Was ordered and interpreted by me today. Shows NSR, 98 bpm, RBBB (unchanged from prior)  Recent Labs: 03/03/2018: TSH 1.930 05/01/2018: B Natriuretic Peptide 1,279.0 05/08/2018: Magnesium 2.1 05/16/2018: ALT 79; Hemoglobin 13.5; Platelets 99.0 06/07/2018: BUN 18; Creatinine, Ser 0.61; Potassium 4.3; Sodium 134  No results found for requested labs within last 8760 hours.   Estimated Creatinine Clearance: 90.1 mL/min (by C-G formula based on SCr of 0.61 mg/dL).   Wt Readings from Last 3 Encounters:  06/21/18 154 lb 8 oz (70.1 kg)  05/20/18 141 lb (64 kg)  05/16/18 144 lb (65.3 kg)     Other studies reviewed: Additional studies/records reviewed today include: summarized above  ASSESSMENT AND PLAN:  1. CAD involving the native coronary arteries without angina: He is doing well without any symptoms concerning for angina he was last seen.  Recent right and left cardiac cath as above.  Continue optimal medical therapy with aspirin, Coreg, and Lipitor.  Aggressive risk factor modification and secondary prevention.  Reserve intervention of the RCA/ramus for recurrent, refractory angina.  Offered patient cardiac rehab which both he and his wife are interested in this however, they would like to defer this over the next couple of months and revisit in the spring.  2. Chronic combined CHF/ICM: Patient's weight is up approximately 14 pounds today  by our office scale when compared to his last office visit 1 month prior.  However his weights at home have been stable around 150 pounds.  Patient and wife indicate his appetite has significantly picked up since he was last seen 1 month prior and they feel like this is actual weight gain rather than fluid retention.  He does not have any symptoms suggestive of volume overload at this time.  Since escalating his Coreg to 25 mg twice daily in 05/2017 his blood pressures have been on the soft side.  Because of this, we will decrease his Coreg to 12.5 mg twice daily.  He will remain on losartan 12.5 mg daily as well as Lasix 80 mg daily.  He has been out of spironolactone for the past 7 days and we will resume this at 25 mg daily.  We are unable to transition him from losartan to [facility] at this time given his relative hypotension.  We will plan to recheck a limited echocardiogram in mid to late 07/2018 after he has been on optimal heart failure medication regimen for 3 months.  If his EF does not improve greater than 35% following optimization of medical therapy with or without intervention of the RCA/ramus we will plan to refer the patient to EP for possible evaluation of ICD.  His tachycardic heart rates have improved since discontinuing his Sudafed.  No indication for ivabradine at this time.  CHF education.  3. Chronic respiratory failure with hypoxia on supplemental oxygen secondary to ILD/COPD: Stable.  Followed by pulmonology.  4. Hypertension: Blood pressure on the soft side today and has been  relatively soft at home since he was last seen.  As above, decrease Coreg to 12.5 mg twice daily.  He will otherwise continue Lasix 80 mg twice daily, losartan 12.5 mg daily, and restart spironolactone 25 mg daily.  5. Hyperlipidemia: Remains on Lipitor 40 mg daily.  Check fasting lipid panel in follow-up.  Disposition: F/u with Dr. Rockey Situ or APP in 1 month.   Current medicines are reviewed at length with the  patient today.  The patient did not have any concerns regarding medicines.  Signed, Christell Faith, PA-C 06/21/2018 3:10 PM     [address] [place], [place] [address] [phone]

## 2018-06-21 NOTE — Patient Instructions (Signed)
Medication Instructions:  Your physician has recommended you make the following change in your medication:  1- DECREASE Carvedilol to 12.5 mg by mouth two times a day. 2- RESUME Spironolactone 25 mg by mouth once a day.  If you need a refill on your cardiac medications before your next appointment, please call your pharmacy.   Lab work: Your physician recommends that you return for lab work in: [place] - [facility].  Your physician recommends that you return for lab work in: [place] (November 19) AT [place]. ([facility]) - Please go to the [facility]. You will check in at the front desk to the right as you walk into the atrium. Valet Parking is offered if needed.   If you have labs (blood work) drawn today and your tests are completely normal, you will receive your results only by: Marland Kitchen MyChart Message (if you have MyChart) OR . A paper copy in the mail If you have any lab test that is abnormal or we need to change your treatment, we will call you to review the results.  Testing/Procedures: none  Follow-Up: At [facility], you and your health needs are our priority.  As part of our continuing mission to provide you with exceptional heart care, we have created designated Provider Care Teams.  These Care Teams include your primary Cardiologist (physician) and Advanced Practice Providers (APPs -  Physician Assistants and Nurse Practitioners) who all work together to provide you with the care you need, when you need it. You will need a follow up appointment in 1 months with Christell Faith, PA-C.

## 2018-06-21 NOTE — Telephone Encounter (Signed)
Last office visit 05/16/2018 for hospital follow up.  Last refilled 03/23/2018 for #90 with 1 refill.  No future appointments.

## 2018-06-22 LAB — BASIC METABOLIC PANEL
BUN / CREAT RATIO: 26 — AB (ref 10–24)
BUN: 16 mg/dL (ref 8–27)
CALCIUM: 9.4 mg/dL (ref 8.6–10.2)
CO2: 26 mmol/L (ref 20–29)
CREATININE: 0.62 mg/dL — AB (ref 0.76–1.27)
Chloride: 93 mmol/L — ABNORMAL LOW (ref 96–106)
GFR calc Af Amer: 119 mL/min/{1.73_m2} (ref >59)
GFR calc non Af Amer: 103 mL/min/{1.73_m2} (ref >59)
GLUCOSE: 183 mg/dL — AB (ref 65–99)
Potassium: 3.8 mmol/L (ref 3.5–5.2)
Sodium: 136 mmol/L (ref 134–144)

## 2018-06-23 ENCOUNTER — Other Ambulatory Visit: Payer: Self-pay | Admitting: Family Medicine

## 2018-06-23 NOTE — Telephone Encounter (Signed)
Last office visit 05/16/2018 for hospital follow up.  Last refilled 03/23/2018 for 472 ml with 1 refill.  Ok to refill?

## 2018-06-23 NOTE — Progress Notes (Signed)
Patient ID: Tony Watson, male    DOB: Jan 31, 1951, 67 y.o.   MRN: [id number]  HPI  Tony Watson is a 67 y/o male with a history of CAD, DM, hyperlipidemia, HTN, TIA, GERD, COPD, spinal stenosis, ILD, previous tobacco use and chronic heart failure.   Echo report from 03/03/18 reviewed and showed an EF of 25-30% along with mild Tony.   Right / left cardiac catheterization done 05/03/18 which showed:  1. Three-vessel coronary artery disease with moderate to severe calcification predominantly affecting the ramus intermedius and RCA (80 to 90% stenoses).  There is moderate LAD disease as well as 80% stenosis of small D2 branch. 2. Patent mid RCA stent with mild in-stent restenosis. 3. Mildly to moderately elevated left heart, right heart, and pulmonary artery pressures. 4:   Mildly reduced Fick cardiac output/index. Aggressive medical management recommended  Admitted 05/01/18 due to acute HF exacerbation. Cardiology and palliative care consults were obtained. Initially needed IV lasix and then transitioned to oral diuretics. Nebulizer and solu-medrol given for COPD/ ILD before transitioning to oral prednisone. Sonogram negative for DVT. Discharged after 7 days with home health. Was in the ED 04/24/18 due to COPD exacerbation where he was treated and released. Admitted 03/24/18 due to COPD exacerbation. Received IV steroids and nebulizers and continued on oxygen. IV diuretics given and cardiology consult obtained. Discharged after 4 days.   He presents today for a follow-up visit with a chief complaint of minimal shortness of breath upon moderate exertion. He describes this as chronic in nature having been present for several years. He has associated fatigue, wheezing, easy bruising and gradual weight gain. He denies any abdominal distention, palpitations, pedal edema, chest pain or dizziness. He did eat chinese food last night and did gain a couple of pounds overnight. Has also been eating chips/ salsa. Had  to have his carvedilol reduced due to soft BP's, heart rate remains elevated.   Past Medical History:  Diagnosis Date  . Arthritis   . CAD (coronary artery disease)    a. inf MI 11/99 (in [place]) w/ PCI to [facility]; b. [place] 2004 [facility]): EF 50%, patent RCA stent, no obs dz; c. MV 2008: EF 42%, inf infarct, no ischemia; d. R/LHC 9/19: 3-v dz w/ mild dif dz LM, m-dLAD 50, dLAD 60, D2 80, RI 80, mRCA-1 90, mRCA-2 20, patent RCA stent, CO/CI 3.7/2  . Chronic combined systolic (congestive) and diastolic (congestive) heart failure ([place])    a. 7/29019 Echo: EF 25-30%, sev diff HK, mild Tony mod dil LA, mildly reduced RVSF, elevated CVP  . Chronic respiratory failure with hypoxia (HCC)    a. on 2L supplemental oxygen via [place] followed by pulmonology  . CML (chronic myelocytic leukemia) ([place])   . COPD (chronic obstructive pulmonary disease) ([place])   . Diabetic autonomic neuropathy associated with type 2 diabetes mellitus ([place]) 03/27/2015  . Diverticulosis 2002  . GERD (gastroesophageal reflux disease)   . HTN (hypertension)   . Hyperlipidemia   . ILD (interstitial lung disease) ([place])   . Ischemic cardiomyopathy   . Medical non-compliance   . Smoker   . Spinal stenosis   . TIA (transient ischemic attack)    a. s/p PFO closure 2004 (in [place])  . Ulcer    Past Surgical History:  Procedure Laterality Date  . BACK SURGERY     patient denies-just lumbar punctures  . BRONCHOSCOPY    . CARDIAC CATHETERIZATION  11/99   CI per [facility]  . CATARACT  EXTRACTION W/PHACO Right 11/24/2016   Procedure: CATARACT EXTRACTION PHACO AND INTRAOCULAR LENS PLACEMENT (IOC);  Surgeon: Birder Robson, MD;  Location: [facility] ORS;  Service: Ophthalmology;  Laterality: Right;  [place] 1:04.3 AP% 22.3 CDE 14.35 Fluid pack lot # [id number]  . CATARACT EXTRACTION W/PHACO Left 12/29/2016   Procedure: CATARACT EXTRACTION PHACO AND INTRAOCULAR LENS PLACEMENT (IOC);  Surgeon: Birder Robson, MD;  Location: [facility] ORS;  Service: Ophthalmology;   Laterality: Left;  [place] 00:52 AP% 18.5 CDE 9.67 Fluid pack lot # [id number]  . COLONOSCOPY    . COLONOSCOPY WITH PROPOFOL N/A 05/25/2017   Procedure: COLONOSCOPY WITH PROPOFOL;  Surgeon: Jonathon Bellows, MD;  Location: [facility];  Service: Gastroenterology;  Laterality: N/A;  . CORONARY ANGIOPLASTY     STENT  . CORONARY ARTERY BYPASS GRAFT     stent  . ESOPHAGOGASTRODUODENOSCOPY (EGD) WITH PROPOFOL N/A 05/25/2017   Procedure: ESOPHAGOGASTRODUODENOSCOPY (EGD) WITH PROPOFOL;  Surgeon: Jonathon Bellows, MD;  Location: [facility] END[facility]COPY;  Service: Gastroenterology;  Laterality: N/A;  . EYE SURGERY    . FLEXIBLE BRONCHOSCOPY N/A 05/19/2017   Procedure: FLEXIBLE BRONCHOSCOPY;  Surgeon: Wilhelmina Mcardle, MD;  Location: [facility] ORS;  Service: Pulmonary;  Laterality: N/A;  . open heart surgery  2004   PFO repair  . RIGHT/LEFT HEART CATH AND CORONARY ANGIOGRAPHY N/A 05/03/2018   Procedure: RIGHT/LEFT HEART CATH AND CORONARY ANGIOGRAPHY;  Surgeon: Nelva Bush, MD;  Location: [place] CV LAB;  Service: Cardiovascular;  Laterality: N/A;  . UPPER GI ENDOSCOPY     Family History  Problem Relation Age of Onset  . Coronary artery disease Unknown        family hx  . Breast cancer Unknown        1st egree relative <50  . Cancer Mother   . Heart disease Father    Social History   Tobacco Use  . Smoking status: Former Smoker    Packs/day: 0.50    Years: 45.00    Pack years: 22.50    Types: Cigarettes    Last attempt to quit: 01/25/2018    Years since quitting: 0.4  . Smokeless tobacco: Never Used  . Tobacco comment: 1 ppd +40 years  Substance Use Topics  . Alcohol use: Not Currently    Alcohol/week: 0.0 standard drinks    Comment: weekly but last dose 1 month   No Known Allergies  Prior to Admission medications   Medication Sig Start Date End Date Taking? Authorizing Provider  acetaminophen (TYLENOL) 500 MG tablet Take 1,000 mg by mouth daily as needed for moderate pain or headache.   Yes  [provider]  albuterol (PROVENTIL) (2.5 MG/3ML) 0.083% nebulizer solution Take 3 mLs (2.5 mg total) by nebulization every 4 (four) hours as needed for wheezing or shortness of breath. 03/06/18  Yes Vaughan Basta, MD  aspirin 81 MG chewable tablet Chew 1 tablet (81 mg total) by mouth daily. 05/09/18  Yes Loletha Grayer, MD  atorvastatin (LIPITOR) 40 MG tablet Take 1 tablet (40 mg total) by mouth daily at 6 PM. 06/16/18  Yes Gollan, Kathlene November, MD  budesonide (PULMICORT) 0.5 MG/2ML nebulizer solution Take 2 mLs (0.5 mg total) by nebulization 2 (two) times daily. 03/06/18 03/06/19 Yes Vaughan Basta, MD  carvedilol (COREG) 12.5 MG tablet Take 1 tablet (12.5 mg total) by mouth 2 (two) times daily. 06/21/18 09/19/18 Yes Dunn, Areta Haber, PA-C  cetirizine (ZYRTEC) 10 MG chewable tablet Chew 10 mg by mouth daily.   Yes [provider]  Cholecalciferol (VITAMIN  D) 2000 units CAPS Take 2,000 Units by mouth every evening.   Yes [provider]  feeding supplement, ENSURE ENLIVE, (ENSURE ENLIVE) LIQD Take 237 mLs by mouth 2 (two) times daily between meals. 05/08/18  Yes Wieting, Richard, MD  fluconazole (DIFLUCAN) 100 MG tablet Take 1 tablet (100 mg total) by mouth daily. 05/09/18  Yes Wieting, Richard, MD  formoterol (PERFOROMIST) 20 MCG/2ML nebulizer solution Take 2 mLs (20 mcg total) by nebulization 2 (two) times daily. J44.9 06/10/18  Yes [name], MD  furosemide (LASIX) 80 MG tablet Take 1 tablet (80 mg total) by mouth 2 (two) times daily. 05/08/18  Yes Wieting, Richard, MD  gabapentin (NEURONTIN) 300 MG capsule Take 1 capsule (300 mg total) by mouth at bedtime. 03/23/18  Yes Copland, Frederico Hamman, MD  gabapentin (NEURONTIN) 300 MG capsule TAKE 1 CAPSULE (300 MG TOTAL) BY MOUTH AT BEDTIME. 06/21/18  Yes Bedsole, Amy E, MD  ipratropium-albuterol (DUONEB) 0.5-2.5 (3) MG/3ML SOLN Take 3 mLs by nebulization every 4 (four) hours as needed. DX:J44.9 05/10/18  Yes Kasa, Maretta Bees, MD   LORazepam (ATIVAN) 0.5 MG tablet Take 1 tablet (0.5 mg total) by mouth 3 (three) times daily as needed for anxiety. 05/11/18  Yes Copland, Frederico Hamman, MD  losartan (COZAAR) 25 MG tablet Take 0.5 tablets (12.5 mg total) by mouth daily. 05/20/18  Yes Dunn, Areta Haber, PA-C  megestrol (MEGACE) 40 MG tablet Take 1 tablet (40 mg total) by mouth daily. 04/26/17  Yes Lloyd Huger, MD  Multiple Vitamin (MULTIVITAMIN WITH MINERALS) TABS tablet Take 1 tablet by mouth daily. 03/07/18  Yes Vaughan Basta, MD  nystatin (MYCOSTATIN) 100000 UNIT/ML suspension TAKE AS DIRECTED 5 MLS (500,000 UNITS TOTAL) IN THE MOUTH OR THROAT 4 TIMES DAILY 06/23/18  Yes Bedsole, Amy E, MD  omeprazole (PRILOSEC) 20 MG capsule Take 20 mg by mouth daily.   Yes [provider]  ondansetron (ZOFRAN ODT) 4 MG disintegrating tablet Take 1 tablet (4 mg total) by mouth every 8 (eight) hours as needed for nausea or vomiting. 05/23/18  Yes Copland, Frederico Hamman, MD  oxymetazoline (AFRIN) 0.05 % nasal spray Place 1 spray into both nostrils daily as needed for congestion.   Yes [provider]  predniSONE (DELTASONE) 10 MG tablet Take 1 tablet (10 mg total) by mouth daily with breakfast. 04/29/18  Yes Copland, Frederico Hamman, MD  protein supplement (RESOURCE BENEPROTEIN) POWD Take 12-18 g by mouth 3 (three) times daily with meals. 03/06/18  Yes Vaughan Basta, MD  spironolactone (ALDACTONE) 25 MG tablet Take 1 tablet (25 mg total) by mouth daily. 06/16/18  Yes Gollan, Kathlene November, MD  tiZANidine (ZANAFLEX) 4 MG tablet Take 1 tablet (4 mg total) by mouth Nightly. 03/23/18  Yes Copland, Frederico Hamman, MD  vitamin C (VITAMIN C) 250 MG tablet Take 1 tablet (250 mg total) by mouth 2 (two) times daily. 03/06/18  Yes Vaughan Basta, MD  pseudoephedrine (SUDAFED) 30 MG tablet Take 30 mg by mouth daily as needed for congestion.     [provider]    Review of Systems  Constitutional: Positive for fatigue. Negative for appetite  change.  HENT: Negative for congestion, postnasal drip and sore throat.   Eyes: Negative.   Respiratory: Positive for shortness of breath (with moderate exertion) and wheezing. Negative for chest tightness.   Cardiovascular: Negative for chest pain, palpitations and leg swelling.  Gastrointestinal: Negative for abdominal distention and abdominal pain.  Endocrine: Negative.   Genitourinary: Negative.   Musculoskeletal: Negative for back pain and neck  pain.  Skin: Negative.   Allergic/Immunologic: Negative.   Neurological: Negative for dizziness and light-headedness.  Hematological: Negative for adenopathy. Bruises/bleeds easily.  Psychiatric/Behavioral: Negative for dysphoric mood and sleep disturbance. The patient is nervous/anxious.    Vitals:   06/24/18 1248  BP: 118/73  Pulse: (!) 103  Resp: 18  SpO2: 93%  Weight: 157 lb 4 oz (71.3 kg)  Height: _0  (1.803 m)   Wt Readings from Last 3 Encounters:  06/24/18 157 lb 4 oz (71.3 kg)  06/21/18 154 lb 8 oz (70.1 kg)  05/20/18 141 lb (64 kg)   Lab Results  Component Value Date   CREATININE 0.62 (L) 06/21/2018   CREATININE 0.61 06/07/2018   CREATININE 0.60 (L) 05/20/2018    Physical Exam  Constitutional: He is oriented to person, place, and time. He appears well-developed and well-nourished.  HENT:  Head: Normocephalic and atraumatic.  Neck: Normal range of motion. Neck supple. No JVD present.  Cardiovascular: Regular rhythm. Tachycardia present.  Pulmonary/Chest: Effort normal. He has no wheezes. He has no rales.  Abdominal: Soft. He exhibits no distension.  Musculoskeletal: He exhibits no edema or tenderness.  Neurological: He is alert and oriented to person, place, and time.  Skin: Skin is warm and dry.  Psychiatric: He has a normal mood and affect. His behavior is normal. Thought content normal.  Nursing note and vitals reviewed.   Assessment & Plan:  1: Chronic heart failure with reduced ejection fraction- -  NYHA class II - euvolemic today - weighing daily and he was reminded to call for an overnight weight gain of >2 pounds or a weekly weight gain of >5 pounds - weight up 15 pounds since his last visit here ~ 5 weeks ago - not adding salt to his food and his wife rinses canned foods and tries to read food labels. Reviewed the importance of closely following a 2070m sodium diet as he's recently ate [place]. Has also been eating chips/ salsa recently and says that it's become "addicting".  - saw cardiology (Dunn) 06/21/18 & had carvedilol reduced due to soft BP's - due to continued tachycardia and inability to maximize carvedilol use due to BP's, will try corlanor 540mBID - EKG done in the office today shows RBBB which appears to be unchanged - 28 samples of corlanor given and he was instructed to start taking it twice daily - has received his flu vaccine for this season - BNP 05/01/18 was 1279.0  2: HTN- - BP looks good although on the low side  - saw PCP (Copland) 05/16/18 - BMP 06/21/18 reviewed and showed sodium 136, potassium 3.8, creatinine 0.62 and GFR 103  3: ILD/ COPD- - [place]  Medication list was reviewed.   Return in 2 weeks or sooner for any questions/problems before then.

## 2018-06-24 ENCOUNTER — Encounter: Payer: Self-pay | Admitting: Family

## 2018-06-24 ENCOUNTER — Telehealth: Payer: Self-pay

## 2018-06-24 ENCOUNTER — Telehealth: Payer: Self-pay | Admitting: Internal Medicine

## 2018-06-24 ENCOUNTER — Ambulatory Visit: Payer: Medicare HMO | Attending: Family | Admitting: Family

## 2018-06-24 VITALS — BP 118/73 | HR 103 | Resp 18 | Ht 71.0 in | Wt 157.2 lb

## 2018-06-24 DIAGNOSIS — I509 Heart failure, unspecified: Secondary | ICD-10-CM | POA: Diagnosis present

## 2018-06-24 DIAGNOSIS — I5022 Chronic systolic (congestive) heart failure: Secondary | ICD-10-CM | POA: Diagnosis not present

## 2018-06-24 DIAGNOSIS — Z951 Presence of aortocoronary bypass graft: Secondary | ICD-10-CM | POA: Insufficient documentation

## 2018-06-24 DIAGNOSIS — J849 Interstitial pulmonary disease, unspecified: Secondary | ICD-10-CM | POA: Diagnosis not present

## 2018-06-24 DIAGNOSIS — I10 Essential (primary) hypertension: Secondary | ICD-10-CM

## 2018-06-24 DIAGNOSIS — Z79899 Other long term (current) drug therapy: Secondary | ICD-10-CM | POA: Diagnosis not present

## 2018-06-24 DIAGNOSIS — Z9842 Cataract extraction status, left eye: Secondary | ICD-10-CM | POA: Diagnosis not present

## 2018-06-24 DIAGNOSIS — Z8249 Family history of ischemic heart disease and other diseases of the circulatory system: Secondary | ICD-10-CM | POA: Diagnosis not present

## 2018-06-24 DIAGNOSIS — E1143 Type 2 diabetes mellitus with diabetic autonomic (poly)neuropathy: Secondary | ICD-10-CM | POA: Insufficient documentation

## 2018-06-24 DIAGNOSIS — Z856 Personal history of leukemia: Secondary | ICD-10-CM | POA: Diagnosis not present

## 2018-06-24 DIAGNOSIS — Z961 Presence of intraocular lens: Secondary | ICD-10-CM | POA: Diagnosis not present

## 2018-06-24 DIAGNOSIS — Z7951 Long term (current) use of inhaled steroids: Secondary | ICD-10-CM | POA: Diagnosis not present

## 2018-06-24 DIAGNOSIS — Z8673 Personal history of transient ischemic attack (TIA), and cerebral infarction without residual deficits: Secondary | ICD-10-CM | POA: Insufficient documentation

## 2018-06-24 DIAGNOSIS — K219 Gastro-esophageal reflux disease without esophagitis: Secondary | ICD-10-CM | POA: Diagnosis not present

## 2018-06-24 DIAGNOSIS — I2510 Atherosclerotic heart disease of native coronary artery without angina pectoris: Secondary | ICD-10-CM | POA: Diagnosis not present

## 2018-06-24 DIAGNOSIS — Z87891 Personal history of nicotine dependence: Secondary | ICD-10-CM | POA: Insufficient documentation

## 2018-06-24 DIAGNOSIS — J449 Chronic obstructive pulmonary disease, unspecified: Secondary | ICD-10-CM | POA: Insufficient documentation

## 2018-06-24 DIAGNOSIS — Z9841 Cataract extraction status, right eye: Secondary | ICD-10-CM | POA: Diagnosis not present

## 2018-06-24 DIAGNOSIS — J9611 Chronic respiratory failure with hypoxia: Secondary | ICD-10-CM | POA: Diagnosis not present

## 2018-06-24 DIAGNOSIS — Z955 Presence of coronary angioplasty implant and graft: Secondary | ICD-10-CM | POA: Diagnosis not present

## 2018-06-24 DIAGNOSIS — I252 Old myocardial infarction: Secondary | ICD-10-CM | POA: Diagnosis not present

## 2018-06-24 DIAGNOSIS — I255 Ischemic cardiomyopathy: Secondary | ICD-10-CM | POA: Diagnosis not present

## 2018-06-24 DIAGNOSIS — Z7982 Long term (current) use of aspirin: Secondary | ICD-10-CM | POA: Diagnosis not present

## 2018-06-24 DIAGNOSIS — I110 Hypertensive heart disease with heart failure: Secondary | ICD-10-CM | POA: Insufficient documentation

## 2018-06-24 DIAGNOSIS — E785 Hyperlipidemia, unspecified: Secondary | ICD-10-CM | POA: Insufficient documentation

## 2018-06-24 MED ORDER — IVABRADINE HCL 5 MG PO TABS: 5.0000 mg | ORAL_TABLET | Freq: Two times a day (BID) | ORAL | 0 refills | Status: DC

## 2018-06-24 NOTE — Telephone Encounter (Signed)
[place] with [facility] said notifying PCP pt missed [facility] nurse visit today; pts wife said had other appts and not a good day to come out. Varney Biles will restart [facility] nurse visits next week. FYI to Dr Lorelei Pont.

## 2018-06-24 NOTE — Patient Instructions (Signed)
Continue weighing daily and call for an overnight weight gain of > 2 pounds or a weekly weight gain of >5 pounds. 

## 2018-06-24 NOTE — Telephone Encounter (Signed)
lmov to change appt 11/19 due to kasa at hospital   New appt time 11/19 at 330 pm

## 2018-06-28 ENCOUNTER — Encounter: Payer: Self-pay | Admitting: Internal Medicine

## 2018-06-28 ENCOUNTER — Ambulatory Visit (INDEPENDENT_AMBULATORY_CARE_PROVIDER_SITE_OTHER): Payer: Medicare HMO | Admitting: Internal Medicine

## 2018-06-28 ENCOUNTER — Other Ambulatory Visit: Payer: Self-pay | Admitting: Internal Medicine

## 2018-06-28 VITALS — BP 116/62 | HR 83 | Ht 71.0 in | Wt 158.6 lb

## 2018-06-28 DIAGNOSIS — R0981 Nasal congestion: Secondary | ICD-10-CM | POA: Diagnosis not present

## 2018-06-28 DIAGNOSIS — J449 Chronic obstructive pulmonary disease, unspecified: Secondary | ICD-10-CM

## 2018-06-28 DIAGNOSIS — J9611 Chronic respiratory failure with hypoxia: Secondary | ICD-10-CM | POA: Diagnosis not present

## 2018-06-28 MED ORDER — FLUTICASONE PROPIONATE 50 MCG/ACT NA SUSP: 1.0000 | Freq: Every day | NASAL | 2 refills | Status: DC

## 2018-06-28 MED ORDER — TRIAMCINOLONE ACETONIDE 55 MCG/ACT NA AERO: 1.0000 | INHALATION_SPRAY | Freq: Two times a day (BID) | NASAL | 2 refills | Status: DC

## 2018-06-28 NOTE — Patient Instructions (Addendum)
Use mask with NEB therapy  Start Nasacort, use Vaseline for moisture  Continue Oxygen as prescribed  Continue Inhalers as prescibed

## 2018-06-28 NOTE — Progress Notes (Signed)
Name: Tony Watson MRN: [id number] DOB: [birth year]     CONSULTATION DATE: 06/28/2018   REFERRING MD : Tony Watson  CHIEF COMPLAINT: follow up COPD  STUDIES:  CT chest 02/2017 I have Independently reviewed images of  CT chest  Interpretation: Bilateral multifocal micronodular densities and opacifications noted   Patient underwent diagnostic bronchoscopy  05/19/17 Patient's pathology and diagnosis is consistent with respiratory bronchiolitis No evidence of malignancy final diagnosis is consistent with pigmented macrophages consistent with respiratory bronchiolitis All infectious disease studies have been negative  Pathology reports discussed with patient   PREVIOUS OV Patient underwent diagnostic bronchoscopy last week and shown to have respiratory bronchiolitis which is heavily consistent with smoking ECHO 02/2018 The cavity size was mildly dilated. Systolic   function was severely reduced. The estimated ejection fraction   was in the range of 25% to 30%. Severe diffuse hypokinesis.   Anterior wall motion best preserved. The study is not technically   sufficient to allow evaluation of LV diastolic function. - Mitral valve: There was mild regurgitation. - Left atrium: The atrium was moderately dilated. - Right ventricle: Systolic function was mildly reduced. - Pulmonary arteries: Systolic pressure could not be accurately   estimated. - Inferior vena cava: The vessel was dilated. The respirophasic   diameter changes were blunted (< 50%), consistent with elevated   central venous pressure. Patient with new findings on echocardiogram of EF of 25 to 30% pulmonary function testing on 05/13/17 FEV1 is 2.3 L which is 65% predicted FVC is 3.4 L which is 76% predicted ratio is 68 TLC is 14 L which is 208% predicted RV is 10.8 L which is 42% predicted DLCO is abnormal for volumes adjusted Patient has moderate to severe obstructive pulmonary disease along with hyperinflation and air  trapping with abnormal diffusion capacity   Pathological results confirm respiratory bronchiolitis with pigmented macrophages on the diagnostic bronchoscopy which was performed on 05/19/17   CC Follow up COPD   HPI  Patient has chronic debilitating COPD and CHF chronic SOB and DOE He uses nebulized therapy for his regimen  Uses oxygen therapy continuously and benefits from usage He needs oxygen to survive  No signs of infection at this time  no signs of acute heart failure at this time  Patient has progressive worsening of shortness of breath and dyspnea on exertion but seems to have stabilized over the last month   Patient sees cardiology for end-stage CHF  Patient has a complaint of sinus congestion Symptoms he has nosebleeds  Sometimes his oxygen therapy with nasal cannula is displaced when he wakes up in the morning      Review of Systems:  Gen:  Denies  fever, sweats, chills weigh loss  HEENT: Denies blurred vision, double vision, ear pain, eye pain, hearing loss, nose bleeds, sore throat Cardiac:  No dizziness, chest pain or heaviness, chest tightness,edema, No JVD Resp:   No cough, -sputum production, +shortness of breath,-wheezing, -hemoptysis,  Gi: Denies swallowing difficulty, stomach pain, nausea or vomiting, diarrhea, constipation, bowel incontinence Gu:  Denies bladder incontinence, burning urine Ext:   Denies Joint pain, stiffness or swelling Skin: Denies  skin rash, easy bruising or bleeding or hives Endoc:  Denies polyuria, polydipsia , polyphagia or weight change Psych:   Denies depression, insomnia or hallucinations  Other:  All other systems negative    VITAL SIGNS: Ht 5\' 11"  (1.803 m)   Wt 158 lb 9.6 oz (71.9 kg)   BMI 22.12 kg/m   BP  116/62 (BP Location: Left Arm, Cuff Size: Normal)   Pulse 83   Ht 5\' 11"  (1.803 m)   Wt 158 lb 9.6 oz (71.9 kg)   SpO2 91%   BMI 22.12 kg/m    Physical Examination:   GENERAL:NAD, no fevers, chills,  no weakness no fatigue thin and frail HEAD: Normocephalic, atraumatic.  EYES: Pupils equal, round, reactive to light. Extraocular muscles intact. No scleral icterus.  MOUTH: Moist mucosal membrane. Dentition intact. No abscess noted.  EAR, NOSE, THROAT: Clear without exudates. No external lesions.  NECK: Supple. No thyromegaly. No nodules. No JVD.  PULMONARY: CTA B/L no wheezing, rhonchi, crackles CARDIOVASCULAR: S1 and S2. Regular rate and rhythm. No murmurs, rubs, or gallops. No edema. Pedal pulses 2+ bilaterally.  GASTROINTESTINAL: Soft, nontender, nondistended. No masses. Positive bowel sounds. No hepatosplenomegaly.  MUSCULOSKELETAL: No swelling, clubbing, or edema. Range of motion full in all extremities.  NEUROLOGIC: Cranial nerves II through XII are intact. No gross focal neurological deficits. Sensation intact. Reflexes intact.  SKIN: No ulceration, lesions, rashes, or cyanosis. Skin warm and dry. Turgor intact.  PSYCHIATRIC: Mood, affect within normal limits. The patient is awake, alert and oriented x 3. Insight, judgment intact.  ALL OTHER ROS ARE NEGATIVE     ASSESSMENT / PLAN: 67 year old pleasant white male thin and frail with abnormal CT chest consistent with respiratory bronchiolitis based on biopsy with severe end-stage COPD Gold stage D associated with chronic systolic congestive heart failure with chronic hypoxic respiratory failure causing severe debilitating respiratory disease and cardiac disease  Severe shortness of breath and dyspnea on exertion Multifactorial related to end-stage COPD, end-stage CHF, deconditioned state  Severe COPD Gold stage D Patient uses and benefits from nebulized therapy Continue neb therapy with Pulmicort and Perforomist We will prescribe mask to assist with delivery of nebulizers Patient cannot take inhalers anymore due to respiratory insufficiency  Chronic hypoxic respiratory failure Continue oxygen as prescribed Patient uses and  benefits from oxygen therapy He needs oxygen to survive  Follow-up cardiology End-stage CHF, h/o CAD  Follow-up oncology History of CML which is indolent   Overall prognosis is poor Follow-up in 6 months  Tony Watson Tony Watson, M.D.  [facility]  Medical Director [place] Director [facility] Cardio-Pulmonary Department

## 2018-06-30 ENCOUNTER — Other Ambulatory Visit: Payer: Self-pay | Admitting: *Deleted

## 2018-06-30 ENCOUNTER — Telehealth: Payer: Self-pay | Admitting: Internal Medicine

## 2018-06-30 NOTE — Telephone Encounter (Signed)
Tony Watson with [facility] calling asking about orders for skilled nursing  It was to add humidification to the O2  Would like an update on this  We are to fax it back to them  Please fax to: [phone]  Please call back

## 2018-06-30 NOTE — Telephone Encounter (Signed)
Returned call to [place] and asked if form can be refaxed and ordering physician must be Dr. Mortimer Fries on form. She agreed and will have provider changed and then refax to our office. Previous form went to [facility] after being given a verbal yes by Christell Faith. Dr. Rockey Situ refused to sign. Nothing further needed.

## 2018-06-30 NOTE — Telephone Encounter (Signed)
Spoke to pt who states he is needing refill of lasix. Original Rx was written in hospital. pls advise

## 2018-07-01 MED ORDER — FUROSEMIDE 80 MG PO TABS: 80.0000 mg | ORAL_TABLET | Freq: Two times a day (BID) | ORAL | 0 refills | Status: DC

## 2018-07-01 NOTE — Telephone Encounter (Signed)
pts wife contacted office stating pt is desperately needing refill before the weekend. Advised pt of refill policy, and that Rx is currently pending.

## 2018-07-01 NOTE — Telephone Encounter (Signed)
Refill resent electronically to [facility] in [place] notified prescription has been sent to  pharmacy.

## 2018-07-01 NOTE — Addendum Note (Signed)
Addended by: Carter Kitten on: 07/01/2018 05:17 PM   Modules accepted: Orders

## 2018-07-01 NOTE — Telephone Encounter (Signed)
Let pt know rx sent in.

## 2018-07-05 ENCOUNTER — Telehealth: Payer: Self-pay

## 2018-07-05 NOTE — Telephone Encounter (Signed)
Verbal order given to [facility] for recertification visits once every other week for 8 weeks for continued COPD and CHF mgt.

## 2018-07-05 NOTE — Telephone Encounter (Signed)
[place] nurse with [facility] left v/m requesting verbal approval for recertification once every other week for 8 weeks for continued COPD and CHF mgt.

## 2018-07-06 ENCOUNTER — Encounter: Payer: Self-pay | Admitting: Family

## 2018-07-06 ENCOUNTER — Ambulatory Visit: Payer: Medicare HMO | Attending: Family | Admitting: Family

## 2018-07-06 VITALS — BP 119/55 | HR 85 | Resp 18 | Ht 71.0 in | Wt 161.5 lb

## 2018-07-06 DIAGNOSIS — Z9889 Other specified postprocedural states: Secondary | ICD-10-CM | POA: Diagnosis not present

## 2018-07-06 DIAGNOSIS — J441 Chronic obstructive pulmonary disease with (acute) exacerbation: Secondary | ICD-10-CM | POA: Insufficient documentation

## 2018-07-06 DIAGNOSIS — M1990 Unspecified osteoarthritis, unspecified site: Secondary | ICD-10-CM | POA: Diagnosis not present

## 2018-07-06 DIAGNOSIS — Z951 Presence of aortocoronary bypass graft: Secondary | ICD-10-CM | POA: Insufficient documentation

## 2018-07-06 DIAGNOSIS — I5022 Chronic systolic (congestive) heart failure: Secondary | ICD-10-CM | POA: Diagnosis present

## 2018-07-06 DIAGNOSIS — E1140 Type 2 diabetes mellitus with diabetic neuropathy, unspecified: Secondary | ICD-10-CM | POA: Diagnosis not present

## 2018-07-06 DIAGNOSIS — I2510 Atherosclerotic heart disease of native coronary artery without angina pectoris: Secondary | ICD-10-CM | POA: Diagnosis not present

## 2018-07-06 DIAGNOSIS — K219 Gastro-esophageal reflux disease without esophagitis: Secondary | ICD-10-CM | POA: Insufficient documentation

## 2018-07-06 DIAGNOSIS — Z8673 Personal history of transient ischemic attack (TIA), and cerebral infarction without residual deficits: Secondary | ICD-10-CM | POA: Insufficient documentation

## 2018-07-06 DIAGNOSIS — E785 Hyperlipidemia, unspecified: Secondary | ICD-10-CM | POA: Diagnosis not present

## 2018-07-06 DIAGNOSIS — Z8249 Family history of ischemic heart disease and other diseases of the circulatory system: Secondary | ICD-10-CM | POA: Insufficient documentation

## 2018-07-06 DIAGNOSIS — Z87891 Personal history of nicotine dependence: Secondary | ICD-10-CM | POA: Diagnosis not present

## 2018-07-06 DIAGNOSIS — Z7982 Long term (current) use of aspirin: Secondary | ICD-10-CM | POA: Insufficient documentation

## 2018-07-06 DIAGNOSIS — J9611 Chronic respiratory failure with hypoxia: Secondary | ICD-10-CM | POA: Diagnosis not present

## 2018-07-06 DIAGNOSIS — Z79899 Other long term (current) drug therapy: Secondary | ICD-10-CM | POA: Diagnosis not present

## 2018-07-06 DIAGNOSIS — I110 Hypertensive heart disease with heart failure: Secondary | ICD-10-CM | POA: Insufficient documentation

## 2018-07-06 DIAGNOSIS — J849 Interstitial pulmonary disease, unspecified: Secondary | ICD-10-CM

## 2018-07-06 DIAGNOSIS — I10 Essential (primary) hypertension: Secondary | ICD-10-CM

## 2018-07-06 NOTE — Progress Notes (Signed)
Patient ID: Tony Watson, male    DOB: [birth year], 67 y.o.   MRN: [id number]  HPI  Tony Watson is a 67 y/o male with a history of CAD, DM, hyperlipidemia, HTN, TIA, GERD, COPD, spinal stenosis, ILD, previous tobacco use and chronic heart failure.   Echo report from 03/03/18 reviewed and showed an EF of 25-30% along with mild Tony.   Right / left cardiac catheterization done 05/03/18 which showed:  1. Three-vessel coronary artery disease with moderate to severe calcification predominantly affecting the ramus intermedius and RCA (80 to 90% stenoses).  There is moderate LAD disease as well as 80% stenosis of small D2 branch. 2. Patent mid RCA stent with mild in-stent restenosis. 3. Mildly to moderately elevated left heart, right heart, and pulmonary artery pressures. 4:   Mildly reduced Fick cardiac output/index. Aggressive medical management recommended  Admitted 05/01/18 due to acute HF exacerbation. Cardiology and palliative care consults were obtained. Initially needed IV lasix and then transitioned to oral diuretics. Nebulizer and solu-medrol given for COPD/ ILD before transitioning to oral prednisone. Sonogram negative for DVT. Discharged after 7 days with home health. Was in the ED 04/24/18 due to COPD exacerbation where he was treated and released. Admitted 03/24/18 due to COPD exacerbation. Received IV steroids and nebulizers and continued on oxygen. IV diuretics given and cardiology consult obtained. Discharged after 4 days.   He presents today for a follow-up visit with a chief complaint of minimal shortness of breath upon moderate exertion. He describes this as chronic in nature having been present for several years. He has associated fatigue, easy bruising and gradual weight gain along with this. He denies any abdominal distention, palpitations, pedal edema, chest pain, wheezing or dizziness. Does feel like his appetite continues to do well.   Past Medical History:  Diagnosis Date  .  Arthritis   . CAD (coronary artery disease)    a. inf MI 11/99 (in [place]) w/ PCI to [facility]; b. [place] 2004 [facility]): EF 50%, patent RCA stent, no obs dz; c. MV 2008: EF 42%, inf infarct, no ischemia; d. R/LHC 9/19: 3-v dz w/ mild dif dz LM, m-dLAD 50, dLAD 60, D2 80, RI 80, mRCA-1 90, mRCA-2 20, patent RCA stent, CO/CI 3.7/2  . Chronic combined systolic (congestive) and diastolic (congestive) heart failure ([place])    a. 7/29019 Echo: EF 25-30%, sev diff HK, mild Tony mod dil LA, mildly reduced RVSF, elevated CVP  . Chronic respiratory failure with hypoxia (HCC)    a. on 2L supplemental oxygen via [place] followed by pulmonology  . CML (chronic myelocytic leukemia) ([place])   . COPD (chronic obstructive pulmonary disease) ([place])   . Diabetic autonomic neuropathy associated with type 2 diabetes mellitus ([place]) 03/27/2015  . Diverticulosis 2002  . GERD (gastroesophageal reflux disease)   . HTN (hypertension)   . Hyperlipidemia   . ILD (interstitial lung disease) ([place])   . Ischemic cardiomyopathy   . Medical non-compliance   . Smoker   . Spinal stenosis   . TIA (transient ischemic attack)    a. s/p PFO closure 2004 (in [place])  . Ulcer    Past Surgical History:  Procedure Laterality Date  . BACK SURGERY     patient denies-just lumbar punctures  . BRONCHOSCOPY    . CARDIAC CATHETERIZATION  11/99   CI per [facility]  . CATARACT EXTRACTION W/PHACO Right 11/24/2016   Procedure: CATARACT EXTRACTION PHACO AND INTRAOCULAR LENS PLACEMENT (IOC);  Surgeon: Birder Robson, MD;  Location: [facility]  ORS;  Service: Ophthalmology;  Laterality: Right;  [place] 1:04.3 AP% 22.3 CDE 14.35 Fluid pack lot # [id number]  . CATARACT EXTRACTION W/PHACO Left 12/29/2016   Procedure: CATARACT EXTRACTION PHACO AND INTRAOCULAR LENS PLACEMENT (IOC);  Surgeon: Birder Robson, MD;  Location: [facility] ORS;  Service: Ophthalmology;  Laterality: Left;  [place] 00:52 AP% 18.5 CDE 9.67 Fluid pack lot # [id number]  . COLONOSCOPY    . COLONOSCOPY WITH PROPOFOL N/A  05/25/2017   Procedure: COLONOSCOPY WITH PROPOFOL;  Surgeon: Jonathon Bellows, MD;  Location: [facility] ENDOSCOPY;  Service: Gastroenterology;  Laterality: N/A;  . CORONARY ANGIOPLASTY     STENT  . CORONARY ARTERY BYPASS GRAFT     stent  . ESOPHAGOGASTRODUODENOSCOPY (EGD) WITH PROPOFOL N/A 05/25/2017   Procedure: ESOPHAGOGASTRODUODENOSCOPY (EGD) WITH PROPOFOL;  Surgeon: Jonathon Bellows, MD;  Location: [facility] ENDOSCOPY;  Service: Gastroenterology;  Laterality: N/A;  . EYE SURGERY    . FLEXIBLE BRONCHOSCOPY N/A 05/19/2017   Procedure: FLEXIBLE BRONCHOSCOPY;  Surgeon: Wilhelmina Mcardle, MD;  Location: [facility] ORS;  Service: Pulmonary;  Laterality: N/A;  . open heart surgery  2004   PFO repair  . RIGHT/LEFT HEART CATH AND CORONARY ANGIOGRAPHY N/A 05/03/2018   Procedure: RIGHT/LEFT HEART CATH AND CORONARY ANGIOGRAPHY;  Surgeon: Nelva Bush, MD;  Location: [facility] CV LAB;  Service: Cardiovascular;  Laterality: N/A;  . UPPER GI ENDOSCOPY     Family History  Problem Relation Age of Onset  . Coronary artery disease Unknown        family hx  . Breast cancer Unknown        1st egree relative <50  . Cancer Mother   . Heart disease Father    Social History   Tobacco Use  . Smoking status: Former Smoker    Packs/day: 0.50    Years: 45.00    Pack years: 22.50    Types: Cigarettes    Last attempt to quit: 01/25/2018    Years since quitting: 0.4  . Smokeless tobacco: Never Used  . Tobacco comment: 1 ppd +40 years  Substance Use Topics  . Alcohol use: Not Currently    Alcohol/week: 0.0 standard drinks    Comment: weekly but last dose 1 month   No Known Allergies  Prior to Admission medications   Medication Sig Start Date End Date Taking? Authorizing Provider  acetaminophen (TYLENOL) 500 MG tablet Take 1,000 mg by mouth daily as needed for moderate pain or headache.   Yes [provider]  albuterol (PROVENTIL) (2.5 MG/3ML) 0.083% nebulizer solution Take 3 mLs (2.5 mg total) by nebulization  every 4 (four) hours as needed for wheezing or shortness of breath. 03/06/18  Yes Vaughan Basta, MD  aspirin 81 MG chewable tablet Chew 1 tablet (81 mg total) by mouth daily. 05/09/18  Yes Loletha Grayer, MD  atorvastatin (LIPITOR) 40 MG tablet Take 1 tablet (40 mg total) by mouth daily at 6 PM. 06/16/18  Yes Gollan, Kathlene November, MD  budesonide (PULMICORT) 0.5 MG/2ML nebulizer solution Take 2 mLs (0.5 mg total) by nebulization 2 (two) times daily. 03/06/18 03/06/19 Yes Vaughan Basta, MD  carvedilol (COREG) 12.5 MG tablet Take 1 tablet (12.5 mg total) by mouth 2 (two) times daily. 06/21/18 09/19/18 Yes Dunn, Areta Haber, PA-C  cetirizine (ZYRTEC) 10 MG chewable tablet Chew 10 mg by mouth daily.   Yes [provider]  Cholecalciferol (VITAMIN D) 2000 units CAPS Take 2,000 Units by mouth every evening.   Yes [provider]  feeding supplement, ENSURE ENLIVE, (ENSURE  ENLIVE) LIQD Take 237 mLs by mouth 2 (two) times daily between meals. 05/08/18  Yes Wieting, Richard, MD  fluconazole (DIFLUCAN) 100 MG tablet Take 1 tablet (100 mg total) by mouth daily. 05/09/18  Yes Wieting, Richard, MD  fluticasone (FLONASE) 50 MCG/ACT nasal spray Place 1 spray into both nostrils daily. 06/28/18  Yes [name], MD  formoterol (PERFOROMIST) 20 MCG/2ML nebulizer solution Take 2 mLs (20 mcg total) by nebulization 2 (two) times daily. J44.9 06/10/18  Yes [name], MD  furosemide (LASIX) 80 MG tablet Take 1 tablet (80 mg total) by mouth 2 (two) times daily. 07/01/18  Yes Bedsole, Amy E, MD  gabapentin (NEURONTIN) 300 MG capsule Take 1 capsule (300 mg total) by mouth at bedtime. 03/23/18  Yes Copland, Frederico Hamman, MD  gabapentin (NEURONTIN) 300 MG capsule TAKE 1 CAPSULE (300 MG TOTAL) BY MOUTH AT BEDTIME. 06/21/18  Yes Bedsole, Amy E, MD  ipratropium-albuterol (DUONEB) 0.5-2.5 (3) MG/3ML SOLN Take 3 mLs by nebulization every 4 (four) hours as needed. DX:J44.9 05/10/18  Yes [name], MD  ivabradine  (CORLANOR) 5 MG TABS tablet Take 1 tablet (5 mg total) by mouth 2 (two) times daily with a meal. 06/24/18  Yes Hackney, Tina A, FNP  LORazepam (ATIVAN) 0.5 MG tablet Take 1 tablet (0.5 mg total) by mouth 3 (three) times daily as needed for anxiety. 05/11/18  Yes Copland, Frederico Hamman, MD  losartan (COZAAR) 25 MG tablet Take 0.5 tablets (12.5 mg total) by mouth daily. 05/20/18  Yes Dunn, Areta Haber, PA-C  megestrol (MEGACE) 40 MG tablet Take 1 tablet (40 mg total) by mouth daily. 04/26/17  Yes Lloyd Huger, MD  Multiple Vitamin (MULTIVITAMIN WITH MINERALS) TABS tablet Take 1 tablet by mouth daily. 03/07/18  Yes Vaughan Basta, MD  nystatin (MYCOSTATIN) 100000 UNIT/ML suspension TAKE AS DIRECTED 5 MLS (500,000 UNITS TOTAL) IN THE MOUTH OR THROAT 4 TIMES DAILY 06/23/18  Yes Bedsole, Amy E, MD  omeprazole (PRILOSEC) 20 MG capsule Take 20 mg by mouth daily.   Yes [provider]  ondansetron (ZOFRAN ODT) 4 MG disintegrating tablet Take 1 tablet (4 mg total) by mouth every 8 (eight) hours as needed for nausea or vomiting. 05/23/18  Yes Copland, Frederico Hamman, MD  oxymetazoline (AFRIN) 0.05 % nasal spray Place 1 spray into both nostrils daily as needed for congestion.   Yes [provider]  predniSONE (DELTASONE) 10 MG tablet Take 1 tablet (10 mg total) by mouth daily with breakfast. 04/29/18  Yes Copland, Frederico Hamman, MD  protein supplement (RESOURCE BENEPROTEIN) POWD Take 12-18 g by mouth 3 (three) times daily with meals. 03/06/18  Yes Vaughan Basta, MD  pseudoephedrine (SUDAFED) 30 MG tablet Take 30 mg by mouth daily as needed for congestion.    Yes [provider]  spironolactone (ALDACTONE) 25 MG tablet Take 1 tablet (25 mg total) by mouth daily. 06/16/18  Yes Gollan, Kathlene November, MD  tiZANidine (ZANAFLEX) 4 MG tablet Take 1 tablet (4 mg total) by mouth Nightly. 03/23/18  Yes Copland, Frederico Hamman, MD  triamcinolone (NASACORT) 55 MCG/ACT AERO nasal inhaler Place 1 spray into the nose 2  (two) times daily. 06/28/18 06/28/19 Yes [name], MD  vitamin C (VITAMIN C) 250 MG tablet Take 1 tablet (250 mg total) by mouth 2 (two) times daily. 03/06/18  Yes Vaughan Basta, MD    Review of Systems  Constitutional: Positive for fatigue. Negative for appetite change.  HENT: Negative for congestion, postnasal drip and sore throat.   Eyes: Negative.  Respiratory: Positive for shortness of breath (with moderate exertion). Negative for chest tightness and wheezing.   Cardiovascular: Negative for chest pain, palpitations and leg swelling.  Gastrointestinal: Negative for abdominal distention and abdominal pain.  Endocrine: Negative.   Genitourinary: Negative.   Musculoskeletal: Negative for back pain and neck pain.  Skin: Negative.   Allergic/Immunologic: Negative.   Neurological: Negative for dizziness and light-headedness.  Hematological: Negative for adenopathy. Bruises/bleeds easily.  Psychiatric/Behavioral: Negative for dysphoric mood and sleep disturbance. The patient is nervous/anxious.    Vitals:   07/06/18 1338  BP: (!) 119/55  Pulse: 85  Resp: 18  SpO2: 92%  Weight: 161 lb 8 oz (73.3 kg)  Height: _0  (1.803 m)   Wt Readings from Last 3 Encounters:  07/06/18 161 lb 8 oz (73.3 kg)  06/28/18 158 lb 9.6 oz (71.9 kg)  06/24/18 157 lb 4 oz (71.3 kg)   Lab Results  Component Value Date   CREATININE 0.62 (L) 06/21/2018   CREATININE 0.61 06/07/2018   CREATININE 0.60 (L) 05/20/2018    Physical Exam  Constitutional: He is oriented to person, place, and time. He appears well-developed and well-nourished.  HENT:  Head: Normocephalic and atraumatic.  Neck: Normal range of motion. Neck supple. No JVD present.  Cardiovascular: Normal rate and regular rhythm.  Pulmonary/Chest: Effort normal. He has no wheezes. He has no rales.  Abdominal: Soft. He exhibits no distension.  Musculoskeletal: He exhibits no edema or tenderness.  Neurological: He is alert and  oriented to person, place, and time.  Skin: Skin is warm and dry.  Psychiatric: He has a normal mood and affect. His behavior is normal. Thought content normal.  Nursing note and vitals reviewed.   Assessment & Plan:  1: Chronic heart failure with reduced ejection fraction- - NYHA class II - euvolemic today - weighing daily and he was reminded to call for an overnight weight gain of >2 pounds or a weekly weight gain of >5 pounds - weight up 4 pounds since last here 2 weeks ago - not adding salt to his food and his wife rinses canned foods and tries to read food labels. Reviewed the importance of closely following a 2052m sodium diet  - saw cardiology (Dunn) 06/21/18 & had carvedilol reduced due to soft BP's - inability to maximize carvedilol use due to BP's - started corlanor BID at last visit due to tachycardia with good reduction in HR - discussed possibly titrating up dosage at his next visit if needed for HR reduction - has received his flu vaccine for this season - BNP 05/01/18 was 1279.0  2: HTN- - BP looks good today - saw PCP (Copland) 05/16/18 - BMP 06/21/18 reviewed and showed sodium 136, potassium 3.8, creatinine 0.62 and GFR 103  3: ILD/ COPD- - saw pulmonologist (Kasa) 06/28/18  Medication list was reviewed.   Sees cardiology in 2 weeks so will see patient back in clinic in 1 month or sooner for any questions/problems before then.

## 2018-07-06 NOTE — Patient Instructions (Signed)
Continue weighing daily and call for an overnight weight gain of > 2 pounds or a weekly weight gain of >5 pounds. 

## 2018-07-10 NOTE — Telephone Encounter (Signed)
Agree with POC

## 2018-07-11 ENCOUNTER — Other Ambulatory Visit: Payer: Self-pay | Admitting: Family

## 2018-07-11 MED ORDER — IVABRADINE HCL 5 MG PO TABS: 5.0000 mg | ORAL_TABLET | Freq: Two times a day (BID) | ORAL | 5 refills | Status: DC

## 2018-07-18 ENCOUNTER — Other Ambulatory Visit: Payer: Self-pay | Admitting: Family Medicine

## 2018-07-18 NOTE — Telephone Encounter (Signed)
Last office visit 05/16/2018 for hospital follow up.  Last refilled 05/11/2018 for #60 with 1 refill.  No future appointments.

## 2018-07-24 NOTE — Progress Notes (Signed)
Cardiology Office Note Date:  07/25/2018  Patient ID:  Tony Watson, Reason Jan 05, 1951, MRN [id number] PCP:  Owens Loffler, MD  Cardiologist:  Dr. Rockey Situ, MD    Chief Complaint: Follow up  History of Present Illness: Tony Watson is a 67 y.o. 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  Patient has several ED visits and hospital admission for COPD exacerbations in July, August, and September, 2019. During his July, 2019 admission for respiratory distress secondary to AECOPD and systolic CHF, he underwent echo that showed an EF of 25-30%, severe diffuse hypokinesis, not technically sufficient to allow for LV diastolic function, mild MR, moderately dilated left atrium, RVSF mildly reduced, PASP unable to be estimated, elevated CVP. He was diuresed and treated for AECOPD with a discharge weight of 62.1 kg. He was readmitted in mid 03/2018 with AECOPD and treated with steroids and nebs. His discharge weight was 64.8 kg. He was seen in the office in late 03/2018 for follow hospital follow up with a weight of 66 kg. Stress testing was advised at that time given his cardiomyopathy, though not completed. His Coreg was increased to 6.25 mg bid. He was continued on Lasix 20 mg daily. He was not on an ACEi/ARB/Entresto/spironolactone secondary to relative hypotension. He was seen in the ED on 04/24/18 with AECOPD and advised to follow up as an outpatient. He was most recently admitted to the hospital on 9/22 with acute on chronic respiratory failure secondary to ILD/AECOPD and HFrEF. He was treated with ABX, steroids, and nebs per IM. He was diuresed successfully with  a discharge weight of 63.6 kg. He underwent R/LHC on 05/03/2018 that showed 3-vessel CAD with mild diffuse disease throughout the left main, mid to distal LAD 50% stenosis, distal LAD 60% stenosis, D2 80% stenosis, ramus 80% stenosis, mid RCA-1 90% stenosis, mid RCA-2 20% stenosis, patent mid RCA stent with mild ISR, post atrio 50%. The coronary arteries were overall moderately to severely calcified. Mildly to moderately elevated left heart, right heart, and pulmonary artery pressures. Mildly reduced cardiac output/indexof 3.7/2 respectively. Aggressive medical therapy was advised with reservation of PCI to the RCA/ramus for refractory symptoms despite optimal medical therapy.  He was seen by the [facility] on 10/4, with a weight of 64.5 kg, BP 125/72, HR 105 bpm. No changes were made. Saw PCP 05/16/2018 with a BP of 110/60, HR 115 bpm, weight 65.3 kg.  In follow-up on 05/20/2018 he was doing well from a cardiac perspective.  He had noted some elevated heart rates in the low 100s bpm associated with Sudafed and nebulizer usage.  He was tolerating medications.  Weights were stable at home.  He was watching his salt and fluid intake.  Weight was noted to be 64 kg.  He was without chest pain.  His Coreg was increased to 25 mg twice daily he was otherwise continued on losartan 12.5 mg, spironolactone 25 mg, and Lasix 80 mg twice daily.  He was advised to discontinue Sudafed.  Labs checked at that time showed a glucose of 225, serum creatinine 0.60, potassium 5.0.  Repeat labs on 10/29 showed a potassium of 4.3, serum creatinine 0.61.  He was last seen on 06/21/2018 and was doing reasonably well.  His appetite was significantly improved.  His weight was up 13 pounds from visit on 10/11 with this felt to be related to increased appetite and weight gain rather than fluid retention.  He was more active at that time.  BP was on the soft side.  Coreg was decreased to 12.5 twice daily.  He had been out of  spironolactone for approximately 1 week prior.  He was seen by the [facility] CHF clinic on 11/15 and started on Corlanor.  At follow-up with [facility] CHF clinic on 11/27 he was doing well.  Weight at that time noted to be 161 pounds.  No changes were made.  He comes in accompanied by his wife today and is doing reasonably well from a cardiac perspective.  Unfortunately multiple contacts in his house have been sick with upper respiratory infections.  The patient now has a sore throat, nasal congestion, sinus pressure, and a cough that is productive of green sputum.  He has been started on azithromycin.  He continues to have a vigorous appetite and grazes throughout the entire day.  He denies any lower extremity swelling, abdominal distention, orthopnea, PND, or early satiety.  His weight trend at home has been trending upwards though no weight gain greater than 3 pounds overnight or greater than 5 pounds in a 1 week time span.  Patient and his wife attribute this weight gain to improved appetite.  His baseline weight is approximately 189 to 198 pounds throughout his entire adult life.  Patient would like to get his weight back to approximately 175 to 180 pounds.  He continues to increase his activity level without issues.  He is compliant with medications and is sticking to a heart healthy diet.  No chest pain, dizziness, presyncope, or syncope.  Outside of his URI, he indicates he is doing very well.  Both the patient and his wife are pleased with his progress.  Past Medical History:  Diagnosis Date  . Arthritis   . CAD (coronary artery disease)    a. inf MI 11/99 (in [place]) w/ PCI to [facility]; b. [place] 2004 [facility]): EF 50%, patent RCA stent, no obs dz; c. [place] 2008: EF 42%, inf infarct, no ischemia; d. R/LHC 9/19: 3-v dz w/ mild dif dz LM, m-dLAD 50, dLAD 60, D2 80, RI 80, mRCA-1 90, mRCA-2 20, patent RCA stent, CO/CI 3.7/2  . Chronic combined systolic (congestive) and diastolic (congestive) heart failure ([place])      a. 7/29019 Echo: EF 25-30%, sev diff HK, mild MR mod dil LA, mildly reduced RVSF, elevated CVP  . Chronic respiratory failure with hypoxia (HCC)    a. on 2L supplemental oxygen via [place] followed by pulmonology  . CML (chronic myelocytic leukemia) ([place])   . COPD (chronic obstructive pulmonary disease) ([place])   . Diabetic autonomic neuropathy associated with type 2 diabetes mellitus ([place]) 03/27/2015  . Diverticulosis 2002  . GERD (gastroesophageal reflux disease)   . HTN (hypertension)   . Hyperlipidemia   . ILD (interstitial lung disease) ([place])   . Ischemic cardiomyopathy   . Medical non-compliance   . Smoker   . Spinal stenosis   . TIA (transient ischemic attack)    a. s/p PFO closure 2004 (in [place])  . Ulcer     Past Surgical History:  Procedure Laterality Date  . BACK SURGERY     patient denies-just lumbar punctures  . BRONCHOSCOPY    . CARDIAC  CATHETERIZATION  11/99   CI per [facility]  . CATARACT EXTRACTION W/PHACO Right 11/24/2016   Procedure: CATARACT EXTRACTION PHACO AND INTRAOCULAR LENS PLACEMENT (IOC);  Surgeon: Birder Robson, MD;  Location: [facility] ORS;  Service: Ophthalmology;  Laterality: Right;  [place] 1:04.3 AP% 22.3 CDE 14.35 Fluid pack lot # [id number]  . CATARACT EXTRACTION W/PHACO Left 12/29/2016   Procedure: CATARACT EXTRACTION PHACO AND INTRAOCULAR LENS PLACEMENT (IOC);  Surgeon: Birder Robson, MD;  Location: [facility] ORS;  Service: Ophthalmology;  Laterality: Left;  [place] 00:52 AP% 18.5 CDE 9.67 Fluid pack lot # [id number]  . COLONOSCOPY    . COLONOSCOPY WITH PROPOFOL N/A 05/25/2017   Procedure: COLONOSCOPY WITH PROPOFOL;  Surgeon: Jonathon Bellows, MD;  Location: [facility] ENDOSCOPY;  Service: Gastroenterology;  Laterality: N/A;  . CORONARY ANGIOPLASTY     STENT  . CORONARY ARTERY BYPASS GRAFT     stent  . ESOPHAGOGASTRODUODENOSCOPY (EGD) WITH PROPOFOL N/A 05/25/2017   Procedure: ESOPHAGOGASTRODUODENOSCOPY (EGD) WITH PROPOFOL;  Surgeon: Jonathon Bellows, MD;  Location: [facility] ENDOSCOPY;   Service: Gastroenterology;  Laterality: N/A;  . EYE SURGERY    . FLEXIBLE BRONCHOSCOPY N/A 05/19/2017   Procedure: FLEXIBLE BRONCHOSCOPY;  Surgeon: Wilhelmina Mcardle, MD;  Location: [facility] ORS;  Service: Pulmonary;  Laterality: N/A;  . open heart surgery  2004   PFO repair  . RIGHT/LEFT HEART CATH AND CORONARY ANGIOGRAPHY N/A 05/03/2018   Procedure: RIGHT/LEFT HEART CATH AND CORONARY ANGIOGRAPHY;  Surgeon: Nelva Bush, MD;  Location: [place] CV LAB;  Service: Cardiovascular;  Laterality: N/A;  . UPPER GI ENDOSCOPY      Current Meds  Medication Sig  . acetaminophen (TYLENOL) 500 MG tablet Take 1,000 mg by mouth daily as needed for moderate pain or headache.  . albuterol (PROVENTIL) (2.5 MG/3ML) 0.083% nebulizer solution Take 3 mLs (2.5 mg total) by nebulization every 4 (four) hours as needed for wheezing or shortness of breath.  Marland Kitchen aspirin 81 MG chewable tablet Chew 1 tablet (81 mg total) by mouth daily.  Marland Kitchen atorvastatin (LIPITOR) 40 MG tablet Take 1 tablet (40 mg total) by mouth daily at 6 PM.  . azithromycin (ZITHROMAX) 500 MG tablet Take 500 mg by mouth once. Take one tablet daily for 3 days.  . budesonide (PULMICORT) 0.5 MG/2ML nebulizer solution Take 2 mLs (0.5 mg total) by nebulization 2 (two) times daily.  . carvedilol (COREG) 12.5 MG tablet Take 1 tablet (12.5 mg total) by mouth 2 (two) times daily.  . cetirizine (ZYRTEC) 10 MG chewable tablet Chew 10 mg by mouth daily.  . Cholecalciferol (VITAMIN D) 2000 units CAPS Take 2,000 Units by mouth every evening.  . feeding supplement, ENSURE ENLIVE, (ENSURE ENLIVE) LIQD Take 237 mLs by mouth 2 (two) times daily between meals.  . fluconazole (DIFLUCAN) 100 MG tablet Take 1 tablet (100 mg total) by mouth daily.  . fluticasone (FLONASE) 50 MCG/ACT nasal spray Place 1 spray into both nostrils daily.  . formoterol (PERFOROMIST) 20 MCG/2ML nebulizer solution Take 2 mLs (20 mcg total) by nebulization 2 (two) times daily. J44.9  . furosemide  (LASIX) 80 MG tablet TAKE 1 TABLET (80 MG TOTAL) BY MOUTH 2 (TWO) TIMES DAILY.  Marland Kitchen gabapentin (NEURONTIN) 300 MG capsule Take 1 capsule (300 mg total) by mouth at bedtime.  Marland Kitchen ipratropium-albuterol (DUONEB) 0.5-2.5 (3) MG/3ML SOLN Take 3 mLs by nebulization every 4 (four) hours as needed. DX:J44.9  . ivabradine (CORLANOR) 5 MG TABS tablet Take 1 tablet (5 mg total) by mouth 2 (two) times daily with a  meal.  . LORazepam (ATIVAN) 0.5 MG tablet TAKE 1 TABLET (0.5 MG TOTAL) BY MOUTH 3 (THREE) TIMES DAILY AS NEEDED FOR ANXIETY.  Marland Kitchen losartan (COZAAR) 25 MG tablet Take 0.5 tablets (12.5 mg total) by mouth daily.  . Multiple Vitamin (MULTIVITAMIN WITH MINERALS) TABS tablet Take 1 tablet by mouth daily.  Marland Kitchen nystatin (MYCOSTATIN) 100000 UNIT/ML suspension TAKE AS DIRECTED 5 MLS (500,000 UNITS TOTAL) IN THE MOUTH OR THROAT 4 TIMES DAILY  . omeprazole (PRILOSEC) 20 MG capsule Take 20 mg by mouth daily.  . ondansetron (ZOFRAN ODT) 4 MG disintegrating tablet Take 1 tablet (4 mg total) by mouth every 8 (eight) hours as needed for nausea or vomiting.  Marland Kitchen oxymetazoline (AFRIN) 0.05 % nasal spray Place 1 spray into both nostrils daily as needed for congestion.  . predniSONE (DELTASONE) 10 MG tablet Take 1 tablet (10 mg total) by mouth daily with breakfast.  . protein supplement (RESOURCE BENEPROTEIN) POWD Take 12-18 g by mouth 3 (three) times daily with meals.  Marland Kitchen spironolactone (ALDACTONE) 25 MG tablet Take 1 tablet (25 mg total) by mouth daily.  Marland Kitchen tiZANidine (ZANAFLEX) 4 MG tablet Take 1 tablet (4 mg total) by mouth Nightly.  . triamcinolone (NASACORT) 55 MCG/ACT AERO nasal inhaler Place 1 spray into the nose 2 (two) times daily.  . vitamin C (VITAMIN C) 250 MG tablet Take 1 tablet (250 mg total) by mouth 2 (two) times daily.    Allergies:   Patient has no known allergies.   Social History:  The patient  reports that he quit smoking about 5 months ago. His smoking use included cigarettes. He has a 22.50 pack-year  smoking history. He has never used smokeless tobacco. He reports previous alcohol use. He reports that he does not use drugs.   Family History:  The patient's family history includes Breast cancer in an other family member; Cancer in his mother; Coronary artery disease in an other family member; Heart disease in his father.  ROS:   Review of Systems  Constitutional: Positive for malaise/fatigue. Negative for chills, diaphoresis, fever and weight loss.  HENT: Positive for congestion, sinus pain and sore throat.   Eyes: Negative for discharge and redness.  Respiratory: Positive for cough, sputum production and shortness of breath. Negative for hemoptysis and wheezing.        Green sputum   Cardiovascular: Negative for chest pain, palpitations, orthopnea, claudication, leg swelling and PND.  Gastrointestinal: Negative for abdominal pain, blood in stool, heartburn, melena, nausea and vomiting.  Genitourinary: Negative for hematuria.  Musculoskeletal: Negative for falls and myalgias.  Skin: Negative for rash.  Neurological: Positive for weakness. Negative for dizziness, tingling, tremors, sensory change, speech change, focal weakness and loss of consciousness.  Endo/Heme/Allergies: Does not bruise/bleed easily.  Psychiatric/Behavioral: Negative for substance abuse. The patient is not nervous/anxious.   All other systems reviewed and are negative.    PHYSICAL EXAM:  VS:  BP (!) 108/50 (BP Location: Left Arm, Patient Position: Sitting, Cuff Size: Normal)   Pulse 76   Ht _0  (1.803 m)   Wt 167 lb 8 oz (76 kg)   BMI 23.36 kg/m  BMI: Body mass index is 23.36 kg/m.  Physical Exam  Constitutional: He is oriented to person, place, and time. He appears well-developed and well-nourished.  HENT:  Head: Normocephalic and atraumatic.  Eyes: Right eye exhibits no discharge. Left eye exhibits no discharge.  Neck: Normal range of motion. No JVD present.  Cardiovascular: Normal rate, regular  rhythm,  S1 normal, S2 normal and normal heart sounds. Exam reveals no distant heart sounds, no friction rub, no midsystolic click and no opening snap.  No murmur heard. Pulses:      Posterior tibial pulses are 2+ on the right side and 2+ on the left side.  Pulmonary/Chest: Effort normal and breath sounds normal. No respiratory distress. He has no decreased breath sounds. He has no wheezes. He has no rales. He exhibits no tenderness.  Coarse breath sounds bilaterally.   Abdominal: Soft. He exhibits no distension. There is no abdominal tenderness.  Musculoskeletal:        General: No edema.  Neurological: He is alert and oriented to person, place, and time.  Skin: Skin is warm and dry. No cyanosis. Nails show no clubbing.  Psychiatric: He has a normal mood and affect. His speech is normal and behavior is normal. Judgment and thought content normal.     EKG:  Was ordered and interpreted by me today. Shows NSR, 76 bpm, RBBB (unchanged from prior)  Recent Labs: 03/03/2018: TSH 1.930 05/01/2018: B Natriuretic Peptide 1,279.0 05/08/2018: Magnesium 2.1 05/16/2018: ALT 79; Hemoglobin 13.5; Platelets 99.0 06/21/2018: BUN 16; Creatinine, Ser 0.62; Potassium 3.8; Sodium 136  No results found for requested labs within last 8760 hours.   CrCl cannot be calculated (Patient's most recent lab result is older than the maximum 21 days allowed.).   Wt Readings from Last 3 Encounters:  07/25/18 167 lb 8 oz (76 kg)  07/06/18 161 lb 8 oz (73.3 kg)  06/28/18 158 lb 9.6 oz (71.9 kg)     Other studies reviewed: Additional studies/records reviewed today include: summarized above  ASSESSMENT AND PLAN:  1. CAD involving the native coronary arteries without angina: He is doing well without any symptoms concerning for angina at this time.  Recent right and left cardiac cath as above.  Continue optimal medical therapy with aspirin, Coreg, and Lipitor.  Aggressive risk factor modification and secondary prevention.   We will continue to reserve intervention of the RCA/ramus for recurrent, refractory angina.  He would prefer to revisit cardiac rehab in the spring.  2. Chronic combined CHF/ICM: His weight is up another 13 pounds today though based on the patient's input, wife's input, and physical exam today he does not appear volume overloaded.  It has been felt this weight gain is secondary to significant weight loss during his hospital admissions with subsequent improvement in his appetite.  His baseline weight throughout his entire adult life was 189 to 198 pounds.  He would like to return to a weight of approximately 175 pounds.  Continue Coreg 12.5 mg twice daily, Lasix 80 mg twice daily, Corlanor 5 mg twice daily, losartan 12.5 mg daily, and spironolactone 25 mg daily.  CHF education provided.  We are unable to transition him from losartan to [facility] at this time given his relative hypotension.  Schedule echocardiogram, limited, given he has been on optimal medical therapy for approximately 3 months.  If his EF remains less than 35% we would need to consider referring the patient to EP for possible evaluation of ICD.  3. Chronic respiratory failure with hypoxia on supplemental oxygen secondary to ILD/COPD: Stable.  Followed by pulmonology.  4. Hypertension: Blood pressure is on the soft side today though he is asymptomatic.  Continue Coreg, Lasix, losartan, and spironolactone as above.  5. Hyperlipidemia: Remains on Lipitor 40 mg daily.  6. URI: Per PCP.  Disposition: F/u with Dr. Rockey Situ or an APP in 3 months.  Current medicines are reviewed at length with the patient today.  The patient did not have any concerns regarding medicines.  Signed, Christell Faith, PA-C 07/25/2018 3:18 PM     [address] [place], [place] [address] [phone]

## 2018-07-25 ENCOUNTER — Encounter: Payer: Self-pay | Admitting: Physician Assistant

## 2018-07-25 ENCOUNTER — Other Ambulatory Visit: Payer: Self-pay | Admitting: Family Medicine

## 2018-07-25 ENCOUNTER — Ambulatory Visit: Payer: Medicare HMO | Admitting: Physician Assistant

## 2018-07-25 VITALS — BP 108/50 | HR 76 | Ht 71.0 in | Wt 167.5 lb

## 2018-07-25 DIAGNOSIS — I5042 Chronic combined systolic (congestive) and diastolic (congestive) heart failure: Secondary | ICD-10-CM

## 2018-07-25 DIAGNOSIS — Z792 Long term (current) use of antibiotics: Secondary | ICD-10-CM

## 2018-07-25 DIAGNOSIS — I10 Essential (primary) hypertension: Secondary | ICD-10-CM

## 2018-07-25 DIAGNOSIS — E785 Hyperlipidemia, unspecified: Secondary | ICD-10-CM

## 2018-07-25 DIAGNOSIS — Z9981 Dependence on supplemental oxygen: Secondary | ICD-10-CM

## 2018-07-25 DIAGNOSIS — I255 Ischemic cardiomyopathy: Secondary | ICD-10-CM

## 2018-07-25 DIAGNOSIS — Z79899 Other long term (current) drug therapy: Secondary | ICD-10-CM

## 2018-07-25 DIAGNOSIS — J441 Chronic obstructive pulmonary disease with (acute) exacerbation: Secondary | ICD-10-CM

## 2018-07-25 DIAGNOSIS — I5043 Acute on chronic combined systolic (congestive) and diastolic (congestive) heart failure: Secondary | ICD-10-CM

## 2018-07-25 DIAGNOSIS — J9621 Acute and chronic respiratory failure with hypoxia: Secondary | ICD-10-CM | POA: Diagnosis not present

## 2018-07-25 DIAGNOSIS — J9611 Chronic respiratory failure with hypoxia: Secondary | ICD-10-CM | POA: Diagnosis not present

## 2018-07-25 DIAGNOSIS — Z7982 Long term (current) use of aspirin: Secondary | ICD-10-CM

## 2018-07-25 DIAGNOSIS — C9310 Chronic myelomonocytic leukemia not having achieved remission: Secondary | ICD-10-CM

## 2018-07-25 DIAGNOSIS — Z7951 Long term (current) use of inhaled steroids: Secondary | ICD-10-CM

## 2018-07-25 DIAGNOSIS — I2510 Atherosclerotic heart disease of native coronary artery without angina pectoris: Secondary | ICD-10-CM

## 2018-07-25 DIAGNOSIS — Z955 Presence of coronary angioplasty implant and graft: Secondary | ICD-10-CM

## 2018-07-25 DIAGNOSIS — H6690 Otitis media, unspecified, unspecified ear: Secondary | ICD-10-CM

## 2018-07-25 DIAGNOSIS — E1140 Type 2 diabetes mellitus with diabetic neuropathy, unspecified: Secondary | ICD-10-CM

## 2018-07-25 DIAGNOSIS — F17210 Nicotine dependence, cigarettes, uncomplicated: Secondary | ICD-10-CM

## 2018-07-25 DIAGNOSIS — T82855D Stenosis of coronary artery stent, subsequent encounter: Secondary | ICD-10-CM

## 2018-07-25 DIAGNOSIS — J849 Interstitial pulmonary disease, unspecified: Secondary | ICD-10-CM

## 2018-07-25 DIAGNOSIS — I110 Hypertensive heart disease with heart failure: Secondary | ICD-10-CM

## 2018-07-25 DIAGNOSIS — Z7952 Long term (current) use of systemic steroids: Secondary | ICD-10-CM

## 2018-07-25 NOTE — Telephone Encounter (Signed)
Last office visit 05/16/2018 for hospital follow up.  Last refilled 07/01/18 for #60 with no refills by Dr. Randa Spike.  No future appointments.  Ok to refill?

## 2018-07-25 NOTE — Patient Instructions (Signed)
Medication Instructions:  No changes  If you need a refill on your cardiac medications before your next appointment, please call your pharmacy.   Lab work: Your provider would like for you to have the following labs today: BMET  If you have labs (blood work) drawn today and your tests are completely normal, you will receive your results only by: Marland Kitchen MyChart Message (if you have MyChart) OR . A paper copy in the mail If you have any lab test that is abnormal or we need to change your treatment, we will call you to review the results.  Testing/Procedures: None ordered  Follow-Up: At [facility], you and your health needs are our priority.  As part of our continuing mission to provide you with exceptional heart care, we have created designated Provider Care Teams.  These Care Teams include your primary Cardiologist (physician) and Advanced Practice Providers (APPs -  Physician Assistants and Nurse Practitioners) who all work together to provide you with the care you need, when you need it. You will need a follow up appointment in 3 months.  You may see Dr. Rockey Situ or one of the following Advanced Practice Providers on your designated Care Team:   Murray Hodgkins, NP Christell Faith, PA-C

## 2018-07-26 ENCOUNTER — Telehealth: Payer: Self-pay

## 2018-07-26 LAB — BASIC METABOLIC PANEL
BUN/Creatinine Ratio: 24 (ref 10–24)
BUN: 22 mg/dL (ref 8–27)
CALCIUM: 10.4 mg/dL — AB (ref 8.6–10.2)
CO2: 26 mmol/L (ref 20–29)
Chloride: 88 mmol/L — ABNORMAL LOW (ref 96–106)
Creatinine, Ser: 0.91 mg/dL (ref 0.76–1.27)
GFR calc Af Amer: 101 mL/min/{1.73_m2} (ref >59)
GFR calc non Af Amer: 88 mL/min/{1.73_m2} (ref >59)
Glucose: 162 mg/dL — ABNORMAL HIGH (ref 65–99)
Potassium: 4.5 mmol/L (ref 3.5–5.2)
Sodium: 133 mmol/L — ABNORMAL LOW (ref 134–144)

## 2018-07-26 NOTE — Telephone Encounter (Signed)
-----   Message from Rise Mu, PA-C sent at 07/26/2018 11:02 AM EST ----- Renal function is starting to trend upwards indicating we are reaching a euvolemic/dry state. Potassium is at goal.  Please decrease Lasix to 80 mg once a day.  Would recommend taking a as needed 80 mg Lasix for worsening shortness of breath, weight gain greater than 3 pounds overnight or greater than 5 pounds in a 1 week time span.  Please also schedule the patient for repeat limited echocardiogram to evaluate for improvement in LV systolic function now that he has been on optimal medications for approximately 3 months.

## 2018-07-27 NOTE — Telephone Encounter (Signed)
lmov to schedule echo  °

## 2018-07-27 NOTE — Telephone Encounter (Signed)
[name] for lab results and suggestions going forward,  Pt agreeable to POC and agrees to reduce 80 mg to once daily.    Pt verbalized understanding. Advised pt to call for any further questions or concerns  Message forwarded to scheduling for echo in 3 months.

## 2018-07-29 ENCOUNTER — Encounter: Payer: Self-pay | Admitting: Family Medicine

## 2018-07-29 ENCOUNTER — Ambulatory Visit (INDEPENDENT_AMBULATORY_CARE_PROVIDER_SITE_OTHER): Payer: Medicare HMO | Admitting: Family Medicine

## 2018-07-29 VITALS — BP 134/62 | HR 82 | Temp 98.4°F | Ht 71.0 in | Wt 169.2 lb

## 2018-07-29 DIAGNOSIS — J849 Interstitial pulmonary disease, unspecified: Secondary | ICD-10-CM

## 2018-07-29 DIAGNOSIS — B370 Candidal stomatitis: Secondary | ICD-10-CM | POA: Insufficient documentation

## 2018-07-29 DIAGNOSIS — J449 Chronic obstructive pulmonary disease, unspecified: Secondary | ICD-10-CM

## 2018-07-29 DIAGNOSIS — J9621 Acute and chronic respiratory failure with hypoxia: Secondary | ICD-10-CM | POA: Diagnosis not present

## 2018-07-29 DIAGNOSIS — J441 Chronic obstructive pulmonary disease with (acute) exacerbation: Secondary | ICD-10-CM | POA: Diagnosis not present

## 2018-07-29 DIAGNOSIS — Z87891 Personal history of nicotine dependence: Secondary | ICD-10-CM

## 2018-07-29 MED ORDER — FLUCONAZOLE 100 MG PO TABS: ORAL_TABLET | ORAL | 0 refills | Status: AC

## 2018-07-29 MED ORDER — FLUCONAZOLE 100 MG PO TABS: ORAL_TABLET | ORAL | 0 refills | Status: DC

## 2018-07-29 MED ORDER — PREDNISONE 20 MG PO TABS: ORAL_TABLET | ORAL | 0 refills | Status: DC

## 2018-07-29 MED ORDER — LEVOFLOXACIN 500 MG PO TABS: 500.0000 mg | ORAL_TABLET | Freq: Every day | ORAL | 0 refills | Status: DC

## 2018-07-29 NOTE — Assessment & Plan Note (Signed)
Meets criteria for COPD exacerbation. In severe oxygen and prednisone dependent COPD history, will treat aggressively with levaquin 500mg  5d course, prednisone taper, discussed continued duoneb use.  Update if not improving with treatment.  He has palliative care provider coming out to house next week to check on him.

## 2018-07-29 NOTE — Progress Notes (Signed)
BP 134/62 (BP Location: Left Arm, Patient Position: Sitting, Cuff Size: Normal)   Pulse 82   Temp 98.4 F (36.9 C) (Oral)   Ht 5\' 11"  (1.803 m)   Wt 169 lb 4 oz (76.8 kg)   SpO2 93% Comment: 3 L, pulsating  BMI 23.61 kg/m    CC: sinus congestion Subjective:    Patient ID: Tony Watson, male    DOB: 20-Feb-1951, 67 y.o.   MRN: [id number]  HPI: Tony Watson is a 67 y.o. male presenting on 07/29/2018 for Sinus Problem (C/o sinus congestion, drainage and cough. Sxs started about 4 days ago. Tried 3 days of old rx of zpack.)   4-5d h/o sinus congestion, chest congestion, ears feel muffled. Increased productive cough with increased sputum production. ST, PNdrainage. + dyspnea and wheezing. Blowing nose with bloody mucous on right.   No fevers/chills, ear or tooth pain.  Palliative care patient. He tried short 3d azithromycin course (he got from palliative care) with minimal improvement.  Normally takes duonebs Q3-4 hours. Also takes pulmicort BID scheduled. Not taking plain albuterol. He has been using nystatin suspension 4 times a day for last several months.   + sick contacts at home.  Ex smoker - quit 01/2018. Wife smokes outdoors.   Known acute on chronic respiratory failure, ILD, chronically on 4L O2. Currently using O2 concentrator which goes up to 3L.   Last hospitalization was 04/2018.   Relevant past medical, surgical, family and social history reviewed and updated as indicated. Interim medical history since our last visit reviewed. Allergies and medications reviewed and updated. Outpatient Medications Prior to Visit  Medication Sig Dispense Refill  . acetaminophen (TYLENOL) 500 MG tablet Take 1,000 mg by mouth daily as needed for moderate pain or headache.    Marland Kitchen aspirin 81 MG chewable tablet Chew 1 tablet (81 mg total) by mouth daily. 30 tablet 0  . atorvastatin (LIPITOR) 40 MG tablet Take 1 tablet (40 mg total) by mouth daily at 6 PM. 90 tablet 0  . azithromycin  (ZITHROMAX) 500 MG tablet Take 500 mg by mouth once. Take one tablet daily for 3 days.    . budesonide (PULMICORT) 0.5 MG/2ML nebulizer solution Take 2 mLs (0.5 mg total) by nebulization 2 (two) times daily. 1440 mL 0  . carvedilol (COREG) 12.5 MG tablet Take 1 tablet (12.5 mg total) by mouth 2 (two) times daily. 180 tablet 3  . cetirizine (ZYRTEC) 10 MG chewable tablet Chew 10 mg by mouth daily.    . Cholecalciferol (VITAMIN D) 2000 units CAPS Take 2,000 Units by mouth every evening.    . feeding supplement, ENSURE ENLIVE, (ENSURE ENLIVE) LIQD Take 237 mLs by mouth 2 (two) times daily between meals. 60 Bottle 0  . fluticasone (FLONASE) 50 MCG/ACT nasal spray Place 1 spray into both nostrils daily. 16 g 2  . formoterol (PERFOROMIST) 20 MCG/2ML nebulizer solution Take 2 mLs (20 mcg total) by nebulization 2 (two) times daily. J44.9 500 mL 12  . furosemide (LASIX) 80 MG tablet TAKE 1 TABLET (80 MG TOTAL) BY MOUTH 2 (TWO) TIMES DAILY. 60 tablet 1  . gabapentin (NEURONTIN) 300 MG capsule Take 1 capsule (300 mg total) by mouth at bedtime. 90 capsule 1  . ipratropium-albuterol (DUONEB) 0.5-2.5 (3) MG/3ML SOLN Take 3 mLs by nebulization every 4 (four) hours as needed. DX:J44.9 630 mL 5  . ivabradine (CORLANOR) 5 MG TABS tablet Take 1 tablet (5 mg total) by mouth 2 (two) times daily with  a meal. 60 tablet 5  . LORazepam (ATIVAN) 0.5 MG tablet TAKE 1 TABLET (0.5 MG TOTAL) BY MOUTH 3 (THREE) TIMES DAILY AS NEEDED FOR ANXIETY. 60 tablet 1  . losartan (COZAAR) 25 MG tablet Take 0.5 tablets (12.5 mg total) by mouth daily. 45 tablet 2  . Multiple Vitamin (MULTIVITAMIN WITH MINERALS) TABS tablet Take 1 tablet by mouth daily. 30 tablet 0  . nystatin (MYCOSTATIN) 100000 UNIT/ML suspension TAKE AS DIRECTED 5 MLS (500,000 UNITS TOTAL) IN THE MOUTH OR THROAT 4 TIMES DAILY 946 mL 0  . omeprazole (PRILOSEC) 20 MG capsule Take 20 mg by mouth daily.    . ondansetron (ZOFRAN ODT) 4 MG disintegrating tablet Take 1 tablet (4  mg total) by mouth every 8 (eight) hours as needed for nausea or vomiting. 30 tablet 3  . oxymetazoline (AFRIN) 0.05 % nasal spray Place 1 spray into both nostrils daily as needed for congestion.    . predniSONE (DELTASONE) 10 MG tablet Take 1 tablet (10 mg total) by mouth daily with breakfast. 30 tablet 3  . protein supplement (RESOURCE BENEPROTEIN) POWD Take 12-18 g by mouth 3 (three) times daily with meals. 227 g 0  . spironolactone (ALDACTONE) 25 MG tablet Take 1 tablet (25 mg total) by mouth daily. 90 tablet 0  . tiZANidine (ZANAFLEX) 4 MG tablet Take 1 tablet (4 mg total) by mouth Nightly. 90 tablet 3  . triamcinolone (NASACORT) 55 MCG/ACT AERO nasal inhaler Place 1 spray into the nose 2 (two) times daily. 1 Inhaler 2  . vitamin C (VITAMIN C) 250 MG tablet Take 1 tablet (250 mg total) by mouth 2 (two) times daily. 30 tablet 0  . albuterol (PROVENTIL) (2.5 MG/3ML) 0.083% nebulizer solution Take 3 mLs (2.5 mg total) by nebulization every 4 (four) hours as needed for wheezing or shortness of breath. 75 mL 0  . fluconazole (DIFLUCAN) 100 MG tablet Take 1 tablet (100 mg total) by mouth daily. 5 tablet 0   No facility-administered medications prior to visit.      Per HPI unless specifically indicated in ROS section below Review of Systems     Objective:    BP 134/62 (BP Location: Left Arm, Patient Position: Sitting, Cuff Size: Normal)   Pulse 82   Temp 98.4 F (36.9 C) (Oral)   Ht 5\' 11"  (1.803 m)   Wt 169 lb 4 oz (76.8 kg)   SpO2 93% Comment: 3 L, pulsating  BMI 23.61 kg/m   Wt Readings from Last 3 Encounters:  07/29/18 169 lb 4 oz (76.8 kg)  07/25/18 167 lb 8 oz (76 kg)  07/06/18 161 lb 8 oz (73.3 kg)    Physical Exam Vitals signs and nursing note reviewed.  Constitutional:      General: He is not in acute distress.    Appearance: He is well-developed.  HENT:     Head: Normocephalic and atraumatic.     Right Ear: Hearing, tympanic membrane, ear canal and external ear  normal.     Left Ear: Hearing, tympanic membrane, ear canal and external ear normal.     Nose: Mucosal edema and congestion present. No rhinorrhea.     Right Sinus: No maxillary sinus tenderness or frontal sinus tenderness.     Left Sinus: No maxillary sinus tenderness or frontal sinus tenderness.     Mouth/Throat:     Pharynx: Uvula midline. Pharyngeal swelling and posterior oropharyngeal erythema (raw tongue, oropharynx) present. No oropharyngeal exudate.     Tonsils: No tonsillar  abscesses.     Comments: White patches posterior oropharynx, B tonsillar pillars, marked erythema of oral mucosal into posterior oropharynx Eyes:     General: No scleral icterus.    Conjunctiva/sclera: Conjunctivae normal.     Pupils: Pupils are equal, round, and reactive to light.  Neck:     Musculoskeletal: Normal range of motion and neck supple.  Cardiovascular:     Rate and Rhythm: Normal rate and regular rhythm.     Heart sounds: Normal heart sounds. No murmur.  Pulmonary:     Effort: Pulmonary effort is normal. No respiratory distress.     Breath sounds: Decreased air movement present. Decreased breath sounds, wheezing (faint expiratory) and rhonchi (diffusely) present. No rales.     Comments: Coarse breath sounds throughout Lymphadenopathy:     Cervical: No cervical adenopathy.  Skin:    General: Skin is warm and dry.     Findings: No rash.    Results for orders placed or performed in visit on 14/48/18  Basic metabolic panel  Result Value Ref Range   Glucose 162 (H) 65 - 99 mg/dL   BUN 22 8 - 27 mg/dL   Creatinine, Ser 0.91 0.76 - 1.27 mg/dL   GFR calc non Af Amer 88 >59 mL/min/1.73   GFR calc Af Amer 101 >59 mL/min/1.73   BUN/Creatinine Ratio 24 10 - 24   Sodium 133 (L) 134 - 144 mmol/L   Potassium 4.5 3.5 - 5.2 mmol/L   Chloride 88 (L) 96 - 106 mmol/L   CO2 26 20 - 29 mmol/L   Calcium 10.4 (H) 8.6 - 10.2 mg/dL      Assessment & Plan:   Problem List Items Addressed This Visit     Severe chronic obstructive pulmonary disease (HCC) (Chronic)   Relevant Medications   predniSONE (DELTASONE) 20 MG tablet   Respiratory failure with hypoxia (HCC) (Chronic)   Oral thrush    Severe, not responding to regular topical nystatin swish/swallow - will Rx diflucan oral course (200mg  on first day, then 100mg  x 1 wk)      Relevant Medications   fluconazole (DIFLUCAN) 100 MG tablet   ILD (interstitial lung disease) ([place])   Ex-smoker    Congratulated on quitting smoking earlier this year.       COPD with acute exacerbation ([place]) - Primary    Meets criteria for COPD exacerbation. In severe oxygen and prednisone dependent COPD history, will treat aggressively with levaquin 500mg  5d course, prednisone taper, discussed continued duoneb use.  Update if not improving with treatment.  He has palliative care provider coming out to house next week to check on him.       Relevant Medications   predniSONE (DELTASONE) 20 MG tablet       Meds ordered this encounter  Medications  . levofloxacin (LEVAQUIN) 500 MG tablet    Sig: Take 1 tablet (500 mg total) by mouth daily.    Dispense:  5 tablet    Refill:  0  . predniSONE (DELTASONE) 20 MG tablet    Sig: Take two tablets daily for 3 days followed by one tablet daily for 4 days    Dispense:  10 tablet    Refill:  0  . DISCONTD: fluconazole (DIFLUCAN) 100 MG tablet    Sig: Take 2 tablets (200 mg total) by mouth daily for 1 day, THEN 1 tablet (100 mg total) daily for 7 days.    Dispense:  9 tablet    Refill:  0  .  fluconazole (DIFLUCAN) 100 MG tablet    Sig: Take 2 tablets (200 mg total) by mouth daily for 1 day, THEN 1 tablet (100 mg total) daily for 7 days.    Dispense:  9 tablet    Refill:  0   No orders of the defined types were placed in this encounter.   Follow up plan: Return if symptoms worsen or fail to improve.  Ria Bush, MD

## 2018-07-29 NOTE — Assessment & Plan Note (Signed)
Severe, not responding to regular topical nystatin swish/swallow - will Rx diflucan oral course (200mg  on first day, then 100mg  x 1 wk)

## 2018-07-29 NOTE — Patient Instructions (Addendum)
I think you have COPD flare with sinusitis. Treat with levaquin antibiotic course sent to pharmacy for next 5 days.  Take prednisone taper sent to pharmacy as well. Then return to 10mg  daily. When finishing medicines, as you still have thrush, treat with oral diflucan sent to pharmacy (2 pills for the first day then 1 pill daily for 1 week).

## 2018-07-29 NOTE — Assessment & Plan Note (Signed)
Congratulated on quitting smoking earlier this year.

## 2018-08-01 NOTE — Telephone Encounter (Signed)
-----   Message from Rise Mu, PA-C sent at 07/26/2018 11:02 AM EST ----- Renal function is starting to trend upwards indicating we are reaching a euvolemic/dry state. Potassium is at goal.  Please decrease Lasix to 80 mg once a day.  Would recommend taking a as needed 80 mg Lasix for worsening shortness of breath, weight gain greater than 3 pounds overnight or greater than 5 pounds in a 1 week time span.  Please also schedule the patient for repeat limited echocardiogram to evaluate for improvement in LV systolic function now that he has been on optimal medications for approximately 3 months.

## 2018-08-01 NOTE — Telephone Encounter (Signed)
Call to pt 12/18 @ 10:54 AM,  "[name] for lab results and suggestions going forward,  Pt agreeable to POC and agrees to reduce 80 mg to once daily.    Pt verbalized understanding. Advised pt to call for any further questions or concerns  Message forwarded to scheduling for echo in 3 months.  "

## 2018-08-08 ENCOUNTER — Encounter: Payer: Self-pay | Admitting: Family

## 2018-08-08 ENCOUNTER — Ambulatory Visit: Payer: Medicare HMO | Attending: Family | Admitting: Family

## 2018-08-08 VITALS — BP 119/72 | HR 104 | Resp 18 | Ht 71.0 in | Wt 170.5 lb

## 2018-08-08 DIAGNOSIS — J9611 Chronic respiratory failure with hypoxia: Secondary | ICD-10-CM | POA: Diagnosis not present

## 2018-08-08 DIAGNOSIS — Z9981 Dependence on supplemental oxygen: Secondary | ICD-10-CM | POA: Insufficient documentation

## 2018-08-08 DIAGNOSIS — I5022 Chronic systolic (congestive) heart failure: Secondary | ICD-10-CM | POA: Insufficient documentation

## 2018-08-08 DIAGNOSIS — J449 Chronic obstructive pulmonary disease, unspecified: Secondary | ICD-10-CM | POA: Diagnosis not present

## 2018-08-08 DIAGNOSIS — R5383 Other fatigue: Secondary | ICD-10-CM | POA: Diagnosis present

## 2018-08-08 DIAGNOSIS — Z951 Presence of aortocoronary bypass graft: Secondary | ICD-10-CM | POA: Insufficient documentation

## 2018-08-08 DIAGNOSIS — I110 Hypertensive heart disease with heart failure: Secondary | ICD-10-CM | POA: Insufficient documentation

## 2018-08-08 DIAGNOSIS — Z9119 Patient's noncompliance with other medical treatment and regimen: Secondary | ICD-10-CM | POA: Insufficient documentation

## 2018-08-08 DIAGNOSIS — Z7982 Long term (current) use of aspirin: Secondary | ICD-10-CM | POA: Insufficient documentation

## 2018-08-08 DIAGNOSIS — Z8249 Family history of ischemic heart disease and other diseases of the circulatory system: Secondary | ICD-10-CM | POA: Diagnosis not present

## 2018-08-08 DIAGNOSIS — Z7951 Long term (current) use of inhaled steroids: Secondary | ICD-10-CM | POA: Diagnosis not present

## 2018-08-08 DIAGNOSIS — I2510 Atherosclerotic heart disease of native coronary artery without angina pectoris: Secondary | ICD-10-CM | POA: Diagnosis not present

## 2018-08-08 DIAGNOSIS — E785 Hyperlipidemia, unspecified: Secondary | ICD-10-CM | POA: Diagnosis not present

## 2018-08-08 DIAGNOSIS — Z87891 Personal history of nicotine dependence: Secondary | ICD-10-CM | POA: Diagnosis not present

## 2018-08-08 DIAGNOSIS — Z8673 Personal history of transient ischemic attack (TIA), and cerebral infarction without residual deficits: Secondary | ICD-10-CM | POA: Diagnosis not present

## 2018-08-08 DIAGNOSIS — Z955 Presence of coronary angioplasty implant and graft: Secondary | ICD-10-CM | POA: Diagnosis not present

## 2018-08-08 DIAGNOSIS — I255 Ischemic cardiomyopathy: Secondary | ICD-10-CM | POA: Diagnosis not present

## 2018-08-08 DIAGNOSIS — Z7952 Long term (current) use of systemic steroids: Secondary | ICD-10-CM | POA: Diagnosis not present

## 2018-08-08 DIAGNOSIS — E1143 Type 2 diabetes mellitus with diabetic autonomic (poly)neuropathy: Secondary | ICD-10-CM | POA: Insufficient documentation

## 2018-08-08 DIAGNOSIS — I10 Essential (primary) hypertension: Secondary | ICD-10-CM

## 2018-08-08 DIAGNOSIS — K219 Gastro-esophageal reflux disease without esophagitis: Secondary | ICD-10-CM | POA: Diagnosis not present

## 2018-08-08 DIAGNOSIS — R000 Tachycardia, unspecified: Secondary | ICD-10-CM | POA: Diagnosis not present

## 2018-08-08 DIAGNOSIS — Z79899 Other long term (current) drug therapy: Secondary | ICD-10-CM | POA: Insufficient documentation

## 2018-08-08 DIAGNOSIS — R002 Palpitations: Secondary | ICD-10-CM | POA: Diagnosis not present

## 2018-08-08 MED ORDER — IVABRADINE HCL 5 MG PO TABS: 5.0000 mg | ORAL_TABLET | Freq: Two times a day (BID) | ORAL | 5 refills | Status: DC

## 2018-08-08 NOTE — Progress Notes (Signed)
Patient ID: Tony Watson, male    DOB: [birth year], 67 y.o.   MRN: [id number]  HPI  Mr Juba is a 67 y/o male with a history of CAD, DM, hyperlipidemia, HTN, TIA, GERD, COPD, spinal stenosis, ILD, previous tobacco use and chronic heart failure.   Echo report from 03/03/18 reviewed and showed an EF of 25-30% along with mild MR.   Right / left cardiac catheterization done 05/03/18 which showed:  1. Three-vessel coronary artery disease with moderate to severe calcification predominantly affecting the ramus intermedius and RCA (80 to 90% stenoses).  There is moderate LAD disease as well as 80% stenosis of small D2 branch. 2. Patent mid RCA stent with mild in-stent restenosis. 3. Mildly to moderately elevated left heart, right heart, and pulmonary artery pressures. 4:   Mildly reduced Fick cardiac output/index. Aggressive medical management recommended  Admitted 05/01/18 due to acute HF exacerbation. Cardiology and palliative care consults were obtained. Initially needed IV lasix and then transitioned to oral diuretics. Nebulizer and solu-medrol given for COPD/ ILD before transitioning to oral prednisone. Sonogram negative for DVT. Discharged after 7 days with home health. Was in the ED 04/24/18 due to COPD exacerbation where he was treated and released. Admitted 03/24/18 due to COPD exacerbation. Received IV steroids and nebulizers and continued on oxygen. IV diuretics given and cardiology consult obtained. Discharged after 4 days.   He presents today for a follow-up visit with a chief complaint of minimal fatigue upon moderate exertion. He describes this as chronic in nature having been present for several years. He has associated cough, shortness of breath, palpitations and slight weight gain along with this. He denies any difficulty sleeping, abdominal distention, pedal edema, chest pain or dizziness. He has been out of corlanor for the last 3 days. Currently it's not being approved by his  insurance. Has noticed an increase in palpitations since he's been off the corlanor.   Past Medical History:  Diagnosis Date  . Arthritis   . CAD (coronary artery disease)    a. inf MI 11/99 (in [place]) w/ PCI to [facility]; b. [place] 2004 [facility]): EF 50%, patent RCA stent, no obs dz; c. MV 2008: EF 42%, inf infarct, no ischemia; d. R/LHC 9/19: 3-v dz w/ mild dif dz LM, m-dLAD 50, dLAD 60, D2 80, RI 80, mRCA-1 90, mRCA-2 20, patent RCA stent, CO/CI 3.7/2  . Chronic combined systolic (congestive) and diastolic (congestive) heart failure ([place])    a. 7/29019 Echo: EF 25-30%, sev diff HK, mild MR mod dil LA, mildly reduced RVSF, elevated CVP  . Chronic respiratory failure with hypoxia (HCC)    a. on 2L supplemental oxygen via [place] followed by pulmonology  . CML (chronic myelocytic leukemia) ([place])   . COPD (chronic obstructive pulmonary disease) ([place])   . Diabetic autonomic neuropathy associated with type 2 diabetes mellitus ([place]) 03/27/2015  . Diverticulosis 2002  . GERD (gastroesophageal reflux disease)   . HTN (hypertension)   . Hyperlipidemia   . ILD (interstitial lung disease) ([place])   . Ischemic cardiomyopathy   . Medical non-compliance   . Smoker   . Spinal stenosis   . TIA (transient ischemic attack)    a. s/p PFO closure 2004 (in [place])  . Ulcer    Past Surgical History:  Procedure Laterality Date  . BACK SURGERY     patient denies-just lumbar punctures  . BRONCHOSCOPY    . CARDIAC CATHETERIZATION  11/99   CI per PMH  . CATARACT EXTRACTION  W/PHACO Right 11/24/2016   Procedure: CATARACT EXTRACTION PHACO AND INTRAOCULAR LENS PLACEMENT (IOC);  Surgeon: Birder Robson, MD;  Location: [facility] ORS;  Service: Ophthalmology;  Laterality: Right;  [place] 1:04.3 AP% 22.3 CDE 14.35 Fluid pack lot # [id number]  . CATARACT EXTRACTION W/PHACO Left 12/29/2016   Procedure: CATARACT EXTRACTION PHACO AND INTRAOCULAR LENS PLACEMENT (IOC);  Surgeon: Birder Robson, MD;  Location: [facility] ORS;  Service: Ophthalmology;   Laterality: Left;  [place] 00:52 AP% 18.5 CDE 9.67 Fluid pack lot # [id number]  . COLONOSCOPY    . COLONOSCOPY WITH PROPOFOL N/A 05/25/2017   Procedure: COLONOSCOPY WITH PROPOFOL;  Surgeon: Jonathon Bellows, MD;  Location: [facility] ENDOSCOPY;  Service: Gastroenterology;  Laterality: N/A;  . CORONARY ANGIOPLASTY     STENT  . CORONARY ARTERY BYPASS GRAFT     stent  . ESOPHAGOGASTRODUODENOSCOPY (EGD) WITH PROPOFOL N/A 05/25/2017   Procedure: ESOPHAGOGASTRODUODENOSCOPY (EGD) WITH PROPOFOL;  Surgeon: Jonathon Bellows, MD;  Location: [facility] ENDOSCOPY;  Service: Gastroenterology;  Laterality: N/A;  . EYE SURGERY    . FLEXIBLE BRONCHOSCOPY N/A 05/19/2017   Procedure: FLEXIBLE BRONCHOSCOPY;  Surgeon: Wilhelmina Mcardle, MD;  Location: [facility] ORS;  Service: Pulmonary;  Laterality: N/A;  . open heart surgery  2004   PFO repair  . RIGHT/LEFT HEART CATH AND CORONARY ANGIOGRAPHY N/A 05/03/2018   Procedure: RIGHT/LEFT HEART CATH AND CORONARY ANGIOGRAPHY;  Surgeon: Nelva Bush, MD;  Location: [facility] CV LAB;  Service: Cardiovascular;  Laterality: N/A;  . UPPER GI ENDOSCOPY     Family History  Problem Relation Age of Onset  . Coronary artery disease Other        family hx  . Breast cancer Other        1st egree relative <50  . Cancer Mother   . Heart disease Father    Social History   Tobacco Use  . Smoking status: Former Smoker    Packs/day: 0.50    Years: 45.00    Pack years: 22.50    Types: Cigarettes    Last attempt to quit: 01/25/2018    Years since quitting: 0.5  . Smokeless tobacco: Never Used  . Tobacco comment: 1 ppd +40 years  Substance Use Topics  . Alcohol use: Not Currently    Alcohol/week: 0.0 standard drinks    Comment: weekly but last dose 1 month   No Known Allergies  Prior to Admission medications   Medication Sig Start Date End Date Taking? Authorizing Provider  acetaminophen (TYLENOL) 500 MG tablet Take 1,000 mg by mouth daily as needed for moderate pain or headache.   Yes  [provider]  albuterol (PROVENTIL) (2.5 MG/3ML) 0.083% nebulizer solution Take 3 mLs (2.5 mg total) by nebulization every 4 (four) hours as needed for wheezing or shortness of breath. 03/06/18  Yes Vaughan Basta, MD  aspirin 81 MG chewable tablet Chew 1 tablet (81 mg total) by mouth daily. 05/09/18  Yes Loletha Grayer, MD  atorvastatin (LIPITOR) 40 MG tablet Take 1 tablet (40 mg total) by mouth daily at 6 PM. 06/16/18  Yes Gollan, Kathlene November, MD  budesonide (PULMICORT) 0.5 MG/2ML nebulizer solution Take 2 mLs (0.5 mg total) by nebulization 2 (two) times daily. 03/06/18 03/06/19 Yes Vaughan Basta, MD  carvedilol (COREG) 12.5 MG tablet Take 1 tablet (12.5 mg total) by mouth 2 (two) times daily. 06/21/18 09/19/18 Yes Dunn, Areta Haber, PA-C  cetirizine (ZYRTEC) 10 MG chewable tablet Chew 10 mg by mouth daily.   Yes [provider]  Cholecalciferol (VITAMIN D)  2000 units CAPS Take 2,000 Units by mouth every evening.   Yes [provider]  feeding supplement, ENSURE ENLIVE, (ENSURE ENLIVE) LIQD Take 237 mLs by mouth 2 (two) times daily between meals. 05/08/18  Yes Wieting, Richard, MD  fluconazole (DIFLUCAN) 100 MG tablet Take 1 tablet (100 mg total) by mouth daily. 05/09/18  Yes Wieting, Richard, MD  fluticasone (FLONASE) 50 MCG/ACT nasal spray Place 1 spray into both nostrils daily. 06/28/18  Yes [name], MD  formoterol (PERFOROMIST) 20 MCG/2ML nebulizer solution Take 2 mLs (20 mcg total) by nebulization 2 (two) times daily. J44.9 06/10/18  Yes [name], MD  furosemide (LASIX) 80 MG tablet Take 1 tablet (80 mg total) by mouth 2 (two) times daily. 07/01/18  Yes Bedsole, Amy E, MD  gabapentin (NEURONTIN) 300 MG capsule Take 1 capsule (300 mg total) by mouth at bedtime. 03/23/18  Yes Copland, Frederico Hamman, MD  gabapentin (NEURONTIN) 300 MG capsule TAKE 1 CAPSULE (300 MG TOTAL) BY MOUTH AT BEDTIME. 06/21/18  Yes Bedsole, Amy E, MD  ipratropium-albuterol (DUONEB) 0.5-2.5  (3) MG/3ML SOLN Take 3 mLs by nebulization every 4 (four) hours as needed. DX:J44.9 05/10/18  Yes [name], MD  ivabradine (CORLANOR) 5 MG TABS tablet Take 1 tablet (5 mg total) by mouth 2 (two) times daily with a meal. 06/24/18  Yes ,  A, FNP  LORazepam (ATIVAN) 0.5 MG tablet Take 1 tablet (0.5 mg total) by mouth 3 (three) times daily as needed for anxiety. 05/11/18  Yes Copland, Frederico Hamman, MD  losartan (COZAAR) 25 MG tablet Take 0.5 tablets (12.5 mg total) by mouth daily. 05/20/18  Yes Dunn, Areta Haber, PA-C  megestrol (MEGACE) 40 MG tablet Take 1 tablet (40 mg total) by mouth daily. 04/26/17  Yes Lloyd Huger, MD  Multiple Vitamin (MULTIVITAMIN WITH MINERALS) TABS tablet Take 1 tablet by mouth daily. 03/07/18  Yes Vaughan Basta, MD  nystatin (MYCOSTATIN) 100000 UNIT/ML suspension TAKE AS DIRECTED 5 MLS (500,000 UNITS TOTAL) IN THE MOUTH OR THROAT 4 TIMES DAILY 06/23/18  Yes Bedsole, Amy E, MD  omeprazole (PRILOSEC) 20 MG capsule Take 20 mg by mouth daily.   Yes [provider]  ondansetron (ZOFRAN ODT) 4 MG disintegrating tablet Take 1 tablet (4 mg total) by mouth every 8 (eight) hours as needed for nausea or vomiting. 05/23/18  Yes Copland, Frederico Hamman, MD  oxymetazoline (AFRIN) 0.05 % nasal spray Place 1 spray into both nostrils daily as needed for congestion.   Yes [provider]  predniSONE (DELTASONE) 10 MG tablet Take 1 tablet (10 mg total) by mouth daily with breakfast. 04/29/18  Yes Copland, Frederico Hamman, MD  protein supplement (RESOURCE BENEPROTEIN) POWD Take 12-18 g by mouth 3 (three) times daily with meals. 03/06/18  Yes Vaughan Basta, MD  pseudoephedrine (SUDAFED) 30 MG tablet Take 30 mg by mouth daily as needed for congestion.    Yes [provider]  spironolactone (ALDACTONE) 25 MG tablet Take 1 tablet (25 mg total) by mouth daily. 06/16/18  Yes Gollan, Kathlene November, MD  tiZANidine (ZANAFLEX) 4 MG tablet Take 1 tablet (4 mg total) by mouth  Nightly. 03/23/18  Yes Copland, Frederico Hamman, MD  triamcinolone (NASACORT) 55 MCG/ACT AERO nasal inhaler Place 1 spray into the nose 2 (two) times daily. 06/28/18 06/28/19 Yes [name], MD  vitamin C (VITAMIN C) 250 MG tablet Take 1 tablet (250 mg total) by mouth 2 (two) times daily. 03/06/18  Yes Vaughan Basta, MD    Review of Systems  Constitutional: Positive  for fatigue. Negative for appetite change.  HENT: Negative for congestion and postnasal drip.   Eyes: Negative.   Respiratory: Positive for cough and shortness of breath. Negative for chest tightness.   Cardiovascular: Positive for palpitations. Negative for chest pain and leg swelling.  Gastrointestinal: Negative for abdominal distention and abdominal pain.  Endocrine: Negative.   Genitourinary: Negative.   Musculoskeletal: Negative for back pain.  Skin: Negative.   Allergic/Immunologic: Negative.   Neurological: Negative for dizziness and light-headedness.  Hematological: Negative for adenopathy. Bruises/bleeds easily.  Psychiatric/Behavioral: Negative for dysphoric mood and sleep disturbance. The patient is nervous/anxious.    Vitals:   08/08/18 1354  BP: 119/72  Pulse: (!) 104  Resp: 18  SpO2: 95%  Weight: 170 lb 8 oz (77.3 kg)  Height: _0  (1.803 m)   Wt Readings from Last 3 Encounters:  08/08/18 170 lb 8 oz (77.3 kg)  07/29/18 169 lb 4 oz (76.8 kg)  07/25/18 167 lb 8 oz (76 kg)   Lab Results  Component Value Date   CREATININE 0.91 07/25/2018   CREATININE 0.62 (L) 06/21/2018   CREATININE 0.61 06/07/2018    Physical Exam Vitals signs and nursing note reviewed.  Constitutional:      Appearance: He is well-developed.  HENT:     Head: Normocephalic and atraumatic.  Neck:     Musculoskeletal: Normal range of motion and neck supple.     Vascular: No JVD.  Cardiovascular:     Rate and Rhythm: Regular rhythm. Tachycardia present.  Pulmonary:     Effort: Pulmonary effort is normal.     Breath sounds:  No wheezing or rales.  Abdominal:     General: There is no distension.     Palpations: Abdomen is soft.  Musculoskeletal:        General: No tenderness.  Skin:    General: Skin is warm and dry.  Neurological:     Mental Status: He is alert and oriented to person, place, and time.  Psychiatric:        Behavior: Behavior normal.        Thought Content: Thought content normal.    Assessment & Plan:  1: Chronic heart failure with reduced ejection fraction- - NYHA class II - euvolemic today - weighing daily and he was reminded to call for an overnight weight gain of >2 pounds or a weekly weight gain of >5 pounds - weight up 4 pounds since last here 2 weeks ago - not adding salt to his food and his wife rinses canned foods and tries to read food labels. Reviewed the importance of closely following a 203m sodium diet  - saw cardiology (Dunn) 07/25/18 - inability to maximize carvedilol or switch losartan to entresto due to BP's - having echo done 10/26/18 - has received his flu vaccine for this season - BNP 05/01/18 was 1279.0  2: HTN- - BP looks good today - saw PCP (Copland) 05/16/18 - BMP 07/25/18 reviewed and showed sodium 133, potassium 4.5, creatinine 0.91 and GFR 88  3: ILD/ COPD- - saw pulmonologist (Kasa) 06/28/18  4: Tachycardia- - has been out of corlanor for the last 3 days and has noticed an increase in palpitations - samples provided today and will start the PA process for the medication - HR had come down nicely in the 80's and is now back into the low 100's  Medication list was reviewed.   Return in 1 month or sooner for any questions/problems before then.

## 2018-08-08 NOTE — Patient Instructions (Signed)
Continue weighing daily and call for an overnight weight gain of > 2 pounds or a weekly weight gain of >5 pounds. 

## 2018-08-10 ENCOUNTER — Encounter: Payer: Self-pay | Admitting: Family

## 2018-08-10 DIAGNOSIS — R000 Tachycardia, unspecified: Secondary | ICD-10-CM | POA: Insufficient documentation

## 2018-08-12 ENCOUNTER — Telehealth: Payer: Self-pay | Admitting: Internal Medicine

## 2018-08-12 DIAGNOSIS — J9611 Chronic respiratory failure with hypoxia: Secondary | ICD-10-CM

## 2018-08-12 NOTE — Telephone Encounter (Signed)
Forwarding to provider for clarification

## 2018-08-12 NOTE — Telephone Encounter (Signed)
[facility] needs an order to allow patient to use 4 L

## 2018-08-12 NOTE — Telephone Encounter (Signed)
Tony Watson from [facility] calling States they will need a prescription for the 4L  to proceed

## 2018-08-12 NOTE — Telephone Encounter (Signed)
Please proceed with 4L [place]

## 2018-08-15 ENCOUNTER — Other Ambulatory Visit: Payer: Self-pay | Admitting: *Deleted

## 2018-08-15 MED ORDER — NYSTATIN 100000 UNIT/ML MT SUSP: 5.0000 mL | Freq: Four times a day (QID) | OROMUCOSAL | 1 refills | Status: DC

## 2018-08-15 NOTE — Telephone Encounter (Signed)
Last Rx 06/23/2018 975ml. Last OV 07/29/2018

## 2018-08-15 NOTE — Telephone Encounter (Signed)
New RX sent to [facility].

## 2018-08-17 ENCOUNTER — Telehealth: Payer: Self-pay | Admitting: Oncology

## 2018-08-17 NOTE — Telephone Encounter (Signed)
Call dropped. BF

## 2018-08-20 ENCOUNTER — Other Ambulatory Visit: Payer: Self-pay | Admitting: Family Medicine

## 2018-08-28 NOTE — Progress Notes (Signed)
[place]  [phone] [phone]) [phone]  ID: Tony Watson OB: 17-Oct-1950  MR#: [id number]  WUX#:[id number]  Patient Care Team: Owens Loffler, MD as PCP - General Wellington Hampshire, MD as PCP - Cardiology (Cardiology) Leonel Ramsay, MD (Infectious Diseases) [name], MD as Consulting Physician (Pulmonary Disease) Wellington Hampshire, MD as Consulting Physician (Cardiology)  CHIEF COMPLAINT: Bone marrow biopsy proven CMML-1.  INTERVAL HISTORY: Patient returns to clinic today for repeat laboratory work and routine 89-monthfollow-up.  He continues to have chronic shortness of breath and now requires 4 L of oxygen 24 hours/day.  He no longer is having weight loss.  He has chronic weakness and fatigue. He denies any fevers or night sweats. He has no neurologic complaints.  He denies any chest pain, cough, or hemoptysis.  He denies any nausea, vomiting, constipation, or diarrhea.  He has no melena or hematochezia.  He has no urinary complaints.  Patient offers no further specific complaints today.  REVIEW OF SYSTEMS:   Review of Systems  Constitutional: Positive for malaise/fatigue. Negative for diaphoresis, fever and weight loss.  HENT: Negative.  Negative for sore throat.   Respiratory: Positive for shortness of breath. Negative for cough.   Cardiovascular: Negative.  Negative for chest pain and leg swelling.  Gastrointestinal: Negative.  Negative for abdominal pain, nausea and vomiting.  Genitourinary: Negative.  Negative for dysuria.  Musculoskeletal: Negative.  Negative for back pain.  Neurological: Positive for weakness. Negative for sensory change and focal weakness.  Endo/Heme/Allergies: Does not bruise/bleed easily.  Psychiatric/Behavioral: Negative.  The patient is not nervous/anxious.     As per HPI. Otherwise, a complete review of systems is negative.  PAST MEDICAL HISTORY: Past Medical History:  Diagnosis Date  . Arthritis     . CAD (coronary artery disease)    a. [place] [facility] [facility] [id number]: EF 50%, patent RCA stent, no obs dz; c. MV 2008: EF 42%, inf infarct, no ischemia; d. R/LHC 9/19: 3-v dz w/ mild dif dz LM, m-dLAD 50, dLAD 60, D2 80, RI 80, mRCA-1 90, mRCA-2 20, patent RCA stent, CO/CI 3.7/2  . Chronic combined systolic (congestive) and diastolic (congestive) heart failure ([facility]    a. 7/29019 Echo: EF 25-30%, sev diff HK, mild MR mod dil LA, mildly reduced RVSF, elevated CVP  . Chronic respiratory failure with hypoxia (HCC)    a. on 2L supplemental oxygen via [place] followed by pulmonology  . CML (chronic myelocytic leukemia) ([place]   . COPD (chronic obstructive pulmonary disease) ([facility]   . Diabetic autonomic neuropathy associated with type 2 diabetes mellitus ([facility] 03/27/2015  . Diverticulosis 2002  . GERD (gastroesophageal reflux disease)   . HTN (hypertension)   . Hyperlipidemia   . ILD (interstitial lung disease) ([place]   . Ischemic cardiomyopathy   . Medical non-compliance   . Smoker   . Spinal stenosis   . TIA (transient ischemic attack)    a. s/p PFO closure 2004 ([place]  . Ulcer     PAST SURGICAL HISTORY: Past Surgical History:  Procedure Laterality Date  . BACK SURGERY     patient denies-just lumbar punctures  . BRONCHOSCOPY    . CARDIAC CATHETERIZATION  11/99   CI per [facility]  . CATARACT EXTRACTION W/PHACO Right 11/24/2016   Procedure: CATARACT EXTRACTION PHACO AND INTRAOCULAR LENS PLACEMENT (IOC);  Surgeon: WBirder Robson MD;  Location: [facility] ORS;  Service: Ophthalmology;  Laterality: Right;  [place] 1:04.3 AP% 22.3 CDE 14.35 Fluid pack lot # [id number]  . CATARACT EXTRACTION W/PHACO Left 12/29/2016   Procedure: CATARACT EXTRACTION PHACO AND INTRAOCULAR LENS PLACEMENT (IOC);  Surgeon: Birder Robson, MD;  Location: [facility] ORS;  Service: Ophthalmology;  Laterality: Left;  [place] 00:52 AP% 18.5 CDE 9.67 Fluid pack lot # [id number]  . COLONOSCOPY    . COLONOSCOPY WITH  PROPOFOL N/A 05/25/2017   Procedure: COLONOSCOPY WITH PROPOFOL;  Surgeon: Jonathon Bellows, MD;  Location[facility] ENDOSCOPY;  Service: Gastroenterology;  Laterality: N/A;  . CORONARY ANGIOPLASTY     STENT  . CORONARY ARTERY BYPASS GRAFT     stent  . ESOPHAGOGASTRODUODENOSCOPY (EGD) WITH PROPOFOL N/A 05/25/2017   Procedure: ESOPHAGOGASTRODUODENOSCOPY (EGD) WITH PROPOFOL;  Surgeon: Jonathon Bellows, MD;  Location: [facility] ENDOSCOPY;  Service: Gastroenterology;  Laterality: N/A;  . EYE SURGERY    . FLEXIBLE BRONCHOSCOPY N/A 05/19/2017   Procedure: FLEXIBLE BRONCHOSCOPY;  Surgeon: Wilhelmina Mcardle, MD;  Location: [facility] ORS;  Service: Pulmonary;  Laterality: N/A;  . open heart surgery  2004   PFO repair  . RIGHT/LEFT HEART CATH AND CORONARY ANGIOGRAPHY N/A 05/03/2018   Procedure: RIGHT/LEFT HEART CATH AND CORONARY ANGIOGRAPHY;  Surgeon: Nelva Bush, MD;  Location: [place] CV LAB;  Service: Cardiovascular;  Laterality: N/A;  . UPPER GI ENDOSCOPY      FAMILY HISTORY Family History  Problem Relation Age of Onset  . Coronary artery disease Other        family hx  . Breast cancer Other        1st egree relative <50  . Cancer Mother   . Heart disease Father        ADVANCED DIRECTIVES:    HEALTH MAINTENANCE: Social History   Tobacco Use  . Smoking status: Former Smoker    Packs/day: 0.50    Years: 45.00    Pack years: 22.50    Types: Cigarettes    Last attempt to quit: 01/25/2018    Years since quitting: 0.5  . Smokeless tobacco: Never Used  . Tobacco comment: 1 ppd +40 years  Substance Use Topics  . Alcohol use: Not Currently    Alcohol/week: 0.0 standard drinks    Comment: weekly but last dose 1 month  . Drug use: No     Colonoscopy:  PAP:  Bone density:  Lipid panel:  No Known Allergies  Current Outpatient Medications  Medication Sig Dispense Refill  . acetaminophen (TYLENOL) 500 MG tablet Take 1,000 mg by mouth daily as needed for moderate pain or headache.    Marland Kitchen  aspirin 81 MG chewable tablet Chew 1 tablet (81 mg total) by mouth daily. 30 tablet 0  . atorvastatin (LIPITOR) 40 MG tablet Take 1 tablet (40 mg total) by mouth daily at 6 PM. 90 tablet 0  . budesonide (PULMICORT) 0.5 MG/2ML nebulizer solution Take 2 mLs (0.5 mg total) by nebulization 2 (two) times daily. 1440 mL 0  . carvedilol (COREG) 12.5 MG tablet Take 1 tablet (12.5 mg total) by mouth 2 (two) times daily. 180 tablet 3  . cetirizine (ZYRTEC) 10 MG chewable tablet Chew 10 mg by mouth daily.    . Cholecalciferol (VITAMIN D) 2000 units CAPS Take 2,000 Units by mouth every evening.    . feeding supplement, ENSURE ENLIVE, (ENSURE ENLIVE) LIQD Take 237 mLs by mouth 2 (two) times daily between meals. 60 Bottle 0  . fluticasone (FLONASE) 50 MCG/ACT nasal spray Place 1 spray into both nostrils daily. 16 g 2  .  formoterol (PERFOROMIST) 20 MCG/2ML nebulizer solution Take 2 mLs (20 mcg total) by nebulization 2 (two) times daily. J44.9 500 mL 12  . furosemide (LASIX) 80 MG tablet TAKE 1 TABLET (80 MG TOTAL) BY MOUTH 2 (TWO) TIMES DAILY. 60 tablet 1  . gabapentin (NEURONTIN) 300 MG capsule Take 1 capsule (300 mg total) by mouth at bedtime. 90 capsule 1  . ipratropium-albuterol (DUONEB) 0.5-2.5 (3) MG/3ML SOLN Take 3 mLs by nebulization every 4 (four) hours as needed. DX:J44.9 630 mL 5  . ivabradine (CORLANOR) 5 MG TABS tablet Take 1 tablet (5 mg total) by mouth 2 (two) times daily with a meal. 60 tablet 5  . LORazepam (ATIVAN) 0.5 MG tablet TAKE 1 TABLET (0.5 MG TOTAL) BY MOUTH 3 (THREE) TIMES DAILY AS NEEDED FOR ANXIETY. 60 tablet 1  . losartan (COZAAR) 25 MG tablet Take 0.5 tablets (12.5 mg total) by mouth daily. 45 tablet 2  . Multiple Vitamin (MULTIVITAMIN WITH MINERALS) TABS tablet Take 1 tablet by mouth daily. 30 tablet 0  . nystatin (MYCOSTATIN) 100000 UNIT/ML suspension Use as directed 5 mLs (500,000 Units total) in the mouth or throat 4 (four) times daily. 946 mL 1  . omeprazole (PRILOSEC) 20 MG  capsule Take 20 mg by mouth daily.    . ondansetron (ZOFRAN ODT) 4 MG disintegrating tablet Take 1 tablet (4 mg total) by mouth every 8 (eight) hours as needed for nausea or vomiting. 30 tablet 3  . oxymetazoline (AFRIN) 0.05 % nasal spray Place 1 spray into both nostrils daily as needed for congestion.    . predniSONE (DELTASONE) 10 MG tablet Take 1 tablet (10 mg total) by mouth daily with breakfast. 30 tablet 3  . predniSONE (DELTASONE) 20 MG tablet Take two tablets daily for 3 days followed by one tablet daily for 4 days 10 tablet 0  . protein supplement (RESOURCE BENEPROTEIN) POWD Take 12-18 g by mouth 3 (three) times daily with meals. 227 g 0  . spironolactone (ALDACTONE) 25 MG tablet Take 1 tablet (25 mg total) by mouth daily. 90 tablet 0  . tiZANidine (ZANAFLEX) 4 MG tablet Take 1 tablet (4 mg total) by mouth Nightly. 90 tablet 3  . triamcinolone (NASACORT) 55 MCG/ACT AERO nasal inhaler Place 1 spray into the nose 2 (two) times daily. 1 Inhaler 2  . vitamin C (VITAMIN C) 250 MG tablet Take 1 tablet (250 mg total) by mouth 2 (two) times daily. 30 tablet 0   No current facility-administered medications for this visit.     OBJECTIVE: Vitals:   08/29/18 1033 08/29/18 1038  BP: 135/71   Pulse: (!) 111   Temp: 97.8 F (36.6 C)   SpO2:  92%     Body mass index is 24.78 kg/m.    ECOG FS:0 - Asymptomatic  General: Well-developed, well-nourished, no acute distress. Eyes: Pink conjunctiva, anicteric sclera. HEENT: Normocephalic, moist mucous membranes. Lungs: Clear to auscultation bilaterally. Heart: Regular rate and rhythm. No rubs, murmurs, or gallops. Abdomen: Soft, nontender, nondistended. No organomegaly noted, normoactive bowel sounds. Musculoskeletal: No edema, cyanosis, or clubbing. Neuro: Alert, answering all questions appropriately. Cranial nerves grossly intact. Skin: No rashes or petechiae noted. Psych: Normal affect.  LAB RESULTS:  Lab Results  Component Value Date    NA 133 (L) 07/25/2018   K 4.5 07/25/2018   CL 88 (L) 07/25/2018   CO2 26 07/25/2018   GLUCOSE 162 (H) 07/25/2018   BUN 22 07/25/2018   CREATININE 0.91 07/25/2018   CALCIUM 10.4 (H)  07/25/2018   PROT 7.7 05/16/2018   ALBUMIN 4.1 05/16/2018   AST 40 (H) 05/16/2018   ALT 79 (H) 05/16/2018   ALKPHOS 80 05/16/2018   BILITOT 0.4 05/16/2018   GFRNONAA 88 07/25/2018   GFRAA 101 07/25/2018    Lab Results  Component Value Date   WBC 10.5 08/29/2018   NEUTROABS 6.1 08/29/2018   HGB 11.3 (L) 08/29/2018   HCT 34.0 (L) 08/29/2018   MCV 100.3 (H) 08/29/2018   PLT 164 08/29/2018     STUDIES: No results found.  ASSESSMENT: Bone marrow biopsy proven CMML-1.  PLAN:    1. CMML: Bone marrow biopsy completed on August 30, 2015 confirmed the diagnosis. Repeat bone marrow biopsy on March 08, 2017 was essentially unchanged. Patient's absolute monocytosis has been greater than 1.0 and persistent since at least July 2014.  Today's result is 2.4.  Previously, the remainder of his blood work including BCR-ABL and ANA were either negative or within normal limits. He possibly will need treatment with azacitidine or decitabine in the future, but not at this time.  He continues to have no evidence of end organ involvement. CT scans from February 24, 2017 revealed stable lung nodules.  Return to clinic in 3 months with repeat laboratory work and further evaluation. 2. Pulmonary nodules: Most recent CT scan revealed unchanged.  Continue follow-up with pulmonology as indicated. 3. Weight loss: Resolved.  Patient is now gaining weight. 4.  Shortness of breath: Continue 4 L of oxygen 24 hours/day.  Continue follow-up and treatment per pulmonary.   Patient expressed understanding and was in agreement with this plan. He also understands that He can call clinic at any time with any questions, concerns, or complaints.    Lloyd Huger, MD   08/30/2018 6:30 AM

## 2018-08-29 ENCOUNTER — Other Ambulatory Visit: Payer: Self-pay

## 2018-08-29 ENCOUNTER — Inpatient Hospital Stay: Payer: Medicare HMO

## 2018-08-29 ENCOUNTER — Inpatient Hospital Stay: Payer: Medicare HMO | Attending: Oncology | Admitting: Oncology

## 2018-08-29 VITALS — BP 135/71 | HR 111 | Temp 97.8°F | Ht 71.0 in | Wt 177.7 lb

## 2018-08-29 DIAGNOSIS — C9310 Chronic myelomonocytic leukemia not having achieved remission: Secondary | ICD-10-CM | POA: Insufficient documentation

## 2018-08-29 DIAGNOSIS — Z7982 Long term (current) use of aspirin: Secondary | ICD-10-CM | POA: Insufficient documentation

## 2018-08-29 DIAGNOSIS — Z79899 Other long term (current) drug therapy: Secondary | ICD-10-CM | POA: Insufficient documentation

## 2018-08-29 DIAGNOSIS — I10 Essential (primary) hypertension: Secondary | ICD-10-CM | POA: Diagnosis not present

## 2018-08-29 DIAGNOSIS — E119 Type 2 diabetes mellitus without complications: Secondary | ICD-10-CM | POA: Diagnosis not present

## 2018-08-29 DIAGNOSIS — Z87891 Personal history of nicotine dependence: Secondary | ICD-10-CM | POA: Insufficient documentation

## 2018-08-29 LAB — CBC WITH DIFFERENTIAL/PLATELET
Abs Immature Granulocytes: 0.52 10*3/uL — ABNORMAL HIGH (ref 0.00–0.07)
Basophils Absolute: 0.1 10*3/uL (ref 0.0–0.1)
Basophils Relative: 1 %
Eosinophils Absolute: 0 10*3/uL (ref 0.0–0.5)
Eosinophils Relative: 0 %
HCT: 34 % — ABNORMAL LOW (ref 39.0–52.0)
Hemoglobin: 11.3 g/dL — ABNORMAL LOW (ref 13.0–17.0)
Immature Granulocytes: 5 %
Lymphocytes Relative: 14 %
Lymphs Abs: 1.4 10*3/uL (ref 0.7–4.0)
MCH: 33.3 pg (ref 26.0–34.0)
MCHC: 33.2 g/dL (ref 30.0–36.0)
MCV: 100.3 fL — ABNORMAL HIGH (ref 80.0–100.0)
Monocytes Absolute: 2.4 10*3/uL — ABNORMAL HIGH (ref 0.1–1.0)
Monocytes Relative: 23 %
Neutro Abs: 6.1 10*3/uL (ref 1.7–7.7)
Neutrophils Relative %: 57 %
Platelets: 164 10*3/uL (ref 150–400)
RBC: 3.39 MIL/uL — ABNORMAL LOW (ref 4.22–5.81)
RDW: 14.9 % (ref 11.5–15.5)
WBC: 10.5 10*3/uL (ref 4.0–10.5)
nRBC: 0 % (ref 0.0–0.2)

## 2018-08-29 NOTE — Progress Notes (Signed)
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 Patient stated that he had been doing ok. Patient stated that he gets nauseated once in a while but his medication helps him. Patient stated that his appetite is doing better and had gained some weight. Patient stated that he gets SOB but currently uses 3 liters of oxygen all the time to help with his breathing.

## 2018-09-04 NOTE — Progress Notes (Signed)
Patient ID: Tony Watson, male    DOB: 1950-12-18, 68 y.o.   MRN: [id number]  HPI  Mr Godshall is a 68 y/o male with a history of CAD, DM, hyperlipidemia, HTN, TIA, GERD, COPD, spinal stenosis, ILD, previous tobacco use and chronic heart failure.   Echo report from 03/03/18 reviewed and showed an EF of 25-30% along with mild MR.   Right / left cardiac catheterization done 05/03/18 which showed:  1. Three-vessel coronary artery disease with moderate to severe calcification predominantly affecting the ramus intermedius and RCA (80 to 90% stenoses).  There is moderate LAD disease as well as 80% stenosis of small D2 branch. 2. Patent mid RCA stent with mild in-stent restenosis. 3. Mildly to moderately elevated left heart, right heart, and pulmonary artery pressures. 4:   Mildly reduced Fick cardiac output/index. Aggressive medical management recommended  Admitted 05/01/18 due to acute HF exacerbation. Cardiology and palliative care consults were obtained. Initially needed IV lasix and then transitioned to oral diuretics. Nebulizer and solu-medrol given for COPD/ ILD before transitioning to oral prednisone. Sonogram negative for DVT. Discharged after 7 days with home health. Was in the ED 04/24/18 due to COPD exacerbation where he was treated and released. Admitted 03/24/18 due to COPD exacerbation. Received IV steroids and nebulizers and continued on oxygen. IV diuretics given and cardiology consult obtained. Discharged after 4 days.   He presents today for a follow-up visit with a chief complaint of minimal shortness of breath upon moderate exertion. He describes this as chronic in nature having been present for several years. He has associated fatigue, cough, palpitations and gradual weight gain due to an improvement in his appetite. He denies any difficulty sleeping, dizziness, abdominal distention, pedal edema or chest pain. Was unable to afford the ivabradine and he didn't qualify for their patient  assistance program. HR has risen since he's been off the ivabradine. Wears oxygen at 4L at home but 3L when he's out.   Past Medical History:  Diagnosis Date  . Arthritis   . CAD (coronary artery disease)    a. inf MI 11/99 (in [place]) w/ PCI to [facility]; b. [place] 2004 [facility]): EF 50%, patent RCA stent, no obs dz; c. MV 2008: EF 42%, inf infarct, no ischemia; d. R/LHC 9/19: 3-v dz w/ mild dif dz LM, m-dLAD 50, dLAD 60, D2 80, RI 80, mRCA-1 90, mRCA-2 20, patent RCA stent, CO/CI 3.7/2  . Chronic combined systolic (congestive) and diastolic (congestive) heart failure ([place])    a. 7/29019 Echo: EF 25-30%, sev diff HK, mild MR mod dil LA, mildly reduced RVSF, elevated CVP  . Chronic respiratory failure with hypoxia (HCC)    a. on 2L supplemental oxygen via [place] followed by pulmonology  . CML (chronic myelocytic leukemia) ([place])   . COPD (chronic obstructive pulmonary disease) ([place])   . Diabetic autonomic neuropathy associated with type 2 diabetes mellitus ([place]) 03/27/2015  . Diverticulosis 2002  . GERD (gastroesophageal reflux disease)   . HTN (hypertension)   . Hyperlipidemia   . ILD (interstitial lung disease) ([place])   . Ischemic cardiomyopathy   . Medical non-compliance   . Smoker   . Spinal stenosis   . TIA (transient ischemic attack)    a. s/p PFO closure 2004 (in [place])  . Ulcer    Past Surgical History:  Procedure Laterality Date  . BACK SURGERY     patient denies-just lumbar punctures  . BRONCHOSCOPY    . CARDIAC CATHETERIZATION  11/99  CI per PMH  . CATARACT EXTRACTION W/PHACO Right 11/24/2016   Procedure: CATARACT EXTRACTION PHACO AND INTRAOCULAR LENS PLACEMENT (IOC);  Surgeon: Birder Robson, MD;  Location: [facility] ORS;  Service: Ophthalmology;  Laterality: Right;  [place] 1:04.3 AP% 22.3 CDE 14.35 Fluid pack lot # [id number]  . CATARACT EXTRACTION W/PHACO Left 12/29/2016   Procedure: CATARACT EXTRACTION PHACO AND INTRAOCULAR LENS PLACEMENT (IOC);  Surgeon: Birder Robson, MD;  Location:  [facility] ORS;  Service: Ophthalmology;  Laterality: Left;  [place] 00:52 AP% 18.5 CDE 9.67 Fluid pack lot # [id number]  . COLONOSCOPY    . COLONOSCOPY WITH PROPOFOL N/A 05/25/2017   Procedure: COLONOSCOPY WITH PROPOFOL;  Surgeon: Jonathon Bellows, MD;  Location: [facility] ENDOSCOPY;  Service: Gastroenterology;  Laterality: N/A;  . CORONARY ANGIOPLASTY     STENT  . CORONARY ARTERY BYPASS GRAFT     stent  . ESOPHAGOGASTRODUODENOSCOPY (EGD) WITH PROPOFOL N/A 05/25/2017   Procedure: ESOPHAGOGASTRODUODENOSCOPY (EGD) WITH PROPOFOL;  Surgeon: Jonathon Bellows, MD;  Location: [facility] ENDOSCOPY;  Service: Gastroenterology;  Laterality: N/A;  . EYE SURGERY    . FLEXIBLE BRONCHOSCOPY N/A 05/19/2017   Procedure: FLEXIBLE BRONCHOSCOPY;  Surgeon: Wilhelmina Mcardle, MD;  Location: [facility] ORS;  Service: Pulmonary;  Laterality: N/A;  . open heart surgery  2004   PFO repair  . RIGHT/LEFT HEART CATH AND CORONARY ANGIOGRAPHY N/A 05/03/2018   Procedure: RIGHT/LEFT HEART CATH AND CORONARY ANGIOGRAPHY;  Surgeon: Nelva Bush, MD;  Location: [place] CV LAB;  Service: Cardiovascular;  Laterality: N/A;  . UPPER GI ENDOSCOPY     Family History  Problem Relation Age of Onset  . Coronary artery disease Other        family hx  . Breast cancer Other        1st egree relative <50  . Cancer Mother   . Heart disease Father    Social History   Tobacco Use  . Smoking status: Former Smoker    Packs/day: 0.50    Years: 45.00    Pack years: 22.50    Types: Cigarettes    Last attempt to quit: 01/25/2018    Years since quitting: 0.6  . Smokeless tobacco: Never Used  . Tobacco comment: 1 ppd +40 years  Substance Use Topics  . Alcohol use: Not Currently    Alcohol/week: 0.0 standard drinks    Comment: weekly but last dose 1 month   No Known Allergies  Prior to Admission medications   Medication Sig Start Date End Date Taking? Authorizing Provider  acetaminophen (TYLENOL) 500 MG tablet Take 1,000 mg by mouth daily as needed for  moderate pain or headache.   Yes [provider]  aspirin 81 MG chewable tablet Chew 1 tablet (81 mg total) by mouth daily. 05/09/18  Yes Loletha Grayer, MD  atorvastatin (LIPITOR) 40 MG tablet Take 1 tablet (40 mg total) by mouth daily at 6 PM. 06/16/18  Yes Gollan, Kathlene November, MD  budesonide (PULMICORT) 0.5 MG/2ML nebulizer solution Take 2 mLs (0.5 mg total) by nebulization 2 (two) times daily. 03/06/18 03/06/19 Yes Vaughan Basta, MD  carvedilol (COREG) 12.5 MG tablet Take 1 tablet (12.5 mg total) by mouth 2 (two) times daily. 06/21/18 09/19/18 Yes Dunn, Areta Haber, PA-C  cetirizine (ZYRTEC) 10 MG chewable tablet Chew 10 mg by mouth daily.   Yes [provider]  Cholecalciferol (VITAMIN D) 2000 units CAPS Take 2,000 Units by mouth every evening.   Yes [provider]  feeding supplement, ENSURE ENLIVE, (ENSURE ENLIVE) LIQD Take 237 mLs  by mouth 2 (two) times daily between meals. 05/08/18  Yes Wieting, Richard, MD  fluticasone (FLONASE) 50 MCG/ACT nasal spray Place 1 spray into both nostrils daily. 06/28/18  Yes [name], MD  formoterol (PERFOROMIST) 20 MCG/2ML nebulizer solution Take 2 mLs (20 mcg total) by nebulization 2 (two) times daily. J44.9 06/10/18  Yes Kasa, Maretta Bees, MD  furosemide (LASIX) 80 MG tablet TAKE 1 TABLET (80 MG TOTAL) BY MOUTH 2 (TWO) TIMES DAILY. 07/25/18  Yes Copland, Frederico Hamman, MD  gabapentin (NEURONTIN) 300 MG capsule Take 1 capsule (300 mg total) by mouth at bedtime. 03/23/18  Yes Copland, Frederico Hamman, MD  ipratropium-albuterol (DUONEB) 0.5-2.5 (3) MG/3ML SOLN Take 3 mLs by nebulization every 4 (four) hours as needed. DX:J44.9 05/10/18  Yes Kasa, Maretta Bees, MD  LORazepam (ATIVAN) 0.5 MG tablet TAKE 1 TABLET (0.5 MG TOTAL) BY MOUTH 3 (THREE) TIMES DAILY AS NEEDED FOR ANXIETY. 07/18/18  Yes Copland, Frederico Hamman, MD  losartan (COZAAR) 25 MG tablet Take 0.5 tablets (12.5 mg total) by mouth daily. 05/20/18  Yes Dunn, Areta Haber, PA-C  Multiple Vitamin (MULTIVITAMIN WITH  MINERALS) TABS tablet Take 1 tablet by mouth daily. 03/07/18  Yes Vaughan Basta, MD  nystatin (MYCOSTATIN) 100000 UNIT/ML suspension Use as directed 5 mLs (500,000 Units total) in the mouth or throat 4 (four) times daily. 08/15/18  Yes Copland, Frederico Hamman, MD  omeprazole (PRILOSEC) 20 MG capsule Take 20 mg by mouth daily.   Yes [provider]  ondansetron (ZOFRAN ODT) 4 MG disintegrating tablet Take 1 tablet (4 mg total) by mouth every 8 (eight) hours as needed for nausea or vomiting. 05/23/18  Yes Copland, Frederico Hamman, MD  oxymetazoline (AFRIN) 0.05 % nasal spray Place 1 spray into both nostrils daily as needed for congestion.   Yes [provider]  predniSONE (DELTASONE) 10 MG tablet Take 1 tablet (10 mg total) by mouth daily with breakfast. 04/29/18  Yes Copland, Frederico Hamman, MD  predniSONE (DELTASONE) 20 MG tablet Take two tablets daily for 3 days followed by one tablet daily for 4 days 07/29/18  Yes Ria Bush, MD  protein supplement (RESOURCE BENEPROTEIN) POWD Take 12-18 g by mouth 3 (three) times daily with meals. 03/06/18  Yes Vaughan Basta, MD  spironolactone (ALDACTONE) 25 MG tablet Take 1 tablet (25 mg total) by mouth daily. 06/16/18  Yes Gollan, Kathlene November, MD  tiZANidine (ZANAFLEX) 4 MG tablet Take 1 tablet (4 mg total) by mouth Nightly. 03/23/18  Yes Copland, Frederico Hamman, MD  triamcinolone (NASACORT) 55 MCG/ACT AERO nasal inhaler Place 1 spray into the nose 2 (two) times daily. 06/28/18 06/28/19 Yes [name], MD  vitamin C (VITAMIN C) 250 MG tablet Take 1 tablet (250 mg total) by mouth 2 (two) times daily. 03/06/18  Yes Vaughan Basta, MD  ivabradine (CORLANOR) 5 MG TABS tablet Take 1 tablet (5 mg total) by mouth 2 (two) times daily with a meal. Patient not taking: Reported on 09/05/2018 08/08/18   Alisa Graff, FNP   Review of Systems  Constitutional: Positive for fatigue. Negative for appetite change.  HENT: Negative for congestion and postnasal  drip.   Eyes: Negative.   Respiratory: Positive for cough and shortness of breath. Negative for chest tightness.   Cardiovascular: Positive for palpitations. Negative for chest pain and leg swelling.  Gastrointestinal: Negative for abdominal distention and abdominal pain.  Endocrine: Negative.   Genitourinary: Negative.   Musculoskeletal: Negative for back pain.  Skin: Negative.   Allergic/Immunologic: Negative.   Neurological: Negative for dizziness and light-headedness.  Hematological:  Negative for adenopathy. Bruises/bleeds easily.  Psychiatric/Behavioral: Negative for dysphoric mood and sleep disturbance. The patient is nervous/anxious.    Vitals:   09/05/18 1348  BP: 107/67  Pulse: (!) 121  Resp: 18  SpO2: 91%  Weight: 180 lb 2 oz (81.7 kg)  Height: _0  (1.778 m)   Wt Readings from Last 3 Encounters:  09/05/18 180 lb 2 oz (81.7 kg)  08/29/18 177 lb 11.2 oz (80.6 kg)  08/08/18 170 lb 8 oz (77.3 kg)   Lab Results  Component Value Date   CREATININE 0.91 07/25/2018   CREATININE 0.62 (L) 06/21/2018   CREATININE 0.61 06/07/2018    Physical Exam Vitals signs and nursing note reviewed.  Constitutional:      Appearance: He is well-developed.  HENT:     Head: Normocephalic and atraumatic.  Neck:     Musculoskeletal: Normal range of motion and neck supple.     Vascular: No JVD.  Cardiovascular:     Rate and Rhythm: Regular rhythm. Tachycardia present.  Pulmonary:     Effort: Pulmonary effort is normal.     Breath sounds: No wheezing or rales.  Abdominal:     General: There is no distension.     Palpations: Abdomen is soft.  Musculoskeletal:        General: No tenderness.  Skin:    General: Skin is warm and dry.  Neurological:     Mental Status: He is alert and oriented to person, place, and time.  Psychiatric:        Behavior: Behavior normal.        Thought Content: Thought content normal.    Assessment & Plan:  1: Chronic heart failure with reduced  ejection fraction- - NYHA class II - euvolemic today - weighing daily and he was reminded to call for an overnight weight gain of >2 pounds or a weekly weight gain of >5 pounds - weight up 10 pounds from the last time he was here 1 month ago; says that it's been a gradual increase as his appetite has increased.  - not adding salt to his food and his wife rinses canned foods and tries to read food labels. Reminded to closely follow a 2057m sodium diet  - saw cardiology (Dunn) 07/25/18 - unable to switch losartan to entresto due to BP - having echo done 10/26/18 - has received his flu vaccine for this season - BNP 05/01/18 was 1279.0  2: HTN- - BP looks good although slightly on the low side today - saw PCP (Copland) 05/16/18 - BMP 07/25/18 reviewed and showed sodium 133, potassium 4.5, creatinine 0.91 and GFR 88  3: ILD/ COPD- - saw pulmonologist (Kasa) 06/28/18  4: Tachycardia- - unable to afford ivabradine and he doesn't qualify for their patient assistance - HR had come down nicely in the 80's when he was on it and is now back into the low 100's-120's - will try to increase carvedilol; will increase his nighttime dose first and he was instructed to take 2 of his 12.537mtablets at bedtime for the next 2 weeks and then increase his morning dose to 2 tablets BID  Medication list was reviewed.   Return in 1 month or sooner for any questions/problems before then.

## 2018-09-05 ENCOUNTER — Encounter: Payer: Self-pay | Admitting: Family

## 2018-09-05 ENCOUNTER — Ambulatory Visit: Payer: Medicare HMO | Attending: Family | Admitting: Family

## 2018-09-05 VITALS — BP 107/67 | HR 121 | Resp 18 | Ht 70.0 in | Wt 180.1 lb

## 2018-09-05 DIAGNOSIS — Z8249 Family history of ischemic heart disease and other diseases of the circulatory system: Secondary | ICD-10-CM | POA: Diagnosis not present

## 2018-09-05 DIAGNOSIS — E1140 Type 2 diabetes mellitus with diabetic neuropathy, unspecified: Secondary | ICD-10-CM | POA: Insufficient documentation

## 2018-09-05 DIAGNOSIS — E785 Hyperlipidemia, unspecified: Secondary | ICD-10-CM | POA: Diagnosis not present

## 2018-09-05 DIAGNOSIS — K219 Gastro-esophageal reflux disease without esophagitis: Secondary | ICD-10-CM | POA: Diagnosis not present

## 2018-09-05 DIAGNOSIS — Z87891 Personal history of nicotine dependence: Secondary | ICD-10-CM | POA: Diagnosis not present

## 2018-09-05 DIAGNOSIS — I255 Ischemic cardiomyopathy: Secondary | ICD-10-CM | POA: Insufficient documentation

## 2018-09-05 DIAGNOSIS — Z951 Presence of aortocoronary bypass graft: Secondary | ICD-10-CM | POA: Diagnosis not present

## 2018-09-05 DIAGNOSIS — Z955 Presence of coronary angioplasty implant and graft: Secondary | ICD-10-CM | POA: Insufficient documentation

## 2018-09-05 DIAGNOSIS — C9210 Chronic myeloid leukemia, BCR/ABL-positive, not having achieved remission: Secondary | ICD-10-CM | POA: Insufficient documentation

## 2018-09-05 DIAGNOSIS — J849 Interstitial pulmonary disease, unspecified: Secondary | ICD-10-CM

## 2018-09-05 DIAGNOSIS — Z79899 Other long term (current) drug therapy: Secondary | ICD-10-CM | POA: Insufficient documentation

## 2018-09-05 DIAGNOSIS — Z8673 Personal history of transient ischemic attack (TIA), and cerebral infarction without residual deficits: Secondary | ICD-10-CM | POA: Insufficient documentation

## 2018-09-05 DIAGNOSIS — I10 Essential (primary) hypertension: Secondary | ICD-10-CM

## 2018-09-05 DIAGNOSIS — I110 Hypertensive heart disease with heart failure: Secondary | ICD-10-CM | POA: Insufficient documentation

## 2018-09-05 DIAGNOSIS — J9611 Chronic respiratory failure with hypoxia: Secondary | ICD-10-CM | POA: Insufficient documentation

## 2018-09-05 DIAGNOSIS — E119 Type 2 diabetes mellitus without complications: Secondary | ICD-10-CM | POA: Insufficient documentation

## 2018-09-05 DIAGNOSIS — I5022 Chronic systolic (congestive) heart failure: Secondary | ICD-10-CM | POA: Insufficient documentation

## 2018-09-05 DIAGNOSIS — J441 Chronic obstructive pulmonary disease with (acute) exacerbation: Secondary | ICD-10-CM | POA: Insufficient documentation

## 2018-09-05 DIAGNOSIS — Z7982 Long term (current) use of aspirin: Secondary | ICD-10-CM | POA: Insufficient documentation

## 2018-09-05 DIAGNOSIS — R000 Tachycardia, unspecified: Secondary | ICD-10-CM

## 2018-09-05 DIAGNOSIS — I2510 Atherosclerotic heart disease of native coronary artery without angina pectoris: Secondary | ICD-10-CM | POA: Diagnosis not present

## 2018-09-05 NOTE — Patient Instructions (Addendum)
Continue weighing daily and call for an overnight weight gain of > 2 pounds or a weekly weight gain of >5 pounds.  Increase nighttime carvedilol to 2 tablets at night for the next 2 weeks; continue 1 tablet in the morning.  After 2 weeks, increase the morning dose to 2 tablets so you will then be taking 2 tablets twice daily.

## 2018-09-06 ENCOUNTER — Other Ambulatory Visit: Payer: Self-pay | Admitting: Family Medicine

## 2018-09-06 NOTE — Telephone Encounter (Signed)
Last office visit 07/29/2018 for COPD exacerbation with Dr. Darnell Level.  Last refilled 04/29/2018 for #30 with 3 refills.  No future appointments.

## 2018-09-08 ENCOUNTER — Other Ambulatory Visit: Payer: Self-pay | Admitting: Cardiovascular Disease

## 2018-09-23 ENCOUNTER — Ambulatory Visit (INDEPENDENT_AMBULATORY_CARE_PROVIDER_SITE_OTHER): Payer: Medicare HMO | Admitting: Family Medicine

## 2018-09-23 ENCOUNTER — Encounter: Payer: Self-pay | Admitting: Family Medicine

## 2018-09-23 VITALS — BP 118/60 | HR 120 | Temp 98.2°F | Ht 70.0 in | Wt 184.8 lb

## 2018-09-23 DIAGNOSIS — J441 Chronic obstructive pulmonary disease with (acute) exacerbation: Secondary | ICD-10-CM

## 2018-09-23 MED ORDER — DOXYCYCLINE HYCLATE 100 MG PO CAPS: 100.0000 mg | ORAL_CAPSULE | Freq: Two times a day (BID) | ORAL | 0 refills | Status: DC

## 2018-09-23 MED ORDER — PREDNISONE 20 MG PO TABS: ORAL_TABLET | ORAL | 0 refills | Status: DC

## 2018-09-23 NOTE — Progress Notes (Signed)
Subjective:    Patient ID: Tony Watson, male    DOB: 16-Oct-1950, 68 y.o.   MRN: [id number]  HPI This is a 68 yo male, accompanied by his wife who is also being seen. He has productive cough, sore throat, sinus pressure and congestion x 3 weeks. He is on 4 liters of O2 and has home pulse ox and bp/hr readings. He has his portable oxygen today which goes to 3 l. He has been hearing wheezing, some mildly increased DOE. Large amounts of sputum at beginning of week which have decreased over the last couple of days. Sputum is thick and green/yellow/brown. No fever. Is on chronic prednisone for his ILD. Last COPD flare 11/19. This episode not as severe according to the patient.   Home health has recently been discontinued. All goals were met. Per patient and his wife, he has been doing much better lately.   Past Medical History:  Diagnosis Date  . Arthritis   . CAD (coronary artery disease)    a. inf MI 11/99 (in [place]) w/ PCI to [facility]; b. [place] 2004 [facility]): EF 50%, patent RCA stent, no obs dz; c. [place] 2008: EF 42%, inf infarct, no ischemia; d. R/LHC 9/19: 3-v dz w/ mild dif dz LM, m-dLAD 50, dLAD 60, D2 80, RI 80, mRCA-1 90, mRCA-2 20, patent RCA stent, CO/CI 3.7/2  . Chronic combined systolic (congestive) and diastolic (congestive) heart failure ([place])    a. 7/29019 Echo: EF 25-30%, sev diff HK, mild MR mod dil LA, mildly reduced RVSF, elevated CVP  . Chronic respiratory failure with hypoxia (HCC)    a. on 2L supplemental oxygen via [place] followed by pulmonology  . CML (chronic myelocytic leukemia) ([place])   . COPD (chronic obstructive pulmonary disease) ([place])   . Diabetic autonomic neuropathy associated with type 2 diabetes mellitus ([place]) 03/27/2015  . Diverticulosis 2002  . GERD (gastroesophageal reflux disease)   . HTN (hypertension)   . Hyperlipidemia   . ILD (interstitial lung disease) ([place])   . Ischemic cardiomyopathy   . Medical non-compliance   . Smoker   . Spinal stenosis   . TIA  (transient ischemic attack)    a. s/p PFO closure 2004 (in [place])  . Ulcer    Past Surgical History:  Procedure Laterality Date  . BACK SURGERY     patient denies-just lumbar punctures  . BRONCHOSCOPY    . CARDIAC CATHETERIZATION  11/99   CI per PM[facility]  . CATARACT EXTRACTION W/PHACO Right 11/24/2016   Procedure: CATARACT EXTRACTION PHACO AND INTRAOCULAR LENS PLACEMENT (IOC);  Surgeon: Birder Robson, MD;  Location: [facility] ORS;  Service: Ophthalmology;  Laterality: Right;  [place] 1:04.3 AP% 22.3 CDE 14.35 Fluid pack lot # [id number]  . CATARACT EXTRACTION W/PHACO Left 12/29/2016   Procedure: CATARACT EXTRACTION PHACO AND INTRAOCULAR LENS PLACEMENT (IOC);  Surgeon: Birder Robson, MD;  Location: [facility] ORS;  Service: Ophthalmology;  Laterality: Left;  [place] 00:52 AP% 18.5 CDE 9.67 Fluid pack lot # [id number]  . COLONOSCOPY    . COLONOSCOPY WITH PROPOFOL N/A 05/25/2017   Procedure: COLONOSCOPY WITH PROPOFOL;  Surgeon: Jonathon Bellows, MD;  Location: [facility] ENDOSCOPY;  Service: Gastroenterology;  Laterality: N/A;  . CORONARY ANGIOPLASTY     STENT  . CORONARY ARTERY BYPASS GRAFT     stent  . ESOPHAGOGASTRODUODENOSCOPY (EGD) WITH PROPOFOL N/A 05/25/2017   Procedure: ESOPHAGOGASTRODUODENOSCOPY (EGD) WITH PROPOFOL;  Surgeon: Jonathon Bellows, MD;  Location: [facility] ENDOSCOPY;  Service: Gastroenterology;  Laterality: N/A;  . EYE SURGERY    .  FLEXIBLE BRONCHOSCOPY N/A 05/19/2017   Procedure: FLEXIBLE BRONCHOSCOPY;  Surgeon: Wilhelmina Mcardle, MD;  Location: [facility] ORS;  Service: Pulmonary;  Laterality: N/A;  . open heart surgery  2004   PFO repair  . RIGHT/LEFT HEART CATH AND CORONARY ANGIOGRAPHY N/A 05/03/2018   Procedure: RIGHT/LEFT HEART CATH AND CORONARY ANGIOGRAPHY;  Surgeon: Nelva Bush, MD;  Location: [place] CV LAB;  Service: Cardiovascular;  Laterality: N/A;  . UPPER GI ENDOSCOPY     Family History  Problem Relation Age of Onset  . Coronary artery disease Other        family hx  . Breast cancer  Other        1st egree relative <50  . Cancer Mother   . Heart disease Father    Social History   Tobacco Use  . Smoking status: Former Smoker    Packs/day: 0.50    Years: 45.00    Pack years: 22.50    Types: Cigarettes    Last attempt to quit: 01/25/2018    Years since quitting: 0.6  . Smokeless tobacco: Never Used  . Tobacco comment: 1 ppd +40 years  Substance Use Topics  . Alcohol use: Not Currently    Alcohol/week: 0.0 standard drinks    Comment: weekly but last dose 1 month  . Drug use: No      Review of Systems Per HPI    Objective:   Physical Exam Vitals signs reviewed.  Constitutional:      Appearance: He is well-developed and normal weight.     Comments: Chronically ill appearing male in NAD.   HENT:     Head: Normocephalic and atraumatic.     Right Ear: Tympanic membrane and ear canal normal.     Left Ear: Tympanic membrane and ear canal normal.     Nose: Congestion and rhinorrhea present.     Mouth/Throat:     Mouth: Mucous membranes are moist.     Pharynx: Oropharynx is clear.  Eyes:     Conjunctiva/sclera: Conjunctivae normal.  Neck:     Musculoskeletal: Normal range of motion and neck supple. No neck rigidity or muscular tenderness.  Cardiovascular:     Rate and Rhythm: Regular rhythm. Tachycardia present.     Heart sounds: Normal heart sounds.  Pulmonary:     Effort: Pulmonary effort is normal.     Breath sounds: Wheezing and rhonchi present.  Musculoskeletal:     Right lower leg: No edema.     Left lower leg: No edema.  Lymphadenopathy:     Cervical: No cervical adenopathy.  Skin:    General: Skin is warm and dry.  Neurological:     Mental Status: He is alert and oriented to person, place, and time.  Psychiatric:        Mood and Affect: Mood normal.        Behavior: Behavior normal.        Thought Content: Thought content normal.        Judgment: Judgment normal.       BP 118/60 (BP Location: Right Arm, Patient Position: Sitting,  Cuff Size: Normal)   Pulse (!) 120   Temp 98.2 F (36.8 C) (Oral)   Ht _0  (1.778 m)   Wt 184 lb 12.8 oz (83.8 kg)   SpO2 (!) 89% Comment: 4L  BMI 26.52 kg/m  Repeat pulse/pulse ox- 104, 95%    Assessment & Plan:  1. COPD with acute exacerbation ([place]) - Provided written and verbal  information regarding diagnosis and treatment. -  Patient Instructions  I have sent an antibiotic and prednisone to your pharmacy  Continue breathing treatments   If severely worse over weekend go to the ER  If not better in 3-5 days, please follow up   - doxycycline (VIBRAMYCIN) 100 MG capsule; Take 1 capsule (100 mg total) by mouth 2 (two) times daily.  Dispense: 14 capsule; Refill: 0 - predniSONE (DELTASONE) 20 MG tablet; Two tablets x 3 days, one tablet x 4 days  Dispense: 10 tablet; Refill: 0   Clarene Reamer, FNP-BC  [facility], [facility]  09/23/2018 5:29 PM

## 2018-09-23 NOTE — Patient Instructions (Signed)
I have sent an antibiotic and prednisone to your pharmacy  Continue breathing treatments   If severely worse over weekend go to the ER  If not better in 3-5 days, please follow up

## 2018-09-26 ENCOUNTER — Telehealth: Payer: Self-pay | Admitting: *Deleted

## 2018-09-26 ENCOUNTER — Other Ambulatory Visit: Payer: Self-pay | Admitting: Family Medicine

## 2018-09-26 DIAGNOSIS — J441 Chronic obstructive pulmonary disease with (acute) exacerbation: Secondary | ICD-10-CM

## 2018-09-26 MED ORDER — AZITHROMYCIN 250 MG PO TABS: ORAL_TABLET | ORAL | 0 refills | Status: DC

## 2018-09-26 NOTE — Telephone Encounter (Signed)
Patient's wife called stating that she and her husband were seen Friday and given Doxycycline. Patient's wife stated that he is not any better and would like something different sent in maybe a Z-pak. [name] stated that the medication has caused her side effects. (See phone note on her chart). Asked Mrs. Heid if her husband is having side effects like she is and she stated that he is a little nauseated, but not like she is.  Mrs. Siefert stated that he is not any better and she was advised that it may take longer for the medication to work for her husband. Sending message to see if Tor Netters NP wants to change his medication also? [facility]

## 2018-09-26 NOTE — Telephone Encounter (Signed)
Patient was informed.  Patient understood and no questions, comments, or concerns at this time.  

## 2018-09-26 NOTE — Telephone Encounter (Signed)
New antibiotic sent in to patient's pharmacy. Please tell them to follow up if he is not getting better or is getting worse.

## 2018-10-01 ENCOUNTER — Other Ambulatory Visit: Payer: Self-pay | Admitting: Family Medicine

## 2018-10-03 ENCOUNTER — Other Ambulatory Visit: Payer: Self-pay | Admitting: Internal Medicine

## 2018-10-03 ENCOUNTER — Ambulatory Visit: Payer: Medicare HMO | Attending: Family | Admitting: Family

## 2018-10-03 ENCOUNTER — Encounter: Payer: Self-pay | Admitting: Family

## 2018-10-03 VITALS — BP 126/56 | HR 120 | Resp 18 | Ht 71.0 in | Wt 184.5 lb

## 2018-10-03 DIAGNOSIS — E785 Hyperlipidemia, unspecified: Secondary | ICD-10-CM | POA: Insufficient documentation

## 2018-10-03 DIAGNOSIS — J9611 Chronic respiratory failure with hypoxia: Secondary | ICD-10-CM | POA: Diagnosis not present

## 2018-10-03 DIAGNOSIS — I110 Hypertensive heart disease with heart failure: Secondary | ICD-10-CM | POA: Insufficient documentation

## 2018-10-03 DIAGNOSIS — I2510 Atherosclerotic heart disease of native coronary artery without angina pectoris: Secondary | ICD-10-CM | POA: Diagnosis not present

## 2018-10-03 DIAGNOSIS — Z951 Presence of aortocoronary bypass graft: Secondary | ICD-10-CM | POA: Insufficient documentation

## 2018-10-03 DIAGNOSIS — Z8249 Family history of ischemic heart disease and other diseases of the circulatory system: Secondary | ICD-10-CM | POA: Insufficient documentation

## 2018-10-03 DIAGNOSIS — R000 Tachycardia, unspecified: Secondary | ICD-10-CM

## 2018-10-03 DIAGNOSIS — Z7982 Long term (current) use of aspirin: Secondary | ICD-10-CM | POA: Insufficient documentation

## 2018-10-03 DIAGNOSIS — Z803 Family history of malignant neoplasm of breast: Secondary | ICD-10-CM | POA: Insufficient documentation

## 2018-10-03 DIAGNOSIS — E1143 Type 2 diabetes mellitus with diabetic autonomic (poly)neuropathy: Secondary | ICD-10-CM | POA: Diagnosis not present

## 2018-10-03 DIAGNOSIS — I10 Essential (primary) hypertension: Secondary | ICD-10-CM

## 2018-10-03 DIAGNOSIS — Z7952 Long term (current) use of systemic steroids: Secondary | ICD-10-CM | POA: Diagnosis not present

## 2018-10-03 DIAGNOSIS — M1990 Unspecified osteoarthritis, unspecified site: Secondary | ICD-10-CM | POA: Insufficient documentation

## 2018-10-03 DIAGNOSIS — Z87891 Personal history of nicotine dependence: Secondary | ICD-10-CM | POA: Diagnosis not present

## 2018-10-03 DIAGNOSIS — K219 Gastro-esophageal reflux disease without esophagitis: Secondary | ICD-10-CM | POA: Insufficient documentation

## 2018-10-03 DIAGNOSIS — I5022 Chronic systolic (congestive) heart failure: Secondary | ICD-10-CM | POA: Diagnosis present

## 2018-10-03 DIAGNOSIS — Z79899 Other long term (current) drug therapy: Secondary | ICD-10-CM | POA: Insufficient documentation

## 2018-10-03 DIAGNOSIS — J449 Chronic obstructive pulmonary disease, unspecified: Secondary | ICD-10-CM | POA: Diagnosis not present

## 2018-10-03 DIAGNOSIS — I255 Ischemic cardiomyopathy: Secondary | ICD-10-CM | POA: Diagnosis not present

## 2018-10-03 DIAGNOSIS — Z955 Presence of coronary angioplasty implant and graft: Secondary | ICD-10-CM | POA: Diagnosis not present

## 2018-10-03 DIAGNOSIS — J849 Interstitial pulmonary disease, unspecified: Secondary | ICD-10-CM

## 2018-10-03 MED ORDER — TORSEMIDE 20 MG PO TABS: 60.0000 mg | ORAL_TABLET | Freq: Every day | ORAL | 3 refills | Status: DC

## 2018-10-03 NOTE — Telephone Encounter (Signed)
Last office visit 09/26/2018 for COPD with acute exacerbation.  Last refilled 07/18/2018 for #60 with 1 refill.  No future appointments.

## 2018-10-03 NOTE — Patient Instructions (Addendum)
Continue weighing daily and call for an overnight weight gain of > 2 pounds or a weekly weight gain of >5 pounds.  Decrease carvedilol back to 1 tablet twice daily.  Stop furosemide and begin torsemide 3 tablets daily (60mg  total).

## 2018-10-03 NOTE — Progress Notes (Signed)
Patient ID: Tony Watson, male    DOB: [birth year], 68 y.o.   MRN: [id number]  HPI  Tony Watson is a 68 y/o male with a history of CAD, DM, hyperlipidemia, HTN, TIA, GERD, COPD, spinal stenosis, ILD, previous tobacco use and chronic heart failure.   Echo report from 03/03/18 reviewed and showed an EF of 25-30% along with mild Tony.   Right / left cardiac catheterization done 05/03/18 which showed:  1. Three-vessel coronary artery disease with moderate to severe calcification predominantly affecting the ramus intermedius and RCA (80 to 90% stenoses).  There is moderate LAD disease as well as 80% stenosis of small D2 branch. 2. Patent mid RCA stent with mild in-stent restenosis. 3. Mildly to moderately elevated left heart, right heart, and pulmonary artery pressures. 4:   Mildly reduced Fick cardiac output/index. Aggressive medical management recommended  Admitted 05/01/18 due to acute HF exacerbation. Cardiology and palliative care consults were obtained. Initially needed IV lasix and then transitioned to oral diuretics. Nebulizer and solu-medrol given for COPD/ ILD before transitioning to oral prednisone. Sonogram negative for DVT. Discharged after 7 days with home health. Was in the ED 04/24/18 due to COPD exacerbation where he was treated and released. Admitted 03/24/18 due to COPD exacerbation. Received IV steroids and nebulizers and continued on oxygen. IV diuretics given and cardiology consult obtained. Discharged after 4 days.   He presents today for a follow-up visit with a chief complaint of moderate shortness of breath upon minimal exertion. He describes this as chronic in nature having been present for several years although he does feel like it's worsened since his carvedilol was increased. He has associated fatigue, cough, pedal edema, palpitations, anxiety and slight weight gain along with this. He denies any difficulty sleeping, dizziness, abdominal distention or chest pain. Finished a  round of antibiotics and prednisone   Past Medical History:  Diagnosis Date  . Arthritis   . CAD (coronary artery disease)    a. inf MI 11/99 (in [place]) w/ PCI to [facility]; b. [place] 2004 [facility]): EF 50%, patent RCA stent, no obs dz; c. [place] 2008: EF 42%, inf infarct, no ischemia; d. R/LHC 9/19: 3-v dz w/ mild dif dz LM, m-dLAD 50, dLAD 60, D2 80, RI 80, mRCA-1 90, mRCA-2 20, patent RCA stent, CO/CI 3.7/2  . Chronic combined systolic (congestive) and diastolic (congestive) heart failure ([place])    a. 7/29019 Echo: EF 25-30%, sev diff HK, mild Tony mod dil LA, mildly reduced RVSF, elevated CVP  . Chronic respiratory failure with hypoxia (HCC)    a. on 2L supplemental oxygen via [employer] followed by pulmonology  . CML (chronic myelocytic leukemia) ([place])   . COPD (chronic obstructive pulmonary disease) ([place])   . Diabetic autonomic neuropathy associated with type 2 diabetes mellitus ([place]) 03/27/2015  . Diverticulosis 2002  . GERD (gastroesophageal reflux disease)   . HTN (hypertension)   . Hyperlipidemia   . ILD (interstitial lung disease) ([place])   . Ischemic cardiomyopathy   . Medical non-compliance   . Smoker   . Spinal stenosis   . TIA (transient ischemic attack)    a. s/p PFO closure 2004 (in [place])  . Ulcer    Past Surgical History:  Procedure Laterality Date  . BACK SURGERY     patient denies-just lumbar punctures  . BRONCHOSCOPY    . CARDIAC CATHETERIZATION  11/99   CI per [facility]  . CATARACT EXTRACTION W/PHACO Right 11/24/2016   Procedure: CATARACT EXTRACTION PHACO AND INTRAOCULAR  LENS PLACEMENT (IOC);  Surgeon: Birder Robson, MD;  Location: [facility] ORS;  Service: Ophthalmology;  Laterality: Right;  [place] 1:04.3 AP% 22.3 CDE 14.35 Fluid pack lot # [id number]  . CATARACT EXTRACTION W/PHACO Left 12/29/2016   Procedure: CATARACT EXTRACTION PHACO AND INTRAOCULAR LENS PLACEMENT (IOC);  Surgeon: Birder Robson, MD;  Location: [facility] ORS;  Service: Ophthalmology;  Laterality: Left;  [place] 00:52 AP% 18.5 CDE  9.67 Fluid pack lot # [id number]  . COLONOSCOPY    . COLONOSCOPY WITH PROPOFOL N/A 05/25/2017   Procedure: COLONOSCOPY WITH PROPOFOL;  Surgeon: Jonathon Bellows, MD;  Location: [facility] ENDOSCOPY;  Service: Gastroenterology;  Laterality: N/A;  . CORONARY ANGIOPLASTY     STENT  . CORONARY ARTERY BYPASS GRAFT     stent  . ESOPHAGOGASTRODUODENOSCOPY (EGD) WITH PROPOFOL N/A 05/25/2017   Procedure: ESOPHAGOGASTRODUODENOSCOPY (EGD) WITH PROPOFOL;  Surgeon: Jonathon Bellows, MD;  Location: [facility] ENDOSCOPY;  Service: Gastroenterology;  Laterality: N/A;  . EYE SURGERY    . FLEXIBLE BRONCHOSCOPY N/A 05/19/2017   Procedure: FLEXIBLE BRONCHOSCOPY;  Surgeon: Wilhelmina Mcardle, MD;  Location: [facility] ORS;  Service: Pulmonary;  Laterality: N/A;  . open heart surgery  2004   PFO repair  . RIGHT/LEFT HEART CATH AND CORONARY ANGIOGRAPHY N/A 05/03/2018   Procedure: RIGHT/LEFT HEART CATH AND CORONARY ANGIOGRAPHY;  Surgeon: Nelva Bush, MD;  Location: [place] CV LAB;  Service: Cardiovascular;  Laterality: N/A;  . UPPER GI ENDOSCOPY     Family History  Problem Relation Age of Onset  . Coronary artery disease Other        family hx  . Breast cancer Other        1st egree relative <50  . Cancer Mother   . Heart disease Father    Social History   Tobacco Use  . Smoking status: Former Smoker    Packs/day: 0.50    Years: 45.00    Pack years: 22.50    Types: Cigarettes    Last attempt to quit: 01/25/2018    Years since quitting: 0.6  . Smokeless tobacco: Never Used  . Tobacco comment: 1 ppd +40 years  Substance Use Topics  . Alcohol use: Not Currently    Alcohol/week: 0.0 standard drinks    Comment: weekly but last dose 1 month   No Known Allergies  Prior to Admission medications   Medication Sig Start Date End Date Taking? Authorizing Provider  acetaminophen (TYLENOL) 500 MG tablet Take 1,000 mg by mouth daily as needed for moderate pain or headache.   Yes [provider]  aspirin 81 MG chewable  tablet Chew 1 tablet (81 mg total) by mouth daily. 05/09/18  Yes Wieting, Richard, MD  atorvastatin (LIPITOR) 40 MG tablet TAKE 1 TABLET (40 MG TOTAL) BY MOUTH DAILY AT 6 PM. 09/08/18  Yes Gollan, Kathlene November, MD  budesonide (PULMICORT) 0.5 MG/2ML nebulizer solution Take 2 mLs (0.5 mg total) by nebulization 2 (two) times daily. 03/06/18 03/06/19 Yes Vaughan Basta, MD  carvedilol (COREG) 12.5 MG tablet Take 1 tablet (12.5 mg total) by mouth 2 (two) times daily. Patient taking differently: Take 25 mg by mouth 2 (two) times daily.  06/21/18 10/03/18 Yes Dunn, Areta Haber, PA-C  cetirizine (ZYRTEC) 10 MG chewable tablet Chew 10 mg by mouth daily.   Yes [provider]  Cholecalciferol (VITAMIN D) 2000 units CAPS Take 2,000 Units by mouth every evening.   Yes [provider]  feeding supplement, ENSURE ENLIVE, (ENSURE ENLIVE) LIQD Take 237 mLs by mouth 2 (two) times  daily between meals. 05/08/18  Yes Wieting, Richard, MD  fluticasone (FLONASE) 50 MCG/ACT nasal spray Place 1 spray into both nostrils daily. 06/28/18  Yes [name], MD  formoterol (PERFOROMIST) 20 MCG/2ML nebulizer solution Take 2 mLs (20 mcg total) by nebulization 2 (two) times daily. J44.9 06/10/18  Yes [name], MD  furosemide (LASIX) 80 MG tablet Take 1 tablet (80 mg total) by mouth daily. 10/03/18  Yes Copland, Frederico Hamman, MD  gabapentin (NEURONTIN) 300 MG capsule Take 1 capsule (300 mg total) by mouth at bedtime. 03/23/18  Yes Copland, Frederico Hamman, MD  ipratropium-albuterol (DUONEB) 0.5-2.5 (3) MG/3ML SOLN Take 3 mLs by nebulization every 4 (four) hours as needed. DX:J44.9 05/10/18  Yes Kasa, Maretta Bees, MD  LORazepam (ATIVAN) 0.5 MG tablet TAKE 1 TABLET (0.5 MG TOTAL) BY MOUTH 3 (THREE) TIMES DAILY AS NEEDED FOR ANXIETY. 10/03/18  Yes Copland, Frederico Hamman, MD  losartan (COZAAR) 25 MG tablet Take 0.5 tablets (12.5 mg total) by mouth daily. 05/20/18  Yes Dunn, Areta Haber, PA-C  Multiple Vitamin (MULTIVITAMIN WITH MINERALS) TABS tablet Take  1 tablet by mouth daily. 03/07/18  Yes Vaughan Basta, MD  nystatin (MYCOSTATIN) 100000 UNIT/ML suspension Use as directed 5 mLs (500,000 Units total) in the mouth or throat 4 (four) times daily. 08/15/18  Yes Copland, Frederico Hamman, MD  omeprazole (PRILOSEC) 20 MG capsule Take 20 mg by mouth daily.   Yes [provider]  ondansetron (ZOFRAN ODT) 4 MG disintegrating tablet Take 1 tablet (4 mg total) by mouth every 8 (eight) hours as needed for nausea or vomiting. 05/23/18  Yes Copland, Frederico Hamman, MD  oxymetazoline (AFRIN) 0.05 % nasal spray Place 1 spray into both nostrils daily as needed for congestion.   Yes [provider]  predniSONE (DELTASONE) 10 MG tablet TAKE 1 TABLET (10 MG TOTAL) BY MOUTH DAILY WITH BREAKFAST. 09/06/18  Yes Copland, Frederico Hamman, MD  protein supplement (RESOURCE BENEPROTEIN) POWD Take 12-18 g by mouth 3 (three) times daily with meals. 03/06/18  Yes Vaughan Basta, MD  spironolactone (ALDACTONE) 25 MG tablet TAKE 1 TABLET BY MOUTH EVERY DAY 09/08/18  Yes Gollan, Kathlene November, MD  tiZANidine (ZANAFLEX) 4 MG tablet Take 1 tablet (4 mg total) by mouth Nightly. 03/23/18  Yes Copland, Frederico Hamman, MD  triamcinolone (NASACORT) 55 MCG/ACT AERO nasal inhaler Place 1 spray into the nose 2 (two) times daily. 06/28/18 06/28/19 Yes [name], MD  vitamin C (VITAMIN C) 250 MG tablet Take 1 tablet (250 mg total) by mouth 2 (two) times daily. 03/06/18  Yes Vaughan Basta, MD    Review of Systems  Constitutional: Positive for fatigue. Negative for appetite change.  HENT: Negative for congestion and postnasal drip.   Eyes: Negative.   Respiratory: Positive for cough and shortness of breath. Negative for chest tightness.   Cardiovascular: Positive for palpitations and leg swelling (left ankle). Negative for chest pain.  Gastrointestinal: Negative for abdominal distention and abdominal pain.  Endocrine: Negative.   Genitourinary: Negative.   Musculoskeletal: Negative for  back pain.  Skin: Negative.   Allergic/Immunologic: Negative.   Neurological: Negative for dizziness and light-headedness.  Hematological: Negative for adenopathy. Bruises/bleeds easily.  Psychiatric/Behavioral: Negative for dysphoric mood and sleep disturbance. The patient is nervous/anxious.    Vitals:   10/03/18 1338  BP: (!) 126/56  Pulse: (!) 120  Resp: 18  SpO2: 93%  Weight: 184 lb 8 oz (83.7 kg)  Height: _0  (1.803 m)   Wt Readings from Last 3 Encounters:  10/03/18 184 lb 8 oz (83.7  kg)  09/23/18 184 lb 12.8 oz (83.8 kg)  09/05/18 180 lb 2 oz (81.7 kg)   Lab Results  Component Value Date   CREATININE 0.91 07/25/2018   CREATININE 0.62 (L) 06/21/2018   CREATININE 0.61 06/07/2018    Physical Exam Vitals signs and nursing note reviewed.  Constitutional:      Appearance: He is well-developed.  HENT:     Head: Normocephalic and atraumatic.  Neck:     Musculoskeletal: Normal range of motion and neck supple.     Vascular: No JVD.  Cardiovascular:     Rate and Rhythm: Regular rhythm. Tachycardia present.  Pulmonary:     Effort: Pulmonary effort is normal.     Breath sounds: No wheezing or rales.  Abdominal:     General: There is no distension.     Palpations: Abdomen is soft.  Musculoskeletal:        General: No tenderness.  Skin:    General: Skin is warm and dry.  Neurological:     Mental Status: He is alert and oriented to person, place, and time.  Psychiatric:        Behavior: Behavior normal.        Thought Content: Thought content normal.    Assessment & Plan:  1: Chronic heart failure with reduced ejection fraction- - NYHA class III - euvolemic today - weighing daily and he was reminded to call for an overnight weight gain of >2 pounds or a weekly weight gain of >5 pounds - weight up 4 pounds from last visit here 1 month ago - says that his shortness of breath has worsened as well since the carvedilol was increased; advised him to decrease his  carvedilol back down to 12.77m BID - will also stop his furosemide and begin torsemide 679mdaily - will check BMP at his next visit if not done elsewhere - not adding salt to his food and his wife rinses canned foods and tries to read food labels. Reminded to closely follow a 200062modium diet  - saw cardiology (Dunn) 07/25/18 - unable to switch losartan to entresto due to BP - having echo done 10/26/18 - has received his flu vaccine for this season - BNP 05/01/18 was 1279.0  2: HTN- - BP looks good today - saw PCP (GeCarlean Purl/14/2020 - BMP 07/25/18 reviewed and showed sodium 133, potassium 4.5, creatinine 0.91 and GFR 88  3: ILD/ COPD- - saw pulmonologist (Kasa) 06/28/18  4: Tachycardia- - unable to afford ivabradine and he doesn't qualify for their patient assistance - HR had come down nicely in the 80's when he was on it and is now back into the low 100's-120's - unable to tolerate the 64m82mrvedilol BID so decreased per above - wife continues to work on finding assistance for him to get the ivabradine  Patient did not bring his medications nor a list. Each medication was verbally reviewed with the patient and he was encouraged to bring the bottles to every visit to confirm accuracy of list.  Return in 1 month or sooner for any questions/problems before then.

## 2018-10-04 ENCOUNTER — Encounter: Payer: Self-pay | Admitting: Family

## 2018-10-04 MED ORDER — IPRATROPIUM-ALBUTEROL 0.5-2.5 (3) MG/3ML IN SOLN: 3.0000 mL | Freq: Four times a day (QID) | RESPIRATORY_TRACT | 0 refills | Status: DC

## 2018-10-04 NOTE — Telephone Encounter (Signed)
Call made to [facility], per Rep Advair diskus and HFA, Albuterol 0.63% neb and HFA, Anoro, Arnuity, Breo, Flovent HFA and Diskus, Incruse, Ipratropium Neb, Duo-neb, Spiriva respimat and handihaler, and symbicort are all covered.   Dr. Mortimer Fries please advise. Thanks.

## 2018-10-04 NOTE — Telephone Encounter (Signed)
Sent order for Duo-neb to pharmacy. Left message informing patient of the medication change. Call made to insurance to make aware. Nothing further is needed at this time.

## 2018-10-04 NOTE — Telephone Encounter (Signed)
Patient instructions  Please call you insurance company and obtain copy of your medication formulary   This will help your Pulmonologist to prescribe the most cost effective medications that your insurance company allows.

## 2018-10-04 NOTE — Telephone Encounter (Signed)
DOU-NEB every 6 hrs

## 2018-10-11 ENCOUNTER — Other Ambulatory Visit: Payer: Self-pay | Admitting: Family Medicine

## 2018-10-12 NOTE — Telephone Encounter (Signed)
Last office visit 09/26/2018 with Glenda Chroman for COPD.  Last refilled 08/15/2018 for 946 ml with 1 refill.  No future appointments with PCP.

## 2018-10-15 ENCOUNTER — Other Ambulatory Visit: Payer: Self-pay | Admitting: Family Medicine

## 2018-10-18 ENCOUNTER — Telehealth: Payer: Self-pay | Admitting: Family

## 2018-10-18 NOTE — Telephone Encounter (Signed)
Patient's wife called to say that he got approved through the [employer] for $1000 and he will be picking up the corlanor tomorrow and will start taking it as 5mg  BID.

## 2018-10-21 ENCOUNTER — Other Ambulatory Visit: Payer: Self-pay

## 2018-10-21 ENCOUNTER — Ambulatory Visit (INDEPENDENT_AMBULATORY_CARE_PROVIDER_SITE_OTHER): Payer: Medicare HMO

## 2018-10-21 DIAGNOSIS — R0602 Shortness of breath: Secondary | ICD-10-CM

## 2018-10-21 MED ORDER — PERFLUTREN LIPID MICROSPHERE
1.0000 mL | INTRAVENOUS | Status: AC | PRN
  Administered 2018-10-21: 2 mL via INTRAVENOUS

## 2018-10-25 ENCOUNTER — Telehealth: Payer: Self-pay | Admitting: Cardiovascular Disease

## 2018-10-25 NOTE — Telephone Encounter (Signed)
COVID-19 Pre-Screening:  1. Have you been in contact with someone who was sick?  No 2. Do you have any of the following symptoms (cough, fever, muscle pain, vomiting, diarrhea, weakness abdominal pain, rash, red eye, bruising or bleeding, joint pain, severe headache)?  No 3. Have you travelled internationally or out of state in the last month?  No   4. Do you need any refills at this time?  No  Patient aware of the following: Please be advised that we require, no one but yourself to come to appointment. If necessary, only one visitor may come with you into the building. They will also be asked the same screening questions. You will be contacted at a later time to reschedule. However, this will depend on ongoing evaluation of the Covid-19 situation.  Please call us if any new questions or concerns arise. We are here for advice.  Routing to COVID cancel pool.

## 2018-10-26 ENCOUNTER — Other Ambulatory Visit: Payer: Medicare HMO

## 2018-10-26 ENCOUNTER — Ambulatory Visit: Payer: Medicare HMO | Admitting: Cardiovascular Disease

## 2018-10-28 ENCOUNTER — Other Ambulatory Visit: Payer: Self-pay | Admitting: Internal Medicine

## 2018-10-28 MED ORDER — IPRATROPIUM-ALBUTEROL 0.5-2.5 (3) MG/3ML IN SOLN: 3.0000 mL | RESPIRATORY_TRACT | 5 refills | Status: DC | PRN

## 2018-10-28 NOTE — Telephone Encounter (Signed)
Called patient: he needs Duoneb refilled and order corrected. He takes 61mL every 4 hours. #1620 mL sent to [facility] mail order per pt request. Nothing further needed.

## 2018-10-31 ENCOUNTER — Other Ambulatory Visit: Payer: Self-pay | Admitting: Internal Medicine

## 2018-10-31 ENCOUNTER — Ambulatory Visit: Payer: Medicare HMO | Admitting: Family

## 2018-10-31 MED ORDER — IPRATROPIUM-ALBUTEROL 0.5-2.5 (3) MG/3ML IN SOLN: 3.0000 mL | Freq: Four times a day (QID) | RESPIRATORY_TRACT | 0 refills | Status: DC

## 2018-11-01 NOTE — Telephone Encounter (Signed)
No ans no vm   °

## 2018-11-02 ENCOUNTER — Telehealth: Payer: Self-pay | Admitting: Cardiovascular Disease

## 2018-11-02 NOTE — Telephone Encounter (Signed)
lmov to schedule Evisit  °

## 2018-11-02 NOTE — Telephone Encounter (Signed)
TELEPHONE CALL NOTE  Tony Watson has been deemed a candidate for a follow-up tele-health visit to limit community exposure during the Covid-19 pandemic. I spoke with the patient via phone to ensure availability of phone/video source, confirm preferred email & phone number, discuss instructions and expectations, and review consent.   I reminded Tony Watson to be prepared with any vital sign and/or heart rhythm information that could potentially be obtained via home monitoring, at the time of his visit.  Finally, I reminded Tony Watson to expect an e-mail containing a link for their video-based visit approximately 15 minutes before his visit, or alternatively, a phone call at the time of his visit if his visit is planned to be a phone encounter.  Did the patient verbally consent to treatment as below? yes  Tony Watson 11/02/2018 1:42 PM  DOWNLOADING THE SOFTWARE (If applicable)  Download the [employer] app to enable video and telephone visits with your [facility] Provider.   Instructions for downloading [employer]: - Go to https://www.webex.com/downloads.html and follow the instructions - If you have technical difficulties with downloading WebEx, please call WebEx at [phone]. - Once the app is downloaded (can be done on either mobile or desktop computer), go to Settings in the upper left hand corner.  Be sure that camera and audio are enabled.  - You will receive an email message with a link to the meeting with a time to join for your tele-health visit.  - Please download the app and have settings configured prior to the appointment time.    CONSENT FOR TELE-HEALTH VISIT - PLEASE REVIEW  I hereby voluntarily request, consent and authorize [place] and its employed or contracted physicians, physician assistants, nurse practitioners or other licensed health care professionals (the Practitioner), to provide me with telemedicine health care services (the  Services") as deemed necessary by the treating Practitioner. I acknowledge and consent to receive the Services by the Practitioner via telemedicine. I understand that the telemedicine visit will involve communicating with the Practitioner through live audiovisual communication technology and the disclosure of certain medical information by electronic transmission. I acknowledge that I have been given the opportunity to request an in-person assessment or other available alternative prior to the telemedicine visit and am voluntarily participating in the telemedicine visit.  I understand that I have the right to withhold or withdraw my consent to the use of telemedicine in the course of my care at any time, without affecting my right to future care or treatment, and that the Practitioner or I may terminate the telemedicine visit at any time. I understand that I have the right to inspect all information obtained and/or recorded in the course of the telemedicine visit and may receive copies of available information for a reasonable fee.  I understand that some of the potential risks of receiving the Services via telemedicine include:   Delay or interruption in medical evaluation due to technological equipment failure or disruption;  Information transmitted may not be sufficient (e.g. poor resolution of images) to allow for appropriate medical decision making by the Practitioner; and/or   In rare instances, security protocols could fail, causing a breach of personal health information.  Furthermore, I acknowledge that it is my responsibility to provide information about my medical history, conditions and care that is complete and accurate to the best of my ability. I acknowledge that Practitioner's advice, recommendations, and/or decision may be based on factors not within their control, such as incomplete or  inaccurate data provided by me or distortions of diagnostic images or specimens that may result from  electronic transmissions. I understand that the practice of medicine is not an exact science and that Practitioner makes no warranties or guarantees regarding treatment outcomes. I acknowledge that I will receive a copy of this consent concurrently upon execution via email to the email address I last provided but may also request a printed copy by calling the office of [place].    I understand that my insurance will be billed for this visit.   I have read or had this consent read to me.  I understand the contents of this consent, which adequately explains the benefits and risks of the Services being provided via telemedicine.   I have been provided ample opportunity to ask questions regarding this consent and the Services and have had my questions answered to my satisfaction.  I give my informed consent for the services to be provided through the use of telemedicine in my medical care  By participating in this telemedicine visit I agree to the above.

## 2018-11-02 NOTE — Telephone Encounter (Signed)
Video evisit scheduled for 4/6 with Dr Rockey Situ at [facility]

## 2018-11-03 ENCOUNTER — Other Ambulatory Visit: Payer: Self-pay | Admitting: *Deleted

## 2018-11-03 MED ORDER — GABAPENTIN 300 MG PO CAPS: 300.0000 mg | ORAL_CAPSULE | Freq: Every day | ORAL | 1 refills | Status: DC

## 2018-11-03 NOTE — Telephone Encounter (Signed)
Last office visit 09/23/2018 for hospital follow up.  Last refilled 03/23/2018 for #90 with 1 refill.  No future appointments with PCP.

## 2018-11-04 ENCOUNTER — Other Ambulatory Visit: Payer: Self-pay

## 2018-11-04 MED ORDER — IPRATROPIUM-ALBUTEROL 0.5-2.5 (3) MG/3ML IN SOLN: 3.0000 mL | RESPIRATORY_TRACT | 5 refills | Status: DC | PRN

## 2018-11-04 NOTE — Telephone Encounter (Signed)
lmtcb x1 for pt. 

## 2018-11-04 NOTE — Telephone Encounter (Signed)
Stated that refill order should have been for 7 boxes and he only got 4. Wants to know can he get remaining

## 2018-11-04 NOTE — Addendum Note (Signed)
Addended by: Maryanna Shape A on: 11/04/2018 12:18 PM   Modules accepted: Orders

## 2018-11-04 NOTE — Telephone Encounter (Addendum)
Called and spoke to [employer] with [facility], who states that additional boxes of duoneb would be mailed out to patient.  I have made pt aware of this information.  Rx has been resent to [facility] per [facility]'s request. Nothing further is needed.

## 2018-11-07 ENCOUNTER — Encounter: Payer: Self-pay | Admitting: *Deleted

## 2018-11-07 NOTE — Telephone Encounter (Signed)
This encounter was created in error - please disregard.

## 2018-11-11 ENCOUNTER — Telehealth: Payer: Self-pay | Admitting: *Deleted

## 2018-11-11 MED ORDER — PREDNISONE 10 MG PO TABS: 10.0000 mg | ORAL_TABLET | Freq: Every day | ORAL | 0 refills | Status: DC

## 2018-11-11 MED ORDER — ONDANSETRON 4 MG PO TBDP: 4.0000 mg | ORAL_TABLET | Freq: Three times a day (TID) | ORAL | 1 refills | Status: DC | PRN

## 2018-11-11 MED ORDER — LORAZEPAM 0.5 MG PO TABS: 0.5000 mg | ORAL_TABLET | Freq: Three times a day (TID) | ORAL | 1 refills | Status: DC | PRN

## 2018-11-11 NOTE — Telephone Encounter (Signed)
Last office visit 09/23/2018 with Glenda Chroman for COPD with acute exacerbation.  Last refilled Lorazepam 10/03/2018 for #60 with 1 refill.  Zofran 05/23/2018 for #30 with 3 refills.  Prednisone 09/06/2018 for #30 with 3 refills.  [facility] is asking for 90 day supply.  No future appointments with PCP.

## 2018-11-11 NOTE — Telephone Encounter (Signed)
I am ok with filling these for now, but I think having him do an e-visit with me in the next month is a good idea.  It looks like he also has not seen pulmonology for some time, too.

## 2018-11-11 NOTE — Progress Notes (Addendum)
Virtual Visit via Video Note   This visit type was conducted due to national recommendations for restrictions regarding the COVID-19 Pandemic (e.g. social distancing) in an effort to limit this patient's exposure and mitigate transmission in our community.  Due to her co-morbid illnesses, this patient is at least at moderate risk for complications without adequate follow up.  This format is felt to be most appropriate for this patient at this time.  All issues noted in this document were discussed and addressed.  A limited physical exam was performed with this format.  Please refer to the patient's chart for her consent to telehealth for [facility].    Date:  11/14/2018   ID:  Tony Watson, DOB 1951-02-16, MRN [id number]  Patient Location:  [address] [name] [address]   Provider location:   [facility], [place] office  PCP:  Owens Loffler, MD  Cardiologist:  Arvid Right [facility]  Chief Complaint:  SOB, foot swelling    History of Present Illness:    Tony Watson is a 68 y.o. male who presents via audio/video conferencing for a telehealth visit today.   The patient does not symptoms concerning for COVID-19 infection (fever, chills, cough, or new SHORTNESS OF BREATH).   Patient has a past medical history of coronary artery disease  status post inferior myocardial infarction in 1999  PCI to the mid RCA  recurrent TIAs with surgical PFO closure in 2004.  hyperlipidemia,  hypertension  diabetes.  Smoker, quit early 2019 Ejection fraction 25-30%  compliance issues with medications.  On chronic oxygen, 4 L continuous, rarely 5 Who presents for follow-up of his coronary disease  hospital on 05/01/18 with acute on chronic respiratory failure secondary to ILD/AECOPD and HFrEF.  Receive steroids bronchodilators No antibiotics as it was felt to be no pneumonia Prednisone at discharge Unable to advance medications for heart failure given low blood  pressure  shortness of breath with exertion 4 L iters [place]  Denies any chest pain concerning for angina Might have tachycardia with his albuterol nebulizer In general with no palpitations  Followed in CHF clinic and by pulmonary clinic  baseline weight is approximately 189 to 198 pounds throughout his entire adult life.   Goal weight 180 to 190 Right now 186.4 pounds  Takes prednisone daily 10 mg for COPD Reports breathing is stable  No chest pain Minimally active but denies any anginal symptoms Compliant with his medications, no side effects   Echo 10/2018: Recent study reviewed with him in detail Mild improved EF compared to previous study ejection fraction of 30-35%.  Labs: HBA1C 6.3 No recent lipid panel available, previously LDL in the 70s   Prior CV studies:   The following studies were reviewed today:  Previous testing stress test was in 2008 which showed evidence of inferior infarct without significant ischemia. Ejection fraction was 42%.   R/LHC on 05/03/2018 that showed 3-vessel CAD with mild diffuse disease throughout the left main, mid to distal LAD 50% stenosis, distal LAD 60% stenosis, D2 80% stenosis, ramus 80% stenosis, mid RCA-1 90% stenosis, mid RCA-2 20% stenosis, patent mid RCA stent with mild ISR, post atrio 50%. The coronary arteries were overall moderately to severely calcified. Mildly to moderately elevated left heart, right heart, and pulmonary artery pressures.  Aggressive medical therapy was advised with reservation of PCI to the RCA/ramus for refractory symptoms despite optimal medical therapy.   Past Medical History:  Diagnosis Date  . Arthritis   .  CAD (coronary artery disease)    a. inf MI 11/99 (in [place]) w/ PCI to [facility]; b. [place] 2004 [facility]): EF 50%, patent RCA stent, no obs dz; c. MV 2008: EF 42%, inf infarct, no ischemia; d. R/LHC 9/19: 3-v dz w/ mild dif dz LM, m-dLAD 50, dLAD 60, D2 80, RI 80, mRCA-1 90, mRCA-2 20, patent RCA stent, CO/CI  3.7/2  . Chronic combined systolic (congestive) and diastolic (congestive) heart failure ([place])    a. 7/29019 Echo: EF 25-30%, sev diff HK, mild MR mod dil LA, mildly reduced RVSF, elevated CVP  . Chronic respiratory failure with hypoxia (HCC)    a. on 2L supplemental oxygen via [place] followed by pulmonology  . CML (chronic myelocytic leukemia) ([place])   . COPD (chronic obstructive pulmonary disease) ([place])   . Diabetic autonomic neuropathy associated with type 2 diabetes mellitus ([place]) 03/27/2015  . Diverticulosis 2002  . GERD (gastroesophageal reflux disease)   . HTN (hypertension)   . Hyperlipidemia   . ILD (interstitial lung disease) ([place])   . Ischemic cardiomyopathy   . Medical non-compliance   . Smoker   . Spinal stenosis   . TIA (transient ischemic attack)    a. s/p PFO closure 2004 (in [place])  . Ulcer    Past Surgical History:  Procedure Laterality Date  . BACK SURGERY     patient denies-just lumbar punctures  . BRONCHOSCOPY    . CARDIAC CATHETERIZATION  11/99   CI per PMH  . CATARACT EXTRACTION W/PHACO Right 11/24/2016   Procedure: CATARACT EXTRACTION PHACO AND INTRAOCULAR LENS PLACEMENT (IOC);  Surgeon: Birder Robson, MD;  Location: [facility] ORS;  Service: Ophthalmology;  Laterality: Right;  [place] 1:04.3 AP% 22.3 CDE 14.35 Fluid pack lot # [id number]  . CATARACT EXTRACTION W/PHACO Left 12/29/2016   Procedure: CATARACT EXTRACTION PHACO AND INTRAOCULAR LENS PLACEMENT (IOC);  Surgeon: Birder Robson, MD;  Location: [facility] ORS;  Service: Ophthalmology;  Laterality: Left;  [place] 00:52 AP% 18.5 CDE 9.67 Fluid pack lot # [id number]  . COLONOSCOPY    . COLONOSCOPY WITH PROPOFOL N/A 05/25/2017   Procedure: COLONOSCOPY WITH PROPOFOL;  Surgeon: Jonathon Bellows, MD;  Location: [facility] ENDOSCOPY;  Service: Gastroenterology;  Laterality: N/A;  . CORONARY ANGIOPLASTY     STENT  . CORONARY ARTERY BYPASS GRAFT     stent  . ESOPHAGOGASTRODUODENOSCOPY (EGD) WITH PROPOFOL N/A 05/25/2017   Procedure:  ESOPHAGOGASTRODUODENOSCOPY (EGD) WITH PROPOFOL;  Surgeon: Jonathon Bellows, MD;  Location: [facility] ENDOSCOPY;  Service: Gastroenterology;  Laterality: N/A;  . EYE SURGERY    . FLEXIBLE BRONCHOSCOPY N/A 05/19/2017   Procedure: FLEXIBLE BRONCHOSCOPY;  Surgeon: Wilhelmina Mcardle, MD;  Location: [facility] ORS;  Service: Pulmonary;  Laterality: N/A;  . open heart surgery  2004   PFO repair  . RIGHT/LEFT HEART CATH AND CORONARY ANGIOGRAPHY N/A 05/03/2018   Procedure: RIGHT/LEFT HEART CATH AND CORONARY ANGIOGRAPHY;  Surgeon: Nelva Bush, MD;  Location: [facility] CV LAB;  Service: Cardiovascular;  Laterality: N/A;  . UPPER GI ENDOSCOPY       Current Meds  Medication Sig  . acetaminophen (TYLENOL) 500 MG tablet Take 1,000 mg by mouth daily as needed for moderate pain or headache.  Marland Kitchen aspirin 81 MG chewable tablet Chew 1 tablet (81 mg total) by mouth daily.  Marland Kitchen atorvastatin (LIPITOR) 40 MG tablet TAKE 1 TABLET (40 MG TOTAL) BY MOUTH DAILY AT 6 PM.  . budesonide (PULMICORT) 0.5 MG/2ML nebulizer solution Take 2 mLs (0.5 mg total) by nebulization 2 (two) times daily.  Marland Kitchen  cetirizine (ZYRTEC) 10 MG chewable tablet Chew 10 mg by mouth daily.  . Cholecalciferol (VITAMIN D) 2000 units CAPS Take 2,000 Units by mouth every evening.  . fluticasone (FLONASE) 50 MCG/ACT nasal spray Place 1 spray into both nostrils daily.  . formoterol (PERFOROMIST) 20 MCG/2ML nebulizer solution Take 2 mLs (20 mcg total) by nebulization 2 (two) times daily. J44.9  . gabapentin (NEURONTIN) 300 MG capsule Take 1 capsule (300 mg total) by mouth at bedtime.  Marland Kitchen ipratropium-albuterol (DUONEB) 0.5-2.5 (3) MG/3ML SOLN Take 3 mLs by nebulization every 4 (four) hours as needed. DX:J44.9  . ivabradine (CORLANOR) 5 MG TABS tablet Take 1 tablet (5 mg total) by mouth 2 (two) times daily with a meal.  . LORazepam (ATIVAN) 0.5 MG tablet Take 1 tablet (0.5 mg total) by mouth 3 (three) times daily as needed for anxiety.  Marland Kitchen losartan (COZAAR) 25 MG tablet Take  0.5 tablets (12.5 mg total) by mouth daily.  . Multiple Vitamin (MULTIVITAMIN WITH MINERALS) TABS tablet Take 1 tablet by mouth daily.  Marland Kitchen nystatin (MYCOSTATIN) 100000 UNIT/ML suspension TAKE AS DIRECTED 5 MLS (500,000 UNITS TOTAL) IN THE MOUTH OR THROAT 4 TIMES DAILY  . omeprazole (PRILOSEC) 20 MG capsule Take 20 mg by mouth daily.  . ondansetron (ZOFRAN ODT) 4 MG disintegrating tablet Take 1 tablet (4 mg total) by mouth every 8 (eight) hours as needed for nausea or vomiting.  Marland Kitchen oxymetazoline (AFRIN) 0.05 % nasal spray Place 1 spray into both nostrils daily as needed for congestion.  . predniSONE (DELTASONE) 10 MG tablet Take 1 tablet (10 mg total) by mouth daily with breakfast.  . protein supplement (RESOURCE BENEPROTEIN) POWD Take 12-18 g by mouth 3 (three) times daily with meals.  Marland Kitchen spironolactone (ALDACTONE) 25 MG tablet TAKE 1 TABLET BY MOUTH EVERY DAY  . tiZANidine (ZANAFLEX) 4 MG tablet Take 1 tablet (4 mg total) by mouth Nightly.  . torsemide (DEMADEX) 20 MG tablet Take 3 tablets (60 mg total) by mouth daily.  Marland Kitchen triamcinolone (NASACORT) 55 MCG/ACT AERO nasal inhaler Place 1 spray into the nose 2 (two) times daily.  . vitamin C (VITAMIN C) 250 MG tablet Take 1 tablet (250 mg total) by mouth 2 (two) times daily.     Allergies:   Patient has no known allergies.   Social History   Tobacco Use  . Smoking status: Former Smoker    Packs/day: 0.50    Years: 45.00    Pack years: 22.50    Types: Cigarettes    Last attempt to quit: 01/25/2018    Years since quitting: 0.8  . Smokeless tobacco: Never Used  . Tobacco comment: 1 ppd +40 years  Substance Use Topics  . Alcohol use: Not Currently    Alcohol/week: 0.0 standard drinks    Comment: weekly but last dose 1 month  . Drug use: No     Current Outpatient Medications on File Prior to Visit  Medication Sig Dispense Refill  . acetaminophen (TYLENOL) 500 MG tablet Take 1,000 mg by mouth daily as needed for moderate pain or headache.     Marland Kitchen aspirin 81 MG chewable tablet Chew 1 tablet (81 mg total) by mouth daily. 30 tablet 0  . atorvastatin (LIPITOR) 40 MG tablet TAKE 1 TABLET (40 MG TOTAL) BY MOUTH DAILY AT 6 PM. 90 tablet 0  . budesonide (PULMICORT) 0.5 MG/2ML nebulizer solution Take 2 mLs (0.5 mg total) by nebulization 2 (two) times daily. 1440 mL 0  . cetirizine (ZYRTEC) 10 MG  chewable tablet Chew 10 mg by mouth daily.    . Cholecalciferol (VITAMIN D) 2000 units CAPS Take 2,000 Units by mouth every evening.    . fluticasone (FLONASE) 50 MCG/ACT nasal spray Place 1 spray into both nostrils daily. 16 g 2  . formoterol (PERFOROMIST) 20 MCG/2ML nebulizer solution Take 2 mLs (20 mcg total) by nebulization 2 (two) times daily. J44.9 500 mL 12  . gabapentin (NEURONTIN) 300 MG capsule Take 1 capsule (300 mg total) by mouth at bedtime. 90 capsule 1  . ipratropium-albuterol (DUONEB) 0.5-2.5 (3) MG/3ML SOLN Take 3 mLs by nebulization every 4 (four) hours as needed. DX:J44.9 1620 mL 5  . ivabradine (CORLANOR) 5 MG TABS tablet Take 1 tablet (5 mg total) by mouth 2 (two) times daily with a meal. 60 tablet 5  . LORazepam (ATIVAN) 0.5 MG tablet Take 1 tablet (0.5 mg total) by mouth 3 (three) times daily as needed for anxiety. 120 tablet 1  . losartan (COZAAR) 25 MG tablet Take 0.5 tablets (12.5 mg total) by mouth daily. 45 tablet 2  . Multiple Vitamin (MULTIVITAMIN WITH MINERALS) TABS tablet Take 1 tablet by mouth daily. 30 tablet 0  . nystatin (MYCOSTATIN) 100000 UNIT/ML suspension TAKE AS DIRECTED 5 MLS (500,000 UNITS TOTAL) IN THE MOUTH OR THROAT 4 TIMES DAILY 946 mL 1  . omeprazole (PRILOSEC) 20 MG capsule Take 20 mg by mouth daily.    . ondansetron (ZOFRAN ODT) 4 MG disintegrating tablet Take 1 tablet (4 mg total) by mouth every 8 (eight) hours as needed for nausea or vomiting. 90 tablet 1  . oxymetazoline (AFRIN) 0.05 % nasal spray Place 1 spray into both nostrils daily as needed for congestion.    . predniSONE (DELTASONE) 10 MG tablet  Take 1 tablet (10 mg total) by mouth daily with breakfast. 90 tablet 0  . protein supplement (RESOURCE BENEPROTEIN) POWD Take 12-18 g by mouth 3 (three) times daily with meals. 227 g 0  . spironolactone (ALDACTONE) 25 MG tablet TAKE 1 TABLET BY MOUTH EVERY DAY 90 tablet 0  . tiZANidine (ZANAFLEX) 4 MG tablet Take 1 tablet (4 mg total) by mouth Nightly. 90 tablet 3  . torsemide (DEMADEX) 20 MG tablet Take 3 tablets (60 mg total) by mouth daily. 90 tablet 3  . triamcinolone (NASACORT) 55 MCG/ACT AERO nasal inhaler Place 1 spray into the nose 2 (two) times daily. 1 Inhaler 2  . vitamin C (VITAMIN C) 250 MG tablet Take 1 tablet (250 mg total) by mouth 2 (two) times daily. 30 tablet 0  . carvedilol (COREG) 12.5 MG tablet Take 1 tablet (12.5 mg total) by mouth 2 (two) times daily. 180 tablet 3   No current facility-administered medications on file prior to visit.      Family Hx: The patient's family history includes Breast cancer in an other family member; Cancer in his mother; Coronary artery disease in an other family member; Heart disease in his father.  ROS:   Please see the history of present illness.    Review of Systems  Constitutional: Negative.   Respiratory: Positive for shortness of breath.   Cardiovascular: Positive for leg swelling.  Gastrointestinal: Negative.   Musculoskeletal: Negative.   Neurological: Negative.   Psychiatric/Behavioral: Negative.   All other systems reviewed and are negative.     Labs/Other Tests and Data Reviewed:    Recent Labs: 03/03/2018: TSH 1.930 05/01/2018: B Natriuretic Peptide 1,279.0 05/08/2018: Magnesium 2.1 05/16/2018: ALT 79 07/25/2018: BUN 22; Creatinine, Ser 0.91; Potassium  4.5; Sodium 133 08/29/2018: Hemoglobin 11.3; Platelets 164   Recent Lipid Panel Lab Results  Component Value Date/Time   CHOL 144 07/16/2016 08:44 AM   CHOL 106 02/06/2013 04:55 AM   TRIG 159.0 (H) 07/16/2016 08:44 AM   TRIG 75 02/06/2013 04:55 AM   HDL 32.90 (L)  07/16/2016 08:44 AM   HDL 37 (L) 02/06/2013 04:55 AM   CHOLHDL 4 07/16/2016 08:44 AM   LDLCALC 79 07/16/2016 08:44 AM   LDLCALC 54 02/06/2013 04:55 AM   LDLDIRECT 78.0 10/12/2013 11:30 AM    Wt Readings from Last 3 Encounters:  11/14/18 186 lb 4 oz (84.5 kg)  10/03/18 184 lb 8 oz (83.7 kg)  09/23/18 184 lb 12.8 oz (83.8 kg)     Exam:    Vital Signs:  BP 135/60   Pulse 74   Ht _0  (1.803 m)   Wt 186 lb 4 oz (84.5 kg)   BMI 25.98 kg/m   Well nourished, well developed male in no acute distress. Constitutional:  oriented to person, place, and time. No distress.  Head: Normocephalic and atraumatic.  Eyes:  no discharge. No scleral icterus.  Neck: Normal range of motion. Neck supple.  Pulmonary/Chest: No audible wheezing, no distress, appears comfortable on 4 L nasal cannula Musculoskeletal: Normal range of motion.  no  tenderness or deformity.  Neurological:   Coordination normal. Full exam not performed Skin:  No rash Psychiatric:  normal mood and affect. behavior is normal. Thought content normal.    ASSESSMENT & PLAN:    Chronic systolic heart failure (HCC) Reports having mild swelling in his feet, slightly above the ankles New for him Possibly exacerbated by drinking more at home, sitting with his feet down Recommend he moderate his fluid intake, salt intake, leg elevation, and hose If symptoms persist he could take extra torsemide 1-2 tabs after lunch If symptoms get worse recommended he call our office  ILD (interstitial lung disease) ([place]) Followed by pulmonary On chronic steroids  Coronary artery disease of native artery of native heart with stable angina pectoris ([place]) Currently with no symptoms of angina. No further workup at this time. Continue current medication regimen.  Stable  Type 2 diabetes mellitus with vascular disease ([place]) We have encouraged continued careful diet management in an effort to lose weight.  Mixed hyperlipidemia At the next  chance available we will order lipid panel If LDL above goal we will add Zetia  Chronic obstructive pulmonary disease, unspecified COPD type ([place]) Stop smoking 1 year ago, on 4 L nasal cannula Denies any recent COPD exacerbation  COVID-19 Education: The signs and symptoms of COVID-19 were discussed with the patient and how to seek care for testing (follow up with PCP or arrange E-visit).  The importance of social distancing was discussed today.  Patient Risk:   After full review of this patients clinical status, I feel that they are at least moderate risk at this time.  Time:   Today, I have spent 25 minutes with the patient with telehealth technology discussing diabetes control, anginal symptoms to watch for, COPD symptoms, goal weight and lipid panel numbers.     Medication Adjustments/Labs and Tests Ordered: Current medicines are reviewed at length with the patient today.  Concerns regarding medicines are outlined above.   Tests Ordered: No tests ordered   Medication Changes: No changes made   Disposition: Follow-up in 6 months   Signed, Ida Rogue, MD  11/14/2018 9:17 AM    [facility] [address], [place], [place] [address]

## 2018-11-11 NOTE — Telephone Encounter (Signed)
Please schedule Tony Watson a WebEx appointment with Dr. Lorelei Pont sometime this month.

## 2018-11-14 ENCOUNTER — Other Ambulatory Visit: Payer: Self-pay | Admitting: Family Medicine

## 2018-11-14 ENCOUNTER — Encounter: Payer: Self-pay | Admitting: Cardiovascular Disease

## 2018-11-14 ENCOUNTER — Other Ambulatory Visit: Payer: Self-pay

## 2018-11-14 ENCOUNTER — Telehealth (INDEPENDENT_AMBULATORY_CARE_PROVIDER_SITE_OTHER): Payer: Medicare HMO | Admitting: Cardiovascular Disease

## 2018-11-14 VITALS — BP 135/60 | HR 74 | Ht 71.0 in | Wt 186.2 lb

## 2018-11-14 DIAGNOSIS — I5023 Acute on chronic systolic (congestive) heart failure: Secondary | ICD-10-CM

## 2018-11-14 DIAGNOSIS — J449 Chronic obstructive pulmonary disease, unspecified: Secondary | ICD-10-CM | POA: Diagnosis not present

## 2018-11-14 DIAGNOSIS — I25118 Atherosclerotic heart disease of native coronary artery with other forms of angina pectoris: Secondary | ICD-10-CM | POA: Diagnosis not present

## 2018-11-14 DIAGNOSIS — J849 Interstitial pulmonary disease, unspecified: Secondary | ICD-10-CM

## 2018-11-14 DIAGNOSIS — E782 Mixed hyperlipidemia: Secondary | ICD-10-CM

## 2018-11-14 DIAGNOSIS — E1159 Type 2 diabetes mellitus with other circulatory complications: Secondary | ICD-10-CM

## 2018-11-14 DIAGNOSIS — I5022 Chronic systolic (congestive) heart failure: Secondary | ICD-10-CM | POA: Diagnosis not present

## 2018-11-14 MED ORDER — LOSARTAN POTASSIUM 25 MG PO TABS: 12.5000 mg | ORAL_TABLET | Freq: Every day | ORAL | 3 refills | Status: DC

## 2018-11-14 MED ORDER — ATORVASTATIN CALCIUM 40 MG PO TABS: 40.0000 mg | ORAL_TABLET | Freq: Every day | ORAL | 3 refills | Status: DC

## 2018-11-14 MED ORDER — SPIRONOLACTONE 25 MG PO TABS: 25.0000 mg | ORAL_TABLET | Freq: Every day | ORAL | 3 refills | Status: DC

## 2018-11-14 MED ORDER — CARVEDILOL 12.5 MG PO TABS: 12.5000 mg | ORAL_TABLET | Freq: Two times a day (BID) | ORAL | 3 refills | Status: DC

## 2018-11-14 NOTE — Patient Instructions (Addendum)
Medication Instructions:  Refills sent in to pharmacy.  If you need a refill on your cardiac medications before your next appointment, please call your pharmacy.    Lab work: No new labs needed   If you have labs (blood work) drawn today and your tests are completely normal, you will receive your results only by: Marland Kitchen MyChart Message (if you have MyChart) OR . A paper copy in the mail If you have any lab test that is abnormal or we need to change your treatment, we will call you to review the results.   Testing/Procedures: No new testing needed   Follow-Up: At [facility], you and your health needs are our priority.  As part of our continuing mission to provide you with exceptional heart care, we have created designated Provider Care Teams.  These Care Teams include your primary Cardiologist (physician) and Advanced Practice Providers (APPs -  Physician Assistants and Nurse Practitioners) who all work together to provide you with the care you need, when you need it.  . You will need a follow up appointment in 12 months .   Please call our office 2 months in advance to schedule this appointment.    . Providers on your designated Care Team:   . Murray Hodgkins, NP . Christell Faith, PA-C . Marrianne Mood, PA-C  Any Other Special Instructions Will Be Listed Below (If Applicable).  For educational health videos Log in to : [email or web address] Or : [email or web address], password : [email or web address]

## 2018-11-14 NOTE — Telephone Encounter (Signed)
webex appointment 4/8 Pt aware

## 2018-11-15 NOTE — Progress Notes (Signed)
Tony Acree T. Tony Gamel, MD [facility] and [facility] [place], [address] Phone: [phone]  FAX: [place] - 68 y.o. male  MRN [id number]  Date of Birth: 12/17/1950  Visit Date: 11/16/2018  PCP: Owens Loffler, MD  Referred by: Owens Loffler, MD  Virtual Visit via Video Note:  I connected with  Tony Watson on 11/16/2018 10:40 AM EDT by a video enabled telemedicine application and verified that I am speaking with the correct person using two identifiers.   Location patient: home phone or cell phone Location provider: work or home office Consent: Verbal consent directly obtained from Tony Watson. Persons participating in the virtual visit: patient, provider  I discussed the limitations of evaluation and management by telemedicine and the availability of in person appointments. The patient expressed understanding and agreed to proceed.  Interactive audio and video telecommunications were attempted between this provider and patient, however failed, due to patient having technical difficulties OR patient did not have access to video capability.  We continued and completed visit with audio only.    History of Present Illness:  Well-known patient with chronic respiratory failure on oxygen, severe COPD, h/o significant CHF, CML, protein-calorie malnutrion who presents for telemedicine visit as I have not seen him in the office in about 6 months.   His breathing is going fairly well, and he feels like the nebulizer treatments that he has changed to has effectively saved his life.  He is still on oxygen 24 hours a day and that is going well with no difficulties in obtaining the oxygen.  He is also on prednisone 10 mg daily, and since he is done this he has had notable decrease in hospitalizations as well as ER visits, combined with nebulized medication.  Overall he is generally doing  well, and he and his wife think that he is in a relatively stable state particularly regarding his lungs.  Recent webex visit with Cards  186  bs machine Lancets strips  Review of Systems as above: See pertinent positives and pertinent negatives per HPI No acute distress verbally  Past Medical History, Surgical History, Social History, Family History, Problem List, Medications, and Allergies have been reviewed and updated if relevant.   Observations/Objective/Exam:  An attempt was made to discern vital signs over the phone and per patient if applicable and possible.   General:    Alert, Oriented, appears well and in no acute distress HEENT:     Atraumatic, conjunctiva clear, no obvious abnormalities on inspection of external nose and ears.  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   Unstable diabetes on chronic prednisone, I am going to help the patient get a Accu-Chek meter, lancets, and strips.  His weight has improved to 186 pounds.  I discussed the assessment and treatment plan with the patient. The patient was provided an opportunity to ask questions and all were answered. The patient agreed with the plan and demonstrated an understanding of the instructions.   The patient was advised to call back or seek an in-person evaluation if the symptoms worsen or if the condition fails to improve as anticipated.  Follow-up: 6 mo  Meds ordered this encounter  Medications  . ondansetron (ZOFRAN ODT) 4 MG disintegrating tablet    Sig: Take 1-2 tablets (4-8 mg total) by mouth every 6 (six) hours as needed for nausea or vomiting.    Dispense:  90 tablet    Refill:  3  . DISCONTD: blood glucose meter kit and supplies    Sig: Dispense based on patient and insurance preference. Use to check blood sugar daily.    Dispense:  1 each    Refill:  0    Order Specific Question:   Number of strips    Answer:   100    Order Specific Question:   Number of lancets    Answer:   100  . blood glucose meter kit and supplies    Sig: Dispense based on patient and insurance preference. Use to check blood sugar daily.    Dispense:  1 each    Refill:  0    Order Specific Question:   Number of strips    Answer:   100    Order Specific Question:   Number of lancets    Answer:   100   No orders of the defined types were placed in this encounter.   Signed,  Maud Deed. Mychelle Kendra, MD

## 2018-11-16 ENCOUNTER — Ambulatory Visit (INDEPENDENT_AMBULATORY_CARE_PROVIDER_SITE_OTHER): Payer: Medicare HMO | Admitting: Family Medicine

## 2018-11-16 ENCOUNTER — Encounter: Payer: Self-pay | Admitting: Family Medicine

## 2018-11-16 VITALS — BP 134/72 | HR 74 | Temp 97.7°F | Ht 70.0 in | Wt 186.2 lb

## 2018-11-16 DIAGNOSIS — I5022 Chronic systolic (congestive) heart failure: Secondary | ICD-10-CM | POA: Diagnosis not present

## 2018-11-16 DIAGNOSIS — J449 Chronic obstructive pulmonary disease, unspecified: Secondary | ICD-10-CM

## 2018-11-16 DIAGNOSIS — J9611 Chronic respiratory failure with hypoxia: Secondary | ICD-10-CM

## 2018-11-16 DIAGNOSIS — E43 Unspecified severe protein-calorie malnutrition: Secondary | ICD-10-CM | POA: Diagnosis not present

## 2018-11-16 DIAGNOSIS — C9310 Chronic myelomonocytic leukemia not having achieved remission: Secondary | ICD-10-CM

## 2018-11-16 DIAGNOSIS — E1159 Type 2 diabetes mellitus with other circulatory complications: Secondary | ICD-10-CM

## 2018-11-16 DIAGNOSIS — J849 Interstitial pulmonary disease, unspecified: Secondary | ICD-10-CM

## 2018-11-16 MED ORDER — BLOOD GLUCOSE METER KIT: PACK | 0 refills | Status: DC

## 2018-11-16 MED ORDER — ONDANSETRON 4 MG PO TBDP: 4.0000 mg | ORAL_TABLET | Freq: Four times a day (QID) | ORAL | 3 refills | Status: DC | PRN

## 2018-11-23 ENCOUNTER — Emergency Department: Payer: Medicare HMO

## 2018-11-23 ENCOUNTER — Telehealth: Payer: Self-pay | Admitting: Radiology

## 2018-11-23 ENCOUNTER — Other Ambulatory Visit: Payer: Self-pay

## 2018-11-23 ENCOUNTER — Encounter
Admission: EM | Disposition: A | Discharge status: Home / Self Care | Payer: Self-pay | Source: Home / Self Care | Attending: Internal Medicine

## 2018-11-23 ENCOUNTER — Inpatient Hospital Stay
Admission: EM | Admit: 2018-11-23 | Discharge: 2018-11-28 | DRG: 871 | Disposition: A | Discharge status: Home / Self Care | Payer: Medicare HMO | Attending: Internal Medicine | Admitting: Internal Medicine

## 2018-11-23 DIAGNOSIS — I255 Ischemic cardiomyopathy: Secondary | ICD-10-CM | POA: Diagnosis present

## 2018-11-23 DIAGNOSIS — Z79899 Other long term (current) drug therapy: Secondary | ICD-10-CM

## 2018-11-23 DIAGNOSIS — R0602 Shortness of breath: Secondary | ICD-10-CM

## 2018-11-23 DIAGNOSIS — K219 Gastro-esophageal reflux disease without esophagitis: Secondary | ICD-10-CM | POA: Diagnosis present

## 2018-11-23 DIAGNOSIS — Z20828 Contact with and (suspected) exposure to other viral communicable diseases: Secondary | ICD-10-CM | POA: Diagnosis present

## 2018-11-23 DIAGNOSIS — J441 Chronic obstructive pulmonary disease with (acute) exacerbation: Secondary | ICD-10-CM | POA: Diagnosis present

## 2018-11-23 DIAGNOSIS — Z8249 Family history of ischemic heart disease and other diseases of the circulatory system: Secondary | ICD-10-CM

## 2018-11-23 DIAGNOSIS — Z87891 Personal history of nicotine dependence: Secondary | ICD-10-CM

## 2018-11-23 DIAGNOSIS — K5730 Diverticulosis of large intestine without perforation or abscess without bleeding: Secondary | ICD-10-CM | POA: Diagnosis present

## 2018-11-23 DIAGNOSIS — J181 Lobar pneumonia, unspecified organism: Secondary | ICD-10-CM

## 2018-11-23 DIAGNOSIS — R7301 Impaired fasting glucose: Secondary | ICD-10-CM | POA: Diagnosis present

## 2018-11-23 DIAGNOSIS — I5042 Chronic combined systolic (congestive) and diastolic (congestive) heart failure: Secondary | ICD-10-CM | POA: Diagnosis present

## 2018-11-23 DIAGNOSIS — Z66 Do not resuscitate: Secondary | ICD-10-CM | POA: Diagnosis present

## 2018-11-23 DIAGNOSIS — E785 Hyperlipidemia, unspecified: Secondary | ICD-10-CM | POA: Diagnosis present

## 2018-11-23 DIAGNOSIS — R000 Tachycardia, unspecified: Secondary | ICD-10-CM | POA: Diagnosis present

## 2018-11-23 DIAGNOSIS — Z9841 Cataract extraction status, right eye: Secondary | ICD-10-CM

## 2018-11-23 DIAGNOSIS — D638 Anemia in other chronic diseases classified elsewhere: Secondary | ICD-10-CM | POA: Diagnosis present

## 2018-11-23 DIAGNOSIS — J8482 Adult pulmonary Langerhans cell histiocytosis: Secondary | ICD-10-CM | POA: Diagnosis present

## 2018-11-23 DIAGNOSIS — I110 Hypertensive heart disease with heart failure: Secondary | ICD-10-CM | POA: Diagnosis present

## 2018-11-23 DIAGNOSIS — I2510 Atherosclerotic heart disease of native coronary artery without angina pectoris: Secondary | ICD-10-CM | POA: Diagnosis present

## 2018-11-23 DIAGNOSIS — Z8673 Personal history of transient ischemic attack (TIA), and cerebral infarction without residual deficits: Secondary | ICD-10-CM

## 2018-11-23 DIAGNOSIS — Z452 Encounter for adjustment and management of vascular access device: Secondary | ICD-10-CM

## 2018-11-23 DIAGNOSIS — J9600 Acute respiratory failure, unspecified whether with hypoxia or hypercapnia: Secondary | ICD-10-CM | POA: Diagnosis present

## 2018-11-23 DIAGNOSIS — A419 Sepsis, unspecified organism: Secondary | ICD-10-CM | POA: Diagnosis present

## 2018-11-23 DIAGNOSIS — Z955 Presence of coronary angioplasty implant and graft: Secondary | ICD-10-CM

## 2018-11-23 DIAGNOSIS — M48061 Spinal stenosis, lumbar region without neurogenic claudication: Secondary | ICD-10-CM | POA: Diagnosis present

## 2018-11-23 DIAGNOSIS — Z856 Personal history of leukemia: Secondary | ICD-10-CM

## 2018-11-23 DIAGNOSIS — Z538 Procedure and treatment not carried out for other reasons: Secondary | ICD-10-CM | POA: Diagnosis present

## 2018-11-23 DIAGNOSIS — I248 Other forms of acute ischemic heart disease: Secondary | ICD-10-CM | POA: Diagnosis present

## 2018-11-23 DIAGNOSIS — J189 Pneumonia, unspecified organism: Secondary | ICD-10-CM | POA: Diagnosis present

## 2018-11-23 DIAGNOSIS — J9621 Acute and chronic respiratory failure with hypoxia: Secondary | ICD-10-CM | POA: Diagnosis present

## 2018-11-23 DIAGNOSIS — Z9981 Dependence on supplemental oxygen: Secondary | ICD-10-CM

## 2018-11-23 DIAGNOSIS — N179 Acute kidney failure, unspecified: Secondary | ICD-10-CM | POA: Diagnosis present

## 2018-11-23 DIAGNOSIS — Z7951 Long term (current) use of inhaled steroids: Secondary | ICD-10-CM

## 2018-11-23 DIAGNOSIS — J9601 Acute respiratory failure with hypoxia: Secondary | ICD-10-CM | POA: Diagnosis not present

## 2018-11-23 DIAGNOSIS — Z961 Presence of intraocular lens: Secondary | ICD-10-CM | POA: Diagnosis present

## 2018-11-23 DIAGNOSIS — Z7982 Long term (current) use of aspirin: Secondary | ICD-10-CM

## 2018-11-23 DIAGNOSIS — I959 Hypotension, unspecified: Secondary | ICD-10-CM | POA: Diagnosis present

## 2018-11-23 DIAGNOSIS — Z9842 Cataract extraction status, left eye: Secondary | ICD-10-CM

## 2018-11-23 DIAGNOSIS — R6510 Systemic inflammatory response syndrome (SIRS) of non-infectious origin without acute organ dysfunction: Secondary | ICD-10-CM

## 2018-11-23 HISTORY — DX: Type 2 diabetes mellitus without complications: E11.9

## 2018-11-23 LAB — BLOOD GAS, VENOUS
Acid-Base Excess: 7 mmol/L — ABNORMAL HIGH (ref 0.0–2.0)
Bicarbonate: 33.4 mmol/L — ABNORMAL HIGH (ref 20.0–28.0)
FIO2: 0.21
O2 Saturation: 88.2 %
Patient temperature: 37
pCO2, Ven: 54 mmHg (ref 44.0–60.0)
pH, Ven: 7.4 (ref 7.250–7.430)
pO2, Ven: 55 mmHg — ABNORMAL HIGH (ref 32.0–45.0)

## 2018-11-23 LAB — CBC
HCT: 37.1 % — ABNORMAL LOW (ref 39.0–52.0)
Hemoglobin: 12.2 g/dL — ABNORMAL LOW (ref 13.0–17.0)
MCH: 34 pg (ref 26.0–34.0)
MCHC: 32.9 g/dL (ref 30.0–36.0)
MCV: 103.3 fL — ABNORMAL HIGH (ref 80.0–100.0)
Platelets: 200 10*3/uL (ref 150–400)
RBC: 3.59 MIL/uL — ABNORMAL LOW (ref 4.22–5.81)
RDW: 15.4 % (ref 11.5–15.5)
WBC: 26.9 10*3/uL — ABNORMAL HIGH (ref 4.0–10.5)
nRBC: 0.1 % (ref 0.0–0.2)

## 2018-11-23 LAB — URINALYSIS, COMPLETE (UACMP) WITH MICROSCOPIC
Bacteria, UA: NONE SEEN
Bilirubin Urine: NEGATIVE
Glucose, UA: 50 mg/dL — AB
Ketones, ur: NEGATIVE mg/dL
Leukocytes,Ua: NEGATIVE
Nitrite: NEGATIVE
Protein, ur: NEGATIVE mg/dL
Specific Gravity, Urine: 1.02 (ref 1.005–1.030)
pH: 5 (ref 5.0–8.0)

## 2018-11-23 LAB — LACTIC ACID, PLASMA
Lactic Acid, Venous: 3 mmol/L (ref 0.5–1.9)
Lactic Acid, Venous: 3.6 mmol/L (ref 0.5–1.9)
Lactic Acid, Venous: 3.8 mmol/L (ref 0.5–1.9)
Lactic Acid, Venous: 4.5 mmol/L (ref 0.5–1.9)

## 2018-11-23 LAB — COMPREHENSIVE METABOLIC PANEL WITH GFR
ALT: 39 U/L (ref 0–44)
AST: 29 U/L (ref 15–41)
Albumin: 4 g/dL (ref 3.5–5.0)
Alkaline Phosphatase: 47 U/L (ref 38–126)
Anion gap: 15 (ref 5–15)
BUN: 17 mg/dL (ref 8–23)
CO2: 31 mmol/L (ref 22–32)
Calcium: 9.1 mg/dL (ref 8.9–10.3)
Chloride: 91 mmol/L — ABNORMAL LOW (ref 98–111)
Creatinine, Ser: 0.89 mg/dL (ref 0.61–1.24)
GFR calc Af Amer: >60 mL/min
GFR calc non Af Amer: >60 mL/min
Glucose, Bld: 240 mg/dL — ABNORMAL HIGH (ref 70–99)
Potassium: 3.4 mmol/L — ABNORMAL LOW (ref 3.5–5.1)
Sodium: 137 mmol/L (ref 135–145)
Total Bilirubin: 1 mg/dL (ref 0.3–1.2)
Total Protein: 8.1 g/dL (ref 6.5–8.1)

## 2018-11-23 LAB — GLUCOSE, CAPILLARY
Glucose-Capillary: 141 mg/dL — ABNORMAL HIGH (ref 70–99)
Glucose-Capillary: 137 mg/dL — ABNORMAL HIGH (ref 70–99)
Glucose-Capillary: 126 mg/dL — ABNORMAL HIGH (ref 70–99)

## 2018-11-23 LAB — MRSA PCR SCREENING: MRSA by PCR: NEGATIVE

## 2018-11-23 LAB — TROPONIN I: Troponin I: 0.03 ng/mL

## 2018-11-23 LAB — PROCALCITONIN: Procalcitonin: <0.1 ng/mL

## 2018-11-23 LAB — SARS CORONAVIRUS 2 BY RT PCR (HOSPITAL ORDER, PERFORMED IN ~~LOC~~ HOSPITAL LAB): SARS Coronavirus 2: NEGATIVE

## 2018-11-23 LAB — BRAIN NATRIURETIC PEPTIDE: B Natriuretic Peptide: 101 pg/mL — ABNORMAL HIGH (ref 0.0–100.0)

## 2018-11-23 SURGERY — CORONARY/GRAFT ACUTE MI REVASCULARIZATION
Anesthesia: Moderate Sedation

## 2018-11-23 MED ORDER — ASPIRIN 81 MG PO CHEW
81.0000 mg | CHEWABLE_TABLET | Freq: Every day | ORAL | Status: DC
  Administered 2018-11-23 – 2018-11-28 (×6): 81 mg via ORAL
  Filled 2018-11-23 (×6): qty 1

## 2018-11-23 MED ORDER — LORAZEPAM 0.5 MG PO TABS
0.5000 mg | ORAL_TABLET | Freq: Three times a day (TID) | ORAL | Status: DC | PRN
  Administered 2018-11-23 – 2018-11-28 (×3): 0.5 mg via ORAL
  Filled 2018-11-23 (×3): qty 1

## 2018-11-23 MED ORDER — VITAMIN D 50 MCG (2000 UT) PO CAPS: 2000.0000 [IU] | ORAL_CAPSULE | Freq: Every evening | ORAL | Status: DC

## 2018-11-23 MED ORDER — GUAIFENESIN ER 600 MG PO TB12
600.0000 mg | ORAL_TABLET | Freq: Two times a day (BID) | ORAL | Status: DC
  Administered 2018-11-23 – 2018-11-28 (×10): 600 mg via ORAL
  Filled 2018-11-23 (×10): qty 1

## 2018-11-23 MED ORDER — METHYLPREDNISOLONE SODIUM SUCC 40 MG IJ SOLR
40.0000 mg | Freq: Four times a day (QID) | INTRAMUSCULAR | Status: DC
  Administered 2018-11-24 (×2): 40 mg via INTRAVENOUS
  Filled 2018-11-23 (×2): qty 1

## 2018-11-23 MED ORDER — NYSTATIN 100000 UNIT/ML MT SUSP
5.0000 mL | Freq: Four times a day (QID) | OROMUCOSAL | Status: DC
  Administered 2018-11-23 – 2018-11-28 (×18): 500000 [IU] via OROMUCOSAL
  Filled 2018-11-23 (×23): qty 5

## 2018-11-23 MED ORDER — METHYLPREDNISOLONE SODIUM SUCC 125 MG IJ SOLR
80.0000 mg | Freq: Once | INTRAMUSCULAR | Status: AC
  Administered 2018-11-23: 21:00:00 80 mg via INTRAVENOUS
  Filled 2018-11-23: qty 2

## 2018-11-23 MED ORDER — SODIUM CHLORIDE 0.9 % IV BOLUS
500.0000 mL | Freq: Once | INTRAVENOUS | Status: AC
  Administered 2018-11-23: 500 mL via INTRAVENOUS

## 2018-11-23 MED ORDER — SODIUM CHLORIDE 0.9 % IV SOLN
INTRAVENOUS | Status: DC
  Administered 2018-11-23 – 2018-11-24 (×2): via INTRAVENOUS

## 2018-11-23 MED ORDER — SODIUM CHLORIDE 0.9 % IV SOLN
INTRAVENOUS | Status: DC | PRN
  Administered 2018-11-23 – 2018-11-27 (×4): 250 mL via INTRAVENOUS
  Administered 2018-11-28: 03:00:00 50 mL via INTRAVENOUS

## 2018-11-23 MED ORDER — ONDANSETRON HCL 4 MG/2ML IJ SOLN: 4.0000 mg | Freq: Four times a day (QID) | INTRAMUSCULAR | Status: DC | PRN

## 2018-11-23 MED ORDER — LORAZEPAM 2 MG/ML IJ SOLN
INTRAMUSCULAR | Status: AC
  Filled 2018-11-23: qty 1

## 2018-11-23 MED ORDER — LORAZEPAM 2 MG/ML IJ SOLN
1.0000 mg | Freq: Once | INTRAMUSCULAR | Status: AC
  Administered 2018-11-23: 1 mg via INTRAVENOUS

## 2018-11-23 MED ORDER — VITAMIN C 500 MG PO TABS
500.0000 mg | ORAL_TABLET | Freq: Every day | ORAL | Status: DC
  Administered 2018-11-23 – 2018-11-28 (×6): 500 mg via ORAL
  Filled 2018-11-23 (×6): qty 1

## 2018-11-23 MED ORDER — INSULIN ASPART 100 UNIT/ML ~~LOC~~ SOLN
0.0000 [IU] | Freq: Three times a day (TID) | SUBCUTANEOUS | Status: DC
  Administered 2018-11-24 (×2): 5 [IU] via SUBCUTANEOUS
  Administered 2018-11-24: 3 [IU] via SUBCUTANEOUS
  Administered 2018-11-25: 18:00:00 8 [IU] via SUBCUTANEOUS
  Administered 2018-11-25: 11 [IU] via SUBCUTANEOUS
  Administered 2018-11-25 – 2018-11-26 (×3): 8 [IU] via SUBCUTANEOUS
  Administered 2018-11-26: 17:00:00 11 [IU] via SUBCUTANEOUS
  Administered 2018-11-27 (×3): 8 [IU] via SUBCUTANEOUS
  Administered 2018-11-28: 11 [IU] via SUBCUTANEOUS
  Administered 2018-11-28: 08:00:00 8 [IU] via SUBCUTANEOUS
  Filled 2018-11-23 (×13): qty 1

## 2018-11-23 MED ORDER — IPRATROPIUM-ALBUTEROL 0.5-2.5 (3) MG/3ML IN SOLN
3.0000 mL | Freq: Four times a day (QID) | RESPIRATORY_TRACT | Status: DC
  Administered 2018-11-23 – 2018-11-25 (×9): 3 mL via RESPIRATORY_TRACT
  Filled 2018-11-23 (×10): qty 3

## 2018-11-23 MED ORDER — GABAPENTIN 300 MG PO CAPS
300.0000 mg | ORAL_CAPSULE | Freq: Every day | ORAL | Status: DC
  Administered 2018-11-23 – 2018-11-27 (×5): 300 mg via ORAL
  Filled 2018-11-23 (×5): qty 1

## 2018-11-23 MED ORDER — ONDANSETRON HCL 4 MG PO TABS: 4.0000 mg | ORAL_TABLET | Freq: Four times a day (QID) | ORAL | Status: DC | PRN

## 2018-11-23 MED ORDER — VANCOMYCIN HCL 10 G IV SOLR
1250.0000 mg | Freq: Two times a day (BID) | INTRAVENOUS | Status: DC
  Filled 2018-11-23 (×2): qty 1250

## 2018-11-23 MED ORDER — VANCOMYCIN HCL IN DEXTROSE 1-5 GM/200ML-% IV SOLN
1000.0000 mg | Freq: Once | INTRAVENOUS | Status: AC
  Administered 2018-11-23: 10:00:00 1000 mg via INTRAVENOUS
  Filled 2018-11-23: qty 200

## 2018-11-23 MED ORDER — LABETALOL HCL 5 MG/ML IV SOLN: 10.0000 mg | Freq: Once | INTRAVENOUS | Status: DC

## 2018-11-23 MED ORDER — PIPERACILLIN-TAZOBACTAM 3.375 G IVPB 30 MIN
3.3750 g | Freq: Once | INTRAVENOUS | Status: AC
  Administered 2018-11-23: 3.375 g via INTRAVENOUS
  Filled 2018-11-23 (×2): qty 50

## 2018-11-23 MED ORDER — ACETAMINOPHEN 325 MG PO TABS: 650.0000 mg | ORAL_TABLET | Freq: Four times a day (QID) | ORAL | Status: DC | PRN

## 2018-11-23 MED ORDER — LORAZEPAM 2 MG/ML IJ SOLN
1.0000 mg | Freq: Once | INTRAMUSCULAR | Status: AC
  Administered 2018-11-23: 1 mg via INTRAVENOUS
  Filled 2018-11-23: qty 1

## 2018-11-23 MED ORDER — CARVEDILOL 12.5 MG PO TABS
12.5000 mg | ORAL_TABLET | Freq: Two times a day (BID) | ORAL | Status: DC
  Filled 2018-11-23 (×2): qty 1

## 2018-11-23 MED ORDER — ADULT MULTIVITAMIN W/MINERALS CH
1.0000 | ORAL_TABLET | Freq: Every day | ORAL | Status: DC
  Administered 2018-11-23 – 2018-11-28 (×6): 1 via ORAL
  Filled 2018-11-23 (×7): qty 1

## 2018-11-23 MED ORDER — SENNOSIDES-DOCUSATE SODIUM 8.6-50 MG PO TABS: 1.0000 | ORAL_TABLET | Freq: Every evening | ORAL | Status: DC | PRN

## 2018-11-23 MED ORDER — ATORVASTATIN CALCIUM 20 MG PO TABS
40.0000 mg | ORAL_TABLET | Freq: Every day | ORAL | Status: DC
  Administered 2018-11-23 – 2018-11-27 (×5): 40 mg via ORAL
  Filled 2018-11-23 (×5): qty 2

## 2018-11-23 MED ORDER — ENOXAPARIN SODIUM 40 MG/0.4ML ~~LOC~~ SOLN
40.0000 mg | SUBCUTANEOUS | Status: DC
  Administered 2018-11-23 – 2018-11-27 (×5): 40 mg via SUBCUTANEOUS
  Filled 2018-11-23 (×5): qty 0.4

## 2018-11-23 MED ORDER — IPRATROPIUM-ALBUTEROL 0.5-2.5 (3) MG/3ML IN SOLN
3.0000 mL | RESPIRATORY_TRACT | Status: DC | PRN
  Administered 2018-11-24 – 2018-11-28 (×4): 3 mL via RESPIRATORY_TRACT
  Filled 2018-11-23 (×4): qty 3

## 2018-11-23 MED ORDER — ACETAMINOPHEN 650 MG RE SUPP: 650.0000 mg | Freq: Four times a day (QID) | RECTAL | Status: DC | PRN

## 2018-11-23 MED ORDER — IOPAMIDOL (ISOVUE-370) INJECTION 76%
100.0000 mL | Freq: Once | INTRAVENOUS | Status: AC | PRN
  Administered 2018-11-23: 100 mL via INTRAVENOUS

## 2018-11-23 MED ORDER — FUROSEMIDE 10 MG/ML IJ SOLN
80.0000 mg | Freq: Once | INTRAMUSCULAR | Status: AC
  Administered 2018-11-23: 80 mg via INTRAVENOUS
  Filled 2018-11-23: qty 8

## 2018-11-23 MED ORDER — LOSARTAN POTASSIUM 25 MG PO TABS
12.5000 mg | ORAL_TABLET | Freq: Every day | ORAL | Status: DC
  Administered 2018-11-23 – 2018-11-24 (×2): 12.5 mg via ORAL
  Filled 2018-11-23 (×2): qty 1

## 2018-11-23 MED ORDER — SODIUM CHLORIDE 0.9 % IV BOLUS
250.0000 mL | Freq: Once | INTRAVENOUS | Status: AC
  Administered 2018-11-23: 250 mL via INTRAVENOUS

## 2018-11-23 MED ORDER — VITAMIN D 25 MCG (1000 UNIT) PO TABS
2000.0000 [IU] | ORAL_TABLET | Freq: Every day | ORAL | Status: DC
  Administered 2018-11-23 – 2018-11-27 (×5): 2000 [IU] via ORAL
  Filled 2018-11-23 (×5): qty 2

## 2018-11-23 MED ORDER — INSULIN ASPART 100 UNIT/ML ~~LOC~~ SOLN
0.0000 [IU] | Freq: Every day | SUBCUTANEOUS | Status: DC
  Administered 2018-11-24 – 2018-11-27 (×4): 3 [IU] via SUBCUTANEOUS
  Filled 2018-11-23 (×4): qty 1

## 2018-11-23 MED ORDER — NITROGLYCERIN 2 % TD OINT
1.0000 [in_us] | TOPICAL_OINTMENT | Freq: Once | TRANSDERMAL | Status: AC
  Administered 2018-11-23: 1 [in_us] via TOPICAL
  Filled 2018-11-23: qty 1

## 2018-11-23 MED ORDER — BUDESONIDE 0.5 MG/2ML IN SUSP
0.5000 mg | Freq: Two times a day (BID) | RESPIRATORY_TRACT | Status: DC
  Administered 2018-11-23 – 2018-11-28 (×10): 0.5 mg via RESPIRATORY_TRACT
  Filled 2018-11-23 (×10): qty 2

## 2018-11-23 MED ORDER — POTASSIUM CHLORIDE CRYS ER 20 MEQ PO TBCR
40.0000 meq | EXTENDED_RELEASE_TABLET | Freq: Once | ORAL | Status: AC
  Administered 2018-11-23: 40 meq via ORAL
  Filled 2018-11-23: qty 2

## 2018-11-23 MED ORDER — SODIUM CHLORIDE 0.9 % IV SOLN
2.0000 g | Freq: Three times a day (TID) | INTRAVENOUS | Status: DC
  Administered 2018-11-23 – 2018-11-24 (×3): 2 g via INTRAVENOUS
  Filled 2018-11-23 (×5): qty 2

## 2018-11-23 MED ORDER — TIZANIDINE HCL 4 MG PO TABS
4.0000 mg | ORAL_TABLET | Freq: Every evening | ORAL | Status: DC
  Administered 2018-11-23 – 2018-11-27 (×5): 4 mg via ORAL
  Filled 2018-11-23 (×5): qty 1

## 2018-11-23 MED ORDER — PANTOPRAZOLE SODIUM 40 MG PO TBEC
40.0000 mg | DELAYED_RELEASE_TABLET | Freq: Every day | ORAL | Status: DC
  Administered 2018-11-23 – 2018-11-28 (×6): 40 mg via ORAL
  Filled 2018-11-23 (×7): qty 1

## 2018-11-23 MED ORDER — LORATADINE 10 MG PO TABS
10.0000 mg | ORAL_TABLET | Freq: Every day | ORAL | Status: DC
  Administered 2018-11-23 – 2018-11-28 (×6): 10 mg via ORAL
  Filled 2018-11-23 (×7): qty 1

## 2018-11-23 MED ORDER — DILTIAZEM HCL 25 MG/5ML IV SOLN
20.0000 mg | Freq: Once | INTRAVENOUS | Status: AC
  Administered 2018-11-23: 20 mg via INTRAVENOUS
  Filled 2018-11-23: qty 5

## 2018-11-23 NOTE — ED Notes (Signed)
Pt is calm and cooperative at this time. This RN will continue to monitor pt.

## 2018-11-23 NOTE — ED Notes (Signed)
MD Pyreddy at bedside to provide update to pt.

## 2018-11-23 NOTE — ED Notes (Signed)
Date and time results received: 11/23/18  1346  (use smartphrase ".now" to insert current time)  Test: lactic acid  Critical Value: 3.6  Name of Provider Notified: Jonathon Jordan MD   Orders Received? Or Actions Taken?: Actions Taken: MD Jonathon Jordan aware of critical result

## 2018-11-23 NOTE — H&P (Signed)
Tony Watson NAME: Sten Dematteo    MR#:  [id number]  DATE OF BIRTH:  1951-04-04  DATE OF ADMISSION:  11/23/2018  PRIMARY CARE PHYSICIAN: Owens Loffler, MD   REQUESTING/REFERRING PHYSICIAN:   CHIEF COMPLAINT:   Chief Complaint  Patient presents with  . Chest Pain    HISTORY OF PRESENT ILLNESS: Tony Watson  is a 68 y.o. male with a known history of coronary artery disease, arthritis, combined systolic diastolic heart failure, chronic respiratory failure on oxygen via nasal cannula at 2 L, chronic myelocytic leukemia, COPD, type 2 diabetes mellitus, diabetic neuropathy, GERD, hypertension, hyperlipidemia, interstitial lung disease presented to the emergency room for shortness of breath and mid back pain.  Patient was short of breath low on oxygen saturation.  He was put on oxygen via nasal cannula initially and then bumped up to oxygen via nonrebreather at 15 L with.  Also put on BiPAP in the emergency room but again transition back to nonrebreather mask.  Patient was worked up with CTA chest which showed pneumonia.  Started on broad-spectrum antibiotics lactic acid was elevated and code sepsis was called.  Coronavirus test was also sent and it came back negative ER physician check with the laboratory.  No history of any recent travel, sick contacts at home.  PAST MEDICAL HISTORY:   Past Medical History:  Diagnosis Date  . Arthritis   . CAD (coronary artery disease)    a. inf MI 11/99 (in [place]) w/ PCI to [facility]; b. [place] 2004 [facility]): EF 50%, patent RCA stent, no obs dz; c. MV 2008: EF 42%, inf infarct, no ischemia; d. R/LHC 9/19: 3-v dz w/ mild dif dz LM, m-dLAD 50, dLAD 60, D2 80, RI 80, mRCA-1 90, mRCA-2 20, patent RCA stent, CO/CI 3.7/2  . Chronic combined systolic (congestive) and diastolic (congestive) heart failure ([place])    a. 7/29019 Echo: EF 25-30%, sev diff HK, mild MR mod dil LA, mildly reduced RVSF, elevated CVP  .  Chronic respiratory failure with hypoxia (HCC)    a. on 2L supplemental oxygen via [place] followed by pulmonology  . CML (chronic myelocytic leukemia) ([place])   . COPD (chronic obstructive pulmonary disease) ([place])   . Diabetes mellitus without complication ([place])   . Diabetic autonomic neuropathy associated with type 2 diabetes mellitus ([place]) 03/27/2015  . Diverticulosis 2002  . GERD (gastroesophageal reflux disease)   . HTN (hypertension)   . Hyperlipidemia   . ILD (interstitial lung disease) ([place])   . Ischemic cardiomyopathy   . Medical non-compliance   . Smoker   . Spinal stenosis   . TIA (transient ischemic attack)    a. s/p PFO closure 2004 (in [place])  . Ulcer     PAST SURGICAL HISTORY:  Past Surgical History:  Procedure Laterality Date  . BACK SURGERY     patient denies-just lumbar punctures  . BRONCHOSCOPY    . CARDIAC CATHETERIZATION  11/99   CI per [facility]  . CATARACT EXTRACTION W/PHACO Right 11/24/2016   Procedure: CATARACT EXTRACTION PHACO AND INTRAOCULAR LENS PLACEMENT (IOC);  Surgeon: Birder Robson, MD;  Location: [facility] ORS;  Service: Ophthalmology;  Laterality: Right;  [place] 1:04.3 AP% 22.3 CDE 14.35 Fluid pack lot # [id number]  . CATARACT EXTRACTION W/PHACO Left 12/29/2016   Procedure: CATARACT EXTRACTION PHACO AND INTRAOCULAR LENS PLACEMENT (IOC);  Surgeon: Birder Robson, MD;  Location: [facility] ORS;  Service: Ophthalmology;  Laterality: Left;  [place] 00:52 AP% 18.5 CDE 9.67  Fluid pack lot # [id number]  . COLONOSCOPY    . COLONOSCOPY WITH PROPOFOL N/A 05/25/2017   Procedure: COLONOSCOPY WITH PROPOFOL;  Surgeon: Jonathon Bellows, MD;  Location: [facility] ENDOSCOPY;  Service: Gastroenterology;  Laterality: N/A;  . CORONARY ANGIOPLASTY     STENT  . CORONARY ARTERY BYPASS GRAFT     stent  . ESOPHAGOGASTRODUODENOSCOPY (EGD) WITH PROPOFOL N/A 05/25/2017   Procedure: ESOPHAGOGASTRODUODENOSCOPY (EGD) WITH PROPOFOL;  Surgeon: Jonathon Bellows, MD;  Location: [facility] ENDOSCOPY;  Service: Gastroenterology;   Laterality: N/A;  . EYE SURGERY    . FLEXIBLE BRONCHOSCOPY N/A 05/19/2017   Procedure: FLEXIBLE BRONCHOSCOPY;  Surgeon: Wilhelmina Mcardle, MD;  Location: [facility] ORS;  Service: Pulmonary;  Laterality: N/A;  . open heart surgery  2004   PFO repair  . RIGHT/LEFT HEART CATH AND CORONARY ANGIOGRAPHY N/A 05/03/2018   Procedure: RIGHT/LEFT HEART CATH AND CORONARY ANGIOGRAPHY;  Surgeon: Nelva Bush, MD;  Location: [facility] CV LAB;  Service: Cardiovascular;  Laterality: N/A;  . UPPER GI ENDOSCOPY      SOCIAL HISTORY:  Social History   Tobacco Use  . Smoking status: Former Smoker    Packs/day: 0.50    Years: 45.00    Pack years: 22.50    Types: Cigarettes    Last attempt to quit: 01/25/2018    Years since quitting: 0.8  . Smokeless tobacco: Never Used  . Tobacco comment: 1 ppd +40 years  Substance Use Topics  . Alcohol use: Not Currently    Alcohol/week: 0.0 standard drinks    Comment: weekly but last dose 1 month    FAMILY HISTORY:  Family History  Problem Relation Age of Onset  . Coronary artery disease Other        family hx  . Breast cancer Other        1st egree relative <50  . Cancer Mother   . Heart disease Father     DRUG ALLERGIES: No Known Allergies  REVIEW OF SYSTEMS:   CONSTITUTIONAL: No fever,has fatigue and weakness.  EYES: No blurred or double vision.  EARS, NOSE, AND THROAT: No tinnitus or ear pain.  RESPIRATORY: Has cough, shortness of breath,  No wheezing or hemoptysis.  CARDIOVASCULAR: No chest pain, orthopnea, edema.  GASTROINTESTINAL: No nausea, vomiting, diarrhea or abdominal pain.  GENITOURINARY: No dysuria, hematuria.  ENDOCRINE: No polyuria, nocturia,  HEMATOLOGY: No anemia, easy bruising or bleeding SKIN: No rash or lesion. MUSCULOSKELETAL: No joint pain or arthritis.   NEUROLOGIC: No tingling, numbness, weakness.  PSYCHIATRY: No anxiety or depression.   MEDICATIONS AT HOME:  Prior to Admission medications   Medication Sig Start Date  End Date Taking? Authorizing Provider  acetaminophen (TYLENOL) 500 MG tablet Take 1,000 mg by mouth daily as needed for moderate pain or headache.   Yes [provider]  aspirin 81 MG chewable tablet Chew 1 tablet (81 mg total) by mouth daily. 05/09/18  Yes Wieting, Richard, MD  atorvastatin (LIPITOR) 40 MG tablet Take 1 tablet (40 mg total) by mouth daily at 6 PM. Patient taking differently: Take 40 mg by mouth at bedtime.  11/14/18  Yes Gollan, Kathlene November, MD  budesonide (PULMICORT) 0.5 MG/2ML nebulizer solution Take 2 mLs (0.5 mg total) by nebulization 2 (two) times daily. 03/06/18 03/06/19 Yes Vaughan Basta, MD  carvedilol (COREG) 12.5 MG tablet Take 1 tablet (12.5 mg total) by mouth 2 (two) times daily. 11/14/18 02/12/19 Yes Gollan, Kathlene November, MD  cetirizine (ZYRTEC) 10 MG chewable tablet Chew 10 mg by mouth  daily.   Yes [provider]  Cholecalciferol (VITAMIN D) 2000 units CAPS Take 2,000 Units by mouth every evening.   Yes [provider]  formoterol (PERFOROMIST) 20 MCG/2ML nebulizer solution Take 2 mLs (20 mcg total) by nebulization 2 (two) times daily. J44.9 06/10/18  Yes [name], MD  gabapentin (NEURONTIN) 300 MG capsule Take 1 capsule (300 mg total) by mouth at bedtime. 11/03/18  Yes Copland, Frederico Hamman, MD  ipratropium-albuterol (DUONEB) 0.5-2.5 (3) MG/3ML SOLN Take 3 mLs by nebulization every 4 (four) hours as needed. DX:J44.9 Patient taking differently: Take 3 mLs by nebulization every 4 (four) hours as needed (for shortness of breath/wheezing).  11/04/18  Yes [name], MD  ivabradine (CORLANOR) 5 MG TABS tablet Take 1 tablet (5 mg total) by mouth 2 (two) times daily with a meal. 08/08/18  Yes Hackney, Tina A, FNP  LORazepam (ATIVAN) 0.5 MG tablet Take 1 tablet (0.5 mg total) by mouth 3 (three) times daily as needed for anxiety. 11/11/18  Yes Copland, Frederico Hamman, MD  losartan (COZAAR) 25 MG tablet Take 0.5 tablets (12.5 mg total) by mouth daily. 11/14/18  Yes  Minna Merritts, MD  Multiple Vitamin (MULTIVITAMIN WITH MINERALS) TABS tablet Take 1 tablet by mouth daily. 03/07/18  Yes Vaughan Basta, MD  nystatin (MYCOSTATIN) 100000 UNIT/ML suspension TAKE AS DIRECTED 5 MLS (500,000 UNITS TOTAL) IN THE MOUTH OR THROAT 4 TIMES DAILY Patient taking differently: Use as directed 5 mLs in the mouth or throat 4 (four) times daily.  10/12/18  Yes Copland, Frederico Hamman, MD  omeprazole (PRILOSEC) 20 MG capsule Take 20 mg by mouth daily.   Yes [provider]  ondansetron (ZOFRAN ODT) 4 MG disintegrating tablet Take 1-2 tablets (4-8 mg total) by mouth every 6 (six) hours as needed for nausea or vomiting. 11/16/18  Yes Copland, Frederico Hamman, MD  predniSONE (DELTASONE) 10 MG tablet Take 1 tablet (10 mg total) by mouth daily with breakfast. 11/11/18  Yes Copland, Frederico Hamman, MD  spironolactone (ALDACTONE) 25 MG tablet Take 1 tablet (25 mg total) by mouth daily. 11/14/18  Yes Gollan, Kathlene November, MD  tiZANidine (ZANAFLEX) 4 MG tablet Take 1 tablet (4 mg total) by mouth Nightly. 03/23/18  Yes Copland, Frederico Hamman, MD  torsemide (DEMADEX) 20 MG tablet Take 3 tablets (60 mg total) by mouth daily. 10/03/18 01/01/19 Yes Hackney, Otila Kluver A, FNP  vitamin C (ASCORBIC ACID) 500 MG tablet Take 500 mg by mouth daily.   Yes [provider]  blood glucose meter kit and supplies Dispense based on patient and insurance preference. Use to check blood sugar daily. 11/16/18   Copland, Frederico Hamman, MD      PHYSICAL EXAMINATION:   VITAL SIGNS: Blood pressure 111/77, pulse (!) 128, temperature 99.9 F (37.7 C), temperature source Axillary, resp. rate (!) 25, height _0  (1.803 m), weight 88.5 kg, SpO2 97 %.  GENERAL:  68 y.o.-year-old patient lying in the bed with no acute distress.  EYES: Pupils equal, round, reactive to light and accommodation. No scleral icterus. Extraocular muscles intact.  HEENT: Head atraumatic, normocephalic. Oropharynx and nasopharynx clear.  NECK:  Supple, no jugular  venous distention. No thyroid enlargement, no tenderness.  LUNGS: Decreased breath sounds bilaterally, rales heard in left lung. On oxygen via nonrebreather mask at 15 L CARDIOVASCULAR: S1, S2 tachycardia present. No murmurs, rubs, or gallops.  ABDOMEN: Soft, nontender, nondistended. Bowel sounds present. No organomegaly or mass.  EXTREMITIES: Trace pedal edema,  No cyanosis, or clubbing.  NEUROLOGIC: Cranial nerves II through XII  are intact. Muscle strength 5/5 in all extremities. Sensation intact. Gait not checked.  PSYCHIATRIC: The patient is alert and oriented x 3.  SKIN: No obvious rash, lesion, or ulcer.   LABORATORY PANEL:   CBC Recent Labs  Lab 11/23/18 0735  WBC 26.9*  HGB 12.2*  HCT 37.1*  PLT 200  MCV 103.3*  MCH 34.0  MCHC 32.9  RDW 15.4   ------------------------------------------------------------------------------------------------------------------  Chemistries  Recent Labs  Lab 11/23/18 0735  NA 137  K 3.4*  CL 91*  CO2 31  GLUCOSE 240*  BUN 17  CREATININE 0.89  CALCIUM 9.1  AST 29  ALT 39  ALKPHOS 47  BILITOT 1.0   ------------------------------------------------------------------------------------------------------------------ estimated creatinine clearance is 87 mL/min (by C-G formula based on SCr of 0.89 mg/dL). ------------------------------------------------------------------------------------------------------------------ No results for input(s): TSH, T4TOTAL, T3FREE, THYROIDAB in the last 72 hours.  Invalid input(s): FREET3   Coagulation profile No results for input(s): INR, PROTIME in the last 168 hours. ------------------------------------------------------------------------------------------------------------------- No results for input(s): DDIMER in the last 72 hours. -------------------------------------------------------------------------------------------------------------------  Cardiac Enzymes Recent Labs  Lab  11/23/18 0735  TROPONINI 0.03*   ------------------------------------------------------------------------------------------------------------------ Invalid input(s): POCBNP  ---------------------------------------------------------------------------------------------------------------  Urinalysis    Component Value Date/Time   COLORURINE YELLOW (A) 11/23/2018 1047   APPEARANCEUR CLEAR (A) 11/23/2018 1047   APPEARANCEUR Clear 02/05/2013 1230   LABSPEC 1.020 11/23/2018 1047   LABSPEC 1.026 02/05/2013 1230   PHURINE 5.0 11/23/2018 1047   GLUCOSEU 50 (A) 11/23/2018 1047   GLUCOSEU Negative 02/05/2013 1230   HGBUR SMALL (A) 11/23/2018 1047   BILIRUBINUR NEGATIVE 11/23/2018 1047   BILIRUBINUR Negative 02/05/2013 1230   KETONESUR NEGATIVE 11/23/2018 1047   PROTEINUR NEGATIVE 11/23/2018 1047   NITRITE NEGATIVE 11/23/2018 1047   LEUKOCYTESUR NEGATIVE 11/23/2018 1047   LEUKOCYTESUR Negative 02/05/2013 1230     RADIOLOGY: Dg Chest Port 1 View  Result Date: 11/23/2018 CLINICAL DATA:  Shaking with low back pain and dry heaving. Shortness of breath. EXAM: PORTABLE CHEST 1 VIEW COMPARISON:  05/08/2018 and 05/01/2018 radiographs.  CT 05/13/2017. FINDINGS: 0734 hours. Two views obtained. The heart size and mediastinal contours are stable status post median sternotomy. There is stable chronic interstitial lung disease without evidence of superimposed edema, confluent airspace opacity or pleural effusion. There is no pneumothorax. Telemetry leads overlie the chest. 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 No evidence of acute superimposed process. Electronically Signed   By: Richardean Sale M.D.   On: 11/23/2018 08:23   Ct Angio Chest/abd/pel For Dissection W And/or Wo Contrast  Result Date: 11/23/2018 CLINICAL DATA:  Acute chest and upper back pain and shortness of breath. EXAM: CT ANGIOGRAPHY CHEST, ABDOMEN AND  PELVIS TECHNIQUE: Multidetector CT imaging through the chest, abdomen and pelvis was performed using the standard protocol during bolus administration of intravenous contrast. Multiplanar reconstructed images and MIPs were obtained and reviewed to evaluate the vascular anatomy. CONTRAST:  113m ISOVUE-370 IOPAMIDOL (ISOVUE-370) INJECTION 76% COMPARISON:  Chest x-ray dated 11/23/2018 and chest CT dated 05/13/2017 and CT scan of the abdomen and pelvis dated 02/24/2017 FINDINGS: CTA CHEST FINDINGS Cardiovascular: There is extensive coronary artery calcification. Minimal aortic atherosclerosis. Heart size is normal. No pericardial effusion. No evidence of thoracic aortic dissection. Mediastinum/Nodes: Chronic lobulation of the thyroid gland, unchanged. Trachea and esophagus are normal. No significant hilar or mediastinal adenopathy. Lungs/Pleura: There is a dense consolidative infiltrate in the posterolateral aspect of the left lower lobe. This is superimposed on chronic interstitial and  obstructive lung disease. No effusions. Musculoskeletal: Previous median sternotomy. No acute bone abnormality. Review of the MIP images confirms the above findings. CTA ABDOMEN AND PELVIS FINDINGS VASCULAR Aorta: Aortic atherosclerosis. Celiac: Widely patent. SMA: Widely patent. Renals: There are at least 4 widely patent right renal arteries. There is a single widely patent left renal artery. IMA: IMA is patent.  Dense calcification at the origin. Inflow: Patent without evidence of aneurysm, dissection, vasculitis or significant stenosis. Veins: No obvious venous abnormality within the limitations of this arterial phase study. Review of the MIP images confirms the above findings. NON-VASCULAR Hepatobiliary: No focal liver abnormality is seen. No gallstones, gallbladder wall thickening, or biliary dilatation. Pancreas: Unremarkable. No pancreatic ductal dilatation or surrounding inflammatory changes. Spleen: Normal in size without focal  abnormality. Adrenals/Urinary Tract: Adrenal glands are unremarkable. Kidneys are normal, without renal calculi, focal lesion, or hydronephrosis. Bladder is unremarkable. Stomach/Bowel: Stomach is within normal limits. Appendix appears normal. No evidence of bowel wall thickening, distention, or inflammatory changes. Scattered diverticula in the descending and sigmoid portions of the colon. Lymphatic: No adenopathy. Reproductive: Prostate is unremarkable. Other: No abdominal wall hernia or abnormality. No abdominopelvic ascites. Musculoskeletal: No acute abnormality. Severe spinal stenosis at L2-3, L3-4, and L4-5. Diffuse degenerative disc and joint disease in the lumbar spine. Review of the MIP images confirms the above findings. IMPRESSION: 1. Dense consolidative infiltrate in the posterolateral aspect of the left lower lobe consistent pneumonia. 2. No evidence of aortic dissection. 3.  Extensive aortic Atherosclerosis (ICD10-I70.0). 4. Colonic diverticulosis. 5. Multilevel severe lumbar spinal stenosis. Electronically Signed   By: Lorriane Shire M.D.   On: 11/23/2018 10:39    EKG: Orders placed or performed during the hospital encounter of 11/23/18  . ED EKG  . ED EKG  . EKG 12-Lead  . EKG 12-Lead  . ED EKG  . ED EKG    IMPRESSION AND PLAN: 69 year old male patient with a known history of coronary artery disease, arthritis, combined systolic diastolic heart failure, chronic respiratory failure on oxygen via nasal cannula at 2 L, chronic myelocytic leukemia, COPD, type 2 diabetes mellitus, diabetic neuropathy, GERD, hypertension, hyperlipidemia, interstitial lung disease presented to the emergency room for shortness of breath and mid back pain.   -Acute hypoxic respiratory failure Admit patient to stepdown unit Oxygen via nonrebreather mask at 15 L Intensivist consultation  -Sepsis Secondary to pneumonia Broad-spectrum IV antibiotics Follow-up cultures and lactic acid levels  -Left lung  pneumonia Start patient on broad-spectrum IV antibiotics that is vancomycin and cefepime Follow-up cultures  -COPD Nebulization therapy to continue  -Type 2 diabetes mellitus Diabetic diet with sliding scale coverage with insulin  All the records are reviewed and case discussed with ED provider. Management plans discussed with the patient, family and they are in agreement.  CODE STATUS: DNR Code Status History    Date Active Date Inactive Code Status Order ID Comments User Context   05/03/2018 1107 05/08/2018 1718 DNR [id number]  Nelva Bush, MD Inpatient   05/03/2018 0955 05/03/2018 1107 Full Code [id number]  Nelva Bush, MD Inpatient   05/02/2018 1609 05/03/2018 0955 DNR [id number]  Jimmy Footman, NP Inpatient   05/02/2018 1422 05/02/2018 1609 Full Code [id number]  Jimmy Footman, NP Inpatient   05/02/2018 1303 05/02/2018 1422 DNR [id number]  Jimmy Footman, NP Inpatient   05/01/2018 1254 05/02/2018 1303 Full Code [id number]  Nicholes Mango, MD Inpatient   03/24/2018 1948 03/28/2018 1340 Full Code [id number]  Saundra Shelling, MD Inpatient   03/03/2018  3794 03/06/2018 1716 Full Code [id number]  Harrie Foreman, MD Inpatient    Questions for Most Recent Historical Code Status (Order [id number])    Question Answer Comment   In the event of cardiac or respiratory ARREST Do not call a "code blue"    In the event of cardiac or respiratory ARREST Do not perform Intubation, CPR, defibrillation or ACLS    In the event of cardiac or respiratory ARREST Use medication by any route, position, wound care, and other measures to relive pain and suffering. May use oxygen, suction and manual treatment of airway obstruction as needed for comfort.         Advance Directive Documentation     Most Recent Value  Type of Advance Directive  Healthcare Power of Attorney  Pre-existing out of facility DNR order (yellow form or pink MOST form)  -  "MOST" Form in Place?  -        TOTAL CRITICAL CARE TIME TAKING CARE OF THIS PATIENT: 54 minutes.    Saundra Shelling M.D on 11/23/2018 at 1:15 PM  Between 7am to 6pm - Pager - [phone]  After 6pm go to [email or web address] - password [email or web address]  Office  [phone]  CC: Primary care physician; Owens Loffler, MD

## 2018-11-23 NOTE — ED Notes (Signed)
Pt daughter Andee Poles updated on pt condition - report given to [employer]

## 2018-11-23 NOTE — ED Notes (Signed)
Dr Jimmye Norman removed entire amount of nitro paste

## 2018-11-23 NOTE — ED Notes (Signed)
Awaiting 2nd person to transport - charge and Dr Jimmye Norman aware

## 2018-11-23 NOTE — Consult Note (Signed)
Name: Tony Watson MRN: [id number] DOB: 1951-04-18    ADMISSION DATE:  11/23/2018 CONSULTATION DATE: 11/23/2018  REFERRING MD : Dr. Estanislado Pandy   CHIEF COMPLAINT: Shortness of Breath   BRIEF PATIENT DESCRIPTION 68 yo male admitted with acute on chronic hypoxic respiratory failure secondary to AECOPD and LLL pneumonia requiring HFNC  SIGNIFICANT EVENTS/STUDIES:  04/15-Pt admitted to the stepdown unit  04/15-CTA Chest revealed dense consolidative infiltrate in the posterolateral aspect of the left lower lobe consistent pneumonia. No evidence of aortic dissection. Extensive aortic Atherosclerosis (ICD10-I70.0). Colonic diverticulosis. Multilevel severe lumbar spinal stenosis  HISTORY OF PRESENT ILLNESS:   This is a 68 yo male with a PMH as listed below who presented to [facility] ER via EMS on 04/15 with shortness of breath and low back pain (he does have chronic back pain).  Per ER notes EMS reported abnormal EKG, STEMI alert activated.  Upon EMS arrival pt short of breath and placed on NRB, he does wear chronic home O2 _0 .  Upon arrival to the ER pt hypertensive bp 266/236, initial EKG revealed SVT, hr 161, right BBB, ST depression.  He received iv cardizem, furosemide, and nitropaste.  Repeat EKG revealed sinus tachycardia hr 134, right BBB, ST depression with long QT.  CT Chest concerning for left lower lobe pneumonia.  Lab results revealed K+ 3.4, BNP 101, troponin <0.03, wbc 26.9, and lactic 3.0. Therefore, code sepsis initiated he received iv zosyn.  He was subsequently admitted to the stepdown unit for additional workup and treatment   PAST MEDICAL HISTORY :   has a past medical history of Arthritis, CAD (coronary artery disease), Chronic combined systolic (congestive) and diastolic (congestive) heart failure ([place]), Chronic respiratory failure with hypoxia ([place]), CML (chronic myelocytic leukemia) ([place]), COPD (chronic obstructive pulmonary disease) ([place]), Diabetes mellitus without complication  ([place]), Diabetic autonomic neuropathy associated with type 2 diabetes mellitus ([place]) (03/27/2015), Diverticulosis (2002), GERD (gastroesophageal reflux disease), HTN (hypertension), Hyperlipidemia, ILD (interstitial lung disease) ([place]), Ischemic cardiomyopathy, Medical non-compliance, Smoker, Spinal stenosis, TIA (transient ischemic attack), and Ulcer.  has a past surgical history that includes open heart surgery (2004); Cardiac catheterization (11/99); Coronary angioplasty; Eye surgery; Cataract extraction w/PHACO (Right, 11/24/2016); Cataract extraction w/PHACO (Left, 12/29/2016); Back surgery; Flexible bronchoscopy (N/A, 05/19/2017); Bronchoscopy; Coronary artery bypass graft; Upper gi endoscopy; Colonoscopy; Colonoscopy with propofol (N/A, 05/25/2017); Esophagogastroduodenoscopy (egd) with propofol (N/A, 05/25/2017); and RIGHT/LEFT HEART CATH AND CORONARY ANGIOGRAPHY (N/A, 05/03/2018). Prior to Admission medications   Medication Sig Start Date End Date Taking? Authorizing Provider  acetaminophen (TYLENOL) 500 MG tablet Take 1,000 mg by mouth daily as needed for moderate pain or headache.   Yes [provider]  aspirin 81 MG chewable tablet Chew 1 tablet (81 mg total) by mouth daily. 05/09/18  Yes Wieting, Richard, MD  atorvastatin (LIPITOR) 40 MG tablet Take 1 tablet (40 mg total) by mouth daily at 6 PM. Patient taking differently: Take 40 mg by mouth at bedtime.  11/14/18  Yes Gollan, Kathlene November, MD  budesonide (PULMICORT) 0.5 MG/2ML nebulizer solution Take 2 mLs (0.5 mg total) by nebulization 2 (two) times daily. 03/06/18 03/06/19 Yes Vaughan Basta, MD  carvedilol (COREG) 12.5 MG tablet Take 1 tablet (12.5 mg total) by mouth 2 (two) times daily. 11/14/18 02/12/19 Yes Gollan, Kathlene November, MD  cetirizine (ZYRTEC) 10 MG chewable tablet Chew 10 mg by mouth daily.   Yes [provider]  Cholecalciferol (VITAMIN D) 2000 units CAPS Take 2,000 Units by mouth every evening.   Yes [provider]  formoterol (PERFOROMIST) 20 MCG/2ML nebulizer solution Take 2 mLs (20 mcg total) by nebulization 2 (two) times daily. J44.9 06/10/18  Yes [name], MD  gabapentin (NEURONTIN) 300 MG capsule Take 1 capsule (300 mg total) by mouth at bedtime. 11/03/18  Yes Copland, Frederico Hamman, MD  ipratropium-albuterol (DUONEB) 0.5-2.5 (3) MG/3ML SOLN Take 3 mLs by nebulization every 4 (four) hours as needed. DX:J44.9 Patient taking differently: Take 3 mLs by nebulization every 4 (four) hours as needed (for shortness of breath/wheezing).  11/04/18  Yes [name], MD  ivabradine (CORLANOR) 5 MG TABS tablet Take 1 tablet (5 mg total) by mouth 2 (two) times daily with a meal. 08/08/18  Yes Hackney, Tina A, FNP  LORazepam (ATIVAN) 0.5 MG tablet Take 1 tablet (0.5 mg total) by mouth 3 (three) times daily as needed for anxiety. 11/11/18  Yes Copland, Frederico Hamman, MD  losartan (COZAAR) 25 MG tablet Take 0.5 tablets (12.5 mg total) by mouth daily. 11/14/18  Yes Minna Merritts, MD  Multiple Vitamin (MULTIVITAMIN WITH MINERALS) TABS tablet Take 1 tablet by mouth daily. 03/07/18  Yes Vaughan Basta, MD  nystatin (MYCOSTATIN) 100000 UNIT/ML suspension TAKE AS DIRECTED 5 MLS (500,000 UNITS TOTAL) IN THE MOUTH OR THROAT 4 TIMES DAILY Patient taking differently: Use as directed 5 mLs in the mouth or throat 4 (four) times daily.  10/12/18  Yes Copland, Frederico Hamman, MD  omeprazole (PRILOSEC) 20 MG capsule Take 20 mg by mouth daily.   Yes [provider]  ondansetron (ZOFRAN ODT) 4 MG disintegrating tablet Take 1-2 tablets (4-8 mg total) by mouth every 6 (six) hours as needed for nausea or vomiting. 11/16/18  Yes Copland, Frederico Hamman, MD  predniSONE (DELTASONE) 10 MG tablet Take 1 tablet (10 mg total) by mouth daily with breakfast. 11/11/18  Yes Copland, Frederico Hamman, MD  spironolactone (ALDACTONE) 25 MG tablet Take 1 tablet (25 mg total) by mouth daily. 11/14/18  Yes Gollan, Kathlene November, MD  tiZANidine (ZANAFLEX) 4 MG tablet Take 1  tablet (4 mg total) by mouth Nightly. 03/23/18  Yes Copland, Frederico Hamman, MD  torsemide (DEMADEX) 20 MG tablet Take 3 tablets (60 mg total) by mouth daily. 10/03/18 01/01/19 Yes Hackney, Otila Kluver A, FNP  vitamin C (ASCORBIC ACID) 500 MG tablet Take 500 mg by mouth daily.   Yes [provider]  blood glucose meter kit and supplies Dispense based on patient and insurance preference. Use to check blood sugar daily. 11/16/18   Copland, Frederico Hamman, MD   No Known Allergies  FAMILY HISTORY:  family history includes Breast cancer in an other family member; Cancer in his mother; Coronary artery disease in an other family member; Heart disease in his father. SOCIAL HISTORY:  reports that he quit smoking about 9 months ago. His smoking use included cigarettes. He has a 22.50 pack-year smoking history. He has never used smokeless tobacco. He reports previous alcohol use. He reports that he does not use drugs.  REVIEW OF SYSTEMS: Positives in BOLD  Constitutional: Negative for fever, chills, weight loss, malaise/fatigue and diaphoresis.  HENT: Negative for hearing loss, ear pain, nosebleeds, congestion, sore throat, neck pain, tinnitus and ear discharge.   Eyes: Negative for blurred vision, double vision, photophobia, pain, discharge and redness.  Respiratory: cough, hemoptysis, sputum production, shortness of breath, wheezing and stridor.   Cardiovascular: Negative for chest pain, palpitations, orthopnea, claudication, leg swelling and PND.  Gastrointestinal: Negative for heartburn, nausea, vomiting, abdominal pain, diarrhea, constipation, blood in stool and melena.  Genitourinary: Negative for dysuria,  urgency, frequency, hematuria and flank pain.  Musculoskeletal: myalgias, back pain, joint pain and falls.  Skin: Negative for itching and rash.  Neurological: Negative for dizziness, tingling, tremors, sensory change, speech change, focal weakness, seizures, loss of consciousness, weakness and headaches.    Endo/Heme/Allergies: Negative for environmental allergies and polydipsia. Does not bruise/bleed easily.  SUBJECTIVE:  Pt c/o shortness of breath   VITAL SIGNS: Temp:  [97.8 F (36.6 C)-99.9 F (37.7 C)] 98 F (36.7 C) (04/15 1500) Pulse Rate:  [63-162] 70 (04/15 1500) Resp:  [13-43] 32 (04/15 1500) BP: (85-266)/(21-236) 139/104 (04/15 1500) SpO2:  [66 %-100 %] 93 % (04/15 1500) Weight:  [88.3 kg-88.5 kg] 88.3 kg (04/15 1439)  PHYSICAL EXAMINATION: General: acutely ill appearing male, in mild respiratory distress  Neuro: alert and oriented, follows commands  HEENT: supple, mild JVD  Cardiovascular: sinus tach, no R/G Lungs: rhonchi throughout, tachypneic, slightly labored  Abdomen: +BS x4, soft, non tender, non distended  Musculoskeletal: 1+ bilateral lower extremity edema Skin: scattered ecchymosis   Recent Labs  Lab 11/23/18 0735  NA 137  K 3.4*  CL 91*  CO2 31  BUN 17  CREATININE 0.89  GLUCOSE 240*   Recent Labs  Lab 11/23/18 0735  HGB 12.2*  HCT 37.1*  WBC 26.9*  PLT 200   Dg Chest Port 1 View  Result Date: 11/23/2018 CLINICAL DATA:  Shaking with low back pain and dry heaving. Shortness of breath. EXAM: PORTABLE CHEST 1 VIEW COMPARISON:  05/08/2018 and 05/01/2018 radiographs.  CT 05/13/2017. FINDINGS: 0734 hours. Two views obtained. The heart size and mediastinal contours are stable status post median sternotomy. There is stable chronic interstitial lung disease without evidence of superimposed edema, confluent airspace opacity or pleural effusion. There is no pneumothorax. Telemetry leads overlie the chest. 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 No evidence of acute superimposed process. Electronically Signed   By: Richardean Sale M.D.   On: 11/23/2018 08:23   Ct Angio Chest/abd/pel For Dissection W And/or Wo Contrast  Result Date: 11/23/2018 CLINICAL DATA:  Acute chest and upper  back pain and shortness of breath. EXAM: CT ANGIOGRAPHY CHEST, ABDOMEN AND PELVIS TECHNIQUE: Multidetector CT imaging through the chest, abdomen and pelvis was performed using the standard protocol during bolus administration of intravenous contrast. Multiplanar reconstructed images and MIPs were obtained and reviewed to evaluate the vascular anatomy. CONTRAST:  126m ISOVUE-370 IOPAMIDOL (ISOVUE-370) INJECTION 76% COMPARISON:  Chest x-ray dated 11/23/2018 and chest CT dated 05/13/2017 and CT scan of the abdomen and pelvis dated 02/24/2017 FINDINGS: CTA CHEST FINDINGS Cardiovascular: There is extensive coronary artery calcification. Minimal aortic atherosclerosis. Heart size is normal. No pericardial effusion. No evidence of thoracic aortic dissection. Mediastinum/Nodes: Chronic lobulation of the thyroid gland, unchanged. Trachea and esophagus are normal. No significant hilar or mediastinal adenopathy. Lungs/Pleura: There is a dense consolidative infiltrate in the posterolateral aspect of the left lower lobe. This is superimposed on chronic interstitial and obstructive lung disease. No effusions. Musculoskeletal: Previous median sternotomy. No acute bone abnormality. Review of the MIP images confirms the above findings. CTA ABDOMEN AND PELVIS FINDINGS VASCULAR Aorta: Aortic atherosclerosis. Celiac: Widely patent. SMA: Widely patent. Renals: There are at least 4 widely patent right renal arteries. There is a single widely patent left renal artery. IMA: IMA is patent.  Dense calcification at the origin. Inflow: Patent without evidence of aneurysm, dissection, vasculitis or significant stenosis. Veins: No obvious venous abnormality within the limitations of this  arterial phase study. Review of the MIP images confirms the above findings. NON-VASCULAR Hepatobiliary: No focal liver abnormality is seen. No gallstones, gallbladder wall thickening, or biliary dilatation. Pancreas: Unremarkable. No pancreatic ductal  dilatation or surrounding inflammatory changes. Spleen: Normal in size without focal abnormality. Adrenals/Urinary Tract: Adrenal glands are unremarkable. Kidneys are normal, without renal calculi, focal lesion, or hydronephrosis. Bladder is unremarkable. Stomach/Bowel: Stomach is within normal limits. Appendix appears normal. No evidence of bowel wall thickening, distention, or inflammatory changes. Scattered diverticula in the descending and sigmoid portions of the colon. Lymphatic: No adenopathy. Reproductive: Prostate is unremarkable. Other: No abdominal wall hernia or abnormality. No abdominopelvic ascites. Musculoskeletal: No acute abnormality. Severe spinal stenosis at L2-3, L3-4, and L4-5. Diffuse degenerative disc and joint disease in the lumbar spine. Review of the MIP images confirms the above findings. IMPRESSION: 1. Dense consolidative infiltrate in the posterolateral aspect of the left lower lobe consistent pneumonia. 2. No evidence of aortic dissection. 3.  Extensive aortic Atherosclerosis (ICD10-I70.0). 4. Colonic diverticulosis. 5. Multilevel severe lumbar spinal stenosis. Electronically Signed   By: Lorriane Shire M.D.   On: 11/23/2018 10:39    ASSESSMENT / PLAN:  Acute on chronic hypoxic respiratory failure secondary to AECOPD and LLL pneumonia  Hx: Chronic home O2 _0  and Respiratory bronchiolitis  Supplemental O2 for dyspnea and/or hypoxia  Scheduled and prn bronchodilator therapy Nebulized steroids  Chest physiotherapy  Will add mucinex   HTN  Supraventricular tachycardia-resolved Hx: Chronic combined systolic diastolic CHF, HLD, Ischemic cardiomyopathy  Continuous telemetry monitoring  Continue outpatient cardiac medications  Leukocytosis  LLL pneumonia  Hx: CML Trend WBC and monitor fever curve  Will check PCT and trend lactic acid  Follow cultures  Continue cefepime and discontinue vancomycin   Anemia without obvious acute blood loss  VTE px: subq lovenox  Trend  CBC  Monitor for s/sx of bleeding and transfuse for hgb <7  Diabetes Mellitus  [facility]  Marda Stalker, [place] Pager [phone] (please enter 7 digits) PCCM Consult Pager [phone] (please enter 7 digits)

## 2018-11-23 NOTE — ED Notes (Signed)
RT here to place pt on bipap 

## 2018-11-23 NOTE — ED Notes (Signed)
Dr Jimmye Norman notified of lactic 3.0- stated he would put in orders for antibiotics

## 2018-11-23 NOTE — ED Notes (Signed)
Pt refuses to lay on back- pt repositioned but rolls back over to side- pt keeps removing nonrebreather

## 2018-11-23 NOTE — ED Notes (Signed)
Pt placed on 10L via nonrebreather per Dr Jimmye Norman

## 2018-11-23 NOTE — ED Notes (Signed)
Pt constantly attempting to climb out of bed, removing bi-pap, taking monitoring equipment off

## 2018-11-23 NOTE — ED Notes (Signed)
Dr Jimmye Norman canceled code STEMI

## 2018-11-23 NOTE — ED Notes (Signed)
Pt to be taken off bi-pap and placed on non-rebreather at 15L for transport to CT per Dr Jimmye Norman

## 2018-11-23 NOTE — ED Notes (Signed)
Safety sitter in room.

## 2018-11-23 NOTE — ED Notes (Signed)
BP cuff changed to smaller cuff and repositioned  Dr Jimmye Norman at bedside

## 2018-11-23 NOTE — ED Notes (Signed)
ED TO INPATIENT HANDOFF REPORT  ED Nurse Name and Phone #: Domique Reardon [phone]  S Name/Age/Gender Tony Watson 68 y.o. male Room/Bed: ED11A/ED11A  Code Status   Code Status: Prior  Home/SNF/Other Home A&Ox4 Is this baseline? Yes   Triage Complete: Triage complete  Chief Complaint stemi  Triage Note Arrived via EMS for shaking, low back pain, dry heaving- EMS called code STEMI and put pt on nonrebreather- pt taken off nonrebreather and put on [place] and having some SHOB   Allergies No Known Allergies  Level of Care/Admitting Diagnosis ED Disposition    ED Disposition Condition [place]: [place] [[id number]]  Level of Care: Stepdown [14]  Diagnosis: Acute respiratory failure ([place]) [518.81.ICD-9-CM]  Admitting Physician: Saundra Shelling [[id number]]  Attending Physician: Saundra Shelling [[id number]]  Estimated length of stay: past midnight tomorrow  Certification:: I certify this patient will need inpatient services for at least 2 midnights  Possible Covid Disease Patient Isolation: N/A  PT Class (Do Not Modify): Inpatient [101]  PT Acc Code (Do Not Modify): Private [1]       B Medical/Surgery History Past Medical History:  Diagnosis Date  . Arthritis   . CAD (coronary artery disease)    a. inf MI 11/99 (in [place]) w/ PCI to [facility]; b. [place] 2004 [facility]): EF 50%, patent RCA stent, no obs dz; c. MV 2008: EF 42%, inf infarct, no ischemia; d. R/LHC 9/19: 3-v dz w/ mild dif dz LM, m-dLAD 50, dLAD 60, D2 80, RI 80, mRCA-1 90, mRCA-2 20, patent RCA stent, CO/CI 3.7/2  . Chronic combined systolic (congestive) and diastolic (congestive) heart failure ([place])    a. 7/29019 Echo: EF 25-30%, sev diff HK, mild MR mod dil LA, mildly reduced RVSF, elevated CVP  . Chronic respiratory failure with hypoxia (HCC)    a. on 2L supplemental oxygen via [place] followed by pulmonology  . CML (chronic myelocytic leukemia) ([place])   . COPD (chronic obstructive pulmonary  disease) ([place])   . Diabetes mellitus without complication ([place])   . Diabetic autonomic neuropathy associated with type 2 diabetes mellitus ([place]) 03/27/2015  . Diverticulosis 2002  . GERD (gastroesophageal reflux disease)   . HTN (hypertension)   . Hyperlipidemia   . ILD (interstitial lung disease) ([place])   . Ischemic cardiomyopathy   . Medical non-compliance   . Smoker   . Spinal stenosis   . TIA (transient ischemic attack)    a. s/p PFO closure 2004 (in [place])  . Ulcer    Past Surgical History:  Procedure Laterality Date  . BACK SURGERY     patient denies-just lumbar punctures  . BRONCHOSCOPY    . CARDIAC CATHETERIZATION  11/99   CI per [facility]  . CATARACT EXTRACTION W/PHACO Right 11/24/2016   Procedure: CATARACT EXTRACTION PHACO AND INTRAOCULAR LENS PLACEMENT (IOC);  Surgeon: Birder Robson, MD;  Location: [facility] ORS;  Service: Ophthalmology;  Laterality: Right;  [place] 1:04.3 AP% 22.3 CDE 14.35 Fluid pack lot # [id number]  . CATARACT EXTRACTION W/PHACO Left 12/29/2016   Procedure: CATARACT EXTRACTION PHACO AND INTRAOCULAR LENS PLACEMENT (IOC);  Surgeon: Birder Robson, MD;  Location: [facility] ORS;  Service: Ophthalmology;  Laterality: Left;  [place] 00:52 AP% 18.5 CDE 9.67 Fluid pack lot # [id number]  . COLONOSCOPY    . COLONOSCOPY WITH PROPOFOL N/A 05/25/2017   Procedure: COLONOSCOPY WITH PROPOFOL;  Surgeon: Jonathon Bellows, MD;  Location: [facility] ENDOSCOPY;  Service: Gastroenterology;  Laterality: N/A;  . CORONARY ANGIOPLASTY  STENT  . CORONARY ARTERY BYPASS GRAFT     stent  . ESOPHAGOGASTRODUODENOSCOPY (EGD) WITH PROPOFOL N/A 05/25/2017   Procedure: ESOPHAGOGASTRODUODENOSCOPY (EGD) WITH PROPOFOL;  Surgeon: Jonathon Bellows, MD;  Location: [facility] ENDOSCOPY;  Service: Gastroenterology;  Laterality: N/A;  . EYE SURGERY    . FLEXIBLE BRONCHOSCOPY N/A 05/19/2017   Procedure: FLEXIBLE BRONCHOSCOPY;  Surgeon: Wilhelmina Mcardle, MD;  Location: [facility] ORS;  Service: Pulmonary;  Laterality: N/A;  . open heart  surgery  2004   PFO repair  . RIGHT/LEFT HEART CATH AND CORONARY ANGIOGRAPHY N/A 05/03/2018   Procedure: RIGHT/LEFT HEART CATH AND CORONARY ANGIOGRAPHY;  Surgeon: Nelva Bush, MD;  Location: [facility] LAB;  Service: Cardiovascular;  Laterality: N/A;  . UPPER GI ENDOSCOPY       A IV Location/Drains/Wounds Patient Lines/Drains/Airways Status   Active Line/Drains/Airways    Name:   Placement date:   Placement time:   Site:   Days:   Peripheral IV 11/23/18 Left Hand   11/23/18    0740    Hand   less than 1   Peripheral IV 11/23/18 Right Forearm   11/23/18    0741    Forearm   less than 1   External Urinary Catheter   11/23/18    0850    -   less than 1          Intake/Output Last 24 hours  Intake/Output Summary (Last 24 hours) at 11/23/2018 1341 Last data filed at 11/23/2018 1159 Gross per 24 hour  Intake 300 ml  Output 250 ml  Net 50 ml    Labs/Imaging Results for orders placed or performed during the hospital encounter of 11/23/18 (from the past 48 hour(s))  CBC     Status: Abnormal   Collection Time: 11/23/18  7:35 AM  Result Value Ref Range   WBC 26.9 (H) 4.0 - 10.5 K/uL   RBC 3.59 (L) 4.22 - 5.81 MIL/uL   Hemoglobin 12.2 (L) 13.0 - 17.0 g/dL   HCT 37.1 (L) 39.0 - 52.0 %   MCV 103.3 (H) 80.0 - 100.0 fL   MCH 34.0 26.0 - 34.0 pg   MCHC 32.9 30.0 - 36.0 g/dL   RDW 15.4 11.5 - 15.5 %   Platelets 200 150 - 400 K/uL   nRBC 0.1 0.0 - 0.2 %    Comment: Performed at [facility], [address] [place], [place] [address]  Troponin I - Once     Status: Abnormal   Collection Time: 11/23/18  7:35 AM  Result Value Ref Range   Troponin I 0.03 (HH) <0.03 ng/mL    Comment: CRITICAL RESULT CALLED TO, READ BACK BY AND VERIFIED WITH AERIEL WALLAS_0  ON 11/23/18 BY HKP Performed at [facility] [place], [address] [place], [place] [address]   Comprehensive metabolic panel     Status: Abnormal   Collection Time: 11/23/18  7:35 AM  Result Value Ref  Range   Sodium 137 135 - 145 mmol/L   Potassium 3.4 (L) 3.5 - 5.1 mmol/L   Chloride 91 (L) 98 - 111 mmol/L   CO2 31 22 - 32 mmol/L   Glucose, Bld 240 (H) 70 - 99 mg/dL   BUN 17 8 - 23 mg/dL   Creatinine, Ser 0.89 0.61 - 1.24 mg/dL   Calcium 9.1 8.9 - 10.3 mg/dL   Total Protein 8.1 6.5 - 8.1 g/dL   Albumin 4.0 3.5 - 5.0 g/dL   AST 29 15 - 41 U/L  ALT 39 0 - 44 U/L   Alkaline Phosphatase 47 38 - 126 U/L   Total Bilirubin 1.0 0.3 - 1.2 mg/dL   GFR calc non Af Amer >60 >60 mL/min   GFR calc Af Amer >60 >60 mL/min   Anion gap 15 5 - 15    Comment: Performed at [facility], [address] [place], [place] [address]  Brain natriuretic peptide     Status: Abnormal   Collection Time: 11/23/18  7:35 AM  Result Value Ref Range   B Natriuretic Peptide 101.0 (H) 0.0 - 100.0 pg/mL    Comment: Performed at [facility], [address] [place], [place] [address]  Blood gas, venous     Status: Abnormal   Collection Time: 11/23/18  8:07 AM  Result Value Ref Range   FIO2 0.21    Delivery systems ROOM AIR    pH, Ven 7.40 7.250 - 7.430   pCO2, Ven 54 44.0 - 60.0 mmHg   pO2, Ven 55.0 (H) 32.0 - 45.0 mmHg   Bicarbonate 33.4 (H) 20.0 - 28.0 mmol/L   Acid-Base Excess 7.0 (H) 0.0 - 2.0 mmol/L   O2 Saturation 88.2 %   Patient temperature 37.0    Collection site VENOUS    Sample type VENOUS     Comment: Performed at [facility], [address] [place], [place] [address]  SARS Coronavirus 2 [facility] order, Performed in [place] hospital lab)     Status: None   Collection Time: 11/23/18  8:52 AM  Result Value Ref Range   SARS Coronavirus 2 NEGATIVE NEGATIVE    Comment: (NOTE) If result is NEGATIVE SARS-CoV-2 target nucleic acids are NOT DETECTED. The SARS-CoV-2 RNA is generally detectable in upper and lower  respiratory specimens during the acute phase of infection. The lowest  concentration of SARS-CoV-2 viral copies this assay can detect is 250  copies /  mL. A negative result does not preclude SARS-CoV-2 infection  and should not be used as the sole basis for treatment or other  patient management decisions.  A negative result may occur with  improper specimen collection / handling, submission of specimen other  than nasopharyngeal swab, presence of viral mutation(s) within the  areas targeted by this assay, and inadequate number of viral copies  (<250 copies / mL). A negative result must be combined with clinical  observations, patient history, and epidemiological information. If result is POSITIVE SARS-CoV-2 target nucleic acids are DETECTED. The SARS-CoV-2 RNA is generally detectable in upper and lower  respiratory specimens dur ing the acute phase of infection.  Positive  results are indicative of active infection with SARS-CoV-2.  Clinical  correlation with patient history and other diagnostic information is  necessary to determine patient infection status.  Positive results do  not rule out bacterial infection or co-infection with other viruses. If result is PRESUMPTIVE POSTIVE SARS-CoV-2 nucleic acids MAY BE PRESENT.   A presumptive positive result was obtained on the submitted specimen  and confirmed on repeat testing.  While 2019 novel coronavirus  (SARS-CoV-2) nucleic acids may be present in the submitted sample  additional confirmatory testing may be necessary for epidemiological  and / or clinical management purposes  to differentiate between  SARS-CoV-2 and other Sarbecovirus currently known to infect humans.  If clinically indicated additional testing with an alternate test  methodology [phone]) is advised. The SARS-CoV-2 RNA is generally  detectable in upper and lower respiratory sp ecimens during the acute  phase of  infection. The expected result is Negative. Fact Sheet for Patients:  StrictlyIdeas.no Fact Sheet for Healthcare Providers: BankingDealers.co.za This test is  not yet approved or cleared by the [place] FDA and has been authorized for detection and/or diagnosis of SARS-CoV-2 by FDA under an Emergency Use Authorization (EUA).  This EUA will remain in effect (meaning this test can be used) for the duration of the COVID-19 declaration under Section 564(b)(1) of the Act, 21 U.S.C. section 360bbb-3(b)(1), unless the authorization is terminated or revoked sooner. Performed at [facility], [place] [address], [place], [place] [address]   Lactic acid, plasma     Status: Abnormal   Collection Time: 11/23/18  9:15 AM  Result Value Ref Range   Lactic Acid, Venous 3.0 (HH) 0.5 - 1.9 mmol/L    Comment: CRITICAL RESULT CALLED TO, READ BACK BY AND VERIFIED WITH AIREL WALLACE_0  ON 11/23/18 BY HKP Performed at [facility], [address] [place], [place] [address]   Urinalysis, Complete w Microscopic     Status: Abnormal   Collection Time: 11/23/18 10:47 AM  Result Value Ref Range   Color, Urine YELLOW (A) YELLOW   APPearance CLEAR (A) CLEAR   Specific Gravity, Urine 1.020 1.005 - 1.030   pH 5.0 5.0 - 8.0   Glucose, UA 50 (A) NEGATIVE mg/dL   Hgb urine dipstick SMALL (A) NEGATIVE   Bilirubin Urine NEGATIVE NEGATIVE   Ketones, ur NEGATIVE NEGATIVE mg/dL   Protein, ur NEGATIVE NEGATIVE mg/dL   Nitrite NEGATIVE NEGATIVE   Leukocytes,Ua NEGATIVE NEGATIVE   RBC / HPF 0-5 0 - 5 RBC/hpf   WBC, UA 0-5 0 - 5 WBC/hpf   Bacteria, UA NONE SEEN NONE SEEN   Squamous Epithelial / LPF 0-5 0 - 5   Mucus PRESENT     Comment: Performed at [facility], [address], [place], [place] [address]   Dg Chest Port 1 View  Result Date: 11/23/2018 CLINICAL DATA:  Shaking with low back pain and dry heaving. Shortness of breath. EXAM: PORTABLE CHEST 1 VIEW COMPARISON:  05/08/2018 and 05/01/2018 radiographs.  CT 05/13/2017. FINDINGS: 0734 hours. Two views obtained. The heart size and mediastinal contours are stable status post median  sternotomy. There is stable chronic interstitial lung disease without evidence of superimposed edema, confluent airspace opacity or pleural effusion. There is no pneumothorax. Telemetry leads overlie the chest. 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 No evidence of acute superimposed process. Electronically Signed   By: Richardean Sale M.D.   On: 11/23/2018 08:23   Ct Angio Chest/abd/pel For Dissection W And/or Wo Contrast  Result Date: 11/23/2018 CLINICAL DATA:  Acute chest and upper back pain and shortness of breath. EXAM: CT ANGIOGRAPHY CHEST, ABDOMEN AND PELVIS TECHNIQUE: Multidetector CT imaging through the chest, abdomen and pelvis was performed using the standard protocol during bolus administration of intravenous contrast. Multiplanar reconstructed images and MIPs were obtained and reviewed to evaluate the vascular anatomy. CONTRAST:  157m ISOVUE-370 IOPAMIDOL (ISOVUE-370) INJECTION 76% COMPARISON:  Chest x-ray dated 11/23/2018 and chest CT dated 05/13/2017 and CT scan of the abdomen and pelvis dated 02/24/2017 FINDINGS: CTA CHEST FINDINGS Cardiovascular: There is extensive coronary artery calcification. Minimal aortic atherosclerosis. Heart size is normal. No pericardial effusion. No evidence of thoracic aortic dissection. Mediastinum/Nodes: Chronic lobulation of the thyroid gland, unchanged. Trachea and esophagus are normal. No significant hilar or mediastinal adenopathy. Lungs/Pleura: There is a dense consolidative infiltrate in the  posterolateral aspect of the left lower lobe. This is superimposed on chronic interstitial and obstructive lung disease. No effusions. Musculoskeletal: Previous median sternotomy. No acute bone abnormality. Review of the MIP images confirms the above findings. CTA ABDOMEN AND PELVIS FINDINGS VASCULAR Aorta: Aortic atherosclerosis. Celiac: Widely patent. SMA: Widely patent. Renals: There are  at least 4 widely patent right renal arteries. There is a single widely patent left renal artery. IMA: IMA is patent.  Dense calcification at the origin. Inflow: Patent without evidence of aneurysm, dissection, vasculitis or significant stenosis. Veins: No obvious venous abnormality within the limitations of this arterial phase study. Review of the MIP images confirms the above findings. NON-VASCULAR Hepatobiliary: No focal liver abnormality is seen. No gallstones, gallbladder wall thickening, or biliary dilatation. Pancreas: Unremarkable. No pancreatic ductal dilatation or surrounding inflammatory changes. Spleen: Normal in size without focal abnormality. Adrenals/Urinary Tract: Adrenal glands are unremarkable. Kidneys are normal, without renal calculi, focal lesion, or hydronephrosis. Bladder is unremarkable. Stomach/Bowel: Stomach is within normal limits. Appendix appears normal. No evidence of bowel wall thickening, distention, or inflammatory changes. Scattered diverticula in the descending and sigmoid portions of the colon. Lymphatic: No adenopathy. Reproductive: Prostate is unremarkable. Other: No abdominal wall hernia or abnormality. No abdominopelvic ascites. Musculoskeletal: No acute abnormality. Severe spinal stenosis at L2-3, L3-4, and L4-5. Diffuse degenerative disc and joint disease in the lumbar spine. Review of the MIP images confirms the above findings. IMPRESSION: 1. Dense consolidative infiltrate in the posterolateral aspect of the left lower lobe consistent pneumonia. 2. No evidence of aortic dissection. 3.  Extensive aortic Atherosclerosis (ICD10-I70.0). 4. Colonic diverticulosis. 5. Multilevel severe lumbar spinal stenosis. Electronically Signed   By: Lorriane Shire M.D.   On: 11/23/2018 10:39    Pending Labs Unresulted Labs (From admission, onward)    Start     Ordered   11/24/18 0500  Creatinine, serum  Tomorrow morning,   STAT     11/23/18 1325   11/23/18 1400  Lactic acid, plasma   Once,   STAT     11/23/18 1254   11/23/18 1324  MRSA PCR Screening  Once,   STAT     11/23/18 1325   11/23/18 1212  Lactic acid, plasma  ONCE - STAT,   STAT     11/23/18 1211   11/23/18 0902  Blood culture (routine x 2)  BLOOD CULTURE X 2,   STAT     11/23/18 0902   Signed and Held  CBC  (enoxaparin (LOVENOX)    CrCl >/= 30 ml/min)  Once,   R    Comments:  Baseline for enoxaparin therapy IF NOT ALREADY DRAWN.  Notify MD if PLT < 100 K.    Signed and Held   Signed and Held  Creatinine, serum  (enoxaparin (LOVENOX)    CrCl >/= 30 ml/min)  Once,   R    Comments:  Baseline for enoxaparin therapy IF NOT ALREADY DRAWN.    Signed and Held   Signed and Held  Creatinine, serum  (enoxaparin (LOVENOX)    CrCl >/= 30 ml/min)  Weekly,   R    Comments:  while on enoxaparin therapy    Signed and Held   Signed and Held  Basic metabolic panel  Tomorrow morning,   R     Signed and Held   Signed and Held  CBC  Tomorrow morning,   R     Signed and Held          Vitals/Pain Today's  Vitals   11/23/18 1230 11/23/18 1240 11/23/18 1310 11/23/18 1315  BP: (!) 112/92 111/65 111/77 106/68  Pulse: (!) 130 63 (!) 128 (!) 126  Resp: (!) 23 (!) 28 (!) 25 (!) 32  Temp:      TempSrc:      SpO2: 95% 97% 97% 99%  Weight:      Height:      PainSc:        Isolation Precautions No active isolations  Medications Medications  ipratropium-albuterol (DUONEB) 0.5-2.5 (3) MG/3ML nebulizer solution 3 mL (has no administration in time range)  insulin aspart (novoLOG) injection 0-15 Units (has no administration in time range)  insulin aspart (novoLOG) injection 0-5 Units (has no administration in time range)  ceFEPIme (MAXIPIME) 2 g in sodium chloride 0.9 % 100 mL IVPB (has no administration in time range)  vancomycin (VANCOCIN) 1,250 mg in sodium chloride 0.9 % 250 mL IVPB (has no administration in time range)  LORazepam (ATIVAN) injection 1 mg (1 mg Intravenous Given 11/23/18 0739)  diltiazem (CARDIZEM)  injection 20 mg (20 mg Intravenous Given 11/23/18 0745)  LORazepam (ATIVAN) injection 1 mg (1 mg Intravenous Given 11/23/18 0806)  nitroGLYCERIN (NITROGLYN) 2 % ointment 1 inch (1 inch Topical Given 11/23/18 0815)  furosemide (LASIX) injection 80 mg (80 mg Intravenous Given 11/23/18 0814)  iopamidol (ISOVUE-370) 76 % injection 100 mL (100 mLs Intravenous Contrast Given 11/23/18 0952)  vancomycin (VANCOCIN) IVPB 1000 mg/200 mL premix (0 mg Intravenous Stopped 11/23/18 1159)  piperacillin-tazobactam (ZOSYN) IVPB 3.375 g (0 g Intravenous Stopped 11/23/18 1047)  potassium chloride SA (K-DUR,KLOR-CON) CR tablet 40 mEq (40 mEq Oral Given 11/23/18 1302)    Mobility walks High fall risk   Focused Assessments Pulmonary Assessment Handoff:  Lung sounds:            R Recommendations: See Admitting Provider Note  Report given to:   Additional Notes: pt is A&O x4 at this time. Pt is a NRB at 15L at this time. Per pt- is on 4L via [place] chronic oxygen at home.

## 2018-11-23 NOTE — Telephone Encounter (Signed)
Patients wife, [name] came in today for lab work. Wanted to let Dr Lorelei Pont know her husband was admitted today with possible PNA, WBC 26.9.she is very worried.

## 2018-11-23 NOTE — Progress Notes (Signed)
Pt with wet cough, wheezing in bilateral upper lobes.  Pt with dsypnea at rest.  Current SpO2 is 95%.  Notified Dewaine Conger, NP.  PRN nebulizderd treatments order along with his scheduled treatments.  Notified RT.  Stated she would come buy during scheduled time.

## 2018-11-23 NOTE — ED Triage Notes (Addendum)
Arrived via EMS for shaking, low back pain, dry heaving- EMS called code STEMI and put pt on nonrebreather- pt taken off nonrebreather and put on [place] and having some SHOB

## 2018-11-23 NOTE — ED Notes (Signed)
Pt returned from CT °

## 2018-11-23 NOTE — ED Notes (Addendum)
Pt attempted to climb out of bed - he pulled O2 tubing from wall and desat to 66% with good waveform - Dr Jimmye Norman notified - pt reconnected to O2 and pulled up in bed - O2 sat improved to 93% on 15L non-rebreather Pt had been oriented to call bell prior to this nurse leaving the room approx 5 min earlier

## 2018-11-23 NOTE — ED Provider Notes (Addendum)
[facility] Emergency Department Provider Note       Time seen: ----------------------------------------- 7:51 AM on 11/23/2018 -----------------------------------------   I have reviewed the triage vital signs and the nursing notes.  HISTORY   Chief Complaint Code STEMI    HPI Tony Watson is a 68 y.o. male with a history of arthritis, coronary artery disease, CHF, COPD, GERD, hyperlipidemia, interstitial lung disease, spinal stenosis who presents to the ED for low back pain.  Patient states he was sitting on the side of the bed having low back pain.  He started having dry heaving and so EMS was called.  EMS found his prehospital EKG to be abnormal, STEMI alert was activated.  He was placed on a nonrebreather.  He does have shortness of breath and wears oxygen at baseline.  Patient denies any fevers, has had some cough and shortness of breath.  He reports he has had back pain in the past.  Past Medical History:  Diagnosis Date  . Arthritis   . CAD (coronary artery disease)    a. inf MI 11/99 (in [place]) w/ PCI to [facility]; b. [place] 2004 [facility]): EF 50%, patent RCA stent, no obs dz; c. MV 2008: EF 42%, inf infarct, no ischemia; d. R/LHC 9/19: 3-v dz w/ mild dif dz LM, m-dLAD 50, dLAD 60, D2 80, RI 80, mRCA-1 90, mRCA-2 20, patent RCA stent, CO/CI 3.7/2  . Chronic combined systolic (congestive) and diastolic (congestive) heart failure ([place])    a. 7/29019 Echo: EF 25-30%, sev diff HK, mild MR mod dil LA, mildly reduced RVSF, elevated CVP  . Chronic respiratory failure with hypoxia (HCC)    a. on 2L supplemental oxygen via [place] followed by pulmonology  . CML (chronic myelocytic leukemia) ([place])   . COPD (chronic obstructive pulmonary disease) ([place])   . Diabetic autonomic neuropathy associated with type 2 diabetes mellitus ([place]) 03/27/2015  . Diverticulosis 2002  . GERD (gastroesophageal reflux disease)   . HTN (hypertension)   . Hyperlipidemia   . ILD (interstitial  lung disease) ([place])   . Ischemic cardiomyopathy   . Medical non-compliance   . Smoker   . Spinal stenosis   . TIA (transient ischemic attack)    a. s/p PFO closure 2004 (in [place])  . Ulcer     Patient Active Problem List   Diagnosis Date Noted  . Chronic systolic heart failure ([place]) 05/13/2018  . HTN (hypertension) 05/13/2018  . ILD (interstitial lung disease) ([place])   . DNR / DNI, Advanced Directives Counselling 04/27/2018 04/28/2018    Class: Acute  . Ischemic cardiomyopathy 03/24/2018  . Protein-calorie malnutrition, severe 03/03/2018  . 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 Diabetic autonomic neuropathy associated with type 2 diabetes mellitus ([place]) 03/27/2015  . History of inferior MI (myocardial infarction) 10/14/2013  . CAD (coronary artery disease), native coronary artery   . History of noncompliance with medical treatment 05/30/2012  . Stage 4 very severe COPD by GOLD classification ([place]) 03/23/2011  . ALLERGIC RHINITIS CAUSE UNSPECIFIED 07/31/2010  . Type 2 diabetes mellitus with vascular disease ([place]) 01/02/2010  . Ex-smoker 04/17/2009  . TRANSIENT ISCHEMIC ATTACKS, HX OF 04/17/2009  . Hyperlipidemia 12/19/2008    Past Surgical History:  Procedure Laterality Date  . BACK SURGERY     patient denies-just lumbar punctures  . BRONCHOSCOPY    . CARDIAC CATHETERIZATION  11/99   CI per [facility]  . CATARACT EXTRACTION W/PHACO Right 11/24/2016   Procedure: CATARACT  EXTRACTION PHACO AND INTRAOCULAR LENS PLACEMENT (IOC);  Surgeon: Birder Robson, MD;  Location: [facility] ORS;  Service: Ophthalmology;  Laterality: Right;  [place] 1:04.3 AP% 22.3 CDE 14.35 Fluid pack lot # [id number]  . CATARACT EXTRACTION W/PHACO Left 12/29/2016   Procedure: CATARACT EXTRACTION PHACO AND INTRAOCULAR LENS PLACEMENT (IOC);  Surgeon: Birder Robson, MD;  Location: [facility] ORS;  Service: Ophthalmology;  Laterality: Left;  [place] 00:52 AP% 18.5 CDE 9.67 Fluid pack lot #  [id number]  . COLONOSCOPY    . COLONOSCOPY WITH PROPOFOL N/A 05/25/2017   Procedure: COLONOSCOPY WITH PROPOFOL;  Surgeon: Jonathon Bellows, MD;  Location: [facility] ENDOSCOPY;  Service: Gastroenterology;  Laterality: N/A;  . CORONARY ANGIOPLASTY     STENT  . CORONARY ARTERY BYPASS GRAFT     stent  . ESOPHAGOGASTRODUODENOSCOPY (EGD) WITH PROPOFOL N/A 05/25/2017   Procedure: ESOPHAGOGASTRODUODENOSCOPY (EGD) WITH PROPOFOL;  Surgeon: Jonathon Bellows, MD;  Location: [facility] ENDOSCOPY;  Service: Gastroenterology;  Laterality: N/A;  . EYE SURGERY    . FLEXIBLE BRONCHOSCOPY N/A 05/19/2017   Procedure: FLEXIBLE BRONCHOSCOPY;  Surgeon: Wilhelmina Mcardle, MD;  Location: [facility] ORS;  Service: Pulmonary;  Laterality: N/A;  . open heart surgery  2004   PFO repair  . RIGHT/LEFT HEART CATH AND CORONARY ANGIOGRAPHY N/A 05/03/2018   Procedure: RIGHT/LEFT HEART CATH AND CORONARY ANGIOGRAPHY;  Surgeon: Nelva Bush, MD;  Location: [facility] CV LAB;  Service: Cardiovascular;  Laterality: N/A;  . UPPER GI ENDOSCOPY      Allergies Patient has no known allergies.  Social History Social History   Tobacco Use  . Smoking status: Former Smoker    Packs/day: 0.50    Years: 45.00    Pack years: 22.50    Types: Cigarettes    Last attempt to quit: 01/25/2018    Years since quitting: 0.8  . Smokeless tobacco: Never Used  . Tobacco comment: 1 ppd +40 years  Substance Use Topics  . Alcohol use: Not Currently    Alcohol/week: 0.0 standard drinks    Comment: weekly but last dose 1 month  . Drug use: No   Review of Systems Constitutional: Negative for fever. Cardiovascular: Negative for chest pain. Respiratory: Positive for shortness of breath Gastrointestinal: Negative for abdominal pain, vomiting and diarrhea. Musculoskeletal: Positive for low back pain Skin: Negative for rash. Neurological: Negative for headaches, focal weakness or numbness.  All systems negative/normal/unremarkable except as stated in the  HPI  ____________________________________________   PHYSICAL EXAM:  VITAL SIGNS: ED Triage Vitals [11/23/18 0741]  Enc Vitals Group     BP (!) 182/115     Pulse Rate (!) 162     Resp      Temp      Temp src      SpO2      Weight      Height      Head Circumference      Peak Flow      Pain Score      Pain Loc      Pain Edu?      Excl. in [place]?    Constitutional: Alert, anxious, mild to moderate distress Eyes: Conjunctivae are normal. Normal extraocular movements. ENT      Head: Normocephalic and atraumatic.      Nose: No congestion/rhinnorhea.      Mouth/Throat: Mucous membranes are moist.      Neck: No stridor. Cardiovascular: Rapid rate, regular rhythm. No murmurs, rubs, or gallops. Respiratory: Tachypnea with mostly clear breath sounds Gastrointestinal: Soft and nontender. Normal  bowel sounds Musculoskeletal: Nontender with normal range of motion in extremities. No lower extremity tenderness nor edema. Neurologic:  No gross focal neurologic deficits are appreciated.  Skin:  Skin is warm, dry and intact. No rash noted. Psychiatric: Patient is very anxious at this time ____________________________________________  EKG: Interpreted by me.  Initial EKG interpreted by me, SVT with a rate of 161 bpm, right bundle branch block, ST depressions are noted  Repeat EKG after 20 mg of IV Cardizem, sinus tachycardia with a rate of 134 bpm, right bundle branch block, ST depressions are noted, long QT  ____________________________________________  ED COURSE:  As part of my medical decision making, I reviewed the following data within the [place] History obtained from family if available, nursing notes, old chart and ekg, as well as notes from prior ED visits. Patient presented for multiple complaints likely indicating a panic attack but also having low back pain, we will assess with labs and imaging as indicated at this time.   Procedures  Tony Watson  was evaluated in Emergency Department on 11/23/2018 for the symptoms described in the history of present illness. He was evaluated in the context of the global COVID-19 pandemic, which necessitated consideration that the patient might be at risk for infection with the SARS-CoV-2 virus that causes COVID-19. Institutional protocols and algorithms that pertain to the evaluation of patients at risk for COVID-19 are in a state of rapid change based on information released by regulatory bodies including the CDC and federal and state organizations. These policies and algorithms were followed during the patient's care in the ED.  ____________________________________________   LABS (pertinent positives/negatives)  Labs Reviewed  CBC - Abnormal; Notable for the following components:      Result Value   WBC 26.9 (*)    RBC 3.59 (*)    Hemoglobin 12.2 (*)    HCT 37.1 (*)    MCV 103.3 (*)    All other components within normal limits  TROPONIN I - Abnormal; Notable for the following components:   Troponin I 0.03 (*)    All other components within normal limits  COMPREHENSIVE METABOLIC PANEL - Abnormal; Notable for the following components:   Potassium 3.4 (*)    Chloride 91 (*)    Glucose, Bld 240 (*)    All other components within normal limits  BLOOD GAS, VENOUS - Abnormal; Notable for the following components:   pO2, Ven 55.0 (*)    Bicarbonate 33.4 (*)    Acid-Base Excess 7.0 (*)    All other components within normal limits  BRAIN NATRIURETIC PEPTIDE - Abnormal; Notable for the following components:   B Natriuretic Peptide 101.0 (*)    All other components within normal limits  LACTIC ACID, PLASMA - Abnormal; Notable for the following components:   Lactic Acid, Venous 3.0 (*)    All other components within normal limits  SARS CORONAVIRUS 2 (HOSPITAL ORDER, [place])  CULTURE, BLOOD (ROUTINE X 2)  CULTURE, BLOOD (ROUTINE X 2)  URINALYSIS, COMPLETE (UACMP) WITH  MICROSCOPIC   CRITICAL CARE Performed by: Laurence Aly   Total critical care time: 60 minutes  Critical care time was exclusive of separately billable procedures and treating other patients.  Critical care was necessary to treat or prevent imminent or life-threatening deterioration.  Critical care was time spent personally by me on the following activities: development of treatment plan with patient and/or surrogate as well as nursing, discussions with consultants,  evaluation of patient's response to treatment, examination of patient, obtaining history from patient or surrogate, ordering and performing treatments and interventions, ordering and review of laboratory studies, ordering and review of radiographic studies, pulse oximetry and re-evaluation of patient's condition.  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 No evidence of acute superimposed process. ____________________________________________   DIFFERENTIAL DIAGNOSIS   Anxiety, panic attack, arrhythmia, MI, SVT, dissection, AAA  FINAL ASSESSMENT AND PLAN  Tachycardia, dyspnea,  presumed sepsis secondary to pneumonia   Plan: The patient had presented for shortness of breath as well as low back pain.  Initially thought to have a STEMI prehospital.  Patient was given Ativan and IV Cardizem on arrival.  Patient's labs revealed leukocytosis which I initially thought was from [facility].  He could certainly be septic and he does have a lactic acidosis.  Initial blood gas looks normal.  His chemistries, electrolytes and kidney functions look normal.  Early on in his presentation he had severely elevated blood pressures which concern me for possible CHF exacerbation.  We did give him a dose of IV Lasix and some topical nitroglycerin which we have subsequently discontinued.  We then sent for blood cultures  and gave IV broad-spectrum antibiotics. Patient's imaging initially regarding chest x-ray imaging did not reveal any acute process, CT angiogram was performed which revealed a left lower lobe pneumonia.  Patient's blood pressure is reassuring, however he has persistent tachycardia.   Laurence Aly, MD    Note: This note was generated in part or whole with voice recognition software. Voice recognition is usually quite accurate but there are transcription errors that can and very often do occur. I apologize for any typographical errors that were not detected and corrected.     Earleen Newport, MD 11/23/18 1041    Earleen Newport, MD 11/23/18 [phone]

## 2018-11-23 NOTE — ED Notes (Signed)
Dr Jimmye Norman notified of troponin 0.03 - no new orders Dr Jimmye Norman removed 1/2 inch of nitro paste from pt

## 2018-11-23 NOTE — ED Notes (Signed)
Pt sating at 88- O2 upped to 6L

## 2018-11-23 NOTE — Progress Notes (Signed)
Pharmacy Antibiotic Note  Tony Watson is a 68 y.o. male admitted on 11/23/2018 with sepsis.  Pharmacy has been consulted for vancomycin and cefepime dosing.  Pt received Zosyn x1 and vancomycin 1000 mg IV x1 in the ED.   Plan: Vancomycin 1250 mg IV Q 12 hrs. Goal AUC 400-550. Expected AUC: 512.4 SCr used: 0.89 Cmin: 14.6  Cefepime 2 g IV q8h    Height: 5\' 11"  (180.3 cm) Weight: 195 lb (88.5 kg) IBW/kg (Calculated) : 75.3  Temp (24hrs), Avg:99.9 F (37.7 C), Min:99.9 F (37.7 C), Max:99.9 F (37.7 C)  Recent Labs  Lab 11/23/18 0735 11/23/18 0915  WBC 26.9*  --   CREATININE 0.89  --   LATICACIDVEN  --  3.0*    Estimated Creatinine Clearance: 87 mL/min (by C-G formula based on SCr of 0.89 mg/dL).    No Known Allergies  Antimicrobials this admission: Zosyn x1 4/15 Vancomycin 4/15 >> Cefepime 4/15 >>  Dose adjustments this admission:   Microbiology results: 4/15 BCx: sent SARS-CoV 2 negative  Thank you for allowing pharmacy to be a part of this patient's care.  Rocky Morel 11/23/2018 1:18 PM

## 2018-11-23 NOTE — ED Notes (Signed)
Dr Williams notified of Troponin 0.03- no new orders at this time 

## 2018-11-23 NOTE — ED Notes (Signed)
Daughter- Tillie Fantasia [phone]

## 2018-11-23 NOTE — ED Notes (Addendum)
Provider increased [place] to 5L pt at 87%

## 2018-11-23 NOTE — ED Notes (Signed)
Pt attempting to climb out of bed - pulling oxygen off and removing monitoring equipment Repositioned in bed - water given - oriented to room and call bell

## 2018-11-23 NOTE — Telephone Encounter (Signed)
I saw admit notes

## 2018-11-23 NOTE — ED Notes (Addendum)
Heart rate 163 prior to cardiazem

## 2018-11-23 NOTE — ED Notes (Signed)
Pt continues to pull of monitoring equipment, attempts to climb out of bed, and removes oxygen

## 2018-11-24 ENCOUNTER — Inpatient Hospital Stay: Payer: Medicare HMO

## 2018-11-24 ENCOUNTER — Inpatient Hospital Stay: Payer: Self-pay

## 2018-11-24 DIAGNOSIS — J9621 Acute and chronic respiratory failure with hypoxia: Secondary | ICD-10-CM

## 2018-11-24 LAB — BASIC METABOLIC PANEL
Anion gap: 14 (ref 5–15)
BUN: 30 mg/dL — ABNORMAL HIGH (ref 8–23)
CO2: 27 mmol/L (ref 22–32)
Calcium: 7.8 mg/dL — ABNORMAL LOW (ref 8.9–10.3)
Chloride: 95 mmol/L — ABNORMAL LOW (ref 98–111)
Creatinine, Ser: 1.57 mg/dL — ABNORMAL HIGH (ref 0.61–1.24)
GFR calc Af Amer: 52 mL/min — ABNORMAL LOW (ref >60)
GFR calc non Af Amer: 45 mL/min — ABNORMAL LOW (ref >60)
Glucose, Bld: 164 mg/dL — ABNORMAL HIGH (ref 70–99)
Potassium: 4 mmol/L (ref 3.5–5.1)
Sodium: 136 mmol/L (ref 135–145)

## 2018-11-24 LAB — EXPECTORATED SPUTUM ASSESSMENT W GRAM STAIN, RFLX TO RESP C: Special Requests: NORMAL

## 2018-11-24 LAB — GLUCOSE, CAPILLARY
Glucose-Capillary: 172 mg/dL — ABNORMAL HIGH (ref 70–99)
Glucose-Capillary: 202 mg/dL — ABNORMAL HIGH (ref 70–99)
Glucose-Capillary: 242 mg/dL — ABNORMAL HIGH (ref 70–99)
Glucose-Capillary: 267 mg/dL — ABNORMAL HIGH (ref 70–99)

## 2018-11-24 LAB — CBC
HCT: 30.5 % — ABNORMAL LOW (ref 39.0–52.0)
Hemoglobin: 9.8 g/dL — ABNORMAL LOW (ref 13.0–17.0)
MCH: 33.8 pg (ref 26.0–34.0)
MCHC: 32.1 g/dL (ref 30.0–36.0)
MCV: 105.2 fL — ABNORMAL HIGH (ref 80.0–100.0)
Platelets: 154 10*3/uL (ref 150–400)
RBC: 2.9 MIL/uL — ABNORMAL LOW (ref 4.22–5.81)
RDW: 15.5 % (ref 11.5–15.5)
WBC: 67.1 10*3/uL (ref 4.0–10.5)
nRBC: 0 % (ref 0.0–0.2)

## 2018-11-24 LAB — STREP PNEUMONIAE URINARY ANTIGEN: Strep Pneumo Urinary Antigen: NEGATIVE

## 2018-11-24 LAB — TROPONIN I
Troponin I: 0.98 ng/mL (ref <0.03)
Troponin I: 0.78 ng/mL (ref <0.03)

## 2018-11-24 LAB — LACTIC ACID, PLASMA
Lactic Acid, Venous: 3.5 mmol/L (ref 0.5–1.9)
Lactic Acid, Venous: 3.5 mmol/L (ref 0.5–1.9)
Lactic Acid, Venous: 5 mmol/L (ref 0.5–1.9)

## 2018-11-24 LAB — PROCALCITONIN: Procalcitonin: 23.06 ng/mL

## 2018-11-24 LAB — BRAIN NATRIURETIC PEPTIDE: B Natriuretic Peptide: 520 pg/mL — ABNORMAL HIGH (ref 0.0–100.0)

## 2018-11-24 MED ORDER — ARFORMOTEROL TARTRATE 15 MCG/2ML IN NEBU
15.0000 ug | INHALATION_SOLUTION | Freq: Two times a day (BID) | RESPIRATORY_TRACT | Status: DC
  Administered 2018-11-24 – 2018-11-28 (×8): 15 ug via RESPIRATORY_TRACT
  Filled 2018-11-24 (×10): qty 2

## 2018-11-24 MED ORDER — VANCOMYCIN HCL 10 G IV SOLR
1250.0000 mg | Freq: Two times a day (BID) | INTRAVENOUS | Status: DC
  Administered 2018-11-24: 1250 mg via INTRAVENOUS
  Filled 2018-11-24 (×2): qty 1250

## 2018-11-24 MED ORDER — LOPERAMIDE HCL 2 MG PO CAPS
2.0000 mg | ORAL_CAPSULE | Freq: Once | ORAL | Status: AC
  Administered 2018-11-24: 2 mg via ORAL
  Filled 2018-11-24: qty 1

## 2018-11-24 MED ORDER — SODIUM CHLORIDE 0.9 % IV BOLUS
250.0000 mL | Freq: Once | INTRAVENOUS | Status: AC
  Administered 2018-11-24: 01:00:00 250 mL via INTRAVENOUS

## 2018-11-24 MED ORDER — METRONIDAZOLE IN NACL 5-0.79 MG/ML-% IV SOLN
500.0000 mg | Freq: Three times a day (TID) | INTRAVENOUS | Status: DC
  Administered 2018-11-24: 500 mg via INTRAVENOUS
  Filled 2018-11-24 (×3): qty 100

## 2018-11-24 MED ORDER — LORAZEPAM 2 MG/ML IJ SOLN
0.5000 mg | Freq: Once | INTRAMUSCULAR | Status: AC
  Administered 2018-11-24: 07:00:00 0.5 mg via INTRAVENOUS
  Filled 2018-11-24: qty 1

## 2018-11-24 MED ORDER — SODIUM CHLORIDE 0.9 % IV BOLUS
250.0000 mL | Freq: Once | INTRAVENOUS | Status: AC
  Administered 2018-11-24: 13:00:00 250 mL via INTRAVENOUS

## 2018-11-24 MED ORDER — METHYLPREDNISOLONE SODIUM SUCC 40 MG IJ SOLR
40.0000 mg | Freq: Two times a day (BID) | INTRAMUSCULAR | Status: DC
  Administered 2018-11-24 – 2018-11-28 (×8): 40 mg via INTRAVENOUS
  Filled 2018-11-24 (×8): qty 1

## 2018-11-24 MED ORDER — SODIUM CHLORIDE 0.9 % IV SOLN
0.0000 ug/min | INTRAVENOUS | Status: DC
  Administered 2018-11-24: 03:00:00 20 ug/min via INTRAVENOUS
  Filled 2018-11-24: qty 10

## 2018-11-24 MED ORDER — SODIUM CHLORIDE 0.9 % IV SOLN
2.0000 g | Freq: Two times a day (BID) | INTRAVENOUS | Status: DC
  Administered 2018-11-24: 2 g via INTRAVENOUS
  Filled 2018-11-24 (×3): qty 2

## 2018-11-24 NOTE — Progress Notes (Signed)
Attempted to place Right IJ CVC.  Able to cannulate vessel under ultrasound guidance, but unable to pass guidewire, and also pt with episodes of coughing, therefore attempt aborted.  No obvious complications, remains hemodynamically stable.  Follow up CXR pending.  Discussed with Dr. Alva Garnet, will place order for PICC line.   Darel Hong, AGACNP-BC [facility] Pager: [phone] Cell: [phone]

## 2018-11-24 NOTE — Progress Notes (Addendum)
Name: Tony Watson MRN: [id number] DOB: [birth year]    ADMISSION DATE:  11/23/2018  BRIEF PATIENT DESCRIPTION 68 yo male admitted with acute on chronic hypoxic respiratory failure secondary to AECOPD and LLL pneumonia requiring HFNC  SIGNIFICANT EVENTS/STUDIES:  04/15-Pt admitted to the stepdown unit  04/15-CTA Chest revealed dense consolidative infiltrate in the posterolateral aspect of the left lower lobe consistent pneumonia. No evidence of aortic dissection. Extensive aortic Atherosclerosis (ICD10-I70.0). Colonic diverticulosis. Multilevel severe lumbar spinal stenosis   CULTURES: Blood x2 04/15>>negative  COVID-19 04/15>>negative  Urine 04/15>> Sputum 04/16>>  PAST MEDICAL HISTORY :   has a past medical history of Arthritis, CAD (coronary artery disease), Chronic combined systolic (congestive) and diastolic (congestive) heart failure (HCC), Chronic respiratory failure with hypoxia ([place]), CML (chronic myelocytic leukemia) ([place]), COPD (chronic obstructive pulmonary disease) ([place]), Diabetes mellitus without complication ([place]), Diabetic autonomic neuropathy associated with type 2 diabetes mellitus ([place]) (03/27/2015), Diverticulosis (2002), GERD (gastroesophageal reflux disease), HTN (hypertension), Hyperlipidemia, ILD (interstitial lung disease) ([place]), Ischemic cardiomyopathy, Medical non-compliance, Smoker, Spinal stenosis, TIA (transient ischemic attack), and Ulcer.  has a past surgical history that includes open heart surgery (2004); Cardiac catheterization (11/99); Coronary angioplasty; Eye surgery; Cataract extraction w/PHACO (Right, 11/24/2016); Cataract extraction w/PHACO (Left, 12/29/2016); Back surgery; Flexible bronchoscopy (N/A, 05/19/2017); Bronchoscopy; Coronary artery bypass graft; Upper gi endoscopy; Colonoscopy; Colonoscopy with propofol (N/A, 05/25/2017); Esophagogastroduodenoscopy (egd) with propofol (N/A, 05/25/2017); and RIGHT/LEFT HEART CATH AND CORONARY ANGIOGRAPHY (N/A,  05/03/2018).  SUBJECTIVE:  Pt c/o shortness of breath   VITAL SIGNS: Temp:  [97.8 F (36.6 C)-98.3 F (36.8 C)] 97.9 F (36.6 C) (04/16 0200) Pulse Rate:  [63-136] 96 (04/16 0645) Resp:  [19-32] 23 (04/16 0645) BP: (63-148)/(41-129) 114/78 (04/16 0645) SpO2:  [92 %-100 %] 94 % (04/16 0645) Weight:  [88.3 kg] 88.3 kg (04/15 1439)  PHYSICAL EXAMINATION: General: acutely ill appearing male, in mild respiratory distress  Neuro: alert and oriented, follows commands  HEENT: supple, mild JVD  Cardiovascular: sinus tach, no R/G Lungs: rhonchi throughout, even, slightly labored  Abdomen: +BS x4, soft, non tender, non distended  Musculoskeletal: 1+ bilateral lower extremity edema Skin: scattered ecchymosis   Recent Labs  Lab 11/23/18 0735 11/24/18 0449  NA 137 136  K 3.4* 4.0  CL 91* 95*  CO2 31 27  BUN 17 30*  CREATININE 0.89 1.57*  GLUCOSE 240* 164*   Recent Labs  Lab 11/23/18 0735 11/24/18 0449  HGB 12.2* 9.8*  HCT 37.1* 30.5*  WBC 26.9* 67.1*  PLT 200 154   Dg Chest Port 1 View  Result Date: 11/24/2018 CLINICAL DATA:  Pneumothorax. EXAM: PORTABLE CHEST 1 VIEW COMPARISON:  Radiograph of same day. FINDINGS: Stable cardiomediastinal silhouette. Sternotomy wires are noted. No pneumothorax or pleural effusion is noted. Stable diffuse interstitial densities are noted throughout both lungs. Stable left lower lobe opacity is noted. Bony thorax is unremarkable. IMPRESSION: Stable diffuse interstitial densities are noted throughout both lungs most likely representing chronic interstitial lung disease or scarring. Stable asymmetric opacity is seen in left lung base which may represent acute inflammation. Electronically Signed   By: Marijo Conception M.D.   On: 11/24/2018 09:02   Dg Chest Port 1 View  Result Date: 11/24/2018 CLINICAL DATA:  Acute respiratory failure. EXAM: PORTABLE CHEST 1 VIEW COMPARISON:  CT chest 11/23/2018.  One-view chest x-ray 11/23/2018. FINDINGS: From the  heart size is upper limits of normal. Persistent left lower lobe consolidation is present. Chronic interstitial coarsening is noted. There is no significant interval  change. IMPRESSION: 1. Stable left lower lobe airspace disease, likely infection. 2. Chronic interstitial coarsening without other significant consolidated disease. Electronically Signed   By: San Morelle M.D.   On: 11/24/2018 06:42   Dg Chest Port 1 View  Result Date: 11/23/2018 CLINICAL DATA:  Shaking with low back pain and dry heaving. Shortness of breath. EXAM: PORTABLE CHEST 1 VIEW COMPARISON:  05/08/2018 and 05/01/2018 radiographs.  CT 05/13/2017. FINDINGS: 0734 hours. Two views obtained. The heart size and mediastinal contours are stable status post median sternotomy. There is stable chronic interstitial lung disease without evidence of superimposed edema, confluent airspace opacity or pleural effusion. There is no pneumothorax. Telemetry leads overlie the chest. 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 No evidence of acute superimposed process. Electronically Signed   By: Richardean Sale M.D.   On: 11/23/2018 08:23   Ct Angio Chest/abd/pel For Dissection W And/or Wo Contrast  Result Date: 11/23/2018 CLINICAL DATA:  Acute chest and upper back pain and shortness of breath. EXAM: CT ANGIOGRAPHY CHEST, ABDOMEN AND PELVIS TECHNIQUE: Multidetector CT imaging through the chest, abdomen and pelvis was performed using the standard protocol during bolus administration of intravenous contrast. Multiplanar reconstructed images and MIPs were obtained and reviewed to evaluate the vascular anatomy. CONTRAST:  175m ISOVUE-370 IOPAMIDOL (ISOVUE-370) INJECTION 76% COMPARISON:  Chest x-ray dated 11/23/2018 and chest CT dated 05/13/2017 and CT scan of the abdomen and pelvis dated 02/24/2017 FINDINGS: CTA CHEST FINDINGS Cardiovascular: There is extensive coronary artery  calcification. Minimal aortic atherosclerosis. Heart size is normal. No pericardial effusion. No evidence of thoracic aortic dissection. Mediastinum/Nodes: Chronic lobulation of the thyroid gland, unchanged. Trachea and esophagus are normal. No significant hilar or mediastinal adenopathy. Lungs/Pleura: There is a dense consolidative infiltrate in the posterolateral aspect of the left lower lobe. This is superimposed on chronic interstitial and obstructive lung disease. No effusions. Musculoskeletal: Previous median sternotomy. No acute bone abnormality. Review of the MIP images confirms the above findings. CTA ABDOMEN AND PELVIS FINDINGS VASCULAR Aorta: Aortic atherosclerosis. Celiac: Widely patent. SMA: Widely patent. Renals: There are at least 4 widely patent right renal arteries. There is a single widely patent left renal artery. IMA: IMA is patent.  Dense calcification at the origin. Inflow: Patent without evidence of aneurysm, dissection, vasculitis or significant stenosis. Veins: No obvious venous abnormality within the limitations of this arterial phase study. Review of the MIP images confirms the above findings. NON-VASCULAR Hepatobiliary: No focal liver abnormality is seen. No gallstones, gallbladder wall thickening, or biliary dilatation. Pancreas: Unremarkable. No pancreatic ductal dilatation or surrounding inflammatory changes. Spleen: Normal in size without focal abnormality. Adrenals/Urinary Tract: Adrenal glands are unremarkable. Kidneys are normal, without renal calculi, focal lesion, or hydronephrosis. Bladder is unremarkable. Stomach/Bowel: Stomach is within normal limits. Appendix appears normal. No evidence of bowel wall thickening, distention, or inflammatory changes. Scattered diverticula in the descending and sigmoid portions of the colon. Lymphatic: No adenopathy. Reproductive: Prostate is unremarkable. Other: No abdominal wall hernia or abnormality. No abdominopelvic ascites.  Musculoskeletal: No acute abnormality. Severe spinal stenosis at L2-3, L3-4, and L4-5. Diffuse degenerative disc and joint disease in the lumbar spine. Review of the MIP images confirms the above findings. IMPRESSION: 1. Dense consolidative infiltrate in the posterolateral aspect of the left lower lobe consistent pneumonia. 2. No evidence of aortic dissection. 3.  Extensive aortic Atherosclerosis (ICD10-I70.0). 4. Colonic diverticulosis. 5. Multilevel severe lumbar spinal stenosis. Electronically Signed   By: JLorriane ShireM.D.  On: 11/23/2018 10:39   [place] Ekg Site Rite  Result Date: 11/24/2018 If Site Rite image not attached, placement could not be confirmed due to current cardiac rhythm.   ASSESSMENT / PLAN:  Acute on chronic hypoxic respiratory failure secondary to AECOPD and LLL pneumonia  Hx: Chronic home O2 _0  and Respiratory bronchiolitis  Supplemental O2 for dyspnea and/or hypoxia  Scheduled and prn bronchodilator therapy Nebulized steroids  Continue chest physiotherapy and mucinex  HTN  Supraventricular tachycardia-resolved Hypotension secondary to sepsis  Hx: Chronic combined systolic diastolic CHF, HLD, Ischemic cardiomyopathy  Continuous telemetry monitoring  Continue outpatient cardiac medications Prn neo-synephrine to maintain map >65   Acute renal failure secondary to hypotension  Trend BMP Replace electrolytes as indicated  Monitor UOP  Avoid nephrotoxic medications   LLL pneumonia  Hx: CML Trend WBC and monitor fever curve  Trend PCT and lactic acid  Follow cultures  Continue cefepime and discontinue vancomycin   Anemia without obvious acute blood loss  VTE px: subq lovenox  Trend CBC  Monitor for s/sx of bleeding and transfuse for hgb <7  Diabetes Mellitus  [facility]  Marda Stalker, [place] Pager [phone] (please enter 7 digits) PCCM Consult Pager [phone] (please enter 7 digits)

## 2018-11-24 NOTE — Progress Notes (Signed)
Per Margreta Journey, RN patient may not need PICC at present.  Will clarify with MD.   Carolee Rota, RN [facility]

## 2018-11-24 NOTE — Progress Notes (Addendum)
Pharmacy Antibiotic Note  Tony Watson is a 68 y.o. male admitted on 11/23/2018 with sepsis and chest pain. Sepsis possibly secondary to pneumonia. PMH significant for leukemia, COPD, interstitial lung disease. Pharmacy has been consulted for cefepime dosing.  Plan: Vancomycin discontinued per CCM discussion.   Decreased Cefepime from 2 g IV q8h to q12h  Height: 5\' 11"  (180.3 cm) Weight: 194 lb 10.7 oz (88.3 kg) IBW/kg (Calculated) : 75.3  Temp (24hrs), Avg:98 F (36.7 C), Min:97.8 F (36.6 C), Max:98.3 F (36.8 C)  Recent Labs  Lab 11/23/18 0735  11/23/18 1219 11/23/18 1452 11/23/18 2059 11/24/18 0003 11/24/18 0449  WBC 26.9*  --   --   --   --   --  67.1*  CREATININE 0.89  --   --   --   --   --  1.57*  LATICACIDVEN  --    < > 3.6* 3.8* 4.5* 3.5* 3.5*   < > = values in this interval not displayed.    Estimated Creatinine Clearance: 49.3 mL/min (A) (by C-G formula based on SCr of 1.57 mg/dL (H)).    No Known Allergies  Antimicrobials this admission: Zosyn x1 4/15 Vancomycin 4/15 >> 4/16 Cefepime 4/15 >> Flagyl 4/16 >> 4/16  Dose adjustments this admission: 4/16 Decreased Cefepime from 2 g IV q8h to q12h  Microbiology results: 4/15 BCx: NGTD SARS-CoV 2 negative MRSA PCR : negative   Thank you for allowing pharmacy to be a part of this patient's care.   Paticia Stack, PharmD Pharmacy Resident  11/24/2018 12:05 PM

## 2018-11-24 NOTE — Progress Notes (Signed)
Pharmacy Antibiotic Note  Tony Watson is a 68 y.o. male admitted on 11/23/2018 with sepsis.  Pharmacy has been consulted for vancomycin and cefepime dosing.  Pt received Zosyn x1 and vancomycin 1000 mg IV x1 in the ED @ 1000  Plan: Will restart Vancomycin 1250 mg IV Q 12 hrs. Goal AUC 400-550. Expected AUC: 513 SCr used: 0.89 Cmin: 14.6  Continue Cefepime 2 g IV q8h    Height: 5\' 11"  (180.3 cm) Weight: 194 lb 10.7 oz (88.3 kg) IBW/kg (Calculated) : 75.3  Temp (24hrs), Avg:98.4 F (36.9 C), Min:97.8 F (36.6 C), Max:99.9 F (37.7 C)  Recent Labs  Lab 11/23/18 0735 11/23/18 0915 11/23/18 1219 11/23/18 1452 11/23/18 2059 11/24/18 0003  WBC 26.9*  --   --   --   --   --   CREATININE 0.89  --   --   --   --   --   LATICACIDVEN  --  3.0* 3.6* 3.8* 4.5* 3.5*    Estimated Creatinine Clearance: 87 mL/min (by C-G formula based on SCr of 0.89 mg/dL).    No Known Allergies  Antimicrobials this admission: Zosyn x1 4/15 Vancomycin 4/15 >> Cefepime 4/15 >>  Dose adjustments this admission:   Microbiology results: 4/15 BCx: sent SARS-CoV 2 negative MRSA PCR : negative   Thank you for allowing pharmacy to be a part of this patient's care.  Pernell Dupre, PharmD, BCPS Clinical Pharmacist 11/24/2018 2:54 AM

## 2018-11-24 NOTE — TOC Progression Note (Signed)
Transition of Care [facility]) - Progression Note    Patient Details  Name: Tony Watson MRN: [id number] Date of Birth: 11-30-1950  [facility] CM/SW Contact  Tony Rafter, RN Phone Number: 11/24/2018, 4:04 PM  Clinical Narrative:   Patient is from home with wife, Tony Watson.  Current with PCP.  Obtains medications without difficulty through [facility] mail order.  He uses 4L oxygen at home; obtains through [employer].  Has used [place] in the Past but is refusing at this time during the COVID-19 outbreak.  He and his wife do not want people coming to the house.  His wife will transport him home at discharge.      Expected Discharge Plan: Home/Self Care Barriers to Discharge: Continued Medical Work up  Expected Discharge Plan and Services Expected Discharge Plan: Home/Self Care   Discharge Planning Services: CM Consult   Living arrangements for the past 2 months: Single Family Home                           Social Determinants of Health (SDOH) Interventions    Readmission Risk Interventions Readmission Risk Prevention Plan 11/24/2018 05/08/2018  Transportation Screening Complete Complete  Medication Review Press photographer) Complete Complete  PCP or Specialist appointment within 3-5 days of discharge Complete Complete  HRI or Home Care Consult Complete Complete  SW Recovery Care/Counseling Consult Patient refused Complete  Palliative Care Screening Not Applicable Not Complete  Medication Reconcilation (Pharmacy) - Complete  [place] Not Applicable Not Complete  Some recent data might be hidden

## 2018-11-24 NOTE — Progress Notes (Signed)
Patient ID: Tony Watson, male   DOB: [birth year], 68 y.o.   MRN: [id number]  [facility] PROGRESS NOTE  Tony Watson VQM:[id number] DOB: Oct 09, 1950 DOA: 11/23/2018 PCP: Owens Loffler, MD  HPI/Subjective: Patient feeling better today than yesterday.  Some cough but unable to bring it up.  Some shortness of breath.  He asked me if his wife can bring in his teeth.  Objective: Vitals:   11/24/18 0630 11/24/18 0645  BP: (!) 119/59 114/78  Pulse: 96 96  Resp: (!) 24 (!) 23  Temp:    SpO2: 96% 94%    Filed Weights   11/23/18 0753 11/23/18 1439  Weight: 88.5 kg 88.3 kg    ROS: Review of Systems  Constitutional: Negative for chills and fever.  Eyes: Negative for blurred vision.  Respiratory: Positive for cough and shortness of breath.   Cardiovascular: Negative for chest pain.  Gastrointestinal: Negative for abdominal pain, constipation, diarrhea, nausea and vomiting.  Genitourinary: Negative for dysuria.  Musculoskeletal: Negative for joint pain.  Neurological: Negative for dizziness and headaches.   Exam: Physical Exam  Constitutional: He is oriented to person, place, and time.  HENT:  Nose: No mucosal edema.  Mouth/Throat: No oropharyngeal exudate or posterior oropharyngeal edema.  Eyes: Pupils are equal, round, and reactive to light. Conjunctivae, EOM and lids are normal.  Neck: No JVD present. Carotid bruit is not present. No edema present. No thyroid mass and no thyromegaly present.  Cardiovascular: S1 normal and S2 normal. Exam reveals no gallop.  No murmur heard. Pulses:      Dorsalis pedis pulses are 2+ on the right side and 2+ on the left side.  Respiratory: No respiratory distress. He has decreased breath sounds in the right middle field, the right lower field, the left middle field and the left lower field. He has no wheezes. He has rhonchi in the right middle field, the right lower field, the left middle field and the left lower field. He has no rales.   GI: Soft. Bowel sounds are normal. There is no abdominal tenderness.  Musculoskeletal:     Right ankle: He exhibits no swelling.     Left ankle: He exhibits no swelling.  Lymphadenopathy:    He has no cervical adenopathy.  Neurological: He is alert and oriented to person, place, and time. No cranial nerve deficit.  Skin: Skin is warm. Nails show no clubbing.  Bruising upper extremities  Psychiatric: He has a normal mood and affect.      Data Reviewed: Basic Metabolic Panel: Recent Labs  Lab 11/23/18 0735 11/24/18 0449  NA 137 136  K 3.4* 4.0  CL 91* 95*  CO2 31 27  GLUCOSE 240* 164*  BUN 17 30*  CREATININE 0.89 1.57*  CALCIUM 9.1 7.8*   Liver Function Tests: Recent Labs  Lab 11/23/18 0735  AST 29  ALT 39  ALKPHOS 47  BILITOT 1.0  PROT 8.1  ALBUMIN 4.0   CBC: Recent Labs  Lab 11/23/18 0735 11/24/18 0449  WBC 26.9* 67.1*  HGB 12.2* 9.8*  HCT 37.1* 30.5*  MCV 103.3* 105.2*  PLT 200 154   Cardiac Enzymes: Recent Labs  Lab 11/23/18 0735 11/24/18 0449 11/24/18 1120  TROPONINI 0.03* 0.98* 0.78*   BNP (last 3 results) Recent Labs    05/01/18 1024 11/23/18 0735 11/24/18 0449  BNP 1,279.0* 101.0* 520.0*    ProBNP (last 3 results) No results for input(s): PROBNP in the last 8760 hours.  CBG: Recent Labs  Lab 11/23/18  1433 11/23/18 1551 11/23/18 2143 11/24/18 0752 11/24/18 1115  GLUCAP 141* 137* 126* 172* 202*    Recent Results (from the past 240 hour(s))  SARS Coronavirus 2 [facility] order, Performed in [place] hospital lab)     Status: None   Collection Time: 11/23/18  8:52 AM  Result Value Ref Range Status   SARS Coronavirus 2 NEGATIVE NEGATIVE Final    Comment: (NOTE) If result is NEGATIVE SARS-CoV-2 target nucleic acids are NOT DETECTED. The SARS-CoV-2 RNA is generally detectable in upper and lower  respiratory specimens during the acute phase of infection. The lowest  concentration of SARS-CoV-2 viral copies this assay can  detect is 250  copies / mL. A negative result does not preclude SARS-CoV-2 infection  and should not be used as the sole basis for treatment or other  patient management decisions.  A negative result may occur with  improper specimen collection / handling, submission of specimen other  than nasopharyngeal swab, presence of viral mutation(s) within the  areas targeted by this assay, and inadequate number of viral copies  (<250 copies / mL). A negative result must be combined with clinical  observations, patient history, and epidemiological information. If result is POSITIVE SARS-CoV-2 target nucleic acids are DETECTED. The SARS-CoV-2 RNA is generally detectable in upper and lower  respiratory specimens dur ing the acute phase of infection.  Positive  results are indicative of active infection with SARS-CoV-2.  Clinical  correlation with patient history and other diagnostic information is  necessary to determine patient infection status.  Positive results do  not rule out bacterial infection or co-infection with other viruses. If result is PRESUMPTIVE POSTIVE SARS-CoV-2 nucleic acids MAY BE PRESENT.   A presumptive positive result was obtained on the submitted specimen  and confirmed on repeat testing.  While 2019 novel coronavirus  (SARS-CoV-2) nucleic acids may be present in the submitted sample  additional confirmatory testing may be necessary for epidemiological  and / or clinical management purposes  to differentiate between  SARS-CoV-2 and other Sarbecovirus currently known to infect humans.  If clinically indicated additional testing with an alternate test  methodology [phone]) is advised. The SARS-CoV-2 RNA is generally  detectable in upper and lower respiratory sp ecimens during the acute  phase of infection. The expected result is Negative. Fact Sheet for Patients:  StrictlyIdeas.no Fact Sheet for Healthcare  Providers: BankingDealers.co.za This test is not yet approved or cleared by the [place] FDA and has been authorized for detection and/or diagnosis of SARS-CoV-2 by FDA under an Emergency Use Authorization (EUA).  This EUA will remain in effect (meaning this test can be used) for the duration of the COVID-19 declaration under Section 564(b)(1) of the Act, 21 U.S.C. section 360bbb-3(b)(1), unless the authorization is terminated or revoked sooner. Performed at [facility], [place] [address] [place], [place] [address]   Blood culture (routine x 2)     Status: None (Preliminary result)   Collection Time: 11/23/18 10:20 AM  Result Value Ref Range Status   Specimen Description BLOOD RIGHT ANTECUBITAL  Final   Special Requests   Final    BOTTLES DRAWN AEROBIC AND ANAEROBIC Blood Culture adequate volume   Culture   Final    NO GROWTH < 24 HOURS Performed at [facility], [address], [place], [place] [address]    Report Status PENDING  Incomplete  Blood culture (routine x 2)     Status: None (Preliminary result)   Collection Time: 11/23/18  10:20 AM  Result Value Ref Range Status   Specimen Description BLOOD BLOOD LEFT FOREARM  Final   Special Requests   Final    BOTTLES DRAWN AEROBIC AND ANAEROBIC Blood Culture adequate volume   Culture   Final    NO GROWTH < 24 HOURS Performed at [facility], [address], [place], [place] [address]    Report Status PENDING  Incomplete  MRSA PCR Screening     Status: None   Collection Time: 11/23/18  2:41 PM  Result Value Ref Range Status   MRSA by PCR NEGATIVE NEGATIVE Final    Comment:        The GeneXpert MRSA Assay (FDA approved for NASAL specimens only), is one component of a comprehensive MRSA colonization surveillance program. It is not intended to diagnose MRSA infection nor to guide or monitor treatment for MRSA infections. Performed at [facility], [address] [place], [place] [address]   Expectorated sputum assessment w rflx to resp cult     Status: None   Collection Time: 11/24/18 11:39 AM  Result Value Ref Range Status   Specimen Description EXPECTORATED SPUTUM  Final   Special Requests Normal  Final   Sputum evaluation   Final    THIS SPECIMEN IS ACCEPTABLE FOR SPUTUM CULTURE Performed at [facility] [address], [place], [place] [address]    Report Status 11/24/2018 FINAL  Final     Studies: Dg Chest Port 1 View  Result Date: 11/24/2018 CLINICAL DATA:  Pneumothorax. EXAM: PORTABLE CHEST 1 VIEW COMPARISON:  Radiograph of same day. FINDINGS: Stable cardiomediastinal silhouette. Sternotomy wires are noted. No pneumothorax or pleural effusion is noted. Stable diffuse interstitial densities are noted throughout both lungs. Stable left lower lobe opacity is noted. Bony thorax is unremarkable. IMPRESSION: Stable diffuse interstitial densities are noted throughout both lungs most likely representing chronic interstitial lung disease or scarring. Stable asymmetric opacity is seen in left lung base which may represent acute inflammation. Electronically Signed   By: Marijo Conception M.D.   On: 11/24/2018 09:02   Dg Chest Port 1 View  Result Date: 11/24/2018 CLINICAL DATA:  Acute respiratory failure. EXAM: PORTABLE CHEST 1 VIEW COMPARISON:  CT chest 11/23/2018.  One-view chest x-ray 11/23/2018. FINDINGS: From the heart size is upper limits of normal. Persistent left lower lobe consolidation is present. Chronic interstitial coarsening is noted. There is no significant interval change. IMPRESSION: 1. Stable left lower lobe airspace disease, likely infection. 2. Chronic interstitial coarsening without other significant consolidated disease. Electronically Signed   By: San Morelle M.D.   On: 11/24/2018 06:42   Dg Chest Port 1 View  Result Date: 11/23/2018 CLINICAL DATA:  Shaking with low back pain and dry heaving. Shortness of breath.  EXAM: PORTABLE CHEST 1 VIEW COMPARISON:  05/08/2018 and 05/01/2018 radiographs.  CT 05/13/2017. FINDINGS: 0734 hours. Two views obtained. The heart size and mediastinal contours are stable status post median sternotomy. There is stable chronic interstitial lung disease without evidence of superimposed edema, confluent airspace opacity or pleural effusion. There is no pneumothorax. Telemetry leads overlie the chest. 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 No evidence of acute superimposed process. Electronically Signed   By: Richardean Sale M.D.   On: 11/23/2018 08:23   Ct Angio Chest/abd/pel For Dissection W And/or Wo Contrast  Result Date: 11/23/2018 CLINICAL DATA:  Acute chest and upper back pain and shortness of breath. EXAM:  CT ANGIOGRAPHY CHEST, ABDOMEN AND PELVIS TECHNIQUE: Multidetector CT imaging through the chest, abdomen and pelvis was performed using the standard protocol during bolus administration of intravenous contrast. Multiplanar reconstructed images and MIPs were obtained and reviewed to evaluate the vascular anatomy. CONTRAST:  146mL ISOVUE-370 IOPAMIDOL (ISOVUE-370) INJECTION 76% COMPARISON:  Chest x-ray dated 11/23/2018 and chest CT dated 05/13/2017 and CT scan of the abdomen and pelvis dated 02/24/2017 FINDINGS: CTA CHEST FINDINGS Cardiovascular: There is extensive coronary artery calcification. Minimal aortic atherosclerosis. Heart size is normal. No pericardial effusion. No evidence of thoracic aortic dissection. Mediastinum/Nodes: Chronic lobulation of the thyroid gland, unchanged. Trachea and esophagus are normal. No significant hilar or mediastinal adenopathy. Lungs/Pleura: There is a dense consolidative infiltrate in the posterolateral aspect of the left lower lobe. This is superimposed on chronic interstitial and obstructive lung disease. No effusions. Musculoskeletal: Previous median sternotomy. No  acute bone abnormality. Review of the MIP images confirms the above findings. CTA ABDOMEN AND PELVIS FINDINGS VASCULAR Aorta: Aortic atherosclerosis. Celiac: Widely patent. SMA: Widely patent. Renals: There are at least 4 widely patent right renal arteries. There is a single widely patent left renal artery. IMA: IMA is patent.  Dense calcification at the origin. Inflow: Patent without evidence of aneurysm, dissection, vasculitis or significant stenosis. Veins: No obvious venous abnormality within the limitations of this arterial phase study. Review of the MIP images confirms the above findings. NON-VASCULAR Hepatobiliary: No focal liver abnormality is seen. No gallstones, gallbladder wall thickening, or biliary dilatation. Pancreas: Unremarkable. No pancreatic ductal dilatation or surrounding inflammatory changes. Spleen: Normal in size without focal abnormality. Adrenals/Urinary Tract: Adrenal glands are unremarkable. Kidneys are normal, without renal calculi, focal lesion, or hydronephrosis. Bladder is unremarkable. Stomach/Bowel: Stomach is within normal limits. Appendix appears normal. No evidence of bowel wall thickening, distention, or inflammatory changes. Scattered diverticula in the descending and sigmoid portions of the colon. Lymphatic: No adenopathy. Reproductive: Prostate is unremarkable. Other: No abdominal wall hernia or abnormality. No abdominopelvic ascites. Musculoskeletal: No acute abnormality. Severe spinal stenosis at L2-3, L3-4, and L4-5. Diffuse degenerative disc and joint disease in the lumbar spine. Review of the MIP images confirms the above findings. IMPRESSION: 1. Dense consolidative infiltrate in the posterolateral aspect of the left lower lobe consistent pneumonia. 2. No evidence of aortic dissection. 3.  Extensive aortic Atherosclerosis (ICD10-I70.0). 4. Colonic diverticulosis. 5. Multilevel severe lumbar spinal stenosis. Electronically Signed   By: Lorriane Shire M.D.   On: 11/23/2018  10:39   [place] Ekg Site Rite  Result Date: 11/24/2018 If Site Rite image not attached, placement could not be confirmed due to current cardiac rhythm.   Scheduled Meds: . aspirin  81 mg Oral Daily  . atorvastatin  40 mg Oral QHS  . budesonide (PULMICORT) nebulizer solution  0.5 mg Nebulization BID  . cholecalciferol  2,000 Units Oral q1800  . enoxaparin (LOVENOX) injection  40 mg Subcutaneous Q24H  . gabapentin  300 mg Oral QHS  . guaiFENesin  600 mg Oral BID  . insulin aspart  0-15 Units Subcutaneous TID WC  . insulin aspart  0-5 Units Subcutaneous QHS  . ipratropium-albuterol  3 mL Nebulization Q6H  . loratadine  10 mg Oral Daily  . methylPREDNISolone (SOLU-MEDROL) injection  40 mg Intravenous Q12H  . multivitamin with minerals  1 tablet Oral Daily  . nystatin  5 mL Mouth/Throat QID  . pantoprazole  40 mg Oral Daily  . tiZANidine  4 mg Oral Nightly  . vitamin C  500 mg Oral  Daily   Continuous Infusions: . sodium chloride 5 mL/hr at 11/24/18 0600  . ceFEPime (MAXIPIME) IV    . phenylephrine (NEO-SYNEPHRINE) Adult infusion Stopped (11/24/18 0900)  . sodium chloride      Assessment/Plan:  1. Clinical sepsis with pneumonia.  Patient on cefepime.  Procalcitonin very elevated. 2. Acute on chronic hypoxic respiratory failure.  Patient currently on high flow nasal cannula.  Normally wears 4 L of oxygen. 3. Interstitial lung disease.  On nebulizers and steroids. 4. Large jump in white count with history of chronic myelocytic leukemia.  CBC with differential in the a.m. 5. Impaired fasting glucose.  On sliding scale. 6. Elevated troponin likely demand ischemia. 7. Elevated procalcitonin.  Code Status:     Code Status Orders  (From admission, onward)         Start     Ordered   11/23/18 1541  Do not attempt resuscitation (DNR)  Continuous    Question Answer Comment  In the event of cardiac or respiratory ARREST Do not call a "code blue"   In the event of cardiac or  respiratory ARREST Do not perform Intubation, CPR, defibrillation or ACLS   In the event of cardiac or respiratory ARREST Use medication by any route, position, wound care, and other measures to relive pain and suffering. May use oxygen, suction and manual treatment of airway obstruction as needed for comfort.      11/23/18 1540        Code Status History    Date Active Date Inactive Code Status Order ID Comments User Context   05/03/2018 1107 05/08/2018 1718 DNR [id number]  Nelva Bush, MD Inpatient   05/03/2018 0955 05/03/2018 1107 Full Code [id number]  Nelva Bush, MD Inpatient   05/02/2018 1609 05/03/2018 0955 DNR [id number]  Jimmy Footman, NP Inpatient   05/02/2018 1422 05/02/2018 1609 Full Code [id number]  Jimmy Footman, NP Inpatient   05/02/2018 1303 05/02/2018 1422 DNR [id number]  Jimmy Footman, NP Inpatient   05/01/2018 1254 05/02/2018 1303 Full Code [id number]  Nicholes Mango, MD Inpatient   03/24/2018 1948 03/28/2018 1340 Full Code [id number]  Saundra Shelling, MD Inpatient   03/03/2018 0328 03/06/2018 1716 Full Code [id number]  Harrie Foreman, MD Inpatient    Advance Directive Documentation     Most Recent Value  Type of Advance Directive  Healthcare Power of Attorney  Pre-existing out of facility DNR order (yellow form or pink MOST form)  -  "MOST" Form in Place?  -     Family Communication: As per critical care specialist Disposition Plan: To be determined  Consultants:  Critical care specialist  Antibiotics:  Cefepime  Time spent: 28 minutes.  Case discussed with critical care specialist and nursing staff.  Nurse to coordinate wife to bring in his teeth.  Gerarda Conklin [employer]

## 2018-11-24 NOTE — Progress Notes (Signed)
The pt was sitting in the chair at 12 and 4 so CPT was not given at those times.

## 2018-11-25 ENCOUNTER — Inpatient Hospital Stay: Payer: Medicare HMO

## 2018-11-25 DIAGNOSIS — J9601 Acute respiratory failure with hypoxia: Secondary | ICD-10-CM

## 2018-11-25 LAB — CBC WITH DIFFERENTIAL/PLATELET
Abs Immature Granulocytes: 1.3 10*3/uL — ABNORMAL HIGH (ref 0.00–0.07)
Basophils Absolute: 0.1 10*3/uL (ref 0.0–0.1)
Basophils Relative: 0 %
Eosinophils Absolute: 0 10*3/uL (ref 0.0–0.5)
Eosinophils Relative: 0 %
HCT: 27.3 % — ABNORMAL LOW (ref 39.0–52.0)
Hemoglobin: 9 g/dL — ABNORMAL LOW (ref 13.0–17.0)
Immature Granulocytes: 3 %
Lymphocytes Relative: 1 %
Lymphs Abs: 0.5 10*3/uL — ABNORMAL LOW (ref 0.7–4.0)
MCH: 33.7 pg (ref 26.0–34.0)
MCHC: 33 g/dL (ref 30.0–36.0)
MCV: 102.2 fL — ABNORMAL HIGH (ref 80.0–100.0)
Monocytes Absolute: 4.2 10*3/uL — ABNORMAL HIGH (ref 0.1–1.0)
Monocytes Relative: 11 %
Neutro Abs: 34.1 10*3/uL — ABNORMAL HIGH (ref 1.7–7.7)
Neutrophils Relative %: 85 %
Platelets: 139 10*3/uL — ABNORMAL LOW (ref 150–400)
RBC: 2.67 MIL/uL — ABNORMAL LOW (ref 4.22–5.81)
RDW: 15.5 % (ref 11.5–15.5)
Smear Review: NORMAL
WBC: 40.2 10*3/uL — ABNORMAL HIGH (ref 4.0–10.5)
nRBC: 0 % (ref 0.0–0.2)

## 2018-11-25 LAB — BASIC METABOLIC PANEL
Anion gap: 8 (ref 5–15)
BUN: 25 mg/dL — ABNORMAL HIGH (ref 8–23)
CO2: 28 mmol/L (ref 22–32)
Calcium: 8.5 mg/dL — ABNORMAL LOW (ref 8.9–10.3)
Chloride: 102 mmol/L (ref 98–111)
Creatinine, Ser: 0.73 mg/dL (ref 0.61–1.24)
GFR calc Af Amer: >60 mL/min (ref >60)
GFR calc non Af Amer: >60 mL/min (ref >60)
Glucose, Bld: 289 mg/dL — ABNORMAL HIGH (ref 70–99)
Potassium: 3.9 mmol/L (ref 3.5–5.1)
Sodium: 138 mmol/L (ref 135–145)

## 2018-11-25 LAB — GLUCOSE, CAPILLARY
Glucose-Capillary: 349 mg/dL — ABNORMAL HIGH (ref 70–99)
Glucose-Capillary: 279 mg/dL — ABNORMAL HIGH (ref 70–99)
Glucose-Capillary: 285 mg/dL — ABNORMAL HIGH (ref 70–99)
Glucose-Capillary: 290 mg/dL — ABNORMAL HIGH (ref 70–99)

## 2018-11-25 LAB — URINE CULTURE
Culture: NO GROWTH
Special Requests: NORMAL

## 2018-11-25 LAB — HEMOGLOBIN A1C
Hgb A1c MFr Bld: 8.6 % — ABNORMAL HIGH (ref 4.8–5.6)
Mean Plasma Glucose: 200.12 mg/dL

## 2018-11-25 LAB — PROCALCITONIN: Procalcitonin: 6.01 ng/mL

## 2018-11-25 MED ORDER — IPRATROPIUM-ALBUTEROL 0.5-2.5 (3) MG/3ML IN SOLN
3.0000 mL | Freq: Three times a day (TID) | RESPIRATORY_TRACT | Status: DC
  Administered 2018-11-25 – 2018-11-28 (×8): 3 mL via RESPIRATORY_TRACT
  Filled 2018-11-25 (×8): qty 3

## 2018-11-25 MED ORDER — SODIUM CHLORIDE 0.9 % IV SOLN
2.0000 g | Freq: Three times a day (TID) | INTRAVENOUS | Status: DC
  Administered 2018-11-25 – 2018-11-28 (×10): 2 g via INTRAVENOUS
  Filled 2018-11-25 (×13): qty 2

## 2018-11-25 MED ORDER — FLUTICASONE PROPIONATE 50 MCG/ACT NA SUSP
2.0000 | Freq: Every day | NASAL | Status: DC
  Administered 2018-11-25 – 2018-11-26 (×2): 2 via NASAL
  Filled 2018-11-25: qty 16

## 2018-11-25 MED ORDER — GUAIFENESIN-DM 100-10 MG/5ML PO SYRP
5.0000 mL | ORAL_SOLUTION | Freq: Four times a day (QID) | ORAL | Status: DC | PRN
  Administered 2018-11-25: 18:00:00 5 mL via ORAL
  Filled 2018-11-25: qty 5

## 2018-11-25 MED ORDER — INSULIN GLARGINE 100 UNIT/ML ~~LOC~~ SOLN
10.0000 [IU] | Freq: Every day | SUBCUTANEOUS | Status: DC
  Administered 2018-11-25 – 2018-11-28 (×4): 10 [IU] via SUBCUTANEOUS
  Filled 2018-11-25 (×5): qty 0.1

## 2018-11-25 NOTE — Evaluation (Signed)
Physical Therapy Evaluation Patient Details Name: Tony Watson MRN: [id number] DOB: 10-26-1950 Today's Date: 11/25/2018   History of Present Illness  68 y.o. male with a known history of coronary artery disease, arthritis, combined systolic diastolic heart failure, chronic respiratory failure on oxygen via nasal cannula at 2 L, chronic myelocytic leukemia, COPD, type 2 diabetes mellitus, diabetic neuropathy, GERD, hypertension, hyperlipidemia, interstitial lung disease presented to the emergency room for shortness of breath and mid back pain.     Clinical Impression  Patient agreeable to PT, alert, oriented, denies pain. O2 assessed with pt sitting EOB [place] at 5.5L in mid 80s. Improvement noted with PLB and time. Mild to mod desaturation with conversing with PT. Pt placed on 6L via [place] and was able to ambulate around the bed to the chair with handheld assist. Mild shakiness noted, overall steady. Mild desat to high 80s in chair, RN notified and in room to assess patient. Further mobility held at this time.  Overall the patient demonstrated deficits (see "PT Problem List") that impede the patient's functional abilities, safety, and mobility and would benefit from skilled PT intervention.  Recommendation is HHPT with supervision for mobility/OOB.     Follow Up Recommendations Home health PT;Supervision for mobility/OOB    Equipment Recommendations  None recommended by PT(Pt has WC, SPC, and RW)    Recommendations for Other Services       Precautions / Restrictions Precautions Precautions: Fall Precaution Comments: watch O2 Restrictions Weight Bearing Restrictions: No      Mobility  Bed Mobility               General bed mobility comments: sitting EOB at start of session  Transfers Overall transfer level: Needs assistance Equipment used: 1 person hand held assist Transfers: Sit to/from Stand Sit to Stand: Supervision            Ambulation/Gait Ambulation/Gait  assistance: Min guard Gait Distance (Feet): 12 Feet Assistive device: 1 person hand held assist Gait Pattern/deviations: WFL(Within Functional Limits) Gait velocity: decreased   General Gait Details: WFLs, mild shakiness noted but overall steady.  Stairs            Wheelchair Mobility    Modified Rankin (Stroke Patients Only)       Balance Overall balance assessment: Needs assistance Sitting-balance support: Feet supported Sitting balance-Leahy Scale: Good       Standing balance-Leahy Scale: Fair                               Pertinent Vitals/Pain Pain Assessment: No/denies pain    Home Living Family/patient expects to be discharged to:: Private residence Living Arrangements: Spouse/significant other Available Help at Discharge: Family;Available 24 hours/day Type of Home: House Home Access: Level entry;Elevator     Home Layout: One level(daughter lives upstairs, has elevator to access) Home Equipment: Toilet riser;Walker - 4 wheels;Cane - single point;Grab bars - tub/shower;Grab bars - toilet;Wheelchair - manual      Prior Function Level of Independence: Independent with assistive device(s)               Hand Dominance        Extremity/Trunk Assessment   Upper Extremity Assessment Upper Extremity Assessment: Generalized weakness    Lower Extremity Assessment Lower Extremity Assessment: Generalized weakness    Cervical / Trunk Assessment Cervical / Trunk Assessment: Normal  Communication   Communication: No difficulties  Cognition Arousal/Alertness: Awake/alert Behavior During Therapy: [facility]  for tasks assessed/performed Overall Cognitive Status: Within Functional Limits for tasks assessed                                        General Comments      Exercises Other Exercises Other Exercises: oxygen assessed in several positioning, variable O2 readings. Placed on 6L via [place] for mobility.    Assessment/Plan     PT Assessment Patient needs continued PT services  PT Problem List Decreased strength;Decreased mobility;Decreased range of motion;Decreased activity tolerance;Cardiopulmonary status limiting activity;Decreased balance;Decreased knowledge of use of DME       PT Treatment Interventions DME instruction;Functional mobility training;Balance training;Patient/family education;Gait training;Therapeutic activities;Neuromuscular re-education;Stair training;Therapeutic exercise    PT Goals (Current goals can be found in the Care Plan section)  Acute Rehab PT Goals Patient Stated Goal: patient wants to go home PT Goal Formulation: With patient Time For Goal Achievement: 12/09/18 Potential to Achieve Goals: Good    Frequency Min 2X/week   Barriers to discharge        Co-evaluation               AM-PAC PT "6 Clicks" Mobility  Outcome Measure Help needed turning from your back to your side while in a flat bed without using bedrails?: A Little Help needed moving from lying on your back to sitting on the side of a flat bed without using bedrails?: A Little Help needed moving to and from a bed to a chair (including a wheelchair)?: A Little Help needed standing up from a chair using your arms (e.g., wheelchair or bedside chair)?: A Little Help needed to walk in hospital room?: A Little Help needed climbing 3-5 steps with a railing? : A Little 6 Click Score: 18    End of Session Equipment Utilized During Treatment: Gait belt Activity Tolerance: Treatment limited secondary to medical complications (Comment)(oxygen) Patient left: in chair;with chair alarm set;with SCD's reapplied;with call bell/phone within reach Nurse Communication: Mobility status PT Visit Diagnosis: Unsteadiness on feet (R26.81);Muscle weakness (generalized) (M62.81);Other abnormalities of gait and mobility (R26.89)    Time: 1341-1419 PT Time Calculation (min) (ACUTE ONLY): 38 min   Charges:   PT Evaluation $PT  Eval Moderate Complexity: 1 Mod PT Treatments $Therapeutic Activity: 8-22 mins        Lieutenant Diego PT, DPT 3:27 PM,11/25/18 [phone]

## 2018-11-25 NOTE — Progress Notes (Signed)
   11/25/18 1400  Clinical Encounter Type  Visited With Patient  Visit Type Initial  Ch was rounding. Pt was sitting up and alert. He said he was doing better even though coughs still gave him a difficult time. Pt was keeping touch with his family members. Ch informed him of the chaplainsy service available to him.

## 2018-11-25 NOTE — Progress Notes (Signed)
Pharmacy Antibiotic Note  Tony Watson is a 68 y.o. male admitted on 11/23/2018 with sepsis and chest pain. Sepsis possibly secondary to pneumonia. PMH significant for leukemia, COPD, interstitial lung disease. Pharmacy has been consulted for cefepime dosing.  Plan: Day 3 on Cefepime from 2 g IV q8h. Will Continue.   Height: 5\' 11"  (180.3 cm) Weight: 194 lb 10.7 oz (88.3 kg) IBW/kg (Calculated) : 75.3  Temp (24hrs), Avg:98.1 F (36.7 C), Min:97.6 F (36.4 C), Max:98.6 F (37 C)  Recent Labs  Lab 11/23/18 0735  11/23/18 1452 11/23/18 2059 11/24/18 0003 11/24/18 0449 11/24/18 1120 11/25/18 0521  WBC 26.9*  --   --   --   --  67.1*  --  40.2*  CREATININE 0.89  --   --   --   --  1.57*  --  0.73  LATICACIDVEN  --    < > 3.8* 4.5* 3.5* 3.5* 5.0*  --    < > = values in this interval not displayed.    Estimated Creatinine Clearance: 96.7 mL/min (by C-G formula based on SCr of 0.73 mg/dL).    No Known Allergies  Antimicrobials this admission: Zosyn x1 4/15 Vancomycin 4/15 >> 4/16 Cefepime 4/15 >> Flagyl 4/16 >> 4/16  Dose adjustments this admission: 4/16 Decreased Cefepime from 2 g IV q8h to q12h  Microbiology results: 4/15 BCx: NGTD SARS-CoV 2 negative MRSA PCR : negative  Resp Cx: pending.   Thank you for allowing pharmacy to be a part of this patient's care.   Oswald Hillock, PharmD, BCPS  11/25/2018 1:05 PM

## 2018-11-25 NOTE — Progress Notes (Signed)
Patient ID: Tony Watson, male   DOB: 1950-11-13, 68 y.o.   MRN: [id number]  [facility] PROGRESS NOTE  Tony Watson [name]:[id number] DOB: 02-17-1951 DOA: 11/23/2018 PCP: Owens Loffler, MD  HPI/Subjective: Patient feeling a little bit better.  Still short of breath with moving around.  Still coughing a little bit.  He states that his nose is clogged up.  He had some diarrhea.  Objective: Vitals:   11/25/18 0700 11/25/18 0900  BP: (!) 101/56   Pulse: 100 88  Resp: (!) 21 (!) 23  Temp: 98.6 F (37 C)   SpO2: 95% 100%    Filed Weights   11/23/18 0753 11/23/18 1439  Weight: 88.5 kg 88.3 kg    ROS: Review of Systems  Constitutional: Negative for chills and fever.  Eyes: Negative for blurred vision.  Respiratory: Positive for cough and shortness of breath.   Cardiovascular: Negative for chest pain.  Gastrointestinal: Positive for diarrhea. Negative for abdominal pain, constipation, nausea and vomiting.  Genitourinary: Negative for dysuria.  Musculoskeletal: Negative for joint pain.  Neurological: Negative for dizziness and headaches.   Exam: Physical Exam  Constitutional: He is oriented to person, place, and time.  HENT:  Nose: No mucosal edema.  Mouth/Throat: No oropharyngeal exudate or posterior oropharyngeal edema.  Eyes: Pupils are equal, round, and reactive to light. Conjunctivae, EOM and lids are normal.  Neck: No JVD present. Carotid bruit is not present. No edema present. No thyroid mass and no thyromegaly present.  Cardiovascular: S1 normal and S2 normal. Exam reveals no gallop.  No murmur heard. Pulses:      Dorsalis pedis pulses are 2+ on the right side and 2+ on the left side.  Respiratory: No respiratory distress. He has decreased breath sounds in the right middle field, the right lower field, the left middle field and the left lower field. He has no wheezes. He has rhonchi in the right middle field, the right lower field, the left middle field and  the left lower field. He has no rales.  GI: Soft. Bowel sounds are normal. There is no abdominal tenderness.  Musculoskeletal:     Right ankle: He exhibits no swelling.     Left ankle: He exhibits no swelling.  Lymphadenopathy:    He has no cervical adenopathy.  Neurological: He is alert and oriented to person, place, and time. No cranial nerve deficit.  Skin: Skin is warm. Nails show no clubbing.  Bruising upper extremities  Psychiatric: He has a normal mood and affect.      Data Reviewed: Basic Metabolic Panel: Recent Labs  Lab 11/23/18 0735 11/24/18 0449 11/25/18 0521  NA 137 136 138  K 3.4* 4.0 3.9  CL 91* 95* 102  CO2 31 27 28   GLUCOSE 240* 164* 289*  BUN 17 30* 25*  CREATININE 0.89 1.57* 0.73  CALCIUM 9.1 7.8* 8.5*   Liver Function Tests: Recent Labs  Lab 11/23/18 0735  AST 29  ALT 39  ALKPHOS 47  BILITOT 1.0  PROT 8.1  ALBUMIN 4.0   CBC: Recent Labs  Lab 11/23/18 0735 11/24/18 0449 11/25/18 0521  WBC 26.9* 67.1* 40.2*  NEUTROABS  --   --  34.1*  HGB 12.2* 9.8* 9.0*  HCT 37.1* 30.5* 27.3*  MCV 103.3* 105.2* 102.2*  PLT 200 154 139*   Cardiac Enzymes: Recent Labs  Lab 11/23/18 0735 11/24/18 0449 11/24/18 1120  TROPONINI 0.03* 0.98* 0.78*   BNP (last 3 results) Recent Labs    05/01/18 1024  11/23/18 0735 11/24/18 0449  BNP 1,279.0* 101.0* 520.0*     CBG: Recent Labs  Lab 11/24/18 1115 11/24/18 1638 11/24/18 2147 11/25/18 0738 11/25/18 1155  GLUCAP 202* 242* 267* 349* 279*    Recent Results (from the past 240 hour(s))  SARS Coronavirus 2 [facility] order, Performed in [place] hospital lab)     Status: None   Collection Time: 11/23/18  8:52 AM  Result Value Ref Range Status   SARS Coronavirus 2 NEGATIVE NEGATIVE Final    Comment: (NOTE) If result is NEGATIVE SARS-CoV-2 target nucleic acids are NOT DETECTED. The SARS-CoV-2 RNA is generally detectable in upper and lower  respiratory specimens during the acute phase of  infection. The lowest  concentration of SARS-CoV-2 viral copies this assay can detect is 250  copies / mL. A negative result does not preclude SARS-CoV-2 infection  and should not be used as the sole basis for treatment or other  patient management decisions.  A negative result may occur with  improper specimen collection / handling, submission of specimen other  than nasopharyngeal swab, presence of viral mutation(s) within the  areas targeted by this assay, and inadequate number of viral copies  (<250 copies / mL). A negative result must be combined with clinical  observations, patient history, and epidemiological information. If result is POSITIVE SARS-CoV-2 target nucleic acids are DETECTED. The SARS-CoV-2 RNA is generally detectable in upper and lower  respiratory specimens dur ing the acute phase of infection.  Positive  results are indicative of active infection with SARS-CoV-2.  Clinical  correlation with patient history and other diagnostic information is  necessary to determine patient infection status.  Positive results do  not rule out bacterial infection or co-infection with other viruses. If result is PRESUMPTIVE POSTIVE SARS-CoV-2 nucleic acids MAY BE PRESENT.   A presumptive positive result was obtained on the submitted specimen  and confirmed on repeat testing.  While 2019 novel coronavirus  (SARS-CoV-2) nucleic acids may be present in the submitted sample  additional confirmatory testing may be necessary for epidemiological  and / or clinical management purposes  to differentiate between  SARS-CoV-2 and other Sarbecovirus currently known to infect humans.  If clinically indicated additional testing with an alternate test  methodology [phone]) is advised. The SARS-CoV-2 RNA is generally  detectable in upper and lower respiratory sp ecimens during the acute  phase of infection. The expected result is Negative. Fact Sheet for Patients:   StrictlyIdeas.no Fact Sheet for Healthcare Providers: BankingDealers.co.za This test is not yet approved or cleared by the [place] FDA and has been authorized for detection and/or diagnosis of SARS-CoV-2 by FDA under an Emergency Use Authorization (EUA).  This EUA will remain in effect (meaning this test can be used) for the duration of the COVID-19 declaration under Section 564(b)(1) of the Act, 21 U.S.C. section 360bbb-3(b)(1), unless the authorization is terminated or revoked sooner. Performed at [facility], [place] [address] [place], [place] [address]   Blood culture (routine x 2)     Status: None (Preliminary result)   Collection Time: 11/23/18 10:20 AM  Result Value Ref Range Status   Specimen Description BLOOD RIGHT ANTECUBITAL  Final   Special Requests   Final    BOTTLES DRAWN AEROBIC AND ANAEROBIC Blood Culture adequate volume   Culture   Final    NO GROWTH 2 DAYS Performed at [facility], [address], [place], [place] [address]    Report Status PENDING  Incomplete  Blood culture (routine x 2)     Status: None (Preliminary result)   Collection Time: 11/23/18 10:20 AM  Result Value Ref Range Status   Specimen Description BLOOD BLOOD LEFT FOREARM  Final   Special Requests   Final    BOTTLES DRAWN AEROBIC AND ANAEROBIC Blood Culture adequate volume   Culture   Final    NO GROWTH 2 DAYS Performed at [facility], [address], [place], [place] [address]    Report Status PENDING  Incomplete  Urine Culture     Status: None   Collection Time: 11/23/18 10:47 AM  Result Value Ref Range Status   Specimen Description   Final    URINE, RANDOM Performed at [facility], [address], [place], [place] [address]    Special Requests   Final    Normal Performed at [facility], [address], [place], [place] [address]    Culture   Final    NO GROWTH Performed at [facility], [place] [address] [place], [place] [address]    Report Status 11/25/2018 FINAL  Final  MRSA PCR Screening     Status: None   Collection Time: 11/23/18  2:41 PM  Result Value Ref Range Status   MRSA by PCR NEGATIVE NEGATIVE Final    Comment:        The GeneXpert MRSA Assay (FDA approved for NASAL specimens only), is one component of a comprehensive MRSA colonization surveillance program. It is not intended to diagnose MRSA infection nor to guide or monitor treatment for MRSA infections. Performed at [facility], [address] [place], [place] [address]   Expectorated sputum assessment w rflx to resp cult     Status: None   Collection Time: 11/24/18 11:39 AM  Result Value Ref Range Status   Specimen Description EXPECTORATED SPUTUM  Final   Special Requests Normal  Final   Sputum evaluation   Final    THIS SPECIMEN IS ACCEPTABLE FOR SPUTUM CULTURE Performed at [facility], [address] [place], [place] [address]    Report Status 11/24/2018 FINAL  Final  Culture, respiratory     Status: None (Preliminary result)   Collection Time: 11/24/18 11:39 AM  Result Value Ref Range Status   Specimen Description   Final    EXPECTORATED SPUTUM Performed at [facility], [address] [place], [place] [address]    Special Requests   Final    Normal Reflexed from [id number] Performed at [facility], [address] [place], [name] [address]    Gram Stain   Final    FEW WBC PRESENT,BOTH PMN AND MONONUCLEAR RARE GRAM POSITIVE COCCI IN PAIRS    Culture   Final    CULTURE REINCUBATED FOR BETTER GROWTH Performed at [facility], [place] [address] [place],  [address]    Report Status PENDING  Incomplete     Studies: Dg Chest Port 1 View  Result Date: 11/25/2018 CLINICAL DATA:  Acute respiratory failure EXAM: PORTABLE CHEST 1 VIEW COMPARISON:  11/24/2018 FINDINGS: Normal heart size. Heterogeneous but predominately  interstitial opacities throughout both lungs are stable. No pneumothorax or pleural effusion. IMPRESSION: Bilateral pulmonary opacities are stable. Electronically Signed   By: Marybelle Killings M.D.   On: 11/25/2018 09:23   Dg Chest Port 1 View  Result Date: 11/24/2018 CLINICAL DATA:  Pneumothorax. EXAM: PORTABLE CHEST 1 VIEW COMPARISON:  Radiograph of same day. FINDINGS: Stable cardiomediastinal silhouette. Sternotomy  wires are noted. No pneumothorax or pleural effusion is noted. Stable diffuse interstitial densities are noted throughout both lungs. Stable left lower lobe opacity is noted. Bony thorax is unremarkable. IMPRESSION: Stable diffuse interstitial densities are noted throughout both lungs most likely representing chronic interstitial lung disease or scarring. Stable asymmetric opacity is seen in left lung base which may represent acute inflammation. Electronically Signed   By: Marijo Conception M.D.   On: 11/24/2018 09:02   Dg Chest Port 1 View  Result Date: 11/24/2018 CLINICAL DATA:  Acute respiratory failure. EXAM: PORTABLE CHEST 1 VIEW COMPARISON:  CT chest 11/23/2018.  One-view chest x-ray 11/23/2018. FINDINGS: From the heart size is upper limits of normal. Persistent left lower lobe consolidation is present. Chronic interstitial coarsening is noted. There is no significant interval change. IMPRESSION: 1. Stable left lower lobe airspace disease, likely infection. 2. Chronic interstitial coarsening without other significant consolidated disease. Electronically Signed   By: San Morelle M.D.   On: 11/24/2018 06:42   [place] Ekg Site Rite  Result Date: 11/24/2018 If Site Rite image not attached, placement could not be confirmed due to current cardiac rhythm.   Scheduled Meds: . arformoterol  15 mcg Nebulization BID  . aspirin  81 mg Oral Daily  . atorvastatin  40 mg Oral QHS  . budesonide (PULMICORT) nebulizer solution  0.5 mg Nebulization BID  . cholecalciferol  2,000 Units Oral q1800   . enoxaparin (LOVENOX) injection  40 mg Subcutaneous Q24H  . gabapentin  300 mg Oral QHS  . guaiFENesin  600 mg Oral BID  . insulin aspart  0-15 Units Subcutaneous TID WC  . insulin aspart  0-5 Units Subcutaneous QHS  . insulin glargine  10 Units Subcutaneous Daily  . ipratropium-albuterol  3 mL Nebulization Q6H  . loratadine  10 mg Oral Daily  . methylPREDNISolone (SOLU-MEDROL) injection  40 mg Intravenous Q12H  . multivitamin with minerals  1 tablet Oral Daily  . nystatin  5 mL Mouth/Throat QID  . pantoprazole  40 mg Oral Daily  . tiZANidine  4 mg Oral Nightly  . vitamin C  500 mg Oral Daily   Continuous Infusions: . sodium chloride 5 mL/hr at 11/24/18 0600  . ceFEPime (MAXIPIME) IV 2 g (11/25/18 0954)    Assessment/Plan:  1. Clinical sepsis with pneumonia.  Patient on cefepime.  Procalcitonin very elevated. 2. Acute on chronic hypoxic respiratory failure.  Patient currently on high flow nasal cannula.  Normally wears 4 L of oxygen.  Patient will be transferred out of the ICU today 3. Interstitial lung disease.  On nebulizers and steroids. 4. Large jump in white count with history of chronic myelocytic leukemia.  White blood cell count trending down today. 5. Impaired fasting glucose.  On sliding scale and Lantus insulin while on steroids.  We will add on another hemoglobin A1c 6. Elevated troponin likely demand ischemia. 7. Elevated procalcitonin.  Code Status:     Code Status Orders  (From admission, onward)         Start     Ordered   11/23/18 1541  Do not attempt resuscitation (DNR)  Continuous    Question Answer Comment  In the event of cardiac or respiratory ARREST Do not call a "code blue"   In the event of cardiac or respiratory ARREST Do not perform Intubation, CPR, defibrillation or ACLS   In the event of cardiac or respiratory ARREST Use medication by any route, position, wound care, and other measures to relive  pain and suffering. May use oxygen, suction and  manual treatment of airway obstruction as needed for comfort.      11/23/18 1540        Code Status History    Date Active Date Inactive Code Status Order ID Comments User Context   05/03/2018 1107 05/08/2018 1718 DNR [id number]  Nelva Bush, MD Inpatient   05/03/2018 0955 05/03/2018 1107 Full Code [id number]  Nelva Bush, MD Inpatient   05/02/2018 1609 05/03/2018 0955 DNR [id number]  Jimmy Footman, NP Inpatient   05/02/2018 1422 05/02/2018 1609 Full Code [id number]  Jimmy Footman, NP Inpatient   05/02/2018 1303 05/02/2018 1422 DNR [id number]  Jimmy Footman, NP Inpatient   05/01/2018 1254 05/02/2018 1303 Full Code [id number]  Nicholes Mango, MD Inpatient   03/24/2018 1948 03/28/2018 1340 Full Code [id number]  Saundra Shelling, MD Inpatient   03/03/2018 0328 03/06/2018 1716 Full Code [id number]  Harrie Foreman, MD Inpatient    Advance Directive Documentation     Most Recent Value  Type of Advance Directive  Healthcare Power of Attorney  Pre-existing out of facility DNR order (yellow form or pink MOST form)  -  "MOST" Form in Place?  -     Family Communication: Spoke with daughter on the phone and gave an update Disposition Plan: To be determined  Consultants:  Critical care specialist  Antibiotics:  Cefepime  Time spent: 27 minutes.  Case discussed with critical care specialist.  [facility]

## 2018-11-25 NOTE — Progress Notes (Signed)
Name: Tony Watson MRN: [id number] DOB: 25-Nov-1950    ADMISSION DATE:  11/23/2018  BRIEF PATIENT DESCRIPTION 68 yo male admitted with acute on chronic hypoxic respiratory failure secondary to AECOPD and LLL pneumonia requiring HFNC  SIGNIFICANT EVENTS/STUDIES:  04/15-Pt admitted to the stepdown unit  04/15-CTA Chest revealed dense consolidative infiltrate in the posterolateral aspect of the left lower lobe consistent pneumonia. No evidence of aortic dissection. Extensive aortic Atherosclerosis (ICD10-I70.0). Colonic diverticulosis. Multilevel severe lumbar spinal stenosis   CULTURES: Blood x2 04/15>>negative  COVID-19 04/15>>negative  Urine 04/15>>negative  Sputum 04/16>>  PAST MEDICAL HISTORY :   has a past medical history of Arthritis, CAD (coronary artery disease), Chronic combined systolic (congestive) and diastolic (congestive) heart failure (HCC), Chronic respiratory failure with hypoxia ([place]), CML (chronic myelocytic leukemia) ([place]), COPD (chronic obstructive pulmonary disease) ([place]), Diabetes mellitus without complication ([place]), Diabetic autonomic neuropathy associated with type 2 diabetes mellitus ([place]) (03/27/2015), Diverticulosis (2002), GERD (gastroesophageal reflux disease), HTN (hypertension), Hyperlipidemia, ILD (interstitial lung disease) ([place]), Ischemic cardiomyopathy, Medical non-compliance, Smoker, Spinal stenosis, TIA (transient ischemic attack), and Ulcer.  has a past surgical history that includes open heart surgery (2004); Cardiac catheterization (11/99); Coronary angioplasty; Eye surgery; Cataract extraction w/PHACO (Right, 11/24/2016); Cataract extraction w/PHACO (Left, 12/29/2016); Back surgery; Flexible bronchoscopy (N/A, 05/19/2017); Bronchoscopy; Coronary artery bypass graft; Upper gi endoscopy; Colonoscopy; Colonoscopy with propofol (N/A, 05/25/2017); Esophagogastroduodenoscopy (egd) with propofol (N/A, 05/25/2017); and RIGHT/LEFT HEART CATH AND CORONARY  ANGIOGRAPHY (N/A, 05/03/2018).  SUBJECTIVE:  Pt states his shortness of breath have improved significantly currently on 6L HFNC   VITAL SIGNS: Temp:  [97.5 F (36.4 C)-98.6 F (37 C)] 98.6 F (37 C) (04/17 0700) Pulse Rate:  [88-118] 88 (04/17 0900) Resp:  [14-31] 23 (04/17 0900) BP: (91-136)/(42-117) 101/56 (04/17 0700) SpO2:  [70 %-100 %] 100 % (04/17 0900)  PHYSICAL EXAMINATION: General: well developed, well nourished male, NAD sitting in chair  Neuro: alert and oriented, follows commands  HEENT: supple, mild JVD  Cardiovascular: sinus tach, no R/G Lungs: faint rhonchi throughout, even, slightly labored  Abdomen: +BS x4, soft, non tender, non distended  Musculoskeletal: trace bilateral lower extremity edema Skin: scattered ecchymosis   Recent Labs  Lab 11/23/18 0735 11/24/18 0449 11/25/18 0521  NA 137 136 138  K 3.4* 4.0 3.9  CL 91* 95* 102  CO2 _0 BUN 17 30* 25*  CREATININE 0.89 1.57* 0.73  GLUCOSE 240* 164* 289*   Recent Labs  Lab 11/23/18 0735 11/24/18 0449 11/25/18 0521  HGB 12.2* 9.8* 9.0*  HCT 37.1* 30.5* 27.3*  WBC 26.9* 67.1* 40.2*  PLT 200 154 139*   Dg Chest Port 1 View  Result Date: 11/24/2018 CLINICAL DATA:  Pneumothorax. EXAM: PORTABLE CHEST 1 VIEW COMPARISON:  Radiograph of same day. FINDINGS: Stable cardiomediastinal silhouette. Sternotomy wires are noted. No pneumothorax or pleural effusion is noted. Stable diffuse interstitial densities are noted throughout both lungs. Stable left lower lobe opacity is noted. Bony thorax is unremarkable. IMPRESSION: Stable diffuse interstitial densities are noted throughout both lungs most likely representing chronic interstitial lung disease or scarring. Stable asymmetric opacity is seen in left lung base which may represent acute inflammation. Electronically Signed   By: Marijo Conception M.D.   On: 11/24/2018 09:02   Dg Chest Port 1 View  Result Date: 11/24/2018 CLINICAL DATA:  Acute respiratory  failure. EXAM: PORTABLE CHEST 1 VIEW COMPARISON:  CT chest 11/23/2018.  One-view chest x-ray 11/23/2018. FINDINGS: From the heart size is upper limits of  normal. Persistent left lower lobe consolidation is present. Chronic interstitial coarsening is noted. There is no significant interval change. IMPRESSION: 1. Stable left lower lobe airspace disease, likely infection. 2. Chronic interstitial coarsening without other significant consolidated disease. Electronically Signed   By: San Morelle M.D.   On: 11/24/2018 06:42   Ct Angio Chest/abd/pel For Dissection W And/or Wo Contrast  Result Date: 11/23/2018 CLINICAL DATA:  Acute chest and upper back pain and shortness of breath. EXAM: CT ANGIOGRAPHY CHEST, ABDOMEN AND PELVIS TECHNIQUE: Multidetector CT imaging through the chest, abdomen and pelvis was performed using the standard protocol during bolus administration of intravenous contrast. Multiplanar reconstructed images and MIPs were obtained and reviewed to evaluate the vascular anatomy. CONTRAST:  19m ISOVUE-370 IOPAMIDOL (ISOVUE-370) INJECTION 76% COMPARISON:  Chest x-ray dated 11/23/2018 and chest CT dated 05/13/2017 and CT scan of the abdomen and pelvis dated 02/24/2017 FINDINGS: CTA CHEST FINDINGS Cardiovascular: There is extensive coronary artery calcification. Minimal aortic atherosclerosis. Heart size is normal. No pericardial effusion. No evidence of thoracic aortic dissection. Mediastinum/Nodes: Chronic lobulation of the thyroid gland, unchanged. Trachea and esophagus are normal. No significant hilar or mediastinal adenopathy. Lungs/Pleura: There is a dense consolidative infiltrate in the posterolateral aspect of the left lower lobe. This is superimposed on chronic interstitial and obstructive lung disease. No effusions. Musculoskeletal: Previous median sternotomy. No acute bone abnormality. Review of the MIP images confirms the above findings. CTA ABDOMEN AND PELVIS FINDINGS VASCULAR Aorta:  Aortic atherosclerosis. Celiac: Widely patent. SMA: Widely patent. Renals: There are at least 4 widely patent right renal arteries. There is a single widely patent left renal artery. IMA: IMA is patent.  Dense calcification at the origin. Inflow: Patent without evidence of aneurysm, dissection, vasculitis or significant stenosis. Veins: No obvious venous abnormality within the limitations of this arterial phase study. Review of the MIP images confirms the above findings. NON-VASCULAR Hepatobiliary: No focal liver abnormality is seen. No gallstones, gallbladder wall thickening, or biliary dilatation. Pancreas: Unremarkable. No pancreatic ductal dilatation or surrounding inflammatory changes. Spleen: Normal in size without focal abnormality. Adrenals/Urinary Tract: Adrenal glands are unremarkable. Kidneys are normal, without renal calculi, focal lesion, or hydronephrosis. Bladder is unremarkable. Stomach/Bowel: Stomach is within normal limits. Appendix appears normal. No evidence of bowel wall thickening, distention, or inflammatory changes. Scattered diverticula in the descending and sigmoid portions of the colon. Lymphatic: No adenopathy. Reproductive: Prostate is unremarkable. Other: No abdominal wall hernia or abnormality. No abdominopelvic ascites. Musculoskeletal: No acute abnormality. Severe spinal stenosis at L2-3, L3-4, and L4-5. Diffuse degenerative disc and joint disease in the lumbar spine. Review of the MIP images confirms the above findings. IMPRESSION: 1. Dense consolidative infiltrate in the posterolateral aspect of the left lower lobe consistent pneumonia. 2. No evidence of aortic dissection. 3.  Extensive aortic Atherosclerosis (ICD10-I70.0). 4. Colonic diverticulosis. 5. Multilevel severe lumbar spinal stenosis. Electronically Signed   By: JLorriane ShireM.D.   On: 11/23/2018 10:39   [place]kg Site Rite  Result Date: 11/24/2018 If Site Rite image not attached, placement could not be confirmed due  to current cardiac rhythm.   ASSESSMENT / PLAN:  Acute on chronic hypoxic respiratory failure secondary to AECOPD and LLL pneumonia  Hx: Chronic home O2 _0  and Respiratory bronchiolitis  Supplemental O2 for dyspnea and/or hypoxia  Scheduled and prn bronchodilator therapy Nebulized steroids  Continue chest physiotherapy and mucinex  HTN  Supraventricular tachycardia-resolved Hypotension secondary to sepsis-resolved  Hx: Chronic combined systolic diastolic CHF, HLD, Ischemic cardiomyopathy  Continuous telemetry  monitoring  Continue outpatient antihypertensives  Continue aspirin and atorvastatin   Acute renal failure secondary to hypotension  Trend BMP Replace electrolytes as indicated  Monitor UOP  Avoid nephrotoxic medications   LLL pneumonia  Hx: CML Trend WBC and monitor fever curve  Trend PCT   Follow cultures  Continue cefepime    Anemia without obvious acute blood loss  VTE px: subq lovenox  Trend CBC  Monitor for s/sx of bleeding and transfuse for hgb <7  Diabetes Mellitus  SSI and lantus   -Pt stable to transfer to medsurg unit with off unit telemetry  Marda Stalker, [place] Pager [phone] (please enter 7 digits) PCCM Consult Pager [phone] (please enter 7 digits)

## 2018-11-25 NOTE — Progress Notes (Signed)
Pt sitting in chair tol well. 02 6l [place] now sats   Low 90s no distress.  From  ccu earlier.  Cont abx.

## 2018-11-25 NOTE — Progress Notes (Signed)
Report called to [facility] on 1C. Transferred to 101 via wheelchair with oxygen.

## 2018-11-26 ENCOUNTER — Inpatient Hospital Stay: Payer: Medicare HMO

## 2018-11-26 LAB — CBC WITH DIFFERENTIAL/PLATELET
Abs Immature Granulocytes: 1.03 10*3/uL — ABNORMAL HIGH (ref 0.00–0.07)
Basophils Absolute: 0 10*3/uL (ref 0.0–0.1)
Basophils Relative: 0 %
Eosinophils Absolute: 0 10*3/uL (ref 0.0–0.5)
Eosinophils Relative: 0 %
HCT: 26.7 % — ABNORMAL LOW (ref 39.0–52.0)
Hemoglobin: 8.7 g/dL — ABNORMAL LOW (ref 13.0–17.0)
Immature Granulocytes: 5 %
Lymphocytes Relative: 3 %
Lymphs Abs: 0.6 10*3/uL — ABNORMAL LOW (ref 0.7–4.0)
MCH: 33.5 pg (ref 26.0–34.0)
MCHC: 32.6 g/dL (ref 30.0–36.0)
MCV: 102.7 fL — ABNORMAL HIGH (ref 80.0–100.0)
Monocytes Absolute: 2.1 10*3/uL — ABNORMAL HIGH (ref 0.1–1.0)
Monocytes Relative: 9 %
Neutro Abs: 19 10*3/uL — ABNORMAL HIGH (ref 1.7–7.7)
Neutrophils Relative %: 83 %
Platelets: 141 10*3/uL — ABNORMAL LOW (ref 150–400)
RBC: 2.6 MIL/uL — ABNORMAL LOW (ref 4.22–5.81)
RDW: 15.2 % (ref 11.5–15.5)
WBC: 22.8 10*3/uL — ABNORMAL HIGH (ref 4.0–10.5)
nRBC: 0 % (ref 0.0–0.2)

## 2018-11-26 LAB — GLUCOSE, CAPILLARY
Glucose-Capillary: 274 mg/dL — ABNORMAL HIGH (ref 70–99)
Glucose-Capillary: 268 mg/dL — ABNORMAL HIGH (ref 70–99)
Glucose-Capillary: 332 mg/dL — ABNORMAL HIGH (ref 70–99)
Glucose-Capillary: 294 mg/dL — ABNORMAL HIGH (ref 70–99)

## 2018-11-26 LAB — BASIC METABOLIC PANEL
Anion gap: 11 (ref 5–15)
BUN: 27 mg/dL — ABNORMAL HIGH (ref 8–23)
CO2: 26 mmol/L (ref 22–32)
Calcium: 8.9 mg/dL (ref 8.9–10.3)
Chloride: 99 mmol/L (ref 98–111)
Creatinine, Ser: 0.76 mg/dL (ref 0.61–1.24)
GFR calc Af Amer: >60 mL/min (ref >60)
GFR calc non Af Amer: >60 mL/min (ref >60)
Glucose, Bld: 299 mg/dL — ABNORMAL HIGH (ref 70–99)
Potassium: 4 mmol/L (ref 3.5–5.1)
Sodium: 136 mmol/L (ref 135–145)

## 2018-11-26 LAB — LEGIONELLA PNEUMOPHILA SEROGP 1 UR AG: L. pneumophila Serogp 1 Ur Ag: NEGATIVE

## 2018-11-26 MED ORDER — GUAIFENESIN-CODEINE 100-10 MG/5ML PO SOLN
10.0000 mL | ORAL | Status: DC | PRN
  Administered 2018-11-26 – 2018-11-27 (×7): 10 mL via ORAL
  Filled 2018-11-26 (×7): qty 10

## 2018-11-26 MED ORDER — BENZONATATE 100 MG PO CAPS
200.0000 mg | ORAL_CAPSULE | Freq: Three times a day (TID) | ORAL | Status: DC
  Administered 2018-11-26 – 2018-11-28 (×7): 200 mg via ORAL
  Filled 2018-11-26 (×7): qty 2

## 2018-11-26 NOTE — Progress Notes (Signed)
Patient ID: Tony Watson, male   DOB: 1950-11-23, 68 y.o.   MRN: [id number]  [employer] PROGRESS NOTE  SIVAN QUAST QPY:[id number] DOB: 07-Mar-1951 DOA: 11/23/2018 PCP: Owens Loffler, MD  HPI/Subjective: Patient complains of dry cough and requesting some Tessalon Perles    Objective: Vitals:   11/26/18 0736 11/26/18 0744  BP:    Pulse:    Resp:    Temp:    SpO2: 99% 98%    Filed Weights   11/23/18 0753 11/23/18 1439  Weight: 88.5 kg 88.3 kg    ROS: Review of Systems  Constitutional: Negative for chills and fever.  Eyes: Negative for blurred vision.  Respiratory: Positive for cough and shortness of breath.   Cardiovascular: Negative for chest pain.  Gastrointestinal: Negative for abdominal pain, constipation, diarrhea, nausea and vomiting.  Genitourinary: Negative for dysuria.  Musculoskeletal: Negative for joint pain.  Neurological: Negative for dizziness and headaches.   Exam: Physical Exam  Constitutional: He is oriented to person, place, and time.  HENT:  Nose: No mucosal edema.  Mouth/Throat: No oropharyngeal exudate or posterior oropharyngeal edema.  Eyes: Pupils are equal, round, and reactive to light. Conjunctivae, EOM and lids are normal.  Neck: No JVD present. Carotid bruit is not present. No edema present. No thyroid mass and no thyromegaly present.  Cardiovascular: S1 normal and S2 normal. Exam reveals no gallop.  No murmur heard. Pulses:      Dorsalis pedis pulses are 2+ on the right side and 2+ on the left side.  Respiratory: No respiratory distress. He has decreased breath sounds. He has no wheezes. He has rhonchi. He has no rales.  GI: Soft. Bowel sounds are normal. There is no abdominal tenderness.  Musculoskeletal:     Right ankle: He exhibits no swelling.     Left ankle: He exhibits no swelling.  Lymphadenopathy:    He has no cervical adenopathy.  Neurological: He is alert and oriented to person, place, and time. No cranial nerve  deficit.  Skin: Skin is warm. Nails show no clubbing.  Bruising upper extremities  Psychiatric: He has a normal mood and affect.      Data Reviewed: Basic Metabolic Panel: Recent Labs  Lab 11/23/18 0735 11/24/18 0449 11/25/18 0521 11/26/18 0425  NA 137 136 138 136  K 3.4* 4.0 3.9 4.0  CL 91* 95* 102 99  CO2 31 27 28 26   GLUCOSE 240* 164* 289* 299*  BUN 17 30* 25* 27*  CREATININE 0.89 1.57* 0.73 0.76  CALCIUM 9.1 7.8* 8.5* 8.9   Liver Function Tests: Recent Labs  Lab 11/23/18 0735  AST 29  ALT 39  ALKPHOS 47  BILITOT 1.0  PROT 8.1  ALBUMIN 4.0   CBC: Recent Labs  Lab 11/23/18 0735 11/24/18 0449 11/25/18 0521 11/26/18 0425  WBC 26.9* 67.1* 40.2* 22.8*  NEUTROABS  --   --  34.1* 19.0*  HGB 12.2* 9.8* 9.0* 8.7*  HCT 37.1* 30.5* 27.3* 26.7*  MCV 103.3* 105.2* 102.2* 102.7*  PLT 200 154 139* 141*   Cardiac Enzymes: Recent Labs  Lab 11/23/18 0735 11/24/18 0449 11/24/18 1120  TROPONINI 0.03* 0.98* 0.78*   BNP (last 3 results) Recent Labs    05/01/18 1024 11/23/18 0735 11/24/18 0449  BNP 1,279.0* 101.0* 520.0*    ProBNP (last 3 results) No results for input(s): PROBNP in the last 8760 hours.  CBG: Recent Labs  Lab 11/25/18 0738 11/25/18 1155 11/25/18 1806 11/25/18 2115 11/26/18 0805  GLUCAP 349* 279* 285* 290*  274*    Recent Results (from the past 240 hour(s))  SARS Coronavirus 2 [facility] order, Performed in [place] hospital lab)     Status: None   Collection Time: 11/23/18  8:52 AM  Result Value Ref Range Status   SARS Coronavirus 2 NEGATIVE NEGATIVE Final    Comment: (NOTE) If result is NEGATIVE SARS-CoV-2 target nucleic acids are NOT DETECTED. The SARS-CoV-2 RNA is generally detectable in upper and lower  respiratory specimens during the acute phase of infection. The lowest  concentration of SARS-CoV-2 viral copies this assay can detect is 250  copies / mL. A negative result does not preclude SARS-CoV-2 infection  and should  not be used as the sole basis for treatment or other  patient management decisions.  A negative result may occur with  improper specimen collection / handling, submission of specimen other  than nasopharyngeal swab, presence of viral mutation(s) within the  areas targeted by this assay, and inadequate number of viral copies  (<250 copies / mL). A negative result must be combined with clinical  observations, patient history, and epidemiological information. If result is POSITIVE SARS-CoV-2 target nucleic acids are DETECTED. The SARS-CoV-2 RNA is generally detectable in upper and lower  respiratory specimens dur ing the acute phase of infection.  Positive  results are indicative of active infection with SARS-CoV-2.  Clinical  correlation with patient history and other diagnostic information is  necessary to determine patient infection status.  Positive results do  not rule out bacterial infection or co-infection with other viruses. If result is PRESUMPTIVE POSTIVE SARS-CoV-2 nucleic acids MAY BE PRESENT.   A presumptive positive result was obtained on the submitted specimen  and confirmed on repeat testing.  While 2019 novel coronavirus  (SARS-CoV-2) nucleic acids may be present in the submitted sample  additional confirmatory testing may be necessary for epidemiological  and / or clinical management purposes  to differentiate between  SARS-CoV-2 and other Sarbecovirus currently known to infect humans.  If clinically indicated additional testing with an alternate test  methodology [phone]) is advised. The SARS-CoV-2 RNA is generally  detectable in upper and lower respiratory sp ecimens during the acute  phase of infection. The expected result is Negative. Fact Sheet for Patients:  StrictlyIdeas.no Fact Sheet for Healthcare Providers: BankingDealers.co.za This test is not yet approved or cleared by the [place] FDA and has been  authorized for detection and/or diagnosis of SARS-CoV-2 by FDA under an Emergency Use Authorization (EUA).  This EUA will remain in effect (meaning this test can be used) for the duration of the COVID-19 declaration under Section 564(b)(1) of the Act, 21 U.S.C. section 360bbb-3(b)(1), unless the authorization is terminated or revoked sooner. Performed at [facility], [place] [address] [place], [place] [address]   Blood culture (routine x 2)     Status: None (Preliminary result)   Collection Time: 11/23/18 10:20 AM  Result Value Ref Range Status   Specimen Description BLOOD RIGHT ANTECUBITAL  Final   Special Requests   Final    BOTTLES DRAWN AEROBIC AND ANAEROBIC Blood Culture adequate volume   Culture   Final    NO GROWTH 3 DAYS Performed at [facility], [address] [place], [place] [address]    Report Status PENDING  Incomplete  Blood culture (routine x 2)     Status: None (Preliminary result)   Collection Time: 11/23/18 10:20 AM  Result Value Ref Range Status   Specimen Description BLOOD BLOOD LEFT FOREARM  Final   Special Requests   Final    BOTTLES DRAWN AEROBIC AND ANAEROBIC Blood Culture adequate volume   Culture   Final    NO GROWTH 3 DAYS Performed at [facility], [address] [place], [place] [address]    Report Status PENDING  Incomplete  Urine Culture     Status: None   Collection Time: 11/23/18 10:47 AM  Result Value Ref Range Status   Specimen Description   Final    URINE, RANDOM Performed at [facility], [address] [place], [place] [address]    Special Requests   Final    Normal Performed at [facility], [address], [place], [place] [address]    Culture   Final    NO GROWTH Performed at [facility], [place] [address], [place], [place] [address]    Report Status 11/25/2018 FINAL  Final  MRSA PCR Screening     Status: None   Collection Time: 11/23/18  2:41 PM  Result Value Ref Range  Status   MRSA by PCR NEGATIVE NEGATIVE Final    Comment:        The GeneXpert MRSA Assay (FDA approved for NASAL specimens only), is one component of a comprehensive MRSA colonization surveillance program. It is not intended to diagnose MRSA infection nor to guide or monitor treatment for MRSA infections. Performed at [facility], [address] [place], [place] [address]   Expectorated sputum assessment w rflx to resp cult     Status: None   Collection Time: 11/24/18 11:39 AM  Result Value Ref Range Status   Specimen Description EXPECTORATED SPUTUM  Final   Special Requests Normal  Final   Sputum evaluation   Final    THIS SPECIMEN IS ACCEPTABLE FOR SPUTUM CULTURE Performed at [facility], [address], [place], [place] [address]    Report Status 11/24/2018 FINAL  Final  Culture, respiratory     Status: None (Preliminary result)   Collection Time: 11/24/18 11:39 AM  Result Value Ref Range Status   Specimen Description   Final    EXPECTORATED SPUTUM Performed at [facility], [address] [place], [place] [address]    Special Requests   Final    Normal Reflexed from [id number] Performed at [facility], [address] [place], [place] [address]    Gram Stain   Final    FEW WBC PRESENT,BOTH PMN AND MONONUCLEAR RARE GRAM POSITIVE COCCI IN PAIRS    Culture   Final    CULTURE REINCUBATED FOR BETTER GROWTH Performed at [facility], [place] [address] [place],  [address]    Report Status PENDING  Incomplete     Studies: Dg Chest Port 1 View  Result Date: 11/26/2018 CLINICAL DATA:  Acute respiratory failure. EXAM: PORTABLE CHEST 1 VIEW COMPARISON:  November 25, 2018 FINDINGS: Chronic interstitial lung disease. Mildly more focal opacity in the left retrocardiac region is similar. No other interval changes. No pneumothorax. IMPRESSION: 1. Chronic interstitial lung disease. 2. Mildly more prominent left retrocardiac opacity  may remain, better seen on the CT scan from November 23, 2018. No other change. Electronically Signed   By: Dorise Bullion III M.D   On: 11/26/2018 08:09   Dg Chest Port 1 View  Result Date: 11/25/2018 CLINICAL DATA:  Acute respiratory failure EXAM: PORTABLE CHEST 1 VIEW COMPARISON:  11/24/2018 FINDINGS: Normal heart size. Heterogeneous but predominately interstitial opacities throughout both lungs are  stable. No pneumothorax or pleural effusion. IMPRESSION: Bilateral pulmonary opacities are stable. Electronically Signed   By: Marybelle Killings M.D.   On: 11/25/2018 09:23    Scheduled Meds: . arformoterol  15 mcg Nebulization BID  . aspirin  81 mg Oral Daily  . atorvastatin  40 mg Oral QHS  . benzonatate  200 mg Oral TID  . budesonide (PULMICORT) nebulizer solution  0.5 mg Nebulization BID  . cholecalciferol  2,000 Units Oral q1800  . enoxaparin (LOVENOX) injection  40 mg Subcutaneous Q24H  . fluticasone  2 spray Each Nare QHS  . gabapentin  300 mg Oral QHS  . guaiFENesin  600 mg Oral BID  . insulin aspart  0-15 Units Subcutaneous TID WC  . insulin aspart  0-5 Units Subcutaneous QHS  . insulin glargine  10 Units Subcutaneous Daily  . ipratropium-albuterol  3 mL Nebulization TID  . loratadine  10 mg Oral Daily  . methylPREDNISolone (SOLU-MEDROL) injection  40 mg Intravenous Q12H  . multivitamin with minerals  1 tablet Oral Daily  . nystatin  5 mL Mouth/Throat QID  . pantoprazole  40 mg Oral Daily  . tiZANidine  4 mg Oral Nightly  . vitamin C  500 mg Oral Daily   Continuous Infusions: . sodium chloride 5 mL/hr at 11/24/18 0600  . ceFEPime (MAXIPIME) IV 2 g (11/26/18 4196)    Assessment/Plan:  1. Clinical sepsis with pneumonia.  Continue cefepime.  Add Tessalon Perles and codeine cough 2. Acute on chronic hypoxic respiratory failure.  Patient currently on high flow nasal cannula.  Normally wears 4 L of oxygen.  Continue wean oxygen 3. Interstitial lung disease.  On nebulizers and  steroids. 4. Leukocytosis follow WBC count trending down now 5. Impaired fasting glucose.  On sliding scale. 6. Elevated troponin likely demand ischemia. 7. Elevated procalcitonin.  Code Status:     Code Status Orders  (From admission, onward)         Start     Ordered   11/23/18 1541  Do not attempt resuscitation (DNR)  Continuous    Question Answer Comment  In the event of cardiac or respiratory ARREST Do not call a "code blue"   In the event of cardiac or respiratory ARREST Do not perform Intubation, CPR, defibrillation or ACLS   In the event of cardiac or respiratory ARREST Use medication by any route, position, wound care, and other measures to relive pain and suffering. May use oxygen, suction and manual treatment of airway obstruction as needed for comfort.      11/23/18 1540        Code Status History    Date Active Date Inactive Code Status Order ID Comments User Context   05/03/2018 1107 05/08/2018 1718 DNR [id number]  Nelva Bush, MD Inpatient   05/03/2018 0955 05/03/2018 1107 Full Code [id number]  Nelva Bush, MD Inpatient   05/02/2018 1609 05/03/2018 0955 DNR [id number]  Jimmy Footman, NP Inpatient   05/02/2018 1422 05/02/2018 1609 Full Code [id number]  Jimmy Footman, NP Inpatient   05/02/2018 1303 05/02/2018 1422 DNR [id number]  Jimmy Footman, NP Inpatient   05/01/2018 1254 05/02/2018 1303 Full Code [id number]  Nicholes Mango, MD Inpatient   03/24/2018 1948 03/28/2018 1340 Full Code [id number]  Saundra Shelling, MD Inpatient   03/03/2018 0328 03/06/2018 1716 Full Code [id number]  Harrie Foreman, MD Inpatient    Advance Directive Documentation     Most Recent Value  Type of Advance Directive  Healthcare Power of  Attorney  Pre-existing out of facility DNR order (yellow form or pink MOST form)  -  "MOST" Form in Place?  -     Family Communication: As per critical care specialist Disposition Plan: To be  determined  Consultants:  Critical care specialist  Antibiotics:  Cefepime  Time spent: 28 minutes.  Case discussed with critical care specialist and nursing staff.  Nurse to coordinate wife to bring in his teeth.  Rafiel Mecca [employer] [employer] [facility]

## 2018-11-27 ENCOUNTER — Inpatient Hospital Stay: Payer: Medicare HMO

## 2018-11-27 LAB — CBC
HCT: 30.2 % — ABNORMAL LOW (ref 39.0–52.0)
Hemoglobin: 9.9 g/dL — ABNORMAL LOW (ref 13.0–17.0)
MCH: 33.2 pg (ref 26.0–34.0)
MCHC: 32.8 g/dL (ref 30.0–36.0)
MCV: 101.3 fL — ABNORMAL HIGH (ref 80.0–100.0)
Platelets: 161 10*3/uL (ref 150–400)
RBC: 2.98 MIL/uL — ABNORMAL LOW (ref 4.22–5.81)
RDW: 15.1 % (ref 11.5–15.5)
WBC: 19.6 10*3/uL — ABNORMAL HIGH (ref 4.0–10.5)
nRBC: 0.2 % (ref 0.0–0.2)

## 2018-11-27 LAB — GLUCOSE, CAPILLARY
Glucose-Capillary: 266 mg/dL — ABNORMAL HIGH (ref 70–99)
Glucose-Capillary: 291 mg/dL — ABNORMAL HIGH (ref 70–99)
Glucose-Capillary: 257 mg/dL — ABNORMAL HIGH (ref 70–99)
Glucose-Capillary: 259 mg/dL — ABNORMAL HIGH (ref 70–99)

## 2018-11-27 LAB — BASIC METABOLIC PANEL
Anion gap: 11 (ref 5–15)
BUN: 24 mg/dL — ABNORMAL HIGH (ref 8–23)
CO2: 26 mmol/L (ref 22–32)
Calcium: 9 mg/dL (ref 8.9–10.3)
Chloride: 99 mmol/L (ref 98–111)
Creatinine, Ser: 0.89 mg/dL (ref 0.61–1.24)
GFR calc Af Amer: >60 mL/min (ref >60)
GFR calc non Af Amer: >60 mL/min (ref >60)
Glucose, Bld: 289 mg/dL — ABNORMAL HIGH (ref 70–99)
Potassium: 4 mmol/L (ref 3.5–5.1)
Sodium: 136 mmol/L (ref 135–145)

## 2018-11-27 NOTE — Progress Notes (Signed)
Patient ID: NIKHOLAS GEFFRE, male   DOB: 1950-12-14, 68 y.o.   MRN: [id number]  [facility] PROGRESS NOTE  CAITLIN HILLMER IOX:[id number] DOB: 18-May-1951 DOA: 11/23/2018 PCP: Owens Loffler, MD  HPI/Subjective: Patient continues to have significant cough.  Any chest pain  Objective: Vitals:   11/27/18 0731 11/27/18 0733  BP:  140/89  Pulse:  92  Resp:  16  Temp:  (!) 97.4 F (36.3 C)  SpO2: 98% 98%    Filed Weights   11/23/18 0753 11/23/18 1439  Weight: 88.5 kg 88.3 kg    ROS: Review of Systems  Constitutional: Negative for chills and fever.  Eyes: Negative for blurred vision.  Respiratory: Positive for cough and shortness of breath.   Cardiovascular: Negative for chest pain.  Gastrointestinal: Negative for abdominal pain, constipation, diarrhea, nausea and vomiting.  Genitourinary: Negative for dysuria.  Musculoskeletal: Negative for joint pain.  Neurological: Negative for dizziness and headaches.   Exam: Physical Exam  Constitutional: He is oriented to person, place, and time.  HENT:  Nose: No mucosal edema.  Mouth/Throat: No oropharyngeal exudate or posterior oropharyngeal edema.  Eyes: Pupils are equal, round, and reactive to light. Conjunctivae, EOM and lids are normal.  Neck: No JVD present. Carotid bruit is not present. No edema present. No thyroid mass and no thyromegaly present.  Cardiovascular: S1 normal and S2 normal. Exam reveals no gallop.  No murmur heard. Pulses:      Dorsalis pedis pulses are 2+ on the right side and 2+ on the left side.  Respiratory: No respiratory distress. He has decreased breath sounds. He has no wheezes. He has rhonchi. He has no rales.  GI: Soft. Bowel sounds are normal. There is no abdominal tenderness.  Musculoskeletal:     Right ankle: He exhibits no swelling.     Left ankle: He exhibits no swelling.  Lymphadenopathy:    He has no cervical adenopathy.  Neurological: He is alert and oriented to person, place, and  time. No cranial nerve deficit.  Skin: Skin is warm. Nails show no clubbing.  Bruising upper extremities  Psychiatric: He has a normal mood and affect.      Data Reviewed: Basic Metabolic Panel: Recent Labs  Lab 11/23/18 0735 11/24/18 0449 11/25/18 0521 11/26/18 0425 11/27/18 0455  NA 137 136 138 136 136  K 3.4* 4.0 3.9 4.0 4.0  CL 91* 95* 102 99 99  CO2 31 27 28 26 26   GLUCOSE 240* 164* 289* 299* 289*  BUN 17 30* 25* 27* 24*  CREATININE 0.89 1.57* 0.73 0.76 0.89  CALCIUM 9.1 7.8* 8.5* 8.9 9.0   Liver Function Tests: Recent Labs  Lab 11/23/18 0735  AST 29  ALT 39  ALKPHOS 47  BILITOT 1.0  PROT 8.1  ALBUMIN 4.0   CBC: Recent Labs  Lab 11/23/18 0735 11/24/18 0449 11/25/18 0521 11/26/18 0425 11/27/18 0455  WBC 26.9* 67.1* 40.2* 22.8* 19.6*  NEUTROABS  --   --  34.1* 19.0*  --   HGB 12.2* 9.8* 9.0* 8.7* 9.9*  HCT 37.1* 30.5* 27.3* 26.7* 30.2*  MCV 103.3* 105.2* 102.2* 102.7* 101.3*  PLT 200 154 139* 141* 161   Cardiac Enzymes: Recent Labs  Lab 11/23/18 0735 11/24/18 0449 11/24/18 1120  TROPONINI 0.03* 0.98* 0.78*   BNP (last 3 results) Recent Labs    05/01/18 1024 11/23/18 0735 11/24/18 0449  BNP 1,279.0* 101.0* 520.0*    ProBNP (last 3 results) No results for input(s): PROBNP in the last 8760 hours.  CBG: Recent Labs  Lab 11/26/18 0805 11/26/18 1150 11/26/18 1640 11/26/18 2107 11/27/18 0730  GLUCAP 274* 268* 332* 294* 266*    Recent Results (from the past 240 hour(s))  SARS Coronavirus 2 [facility] order, Performed in [place] hospital lab)     Status: None   Collection Time: 11/23/18  8:52 AM  Result Value Ref Range Status   SARS Coronavirus 2 NEGATIVE NEGATIVE Final    Comment: (NOTE) If result is NEGATIVE SARS-CoV-2 target nucleic acids are NOT DETECTED. The SARS-CoV-2 RNA is generally detectable in upper and lower  respiratory specimens during the acute phase of infection. The lowest  concentration of SARS-CoV-2 viral  copies this assay can detect is 250  copies / mL. A negative result does not preclude SARS-CoV-2 infection  and should not be used as the sole basis for treatment or other  patient management decisions.  A negative result may occur with  improper specimen collection / handling, submission of specimen other  than nasopharyngeal swab, presence of viral mutation(s) within the  areas targeted by this assay, and inadequate number of viral copies  (<250 copies / mL). A negative result must be combined with clinical  observations, patient history, and epidemiological information. If result is POSITIVE SARS-CoV-2 target nucleic acids are DETECTED. The SARS-CoV-2 RNA is generally detectable in upper and lower  respiratory specimens dur ing the acute phase of infection.  Positive  results are indicative of active infection with SARS-CoV-2.  Clinical  correlation with patient history and other diagnostic information is  necessary to determine patient infection status.  Positive results do  not rule out bacterial infection or co-infection with other viruses. If result is PRESUMPTIVE POSTIVE SARS-CoV-2 nucleic acids MAY BE PRESENT.   A presumptive positive result was obtained on the submitted specimen  and confirmed on repeat testing.  While 2019 novel coronavirus  (SARS-CoV-2) nucleic acids may be present in the submitted sample  additional confirmatory testing may be necessary for epidemiological  and / or clinical management purposes  to differentiate between  SARS-CoV-2 and other Sarbecovirus currently known to infect humans.  If clinically indicated additional testing with an alternate test  methodology [phone]) is advised. The SARS-CoV-2 RNA is generally  detectable in upper and lower respiratory sp ecimens during the acute  phase of infection. The expected result is Negative. Fact Sheet for Patients:  StrictlyIdeas.no Fact Sheet for Healthcare  Providers: BankingDealers.co.za This test is not yet approved or cleared by the [place] FDA and has been authorized for detection and/or diagnosis of SARS-CoV-2 by FDA under an Emergency Use Authorization (EUA).  This EUA will remain in effect (meaning this test can be used) for the duration of the COVID-19 declaration under Section 564(b)(1) of the Act, 21 U.S.C. section 360bbb-3(b)(1), unless the authorization is terminated or revoked sooner. Performed at [facility], [place] [address], [place], [place] [address]   Blood culture (routine x 2)     Status: None (Preliminary result)   Collection Time: 11/23/18 10:20 AM  Result Value Ref Range Status   Specimen Description BLOOD RIGHT ANTECUBITAL  Final   Special Requests   Final    BOTTLES DRAWN AEROBIC AND ANAEROBIC Blood Culture adequate volume   Culture   Final    NO GROWTH 4 DAYS Performed at [facility], [address] [place], [place] [address]    Report Status PENDING  Incomplete  Blood culture (routine x 2)     Status: None (Preliminary result)  Collection Time: 11/23/18 10:20 AM  Result Value Ref Range Status   Specimen Description BLOOD BLOOD LEFT FOREARM  Final   Special Requests   Final    BOTTLES DRAWN AEROBIC AND ANAEROBIC Blood Culture adequate volume   Culture   Final    NO GROWTH 4 DAYS Performed at [facility], [address] [place], [place] [address]    Report Status PENDING  Incomplete  Urine Culture     Status: None   Collection Time: 11/23/18 10:47 AM  Result Value Ref Range Status   Specimen Description   Final    URINE, RANDOM Performed at [facility], [address], [place], [place] [address]    Special Requests   Final    Normal Performed at [facility], [address], [place], [place] [address]    Culture   Final    NO GROWTH Performed at [facility], [place] [address], [place], [place] [address]    Report  Status 11/25/2018 FINAL  Final  MRSA PCR Screening     Status: None   Collection Time: 11/23/18  2:41 PM  Result Value Ref Range Status   MRSA by PCR NEGATIVE NEGATIVE Final    Comment:        The GeneXpert MRSA Assay (FDA approved for NASAL specimens only), is one component of a comprehensive MRSA colonization surveillance program. It is not intended to diagnose MRSA infection nor to guide or monitor treatment for MRSA infections. Performed at [facility], [address] [place], [place] [address]   Expectorated sputum assessment w rflx to resp cult     Status: None   Collection Time: 11/24/18 11:39 AM  Result Value Ref Range Status   Specimen Description EXPECTORATED SPUTUM  Final   Special Requests Normal  Final   Sputum evaluation   Final    THIS SPECIMEN IS ACCEPTABLE FOR SPUTUM CULTURE Performed at [facility], [address], [place], [place] [address]    Report Status 11/24/2018 FINAL  Final  Culture, respiratory     Status: None (Preliminary result)   Collection Time: 11/24/18 11:39 AM  Result Value Ref Range Status   Specimen Description   Final    EXPECTORATED SPUTUM Performed at [facility], [address], [place], [place] [address]    Special Requests   Final    Normal Reflexed from [phone] Performed at [facility], [address] [place], [place] [address]    Gram Stain   Final    FEW WBC PRESENT,BOTH PMN AND MONONUCLEAR RARE GRAM POSITIVE COCCI IN PAIRS    Culture   Final    FEW SERRATIA MARCESCENS SUSCEPTIBILITIES TO FOLLOW Performed at [facility], [place] [address], [place], [place] [address]    Report Status PENDING  Incomplete     Studies: Dg Chest Port 1 View  Result Date: 11/26/2018 CLINICAL DATA:  Acute respiratory failure. EXAM: PORTABLE CHEST 1 VIEW COMPARISON:  November 25, 2018 FINDINGS: Chronic interstitial lung disease. Mildly more focal opacity in the left retrocardiac region is similar.  No other interval changes. No pneumothorax. IMPRESSION: 1. Chronic interstitial lung disease. 2. Mildly more prominent left retrocardiac opacity may remain, better seen on the CT scan from November 23, 2018. No other change. Electronically Signed   By: Dorise Bullion III M.D   On: 11/26/2018 08:09    Scheduled Meds: . arformoterol  15 mcg Nebulization BID  . aspirin  81  mg Oral Daily  . atorvastatin  40 mg Oral QHS  . benzonatate  200 mg Oral TID  . budesonide (PULMICORT) nebulizer solution  0.5 mg Nebulization BID  . cholecalciferol  2,000 Units Oral q1800  . enoxaparin (LOVENOX) injection  40 mg Subcutaneous Q24H  . fluticasone  2 spray Each Nare QHS  . gabapentin  300 mg Oral QHS  . guaiFENesin  600 mg Oral BID  . insulin aspart  0-15 Units Subcutaneous TID WC  . insulin aspart  0-5 Units Subcutaneous QHS  . insulin glargine  10 Units Subcutaneous Daily  . ipratropium-albuterol  3 mL Nebulization TID  . loratadine  10 mg Oral Daily  . methylPREDNISolone (SOLU-MEDROL) injection  40 mg Intravenous Q12H  . multivitamin with minerals  1 tablet Oral Daily  . nystatin  5 mL Mouth/Throat QID  . pantoprazole  40 mg Oral Daily  . tiZANidine  4 mg Oral Nightly  . vitamin C  500 mg Oral Daily   Continuous Infusions: . sodium chloride Stopped (11/27/18 0933)  . ceFEPime (MAXIPIME) IV Stopped (11/27/18 0910)    Assessment/Plan:  1. Clinical sepsis with pneumonia.  Continue cefepime.  Continue Tessalon Perles and codeine cough syrup will adjust the dose. 2. Acute on chronic hypoxic respiratory failure.  Patient currently on high flow nasal cannula.  Normally wears 4 L of oxygen.  Continue wean oxygen, will repeat chest x-ray make sure pneumonia is improving 3. Interstitial lung disease.  Continue nebulizers and steroids. 4. Leukocytosis follow WBC count trending down now 5. Impaired fasting glucose.  On sliding scale. 6. Elevated troponin likely demand ischemia. 7. Elevated  procalcitonin.  Code Status:     Code Status Orders  (From admission, onward)         Start     Ordered   11/23/18 1541  Do not attempt resuscitation (DNR)  Continuous    Question Answer Comment  In the event of cardiac or respiratory ARREST Do not call a "code blue"   In the event of cardiac or respiratory ARREST Do not perform Intubation, CPR, defibrillation or ACLS   In the event of cardiac or respiratory ARREST Use medication by any route, position, wound care, and other measures to relive pain and suffering. May use oxygen, suction and manual treatment of airway obstruction as needed for comfort.      11/23/18 1540        Code Status History    Date Active Date Inactive Code Status Order ID Comments User Context   05/03/2018 1107 05/08/2018 1718 DNR [id number]  Nelva Bush, MD Inpatient   05/03/2018 0955 05/03/2018 1107 Full Code [id number]  Nelva Bush, MD Inpatient   05/02/2018 1609 05/03/2018 0955 DNR [id number]  Jimmy Footman, NP Inpatient   05/02/2018 1422 05/02/2018 1609 Full Code [id number]  Jimmy Footman, NP Inpatient   05/02/2018 1303 05/02/2018 1422 DNR [id number]  Jimmy Footman, NP Inpatient   05/01/2018 1254 05/02/2018 1303 Full Code [id number]  Nicholes Mango, MD Inpatient   03/24/2018 1948 03/28/2018 1340 Full Code [id number]  Saundra Shelling, MD Inpatient   03/03/2018 0328 03/06/2018 1716 Full Code [id number]  Harrie Foreman, MD Inpatient    Advance Directive Documentation     Most Recent Value  Type of Advance Directive  Healthcare Power of Attorney  Pre-existing out of facility DNR order (yellow form or pink MOST form)  -  "MOST" Form in Place?  -     Family Communication: As per critical  care specialist Disposition Plan: To be determined  Consultants:  Critical care specialist  Antibiotics:  Cefepime  Time spent: 28 minutes.  Case discussed with critical care specialist and nursing staff.  Nurse to coordinate wife  to bring in his teeth.  Kemia Wendel [facility]

## 2018-11-28 ENCOUNTER — Ambulatory Visit: Payer: Medicare HMO | Admitting: Oncology

## 2018-11-28 ENCOUNTER — Other Ambulatory Visit: Payer: Medicare HMO

## 2018-11-28 LAB — CBC
HCT: 30.6 % — ABNORMAL LOW (ref 39.0–52.0)
Hemoglobin: 9.9 g/dL — ABNORMAL LOW (ref 13.0–17.0)
MCH: 33.3 pg (ref 26.0–34.0)
MCHC: 32.4 g/dL (ref 30.0–36.0)
MCV: 103 fL — ABNORMAL HIGH (ref 80.0–100.0)
Platelets: 144 10*3/uL — ABNORMAL LOW (ref 150–400)
RBC: 2.97 MIL/uL — ABNORMAL LOW (ref 4.22–5.81)
RDW: 15 % (ref 11.5–15.5)
WBC: 18 10*3/uL — ABNORMAL HIGH (ref 4.0–10.5)
nRBC: 0.2 % (ref 0.0–0.2)

## 2018-11-28 LAB — PROCALCITONIN: Procalcitonin: 0.85 ng/mL

## 2018-11-28 LAB — CULTURE, RESPIRATORY W GRAM STAIN: Special Requests: NORMAL

## 2018-11-28 LAB — GLUCOSE, CAPILLARY
Glucose-Capillary: 269 mg/dL — ABNORMAL HIGH (ref 70–99)
Glucose-Capillary: 311 mg/dL — ABNORMAL HIGH (ref 70–99)

## 2018-11-28 LAB — CULTURE, BLOOD (ROUTINE X 2)
Culture: NO GROWTH
Culture: NO GROWTH
Special Requests: ADEQUATE
Special Requests: ADEQUATE

## 2018-11-28 LAB — CULTURE, RESPIRATORY

## 2018-11-28 MED ORDER — DILTIAZEM HCL ER COATED BEADS 120 MG PO CP24
120.0000 mg | ORAL_CAPSULE | Freq: Every day | ORAL | Status: DC
  Administered 2018-11-28: 120 mg via ORAL
  Filled 2018-11-28: qty 1

## 2018-11-28 MED ORDER — BENZONATATE 200 MG PO CAPS: 200.0000 mg | ORAL_CAPSULE | Freq: Three times a day (TID) | ORAL | 0 refills | Status: DC

## 2018-11-28 MED ORDER — GUAIFENESIN-CODEINE 100-10 MG/5ML PO SOLN: 10.0000 mL | ORAL | 0 refills | Status: DC | PRN

## 2018-11-28 MED ORDER — DILTIAZEM HCL ER COATED BEADS 120 MG PO CP24: 120.0000 mg | ORAL_CAPSULE | Freq: Every day | ORAL | 0 refills | Status: DC

## 2018-11-28 MED ORDER — CEFUROXIME AXETIL 500 MG PO TABS: 500.0000 mg | ORAL_TABLET | Freq: Two times a day (BID) | ORAL | 0 refills | Status: AC

## 2018-11-28 MED ORDER — PREDNISONE 10 MG (21) PO TBPK: ORAL_TABLET | ORAL | 0 refills | Status: DC

## 2018-11-28 MED ORDER — GUAIFENESIN ER 600 MG PO TB12: 600.0000 mg | ORAL_TABLET | Freq: Two times a day (BID) | ORAL | 0 refills | Status: AC

## 2018-11-28 NOTE — Care Management Important Message (Signed)
Important Message  Patient Details  Name: Tony Watson MRN: [id number] Date of Birth: Feb 06, 1951   Medicare Important Message Given:  Yes    Juliann Pulse A Tateanna Bach 11/28/2018, 11:00 AM

## 2018-11-28 NOTE — Progress Notes (Signed)
Pharmacy Antibiotic Note  RASHAN ROUNSAVILLE is a 68 y.o. male admitted on 11/23/2018 with sepsis and chest pain. Sepsis possibly secondary to pneumonia. PMH significant for leukemia, COPD, interstitial lung disease. Pharmacy has been consulted for cefepime dosing.  Plan: Day 6 on Cefepime from 2 g IV q8h. Will Continue.   Height: 5\' 11"  (180.3 cm) Weight: 194 lb 10.7 oz (88.3 kg) IBW/kg (Calculated) : 75.3  Temp (24hrs), Avg:97.5 F (36.4 C), Min:97.4 F (36.3 C), Max:97.7 F (36.5 C)  Recent Labs  Lab 11/23/18 0735  11/23/18 1452 11/23/18 2059 11/24/18 0003 11/24/18 0449 11/24/18 1120 11/25/18 0521 11/26/18 0425 11/27/18 0455 11/28/18 0335  WBC 26.9*  --   --   --   --  67.1*  --  40.2* 22.8* 19.6* 18.0*  CREATININE 0.89  --   --   --   --  1.57*  --  0.73 0.76 0.89  --   LATICACIDVEN  --    < > 3.8* 4.5* 3.5* 3.5* 5.0*  --   --   --   --    < > = values in this interval not displayed.    Estimated Creatinine Clearance: 87 mL/min (by C-G formula based on SCr of 0.89 mg/dL).    Allergies  Allergen Reactions  . Prilosec [Omeprazole] Diarrhea    Antimicrobials this admission: Zosyn x1 4/15 Vancomycin 4/15 >> 4/16 Cefepime 4/15 >> Flagyl 4/16 >> 4/16  Dose adjustments this admission: 4/16 Decreased Cefepime from 2 g IV q8h to q12h  Microbiology results: 4/15 BCx: NGTD SARS-CoV 2 negative MRSA PCR : negative  Resp Cx: pending.   Thank you for allowing pharmacy to be a part of this patient's care.   Pearla Dubonnet, PharmD  11/28/2018 7:28 AM

## 2018-11-28 NOTE — TOC Transition Note (Signed)
Transition of Care [facility]) - CM/SW Discharge Note   Patient Details  Name: LELAN CUSH MRN: [id number] Date of Birth: 07/15/1951  [facility] CM/SW Contact:  Su Hilt, RN Phone Number: 11/28/2018, 9:30 AM   Clinical Narrative:     Patient to DC home with self care, he declines HH due to the [facility] Virus, He has all DME needs at home and has chronic o2 with [employer] at home,  Has Dr. Lorelei Pont PCP and Dr. Fletcher Anon cardiologist [facility] and is able to afford meds Lives at home with wife and has transportation with family   Final next level of care: Home/Self Care Barriers to Discharge: Barriers Resolved   Patient Goals and CMS Choice        Discharge Placement                       Discharge Plan and Services   Discharge Planning Services: CM Consult                      Social Determinants of Health (SDOH) Interventions     Readmission Risk Interventions Readmission Risk Prevention Plan 11/24/2018 05/08/2018  Transportation Screening Complete Complete  Medication Review [employer]) Complete Complete  PCP or Specialist appointment within 3-5 days of discharge Complete Complete  HRI or Home Care Consult Complete Complete  SW Recovery Care/Counseling Consult Patient refused Complete  Palliative Care Screening Not Applicable Not Complete  Medication Reconcilation (Pharmacy) - Complete  [place] Not Applicable Not Complete  Some recent data might be hidden

## 2018-11-28 NOTE — Progress Notes (Signed)
PT Cancellation Note  Patient Details Name: Tony Watson MRN: [id number] DOB: 1951-07-09   Cancelled Treatment:    Reason Eval/Treat Not Completed: Patient declined, no reason specified Patient refused PT as he is "leaving soon"  Shelton Silvas PT, DPT Shelton Silvas 11/28/2018, 12:15 PM

## 2018-11-28 NOTE — Discharge Summary (Addendum)
[employer] - [place] at [facility], 68 y.o., DOB 07/22/1951, MRN [id number]. Admission date: 11/23/2018 Discharge Date 11/28/2018 Primary MD Owens Loffler, MD Admitting Physician Saundra Shelling, MD  Admission Diagnosis  Tachycardia [R00.0] SIRS (systemic inflammatory response syndrome) ([place]) [R65.10] Community acquired pneumonia of left lower lobe of lung ([place]) [J18.1]  Discharge Diagnosis   Active Problems:   Sepsis ([place]) due to pneumonia Acute on chronic hypoxic respiratory failure Interstitial lung disease Leukocytosis Diabetes type 2 CML COPD Coronary artery disease  Hospital Course  Tony Watson  is a 68 y.o. male with a known history of coronary artery disease, arthritis, combined systolic diastolic heart failure, chronic respiratory failure on oxygen via nasal cannula at 2 L, chronic myelocytic leukemia, COPD, type 2 diabetes mellitus, diabetic neuropathy, GERD, hypertension, hyperlipidemia, interstitial lung disease presented to the emergency room for shortness of breath and mid back pain.  Patient was short of breath low on oxygen saturation.  He was put on oxygen via nasal cannula initially and then bumped up to oxygen via nonrebreather at 15 L with.  Also put on BiPAP in the emergency room but again transition back to nonrebreather mask.  Patient had a CT scan of the chest which showed pneumonia.  He was admitted for sepsis and pneumonia.  He was treated with antibiotics and steroids and nebulizer therapy.  He has shown significant improvement.  He is on 4 L now and doing well.  Currently back to baseline.  Coronavirus was checked and it was negative.           Consults  None  Significant Tests:  See full reports for all details    Dg Chest 2 View  Result Date: 11/27/2018 CLINICAL DATA:  Worsening cough EXAM: CHEST - 2 VIEW COMPARISON:  11/26/2018 FINDINGS: Bilateral chronic interstitial thickening. No pleural effusion or  pneumothorax. No focal consolidation. Stable cardiomediastinal silhouette. Prior median sternotomy. No acute osseous abnormality. IMPRESSION: 1. No acute cardiopulmonary disease. 2. Chronic interstitial lung disease. Electronically Signed   By: Kathreen Devoid   On: 11/27/2018 12:35   Dg Chest Port 1 View  Result Date: 11/26/2018 CLINICAL DATA:  Acute respiratory failure. EXAM: PORTABLE CHEST 1 VIEW COMPARISON:  November 25, 2018 FINDINGS: Chronic interstitial lung disease. Mildly more focal opacity in the left retrocardiac region is similar. No other interval changes. No pneumothorax. IMPRESSION: 1. Chronic interstitial lung disease. 2. Mildly more prominent left retrocardiac opacity may remain, better seen on the CT scan from November 23, 2018. No other change. Electronically Signed   By: Dorise Bullion III M.D   On: 11/26/2018 08:09   Dg Chest Port 1 View  Result Date: 11/25/2018 CLINICAL DATA:  Acute respiratory failure EXAM: PORTABLE CHEST 1 VIEW COMPARISON:  11/24/2018 FINDINGS: Normal heart size. Heterogeneous but predominately interstitial opacities throughout both lungs are stable. No pneumothorax or pleural effusion. IMPRESSION: Bilateral pulmonary opacities are stable. Electronically Signed   By: Marybelle Killings M.D.   On: 11/25/2018 09:23   Dg Chest Port 1 View  Result Date: 11/24/2018 CLINICAL DATA:  Pneumothorax. EXAM: PORTABLE CHEST 1 VIEW COMPARISON:  Radiograph of same day. FINDINGS: Stable cardiomediastinal silhouette. Sternotomy wires are noted. No pneumothorax or pleural effusion is noted. Stable diffuse interstitial densities are noted throughout both lungs. Stable left lower lobe opacity is noted. Bony thorax is unremarkable. IMPRESSION: Stable diffuse interstitial densities are noted throughout both lungs most likely representing chronic interstitial lung disease or scarring. Stable asymmetric opacity is seen  in left lung base which may represent acute inflammation. Electronically Signed    By: Marijo Conception M.D.   On: 11/24/2018 09:02   Dg Chest Port 1 View  Result Date: 11/24/2018 CLINICAL DATA:  Acute respiratory failure. EXAM: PORTABLE CHEST 1 VIEW COMPARISON:  CT chest 11/23/2018.  One-view chest x-ray 11/23/2018. FINDINGS: From the heart size is upper limits of normal. Persistent left lower lobe consolidation is present. Chronic interstitial coarsening is noted. There is no significant interval change. IMPRESSION: 1. Stable left lower lobe airspace disease, likely infection. 2. Chronic interstitial coarsening without other significant consolidated disease. Electronically Signed   By: San Morelle M.D.   On: 11/24/2018 06:42   Dg Chest Port 1 View  Result Date: 11/23/2018 CLINICAL DATA:  Shaking with low back pain and dry heaving. Shortness of breath. EXAM: PORTABLE CHEST 1 VIEW COMPARISON:  05/08/2018 and 05/01/2018 radiographs.  CT 05/13/2017. FINDINGS: 0734 hours. Two views obtained. The heart size and mediastinal contours are stable status post median sternotomy. There is stable chronic interstitial lung disease without evidence of superimposed edema, confluent airspace opacity or pleural effusion. There is no pneumothorax. Telemetry leads overlie the chest. 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 No evidence of acute superimposed process. Electronically Signed   By: Richardean Sale M.D.   On: 11/23/2018 08:23   Ct Angio Chest/abd/pel For Dissection W And/or Wo Contrast  Result Date: 11/23/2018 CLINICAL DATA:  Acute chest and upper back pain and shortness of breath. EXAM: CT ANGIOGRAPHY CHEST, ABDOMEN AND PELVIS TECHNIQUE: Multidetector CT imaging through the chest, abdomen and pelvis was performed using the standard protocol during bolus administration of intravenous contrast. Multiplanar reconstructed images and MIPs were obtained and reviewed to evaluate the vascular anatomy. CONTRAST:   166m ISOVUE-370 IOPAMIDOL (ISOVUE-370) INJECTION 76% COMPARISON:  Chest x-ray dated 11/23/2018 and chest CT dated 05/13/2017 and CT scan of the abdomen and pelvis dated 02/24/2017 FINDINGS: CTA CHEST FINDINGS Cardiovascular: There is extensive coronary artery calcification. Minimal aortic atherosclerosis. Heart size is normal. No pericardial effusion. No evidence of thoracic aortic dissection. Mediastinum/Nodes: Chronic lobulation of the thyroid gland, unchanged. Trachea and esophagus are normal. No significant hilar or mediastinal adenopathy. Lungs/Pleura: There is a dense consolidative infiltrate in the posterolateral aspect of the left lower lobe. This is superimposed on chronic interstitial and obstructive lung disease. No effusions. Musculoskeletal: Previous median sternotomy. No acute bone abnormality. Review of the MIP images confirms the above findings. CTA ABDOMEN AND PELVIS FINDINGS VASCULAR Aorta: Aortic atherosclerosis. Celiac: Widely patent. SMA: Widely patent. Renals: There are at least 4 widely patent right renal arteries. There is a single widely patent left renal artery. IMA: IMA is patent.  Dense calcification at the origin. Inflow: Patent without evidence of aneurysm, dissection, vasculitis or significant stenosis. Veins: No obvious venous abnormality within the limitations of this arterial phase study. Review of the MIP images confirms the above findings. NON-VASCULAR Hepatobiliary: No focal liver abnormality is seen. No gallstones, gallbladder wall thickening, or biliary dilatation. Pancreas: Unremarkable. No pancreatic ductal dilatation or surrounding inflammatory changes. Spleen: Normal in size without focal abnormality. Adrenals/Urinary Tract: Adrenal glands are unremarkable. Kidneys are normal, without renal calculi, focal lesion, or hydronephrosis. Bladder is unremarkable. Stomach/Bowel: Stomach is within normal limits. Appendix appears normal. No evidence of bowel wall thickening,  distention, or inflammatory changes. Scattered diverticula in the descending and sigmoid portions of the colon. Lymphatic: No adenopathy. Reproductive: Prostate is unremarkable. Other: No abdominal  wall hernia or abnormality. No abdominopelvic ascites. Musculoskeletal: No acute abnormality. Severe spinal stenosis at L2-3, L3-4, and L4-5. Diffuse degenerative disc and joint disease in the lumbar spine. Review of the MIP images confirms the above findings. IMPRESSION: 1. Dense consolidative infiltrate in the posterolateral aspect of the left lower lobe consistent pneumonia. 2. No evidence of aortic dissection. 3.  Extensive aortic Atherosclerosis (ICD10-I70.0). 4. Colonic diverticulosis. 5. Multilevel severe lumbar spinal stenosis. Electronically Signed   By: Lorriane Shire M.D.   On: 11/23/2018 10:39   [place] Ekg Site Rite  Result Date: 11/24/2018 If Site Rite image not attached, placement could not be confirmed due to current cardiac rhythm.      Today   Subjective:   Neva Seat patient's breathing is much improved currently back to baseline Objective:   Blood pressure (!) 143/87, pulse (!) 111, temperature 97.7 F (36.5 C), temperature source Oral, resp. rate 20, height '5\' 11"'$  (1.803 m), weight 88.3 kg, SpO2 92 %.  .  Intake/Output Summary (Last 24 hours) at 11/28/2018 1137 Last data filed at 11/28/2018 0900 Gross per 24 hour  Intake 1820 ml  Output 1050 ml  Net 770 ml    Exam VITAL SIGNS: Blood pressure (!) 143/87, pulse (!) 111, temperature 97.7 F (36.5 C), temperature source Oral, resp. rate 20, height '5\' 11"'$  (1.803 m), weight 88.3 kg, SpO2 92 %.  GENERAL:  68 y.o.-year-old patient lying in the bed with no acute distress.  EYES: Pupils equal, round, reactive to light and accommodation. No scleral icterus. Extraocular muscles intact.  HEENT: Head atraumatic, normocephalic. Oropharynx and nasopharynx clear.  NECK:  Supple, no jugular venous distention. No thyroid enlargement, no  tenderness.  LUNGS: Normal breath sounds bilaterally, no wheezing, rales,rhonchi or crepitation. No use of accessory muscles of respiration.  CARDIOVASCULAR: S1, S2 normal. No murmurs, rubs, or gallops.  ABDOMEN: Soft, nontender, nondistended. Bowel sounds present. No organomegaly or mass.  EXTREMITIES: No pedal edema, cyanosis, or clubbing.  NEUROLOGIC: Cranial nerves II through XII are intact. Muscle strength 5/5 in all extremities. Sensation intact. Gait not checked.  PSYCHIATRIC: The patient is alert and oriented x 3.  SKIN: No obvious rash, lesion, or ulcer.   Data Review     CBC w Diff:  Lab Results  Component Value Date   WBC 18.0 (H) 11/28/2018   HGB 9.9 (L) 11/28/2018   HGB 13.4 01/21/2014   HCT 30.6 (L) 11/28/2018   HCT 38.7 (L) 01/21/2014   PLT 144 (L) 11/28/2018   PLT 141 (L) 01/21/2014   LYMPHOPCT 3 11/26/2018   LYMPHOPCT 17.1 02/09/2013   BANDSPCT 3 10/18/2017   MONOPCT 9 11/26/2018   MONOPCT 32.6 02/09/2013   EOSPCT 0 11/26/2018   EOSPCT 0.2 02/09/2013   BASOPCT 0 11/26/2018   BASOPCT 0.2 02/09/2013   CMP:  Lab Results  Component Value Date   NA 136 11/27/2018   NA 133 (L) 07/25/2018   NA 139 01/21/2014   K 4.0 11/27/2018   K 3.7 01/21/2014   CL 99 11/27/2018   CL 107 01/21/2014   CO2 26 11/27/2018   CO2 25 01/21/2014   BUN 24 (H) 11/27/2018   BUN 22 07/25/2018   BUN 14 01/21/2014   CREATININE 0.89 11/27/2018   CREATININE 0.91 01/21/2014   PROT 8.1 11/23/2018   PROT 9.5 (H) 02/05/2013   ALBUMIN 4.0 11/23/2018   ALBUMIN 4.6 02/05/2013   BILITOT 1.0 11/23/2018   BILITOT 0.6 02/05/2013   ALKPHOS 47 11/23/2018  ALKPHOS 46 (L) 02/05/2013   AST 29 11/23/2018   AST 42 (H) 02/05/2013   ALT 39 11/23/2018   ALT 51 02/05/2013  .  Micro Results Recent Results (from the past 240 hour(s))  SARS Coronavirus 2 [facility] order, Performed in [place] hospital lab)     Status: None   Collection Time: 11/23/18  8:52 AM  Result Value Ref Range Status    SARS Coronavirus 2 NEGATIVE NEGATIVE Final    Comment: (NOTE) If result is NEGATIVE SARS-CoV-2 target nucleic acids are NOT DETECTED. The SARS-CoV-2 RNA is generally detectable in upper and lower  respiratory specimens during the acute phase of infection. The lowest  concentration of SARS-CoV-2 viral copies this assay can detect is 250  copies / mL. A negative result does not preclude SARS-CoV-2 infection  and should not be used as the sole basis for treatment or other  patient management decisions.  A negative result may occur with  improper specimen collection / handling, submission of specimen other  than nasopharyngeal swab, presence of viral mutation(s) within the  areas targeted by this assay, and inadequate number of viral copies  (<250 copies / mL). A negative result must be combined with clinical  observations, patient history, and epidemiological information. If result is POSITIVE SARS-CoV-2 target nucleic acids are DETECTED. The SARS-CoV-2 RNA is generally detectable in upper and lower  respiratory specimens dur ing the acute phase of infection.  Positive  results are indicative of active infection with SARS-CoV-2.  Clinical  correlation with patient history and other diagnostic information is  necessary to determine patient infection status.  Positive results do  not rule out bacterial infection or co-infection with other viruses. If result is PRESUMPTIVE POSTIVE SARS-CoV-2 nucleic acids MAY BE PRESENT.   A presumptive positive result was obtained on the submitted specimen  and confirmed on repeat testing.  While 2019 novel coronavirus  (SARS-CoV-2) nucleic acids may be present in the submitted sample  additional confirmatory testing may be necessary for epidemiological  and / or clinical management purposes  to differentiate between  SARS-CoV-2 and other Sarbecovirus currently known to infect humans.  If clinically indicated additional testing with an alternate test   methodology [phone]) is advised. The SARS-CoV-2 RNA is generally  detectable in upper and lower respiratory sp ecimens during the acute  phase of infection. The expected result is Negative. Fact Sheet for Patients:  StrictlyIdeas.no Fact Sheet for Healthcare Providers: BankingDealers.co.za This test is not yet approved or cleared by the [place] FDA and has been authorized for detection and/or diagnosis of SARS-CoV-2 by FDA under an Emergency Use Authorization (EUA).  This EUA will remain in effect (meaning this test can be used) for the duration of the COVID-19 declaration under Section 564(b)(1) of the Act, 21 U.S.C. section 360bbb-3(b)(1), unless the authorization is terminated or revoked sooner. Performed at [facility], [name] [address] [place], [place] [address]   Blood culture (routine x 2)     Status: None   Collection Time: 11/23/18 10:20 AM  Result Value Ref Range Status   Specimen Description BLOOD RIGHT ANTECUBITAL  Final   Special Requests   Final    BOTTLES DRAWN AEROBIC AND ANAEROBIC Blood Culture adequate volume   Culture   Final    NO GROWTH 5 DAYS Performed at [facility], [address], [place], [place] [address]    Report Status 11/28/2018 FINAL  Final  Blood culture (routine x 2)  Status: None   Collection Time: 11/23/18 10:20 AM  Result Value Ref Range Status   Specimen Description BLOOD BLOOD LEFT FOREARM  Final   Special Requests   Final    BOTTLES DRAWN AEROBIC AND ANAEROBIC Blood Culture adequate volume   Culture   Final    NO GROWTH 5 DAYS Performed at [facility], [address] [place], [place] [address]    Report Status 11/28/2018 FINAL  Final  Urine Culture     Status: None   Collection Time: 11/23/18 10:47 AM  Result Value Ref Range Status   Specimen Description   Final    URINE, RANDOM Performed at [facility], [address],  [place], [place] [address]    Special Requests   Final    Normal Performed at [facility], [address], [place], [place] [address]    Culture   Final    NO GROWTH Performed at [facility], [place] [address] [place], [place] [address]    Report Status 11/25/2018 FINAL  Final  MRSA PCR Screening     Status: None   Collection Time: 11/23/18  2:41 PM  Result Value Ref Range Status   MRSA by PCR NEGATIVE NEGATIVE Final    Comment:        The GeneXpert MRSA Assay (FDA approved for NASAL specimens only), is one component of a comprehensive MRSA colonization surveillance program. It is not intended to diagnose MRSA infection nor to guide or monitor treatment for MRSA infections. Performed at [facility], [address] [place], [place] [address]   Expectorated sputum assessment w rflx to resp cult     Status: None   Collection Time: 11/24/18 11:39 AM  Result Value Ref Range Status   Specimen Description EXPECTORATED SPUTUM  Final   Special Requests Normal  Final   Sputum evaluation   Final    THIS SPECIMEN IS ACCEPTABLE FOR SPUTUM CULTURE Performed at [facility], [address], [place], [place] [address]    Report Status 11/24/2018 FINAL  Final  Culture, respiratory     Status: None   Collection Time: 11/24/18 11:39 AM  Result Value Ref Range Status   Specimen Description   Final    EXPECTORATED SPUTUM Performed at [facility], [address], [place], [place] [address]    Special Requests   Final    Normal Reflexed from [id number] Performed at [facility], [address] [place], [place] [address]    Gram Stain   Final    FEW WBC PRESENT,BOTH PMN AND MONONUCLEAR RARE GRAM POSITIVE COCCI IN PAIRS Performed at [facility], [place] [address] [place], [place] [address]    Culture   Final    FEW SERRATIA MARCESCENS RARE STAPHYLOCOCCUS AUREUS    Report Status 11/28/2018 FINAL  Final   Organism ID, Bacteria  SERRATIA MARCESCENS  Final   Organism ID, Bacteria STAPHYLOCOCCUS AUREUS  Final      Susceptibility   Staphylococcus aureus - MIC*    CIPROFLOXACIN <=0.5 SENSITIVE Sensitive     ERYTHROMYCIN >=8 RESISTANT Resistant     GENTAMICIN <=0.5 SENSITIVE Sensitive     OXACILLIN 0.5 SENSITIVE Sensitive     TETRACYCLINE <=1 SENSITIVE Sensitive     VANCOMYCIN 1 SENSITIVE Sensitive     TRIMETH/SULFA <=10 SENSITIVE Sensitive     CLINDAMYCIN <=0.25 SENSITIVE Sensitive     RIFAMPIN <=0.5 SENSITIVE Sensitive     Inducible Clindamycin NEGATIVE  Sensitive     * RARE STAPHYLOCOCCUS AUREUS   Serratia marcescens - MIC*    CEFAZOLIN >=64 RESISTANT Resistant     CEFEPIME <=1 SENSITIVE Sensitive     CEFTAZIDIME <=1 SENSITIVE Sensitive     CEFTRIAXONE <=1 SENSITIVE Sensitive     CIPROFLOXACIN <=0.25 SENSITIVE Sensitive     GENTAMICIN <=1 SENSITIVE Sensitive     TRIMETH/SULFA <=20 SENSITIVE Sensitive     * FEW SERRATIA MARCESCENS        Code Status Orders  (From admission, onward)         Start     Ordered   11/23/18 1541  Do not attempt resuscitation (DNR)  Continuous    Question Answer Comment  In the event of cardiac or respiratory ARREST Do not call a "code blue"   In the event of cardiac or respiratory ARREST Do not perform Intubation, CPR, defibrillation or ACLS   In the event of cardiac or respiratory ARREST Use medication by any route, position, wound care, and other measures to relive pain and suffering. May use oxygen, suction and manual treatment of airway obstruction as needed for comfort.      11/23/18 1540        Code Status History    Date Active Date Inactive Code Status Order ID Comments User Context   05/03/2018 1107 05/08/2018 1718 DNR [id number]  Nelva Bush, MD Inpatient   05/03/2018 0955 05/03/2018 1107 Full Code [id number]  Nelva Bush, MD Inpatient   05/02/2018 1609 05/03/2018 0955 DNR [id number]  Jimmy Footman, NP Inpatient   05/02/2018 1422 05/02/2018 1609  Full Code [id number]  Jimmy Footman, NP Inpatient   05/02/2018 1303 05/02/2018 1422 DNR [id number]  Jimmy Footman, NP Inpatient   05/01/2018 1254 05/02/2018 1303 Full Code [id number]  Nicholes Mango, MD Inpatient   03/24/2018 1948 03/28/2018 1340 Full Code [id number]  Saundra Shelling, MD Inpatient   03/03/2018 0328 03/06/2018 1716 Full Code [id number]  Harrie Foreman, MD Inpatient    Advance Directive Documentation     Most Recent Value  Type of Advance Directive  Healthcare Power of Attorney  Pre-existing out of facility DNR order (yellow form or pink MOST form)  -  "MOST" Form in Place?  -            Discharge Medications   Allergies as of 11/28/2018      Reactions   Prilosec [omeprazole] Diarrhea      Medication List    TAKE these medications   acetaminophen 500 MG tablet Commonly known as:  TYLENOL Take 1,000 mg by mouth daily as needed for moderate pain or headache.   aspirin 81 MG chewable tablet Chew 1 tablet (81 mg total) by mouth daily.   atorvastatin 40 MG tablet Commonly known as:  LIPITOR Take 1 tablet (40 mg total) by mouth daily at 6 PM. What changed:  when to take this   benzonatate 200 MG capsule Commonly known as:  TESSALON Take 1 capsule (200 mg total) by mouth 3 (three) times daily.   blood glucose meter kit and supplies Dispense based on patient and insurance preference. Use to check blood sugar daily.   budesonide 0.5 MG/2ML nebulizer solution Commonly known as:  PULMICORT Take 2 mLs (0.5 mg total) by nebulization 2 (two) times daily.   carvedilol 12.5 MG tablet Commonly known as:  COREG Take 1 tablet (12.5 mg total) by mouth 2 (two) times daily.   cefUROXime 500 MG tablet Commonly known as:  CEFTIN Take 1 tablet (500 mg total) by mouth 2 (two) times daily for 7 days.   cetirizine 10 MG chewable tablet Commonly known as:  ZYRTEC Chew 10 mg by mouth daily.   diltiazem 120 MG 24 hr capsule Commonly known as:  CARDIZEM  CD Take 1 capsule (120 mg total) by mouth daily.   formoterol 20 MCG/2ML nebulizer solution Commonly known as:  PERFOROMIST Take 2 mLs (20 mcg total) by nebulization 2 (two) times daily. J44.9   gabapentin 300 MG capsule Commonly known as:  NEURONTIN Take 1 capsule (300 mg total) by mouth at bedtime.   guaiFENesin 600 MG 12 hr tablet Commonly known as:  MUCINEX Take 1 tablet (600 mg total) by mouth 2 (two) times daily for 10 days.   guaiFENesin-codeine 100-10 MG/5ML syrup Take 10 mLs by mouth every 4 (four) hours as needed for cough.   ipratropium-albuterol 0.5-2.5 (3) MG/3ML Soln Commonly known as:  DUONEB Take 3 mLs by nebulization every 4 (four) hours as needed. DX:J44.9 What changed:    reasons to take this  additional instructions   ivabradine 5 MG Tabs tablet Commonly known as:  Corlanor Take 1 tablet (5 mg total) by mouth 2 (two) times daily with a meal.   LORazepam 0.5 MG tablet Commonly known as:  ATIVAN Take 1 tablet (0.5 mg total) by mouth 3 (three) times daily as needed for anxiety.   losartan 25 MG tablet Commonly known as:  COZAAR Take 0.5 tablets (12.5 mg total) by mouth daily.   multivitamin with minerals Tabs tablet Take 1 tablet by mouth daily.   nystatin 100000 UNIT/ML suspension Commonly known as:  MYCOSTATIN TAKE AS DIRECTED 5 MLS (500,000 UNITS TOTAL) IN THE MOUTH OR THROAT 4 TIMES DAILY What changed:  See the new instructions.   omeprazole 20 MG capsule Commonly known as:  PRILOSEC Take 20 mg by mouth daily.   ondansetron 4 MG disintegrating tablet Commonly known as:  Zofran ODT Take 1-2 tablets (4-8 mg total) by mouth every 6 (six) hours as needed for nausea or vomiting.   predniSONE 10 MG tablet Commonly known as:  DELTASONE Take 1 tablet (10 mg total) by mouth daily with breakfast. What changed:  Another medication with the same name was added. Make sure you understand how and when to take each.   predniSONE 10 MG (21) Tbpk  tablet Commonly known as:  STERAPRED UNI-PAK 21 TAB Start at '60mg'$  taper by '10mg'$  until complete What changed:  You were already taking a medication with the same name, and this prescription was added. Make sure you understand how and when to take each.   spironolactone 25 MG tablet Commonly known as:  ALDACTONE Take 1 tablet (25 mg total) by mouth daily.   tiZANidine 4 MG tablet Commonly known as:  ZANAFLEX Take 1 tablet (4 mg total) by mouth Nightly.   torsemide 20 MG tablet Commonly known as:  DEMADEX Take 3 tablets (60 mg total) by mouth daily.   vitamin C 500 MG tablet Commonly known as:  ASCORBIC ACID Take 500 mg by mouth daily.   Vitamin D 50 MCG (2000 UT) Caps Take 2,000 Units by mouth every evening.            Durable Medical Equipment  (From admission, onward)         Start     Ordered   11/28/18 0825  DME Oxygen  Once    Comments:  Pt already on this already  Question  Answer Comment  Mode or (Route) Nasal cannula   Liters per Minute 4   Frequency Continuous (stationary and portable oxygen unit needed)   Oxygen delivery system Gas      11/28/18 0825             Total Time in preparing paper work, data evaluation and todays exam - 23 minutes  Dustin Flock M.D on 11/28/2018 at 11:37 AM [place]  [phone]

## 2018-11-28 NOTE — Progress Notes (Signed)
Foley Catheter removed at approximately 2345 on 4/19 which was inserted at beginning of hospital admission due to acute urinary retention.   Pnt does not have urinary rentention issues at home and no chronic or intermittent cath use.   Void trial in place as pnt is anticipating discharge in next day or so.

## 2018-11-29 ENCOUNTER — Telehealth: Payer: Self-pay | Admitting: Family Medicine

## 2018-11-29 NOTE — Telephone Encounter (Addendum)
Attempted to reach patient on primary phone. Attempt unsuccessful. Left message with contact info.

## 2018-11-29 NOTE — Telephone Encounter (Signed)
-----   Message from Tony Pen, LPN sent at 2/83/1517  8:48 AM EDT ----- Regarding: Hospital F/U appt Dr. Lorelei Pont,   Do you wish to have a hospital f/u appt with patient? It was suggested he needed one on discharge summary.  Please advise.   Katha Cabal

## 2018-11-29 NOTE — Telephone Encounter (Signed)
Transitional Care Management Follow-up Telephone Call    Date discharged? 11/28/18  How have you been since you were released from the hospital? [place].   Any patient concerns? Patient began on 11/28/18 using 5L oxygen via [place].    Items Reviewed:  Medications reviewed: Yes  Allergies reviewed: Yes  Dietary changes reviewed: Yes  Referrals reviewed: Yes   Functional Questionnaire:  Independent - I Dependent - D    Activities of Daily Living (ADLs):    Personal hygiene - I Dressing - I Eating - I Maintaining continence - I Transferring - I (ambulates with cane)  Independent Activities of Daily Living (iADLs): Basic communication skills - I Transportation - I (majority of driving by spouse) Meal preparation  - D (spouse) Shopping - I Housework - D (spouse) Managing medications - I  Managing personal finances - D (spouse)   Confirmed importance and date/time of follow-up visits scheduled YES  Provider Appointment booked with PCP 11/30/18 @ 1400  Confirmed with patient if condition begins to worsen call PCP or go to the ER.  Patient was given the office number and encouraged to call back with question or concerns: YES

## 2018-11-29 NOTE — Telephone Encounter (Signed)
This is an exceptionally complicated medical case.  In almost all similar cases, I would have close follow-up face to face.   In this case, the patient have severe end stage lung disease, chronic resp failure on 4 L of oxygen. Recent PNA and SIRS inpatient.    Given we have a known + Covid-19 employee in our office, this represents a potentially fatal risk for this patient. Particularly in a setting where he was Covid-19 negative in the hospital.  Please set up a DOXY appointment with him, 40 minutes.  If I have to, I can covert to a face to face appointment.  Thank-you.

## 2018-11-29 NOTE — Telephone Encounter (Signed)
Transitional care management call based on discharge summary as follows:   Admission date: 11/23/2018 Discharge Date 11/28/2018 Primary MD Owens Loffler, MD Admitting Physician Saundra Shelling, MD  Admission Diagnosis  Tachycardia [R00.0] SIRS (systemic inflammatory response syndrome) ([place]) [R65.10] Community acquired pneumonia of left lower lobe of lung ([place]) [J18.1]  Discharge Diagnosis   Active Problems:   Sepsis ([place]) due to pneumonia Acute on chronic hypoxic respiratory failure Interstitial lung disease Leukocytosis Diabetes type 2 CML COPD Coronary artery disease

## 2018-11-30 ENCOUNTER — Ambulatory Visit (INDEPENDENT_AMBULATORY_CARE_PROVIDER_SITE_OTHER): Payer: Medicare HMO | Admitting: Family Medicine

## 2018-11-30 ENCOUNTER — Telehealth: Payer: Self-pay | Admitting: *Deleted

## 2018-11-30 ENCOUNTER — Encounter: Payer: Self-pay | Admitting: Family Medicine

## 2018-11-30 VITALS — BP 143/70 | HR 83 | Temp 97.8°F | Ht 70.0 in | Wt 185.0 lb

## 2018-11-30 DIAGNOSIS — J9601 Acute respiratory failure with hypoxia: Secondary | ICD-10-CM

## 2018-11-30 DIAGNOSIS — E43 Unspecified severe protein-calorie malnutrition: Secondary | ICD-10-CM

## 2018-11-30 DIAGNOSIS — J849 Interstitial pulmonary disease, unspecified: Secondary | ICD-10-CM

## 2018-11-30 DIAGNOSIS — J181 Lobar pneumonia, unspecified organism: Secondary | ICD-10-CM

## 2018-11-30 DIAGNOSIS — C9310 Chronic myelomonocytic leukemia not having achieved remission: Secondary | ICD-10-CM

## 2018-11-30 DIAGNOSIS — J189 Pneumonia, unspecified organism: Secondary | ICD-10-CM

## 2018-11-30 DIAGNOSIS — R6510 Systemic inflammatory response syndrome (SIRS) of non-infectious origin without acute organ dysfunction: Secondary | ICD-10-CM | POA: Diagnosis not present

## 2018-11-30 DIAGNOSIS — I5022 Chronic systolic (congestive) heart failure: Secondary | ICD-10-CM

## 2018-11-30 DIAGNOSIS — J449 Chronic obstructive pulmonary disease, unspecified: Secondary | ICD-10-CM

## 2018-11-30 NOTE — Telephone Encounter (Signed)
Left message for [name] that Dr. Lorelei Pont decided after their virtual visit today, that he does not want to restart the Metformin.  He would also recommend that [name] not even check his blood sugar for at least a month because his numbers will be elevated from the steroids.  I ask that he call us back if he has any questions.

## 2018-11-30 NOTE — Progress Notes (Signed)
Tony Ketchum T. Malini Flemings, MD Primary Care and [place] at [facility] [place], [address] Phone: [phone]  FAX: [place] - 68 y.o. male  MRN [id number]  Date of Birth: 10/05/1950  Visit Date: 11/30/2018  PCP: Tony Loffler, MD  Referred by: Tony Loffler, MD Chief Complaint  Patient presents with  . Hospitalization Follow-up   Virtual Visit via Video Note:  I connected with  Tony Watson on 11/30/2018  2:00 PM EDT by a video enabled telemedicine application and verified that I am speaking with the correct person using two identifiers.   Location patient: home computer, tablet, or smartphone Location provider: work or home office Consent: Verbal consent directly obtained from Tony Watson. Persons participating in the virtual visit: patient, provider  I discussed the limitations of evaluation and management by telemedicine and the availability of in person appointments. The patient expressed understanding and agreed to proceed.  History of Present Illness:  Admission date: 11/23/2018 Discharge Date 11/28/2018  Tony Watson was recently admitted to the hospital with an acute left lower lobe pneumonia and Sirs.  He got sepsis, and had a acute respiratory failure, on top of his chronic respiratory failure.  Ultimately, he required 15 L of oxygen on a nonrebreather.  He also had to use BiPAP in the emergency room, but ultimately was transitioned back to the nonrebreather.  Pneumonia was confirmed on chest CT.  He was admitted with sepsis, and he was treated with IV antibiotics as well as steroids and nebulizers as well as oxygen.  At the time of discharge, he was back on his baseline 4 L of oxygen.  Today, he tells me that he is feeling okay, his breathing is essentially back to baseline and he has been on either 4 or 5 L of oxygen since he has been at home.  He has all of his nebulizer solutions as  well as all of his inhalers.  Currently on Ceftin and increased dosage of steroids.  Pred DS pak.  Lab Results  Component Value Date   HGBA1C 8.6 (H) 11/25/2018     Review of Systems as above: See pertinent positives and pertinent negatives per HPI No acute distress verbally  Past Medical History, Surgical History, Social History, Family History, Problem List, Medications, and Allergies have been reviewed and updated if relevant.   Observations/Objective/Exam:  An attempt was made to discern vital signs over the phone and per patient if applicable and possible.   General:    Alert, Oriented, appears well and in no acute distress HEENT:     Atraumatic, conjunctiva clear, no obvious abnormalities on inspection of external nose and ears.  Neck:    Normal movements of the head and neck Pulmonary:     On inspection no signs of respiratory distress, breathing rate appears normal, no obvious gross SOB, gasping or wheezing Cardiovascular:    No obvious cyanosis Musculoskeletal:    Moves all visible extremities without noticeable abnormality Psych / Neurological:     Pleasant and cooperative, no obvious depression or anxiety, speech and thought processing grossly intact  Assessment and Plan:  SIRS (systemic inflammatory response syndrome) (HCC)  Acute respiratory failure with hypoxia (HCC)  ILD (interstitial lung disease) (HCC)  Protein-calorie malnutrition, severe  Stage 4 very severe COPD by GOLD classification ([place])  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  He understands that this was a near fatal event.  He is back to baseline.  Normally, he understands that I would have urgently  evaluated him in the office, but get given the COVID-19 outbreak, we did the best we could with a telehealth conference.  He was COVID-19 negative in the hospital.  Continue with all antibiotics, steroids, and wean to baseline 10 mg of prednisone daily.  He asked me about starting back his metformin, and given his clinical picture and an A1c of 8, I want to hold on this for now.  I discussed the assessment and treatment plan with the patient. The patient was provided an opportunity to ask questions and all were answered. The patient agreed with the plan and demonstrated an understanding of the instructions.   The patient was advised to call back or seek an in-person evaluation if the symptoms worsen or if the condition fails to improve as anticipated.  Follow-up: prn unless noted otherwise below No follow-ups on file.  No orders of the defined types were placed in this encounter.  No orders of the defined types were placed in this encounter.   Signed,  Tony Deed. Demani Weyrauch, MD

## 2018-12-01 ENCOUNTER — Other Ambulatory Visit: Payer: Self-pay | Admitting: Cardiovascular Disease

## 2018-12-01 ENCOUNTER — Encounter: Payer: Self-pay | Admitting: Family Medicine

## 2018-12-17 ENCOUNTER — Inpatient Hospital Stay
Admission: EM | Admit: 2018-12-17 | Discharge: 2018-12-21 | DRG: 871 | Disposition: A | Discharge status: Home / Self Care | Payer: Medicare HMO | Attending: Internal Medicine | Admitting: Internal Medicine

## 2018-12-17 ENCOUNTER — Emergency Department: Payer: Medicare HMO

## 2018-12-17 ENCOUNTER — Other Ambulatory Visit: Payer: Self-pay

## 2018-12-17 DIAGNOSIS — N179 Acute kidney failure, unspecified: Secondary | ICD-10-CM | POA: Diagnosis not present

## 2018-12-17 DIAGNOSIS — Z79899 Other long term (current) drug therapy: Secondary | ICD-10-CM

## 2018-12-17 DIAGNOSIS — J209 Acute bronchitis, unspecified: Secondary | ICD-10-CM

## 2018-12-17 DIAGNOSIS — Z8673 Personal history of transient ischemic attack (TIA), and cerebral infarction without residual deficits: Secondary | ICD-10-CM

## 2018-12-17 DIAGNOSIS — E871 Hypo-osmolality and hyponatremia: Secondary | ICD-10-CM | POA: Diagnosis present

## 2018-12-17 DIAGNOSIS — I5042 Chronic combined systolic (congestive) and diastolic (congestive) heart failure: Secondary | ICD-10-CM | POA: Diagnosis not present

## 2018-12-17 DIAGNOSIS — J849 Interstitial pulmonary disease, unspecified: Secondary | ICD-10-CM | POA: Diagnosis present

## 2018-12-17 DIAGNOSIS — Y95 Nosocomial condition: Secondary | ICD-10-CM | POA: Diagnosis present

## 2018-12-17 DIAGNOSIS — A419 Sepsis, unspecified organism: Principal | ICD-10-CM | POA: Diagnosis present

## 2018-12-17 DIAGNOSIS — R05 Cough: Secondary | ICD-10-CM | POA: Diagnosis not present

## 2018-12-17 DIAGNOSIS — J449 Chronic obstructive pulmonary disease, unspecified: Secondary | ICD-10-CM | POA: Diagnosis not present

## 2018-12-17 DIAGNOSIS — I2510 Atherosclerotic heart disease of native coronary artery without angina pectoris: Secondary | ICD-10-CM | POA: Diagnosis present

## 2018-12-17 DIAGNOSIS — Z7982 Long term (current) use of aspirin: Secondary | ICD-10-CM

## 2018-12-17 DIAGNOSIS — J440 Chronic obstructive pulmonary disease with acute lower respiratory infection: Secondary | ICD-10-CM | POA: Diagnosis not present

## 2018-12-17 DIAGNOSIS — I959 Hypotension, unspecified: Secondary | ICD-10-CM | POA: Diagnosis present

## 2018-12-17 DIAGNOSIS — Z951 Presence of aortocoronary bypass graft: Secondary | ICD-10-CM

## 2018-12-17 DIAGNOSIS — Z20828 Contact with and (suspected) exposure to other viral communicable diseases: Secondary | ICD-10-CM | POA: Diagnosis not present

## 2018-12-17 DIAGNOSIS — E1165 Type 2 diabetes mellitus with hyperglycemia: Secondary | ICD-10-CM | POA: Diagnosis present

## 2018-12-17 DIAGNOSIS — Z7951 Long term (current) use of inhaled steroids: Secondary | ICD-10-CM | POA: Diagnosis not present

## 2018-12-17 DIAGNOSIS — E785 Hyperlipidemia, unspecified: Secondary | ICD-10-CM | POA: Diagnosis present

## 2018-12-17 DIAGNOSIS — E872 Acidosis: Secondary | ICD-10-CM | POA: Diagnosis present

## 2018-12-17 DIAGNOSIS — C9210 Chronic myeloid leukemia, BCR/ABL-positive, not having achieved remission: Secondary | ICD-10-CM | POA: Diagnosis present

## 2018-12-17 DIAGNOSIS — B370 Candidal stomatitis: Secondary | ICD-10-CM | POA: Diagnosis present

## 2018-12-17 DIAGNOSIS — Z7952 Long term (current) use of systemic steroids: Secondary | ICD-10-CM

## 2018-12-17 DIAGNOSIS — I255 Ischemic cardiomyopathy: Secondary | ICD-10-CM | POA: Diagnosis present

## 2018-12-17 DIAGNOSIS — E1143 Type 2 diabetes mellitus with diabetic autonomic (poly)neuropathy: Secondary | ICD-10-CM | POA: Diagnosis present

## 2018-12-17 DIAGNOSIS — I252 Old myocardial infarction: Secondary | ICD-10-CM

## 2018-12-17 DIAGNOSIS — Z803 Family history of malignant neoplasm of breast: Secondary | ICD-10-CM

## 2018-12-17 DIAGNOSIS — R0602 Shortness of breath: Secondary | ICD-10-CM | POA: Diagnosis not present

## 2018-12-17 DIAGNOSIS — Z87891 Personal history of nicotine dependence: Secondary | ICD-10-CM

## 2018-12-17 DIAGNOSIS — Z8249 Family history of ischemic heart disease and other diseases of the circulatory system: Secondary | ICD-10-CM

## 2018-12-17 DIAGNOSIS — J441 Chronic obstructive pulmonary disease with (acute) exacerbation: Secondary | ICD-10-CM

## 2018-12-17 DIAGNOSIS — Z66 Do not resuscitate: Secondary | ICD-10-CM | POA: Diagnosis present

## 2018-12-17 DIAGNOSIS — D539 Nutritional anemia, unspecified: Secondary | ICD-10-CM | POA: Diagnosis present

## 2018-12-17 DIAGNOSIS — Z9119 Patient's noncompliance with other medical treatment and regimen: Secondary | ICD-10-CM

## 2018-12-17 DIAGNOSIS — K219 Gastro-esophageal reflux disease without esophagitis: Secondary | ICD-10-CM | POA: Diagnosis present

## 2018-12-17 DIAGNOSIS — J9621 Acute and chronic respiratory failure with hypoxia: Secondary | ICD-10-CM | POA: Diagnosis present

## 2018-12-17 DIAGNOSIS — J9620 Acute and chronic respiratory failure, unspecified whether with hypoxia or hypercapnia: Secondary | ICD-10-CM | POA: Diagnosis present

## 2018-12-17 DIAGNOSIS — J189 Pneumonia, unspecified organism: Secondary | ICD-10-CM | POA: Diagnosis not present

## 2018-12-17 DIAGNOSIS — Z955 Presence of coronary angioplasty implant and graft: Secondary | ICD-10-CM

## 2018-12-17 DIAGNOSIS — Z8774 Personal history of (corrected) congenital malformations of heart and circulatory system: Secondary | ICD-10-CM

## 2018-12-17 DIAGNOSIS — I110 Hypertensive heart disease with heart failure: Secondary | ICD-10-CM | POA: Diagnosis present

## 2018-12-17 DIAGNOSIS — Z9981 Dependence on supplemental oxygen: Secondary | ICD-10-CM

## 2018-12-17 LAB — GLUCOSE, CAPILLARY: Glucose-Capillary: 423 mg/dL — ABNORMAL HIGH (ref 70–99)

## 2018-12-17 LAB — CBC WITH DIFFERENTIAL/PLATELET
Abs Immature Granulocytes: 3.57 10*3/uL — ABNORMAL HIGH (ref 0.00–0.07)
Basophils Absolute: 0.2 10*3/uL — ABNORMAL HIGH (ref 0.0–0.1)
Basophils Relative: 0 %
Eosinophils Absolute: 0 10*3/uL (ref 0.0–0.5)
Eosinophils Relative: 0 %
HCT: 28.5 % — ABNORMAL LOW (ref 39.0–52.0)
Hemoglobin: 9.6 g/dL — ABNORMAL LOW (ref 13.0–17.0)
Immature Granulocytes: 8 %
Lymphocytes Relative: 6 %
Lymphs Abs: 2.9 10*3/uL (ref 0.7–4.0)
MCH: 34 pg (ref 26.0–34.0)
MCHC: 33.7 g/dL (ref 30.0–36.0)
MCV: 101.1 fL — ABNORMAL HIGH (ref 80.0–100.0)
Monocytes Absolute: 9.3 10*3/uL — ABNORMAL HIGH (ref 0.1–1.0)
Monocytes Relative: 20 %
Neutro Abs: 30.4 10*3/uL — ABNORMAL HIGH (ref 1.7–7.7)
Neutrophils Relative %: 66 %
Platelets: 80 10*3/uL — ABNORMAL LOW (ref 150–400)
RBC: 2.82 MIL/uL — ABNORMAL LOW (ref 4.22–5.81)
RDW: 16.4 % — ABNORMAL HIGH (ref 11.5–15.5)
Smear Review: DECREASED
WBC: 46.4 10*3/uL — ABNORMAL HIGH (ref 4.0–10.5)
nRBC: 0 % (ref 0.0–0.2)

## 2018-12-17 LAB — COMPREHENSIVE METABOLIC PANEL
ALT: 47 U/L — ABNORMAL HIGH (ref 0–44)
AST: 34 U/L (ref 15–41)
Albumin: 3.1 g/dL — ABNORMAL LOW (ref 3.5–5.0)
Alkaline Phosphatase: 46 U/L (ref 38–126)
Anion gap: 10 (ref 5–15)
BUN: 32 mg/dL — ABNORMAL HIGH (ref 8–23)
CO2: 29 mmol/L (ref 22–32)
Calcium: 7.7 mg/dL — ABNORMAL LOW (ref 8.9–10.3)
Chloride: 90 mmol/L — ABNORMAL LOW (ref 98–111)
Creatinine, Ser: 1.69 mg/dL — ABNORMAL HIGH (ref 0.61–1.24)
GFR calc Af Amer: 48 mL/min — ABNORMAL LOW (ref >60)
GFR calc non Af Amer: 41 mL/min — ABNORMAL LOW (ref >60)
Glucose, Bld: 249 mg/dL — ABNORMAL HIGH (ref 70–99)
Potassium: 4.2 mmol/L (ref 3.5–5.1)
Sodium: 129 mmol/L — ABNORMAL LOW (ref 135–145)
Total Bilirubin: 1.2 mg/dL (ref 0.3–1.2)
Total Protein: 7 g/dL (ref 6.5–8.1)

## 2018-12-17 LAB — LIPASE, BLOOD: Lipase: 23 U/L (ref 11–51)

## 2018-12-17 LAB — PROTIME-INR
INR: 1.1 (ref 0.8–1.2)
Prothrombin Time: 14.5 seconds (ref 11.4–15.2)

## 2018-12-17 LAB — FIBRINOGEN: Fibrinogen: 499 mg/dL — ABNORMAL HIGH (ref 210–475)

## 2018-12-17 LAB — PROCALCITONIN: Procalcitonin: 3.3 ng/mL

## 2018-12-17 LAB — LACTIC ACID, PLASMA
Lactic Acid, Venous: 2.7 mmol/L (ref 0.5–1.9)
Lactic Acid, Venous: 2.3 mmol/L (ref 0.5–1.9)
Lactic Acid, Venous: 6 mmol/L (ref 0.5–1.9)

## 2018-12-17 LAB — SARS CORONAVIRUS 2 BY RT PCR (HOSPITAL ORDER, PERFORMED IN ~~LOC~~ HOSPITAL LAB): SARS Coronavirus 2: NEGATIVE

## 2018-12-17 LAB — FIBRIN DERIVATIVES D-DIMER (ARMC ONLY): Fibrin derivatives D-dimer (ARMC): 1311.92 ng/mL (FEU) — ABNORMAL HIGH (ref 0.00–499.00)

## 2018-12-17 LAB — TRIGLYCERIDES: Triglycerides: 93 mg/dL (ref <150)

## 2018-12-17 LAB — FERRITIN: Ferritin: 983 ng/mL — ABNORMAL HIGH (ref 24–336)

## 2018-12-17 LAB — LACTATE DEHYDROGENASE: LDH: 384 U/L — ABNORMAL HIGH (ref 98–192)

## 2018-12-17 MED ORDER — IPRATROPIUM-ALBUTEROL 0.5-2.5 (3) MG/3ML IN SOLN
3.0000 mL | Freq: Once | RESPIRATORY_TRACT | Status: AC
  Administered 2018-12-17: 15:00:00 3 mL via RESPIRATORY_TRACT
  Filled 2018-12-17: qty 3

## 2018-12-17 MED ORDER — METHYLPREDNISOLONE SODIUM SUCC 125 MG IJ SOLR
60.0000 mg | Freq: Two times a day (BID) | INTRAMUSCULAR | Status: DC
  Administered 2018-12-18: 60 mg via INTRAVENOUS
  Filled 2018-12-17: qty 2

## 2018-12-17 MED ORDER — VITAMIN D3 25 MCG (1000 UNIT) PO TABS
2000.0000 [IU] | ORAL_TABLET | Freq: Every evening | ORAL | Status: DC
  Administered 2018-12-18 – 2018-12-20 (×3): 2000 [IU] via ORAL
  Filled 2018-12-17 (×6): qty 2

## 2018-12-17 MED ORDER — TORSEMIDE 20 MG PO TABS: 60.0000 mg | ORAL_TABLET | Freq: Every day | ORAL | Status: DC

## 2018-12-17 MED ORDER — GABAPENTIN 300 MG PO CAPS
300.0000 mg | ORAL_CAPSULE | Freq: Every day | ORAL | Status: DC
  Administered 2018-12-17 – 2018-12-20 (×4): 300 mg via ORAL
  Filled 2018-12-17 (×5): qty 1

## 2018-12-17 MED ORDER — LOSARTAN POTASSIUM 25 MG PO TABS: 12.5000 mg | ORAL_TABLET | Freq: Every day | ORAL | Status: DC

## 2018-12-17 MED ORDER — LORAZEPAM 0.5 MG PO TABS
0.5000 mg | ORAL_TABLET | Freq: Three times a day (TID) | ORAL | Status: DC | PRN
  Administered 2018-12-18 – 2018-12-21 (×4): 0.5 mg via ORAL
  Filled 2018-12-17 (×4): qty 1

## 2018-12-17 MED ORDER — GUAIFENESIN-CODEINE 100-10 MG/5ML PO SOLN
10.0000 mL | ORAL | Status: DC | PRN
  Administered 2018-12-19 – 2018-12-20 (×5): 10 mL via ORAL
  Filled 2018-12-17 (×6): qty 10

## 2018-12-17 MED ORDER — DILTIAZEM HCL ER COATED BEADS 120 MG PO CP24
120.0000 mg | ORAL_CAPSULE | Freq: Every day | ORAL | Status: DC
  Administered 2018-12-18 – 2018-12-19 (×2): 120 mg via ORAL
  Filled 2018-12-17 (×3): qty 1

## 2018-12-17 MED ORDER — TIZANIDINE HCL 4 MG PO TABS
4.0000 mg | ORAL_TABLET | Freq: Every day | ORAL | Status: DC
  Administered 2018-12-17 – 2018-12-20 (×4): 4 mg via ORAL
  Filled 2018-12-17 (×7): qty 1

## 2018-12-17 MED ORDER — PANTOPRAZOLE SODIUM 40 MG PO TBEC
40.0000 mg | DELAYED_RELEASE_TABLET | Freq: Every day | ORAL | Status: DC
  Administered 2018-12-17 – 2018-12-21 (×5): 40 mg via ORAL
  Filled 2018-12-17 (×5): qty 1

## 2018-12-17 MED ORDER — TIOTROPIUM BROMIDE MONOHYDRATE 18 MCG IN CAPS
18.0000 ug | ORAL_CAPSULE | Freq: Every day | RESPIRATORY_TRACT | Status: DC
  Administered 2018-12-18 – 2018-12-21 (×3): 18 ug via RESPIRATORY_TRACT
  Filled 2018-12-17 (×2): qty 5

## 2018-12-17 MED ORDER — METHYLPREDNISOLONE SODIUM SUCC 125 MG IJ SOLR
125.0000 mg | INTRAMUSCULAR | Status: AC
  Administered 2018-12-17: 125 mg via INTRAVENOUS
  Filled 2018-12-17: qty 2

## 2018-12-17 MED ORDER — ACETAMINOPHEN 500 MG PO TABS: 1000.0000 mg | ORAL_TABLET | Freq: Every day | ORAL | Status: DC | PRN

## 2018-12-17 MED ORDER — SODIUM CHLORIDE 0.9 % IV BOLUS
1000.0000 mL | Freq: Once | INTRAVENOUS | Status: AC
  Administered 2018-12-17: 14:00:00 1000 mL via INTRAVENOUS

## 2018-12-17 MED ORDER — VANCOMYCIN HCL 10 G IV SOLR
2000.0000 mg | Freq: Once | INTRAVENOUS | Status: AC
  Administered 2018-12-17: 2000 mg via INTRAVENOUS
  Filled 2018-12-17: qty 2000

## 2018-12-17 MED ORDER — SODIUM CHLORIDE 0.9 % IV SOLN
2.0000 g | Freq: Two times a day (BID) | INTRAVENOUS | Status: DC
  Administered 2018-12-18: 2 g via INTRAVENOUS
  Filled 2018-12-17 (×3): qty 2

## 2018-12-17 MED ORDER — ATORVASTATIN CALCIUM 20 MG PO TABS
40.0000 mg | ORAL_TABLET | Freq: Every day | ORAL | Status: DC
  Administered 2018-12-18 – 2018-12-20 (×3): 40 mg via ORAL
  Filled 2018-12-17 (×3): qty 2

## 2018-12-17 MED ORDER — LORATADINE 10 MG PO TABS
10.0000 mg | ORAL_TABLET | Freq: Every day | ORAL | Status: DC
  Administered 2018-12-17 – 2018-12-18 (×2): 10 mg via ORAL
  Filled 2018-12-17 (×2): qty 1

## 2018-12-17 MED ORDER — VANCOMYCIN HCL IN DEXTROSE 1-5 GM/200ML-% IV SOLN
1000.0000 mg | Freq: Once | INTRAVENOUS | Status: DC
  Filled 2018-12-17: qty 200

## 2018-12-17 MED ORDER — BUDESONIDE 0.25 MG/2ML IN SUSP
0.2500 mg | Freq: Two times a day (BID) | RESPIRATORY_TRACT | Status: DC
  Administered 2018-12-17: 0.25 mg via RESPIRATORY_TRACT
  Filled 2018-12-17: qty 2

## 2018-12-17 MED ORDER — ADULT MULTIVITAMIN W/MINERALS CH
1.0000 | ORAL_TABLET | Freq: Every day | ORAL | Status: DC
  Administered 2018-12-18 – 2018-12-21 (×4): 1 via ORAL
  Filled 2018-12-17 (×4): qty 1

## 2018-12-17 MED ORDER — SODIUM CHLORIDE 0.9 % IV SOLN
2.0000 g | Freq: Once | INTRAVENOUS | Status: AC
  Administered 2018-12-17: 2 g via INTRAVENOUS
  Filled 2018-12-17: qty 2

## 2018-12-17 MED ORDER — DOCUSATE SODIUM 100 MG PO CAPS: 100.0000 mg | ORAL_CAPSULE | Freq: Two times a day (BID) | ORAL | Status: DC | PRN

## 2018-12-17 MED ORDER — SPIRONOLACTONE 25 MG PO TABS: 25.0000 mg | ORAL_TABLET | Freq: Every day | ORAL | Status: DC

## 2018-12-17 MED ORDER — BENZONATATE 100 MG PO CAPS
200.0000 mg | ORAL_CAPSULE | Freq: Three times a day (TID) | ORAL | Status: DC
  Administered 2018-12-17 – 2018-12-18 (×2): 200 mg via ORAL
  Filled 2018-12-17 (×2): qty 2

## 2018-12-17 MED ORDER — HEPARIN SODIUM (PORCINE) 5000 UNIT/ML IJ SOLN
5000.0000 [IU] | Freq: Three times a day (TID) | INTRAMUSCULAR | Status: DC
  Administered 2018-12-17 – 2018-12-19 (×5): 5000 [IU] via SUBCUTANEOUS
  Filled 2018-12-17 (×4): qty 1

## 2018-12-17 MED ORDER — BUDESONIDE 0.5 MG/2ML IN SUSP
0.5000 mg | Freq: Two times a day (BID) | RESPIRATORY_TRACT | Status: DC
  Administered 2018-12-18: 08:00:00 0.5 mg via RESPIRATORY_TRACT
  Filled 2018-12-17: qty 2

## 2018-12-17 MED ORDER — IPRATROPIUM-ALBUTEROL 0.5-2.5 (3) MG/3ML IN SOLN
3.0000 mL | RESPIRATORY_TRACT | Status: DC | PRN
  Administered 2018-12-18 – 2018-12-20 (×7): 3 mL via RESPIRATORY_TRACT
  Filled 2018-12-17 (×7): qty 3

## 2018-12-17 MED ORDER — ASPIRIN 81 MG PO CHEW
81.0000 mg | CHEWABLE_TABLET | Freq: Every day | ORAL | Status: DC
  Administered 2018-12-17 – 2018-12-21 (×5): 81 mg via ORAL
  Filled 2018-12-17 (×5): qty 1

## 2018-12-17 MED ORDER — INSULIN ASPART 100 UNIT/ML ~~LOC~~ SOLN
0.0000 [IU] | SUBCUTANEOUS | Status: DC
  Administered 2018-12-17: 20 [IU] via SUBCUTANEOUS
  Administered 2018-12-18: 11 [IU] via SUBCUTANEOUS
  Administered 2018-12-18: 20 [IU] via SUBCUTANEOUS
  Administered 2018-12-18: 15 [IU] via SUBCUTANEOUS
  Filled 2018-12-17 (×4): qty 1

## 2018-12-17 MED ORDER — ONDANSETRON 4 MG PO TBDP
4.0000 mg | ORAL_TABLET | Freq: Four times a day (QID) | ORAL | Status: DC | PRN
  Filled 2018-12-17: qty 2

## 2018-12-17 MED ORDER — VITAMIN C 500 MG PO TABS
500.0000 mg | ORAL_TABLET | Freq: Every day | ORAL | Status: DC
  Administered 2018-12-18 – 2018-12-21 (×4): 500 mg via ORAL
  Filled 2018-12-17 (×4): qty 1

## 2018-12-17 MED ORDER — CARVEDILOL 12.5 MG PO TABS
12.5000 mg | ORAL_TABLET | Freq: Two times a day (BID) | ORAL | Status: DC
  Administered 2018-12-17 – 2018-12-21 (×8): 12.5 mg via ORAL
  Filled 2018-12-17 (×8): qty 1

## 2018-12-17 MED ORDER — IVABRADINE HCL 5 MG PO TABS
5.0000 mg | ORAL_TABLET | Freq: Two times a day (BID) | ORAL | Status: DC
  Administered 2018-12-18 – 2018-12-21 (×6): 5 mg via ORAL
  Filled 2018-12-17 (×9): qty 1

## 2018-12-17 MED ORDER — VANCOMYCIN HCL 10 G IV SOLR
1250.0000 mg | INTRAVENOUS | Status: DC
  Administered 2018-12-18: 1250 mg via INTRAVENOUS
  Filled 2018-12-17: qty 1250

## 2018-12-17 NOTE — Consult Note (Signed)
Pharmacy Antibiotic Note  Tony Watson is a 68 y.o. male admitted on 12/17/2018 with pneumonia.  Pharmacy has been consulted for vancomycin and cefepime dosing. He was recently admitted to [facility] for PNA in mid-April 2020 where he was treated with vancomycin for a short period of time and cefepime for 6 days  Plan: 1) Vancomycin 1250 mg IV Q 24 hrs following 2000 mg loading dose Goal AUC 400-550, T1/2 16.3 h Expected AUC: 462.6 SCr used: 1.69 Cmin: 11.8  2) cefepime 2 grams IV every 12 hours  Height: 5\' 11"  (180.3 cm) Weight: 195 lb 1.7 oz (88.5 kg) IBW/kg (Calculated) : 75.3  Temp (24hrs), Avg:98.7 F (37.1 C), Min:97.7 F (36.5 C), Max:99.7 F (37.6 C)  Recent Labs  Lab 12/17/18 1415 12/17/18 1420 12/17/18 1648  WBC  --  46.4*  --   CREATININE  --  1.69*  --   LATICACIDVEN 2.7*  --  2.3*    Estimated Creatinine Clearance: 45.8 mL/min (A) (by C-G formula based on SCr of 1.69 mg/dL (H)).     Antimicrobials this admission: 5/9 cefepime >>  5/9 vancomycin  >>   Microbiology results: 5/9 BCx: pending 5/9 UCx: pending  5/9 SARS CoV-2: pending    Thank you for allowing pharmacy to be a part of this patient's care.  Pearla Dubonnet, PharmD Clinical Pharmacist 12/17/2018 7:02 PM

## 2018-12-17 NOTE — ED Provider Notes (Signed)
[facility] Emergency Department Provider Note   ____________________________________________   First MD Initiated Contact with Patient 12/17/18 1211     (approximate)  I have reviewed the triage vital signs and the nursing notes.   HISTORY  Chief Complaint Cough and Shortness of Breath    HPI Tony Watson is a 68 y.o. male here for evaluation of cough fever and increasing shortness of breath  Patient reports that he was recently treated for pneumonia, has been off antibiotics for couple weeks.  At home he uses about 4 to 5 L nasal cannula but he has had to increase this to about 7 over the last couple of days due to increasing shortness of breath with a productive cough and also some fevers that he is noticed for the last 2 days.  He has been isolating from coronavirus is not exposed to it that he knows of.  He reports his symptoms feel very similar to when he had pneumonia.  He has gained weight over the last year but no sudden changes denies any leg swelling, has not noticed that his symptoms are worse when he tries to lay down.  No chest pain no abdominal pain.  Reports just short of breath coughing frequently thinks he might have pneumonia   Past Medical History:  Diagnosis Date   Arthritis    CAD (coronary artery disease)    a. inf MI 11/99 (in [place]) w/ PCI to [facility]; b. LHC 2004 [facility]): EF 50%, patent RCA stent, no obs dz; c. MV 2008: EF 42%, inf infarct, no ischemia; d. R/LHC 9/19: 3-v dz w/ mild dif dz LM, m-dLAD 50, dLAD 60, D2 80, RI 80, mRCA-1 90, mRCA-2 20, patent RCA stent, CO/CI 3.7/2   Chronic combined systolic (congestive) and diastolic (congestive) heart failure ([place])    a. 7/29019 Echo: EF 25-30%, sev diff HK, mild MR mod dil LA, mildly reduced RVSF, elevated CVP   Chronic respiratory failure with hypoxia (HCC)    a. on 2L supplemental oxygen via [place] followed by pulmonology   CML (chronic myelocytic leukemia) (HCC)    COPD  (chronic obstructive pulmonary disease) ([place])    Diabetes mellitus without complication ([place])    Diabetic autonomic neuropathy associated with type 2 diabetes mellitus ([place]) 03/27/2015   Diverticulosis 2002   GERD (gastroesophageal reflux disease)    HTN (hypertension)    Hyperlipidemia    ILD (interstitial lung disease) ([place])    Ischemic cardiomyopathy    Medical non-compliance    Smoker    Spinal stenosis    TIA (transient ischemic attack)    a. 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 ([place]) 01/27/2016   Diabetic autonomic neuropathy associated with type 2 diabetes mellitus ([place]) 03/27/2015   History of inferior MI (myocardial infarction) 10/14/2013   CAD (coronary artery disease), native coronary artery    History of noncompliance with medical treatment 05/30/2012   Stage 4 very severe COPD by GOLD classification ([place]) 03/23/2011   ALLERGIC RHINITIS CAUSE  UNSPECIFIED 07/31/2010   Type 2 diabetes mellitus with vascular disease ([place]) 01/02/2010   Ex-smoker 04/17/2009   TRANSIENT ISCHEMIC ATTACKS, HX OF 04/17/2009   Hyperlipidemia 12/19/2008    Past Surgical History:  Procedure Laterality Date   BACK SURGERY     patient denies-just lumbar punctures   BRONCHOSCOPY     CARDIAC CATHETERIZATION  11/99   CI per PMH   CATARACT EXTRACTION W/PHACO Right 11/24/2016   Procedure: CATARACT EXTRACTION PHACO AND INTRAOCULAR LENS PLACEMENT ([place]);  Surgeon: Birder Robson, MD;   Location: [facility] ORS;  Service: Ophthalmology;  Laterality: Right;  [place] 1:04.3 AP% 22.3 CDE 14.35 Fluid pack lot # [id number]   CATARACT EXTRACTION W/PHACO Left 12/29/2016   Procedure: CATARACT EXTRACTION PHACO AND INTRAOCULAR LENS PLACEMENT (IOC);  Surgeon: Birder Robson, MD;  Location: [facility] ORS;  Service: Ophthalmology;  Laterality: Left;  [place] 00:52 AP% 18.5 CDE 9.67 Fluid pack lot # [id number]   COLONOSCOPY     COLONOSCOPY WITH PROPOFOL N/A 05/25/2017   Procedure: COLONOSCOPY WITH PROPOFOL;  Surgeon: Jonathon Bellows, MD;  Location: [facility] ENDOSCOPY;  Service: Gastroenterology;  Laterality: N/A;   CORONARY ANGIOPLASTY     STENT   CORONARY ARTERY BYPASS GRAFT     stent   ESOPHAGOGASTRODUODENOSCOPY (EGD) WITH PROPOFOL N/A 05/25/2017   Procedure: ESOPHAGOGASTRODUODENOSCOPY (EGD) WITH PROPOFOL;  Surgeon: Jonathon Bellows, MD;  Location: [facility] ENDOSCOPY;  Service: Gastroenterology;  Laterality: N/A;   EYE SURGERY     FLEXIBLE BRONCHOSCOPY N/A 05/19/2017   Procedure: FLEXIBLE BRONCHOSCOPY;  Surgeon: Wilhelmina Mcardle, MD;  Location: [facility] ORS;  Service: Pulmonary;  Laterality: N/A;   open heart surgery  2004   PFO repair   RIGHT/LEFT HEART CATH AND CORONARY ANGIOGRAPHY N/A 05/03/2018   Procedure: RIGHT/LEFT HEART CATH AND CORONARY ANGIOGRAPHY;  Surgeon: Nelva Bush, MD;  Location: [place] CV LAB;  Service: Cardiovascular;  Laterality: N/A;   UPPER GI ENDOSCOPY      Prior to Admission medications   Medication Sig Start Date End Date Taking? Authorizing Provider  acetaminophen (TYLENOL) 500 MG tablet Take 1,000 mg by mouth daily as needed for moderate pain or headache.   Yes [provider]  aspirin 81 MG chewable tablet Chew 1 tablet (81 mg total) by mouth daily. 05/09/18  Yes Wieting, Richard, MD  atorvastatin (LIPITOR) 40 MG tablet TAKE 1 TABLET (40 MG TOTAL) BY MOUTH DAILY AT 6 PM. 12/01/18  Yes Gollan, Kathlene November, MD  budesonide (PULMICORT) 0.5 MG/2ML nebulizer solution Take 2  mLs (0.5 mg total) by nebulization 2 (two) times daily. 03/06/18 03/06/19 Yes Vaughan Basta, MD  cetirizine (ZYRTEC) 10 MG chewable tablet Chew 10 mg by mouth daily.   Yes [provider]  Cholecalciferol (VITAMIN D) 2000 units CAPS Take 2,000 Units by mouth every evening.   Yes [provider]  ivabradine (CORLANOR) 5 MG TABS tablet Take 1 tablet (5 mg total) by mouth 2 (two) times daily with a meal. 08/08/18  Yes Hackney, Tina A, FNP  LORazepam (ATIVAN) 0.5 MG tablet Take 1 tablet (0.5 mg total) by mouth 3 (three) times daily as needed for anxiety. 11/11/18  Yes Copland, Frederico Hamman, MD  omeprazole (PRILOSEC) 20 MG capsule Take 20 mg by mouth daily.   Yes [provider]  spironolactone (ALDACTONE) 25 MG tablet TAKE 1 TABLET BY MOUTH EVERY DAY 12/01/18  Yes Gollan, Kathlene November, MD  torsemide (DEMADEX) 20 MG tablet Take 3 tablets (60 mg total) by mouth daily. 10/03/18 01/01/19 Yes Alisa Graff, FNP  vitamin C (ASCORBIC ACID) 500 MG tablet Take 500 mg by mouth daily.   Yes [provider]  benzonatate (TESSALON) 200 MG capsule Take 1 capsule (200 mg total) by mouth 3 (three) times daily. 11/28/18   Dustin Flock, MD  blood glucose meter kit and supplies Dispense based on patient and insurance preference. Use to check blood sugar daily. 11/16/18   Copland, Frederico Hamman, MD  carvedilol (COREG) 12.5 MG tablet Take 1 tablet (12.5 mg total) by mouth 2 (two) times daily. 11/14/18 02/12/19  Minna Merritts, MD  diltiazem (CARDIZEM CD) 120 MG 24 hr capsule Take 1 capsule (120 mg total) by mouth daily. 11/28/18   Dustin Flock, MD  formoterol (PERFOROMIST) 20 MCG/2ML nebulizer solution Take 2 mLs (20 mcg total) by nebulization 2 (two) times daily. J44.9 06/10/18   [name], MD  gabapentin (NEURONTIN) 300 MG capsule Take 1 capsule (300 mg total) by mouth at bedtime. 11/03/18   Copland, Frederico Hamman, MD  guaiFENesin-codeine 100-10 MG/5ML syrup Take 10 mLs by mouth every 4 (four) hours as  needed for cough. 11/28/18   Dustin Flock, MD  ipratropium-albuterol (DUONEB) 0.5-2.5 (3) MG/3ML SOLN Take 3 mLs by nebulization every 4 (four) hours as needed. DX:J44.9 Patient taking differently: Take 3 mLs by nebulization every 4 (four) hours as needed (for shortness of breath/wheezing).  11/04/18   [name], MD  losartan (COZAAR) 25 MG tablet Take 0.5 tablets (12.5 mg total) by mouth daily. 11/14/18   Minna Merritts, MD  Multiple Vitamin (MULTIVITAMIN WITH MINERALS) TABS tablet Take 1 tablet by mouth daily. 03/07/18   Vaughan Basta, MD  nystatin (MYCOSTATIN) 100000 UNIT/ML suspension TAKE AS DIRECTED 5 MLS (500,000 UNITS TOTAL) IN THE MOUTH OR THROAT 4 TIMES DAILY Patient taking differently: Use as directed 5 mLs in the mouth or throat 4 (four) times daily.  10/12/18   Copland, Frederico Hamman, MD  ondansetron (ZOFRAN ODT) 4 MG disintegrating tablet Take 1-2 tablets (4-8 mg total) by mouth every 6 (six) hours as needed for nausea or vomiting. 11/16/18   Copland, Frederico Hamman, MD  predniSONE (DELTASONE) 10 MG tablet Take 1 tablet (10 mg total) by mouth daily with breakfast. 11/11/18   Copland, Frederico Hamman, MD  predniSONE (STERAPRED UNI-PAK 21 TAB) 10 MG (21) TBPK tablet Start at 74m taper by 175muntil complete 11/28/18   PaDustin FlockMD  tiZANidine (ZANAFLEX) 4 MG tablet Take 1 tablet (4 mg total) by mouth Nightly. Patient not taking: Reported on 12/17/2018 03/23/18   Copland, SpFrederico HammanMD    Allergies Prilosec [omeprazole]  Family History  Problem Relation Age of Onset   Coronary artery disease Other        family hx   Breast cancer Other        1st egree relative <50   Cancer Mother    Heart disease Father     Social History Social History   Tobacco Use   Smoking status: Former Smoker    Packs/day: 0.50    Years: 45.00    Pack years: 22.50    Types: Cigarettes    Last attempt to quit: 01/25/2018    Years since quitting: 0.8   Smokeless tobacco: Never Used   Tobacco comment:  1 ppd +40 years  Substance Use Topics   Alcohol use: Not Currently    Alcohol/week: 0.0 standard drinks    Comment: weekly but last dose 1 month   Drug use: No    Review of Systems Constitutional: Fevers chills and fatigue eyes: No  visual changes. ENT: No sore throat. Cardiovascular: Denies chest pain. Respiratory: See HPI, short of breath but started feel better after EMS put him on added oxygen gastrointestinal: No abdominal pain.   Genitourinary: Negative for dysuria. Musculoskeletal: Negative for back pain. Skin: Negative for rash. Neurological: Negative for headaches, areas of focal weakness or numbness.    ____________________________________________   PHYSICAL EXAM:  VITAL SIGNS: ED Triage Vitals  Enc Vitals Group     BP 12/17/18 1211 (!) 104/46     Pulse Rate 12/17/18 1204 93     Resp 12/17/18 1211 (!) 25     Temp 12/17/18 1211 99.7 F (37.6 C)     Temp Source 12/17/18 1211 Oral     SpO2 12/17/18 1204 (!) 78 %     Weight 12/17/18 1211 185 lb (83.9 kg)     Height 12/17/18 1211 _0  (1.778 m)     Head Circumference --      Peak Flow --      Pain Score 12/17/18 1211 8     Pain Loc --      Pain Edu? --      Excl. in [place]? --     Constitutional: Alert and oriented. Well appearing and in no acute distress. Eyes: Conjunctivae are normal. Head: Atraumatic. Nose: No congestion/rhinnorhea. Mouth/Throat: Mucous membranes are moist. Neck: No stridor.  Cardiovascular: Normal rate, regular rhythm. Grossly normal heart sounds.  Good peripheral circulation. Respiratory: Normal respiratory effort.  No retractions. Lungs CTAB. Gastrointestinal: Soft and nontender. No distention. Musculoskeletal: No lower extremity tenderness nor edema. Neurologic:  Normal speech and language. No gross focal neurologic deficits are appreciated.  Skin:  Skin is warm, dry and intact. No rash noted. Psychiatric: Mood and affect are normal. Speech and behavior are  normal.  ____________________________________________   LABS (all labs ordered are listed, but only abnormal results are displayed)  Labs Reviewed  LACTIC ACID, PLASMA - Abnormal; Notable for the following components:      Result Value   Lactic Acid, Venous 2.7 (*)    All other components within normal limits  FIBRIN DERIVATIVES D-DIMER ([facility] ONLY) - Abnormal; Notable for the following components:   Fibrin derivatives D-dimer [facility]) 1,311.92 (*)    All other components within normal limits  FIBRINOGEN - Abnormal; Notable for the following components:   Fibrinogen 499 (*)    All other components within normal limits  SARS CORONAVIRUS 2 (HOSPITAL ORDER, [place])  CULTURE, BLOOD (ROUTINE X 2)  CULTURE, BLOOD (ROUTINE X 2)  PROCALCITONIN  PROTIME-INR  CBC WITH DIFFERENTIAL/PLATELET  URINALYSIS, ROUTINE W REFLEX MICROSCOPIC  C-REACTIVE PROTEIN  BLOOD GAS, VENOUS  CBC WITH DIFFERENTIAL/PLATELET  COMPREHENSIVE METABOLIC PANEL  FERRITIN  LACTATE DEHYDROGENASE  LIPASE, BLOOD  TRIGLYCERIDES   ____________________________________________  EKG   ____________________________________________  RADIOLOGY  Dg Chest Port 1 View  Result Date: 12/17/2018 CLINICAL DATA:  Worsening shortness of breath, cough, fever EXAM: PORTABLE CHEST 1 VIEW COMPARISON:  11/27/2018 FINDINGS: Previous median sternotomy. Normal heart size and vascularity. Similar diffuse chronic interstitial changes compatible with chronic lung disease. No effusion or pneumothorax. Trachea is midline. IMPRESSION: Stable chronic interstitial changes. No interval change or superimposed acute process. Electronically Signed   By: Jerilynn Mages.  Shick M.D.   On: 12/17/2018 12:45     ____________________________________________   PROCEDURES  Procedure(s) performed: None  Procedures  Critical Care performed: Yes, see critical care note(s)  CRITICAL CARE Performed by: Delman Kitten   Total critical  care time: 35 minutes  Critical care time was exclusive of separately billable procedures and treating other patients.  Critical care was necessary to treat or prevent imminent or life-threatening deterioration.  Critical care was time spent personally by me on the following activities: development of treatment plan with patient and/or surrogate as well as nursing, discussions with consultants, evaluation of patient's response to treatment, examination of patient, obtaining history from patient or surrogate, ordering and performing treatments and interventions, ordering and review of laboratory studies, ordering and review of radiographic studies, pulse oximetry and re-evaluation of patient's condition.  Patient presents with hypoxia saturation less than 80% on initial evaluation.  I did be placed on 6 L nasal cannula to maintain saturations in the low 90s.  He is obviously tachypneic, rhonchorous lung sounds, elevated risk for cardiorespiratory failure and also under acute treatment for concerns initially of possible sepsis. ____________________________________________   INITIAL IMPRESSION / ASSESSMENT AND PLAN / ED COURSE  Pertinent labs & imaging results that were available during my care of the patient were reviewed by me and considered in my medical decision making (see chart for details).         DURWIN DAVISSON was evaluated in Emergency Department on 12/17/2018 for the symptoms described in the history of present illness. He was evaluated in the context of the global COVID-19 pandemic, which necessitated consideration that the patient might be at risk for infection with the SARS-CoV-2 virus that causes COVID-19. Institutional protocols and algorithms that pertain to the evaluation of patients at risk for COVID-19 are in a state of rapid change based on information released by regulatory bodies including the CDC and federal and state organizations. These policies and algorithms were  followed during the patient's care in the ED.  ----------------------------------------- 1:37 PM on 12/17/2018 -----------------------------------------  Reassessment, patient remains approximately the same condition.  No decompensation.  Lactic acid is elevated I do not see obvious signs of heart failure, thus fluid bolus has been ordered.  Lactic acid is less than 4, no episodes of hypotension with blood pressure less than 90.  Patient meeting criteria for sepsis.  Patient agreeable understanding of plan for admission, pending COVID test prior to disposition decision.  ----------------------------------------- 2:59 PM on 12/17/2018 -----------------------------------------  I suspect recurrence of probable underlying pneumonia or possibly element of bronchiectasis or viral bronchitis.  He is feeling somewhat improved, remained saturation about low 90s on 6 L nasal cannula.  He is agreeable and understand plan for admission.  Dr. Coralie Keens will be admitting him.  Reassessment completed at this time  Vitals:   12/17/18 1302 12/17/18 1309  BP:  (!) 119/58  Pulse: 81 80  Resp: (!) 21 (!) 24  Temp:    SpO2: 90% 91%      ____________________________________________   FINAL CLINICAL IMPRESSION(S) / ED DIAGNOSES  Final diagnoses:  COPD exacerbation (HCC)  HCAP (healthcare-associated pneumonia)  Sepsis, due to unspecified organism, unspecified whether acute organ dysfunction present [facility])  Healthcare associated pneumonia      Note:  This document was prepared using Dragon voice recognition software and may include unintentional dictation errors       Delman Kitten, MD 12/17/18 1500

## 2018-12-17 NOTE — ED Notes (Signed)
ED TO INPATIENT HANDOFF REPORT  ED Nurse Name and Phone #: Garnette Greb [phone]  S Name/Age/Gender Tony Watson 68 y.o. male Room/Bed: ED06A/ED06A  Code Status   Code Status: Prior  Home/SNF/Other Home Patient oriented to: self, place, time and situation Is this baseline? Yes   Triage Complete: Triage complete  Chief Complaint Resp Distress  Triage Note Pt arrives [facility] from home. Seen about a month ago for pneumonia and sepsis. Fever 2-3 days with congested cough. States he finished antibiotics a few weeks ago. Has been at home. Temp 100.8 with EMS. HR in 90's. Wears 5 L at home, states he increased it to 7-8 L [place] the past few days. Able to talk in complete sentences but starts to clear throat or cough any time he talks. Extremely congested. C/o L hip pain that began today. Denies falling. Hx CHF.    Allergies Allergies  Allergen Reactions  . Prilosec [Omeprazole] Diarrhea    Level of Care/Admitting Diagnosis ED Disposition    ED Disposition Condition [place] [facility]: [place] [[id number]]  Level of Care: Med-Surg [16]  Covid Evaluation: N/A  Diagnosis: Sepsis [facility]) [[id number]]  Admitting Physician: Vaughan Basta [phone]  Attending Physician: Vaughan Basta [phone]  Estimated length of stay: past midnight tomorrow  Certification:: I certify this patient will need inpatient services for at least 2 midnights  PT Class (Do Not Modify): Inpatient [101]  PT Acc Code (Do Not Modify): Private [1]       B Medical/Surgery History Past Medical History:  Diagnosis Date  . Arthritis   . CAD (coronary artery disease)    a. inf MI 11/99 (in [place]) w/ PCI to [facility]; b. [place] 2004 [facility]): EF 50%, patent RCA stent, no obs dz; c. MV 2008: EF 42%, inf infarct, no ischemia; d. R/LHC 9/19: 3-v dz w/ mild dif dz LM, m-dLAD 50, dLAD 60, D2 80, RI 80, mRCA-1 90, mRCA-2 20, patent RCA stent, CO/CI 3.7/2  . Chronic combined systolic  (congestive) and diastolic (congestive) heart failure ([place])    a. 7/29019 Echo: EF 25-30%, sev diff HK, mild MR mod dil LA, mildly reduced RVSF, elevated CVP  . Chronic respiratory failure with hypoxia (HCC)    a. on 2L supplemental oxygen via [place] followed by pulmonology  . CML (chronic myelocytic leukemia) ([place])   . COPD (chronic obstructive pulmonary disease) ([place])   . Diabetes mellitus without complication ([place])   . Diabetic autonomic neuropathy associated with type 2 diabetes mellitus ([place]) 03/27/2015  . Diverticulosis 2002  . GERD (gastroesophageal reflux disease)   . HTN (hypertension)   . Hyperlipidemia   . ILD (interstitial lung disease) ([place])   . Ischemic cardiomyopathy   . Medical non-compliance   . Smoker   . Spinal stenosis   . TIA (transient ischemic attack)    a. s/p PFO closure 2004 (in [place])  . Ulcer    Past Surgical History:  Procedure Laterality Date  . BACK SURGERY     patient denies-just lumbar punctures  . BRONCHOSCOPY    . CARDIAC CATHETERIZATION  11/99   CI per [facility]  . CATARACT EXTRACTION W/PHACO Right 11/24/2016   Procedure: CATARACT EXTRACTION PHACO AND INTRAOCULAR LENS PLACEMENT (IOC);  Surgeon: Birder Robson, MD;  Location: [facility] ORS;  Service: Ophthalmology;  Laterality: Right;  [place] 1:04.3 AP% 22.3 CDE 14.35 Fluid pack lot # [id number]  . CATARACT EXTRACTION W/PHACO Left 12/29/2016   Procedure: CATARACT EXTRACTION PHACO AND INTRAOCULAR LENS PLACEMENT (  [place]);  Surgeon: Birder Robson, MD;  Location: [facility] ORS;  Service: Ophthalmology;  Laterality: Left;  [place] 00:52 AP% 18.5 CDE 9.67 Fluid pack lot # [id number]  . COLONOSCOPY    . COLONOSCOPY WITH PROPOFOL N/A 05/25/2017   Procedure: COLONOSCOPY WITH PROPOFOL;  Surgeon: Jonathon Bellows, MD;  Location: [facility] ENDOSCOPY;  Service: Gastroenterology;  Laterality: N/A;  . CORONARY ANGIOPLASTY     STENT  . CORONARY ARTERY BYPASS GRAFT     stent  . ESOPHAGOGASTRODUODENOSCOPY (EGD) WITH PROPOFOL N/A 05/25/2017    Procedure: ESOPHAGOGASTRODUODENOSCOPY (EGD) WITH PROPOFOL;  Surgeon: Jonathon Bellows, MD;  Location: [facility] ENDOSCOPY;  Service: Gastroenterology;  Laterality: N/A;  . EYE SURGERY    . FLEXIBLE BRONCHOSCOPY N/A 05/19/2017   Procedure: FLEXIBLE BRONCHOSCOPY;  Surgeon: Wilhelmina Mcardle, MD;  Location: [facility] ORS;  Service: Pulmonary;  Laterality: N/A;  . open heart surgery  2004   PFO repair  . RIGHT/LEFT HEART CATH AND CORONARY ANGIOGRAPHY N/A 05/03/2018   Procedure: RIGHT/LEFT HEART CATH AND CORONARY ANGIOGRAPHY;  Surgeon: Nelva Bush, MD;  Location: [place] CV LAB;  Service: Cardiovascular;  Laterality: N/A;  . UPPER GI ENDOSCOPY       A IV Location/Drains/Wounds Patient Lines/Drains/Airways Status   Active Line/Drains/Airways    Name:   Placement date:   Placement time:   Site:   Days:   Peripheral IV 12/17/18 Left Hand   12/17/18    1213    Hand   less than 1   Peripheral IV 12/17/18 Right Forearm   12/17/18    1214    Forearm   less than 1          Intake/Output Last 24 hours  Intake/Output Summary (Last 24 hours) at 12/17/2018 1706 Last data filed at 12/17/2018 1309 Gross per 24 hour  Intake 177 ml  Output -  Net 177 ml    Labs/Imaging Results for orders placed or performed during the hospital encounter of 12/17/18 (from the past 48 hour(s))  Blood gas, venous     Status: Abnormal (Preliminary result)   Collection Time: 12/17/18 12:17 PM  Result Value Ref Range   pH, Ven 7.32 7.250 - 7.430   pCO2, Ven 47 44.0 - 60.0 mmHg   pO2, Ven PENDING 32.0 - 45.0 mmHg   Bicarbonate 24.2 20.0 - 28.0 mmol/L   Acid-base deficit 2.2 (H) 0.0 - 2.0 mmol/L   O2 Saturation 51.1 %   Patient temperature 37.0    Collection site VENOUS    Sample type VENOUS     Comment: Performed at [facility], [address], [place], [place] [address]  SARS Coronavirus 2 [facility] order, Performed in [place] hospital lab)     Status: None   Collection Time: 12/17/18 12:22 PM  Result  Value Ref Range   SARS Coronavirus 2 NEGATIVE NEGATIVE    Comment: (NOTE) If result is NEGATIVE SARS-CoV-2 target nucleic acids are NOT DETECTED. The SARS-CoV-2 RNA is generally detectable in upper and lower  respiratory specimens during the acute phase of infection. The lowest  concentration of SARS-CoV-2 viral copies this assay can detect is 250  copies / mL. A negative result does not preclude SARS-CoV-2 infection  and should not be used as the sole basis for treatment or other  patient management decisions.  A negative result may occur with  improper specimen collection / handling, submission of specimen other  than nasopharyngeal swab, presence of viral mutation(s) within the  areas targeted by this assay, and  inadequate number of viral copies  (<250 copies / mL). A negative result must be combined with clinical  observations, patient history, and epidemiological information. If result is POSITIVE SARS-CoV-2 target nucleic acids are DETECTED. The SARS-CoV-2 RNA is generally detectable in upper and lower  respiratory specimens dur ing the acute phase of infection.  Positive  results are indicative of active infection with SARS-CoV-2.  Clinical  correlation with patient history and other diagnostic information is  necessary to determine patient infection status.  Positive results do  not rule out bacterial infection or co-infection with other viruses. If result is PRESUMPTIVE POSTIVE SARS-CoV-2 nucleic acids MAY BE PRESENT.   A presumptive positive result was obtained on the submitted specimen  and confirmed on repeat testing.  While 2019 novel coronavirus  (SARS-CoV-2) nucleic acids may be present in the submitted sample  additional confirmatory testing may be necessary for epidemiological  and / or clinical management purposes  to differentiate between  SARS-CoV-2 and other Sarbecovirus currently known to infect humans.  If clinically indicated additional testing with an  alternate test  methodology [id number]) is advised. The SARS-CoV-2 RNA is generally  detectable in upper and lower respiratory sp ecimens during the acute  phase of infection. The expected result is Negative. Fact Sheet for Patients:  StrictlyIdeas.no Fact Sheet for Healthcare Providers: BankingDealers.co.za This test is not yet approved or cleared by the [place] FDA and has been authorized for detection and/or diagnosis of SARS-CoV-2 by FDA under an Emergency Use Authorization (EUA).  This EUA will remain in effect (meaning this test can be used) for the duration of the COVID-19 declaration under Section 564(b)(1) of the Act, 21 U.S.C. section 360bbb-3(b)(1), unless the authorization is terminated or revoked sooner. Performed at [facility], [address] [place], [place] [address]   Lactic acid, plasma     Status: Abnormal   Collection Time: 12/17/18  2:15 PM  Result Value Ref Range   Lactic Acid, Venous 2.7 (HH) 0.5 - 1.9 mmol/L    Comment: CRITICAL RESULT CALLED TO, READ BACK BY AND VERIFIED WITH KATE BUMGARNER 12/17/18 @ [place] Performed at [facility], [address]  [place], [place] [address]   CBC with Differential/Platelet     Status: Abnormal   Collection Time: 12/17/18  2:20 PM  Result Value Ref Range   WBC 46.4 (H) 4.0 - 10.5 K/uL    Comment: WHITE COUNT CONFIRMED ON SMEAR   RBC 2.82 (L) 4.22 - 5.81 MIL/uL   Hemoglobin 9.6 (L) 13.0 - 17.0 g/dL   HCT 28.5 (L) 39.0 - 52.0 %   MCV 101.1 (H) 80.0 - 100.0 fL   MCH 34.0 26.0 - 34.0 pg   MCHC 33.7 30.0 - 36.0 g/dL   RDW 16.4 (H) 11.5 - 15.5 %   Platelets 80 (L) 150 - 400 K/uL    Comment: Immature Platelet Fraction may be clinically indicated, consider ordering this additional test [id number]    nRBC 0.0 0.0 - 0.2 %   Neutrophils Relative % 66 %   Neutro Abs 30.4 (H) 1.7 - 7.7 K/uL   Lymphocytes Relative 6 %   Lymphs Abs 2.9 0.7 - 4.0 K/uL    Monocytes Relative 20 %   Monocytes Absolute 9.3 (H) 0.1 - 1.0 K/uL   Eosinophils Relative 0 %   Eosinophils Absolute 0.0 0.0 - 0.5 K/uL   Basophils Relative 0 %   Basophils Absolute 0.2 (H) 0.0 - 0.1 K/uL   WBC Morphology  MODERATE LEFT SHIFT (>5% METAS AND MYELOS,OCC PRO NOTED)   RBC Morphology MORPHOLOGY UNREMARKABLE    Smear Review PLATELETS APPEAR DECREASED    Immature Granulocytes 8 %   Abs Immature Granulocytes 3.57 (H) 0.00 - 0.07 K/uL    Comment: Performed at [facility], [address] [place], [place] [address]  Comprehensive metabolic panel     Status: Abnormal   Collection Time: 12/17/18  2:20 PM  Result Value Ref Range   Sodium 129 (L) 135 - 145 mmol/L   Potassium 4.2 3.5 - 5.1 mmol/L   Chloride 90 (L) 98 - 111 mmol/L   CO2 29 22 - 32 mmol/L   Glucose, Bld 249 (H) 70 - 99 mg/dL   BUN 32 (H) 8 - 23 mg/dL   Creatinine, Ser 1.69 (H) 0.61 - 1.24 mg/dL   Calcium 7.7 (L) 8.9 - 10.3 mg/dL   Total Protein 7.0 6.5 - 8.1 g/dL   Albumin 3.1 (L) 3.5 - 5.0 g/dL   AST 34 15 - 41 U/L   ALT 47 (H) 0 - 44 U/L   Alkaline Phosphatase 46 38 - 126 U/L   Total Bilirubin 1.2 0.3 - 1.2 mg/dL   GFR calc non Af Amer 41 (L) >60 mL/min   GFR calc Af Amer 48 (L) >60 mL/min   Anion gap 10 5 - 15    Comment: Performed at [facility], [place], [place] [address]  Fibrin derivatives D-Dimer [facility] only)     Status: Abnormal   Collection Time: 12/17/18  2:20 PM  Result Value Ref Range   Fibrin derivatives D-dimer ([facility]) 1,311.92 (H) 0.00 - 499.00 ng/mL (FEU)    Comment: (NOTE) <> Exclusion of Venous Thromboembolism (VTE) - OUTPATIENT ONLY   (Emergency Department or [place])   0-499 ng/ml (FEU): With a low to intermediate pretest probability                      for VTE this test result excludes the diagnosis                      of VTE.   >499 ng/ml (FEU) : VTE not excluded; additional work up for VTE is                      required. <> Testing on  Inpatients and Evaluation of Disseminated Intravascular   Coagulation (DIC) Reference Range:   0-499 ng/ml (FEU) Performed at [facility], [address] [place], [place] [address]   Ferritin     Status: Abnormal   Collection Time: 12/17/18  2:20 PM  Result Value Ref Range   Ferritin 983 (H) 24 - 336 ng/mL    Comment: Performed at [facility], [address] [place], [place] [address]  Fibrinogen     Status: Abnormal   Collection Time: 12/17/18  2:20 PM  Result Value Ref Range   Fibrinogen 499 (H) 210 - 475 mg/dL    Comment: Performed at [facility], [address] [place], [place] [address]  Lactate dehydrogenase     Status: Abnormal   Collection Time: 12/17/18  2:20 PM  Result Value Ref Range   LDH 384 (H) 98 - 192 U/L    Comment: Performed at [facility], [address] [place], [place] [address]  Lipase, blood     Status: None   Collection Time: 12/17/18  2:20 PM  Result Value Ref Range   Lipase 23 11 - 51 U/L    Comment: Performed at [facility], [place], [place] [address]  Procalcitonin     Status: None   Collection Time: 12/17/18  2:20 PM  Result Value Ref Range   Procalcitonin 3.30 ng/mL    Comment:        Interpretation: PCT > 2 ng/mL: Systemic infection (sepsis) is likely, unless other causes are known. (NOTE)       Sepsis PCT Algorithm           Lower Respiratory Tract                                      Infection PCT Algorithm    ----------------------------     ----------------------------         PCT < 0.25 ng/mL                PCT < 0.10 ng/mL         Strongly encourage             Strongly discourage   discontinuation of antibiotics    initiation of antibiotics    ----------------------------     -----------------------------       PCT 0.25 - 0.50 ng/mL            PCT 0.10 - 0.25 ng/mL               OR       >80% decrease in PCT            Discourage initiation of                                             antibiotics      Encourage discontinuation           of antibiotics    ----------------------------     -----------------------------         PCT >= 0.50 ng/mL              PCT 0.26 - 0.50 ng/mL               AND       <80% decrease in PCT              Encourage initiation of                                             antibiotics       Encourage continuation           of antibiotics    ----------------------------     -----------------------------        PCT >= 0.50 ng/mL                  PCT > 0.50 ng/mL               AND         increase in PCT                  Strongly encourage  initiation of antibiotics    Strongly encourage escalation           of antibiotics                                     -----------------------------                                           PCT <= 0.25 ng/mL                                                 OR                                        > 80% decrease in PCT                                     Discontinue / Do not initiate                                             antibiotics Performed at [facility], [address] [place], [place] [address]   Protime-INR     Status: None   Collection Time: 12/17/18  2:20 PM  Result Value Ref Range   Prothrombin Time 14.5 11.4 - 15.2 seconds   INR 1.1 0.8 - 1.2    Comment: (NOTE) INR goal varies based on device and disease states. Performed at [facility], [address] [place], [place] [address]   Triglycerides     Status: None   Collection Time: 12/17/18  2:20 PM  Result Value Ref Range   Triglycerides 93 <150 mg/dL    Comment: Performed at [facility], [address] [place],  [address]   Dg Chest [place] 1 View  Result Date: 12/17/2018 CLINICAL DATA:  Worsening shortness of breath, cough, fever EXAM: PORTABLE CHEST 1 VIEW COMPARISON:  11/27/2018 FINDINGS: Previous median sternotomy. Normal  heart size and vascularity. Similar diffuse chronic interstitial changes compatible with chronic lung disease. No effusion or pneumothorax. Trachea is midline. IMPRESSION: Stable chronic interstitial changes. No interval change or superimposed acute process. Electronically Signed   By: Jerilynn Mages.  Shick M.D.   On: 12/17/2018 12:45    Pending Labs Unresulted Labs (From admission, onward)    Start     Ordered   12/17/18 1656  Vitamin B12  Add-on,   AD     12/17/18 1655   12/17/18 1653  Lactic acid, plasma  STAT Now then every 3 hours,   STAT     12/17/18 1652   12/17/18 1646  MRSA PCR Screening  Once,   STAT     12/17/18 1646   12/17/18 1420  Pathologist smear review  Once,   STAT     12/17/18 1420   12/17/18 1216  C-reactive protein  Once,   STAT     12/17/18  1216   12/17/18 1215  CBC WITH DIFFERENTIAL  ONCE - STAT,   STAT     12/17/18 1215   12/17/18 1215  Blood Culture (routine x 2)  BLOOD CULTURE X 2,   STAT     12/17/18 1215   12/17/18 1215  Urinalysis, Routine w reflex microscopic  ONCE - STAT,   STAT     12/17/18 1215   Signed and Held  Basic metabolic panel  Tomorrow morning,   R     Signed and Held   Signed and Held  CBC  Tomorrow morning,   R     Signed and Held   Signed and Held  CBC  (heparin)  Once,   R    Comments:  Baseline for heparin therapy IF NOT ALREADY DRAWN.  Notify MD if PLT < 100 K.    Signed and Held   Signed and Held  Creatinine, serum  (heparin)  Once,   R    Comments:  Baseline for heparin therapy IF NOT ALREADY DRAWN.    Signed and Held          Vitals/Pain Today's Vitals   12/17/18 1309 12/17/18 1330 12/17/18 1400 12/17/18 1500  BP: (!) 119/58 (!) 126/104 118/60 (!) 128/56  Pulse: 80 78 75 74  Resp: (!) 24 18 (!) 22 (!) 22  Temp:      TempSrc:      SpO2: 91% 93% 90% 90%  Weight:      Height:      PainSc:        Isolation Precautions No active isolations  Medications Medications  budesonide (PULMICORT) nebulizer solution 0.25 mg (has no  administration in time range)  methylPREDNISolone sodium succinate (SOLU-MEDROL) 125 mg/2 mL injection 60 mg (has no administration in time range)  tiotropium (SPIRIVA) inhalation capsule ([facility] use ONLY) 18 mcg (has no administration in time range)  ceFEPIme (MAXIPIME) 2 g in sodium chloride 0.9 % 100 mL IVPB (0 g Intravenous Stopped 12/17/18 1314)  methylPREDNISolone sodium succinate (SOLU-MEDROL) 125 mg/2 mL injection 125 mg (125 mg Intravenous Given 12/17/18 1244)  vancomycin (VANCOCIN) 2,000 mg in sodium chloride 0.9 % 500 mL IVPB (0 mg Intravenous Stopped 12/17/18 1450)  sodium chloride 0.9 % bolus 1,000 mL (1,000 mLs Intravenous New Bag/Given 12/17/18 1420)  ipratropium-albuterol (DUONEB) 0.5-2.5 (3) MG/3ML nebulizer solution 3 mL (3 mLs Nebulization Given 12/17/18 1512)  ipratropium-albuterol (DUONEB) 0.5-2.5 (3) MG/3ML nebulizer solution 3 mL (3 mLs Nebulization Given 12/17/18 1512)    Mobility walks with device Moderate fall risk   Focused Assessments Pulmonary Assessment Handoff:  Lung sounds: L Breath Sounds: Diminished, Rhonchi R Breath Sounds: Diminished, Rhonchi O2 Device: Nasal Cannula O2 Flow Rate (L/min): 6 L/min      R Recommendations: See Admitting Provider Note  Report given to:   Additional Notes: pt out of breath standing to urinate

## 2018-12-17 NOTE — Consult Note (Signed)
CODE SEPSIS - PHARMACY COMMUNICATION  **Broad Spectrum Antibiotics should be administered within 1 hour of Sepsis diagnosis**  Time Code Sepsis Called/Page Received: 1216  Antibiotics Ordered: vancomycin and cefepime  Time of 1st antibiotic administration: 1244  Additional action taken by pharmacy: none required  If necessary, Name of Provider/Nurse Contacted: N/A  Dallie Piles ,PharmD Clinical Pharmacist  12/17/2018  1:08 PM

## 2018-12-17 NOTE — ED Notes (Signed)
Date and time results received: 12/17/18 1330   Test: lactic acid Critical Value: 2.7  Name of Provider Notified: Dr. Jacqualine Code

## 2018-12-17 NOTE — Progress Notes (Addendum)
NP Tukov notified of sodium level of 129.

## 2018-12-17 NOTE — Progress Notes (Signed)
As per nursing incharge in ER and floor- 7 ltr oxygen is " too high " to send pt to floor, their Mue score will be high and suggest to admit pt to Step down. I spoke to Dr. Alva Garnet, updated him about this pt.

## 2018-12-17 NOTE — ED Notes (Signed)
Attempt to cal report-RN will call back

## 2018-12-17 NOTE — Progress Notes (Addendum)
NP Tukov notified of blood sugar of 423 and insulin ordered.

## 2018-12-17 NOTE — ED Notes (Signed)
1st set of blood cultures drawn from R AC at this time.

## 2018-12-17 NOTE — ED Notes (Signed)
PT provided sandwich tray and drink

## 2018-12-17 NOTE — Progress Notes (Signed)
Family Meeting Note  Advance Directive:yes  Today a meeting took place with the Patient.   The following clinical team members were present during this meeting:MD  The following were discussed:Patient's diagnosis: Acute on chronic respiratory failure, chronic systolic and diastolic congestive heart failure, COPD and home oxygen use, Patient's progosis: Unable to determine and Goals for treatment: Full Code  In the past patient was listed as DNR.  I discussed with him- he would like to have a trial of resuscitative measures and a short trial of ventilator use also if needed.  His wife is power of attorney.  Additional follow-up to be provided: PMD  Time spent during discussion:20 minutes  Vaughan Basta, MD

## 2018-12-17 NOTE — ED Provider Notes (Signed)
Ongoing care including ER care assigned to Dr. Archie Balboa as the patient is awaiting admission.  Treating for COPD exacerbation and concern for healthcare associated pneumonia at this time.  Hospitalist have seen and evaluated.   Delman Kitten, MD 12/17/18 1527

## 2018-12-17 NOTE — H&P (Signed)
[facility] [place] NAME: Tony Watson    MR#:  [id number]  DATE OF BIRTH:  1951/07/16  DATE OF ADMISSION:  12/17/2018  PRIMARY CARE PHYSICIAN: Owens Loffler, MD   REQUESTING/REFERRING PHYSICIAN: Qual[place]  CHIEF COMPLAINT:   Chief Complaint  Patient presents with  . Cough  . Shortness of Breath    HISTORY OF PRESENT ILLNESS: Tony Watson  is a 68 y.o. male with a known history of coronary artery disease, chronic combined systolic and diastolic congestive heart failure, chronic respiratory failure with hypoxia and requiring 4 to 5 L oxygen at home, chronic myeloid leukemia, COPD, diabetes, diverticulosis, hypertension, hyperlipidemia, past smoker-follows with Dr. Mortimer Fries in clinic-for last 2 to 3 days has worsening in shortness of breath with some cough and greenish sputum production.  His oxygen requirement also went up from 5L to 7 L at home. He had low-grade fever up to 101 F at home.  Concerned with this he came to emergency room.  He denies any chest pain. In the ER x-ray chest was negative for any acute infiltrate but patient white blood cell count was significantly high and he had signs of sepsis with increased respiratory rate with hypoxia and lactic acidosis.  His renal function is also worse.  Concerned with this ER physician started on broad-spectrum antibiotic and gave to hospitalist team probable admission.  He is COVID-19 is negative for now.  PAST MEDICAL HISTORY:   Past Medical History:  Diagnosis Date  . Arthritis   . CAD (coronary artery disease)    a. inf MI 11/99 (in [place]) w/ PCI to [facility]; b. [place] 2004 [facility]): EF 50%, patent RCA stent, no obs dz; c. MV 2008: EF 42%, inf infarct, no ischemia; d. R/[facility]HC 9/19: 3-v dz w/ mild dif dz LM, m-dLAD 50, dLAD 60, D2 80, RI 80, mRCA-1 90, mRCA-2 20, patent RCA stent, CO/CI 3.7/2  . Chronic combined systolic (congestive) and diastolic (congestive) heart failure ([place])    a. 7/29019 Echo: EF  25-30%, sev diff HK, mild MR mod dil LA, mildly reduced RVSF, elevated CVP  . Chronic respiratory failure with hypoxia (HCC)    a. on 2L supplemental oxygen via [place] followed by pulmonology  . CML (chronic myelocytic leukemia) ([place])   . COPD (chronic obstructive pulmonary disease) ([place])   . Diabetes mellitus without complication ([place])   . Diabetic autonomic neuropathy associated with type 2 diabetes mellitus ([place]) 03/27/2015  . Diverticulosis 2002  . GERD (gastroesophageal reflux disease)   . HTN (hypertension)   . Hyperlipidemia   . ILD (interstitial lung disease) ([place])   . Ischemic cardiomyopathy   . Medical non-compliance   . Smoker   . Spinal stenosis   . TIA (transient ischemic attack)    a. s/p PFO closure 2004 (in [place])  . Ulcer     PAST SURGICAL HISTORY:  Past Surgical History:  Procedure Laterality Date  . BACK SURGERY     patient denies-just lumbar punctures  . BRONCHOSCOPY    . CARDIAC CATHETERIZATION  11/99   CI per [facility]  . CATARACT EXTRACTION W/PHACO Right 11/24/2016   Procedure: CATARACT EXTRACTION PHACO AND INTRAOCULAR LENS PLACEMENT (IOC);  Surgeon: Birder Robson, MD;  Location: [facility] ORS;  Service: Ophthalmology;  Laterality: Right;  [place] 1:04.3 AP% 22.3 CDE 14.35 Fluid pack lot # [id number]  . CATARACT EXTRACTION W/PHACO Left 12/29/2016   Procedure: CATARACT EXTRACTION PHACO AND INTRAOCULAR LENS PLACEMENT (IOC);  Surgeon: Birder Robson, MD;  Location: [facility] ORS;  Service: Ophthalmology;  Laterality: Left;  [place] 00:52 AP% 18.5 CDE 9.67 Fluid pack lot # [id number]  . COLONOSCOPY    . COLONOSCOPY WITH PROPOFOL N/A 05/25/2017   Procedure: COLONOSCOPY WITH PROPOFOL;  Surgeon: Jonathon Bellows, MD;  Location: [facility] ENDOSCOPY;  Service: Gastroenterology;  Laterality: N/A;  . CORONARY ANGIOPLASTY     STENT  . CORONARY ARTERY BYPASS GRAFT     stent  . ESOPHAGOGASTRODUODENOSCOPY (EGD) WITH PROPOFOL N/A 05/25/2017   Procedure: ESOPHAGOGASTRODUODENOSCOPY (EGD) WITH PROPOFOL;   Surgeon: Jonathon Bellows, MD;  Location: [facility] ENDOSCOPY;  Service: Gastroenterology;  Laterality: N/A;  . EYE SURGERY    . FLEXIBLE BRONCHOSCOPY N/A 05/19/2017   Procedure: FLEXIBLE BRONCHOSCOPY;  Surgeon: Wilhelmina Mcardle, MD;  Location: [facility] ORS;  Service: Pulmonary;  Laterality: N/A;  . open heart surgery  2004   PFO repair  . RIGHT/LEFT HEART CATH AND CORONARY ANGIOGRAPHY N/A 05/03/2018   Procedure: RIGHT/LEFT HEART CATH AND CORONARY ANGIOGRAPHY;  Surgeon: Nelva Bush, MD;  Location: [place] CV LAB;  Service: Cardiovascular;  Laterality: N/A;  . UPPER GI ENDOSCOPY      SOCIAL HISTORY:  Social History   Tobacco Use  . Smoking status: Former Smoker    Packs/day: 0.50    Years: 45.00    Pack years: 22.50    Types: Cigarettes    Last attempt to quit: 01/25/2018    Years since quitting: 0.8  . Smokeless tobacco: Never Used  . Tobacco comment: 1 ppd +40 years  Substance Use Topics  . Alcohol use: Not Currently    Alcohol/week: 0.0 standard drinks    Comment: weekly but last dose 1 month    FAMILY HISTORY:  Family History  Problem Relation Age of Onset  . Coronary artery disease Other        family hx  . Breast cancer Other        1st egree relative <50  . Cancer Mother   . Heart disease Father     DRUG ALLERGIES:  Allergies  Allergen Reactions  . Prilosec [Omeprazole] Diarrhea    REVIEW OF SYSTEMS:   CONSTITUTIONAL: Have fever, fatigue or weakness.  EYES: No blurred or double vision.  EARS, NOSE, AND THROAT: No tinnitus or ear pain.  RESPIRATORY: Have cough, shortness of breath, no wheezing or hemoptysis.  CARDIOVASCULAR: No chest pain, orthopnea, edema.  GASTROINTESTINAL: No nausea, vomiting, diarrhea or abdominal pain.  GENITOURINARY: No dysuria, hematuria.  ENDOCRINE: No polyuria, nocturia,  HEMATOLOGY: No anemia, easy bruising or bleeding SKIN: No rash or lesion. MUSCULOSKELETAL: No joint pain or arthritis.   NEUROLOGIC: No tingling, numbness,  weakness.  PSYCHIATRY: No anxiety or depression.   MEDICATIONS AT HOME:  Prior to Admission medications   Medication Sig Start Date End Date Taking? Authorizing Provider  acetaminophen (TYLENOL) 500 MG tablet Take 1,000 mg by mouth daily as needed for moderate pain or headache.   Yes [provider]  aspirin 81 MG chewable tablet Chew 1 tablet (81 mg total) by mouth daily. 05/09/18  Yes Wieting, Richard, MD  atorvastatin (LIPITOR) 40 MG tablet TAKE 1 TABLET (40 MG TOTAL) BY MOUTH DAILY AT 6 PM. 12/01/18  Yes Gollan, Kathlene November, MD  budesonide (PULMICORT) 0.5 MG/2ML nebulizer solution Take 2 mLs (0.5 mg total) by nebulization 2 (two) times daily. 03/06/18 03/06/19 Yes Vaughan Basta, MD  carvedilol (COREG) 12.5 MG tablet Take 1 tablet (12.5 mg total) by mouth 2 (two) times daily. 11/14/18 02/12/19 Yes Gollan, Kathlene November,  MD  cetirizine (ZYRTEC) 10 MG chewable tablet Chew 10 mg by mouth daily.   Yes [provider]  Cholecalciferol (VITAMIN D) 2000 units CAPS Take 2,000 Units by mouth every evening.   Yes [provider]  diltiazem (CARDIZEM CD) 120 MG 24 hr capsule Take 1 capsule (120 mg total) by mouth daily. 11/28/18  Yes Dustin Flock, MD  formoterol (PERFOROMIST) 20 MCG/2ML nebulizer solution Take 2 mLs (20 mcg total) by nebulization 2 (two) times daily. J44.9 06/10/18  Yes [name], MD  gabapentin (NEURONTIN) 300 MG capsule Take 1 capsule (300 mg total) by mouth at bedtime. 11/03/18  Yes Copland, Frederico Hamman, MD  ipratropium-albuterol (DUONEB) 0.5-2.5 (3) MG/3ML SOLN Take 3 mLs by nebulization every 4 (four) hours as needed. DX:J44.9 Patient taking differently: Take 3 mLs by nebulization every 4 (four) hours as needed (for shortness of breath/wheezing).  11/04/18  Yes [name], MD  ivabradine (CORLANOR) 5 MG TABS tablet Take 1 tablet (5 mg total) by mouth 2 (two) times daily with a meal. 08/08/18  Yes Hackney, Tina A, FNP  LORazepam (ATIVAN) 0.5 MG tablet Take 1  tablet (0.5 mg total) by mouth 3 (three) times daily as needed for anxiety. 11/11/18  Yes Copland, Frederico Hamman, MD  losartan (COZAAR) 25 MG tablet Take 0.5 tablets (12.5 mg total) by mouth daily. 11/14/18  Yes Minna Merritts, MD  Multiple Vitamin (MULTIVITAMIN WITH MINERALS) TABS tablet Take 1 tablet by mouth daily. 03/07/18  Yes Vaughan Basta, MD  omeprazole (PRILOSEC) 20 MG capsule Take 20 mg by mouth daily.   Yes [provider]  predniSONE (DELTASONE) 10 MG tablet Take 1 tablet (10 mg total) by mouth daily with breakfast. 11/11/18  Yes Copland, Frederico Hamman, MD  spironolactone (ALDACTONE) 25 MG tablet TAKE 1 TABLET BY MOUTH EVERY DAY 12/01/18  Yes Gollan, Kathlene November, MD  torsemide (DEMADEX) 20 MG tablet Take 3 tablets (60 mg total) by mouth daily. 10/03/18 01/01/19 Yes Hackney, Otila Kluver A, FNP  vitamin C (ASCORBIC ACID) 500 MG tablet Take 500 mg by mouth daily.   Yes [provider]  benzonatate (TESSALON) 200 MG capsule Take 1 capsule (200 mg total) by mouth 3 (three) times daily. 11/28/18   Dustin Flock, MD  blood glucose meter kit and supplies Dispense based on patient and insurance preference. Use to check blood sugar daily. 11/16/18   Copland, Frederico Hamman, MD  guaiFENesin-codeine 100-10 MG/5ML syrup Take 10 mLs by mouth every 4 (four) hours as needed for cough. 11/28/18   Dustin Flock, MD  nystatin (MYCOSTATIN) 100000 UNIT/ML suspension TAKE AS DIRECTED 5 MLS (500,000 UNITS TOTAL) IN THE MOUTH OR THROAT 4 TIMES DAILY Patient taking differently: Use as directed 5 mLs in the mouth or throat 4 (four) times daily.  10/12/18   Copland, Frederico Hamman, MD  ondansetron (ZOFRAN ODT) 4 MG disintegrating tablet Take 1-2 tablets (4-8 mg total) by mouth every 6 (six) hours as needed for nausea or vomiting. 11/16/18   Copland, Frederico Hamman, MD  predniSONE (STERAPRED UNI-PAK 21 TAB) 10 MG (21) TBPK tablet Start at 53m taper by 118muntil complete Patient not taking: Reported on 12/17/2018 11/28/18   PaDustin FlockMD   tiZANidine (ZANAFLEX) 4 MG tablet Take 1 tablet (4 mg total) by mouth Nightly. Patient not taking: Reported on 12/17/2018 03/23/18   Copland, SpFrederico HammanMD      PHYSICAL EXAMINATION:   VITAL SIGNS: Blood pressure (!) 128/56, pulse 74, temperature 99.7 F (37.6 C), temperature source Oral, resp. rate (!) 22, height  _0  (1.778 m), weight 83.9 kg, SpO2 90 %.  GENERAL:  68 y.o.-year-old patient lying in the bed with no acute distress.  EYES: Pupils equal, round, reactive to light and accommodation. No scleral icterus. Extraocular muscles intact.  HEENT: Head atraumatic, normocephalic. Oropharynx and nasopharynx clear.  NECK:  Supple, no jugular venous distention. No thyroid enlargement, no tenderness.  LUNGS: Normal breath sounds bilaterally, some wheezing, some crepitation. No use of accessory muscles of respiration.  On nasal cannula oxygen at 7 L. CARDIOVASCULAR: S1, S2 normal. No murmurs, rubs, or gallops.  ABDOMEN: Soft, nontender, nondistended. Bowel sounds present. No organomegaly or mass.  EXTREMITIES: Some bilateral pedal edema, no cyanosis, or clubbing.  NEUROLOGIC: Cranial nerves II through XII are intact. Muscle strength 5/5 in all extremities. Sensation intact. Gait not checked.  PSYCHIATRIC: The patient is alert and oriented x 3.  SKIN: No obvious rash, lesion, or ulcer.   LABORATORY PANEL:   CBC Recent Labs  Lab 12/17/18 1420  WBC 46.4*  HGB 9.6*  HCT 28.5*  PLT 80*  MCV 101.1*  MCH 34.0  MCHC 33.7  RDW 16.4*  LYMPHSABS 2.9  MONOABS 9.3*  EOSABS 0.0  BASOSABS 0.2*   ------------------------------------------------------------------------------------------------------------------  Chemistries  Recent Labs  Lab 12/17/18 1420  NA 129*  K 4.2  CL 90*  CO2 29  GLUCOSE 249*  BUN 32*  CREATININE 1.69*  CALCIUM 7.7*  AST 34  ALT 47*  ALKPHOS 46  BILITOT 1.2    ------------------------------------------------------------------------------------------------------------------ estimated creatinine clearance is 44.4 mL/min (A) (by C-G formula based on SCr of 1.69 mg/dL (H)). ------------------------------------------------------------------------------------------------------------------ No results for input(s): TSH, T4TOTAL, T3FREE, THYROIDAB in the last 72 hours.  Invalid input(s): FREET3   Coagulation profile Recent Labs  Lab 12/17/18 1420  INR 1.1   ------------------------------------------------------------------------------------------------------------------- No results for input(s): DDIMER in the last 72 hours. -------------------------------------------------------------------------------------------------------------------  Cardiac Enzymes No results for input(s): CKMB, TROPONINI, MYOGLOBIN in the last 168 hours.  Invalid input(s): CK ------------------------------------------------------------------------------------------------------------------ Invalid input(s): POCBNP  ---------------------------------------------------------------------------------------------------------------  Urinalysis    Component Value Date/Time   COLORURINE YELLOW (A) 11/23/2018 1047   APPEARANCEUR CLEAR (A) 11/23/2018 1047   APPEARANCEUR Clear 02/05/2013 1230   LABSPEC 1.020 11/23/2018 1047   LABSPEC 1.026 02/05/2013 1230   PHURINE 5.0 11/23/2018 1047   GLUCOSEU 50 (A) 11/23/2018 1047   GLUCOSEU Negative 02/05/2013 1230   HGBUR SMALL (A) 11/23/2018 1047   BILIRUBINUR NEGATIVE 11/23/2018 1047   BILIRUBINUR Negative 02/05/2013 1230   KETONESUR NEGATIVE 11/23/2018 1047   PROTEINUR NEGATIVE 11/23/2018 1047   NITRITE NEGATIVE 11/23/2018 1047   LEUKOCYTESUR NEGATIVE 11/23/2018 1047   LEUKOCYTESUR Negative 02/05/2013 1230     RADIOLOGY: Dg Chest Port 1 View  Result Date: 12/17/2018 CLINICAL DATA:  Worsening shortness of breath, cough,  fever EXAM: PORTABLE CHEST 1 VIEW COMPARISON:  11/27/2018 FINDINGS: Previous median sternotomy. Normal heart size and vascularity. Similar diffuse chronic interstitial changes compatible with chronic lung disease. No effusion or pneumothorax. Trachea is midline. IMPRESSION: Stable chronic interstitial changes. No interval change or superimposed acute process. Electronically Signed   By: Jerilynn Mages.  Shick M.D.   On: 12/17/2018 12:45    EKG: Orders placed or performed during the hospital encounter of 12/17/18  . ED EKG 12-Lead  . ED EKG 12-Lead    IMPRESSION AND PLAN:  *Acute on chronic respiratory failure Patient is on increased oxygen over here. Secondary to acute bronchitis  He was admitted in hospital so would give broad-spectrum antibiotics for now. Chest x-ray is not  suggestive of pneumonia.  His COVID-19 test is negative. We will continue broad-spectrum antibiotics and give steroids and nebs treatment for his COPD. We may have to call pulmonology consult if he does not improve soon. He is on 5 L oxygen at baseline at home.  Currently requiring 7 L so try to taper.  *Acute bronchitis and COPD exacerbation IV and inhaled steroid, nebulizer treatment, antibiotic as mentioned above.  *Acute renal failure Due to worsening in renal function and sepsis, I would hold losartan, torsemide and Spironolactone Monitor renal function.  *Chronic diastolic and systolic congestive heart failure Ejection fraction is 30% as per recent echocardiogram I would try to avoid IV fluids at this time.  *Hyponatremia Likely due to diuretics use, hold spironolactone and torsemide for now.  *Macrocytic anemia We will get vitamin B12 level.  All the records are reviewed and case discussed with ED provider. Management plans discussed with the patient, family and they are in agreement.  CODE STATUS: Full code. Code Status History    Date Active Date Inactive Code Status Order ID Comments User Context    11/23/2018 1540 11/28/2018 1641 DNR [id number]  Saundra Shelling, MD Inpatient   05/03/2018 1107 05/08/2018 1718 DNR [id number]  Nelva Bush, MD Inpatient   05/03/2018 0955 05/03/2018 1107 Full Code [id number]  End, Harrell Gave, MD Inpatient   05/02/2018 1609 05/03/2018 0955 DNR [id number]  Jimmy Footman, NP Inpatient   05/02/2018 1422 05/02/2018 1609 Full Code [id number]  Jimmy Footman, NP Inpatient   05/02/2018 1303 05/02/2018 1422 DNR [id number]  Jimmy Footman, NP Inpatient   05/01/2018 1254 05/02/2018 1303 Full Code [id number]  Nicholes Mango, MD Inpatient   03/24/2018 1948 03/28/2018 1340 Full Code [id number]  Saundra Shelling, MD Inpatient   03/03/2018 0328 03/06/2018 1716 Full Code [id number]  Harrie Foreman, MD Inpatient    Questions for Most Recent Historical Code Status (Order [id number])    Question Answer Comment   In the event of cardiac or respiratory ARREST Do not call a "code blue"    In the event of cardiac or respiratory ARREST Do not perform Intubation, CPR, defibrillation or ACLS    In the event of cardiac or respiratory ARREST Use medication by any route, position, wound care, and other measures to relive pain and suffering. May use oxygen, suction and manual treatment of airway obstruction as needed for comfort.         Advance Directive Documentation     Most Recent Value  Type of Advance Directive  Healthcare Power of Attorney, Living will  Pre-existing out of facility DNR order (yellow form or pink MOST form)  -  "MOST" Form in Place?  -       TOTAL TIME TAKING CARE OF THIS PATIENT: 50 minutes.    Vaughan Basta M.D on 12/17/2018   Between 7am to 6pm - Pager - [phone]  After 6pm go to [email or web address] - password [email or web address] [place] [facility]  Office  [phone]  CC: Primary care physician; Owens Loffler, MD   Note: This dictation was prepared with Dragon dictation along with smaller phrase technology. Any  transcriptional errors that result from this process are unintentional.

## 2018-12-17 NOTE — ED Notes (Signed)
X-ray at bedside

## 2018-12-17 NOTE — ED Notes (Signed)
Recollect of light green, lavender, and blue sent to lab.

## 2018-12-17 NOTE — ED Notes (Signed)
When moving pt from EMS stretcher to ED stretcher pt was removed from oxygen. Oxygen levels decreased to upper 70's. When placed back on [place] at 4 L pt oxygen levels increased to 94%.

## 2018-12-17 NOTE — ED Notes (Signed)
2nd set of blood cultures drawn from L FA at this time.

## 2018-12-17 NOTE — ED Triage Notes (Signed)
Pt arrives [facility] from home. Seen about a month ago for pneumonia and sepsis. Fever 2-3 days with congested cough. States he finished antibiotics a few weeks ago. Has been at home. Temp 100.8 with EMS. HR in 90's. Wears 5 L at home, states he increased it to 7-8 L [place] the past few days. Able to talk in complete sentences but starts to clear throat or cough any time he talks. Extremely congested. C/o L hip pain that began today. Denies falling. Hx CHF.

## 2018-12-17 NOTE — ED Notes (Signed)
EMS gave 500 ml of NS 

## 2018-12-17 NOTE — ED Notes (Signed)
Pt oxygen increased from 4 L to 6 L [place]. Oxygen at 90%, will continue to monitor.

## 2018-12-17 NOTE — ED Notes (Signed)
Pt used

## 2018-12-18 LAB — CBC
HCT: 25.9 % — ABNORMAL LOW (ref 39.0–52.0)
Hemoglobin: 8.7 g/dL — ABNORMAL LOW (ref 13.0–17.0)
MCH: 34.3 pg — ABNORMAL HIGH (ref 26.0–34.0)
MCHC: 33.6 g/dL (ref 30.0–36.0)
MCV: 102 fL — ABNORMAL HIGH (ref 80.0–100.0)
Platelets: 65 10*3/uL — ABNORMAL LOW (ref 150–400)
RBC: 2.54 MIL/uL — ABNORMAL LOW (ref 4.22–5.81)
RDW: 16.2 % — ABNORMAL HIGH (ref 11.5–15.5)
WBC: 44.6 10*3/uL — ABNORMAL HIGH (ref 4.0–10.5)
nRBC: 0 % (ref 0.0–0.2)

## 2018-12-18 LAB — BASIC METABOLIC PANEL
Anion gap: 12 (ref 5–15)
BUN: 37 mg/dL — ABNORMAL HIGH (ref 8–23)
CO2: 25 mmol/L (ref 22–32)
Calcium: 7.8 mg/dL — ABNORMAL LOW (ref 8.9–10.3)
Chloride: 94 mmol/L — ABNORMAL LOW (ref 98–111)
Creatinine, Ser: 1.21 mg/dL (ref 0.61–1.24)
GFR calc Af Amer: >60 mL/min (ref >60)
GFR calc non Af Amer: >60 mL/min (ref >60)
Glucose, Bld: 338 mg/dL — ABNORMAL HIGH (ref 70–99)
Potassium: 3.8 mmol/L (ref 3.5–5.1)
Sodium: 131 mmol/L — ABNORMAL LOW (ref 135–145)

## 2018-12-18 LAB — GLUCOSE, CAPILLARY
Glucose-Capillary: 378 mg/dL — ABNORMAL HIGH (ref 70–99)
Glucose-Capillary: 340 mg/dL — ABNORMAL HIGH (ref 70–99)
Glucose-Capillary: 286 mg/dL — ABNORMAL HIGH (ref 70–99)
Glucose-Capillary: 305 mg/dL — ABNORMAL HIGH (ref 70–99)
Glucose-Capillary: 310 mg/dL — ABNORMAL HIGH (ref 70–99)
Glucose-Capillary: 294 mg/dL — ABNORMAL HIGH (ref 70–99)

## 2018-12-18 LAB — BRAIN NATRIURETIC PEPTIDE: B Natriuretic Peptide: 361 pg/mL — ABNORMAL HIGH (ref 0.0–100.0)

## 2018-12-18 LAB — C-REACTIVE PROTEIN: CRP: 18.2 mg/dL — ABNORMAL HIGH (ref <1.0)

## 2018-12-18 LAB — PHOSPHORUS: Phosphorus: 3.2 mg/dL (ref 2.5–4.6)

## 2018-12-18 LAB — VITAMIN B12: Vitamin B-12: 761 pg/mL (ref 180–914)

## 2018-12-18 LAB — MAGNESIUM: Magnesium: 1.4 mg/dL — ABNORMAL LOW (ref 1.7–2.4)

## 2018-12-18 MED ORDER — GABAPENTIN 300 MG PO CAPS: 300.0000 mg | ORAL_CAPSULE | Freq: Once | ORAL | Status: DC

## 2018-12-18 MED ORDER — BENZONATATE 100 MG PO CAPS
200.0000 mg | ORAL_CAPSULE | ORAL | Status: DC | PRN
  Administered 2018-12-20 (×4): 200 mg via ORAL
  Filled 2018-12-18 (×4): qty 2

## 2018-12-18 MED ORDER — ARFORMOTEROL TARTRATE 15 MCG/2ML IN NEBU
15.0000 ug | INHALATION_SOLUTION | Freq: Two times a day (BID) | RESPIRATORY_TRACT | Status: DC
  Administered 2018-12-18 – 2018-12-21 (×6): 15 ug via RESPIRATORY_TRACT
  Filled 2018-12-18 (×8): qty 2

## 2018-12-18 MED ORDER — NYSTATIN 100000 UNIT/ML MT SUSP
5.0000 mL | Freq: Three times a day (TID) | OROMUCOSAL | Status: DC
  Administered 2018-12-19 – 2018-12-21 (×7): 500000 [IU] via ORAL
  Filled 2018-12-18 (×7): qty 5

## 2018-12-18 MED ORDER — INSULIN ASPART 100 UNIT/ML ~~LOC~~ SOLN
0.0000 [IU] | Freq: Every day | SUBCUTANEOUS | Status: DC
  Administered 2018-12-18: 3 [IU] via SUBCUTANEOUS
  Administered 2018-12-19: 4 [IU] via SUBCUTANEOUS
  Administered 2018-12-20: 3 [IU] via SUBCUTANEOUS
  Filled 2018-12-18 (×3): qty 1

## 2018-12-18 MED ORDER — SODIUM CHLORIDE 0.9 % IV BOLUS
1000.0000 mL | Freq: Once | INTRAVENOUS | Status: AC
  Administered 2018-12-18: 1000 mL via INTRAVENOUS

## 2018-12-18 MED ORDER — TORSEMIDE 20 MG PO TABS
40.0000 mg | ORAL_TABLET | Freq: Every day | ORAL | Status: DC
  Administered 2018-12-18 – 2018-12-21 (×4): 40 mg via ORAL
  Filled 2018-12-18 (×4): qty 2

## 2018-12-18 MED ORDER — SPIRONOLACTONE 25 MG PO TABS
25.0000 mg | ORAL_TABLET | Freq: Every day | ORAL | Status: DC
  Administered 2018-12-18 – 2018-12-21 (×4): 25 mg via ORAL
  Filled 2018-12-18 (×4): qty 1

## 2018-12-18 MED ORDER — BUDESONIDE 0.5 MG/2ML IN SUSP: 0.5000 mg | Freq: Two times a day (BID) | RESPIRATORY_TRACT | Status: DC

## 2018-12-18 MED ORDER — BUDESONIDE 0.5 MG/2ML IN SUSP
0.5000 mg | Freq: Two times a day (BID) | RESPIRATORY_TRACT | Status: DC
  Administered 2018-12-18 – 2018-12-21 (×6): 0.5 mg via RESPIRATORY_TRACT
  Filled 2018-12-18 (×5): qty 2

## 2018-12-18 MED ORDER — PREDNISONE 20 MG PO TABS
40.0000 mg | ORAL_TABLET | Freq: Every day | ORAL | Status: DC
  Administered 2018-12-19 – 2018-12-20 (×2): 40 mg via ORAL
  Filled 2018-12-18 (×2): qty 2

## 2018-12-18 MED ORDER — METHYLPREDNISOLONE SODIUM SUCC 40 MG IJ SOLR
40.0000 mg | Freq: Two times a day (BID) | INTRAMUSCULAR | Status: AC
  Administered 2018-12-18: 40 mg via INTRAVENOUS
  Filled 2018-12-18: qty 1

## 2018-12-18 MED ORDER — SODIUM CHLORIDE 0.9 % IV SOLN
2.0000 g | Freq: Three times a day (TID) | INTRAVENOUS | Status: DC
  Administered 2018-12-18 – 2018-12-21 (×9): 2 g via INTRAVENOUS
  Filled 2018-12-18 (×11): qty 2

## 2018-12-18 MED ORDER — LACTATED RINGERS IV SOLN
INTRAVENOUS | Status: DC
  Administered 2018-12-18: 06:00:00 via INTRAVENOUS

## 2018-12-18 MED ORDER — NYSTATIN 500000 UNITS PO TABS
500000.0000 [IU] | ORAL_TABLET | Freq: Three times a day (TID) | ORAL | Status: DC
  Filled 2018-12-18: qty 1

## 2018-12-18 MED ORDER — INSULIN GLARGINE 100 UNIT/ML ~~LOC~~ SOLN
15.0000 [IU] | Freq: Every day | SUBCUTANEOUS | Status: DC
  Administered 2018-12-18 – 2018-12-20 (×3): 15 [IU] via SUBCUTANEOUS
  Filled 2018-12-18 (×3): qty 0.15

## 2018-12-18 MED ORDER — INSULIN ASPART 100 UNIT/ML ~~LOC~~ SOLN
0.0000 [IU] | Freq: Three times a day (TID) | SUBCUTANEOUS | Status: DC
  Administered 2018-12-18 – 2018-12-19 (×3): 11 [IU] via SUBCUTANEOUS
  Administered 2018-12-19 (×2): 15 [IU] via SUBCUTANEOUS
  Administered 2018-12-20: 11 [IU] via SUBCUTANEOUS
  Administered 2018-12-20 (×2): 8 [IU] via SUBCUTANEOUS
  Administered 2018-12-21: 5 [IU] via SUBCUTANEOUS
  Filled 2018-12-18 (×9): qty 1

## 2018-12-18 NOTE — Consult Note (Signed)
PULMONARY / CRITICAL CARE MEDICINE  Name: Tony Watson MRN: [id number] DOB: 12-25-1950    LOS: 1  Referring Provider:  Dr Anselm Jungling Reason for Referral:  Acute respiratory failure  HPI: 68 year old male with O2 dependent COPD, systolic and diastolic heart failure, type 2 diabetes, and chronic myeloid leukemia who presented to the ED with fever, cough and shortness of breath.  States that cough is productive of green sputum.  His ED work-up revealed leukocytosis with a WBC of 46.4K, hemoglobin of 9.6, platelets of 80, lactic acid level of 2.3 and severe hypoxia on VBG.  His COVID screen was negative.  He was ruled in for sepsis of unknown source and acute hypoxic respiratory failure secondary to sepsis.  He was supposed to be admitted to the floor but floor staff indicated that he was requiring a high percentage of FiO2 and they cannot handle.  He is being admitted to the ICU for further  Management. He is being followed at the pulmonary clinic by Dr. Mortimer Fries.  Usually he uses 4 to 5 L of oxygen at home.  Past Medical History:  Diagnosis Date  . Arthritis   . CAD (coronary artery disease)    a. inf MI 11/99 (in [place]) w/ PCI to [facility]; b. [place] 2004 [facility]): EF 50%, patent RCA stent, no obs dz; c. MV 2008: EF 42%, inf infarct, no ischemia; d. R/LHC 9/19: 3-v dz w/ mild dif dz LM, m-dLAD 50, dLAD 60, D2 80, RI 80, mRCA-1 90, mRCA-2 20, patent RCA stent, CO/CI 3.7/2  . Chronic combined systolic (congestive) and diastolic (congestive) heart failure ([place])    a. 7/29019 Echo: EF 25-30%, sev diff HK, mild MR mod dil LA, mildly reduced RVSF, elevated CVP  . Chronic respiratory failure with hypoxia (HCC)    a. on 2L supplemental oxygen via [place] followed by pulmonology  . CML (chronic myelocytic leukemia) ([place])   . COPD (chronic obstructive pulmonary disease) ([place])   . Diabetes mellitus without complication ([place])   . Diabetic autonomic neuropathy associated with type 2 diabetes mellitus ([place]) 03/27/2015  .  Diverticulosis 2002  . GERD (gastroesophageal reflux disease)   . HTN (hypertension)   . Hyperlipidemia   . ILD (interstitial lung disease) ([place])   . Ischemic cardiomyopathy   . Medical non-compliance   . Smoker   . Spinal stenosis   . TIA (transient ischemic attack)    a. s/p PFO closure 2004 (in [place])  . Ulcer    Past Surgical History:  Procedure Laterality Date  . BACK SURGERY     patient denies-just lumbar punctures  . BRONCHOSCOPY    . CARDIAC CATHETERIZATION  11/99   CI per PMH  . CATARACT EXTRACTION W/PHACO Right 11/24/2016   Procedure: CATARACT EXTRACTION PHACO AND INTRAOCULAR LENS PLACEMENT (IOC);  Surgeon: Birder Robson, MD;  Location: [facility] ORS;  Service: Ophthalmology;  Laterality: Right;  [place] 1:04.3 AP% 22.3 CDE 14.35 Fluid pack lot # [id number]  . CATARACT EXTRACTION W/PHACO Left 12/29/2016   Procedure: CATARACT EXTRACTION PHACO AND INTRAOCULAR LENS PLACEMENT (IOC);  Surgeon: Birder Robson, MD;  Location: [facility] ORS;  Service: Ophthalmology;  Laterality: Left;  [place] 00:52 AP% 18.5 CDE 9.67 Fluid pack lot # [id number]  . COLONOSCOPY    . COLONOSCOPY WITH PROPOFOL N/A 05/25/2017   Procedure: COLONOSCOPY WITH PROPOFOL;  Surgeon: Jonathon Bellows, MD;  Location: [facility] ENDOSCOPY;  Service: Gastroenterology;  Laterality: N/A;  . CORONARY ANGIOPLASTY     STENT  . CORONARY ARTERY BYPASS GRAFT  stent  . ESOPHAGOGASTRODUODENOSCOPY (EGD) WITH PROPOFOL N/A 05/25/2017   Procedure: ESOPHAGOGASTRODUODENOSCOPY (EGD) WITH PROPOFOL;  Surgeon: Jonathon Bellows, MD;  Location: [facility] ENDOSCOPY;  Service: Gastroenterology;  Laterality: N/A;  . EYE SURGERY    . FLEXIBLE BRONCHOSCOPY N/A 05/19/2017   Procedure: FLEXIBLE BRONCHOSCOPY;  Surgeon: Wilhelmina Mcardle, MD;  Location: [facility] ORS;  Service: Pulmonary;  Laterality: N/A;  . open heart surgery  2004   PFO repair  . RIGHT/LEFT HEART CATH AND CORONARY ANGIOGRAPHY N/A 05/03/2018   Procedure: RIGHT/LEFT HEART CATH AND CORONARY ANGIOGRAPHY;   Surgeon: Nelva Bush, MD;  Location: [facility] CV LAB;  Service: Cardiovascular;  Laterality: N/A;  . UPPER GI ENDOSCOPY     No current facility-administered medications on file prior to encounter.    Current Outpatient Medications on File Prior to Encounter  Medication Sig  . acetaminophen (TYLENOL) 500 MG tablet Take 1,000 mg by mouth daily as needed for moderate pain or headache.  Mar[place] Kitchen aspirin 81 MG chewable tablet Chew 1 tablet (81 mg total) by mouth daily.  Marland Kitchen atorvastatin (LIPITOR) 40 MG tablet TAKE 1 TABLET (40 MG TOTAL) BY MOUTH DAILY AT 6 PM.  . budesonide (PULMICORT) 0.5 MG/2ML nebulizer solution Take 2 mLs (0.5 mg total) by nebulization 2 (two) times daily.  . carvedilol (COREG) 12.5 MG tablet Take 1 tablet (12.5 mg total) by mouth 2 (two) times daily.  . cetirizine (ZYRTEC) 10 MG chewable tablet Chew 10 mg by mouth daily.  . Cholecalciferol (VITAMIN D) 2000 units CAPS Take 2,000 Units by mouth every evening.  . diltiazem (CARDIZEM CD) 120 MG 24 hr capsule Take 1 capsule (120 mg total) by mouth daily.  . formoterol (PERFOROMIST) 20 MCG/2ML nebulizer solution Take 2 mLs (20 mcg total) by nebulization 2 (two) times daily. J44.9  . gabapentin (NEURONTIN) 300 MG capsule Take 1 capsule (300 mg total) by mouth at bedtime.  Marland Kitchen ipratropium-albuterol (DUONEB) 0.5-2.5 (3) MG/3ML SOLN Take 3 mLs by nebulization every 4 (four) hours as needed. DX:J44.9 (Patient taking differently: Take 3 mLs by nebulization every 4 (four) hours as needed (for shortness of breath/wheezing). )  . ivabradine (CORLANOR) 5 MG TABS tablet Take 1 tablet (5 mg total) by mouth 2 (two) times daily with a meal.  . LORazepam (ATIVAN) 0.5 MG tablet Take 1 tablet (0.5 mg total) by mouth 3 (three) times daily as needed for anxiety.  Marland Kitchen losartan (COZAAR) 25 MG tablet Take 0.5 tablets (12.5 mg total) by mouth daily.  . Multiple Vitamin (MULTIVITAMIN WITH MINERALS) TABS tablet Take 1 tablet by mouth daily.  Marland Kitchen omeprazole  (PRILOSEC) 20 MG capsule Take 20 mg by mouth daily.  . predniSONE (DELTASONE) 10 MG tablet Take 1 tablet (10 mg total) by mouth daily with breakfast.  . spironolactone (ALDACTONE) 25 MG tablet TAKE 1 TABLET BY MOUTH EVERY DAY  . torsemide (DEMADEX) 20 MG tablet Take 3 tablets (60 mg total) by mouth daily.  . vitamin C (ASCORBIC ACID) 500 MG tablet Take 500 mg by mouth daily.  . benzonatate (TESSALON) 200 MG capsule Take 1 capsule (200 mg total) by mouth 3 (three) times daily.  . blood glucose meter kit and supplies Dispense based on patient and insurance preference. Use to check blood sugar daily.  Marland Kitchen guaiFENesin-codeine 100-10 MG/5ML syrup Take 10 mLs by mouth every 4 (four) hours as needed for cough.  . nystatin (MYCOSTATIN) 100000 UNIT/ML suspension TAKE AS DIRECTED 5 MLS (500,000 UNITS TOTAL) IN THE MOUTH OR THROAT 4 TIMES DAILY (Patient  taking differently: Use as directed 5 mLs in the mouth or throat 4 (four) times daily. )  . ondansetron (ZOFRAN ODT) 4 MG disintegrating tablet Take 1-2 tablets (4-8 mg total) by mouth every 6 (six) hours as needed for nausea or vomiting.  . predniSONE (STERAPRED UNI-PAK 21 TAB) 10 MG (21) TBPK tablet Start at 60m taper by 177muntil complete (Patient not taking: Reported on 12/17/2018)  . tiZANidine (ZANAFLEX) 4 MG tablet Take 1 tablet (4 mg total) by mouth Nightly. (Patient not taking: Reported on 12/17/2018)    Allergies Allergies  Allergen Reactions  . Prilosec [Omeprazole] Diarrhea    Family History Family History  Problem Relation Age of Onset  . Coronary artery disease Other        family hx  . Breast cancer Other        1st egree relative <50  . Cancer Mother   . Heart disease Father    Social History  reports that he quit smoking about 10 months ago. His smoking use included cigarettes. He has a 22.50 pack-year smoking history. He has never used smokeless tobacco. He reports previous alcohol use. He reports that he does not use  drugs.  Review Of Systems:   Constitutional: Positive for fatigue, fever and chills.  HENT: Negative for congestion and rhinorrhea.  Eyes: Negative for redness and visual disturbance.  Respiratory: Positive for for shortness of breath and wheezing.  Cardiovascular: Negative for chest pain and palpitations.  Gastrointestinal: Negative  for nausea , vomiting and abdominal pain and  Loose stools Genitourinary: Negative for dysuria and urgency.  Endocrine: Denies polyuria, polyphagia and heat intolerance Musculoskeletal: Positive for generalized weakness Skin: Negative for pallor and wound.  Neurological: Negative for dizziness and headaches    VITAL SIGNS: BP (!) 121/57 (BP Location: Right Arm)   Pulse 71   Temp (!) 97.4 F (36.3 C) (Oral)   Resp 15   Ht _0  (1.803 m)   Wt 88.5 kg   SpO2 98%   BMI 27.21 kg/m   HEMODYNAMICS:    VENTILATOR SETTINGS:    INTAKE / OUTPUT: I/O last 3 completed shifts: In: 1760.3 [IV Piggyback:1760.3] Out: 400 [Urine:400]  PHYSICAL EXAMINATION: General: Chronically ill looking, in moderate respiratory distress HEENT: PERRLA, trachea midline Neuro: Alert and oriented x3, no focal deficits Cardiovascular: Apical pulse regular, S1-S2, no murmur regurg or gallop, +2 pulses bilaterally Lungs: Bilateral breath sounds diminished in all lung fields, mild expiratory wheezes Abdomen: Nondistended, normal bowel sounds in all 4 quadrants, palpation reveals no organomegaly Musculoskeletal: Positive range of motion, no joint deformities Skin: Warm and dry  LABS:  BMET Recent Labs  Lab 12/17/18 1420 12/18/18 0423  NA 129* 131*  K 4.2 3.8  CL 90* 94*  CO2 29 25  BUN 32* 37*  CREATININE 1.69* 1.21  GLUCOSE 249* 338*    Electrolytes Recent Labs  Lab 12/17/18 1420 12/18/18 0423  CALCIUM 7.7* 7.8*  MG  --  1.4*  PHOS  --  3.2    CBC Recent Labs  Lab 12/17/18 1420 12/18/18 0423  WBC 46.4* 44.6*  HGB 9.6* 8.7*  HCT 28.5* 25.9*   PLT 80* 65*    Coag's Recent Labs  Lab 12/17/18 1420  INR 1.1    Sepsis Markers Recent Labs  Lab 12/17/18 1415 12/17/18 1420 12/17/18 1648 12/17/18 1911  LATICACIDVEN 2.7*  --  2.3* 6.0*  PROCALCITON  --  3.30  --   --     ABG No  results for input(s): PHART, PCO2ART, PO2ART in the last 168 hours.  Liver Enzymes Recent Labs  Lab 12/17/18 1420  AST 34  ALT 47*  ALKPHOS 46  BILITOT 1.2  ALBUMIN 3.1*    Cardiac Enzymes No results for input(s): TROPONINI, PROBNP in the last 168 hours.  Glucose Recent Labs  Lab 12/17/18 1853 12/18/18 0107 12/18/18 0343 12/18/18 0755  GLUCAP 423* 378* 340* 286*    Imaging Dg Chest Port 1 View  Result Date: 12/17/2018 CLINICAL DATA:  Worsening shortness of breath, cough, fever EXAM: PORTABLE CHEST 1 VIEW COMPARISON:  11/27/2018 FINDINGS: Previous median sternotomy. Normal heart size and vascularity. Similar diffuse chronic interstitial changes compatible with chronic lung disease. No effusion or pneumothorax. Trachea is midline. IMPRESSION: Stable chronic interstitial changes. No interval change or superimposed acute process. Electronically Signed   By: Jerilynn Mages.  Shick M.D.   On: 12/17/2018 12:45    STUDIES:  Last 2D echo was in March 2020 and showed an EF of 30 to 35%  CULTURES: Blood cultures x2  ANTIBIOTICS: Cefepime Vancomycin  SIGNIFICANT EVENTS: 12/17/2018: Admitted  ASSESSMENT  Acute hypoxic respiratory failure Sepsis of unknown source Acute COPD exacerbation Hypotension Acute  renal failure Acute on chronic systolic and diastolic heart failure Lactic acidosis-lactic acid is trending up from 2.3 to 6.0 Hyponatremia Type 2 diabetes with severe hyperglycemia with blood glucose in the 400s-last hemoglobin A1c was 11/25/2018 and was 8.6%   PLAN Continue supplemental oxygen Broad-spectrum antibiotics Follow-up cultures Gentle IV hydration with normal saline at 75 cc an hour Trend lactic acid Blood glucose  monitoring with regular aggressive sliding scale insulin coverage Monitor and correct electrolytes Trend creatinine Hold off on diuretics  Best Practice: Code Status: Full code Diet: Carb modified diet GI prophylaxis: Not indicated VTE prophylaxis: SCDs and subcu heparin  COVID-19 DISASTER DECLARATION:   FULL CONTACT PHYSICAL EXAMINATION WAS NOT POSSIBLE DUE TO TREATMENT OF COVID-19  AND CONSERVATION OF PERSONAL PROTECTIVE EQUIPMENT, LIMITED EXAM FINDINGS INCLUDE-  Patient assessed or the symptoms described in the history of present illness.  In the context of the Global COVID-19 pandemic, which necessitated consideration that the patient might be at risk for infection with the SARS-CoV-2 virus that causes COVID-19, Institutional protocols and algorithms that pertain to the evaluation of patients at risk for COVID-19 are in a state of rapid change based on information released by regulatory bodies including the CDC and federal and state organizations. These policies and algorithms were followed during the patient's care while in hospital.  FAMILY  - Updates: Patient updated on current treatment plan.  Keltin Baird S. Tukov-Yual ANP-BC Pulmonary and [place] Pager [phone] or [phone]  NB: This document was prepared using Dragon voice recognition software and may include unintentional dictation errors.    12/18/2018, 9:08 AM

## 2018-12-18 NOTE — Consult Note (Signed)
Pharmacy Antibiotic Note  Tony Watson is a 68 y.o. male admitted on 12/17/2018 with pneumonia.  Pharmacy was consulted for cefepime dosing. He was recently admitted to [facility] for PNA in mid-April 2020 where he was treated with vancomycin for a short period of time and cefepime for 6 days. Also on that admission he grew out Serratia marcescens in his sputum.Since admission vancomycin has been discontinued and his renal function has improved, although not entirely back to baseline.  WBC remains extremely elevated, although he has been afebrile.  Plan: increase cefepime to 2 grams IV every 8 hours  Height: 5\' 11"  (180.3 cm) Weight: 189 lb 11.2 oz (86 kg) IBW/kg (Calculated) : 75.3  Temp (24hrs), Avg:98.1 F (36.7 C), Min:97.4 F (36.3 C), Max:99.7 F (37.6 C)  Recent Labs  Lab 12/17/18 1415 12/17/18 1420 12/17/18 1648 12/17/18 1911 12/18/18 0423  WBC  --  46.4*  --   --  44.6*  CREATININE  --  1.69*  --   --  1.21  LATICACIDVEN 2.7*  --  2.3* 6.0*  --     Estimated Creatinine Clearance: 64 mL/min (by C-G formula based on SCr of 1.21 mg/dL).     Antimicrobials this admission: 5/9 cefepime >>  5/9 vancomycin  >>  5/10  Microbiology results: 5/9 BCx: NGTD 5/9 SARS CoV-2: negative   Thank you for allowing pharmacy to be a part of this patient's care.  Dallie Piles, PharmD Clinical Pharmacist 12/18/2018 11:04 AM

## 2018-12-18 NOTE — TOC Initial Note (Addendum)
Transition of Care [facility]) - Initial/Assessment Note    Patient Details  Name: Tony Watson MRN: [id number] Date of Birth: Jul 13, 1951  Transition of [facility] CM/SW Contact:    Latanya Maudlin, RN Phone Number: 12/18/2018, 12:30 PM  Clinical Narrative:   [facility] team assessed patient as he meets high risk readmission criteria. Patient lives at home with his spouse, had previously refused home health since he did not want anyone entering the home for visits. Patient agreeable to home health for electronic visits only. [facility] program. Per Corene Cornea at [facility] patient can have both services and will follow along to see what they can offer in terms of tele visits for home health. Patient on 4L O2 chronically through [employer]. Also has rolling walker, wheelchair, and power chair. PCP is [facility], utilizes HF clinic as well. [facility] delivers medications but uses [facility] for new scripts.                 Expected Discharge Plan: Home/Self Care Barriers to Discharge: Continued Medical Work up   Patient Goals and CMS Choice   CMS Medicare.gov Compare Post Acute Care list provided to:: Patient Choice offered to / list presented to : Patient  Expected Discharge Plan and Services Expected Discharge Plan: Home/Self Care   Discharge Planning Services: CM Consult Post Acute Care Choice: [place] arrangements for the past 2 months: [place]: [place] Date [place]: 12/18/18 Time HH Agency Contacted: 1230 Representative spoke with at [place]: [place] Arrangements/Services Living arrangements for the past 2 months: [place] with:: Spouse Patient language and need for interpreter reviewed:: No Do you feel safe going back to the place where you live?: Yes            Criminal Activity/Legal Involvement Pertinent to Current  Situation/Hospitalization: No - Comment as needed  Activities of Daily Living      Permission Sought/Granted Permission sought to share information with : Case Manager                Emotional Assessment Appearance:: Appears stated age Attitude/Demeanor/Rapport: Engaged, Gracious Affect (typically observed): Accepting Orientation: : Oriented to Self, Oriented to Place, Oriented to  Time, Oriented to Situation      Admission diagnosis:  COPD exacerbation ([place]) [J44.1] HCAP (healthcare-associated pneumonia) [J18.9] Sepsis, due to unspecified organism, unspecified whether acute organ dysfunction present [facility]) [A41.9] Acute on chronic respiratory failure ([place]) [J96.20] Patient Active Problem List   Diagnosis Date Noted  . COPD with acute bronchitis ([place]) 12/17/2018  . Acute on chronic respiratory failure ([place]) 12/17/2018  . Sepsis ([place]) 11/23/2018  . Acute respiratory failure ([place]) 11/23/2018  . Chronic systolic heart failure ([place]) 05/13/2018  . HTN (hypertension) 05/13/2018  . ILD (interstitial lung disease) ([place])   . DNR / DNI, Advanced Directives Counselling 04/27/2018 04/28/2018    Class: Acute  . Ischemic cardiomyopathy 03/24/2018  . Protein-calorie malnutrition, severe 03/03/2018  . 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 Diabetic autonomic neuropathy associated with type 2 diabetes mellitus ([place]) 03/27/2015  . History of inferior MI (myocardial infarction) 10/14/2013  . CAD (coronary artery disease), native coronary artery   . History of noncompliance with medical treatment 05/30/2012  .  Stage 4 very severe COPD by GOLD classification ([place]) 03/23/2011  . ALLERGIC RHINITIS CAUSE UNSPECIFIED 07/31/2010  . Type 2 diabetes mellitus with vascular disease ([place]) 01/02/2010  . Ex-smoker 04/17/2009  . TRANSIENT ISCHEMIC ATTACKS, HX OF 04/17/2009  . Hyperlipidemia 12/19/2008   PCP:  Owens Loffler, MD Pharmacy:   [place], [place] [address] Phone: [phone] Fax: [phone]  [id number] [place], [place] - [address] [place] [address] Phone: [phone] Fax: [phone]     Social Determinants of Health (SDOH) Interventions    Readmission Risk Interventions Readmission Risk Prevention Plan 12/18/2018 11/28/2018 11/24/2018  Transportation Screening Complete Complete Complete  PCP or Specialist Appt within 3-5 Days - Complete -  HRI or Home Care Consult - Complete -  Medication Review ([place]) Complete Complete Complete  PCP or Specialist appointment within 3-5 days of discharge - - Complete  HRI or Home Care Consult Complete - Complete  SW Recovery Care/Counseling Consult - - Patient refused  Palliative Care Screening - - Not Applicable  Medication Reconcilation ([place] - - Not Applicable  Some recent data might be hidden

## 2018-12-18 NOTE — Progress Notes (Signed)
[facility] NAME: Tony Watson    MR#:  [id number]  DATE OF BIRTH:  June 03, 1951  SUBJECTIVE:  Patient without complaint, breathing easier, feeling better, case discussed with the patient's nurse as well as intensivist-okay to transfer to regular nursing for bed later today, pulmonology will continue to follow, successfully weaned back to her O2 requirement of 5 L via nasal cannula  REVIEW OF SYSTEMS:  CONSTITUTIONAL: No fever, fatigue or weakness.  EYES: No blurred or double vision.  EARS, NOSE, AND THROAT: No tinnitus or ear pain.  RESPIRATORY: No cough, shortness of breath, wheezing or hemoptysis.  CARDIOVASCULAR: No chest pain, orthopnea, edema.  GASTROINTESTINAL: No nausea, vomiting, diarrhea or abdominal pain.  GENITOURINARY: No dysuria, hematuria.  ENDOCRINE: No polyuria, nocturia,  HEMATOLOGY: No anemia, easy bruising or bleeding SKIN: No rash or lesion. MUSCULOSKELETAL: No joint pain or arthritis.   NEUROLOGIC: No tingling, numbness, weakness.  PSYCHIATRY: No anxiety or depression.   ROS  DRUG ALLERGIES:   Allergies  Allergen Reactions  . Prilosec [Omeprazole] Diarrhea    VITALS:  Blood pressure 126/60, pulse 74, temperature (!) 97.4 F (36.3 C), temperature source Oral, resp. rate 19, height 5\' 11"  (1.803 m), weight 86 kg, SpO2 97 %.  PHYSICAL EXAMINATION:  GENERAL:  68 y.o.-year-old patient lying in the bed with no acute distress.  EYES: Pupils equal, round, reactive to light and accommodation. No scleral icterus. Extraocular muscles intact.  HEENT: Head atraumatic, normocephalic. Oropharynx and nasopharynx clear.  NECK:  Supple, no jugular venous distention. No thyroid enlargement, no tenderness.  LUNGS: Normal breath sounds bilaterally, no wheezing, rales,rhonchi or crepitation. No use of accessory muscles of respiration.  CARDIOVASCULAR: S1, S2 normal. No murmurs, rubs, or gallops.  ABDOMEN: Soft, nontender,  nondistended. Bowel sounds present. No organomegaly or mass.  EXTREMITIES: No pedal edema, cyanosis, or clubbing.  NEUROLOGIC: Cranial nerves II through XII are intact. Muscle strength 5/5 in all extremities. Sensation intact. Gait not checked.  PSYCHIATRIC: The patient is alert and oriented x 3.  SKIN: No obvious rash, lesion, or ulcer.   Physical Exam LABORATORY PANEL:   CBC Recent Labs  Lab 12/18/18 0423  WBC 44.6*  HGB 8.7*  HCT 25.9*  PLT 65*   ------------------------------------------------------------------------------------------------------------------  Chemistries  Recent Labs  Lab 12/17/18 1420 12/18/18 0423  NA 129* 131*  K 4.2 3.8  CL 90* 94*  CO2 29 25  GLUCOSE 249* 338*  BUN 32* 37*  CREATININE 1.69* 1.21  CALCIUM 7.7* 7.8*  MG  --  1.4*  AST 34  --   ALT 47*  --   ALKPHOS 46  --   BILITOT 1.2  --    ------------------------------------------------------------------------------------------------------------------  Cardiac Enzymes No results for input(s): TROPONINI in the last 168 hours. ------------------------------------------------------------------------------------------------------------------  RADIOLOGY:  Dg Chest Port 1 View  Result Date: 12/17/2018 CLINICAL DATA:  Worsening shortness of breath, cough, fever EXAM: PORTABLE CHEST 1 VIEW COMPARISON:  11/27/2018 FINDINGS: Previous median sternotomy. Normal heart size and vascularity. Similar diffuse chronic interstitial changes compatible with chronic lung disease. No effusion or pneumothorax. Trachea is midline. IMPRESSION: Stable chronic interstitial changes. No interval change or superimposed acute process. Electronically Signed   By: Jerilynn Mages.  Shick M.D.   On: 12/17/2018 12:45    ASSESSMENT AND PLAN:  *Acute on chronic hypoxic respiratory failure Resolving Discussed with intensivist, okay to transfer to regular nursing for bed later today, currently on 5 L via nasal cannula which is his  baseline  *Acute on COPD exacerbation, acute bronchitis Resolving Continue IV Solu-Medrol with tapering as tolerated, empiric cefepime, aggressive pulmonary toilet bronchodilator therapy, supplemental oxygen per above, follow-up on cultures, respiratory therapy following Chest x-ray negative, COVID-19 test negative. Pulmonology to continue to follow status post transfer to the floor  Followed by Dr. 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 Management plans discussed with the patient, family and they are in agreement.  CODE STATUS: full  TOTAL TIME TAKING CARE OF THIS PATIENT: 35 minutes.    POSSIBLE D/C IN 1-2 DAYS, DEPENDING ON CLINICAL CONDITION.   Avel Peace Salary M.D on 12/18/2018   Between 7am to 6pm - Pager - [phone]  After 6pm go to [email or web address] - password [email or web address] [facility]  Office  [phone]  CC: Primary care physician; Owens Loffler, MD  Note: This dictation was prepared with Dragon dictation along with smaller phrase technology. Any transcriptional errors that result from this process are unintentional.

## 2018-12-19 DIAGNOSIS — J9621 Acute and chronic respiratory failure with hypoxia: Secondary | ICD-10-CM

## 2018-12-19 LAB — GLUCOSE, CAPILLARY
Glucose-Capillary: 369 mg/dL — ABNORMAL HIGH (ref 70–99)
Glucose-Capillary: 402 mg/dL — ABNORMAL HIGH (ref 70–99)
Glucose-Capillary: 318 mg/dL — ABNORMAL HIGH (ref 70–99)
Glucose-Capillary: 328 mg/dL — ABNORMAL HIGH (ref 70–99)

## 2018-12-19 LAB — PATHOLOGIST SMEAR REVIEW

## 2018-12-19 MED ORDER — INSULIN ASPART 100 UNIT/ML ~~LOC~~ SOLN: 5.0000 [IU] | Freq: Three times a day (TID) | SUBCUTANEOUS | Status: DC

## 2018-12-19 MED ORDER — INSULIN ASPART 100 UNIT/ML ~~LOC~~ SOLN
5.0000 [IU] | Freq: Three times a day (TID) | SUBCUTANEOUS | Status: DC
  Administered 2018-12-19 – 2018-12-20 (×4): 5 [IU] via SUBCUTANEOUS
  Filled 2018-12-19 (×3): qty 1

## 2018-12-19 MED ORDER — SODIUM CHLORIDE 0.9% FLUSH
3.0000 mL | Freq: Two times a day (BID) | INTRAVENOUS | Status: DC
  Administered 2018-12-19 – 2018-12-21 (×4): 3 mL via INTRAVENOUS

## 2018-12-19 MED ORDER — SODIUM CHLORIDE 0.9% FLUSH
3.0000 mL | INTRAVENOUS | Status: DC | PRN
  Administered 2018-12-21: 3 mL via INTRAVENOUS
  Filled 2018-12-19: qty 3

## 2018-12-19 MED ORDER — SODIUM CHLORIDE 0.9 % IV SOLN
INTRAVENOUS | Status: DC | PRN
  Administered 2018-12-19 – 2018-12-21 (×4): 500 mL via INTRAVENOUS

## 2018-12-19 NOTE — Progress Notes (Signed)
Inpatient Diabetes Program Recommendations  AACE/ADA: New Consensus Statement on Inpatient Glycemic Control   Target Ranges:  Prepandial:   less than 140 mg/dL      Peak postprandial:   less than 180 mg/dL (1-2 hours)      Critically ill patients:  140 - 180 mg/dL  Results for Tony Watson, Tony Watson (MRN [id number]) as of 12/19/2018 10:29  Ref. Range 12/18/2018 07:55 12/18/2018 11:31 12/18/2018 16:47 12/18/2018 21:12 12/19/2018 08:19  Glucose-Capillary Latest Ref Range: 70 - 99 mg/dL 286 (H) 305 (H) 310 (H) 294 (H) 369 (H)   Results for Tony Watson, Tony Watson (MRN [id number]) as of 12/19/2018 10:29  Ref. Range 03/03/2018 10:45 11/25/2018 13:30  Hemoglobin A1C Latest Ref Range: 4.8 - 5.6 % 6.3 (H) 8.6 (H)   Review of Glycemic Control  Diabetes history: DM2 Outpatient Diabetes medications: None Current orders for Inpatient glycemic control: Lantus 15 units daily, Novolog 0-15 units TID with meals, Novolog 0-5 units QHS; Prednisone 40 mg QAM  Inpatient Diabetes Program Recommendations:   Insulin - Meal Coverage: If steroids are continued, please consider ordering Novolog 5 units TID with meals for meal coverage.  HgbA1C: A1C 8.6% on 11/25/18. Per chart review, noted patient had virtual visit with PCP on 11/30/18 and patient had asked about restarting Metformin but PCP had noted to hold off on restarting Metformin for now. Anticipate patient needs to be prescribed DM medication for outpatient DM control.  NOTE: Noted steroids decreased from Solumedrol to Prednisone 40 mg QAM today.   Thanks, Barnie Alderman, RN, MSN, CDE Diabetes Coordinator Inpatient Diabetes Program [phone] (Team Pager from 8am to 5pm)

## 2018-12-19 NOTE — Progress Notes (Signed)
A bedside spirometry was performed x 3. The best of the 3 was FVC 27% of predicted and the FEV1 was 30% of predicted. The pt gave good effort.

## 2018-12-19 NOTE — Care Management (Signed)
Severe COPD and end stage emphysema; chronic respiratory failure with hypoxemia.  Patient continues to exhibit signs of hypercapnia associated with chronic respiratory failure secondary to severe COPD.  Patient requires the use of NIV both QHS and daytime to help with exacerbation periods.  The use of NIV will treat the patient's high PCO2 levels and can reduce risk of exacerbations and future hospitalizations when used at night and during the day.  Patient will need these advanced settings in conjunction with his current medication regimen; BIPAP with backup rate has been tried and failed.  Failure to have NIV available for use over a 24 hr period could lead to death.  Patient has been able to protect their airway and clear their own secretions..  Tony Watson, RNCM  [phone]  

## 2018-12-19 NOTE — Progress Notes (Signed)
Patient ID: Tony Watson, male   DOB: 1950/08/17, 68 y.o.   MRN: [id number]  [employer] [facility] PROGRESS NOTE  Tony Watson IWP:[id number] DOB: November 15, 1950 DOA: 12/17/2018 PCP: Owens Loffler, MD  HPI/Subjective: Patient came out of the ICU last night.  Still feeling weak.  Still feeling short of breath.  Some cough.  Patient with repeated hospitalizations.  Objective: Vitals:   12/19/18 1448 12/19/18 1631  BP:  (!) 146/54  Pulse:  68  Resp:    Temp:  97.9 F (36.6 C)  SpO2: 96% 98%    Filed Weights   12/17/18 1211 12/17/18 1854 12/18/18 1038  Weight: 83.9 kg 88.5 kg 86 kg    ROS: Review of Systems  Constitutional: Negative for chills and fever.  Eyes: Negative for blurred vision.  Respiratory: Positive for cough and shortness of breath.   Cardiovascular: Negative for chest pain.  Gastrointestinal: Negative for abdominal pain, constipation, diarrhea, nausea and vomiting.  Genitourinary: Negative for dysuria.  Musculoskeletal: Negative for joint pain.  Neurological: Negative for dizziness and headaches.   Exam: Physical Exam  Constitutional: He is oriented to person, place, and time.  HENT:  Nose: No mucosal edema.  Mouth/Throat: No oropharyngeal exudate or posterior oropharyngeal edema.  Eyes: Pupils are equal, round, and reactive to light. Conjunctivae, EOM and lids are normal.  Neck: No JVD present. Carotid bruit is not present. No edema present. No thyroid mass and no thyromegaly present.  Cardiovascular: S1 normal and S2 normal. Exam reveals no gallop.  No murmur heard. Pulses:      Dorsalis pedis pulses are 2+ on the right side and 2+ on the left side.  Respiratory: No respiratory distress. He has decreased breath sounds in the right middle field, the right lower field, the left middle field and the left lower field. He has wheezes in the right middle field and the left middle field. He has rhonchi in the right lower field and the left lower field. He has no  rales.  GI: Soft. Bowel sounds are normal. There is no abdominal tenderness.  Musculoskeletal:     Right ankle: He exhibits no swelling.     Left ankle: He exhibits no swelling.  Lymphadenopathy:    He has no cervical adenopathy.  Neurological: He is alert and oriented to person, place, and time. No cranial nerve deficit.  Skin: Skin is warm. Nails show no clubbing.  Bruising bilateral upper and lower extremities  Psychiatric: He has a normal mood and affect.      Data Reviewed: Basic Metabolic Panel: Recent Labs  Lab 12/17/18 1420 12/18/18 0423  NA 129* 131*  K 4.2 3.8  CL 90* 94*  CO2 29 25  GLUCOSE 249* 338*  BUN 32* 37*  CREATININE 1.69* 1.21  CALCIUM 7.7* 7.8*  MG  --  1.4*  PHOS  --  3.2   Liver Function Tests: Recent Labs  Lab 12/17/18 1420  AST 34  ALT 47*  ALKPHOS 46  BILITOT 1.2  PROT 7.0  ALBUMIN 3.1*   Recent Labs  Lab 12/17/18 1420  LIPASE 23   CBC: Recent Labs  Lab 12/17/18 1420 12/18/18 0423  WBC 46.4* 44.6*  NEUTROABS 30.4*  --   HGB 9.6* 8.7*  HCT 28.5* 25.9*  MCV 101.1* 102.0*  PLT 80* 65*   BNP (last 3 results) Recent Labs    11/23/18 0735 11/24/18 0449 12/18/18 0423  BNP 101.0* 520.0* 361.0*     CBG: Recent Labs  Lab 12/18/18 1647  12/18/18 2112 12/19/18 0819 12/19/18 1200 12/19/18 1632  GLUCAP 310* 294* 369* 402* 318*    Recent Results (from the past 240 hour(s))  Blood Culture (routine x 2)     Status: None (Preliminary result)   Collection Time: 12/17/18 12:15 PM  Result Value Ref Range Status   Specimen Description BLOOD BLOOD LEFT FOREARM  Final   Special Requests   Final    BOTTLES DRAWN AEROBIC AND ANAEROBIC Blood Culture results may not be optimal due to an inadequate volume of blood received in culture bottles   Culture   Final    NO GROWTH 2 DAYS Performed at [facility], [address], [place], [place] [address]    Report Status PENDING  Incomplete  Blood Culture (routine x 2)      Status: None (Preliminary result)   Collection Time: 12/17/18 12:22 PM  Result Value Ref Range Status   Specimen Description BLOOD RAC  Final   Special Requests   Final    BOTTLES DRAWN AEROBIC AND ANAEROBIC Blood Culture adequate volume   Culture   Final    NO GROWTH 2 DAYS Performed at [facility], [address], [place], [place] [address]    Report Status PENDING  Incomplete  SARS Coronavirus 2 [facility] order, Performed in [place] hospital lab)     Status: None   Collection Time: 12/17/18 12:22 PM  Result Value Ref Range Status   SARS Coronavirus 2 NEGATIVE NEGATIVE Final    Comment: (NOTE) If result is NEGATIVE SARS-CoV-2 target nucleic acids are NOT DETECTED. The SARS-CoV-2 RNA is generally detectable in upper and lower  respiratory specimens during the acute phase of infection. The lowest  concentration of SARS-CoV-2 viral copies this assay can detect is 250  copies / mL. A negative result does not preclude SARS-CoV-2 infection  and should not be used as the sole basis for treatment or other  patient management decisions.  A negative result may occur with  improper specimen collection / handling, submission of specimen other  than nasopharyngeal swab, presence of viral mutation(s) within the  areas targeted by this assay, and inadequate number of viral copies  (<250 copies / mL). A negative result must be combined with clinical  observations, patient history, and epidemiological information. If result is POSITIVE SARS-CoV-2 target nucleic acids are DETECTED. The SARS-CoV-2 RNA is generally detectable in upper and lower  respiratory specimens dur ing the acute phase of infection.  Positive  results are indicative of active infection with SARS-CoV-2.  Clinical  correlation with patient history and other diagnostic information is  necessary to determine patient infection status.  Positive results do  not rule out bacterial infection or co-infection with other  viruses. If result is PRESUMPTIVE POSTIVE SARS-CoV-2 nucleic acids MAY BE PRESENT.   A presumptive positive result was obtained on the submitted specimen  and confirmed on repeat testing.  While 2019 novel coronavirus  (SARS-CoV-2) nucleic acids may be present in the submitted sample  additional confirmatory testing may be necessary for epidemiological  and / or clinical management purposes  to differentiate between  SARS-CoV-2 and other Sarbecovirus currently known to infect humans.  If clinically indicated additional testing with an alternate test  methodology [phone]) is advised. The SARS-CoV-2 RNA is generally  detectable in upper and lower respiratory sp ecimens during the acute  phase of infection. The expected result is Negative. Fact Sheet for Patients:  StrictlyIdeas.no Fact Sheet for Healthcare Providers: BankingDealers.co.za This test is  not yet approved or cleared by the [place] and has been authorized for detection and/or diagnosis of SARS-CoV-2 by FDA under an Emergency Use Authorization (EUA).  This EUA will remain in effect (meaning this test can be used) for the duration of the COVID-19 declaration under Section 564(b)(1) of the Act, 21 U.S.C. section 360bbb-3(b)(1), unless the authorization is terminated or revoked sooner. Performed at [facility], [address] [place], [place] [address]       Scheduled Meds: . arformoterol  15 mcg Nebulization Q12H  . aspirin  81 mg Oral Daily  . atorvastatin  40 mg Oral q1800  . budesonide  0.5 mg Nebulization BID  . carvedilol  12.5 mg Oral BID  . cholecalciferol  2,000 Units Oral QPM  . gabapentin  300 mg Oral QHS  . insulin aspart  0-15 Units Subcutaneous TID WC  . insulin aspart  0-5 Units Subcutaneous QHS  . insulin aspart  5 Units Subcutaneous TID WC  . insulin glargine  15 Units Subcutaneous Daily  . ivabradine  5 mg Oral BID WC  .  multivitamin with minerals  1 tablet Oral Daily  . nystatin  5 mL Oral TID PC  . pantoprazole  40 mg Oral Daily  . predniSONE  40 mg Oral Q breakfast  . spironolactone  25 mg Oral Daily  . tiotropium  18 mcg Inhalation Daily  . tiZANidine  4 mg Oral QHS  . torsemide  40 mg Oral Daily  . vitamin C  500 mg Oral Daily   Continuous Infusions: . ceFEPime (MAXIPIME) IV 2 g (12/19/18 1729)    Assessment/Plan:   1. Acute on chronic hypoxic respiratory failure.  Patient on 5 L of oxygen. 2. COPD exacerbation, interstitial lung disease exacerbation.  Patient on prednisone and nebulizer treatments. 3. Repeated pneumonia.  On Maxipime 4. Acute kidney injury this has improved 5. Chronic combined systolic and diastolic congestive heart failure.  Continue aspirin, Coreg losartan torsemide and spironolactone 6. Hyponatremia.  Slightly better today 7. Lactic acidosis 8. Weakness- physical therapy evaluation. 9. History of CML.  White count very elevated with steroids.  Platelet count down.  Patient also anemic.  Code Status:     Code Status Orders  (From admission, onward)         Start     Ordered   12/17/18 1855  Full code  Continuous     12/17/18 1854        Code Status History    Date Active Date Inactive Code Status Order ID Comments User Context   11/23/2018 1540 11/28/2018 1641 DNR [id number]  Saundra Shelling, MD Inpatient   05/03/2018 1107 05/08/2018 1718 DNR [id number]  Nelva Bush, MD Inpatient   05/03/2018 0955 05/03/2018 1107 Full Code [id number]  End, Harrell Gave, MD Inpatient   05/02/2018 1609 05/03/2018 0955 DNR [id number]  Jimmy Footman, NP Inpatient   05/02/2018 1422 05/02/2018 1609 Full Code [id number]  Jimmy Footman, NP Inpatient   05/02/2018 1303 05/02/2018 1422 DNR [id number]  Jimmy Footman, NP Inpatient   05/01/2018 1254 05/02/2018 1303 Full Code [id number]  Nicholes Mango, MD Inpatient   03/24/2018 1948 03/28/2018 1340 Full Code [id number]   Saundra Shelling, MD Inpatient   03/03/2018 0328 03/06/2018 1716 Full Code [id number]  Harrie Foreman, MD Inpatient    Advance Directive Documentation     Most Recent Value  Type of Advance Directive  Healthcare Power of Attorney, Living will  Pre-existing out of facility DNR  order (yellow form or pink MOST form)  -  "MOST" Form in Place?  -     Family Communication: spoke with daughter on the phone Disposition Plan: TBD  Consultants:  Pulmonary  Antibiotics:  maxipime  Time spent: 28 minutes  [place]

## 2018-12-19 NOTE — Progress Notes (Signed)
* [facility]    ASSESSMENT   Acute hypoxic respiratory failure with acute COPD exacerbation   PLAN Continue/wean down supplemental oxygen as tolerated. Continue prednisone taper. Follow-up cultures  OK to DC from respiratory standpoint, pt should follow up with Dr. Mortimer Fries in 2-3 weeks.    Date: 12/19/2018  MRN# [id number] Tony Watson [birth year]   Tony Watson is a 68 y.o. old male seen in follow up for chief complaint of  Chief Complaint  Patient presents with  . Cough  . Shortness of Breath     HPI:  The patient is a 68 year old male, followed in the outpatient setting by Dr. Mortimer Fries for history of severe oxygen dependent COPD, currently maintained on 4-5 L oxygen outpatient.  He also was noted to have a history of chronic systolic congestive heart failure, he had previously undergone a bronchoscopy which showed respiratory bronchiolitis related to smoking.  Currently has been admitted for acute exacerbation of COPD.  Pt is feeling better than when he was admitted, no new complaints.   Currently the patient's oxygen has been weaned down to 5 L via nasal cannula with oxygen saturation of 96%.  Medications include Brovana, Tessalon, nebulized Pulmicort, cefepime, guaifenesin-codeine, duo nebs, Solu-Medrol 40 mg every 12 recently completed and converted to prednisone 40 mg daily, Spiriva.  He is also being maintained on Demadex 40 mg daily and spironolactone 25 mg daily.  Medication:    Current Facility-Administered Medications:  .  acetaminophen (TYLENOL) tablet 1,000 mg, 1,000 mg, Oral, Daily PRN, Vaughan Basta, MD .  arformoterol (BROVANA) nebulizer solution 15 mcg, 15 mcg, Nebulization, Q12H, Wilhelmina Mcardle, MD, 15 mcg at 12/19/18 0804 .  aspirin chewable tablet 81 mg, 81 mg, Oral, Daily, Vaughan Basta, MD, 81 mg at 12/19/18 0842 .  atorvastatin (LIPITOR) tablet 40 mg, 40 mg, Oral, q1800, Vaughan Basta, MD, 40 mg at  12/18/18 1704 .  benzonatate (TESSALON) capsule 200 mg, 200 mg, Oral, Q4H PRN, Wilhelmina Mcardle, MD .  budesonide (PULMICORT) nebulizer solution 0.5 mg, 0.5 mg, Nebulization, BID, Wilhelmina Mcardle, MD, 0.5 mg at 12/19/18 0810 .  carvedilol (COREG) tablet 12.5 mg, 12.5 mg, Oral, BID, Vaughan Basta, MD, 12.5 mg at 12/19/18 0841 .  ceFEPIme (MAXIPIME) 2 g in sodium chloride 0.9 % 100 mL IVPB, 2 g, Intravenous, Q8H, Dallie Piles, RPH, Stopped at 12/19/18 9563 .  cholecalciferol (VITAMIN D) tablet 2,000 Units, 2,000 Units, Oral, QPM, Vaughan Basta, MD, 2,000 Units at 12/18/18 1704 .  docusate sodium (COLACE) capsule 100 mg, 100 mg, Oral, BID PRN, Vaughan Basta, MD .  gabapentin (NEURONTIN) capsule 300 mg, 300 mg, Oral, QHS, Vaughan Basta, MD, 300 mg at 12/18/18 2123 .  guaiFENesin-codeine 100-10 MG/5ML solution 10 mL, 10 mL, Oral, Q4H PRN, Vaughan Basta, MD .  insulin aspart (novoLOG) injection 0-15 Units, 0-15 Units, Subcutaneous, TID WC, Wilhelmina Mcardle, MD, 15 Units at 12/19/18 [id number] .  insulin aspart (novoLOG) injection 0-5 Units, 0-5 Units, Subcutaneous, QHS, Wilhelmina Mcardle, MD, 3 Units at 12/18/18 2124 .  insulin aspart (novoLOG) injection 5 Units, 5 Units, Subcutaneous, TID WC, Wieting, Richard, MD .  insulin glargine (LANTUS) injection 15 Units, 15 Units, Subcutaneous, Daily, Tukov-Yual, Magdalene S, NP, 15 Units at 12/19/18 0843 .  ipratropium-albuterol (DUONEB) 0.5-2.5 (3) MG/3ML nebulizer solution 3 mL, 3 mL, Nebulization, Q4H PRN, Vaughan Basta, MD, 3 mL at 12/19/18 0539 .  ivabradine (CORLANOR) tablet 5 mg, 5 mg, Oral, BID WC, Vaughan Basta, MD,  5 mg at 12/19/18 0842 .  LORazepam (ATIVAN) tablet 0.5 mg, 0.5 mg, Oral, TID PRN, Vaughan Basta, MD, 0.5 mg at 12/18/18 2225 .  multivitamin with minerals tablet 1 tablet, 1 tablet, Oral, Daily, Vaughan Basta, MD, 1 tablet at 12/19/18 0840 .  nystatin (MYCOSTATIN)  100000 UNIT/ML suspension 500,000 Units, 5 mL, Oral, TID PC, Salary, Montell D, MD, 500,000 Units at 12/19/18 0840 .  ondansetron (ZOFRAN-ODT) disintegrating tablet 4-8 mg, 4-8 mg, Oral, Q6H PRN, Vaughan Basta, MD .  pantoprazole (PROTONIX) EC tablet 40 mg, 40 mg, Oral, Daily, Vaughan Basta, MD, 40 mg at 12/19/18 0841 .  predniSONE (DELTASONE) tablet 40 mg, 40 mg, Oral, Q breakfast, Wilhelmina Mcardle, MD, 40 mg at 12/19/18 0841 .  spironolactone (ALDACTONE) tablet 25 mg, 25 mg, Oral, Daily, Wilhelmina Mcardle, MD, 25 mg at 12/19/18 0841 .  tiotropium (SPIRIVA) inhalation capsule ([facility] use ONLY) 18 mcg, 18 mcg, Inhalation, Daily, Vaughan Basta, MD, 18 mcg at 12/19/18 0842 .  tiZANidine (ZANAFLEX) tablet 4 mg, 4 mg, Oral, QHS, Vaughan Basta, MD, 4 mg at 12/18/18 2225 .  torsemide (DEMADEX) tablet 40 mg, 40 mg, Oral, Daily, Wilhelmina Mcardle, MD, 40 mg at 12/19/18 0841 .  vitamin C (ASCORBIC ACID) tablet 500 mg, 500 mg, Oral, Daily, Vaughan Basta, MD, 500 mg at 12/19/18 8413   Allergies:  Prilosec [omeprazole]   Review of Systems:  Constitutional: Feels well. Cardiovascular: Denies chest pain, exertional chest pain.  Pulmonary: Denies hemoptysis, pleuritic chest pain.   The remainder of systems were reviewed and were found to be negative other than what is documented in the HPI.    Physical Examination:   VS: BP 127/66 (BP Location: Left Arm)   Pulse 69   Temp (!) 97.5 F (36.4 C) (Oral)   Resp 19   Ht 5\' 11"  (1.803 m)   Wt 86 kg   SpO2 96%   BMI 26.46 kg/m   General Appearance: No distress  Neuro:without focal findings, mental status, speech normal, alert and oriented HEENT: PERRLA, EOM intact Pulmonary: No wheezing, No rales  CardiovascularNormal S1,S2.  No m/r/g.  Abdomen: Benign, Soft, non-tender, No masses Renal:  No costovertebral tenderness  GU:  No performed at this time. Endoc: No evident thyromegaly, no signs of acromegaly or  Cushing features Skin:   warm, no rashes, no ecchymosis  Extremities: normal, no cyanosis, clubbing.     LABORATORY PANEL:   CBC Recent Labs  Lab 12/18/18 0423  WBC 44.6*  HGB 8.7*  HCT 25.9*  PLT 65*   ------------------------------------------------------------------------------------------------------------------  Chemistries  Recent Labs  Lab 12/17/18 1420 12/18/18 0423  NA 129* 131*  K 4.2 3.8  CL 90* 94*  CO2 29 25  GLUCOSE 249* 338*  BUN 32* 37*  CREATININE 1.69* 1.21  CALCIUM 7.7* 7.8*  MG  --  1.4*  AST 34  --   ALT 47*  --   ALKPHOS 46  --   BILITOT 1.2  --    ------------------------------------------------------------------------------------------------------------------  Cardiac Enzymes No results for input(s): TROPONINI in the last 168 hours. ------------------------------------------------------------  RADIOLOGY:   No results found for this or any previous visit. Results for orders placed during the hospital encounter of 11/23/18  DG Chest 2 View   Narrative CLINICAL DATA:  Worsening cough  EXAM: CHEST - 2 VIEW  COMPARISON:  11/26/2018  FINDINGS: Bilateral chronic interstitial thickening. No pleural effusion or pneumothorax. No focal consolidation. Stable cardiomediastinal silhouette. Prior median sternotomy. No acute osseous abnormality.  IMPRESSION: 1. No acute cardiopulmonary disease. 2. Chronic interstitial lung disease.   Electronically Signed   By: Kathreen Devoid   On: 11/27/2018 12:35    ------------------------------------------------------------------------------------------------------------------  Thank  you for allowing [facility] to assist in the care of your patient. Our recommendations are noted above.  Please contact us if we can be of further service.   Marda Stalker, M.D., F.C.C.P.  Board Certified in Internal Medicine, Pulmonary Medicine, [place], and Sleep  Medicine.  [facility] Office Number: [phone]  12/19/2018

## 2018-12-19 NOTE — TOC Progression Note (Signed)
Transition of Care [facility]) - Progression Note    Patient Details  Name: Tony Watson MRN: [id number] Date of Birth: 06-Aug-1951  Transition of Care [facility]) CM/SW Contact  Elza Rafter, RN Phone Number: 12/19/2018, 9:40 AM  Clinical Narrative:   Patient currently on 5L oxygen.  Has a functioning scale at home and weighs daily.  Current with heart failure clinic.      Expected Discharge Plan: Home/Self Care Barriers to Discharge: Continued Medical Work up  Expected Discharge Plan and Services Expected Discharge Plan: Home/Self Care   Discharge Planning Services: CM Consult Post Acute Care Choice: [place] arrangements for the past 2 months: [place]: [place] ([place]) Date [place]: 12/18/18 Time [place]: 1230 Representative spoke with at [place]: [place] ([place]) Interventions    Readmission Risk Interventions Readmission Risk Prevention Plan 12/18/2018 11/28/2018 11/24/2018  Transportation Screening Complete Complete Complete  PCP or Specialist Appt within 3-5 Days - Complete -  HRI or [place] - Complete -  Medication Review Press photographer) Complete Complete Complete  PCP or Specialist appointment within 3-5 days of discharge - - Complete  HRI or Home Care Consult Complete - Complete  SW Recovery Care/Counseling Consult - - Patient refused  Palliative Care Screening - - Not Applicable  Medication Reconcilation ([place] - - Not Applicable  Some recent data might be hidden

## 2018-12-19 NOTE — Consult Note (Signed)
Pharmacy Antibiotic Note  Tony Watson is a 68 y.o. male admitted on 12/17/2018 with pneumonia.  Pharmacy has been consulted for cefepime dosing. He was recently admitted to [facility] for PNA in mid-April 2020 where he was treated with vancomycin for a short period of time and cefepime for 6 days  Plan: Due to improvement in Scr will increase frequency of cefepime 2 grams IV to every 8 hours.  Height: 5\' 11"  (180.3 cm) Weight: 189 lb 11.2 oz (86 kg) IBW/kg (Calculated) : 75.3  Temp (24hrs), Avg:97.5 F (36.4 C), Min:97.5 F (36.4 C), Max:97.6 F (36.4 C)  Recent Labs  Lab 12/17/18 1415 12/17/18 1420 12/17/18 1648 12/17/18 1911 12/18/18 0423  WBC  --  46.4*  --   --  44.6*  CREATININE  --  1.69*  --   --  1.21  LATICACIDVEN 2.7*  --  2.3* 6.0*  --     Estimated Creatinine Clearance: 64 mL/min (by C-G formula based on SCr of 1.21 mg/dL).     Antimicrobials this admission: 5/9 cefepime >>  5/9 vancomycin  >> 5/10  Microbiology results: 5/9 BCx: pending  5/9 SARS CoV-2: negative   Thank you for allowing pharmacy to be a part of this patient's care.  Oswald Hillock, PharmD, BCPS Clinical Pharmacist 12/19/2018 11:16 AM

## 2018-12-20 LAB — GLUCOSE, CAPILLARY
Glucose-Capillary: 311 mg/dL — ABNORMAL HIGH (ref 70–99)
Glucose-Capillary: 297 mg/dL — ABNORMAL HIGH (ref 70–99)
Glucose-Capillary: 275 mg/dL — ABNORMAL HIGH (ref 70–99)
Glucose-Capillary: 284 mg/dL — ABNORMAL HIGH (ref 70–99)

## 2018-12-20 LAB — BASIC METABOLIC PANEL
Anion gap: 11 (ref 5–15)
BUN: 38 mg/dL — ABNORMAL HIGH (ref 8–23)
CO2: 30 mmol/L (ref 22–32)
Calcium: 8.4 mg/dL — ABNORMAL LOW (ref 8.9–10.3)
Chloride: 95 mmol/L — ABNORMAL LOW (ref 98–111)
Creatinine, Ser: 0.99 mg/dL (ref 0.61–1.24)
GFR calc Af Amer: >60 mL/min (ref >60)
GFR calc non Af Amer: >60 mL/min (ref >60)
Glucose, Bld: 340 mg/dL — ABNORMAL HIGH (ref 70–99)
Potassium: 4.1 mmol/L (ref 3.5–5.1)
Sodium: 136 mmol/L (ref 135–145)

## 2018-12-20 LAB — CBC
HCT: 29.4 % — ABNORMAL LOW (ref 39.0–52.0)
Hemoglobin: 9.6 g/dL — ABNORMAL LOW (ref 13.0–17.0)
MCH: 33.7 pg (ref 26.0–34.0)
MCHC: 32.7 g/dL (ref 30.0–36.0)
MCV: 103.2 fL — ABNORMAL HIGH (ref 80.0–100.0)
Platelets: 44 10*3/uL — ABNORMAL LOW (ref 150–400)
RBC: 2.85 MIL/uL — ABNORMAL LOW (ref 4.22–5.81)
RDW: 15.8 % — ABNORMAL HIGH (ref 11.5–15.5)
WBC: 47.8 10*3/uL — ABNORMAL HIGH (ref 4.0–10.5)
nRBC: 0.1 % (ref 0.0–0.2)

## 2018-12-20 MED ORDER — INSULIN GLARGINE 100 UNIT/ML ~~LOC~~ SOLN
20.0000 [IU] | Freq: Every day | SUBCUTANEOUS | Status: DC
  Administered 2018-12-21: 20 [IU] via SUBCUTANEOUS
  Filled 2018-12-20: qty 0.2

## 2018-12-20 MED ORDER — INSULIN ASPART 100 UNIT/ML ~~LOC~~ SOLN
8.0000 [IU] | Freq: Three times a day (TID) | SUBCUTANEOUS | Status: DC
  Administered 2018-12-20 – 2018-12-21 (×2): 8 [IU] via SUBCUTANEOUS
  Filled 2018-12-20 (×2): qty 1

## 2018-12-20 MED ORDER — ALBUTEROL SULFATE (2.5 MG/3ML) 0.083% IN NEBU
2.5000 mg | INHALATION_SOLUTION | RESPIRATORY_TRACT | Status: DC | PRN
  Administered 2018-12-21: 2.5 mg via RESPIRATORY_TRACT
  Filled 2018-12-20: qty 3

## 2018-12-20 MED ORDER — PREDNISONE 10 MG PO TABS
30.0000 mg | ORAL_TABLET | Freq: Every day | ORAL | Status: DC
  Administered 2018-12-21: 30 mg via ORAL
  Filled 2018-12-20: qty 3

## 2018-12-20 MED ORDER — IPRATROPIUM-ALBUTEROL 0.5-2.5 (3) MG/3ML IN SOLN
3.0000 mL | Freq: Four times a day (QID) | RESPIRATORY_TRACT | Status: DC
  Administered 2018-12-20 – 2018-12-21 (×4): 3 mL via RESPIRATORY_TRACT
  Filled 2018-12-20 (×4): qty 3

## 2018-12-20 MED ORDER — TORSEMIDE 20 MG PO TABS
40.0000 mg | ORAL_TABLET | Freq: Once | ORAL | Status: AC
  Administered 2018-12-20: 40 mg via ORAL
  Filled 2018-12-20: qty 2

## 2018-12-20 NOTE — Progress Notes (Signed)
Inpatient Diabetes Program Recommendations  AACE/ADA: New Consensus Statement on Inpatient Glycemic Control   Target Ranges:  Prepandial:   less than 140 mg/dL      Peak postprandial:   less than 180 mg/dL (1-2 hours)      Critically ill patients:  140 - 180 mg/dL   Results for Tony Watson, Tony Watson (MRN [id number]) as of 12/20/2018 09:55  Ref. Range 12/19/2018 08:19 12/19/2018 12:00 12/19/2018 16:32 12/19/2018 20:41 12/20/2018 07:54  Glucose-Capillary Latest Ref Range: 70 - 99 mg/dL 369 (H) 402 (H) 318 (H) 328 (H) 311 (H)  Results for Tony Watson, Tony Watson (MRN [id number]) as of 12/20/2018 09:55  Ref. Range 03/03/2018 10:45 11/25/2018 13:30  Hemoglobin A1C Latest Ref Range: 4.8 - 5.6 % 6.3 (H) 8.6 (H)   Review of Glycemic Control  Diabetes history: DM2 Outpatient Diabetes medications: None Current orders for Inpatient glycemic control: Lantus 15 units daily, Novolog 0-15 units TID with meals, Novolog 0-5 units QHS; Prednisone 40 mg QAM  Inpatient Diabetes Program Recommendations:   Insulin-Basal: If steroids are continued, please consider increasing Lantus to 20 units daily.  Insulin - Meal Coverage: If steroids are continued, please consider increasing meal coverage to Novolog 8 units TID with meals.  HgbA1C: A1C 8.6% on 11/25/18. Per chart review, noted patient had virtual visit with PCP on 11/30/18 and patient had asked about restarting Metformin but PCP had noted to hold off on restarting Metformin for now. Anticipate patient needs to be prescribed DM medication for outpatient DM control.  Thanks, Barnie Alderman, RN, MSN, CDE Diabetes Coordinator Inpatient Diabetes Program [phone] (Team Pager from 8am to 5pm)

## 2018-12-20 NOTE — Evaluation (Signed)
Physical Therapy Evaluation Patient Details Name: Tony Watson MRN: [id number] DOB: 08/16/1950 Today's Date: 12/20/2018   History of Present Illness  Pt is a 68 y.o. male presenting to hospital 12/17/18 with cough and SOB.  (-) COVID 19 test.  Pt admitted with acute on chronic respiratory failure, acute bronchitis, COPD exacerbation, acute renal failure, chronic diastolic and systolic CHF, hyponatremia, and macrocytic anemia.  PMH includes CAD, combined systolic and diastolic heart failure, chronic respiratory failure with hypoxica, CML, COPD, DM, spinal stenosis, MI, neuropathy, and CABG.  On 5 L home O2 chronic.  Clinical Impression  Prior to hospital admission, pt was ambulatory with [facility]; uses 5 L home O2 chronic.  Pt lives with his wife on main level of handicap set-up home (elevator access to 2nd floor where additional family lives); level entry.  Currently pt is modified independent with bed mobility and transfers, and CGA ambulating 40 feet in room (pt holding onto furniture intermittently for balance).  O2 sats 92% on 5 L O2 via nasal cannula at rest; pt switched to 6 L O2 via nasal cannula for ambulation and O2 sats decreased to 82-83% (with sitting rest break pt's O2 increased back to 92% on 5 L after a few minutes of sitting rest break).  Pt would benefit from skilled PT to address noted impairments and functional limitations (see below for any additional details).  No follow-up PT upon hospital discharge anticipated (pt in agreement).    Follow Up Recommendations No PT follow up    Equipment Recommendations  None recommended by PT(Pt has required DME at home already)    Recommendations for Other Services       Precautions / Restrictions Precautions Precautions: Fall Precaution Comments: Monitor O2 Restrictions Weight Bearing Restrictions: No      Mobility  Bed Mobility Overal bed mobility: Modified Independent             General bed mobility comments: Semi-supine to  sit without any noted difficulties.  Transfers Overall transfer level: Modified independent Equipment used: (single UE support for balance) Transfers: Sit to/from Stand Sit to Stand: Modified independent (Device/Increase time)         General transfer comment: pt with single UE support on bed rail for balance standing  Ambulation/Gait Ambulation/Gait assistance: Min guard Gait Distance (Feet): 40 Feet Assistive device: (pt holding onto furniture intermittently in room for balance)   Gait velocity: decreased   General Gait Details: mild shakiness with increased distance ambulating; pt holding onto furniture intermittently in room for balance (usually uses SPC); increased SOB noted with increased distance ambulating  Stairs            Wheelchair Mobility    Modified Rankin (Stroke Patients Only)       Balance Overall balance assessment: Needs assistance Sitting-balance support: No upper extremity supported;Feet supported Sitting balance-Leahy Scale: Normal Sitting balance - Comments: steady sitting reaching outside BOS   Standing balance support: No upper extremity supported Standing balance-Leahy Scale: Good Standing balance comment: steady standing reaching within BOS                             Pertinent Vitals/Pain Pain Assessment: No/denies pain  HR WFL during session.    Home Living Family/patient expects to be discharged to:: Private residence Living Arrangements: Spouse/significant other;Other relatives(lives with wife on main level; daughter, son in law, and 20 y.o. grandchild on 2nd floor (elevator access to 2nd floor)) Available Help  at Discharge: Family;Available 24 hours/day Type of Home: House Home Access: Level entry;Elevator     Home Layout: Able to live on main level with bedroom/bathroom Home Equipment: Toilet riser;Walker - 4 wheels;Cane - single point;Grab bars - tub/shower;Grab bars - toilet;Wheelchair - Actuary;Bedside commode      Prior Function Level of Independence: Independent with assistive device(s)         Comments: Ambulates with SPC.  On 5 L home O2 chronic.  Also has "trolleys" on ceiling (with electric motors) that carries him around house if needed.  Elevator to 2nd floor to visit family.  Has 24/7 assist.     Hand Dominance        Extremity/Trunk Assessment   Upper Extremity Assessment Upper Extremity Assessment: Generalized weakness    Lower Extremity Assessment Lower Extremity Assessment: Generalized weakness    Cervical / Trunk Assessment Cervical / Trunk Assessment: Normal  Communication   Communication: No difficulties  Cognition Arousal/Alertness: Awake/alert Behavior During Therapy: WFL for tasks assessed/performed Overall Cognitive Status: Within Functional Limits for tasks assessed                                        General Comments   Nursing cleared pt for participation in physical therapy.  Pt agreeable to PT session.    Exercises  Pursed lip breathing post ambulation.   Assessment/Plan    PT Assessment Patient needs continued PT services  PT Problem List Decreased strength;Decreased activity tolerance;Decreased balance;Decreased mobility;Cardiopulmonary status limiting activity       PT Treatment Interventions DME instruction;Gait training;Functional mobility training;Therapeutic activities;Therapeutic exercise;Balance training;Patient/family education    PT Goals (Current goals can be found in the Care Plan section)  Acute Rehab PT Goals Patient Stated Goal: to go home PT Goal Formulation: With patient Time For Goal Achievement: 12/23/18 Potential to Achieve Goals: Good    Frequency Min 2X/week   Barriers to discharge        Co-evaluation               AM-PAC PT "6 Clicks" Mobility  Outcome Measure Help needed turning from your back to your side while in a flat bed without using bedrails?:  None Help needed moving from lying on your back to sitting on the side of a flat bed without using bedrails?: None Help needed moving to and from a bed to a chair (including a wheelchair)?: None Help needed standing up from a chair using your arms (e.g., wheelchair or bedside chair)?: None Help needed to walk in hospital room?: A Little Help needed climbing 3-5 steps with a railing? : A Little 6 Click Score: 22    End of Session Equipment Utilized During Treatment: Gait belt Activity Tolerance: Other (comment)(Limited d/t O2 desaturation with activity) Patient left: in chair;with call bell/phone within reach;with SCD's reapplied Nurse Communication: Mobility status;Precautions;Other (comment)(O2 desaturation) PT Visit Diagnosis: Unsteadiness on feet (R26.81);Muscle weakness (generalized) (M62.81);Difficulty in walking, not elsewhere classified (R26.2)    Time: 5427-0623 PT Time Calculation (min) (ACUTE ONLY): 22 min   Charges:   PT Evaluation $PT Eval Low Complexity: 1 Low         Oni Dietzman, PT 12/20/18, 10:38 AM [phone]

## 2018-12-20 NOTE — Care Management Important Message (Signed)
Important Message  Patient Details  Name: Tony Watson MRN: [id number] Date of Birth: 11-Oct-1950   Medicare Important Message Given:  Yes    Dannette Barbara 12/20/2018, 10:58 AM

## 2018-12-20 NOTE — Progress Notes (Signed)
* [facility]    ASSESSMENT   Acute hypoxic respiratory failure with acute COPD exacerbation Continues to have significant desat on exertion with 6L   PLAN Continue/wean down supplemental oxygen as tolerated. Will extra dose of torsemide this afternoon.  Continue prednisone taper. Follow-up cultures Discussed code status with pt and with pt PCP, confirmed DNR status, will change to DNR.   OK to DC from respiratory standpoint, pt should follow up with Dr. Mortimer Fries in 2-3 weeks.    Date: 12/20/2018  MRN# [id number] Tony Watson 14-Apr-1951   Tony Watson is a 68 y.o. old male seen in follow up for chief complaint of  Chief Complaint  Patient presents with  . Cough  . Shortness of Breath     HPI:  The patient is a 68 year old male, followed in the outpatient setting by Dr. Mortimer Fries for history of severe oxygen dependent COPD, currently maintained on 4-5 L oxygen outpatient.  He also was noted to have a history of chronic systolic congestive heart failure, he had previously undergone a bronchoscopy which showed respiratory bronchiolitis related to smoking.  Currently has been admitted for acute exacerbation of COPD.  Pt is feeling better than when he was admitted, no new complaints.   Currently the patient's oxygen has been weaned down to 5 L via nasal cannula with oxygen saturation of 96%.  Medications include Brovana, Tessalon, nebulized Pulmicort, cefepime, guaifenesin-codeine, duo nebs, Solu-Medrol 40 mg every 12 recently completed and converted to prednisone 40 mg daily, Spiriva.  He is also being maintained on Demadex 40 mg daily and spironolactone 25 mg daily.  Medication:    Current Facility-Administered Medications:  .  0.9 %  sodium chloride infusion, , Intravenous, PRN, Loletha Grayer, MD, Last Rate: 10 mL/hr at 12/20/18 0627, 500 mL at 12/20/18 0627 .  acetaminophen (TYLENOL) tablet 1,000 mg, 1,000 mg, Oral, Daily PRN, Vaughan Basta, MD .   albuterol (PROVENTIL) (2.5 MG/3ML) 0.083% nebulizer solution 2.5 mg, 2.5 mg, Nebulization, Q2H PRN, Wieting, Richard, MD .  arformoterol (BROVANA) nebulizer solution 15 mcg, 15 mcg, Nebulization, Q12H, Wilhelmina Mcardle, MD, 15 mcg at 12/20/18 0736 .  aspirin chewable tablet 81 mg, 81 mg, Oral, Daily, Vaughan Basta, MD, 81 mg at 12/19/18 0842 .  atorvastatin (LIPITOR) tablet 40 mg, 40 mg, Oral, q1800, Vaughan Basta, MD, 40 mg at 12/19/18 1714 .  benzonatate (TESSALON) capsule 200 mg, 200 mg, Oral, Q4H PRN, Wilhelmina Mcardle, MD, 200 mg at 12/20/18 0028 .  budesonide (PULMICORT) nebulizer solution 0.5 mg, 0.5 mg, Nebulization, BID, Wilhelmina Mcardle, MD, 0.5 mg at 12/20/18 0736 .  carvedilol (COREG) tablet 12.5 mg, 12.5 mg, Oral, BID, Vaughan Basta, MD, 12.5 mg at 12/19/18 2024 .  ceFEPIme (MAXIPIME) 2 g in sodium chloride 0.9 % 100 mL IVPB, 2 g, Intravenous, Q8H, Dallie Piles, RPH, Last Rate: 200 mL/hr at 12/20/18 0628, 2 g at 12/20/18 9622 .  cholecalciferol (VITAMIN D) tablet 2,000 Units, 2,000 Units, Oral, QPM, Vaughan Basta, MD, 2,000 Units at 12/19/18 1714 .  docusate sodium (COLACE) capsule 100 mg, 100 mg, Oral, BID PRN, Vaughan Basta, MD .  gabapentin (NEURONTIN) capsule 300 mg, 300 mg, Oral, QHS, Vaughan Basta, MD, 300 mg at 12/19/18 2024 .  guaiFENesin-codeine 100-10 MG/5ML solution 10 mL, 10 mL, Oral, Q4H PRN, Vaughan Basta, MD, 10 mL at 12/20/18 0028 .  insulin aspart (novoLOG) injection 0-15 Units, 0-15 Units, Subcutaneous, TID WC, Wilhelmina Mcardle, MD, 11 Units at 12/20/18 0840 .  insulin aspart (novoLOG) injection 0-5 Units, 0-5 Units, Subcutaneous, QHS, Wilhelmina Mcardle, MD, 4 Units at 12/19/18 2112 .  insulin aspart (novoLOG) injection 5 Units, 5 Units, Subcutaneous, TID WC, Loletha Grayer, MD, 5 Units at 12/20/18 0841 .  insulin glargine (LANTUS) injection 15 Units, 15 Units, Subcutaneous, Daily, Tukov-Yual, Magdalene  S, NP, 15 Units at 12/19/18 0843 .  ipratropium-albuterol (DUONEB) 0.5-2.5 (3) MG/3ML nebulizer solution 3 mL, 3 mL, Nebulization, Q6H, Wieting, Richard, MD .  ivabradine (CORLANOR) tablet 5 mg, 5 mg, Oral, BID WC, Vaughan Basta, MD, 5 mg at 12/19/18 1714 .  LORazepam (ATIVAN) tablet 0.5 mg, 0.5 mg, Oral, TID PRN, Vaughan Basta, MD, 0.5 mg at 12/20/18 0248 .  multivitamin with minerals tablet 1 tablet, 1 tablet, Oral, Daily, Vaughan Basta, MD, 1 tablet at 12/19/18 0840 .  nystatin (MYCOSTATIN) 100000 UNIT/ML suspension 500,000 Units, 5 mL, Oral, TID PC, Salary, Montell D, MD, 500,000 Units at 12/20/18 0846 .  ondansetron (ZOFRAN-ODT) disintegrating tablet 4-8 mg, 4-8 mg, Oral, Q6H PRN, Vaughan Basta, MD .  pantoprazole (PROTONIX) EC tablet 40 mg, 40 mg, Oral, Daily, Vaughan Basta, MD, 40 mg at 12/19/18 0841 .  predniSONE (DELTASONE) tablet 40 mg, 40 mg, Oral, Q breakfast, Wilhelmina Mcardle, MD, 40 mg at 12/20/18 0842 .  sodium chloride flush (NS) 0.9 % injection 3 mL, 3 mL, Intravenous, Q12H, Wieting, Richard, MD, 3 mL at 12/19/18 2025 .  sodium chloride flush (NS) 0.9 % injection 3 mL, 3 mL, Intravenous, PRN, Wieting, Richard, MD .  spironolactone (ALDACTONE) tablet 25 mg, 25 mg, Oral, Daily, Wilhelmina Mcardle, MD, 25 mg at 12/19/18 0841 .  tiotropium (SPIRIVA) inhalation capsule ([facility] use ONLY) 18 mcg, 18 mcg, Inhalation, Daily, Vaughan Basta, MD, 18 mcg at 12/19/18 0842 .  tiZANidine (ZANAFLEX) tablet 4 mg, 4 mg, Oral, QHS, Vaughan Basta, MD, 4 mg at 12/19/18 2024 .  torsemide (DEMADEX) tablet 40 mg, 40 mg, Oral, Daily, Wilhelmina Mcardle, MD, 40 mg at 12/19/18 0841 .  vitamin C (ASCORBIC ACID) tablet 500 mg, 500 mg, Oral, Daily, Vaughan Basta, MD, 500 mg at 12/19/18 2595   Allergies:  Prilosec [omeprazole]   Review of Systems:  Constitutional: Feels well. Cardiovascular: Denies chest pain, exertional chest pain.   Pulmonary: Denies hemoptysis, pleuritic chest pain.   The remainder of systems were reviewed and were found to be negative other than what is documented in the HPI.    Physical Examination:   VS: BP 134/66 (BP Location: Left Arm)   Pulse 66   Temp 98.5 F (36.9 C) (Oral)   Resp 18   Ht 5\' 11"  (1.803 m)   Wt 88.9 kg   SpO2 98%   BMI 27.34 kg/m   General Appearance: No distress  Neuro:without focal findings, mental status, speech normal, alert and oriented HEENT: PERRLA, EOM intact Pulmonary: No wheezing, No rales  CardiovascularNormal S1,S2.  No m/r/g.  Abdomen: Benign, Soft, non-tender, No masses Renal:  No costovertebral tenderness  GU:  No performed at this time. Endoc: No evident thyromegaly, no signs of acromegaly or Cushing features Skin:   warm, no rashes, no ecchymosis  Extremities: normal, no cyanosis, clubbing.     LABORATORY PANEL:   CBC Recent Labs  Lab 12/20/18 0255  WBC 47.8*  HGB 9.6*  HCT 29.4*  PLT 44*   ------------------------------------------------------------------------------------------------------------------  Chemistries  Recent Labs  Lab 12/17/18 1420 12/18/18 0423 12/20/18 0255  NA 129* 131* 136  K 4.2 3.8 4.1  CL 90*  94* 95*  CO2 29 25 30   GLUCOSE 249* 338* 340*  BUN 32* 37* 38*  CREATININE 1.69* 1.21 0.99  CALCIUM 7.7* 7.8* 8.4*  MG  --  1.4*  --   AST 34  --   --   ALT 47*  --   --   ALKPHOS 46  --   --   BILITOT 1.2  --   --    ------------------------------------------------------------------------------------------------------------------  Cardiac Enzymes No results for input(s): TROPONINI in the last 168 hours. ------------------------------------------------------------  RADIOLOGY:   No results found for this or any previous visit. Results for orders placed during the hospital encounter of 11/23/18  DG Chest 2 View   Narrative CLINICAL DATA:  Worsening cough  EXAM: CHEST - 2 VIEW  COMPARISON:   11/26/2018  FINDINGS: Bilateral chronic interstitial thickening. No pleural effusion or pneumothorax. No focal consolidation. Stable cardiomediastinal silhouette. Prior median sternotomy. No acute osseous abnormality.  IMPRESSION: 1. No acute cardiopulmonary disease. 2. Chronic interstitial lung disease.   Electronically Signed   By: Kathreen Devoid   On: 11/27/2018 12:35    ------------------------------------------------------------------------------------------------------------------  Thank  you for allowing [facility] to assist in the care of your patient. Our recommendations are noted above.  Please contact us if we can be of further service.   Marda Stalker, M.D., F.C.C.P.  Board Certified in Internal Medicine, Pulmonary Medicine, [place], and Sleep Medicine.  [facility] Office Number: [phone]  12/20/2018

## 2018-12-20 NOTE — Progress Notes (Signed)
Patient ID: Tony Watson, male   DOB: 1951/02/04, 68 y.o.   MRN: [id number]  [facility] PROGRESS NOTE  Tony Watson [id number]:[id number] DOB: August 31, 1950 DOA: 12/17/2018 PCP: Owens Loffler, MD  HPI/Subjective: Patient still very short of breath with limited motion.  Desaturates with limited motion.  Still with some cough and shortness of breath.  Objective: Vitals:   12/20/18 0810 12/20/18 1358  BP: 134/66   Pulse: 66 81  Resp: 18 18  Temp: 98.5 F (36.9 C)   SpO2: 98% 95%    Filed Weights   12/18/18 1038 12/20/18 0435 12/20/18 0437  Weight: 86 kg 88.5 kg 88.9 kg    ROS: Review of Systems  Constitutional: Negative for chills and fever.  Eyes: Negative for blurred vision.  Respiratory: Positive for cough and shortness of breath.   Cardiovascular: Negative for chest pain.  Gastrointestinal: Negative for abdominal pain, constipation, diarrhea, nausea and vomiting.  Genitourinary: Negative for dysuria.  Musculoskeletal: Negative for joint pain.  Neurological: Negative for dizziness and headaches.   Exam: Physical Exam  Constitutional: He is oriented to person, place, and time.  HENT:  Nose: No mucosal edema.  Mouth/Throat: No oropharyngeal exudate or posterior oropharyngeal edema.  Eyes: Pupils are equal, round, and reactive to light. Conjunctivae, EOM and lids are normal.  Neck: No JVD present. Carotid bruit is not present. No edema present. No thyroid mass and no thyromegaly present.  Cardiovascular: S1 normal and S2 normal. Exam reveals no gallop.  No murmur heard. Pulses:      Dorsalis pedis pulses are 2+ on the right side and 2+ on the left side.  Respiratory: No respiratory distress. He has decreased breath sounds in the right middle field, the right lower field, the left middle field and the left lower field. He has no wheezes. He has rhonchi in the right lower field and the left lower field. He has no rales.  GI: Soft. Bowel sounds are normal. There is no  abdominal tenderness.  Musculoskeletal:     Right ankle: He exhibits no swelling.     Left ankle: He exhibits no swelling.  Lymphadenopathy:    He has no cervical adenopathy.  Neurological: He is alert and oriented to person, place, and time. No cranial nerve deficit.  Skin: Skin is warm. Nails show no clubbing.  Bruising bilateral upper and lower extremities  Psychiatric: He has a normal mood and affect.      Data Reviewed: Basic Metabolic Panel: Recent Labs  Lab 12/17/18 1420 12/18/18 0423 12/20/18 0255  NA 129* 131* 136  K 4.2 3.8 4.1  CL 90* 94* 95*  CO2 29 25 30   GLUCOSE 249* 338* 340*  BUN 32* 37* 38*  CREATININE 1.69* 1.21 0.99  CALCIUM 7.7* 7.8* 8.4*  MG  --  1.4*  --   PHOS  --  3.2  --    Liver Function Tests: Recent Labs  Lab 12/17/18 1420  AST 34  ALT 47*  ALKPHOS 46  BILITOT 1.2  PROT 7.0  ALBUMIN 3.1*   Recent Labs  Lab 12/17/18 1420  LIPASE 23   CBC: Recent Labs  Lab 12/17/18 1420 12/18/18 0423 12/20/18 0255  WBC 46.4* 44.6* 47.8*  NEUTROABS 30.4*  --   --   HGB 9.6* 8.7* 9.6*  HCT 28.5* 25.9* 29.4*  MCV 101.1* 102.0* 103.2*  PLT 80* 65* 44*   BNP (last 3 results) Recent Labs    11/23/18 0735 11/24/18 0449 12/18/18 0423  BNP 101.0*  520.0* 361.0*     CBG: Recent Labs  Lab 12/19/18 1200 12/19/18 1632 12/19/18 2041 12/20/18 0754 12/20/18 1130  GLUCAP 402* 318* 328* 311* 297*    Recent Results (from the past 240 hour(s))  Blood Culture (routine x 2)     Status: None (Preliminary result)   Collection Time: 12/17/18 12:15 PM  Result Value Ref Range Status   Specimen Description BLOOD BLOOD LEFT FOREARM  Final   Special Requests   Final    BOTTLES DRAWN AEROBIC AND ANAEROBIC Blood Culture results may not be optimal due to an inadequate volume of blood received in culture bottles   Culture   Final    NO GROWTH 3 DAYS Performed at [facility], [address] [place], [place] [address]    Report Status  PENDING  Incomplete  Blood Culture (routine x 2)     Status: None (Preliminary result)   Collection Time: 12/17/18 12:22 PM  Result Value Ref Range Status   Specimen Description BLOOD RAC  Final   Special Requests   Final    BOTTLES DRAWN AEROBIC AND ANAEROBIC Blood Culture adequate volume   Culture   Final    NO GROWTH 3 DAYS Performed at [facility], [address], [place], [place] [address]    Report Status PENDING  Incomplete  SARS Coronavirus 2 [facility] order, Performed in [place] hospital lab)     Status: None   Collection Time: 12/17/18 12:22 PM  Result Value Ref Range Status   SARS Coronavirus 2 NEGATIVE NEGATIVE Final    Comment: (NOTE) If result is NEGATIVE SARS-CoV-2 target nucleic acids are NOT DETECTED. The SARS-CoV-2 RNA is generally detectable in upper and lower  respiratory specimens during the acute phase of infection. The lowest  concentration of SARS-CoV-2 viral copies this assay can detect is 250  copies / mL. A negative result does not preclude SARS-CoV-2 infection  and should not be used as the sole basis for treatment or other  patient management decisions.  A negative result may occur with  improper specimen collection / handling, submission of specimen other  than nasopharyngeal swab, presence of viral mutation(s) within the  areas targeted by this assay, and inadequate number of viral copies  (<250 copies / mL). A negative result must be combined with clinical  observations, patient history, and epidemiological information. If result is POSITIVE SARS-CoV-2 target nucleic acids are DETECTED. The SARS-CoV-2 RNA is generally detectable in upper and lower  respiratory specimens dur ing the acute phase of infection.  Positive  results are indicative of active infection with SARS-CoV-2.  Clinical  correlation with patient history and other diagnostic information is  necessary to determine patient infection status.  Positive results do  not  rule out bacterial infection or co-infection with other viruses. If result is PRESUMPTIVE POSTIVE SARS-CoV-2 nucleic acids MAY BE PRESENT.   A presumptive positive result was obtained on the submitted specimen  and confirmed on repeat testing.  While 2019 novel coronavirus  (SARS-CoV-2) nucleic acids may be present in the submitted sample  additional confirmatory testing may be necessary for epidemiological  and / or clinical management purposes  to differentiate between  SARS-CoV-2 and other Sarbecovirus currently known to infect humans.  If clinically indicated additional testing with an alternate test  methodology [phone]) is advised. The SARS-CoV-2 RNA is generally  detectable in upper and lower respiratory sp ecimens during the acute  phase of infection. The expected result is Negative. Fact Sheet  for Patients:  StrictlyIdeas.no Fact Sheet for Healthcare Providers: BankingDealers.co.za This test is not yet approved or cleared by the [place] FDA and has been authorized for detection and/or diagnosis of SARS-CoV-2 by FDA under an Emergency Use Authorization (EUA).  This EUA will remain in effect (meaning this test can be used) for the duration of the COVID-19 declaration under Section 564(b)(1) of the Act, 21 U.S.C. section 360bbb-3(b)(1), unless the authorization is terminated or revoked sooner. Performed at [facility], [address] [place], [place] [address]       Scheduled Meds: . arformoterol  15 mcg Nebulization Q12H  . aspirin  81 mg Oral Daily  . atorvastatin  40 mg Oral q1800  . budesonide  0.5 mg Nebulization BID  . carvedilol  12.5 mg Oral BID  . cholecalciferol  2,000 Units Oral QPM  . gabapentin  300 mg Oral QHS  . insulin aspart  0-15 Units Subcutaneous TID WC  . insulin aspart  0-5 Units Subcutaneous QHS  . insulin aspart  8 Units Subcutaneous TID WC  . [START ON 12/21/2018] insulin glargine   20 Units Subcutaneous Daily  . ipratropium-albuterol  3 mL Nebulization Q6H  . ivabradine  5 mg Oral BID WC  . multivitamin with minerals  1 tablet Oral Daily  . nystatin  5 mL Oral TID PC  . pantoprazole  40 mg Oral Daily  . [START ON 12/21/2018] predniSONE  30 mg Oral Q breakfast  . sodium chloride flush  3 mL Intravenous Q12H  . spironolactone  25 mg Oral Daily  . tiotropium  18 mcg Inhalation Daily  . tiZANidine  4 mg Oral QHS  . torsemide  40 mg Oral Daily  . torsemide  40 mg Oral Once  . vitamin C  500 mg Oral Daily   Continuous Infusions: . sodium chloride 500 mL (12/20/18 0627)  . ceFEPime (MAXIPIME) IV 2 g (12/20/18 1411)    Assessment/Plan:   1. Acute on chronic hypoxic respiratory failure.  Patient on 5 L of oxygen.  Patient desaturates with limited motion 2. COPD exacerbation, interstitial lung disease exacerbation.  Patient on prednisone and will start to taper down.  Likely will need a higher dose chronically.  Continue nebulizer treatments. 3. Repeated pneumonia.  On Maxipime 4. Acute kidney injury this has improved 5. Chronic combined systolic and diastolic congestive heart failure.  Continue aspirin, Coreg losartan torsemide and spironolactone 6. Hyponatremia.  Slightly better today 7. Lactic acidosis 8. Weakness- physical therapy evaluation. 9. History of CML.  White count very elevated with steroids.  Platelet count down.  Patient also anemic.  Code Status:     Code Status Orders  (From admission, onward)         Start     Ordered   12/17/18 1855  Full code  Continuous     12/17/18 1854        Code Status History    Date Active Date Inactive Code Status Order ID Comments User Context   11/23/2018 1540 11/28/2018 1641 DNR [id number]  Saundra Shelling, MD Inpatient   05/03/2018 1107 05/08/2018 1718 DNR [id number]  Nelva Bush, MD Inpatient   05/03/2018 0955 05/03/2018 1107 Full Code [id number]  Nelva Bush, MD Inpatient   05/02/2018 1609 05/03/2018  0955 DNR [id number]  Jimmy Footman, NP Inpatient   05/02/2018 1422 05/02/2018 1609 Full Code [id number]  Jimmy Footman, NP Inpatient   05/02/2018 1303 05/02/2018 1422 DNR [id number]  Jimmy Footman, NP Inpatient  05/01/2018 1254 05/02/2018 1303 Full Code [id number]  Nicholes Mango, MD Inpatient   03/24/2018 1948 03/28/2018 1340 Full Code [id number]  Saundra Shelling, MD Inpatient   03/03/2018 0328 03/06/2018 1716 Full Code [id number]  Harrie Foreman, MD Inpatient    Advance Directive Documentation     Most Recent Value  Type of Advance Directive  Healthcare Power of Attorney, Living will  Pre-existing out of facility DNR order (yellow form or pink MOST form)  -  "MOST" Form in Place?  -     Family Communication: spoke with daughter on the phone Disposition Plan: Reevaluate daily at this point on when to go home.  Patient's daughter states that they do have a handicap accessible room with lift chair and hospital bed.  Consultants:  Pulmonary  Antibiotics:  maxipime  Time spent: 28 minutes  [place]

## 2018-12-21 ENCOUNTER — Telehealth: Payer: Self-pay

## 2018-12-21 LAB — GLUCOSE, CAPILLARY: Glucose-Capillary: 231 mg/dL — ABNORMAL HIGH (ref 70–99)

## 2018-12-21 MED ORDER — TORSEMIDE 20 MG PO TABS: 40.0000 mg | ORAL_TABLET | Freq: Every day | ORAL | 3 refills | Status: DC

## 2018-12-21 MED ORDER — CEFDINIR 300 MG PO CAPS: 300.0000 mg | ORAL_CAPSULE | Freq: Two times a day (BID) | ORAL | 0 refills | Status: DC

## 2018-12-21 MED ORDER — CEFDINIR 300 MG PO CAPS
300.0000 mg | ORAL_CAPSULE | Freq: Two times a day (BID) | ORAL | Status: DC
  Administered 2018-12-21: 300 mg via ORAL
  Filled 2018-12-21 (×2): qty 1

## 2018-12-21 MED ORDER — PREDNISONE 5 MG PO TABS: ORAL_TABLET | ORAL | 0 refills | Status: DC

## 2018-12-21 MED ORDER — INSULIN ASPART 100 UNIT/ML ~~LOC~~ SOLN: SUBCUTANEOUS | 1 refills | Status: DC

## 2018-12-21 MED ORDER — INSULIN GLARGINE 100 UNIT/ML ~~LOC~~ SOLN: 20.0000 [IU] | Freq: Every day | SUBCUTANEOUS | 1 refills | Status: DC

## 2018-12-21 NOTE — Progress Notes (Signed)
Patient alert and oriented, vss, no complaints of pain.  D/c home with wife and daughter.  On chronic 02.  Escorted out of hospital via wheelchair by nursing staff

## 2018-12-21 NOTE — TOC Transition Note (Signed)
Transition of Care [facility]) - CM/SW Discharge Note   Patient Details  Name: Tony Watson MRN: [id number] Date of Birth: [birth year]  [facility] CM/SW Contact:  Elza Rafter, RN Phone Number: 12/21/2018, 10:50 AM   Clinical Narrative:   Patient is discharging to home today.  Notified Brad with [facility] and brad with [employer] for NIV delivery.  Will be delivered today.  Patient has refused out patient palliative.  No further needs identified at this time.      Final next level of care: Home w Home Health Services Barriers to Discharge: No Barriers Identified   Patient Goals and CMS Choice   CMS Medicare.gov Compare Post Acute Care list provided to:: Patient Choice offered to / list presented to : Patient  Discharge Placement                       Discharge Plan and Services   Discharge Planning Services: CM Consult Post Acute Care Choice: Home Health          DME Arranged: NIV DME Agency: [facility] Date DME Agency Contacted: 12/21/18 Time DME Agency Contacted: 0900 Representative spoke with at DME Agency: [place] HH Arranged: RN, PT [facility] Agency: [place] ([place]) Date [place]: 12/18/18 Time [place]: 71 Representative spoke with at [place]: [place] ([place]) Interventions     Readmission Risk Interventions Readmission Risk Prevention Plan 12/18/2018 11/28/2018 11/24/2018  Transportation Screening Complete Complete Complete  PCP or Specialist Appt within 3-5 Days - Complete -  HRI or [place] - Complete -  Medication Review Press photographer) Complete Complete Complete  PCP or Specialist appointment within 3-5 days of discharge - - Complete  HRI or Home Care Consult Complete - Complete  SW Recovery Care/Counseling Consult - - Patient refused  Palliative Care Screening - - Not Applicable  Medication Reconcilation ([place]  - - Not Applicable  Some recent data might be hidden

## 2018-12-21 NOTE — Telephone Encounter (Signed)
Discharged from [facility] with diagnoses as follows:  ILD (interstitial lung disease) ([place]) Sepsis ([place]) COPD with acute bronchitis ([place]) Acute on chronic respiratory failure ([place])  Per discharge summary:    Follow-Ups    1 Follow up with Owens Loffler, MD (Family Medicine) on 12/26/2018; virual appointment at 10:20am  2 Follow up with Minna Merritts, MD (Cardiology) on 12/29/2018; virtual appointment at 2pm  3 Follow up with [name], MD (Pulmonary Disease) on 01/09/2019; appointment at [facility] Follow-up Telephone Call    Date discharged? 12/21/2018  How have you been since you were released from the hospital? [place].   Any patient concerns? Generalized weakness   Items Reviewed:  Medications reviewed: Yes  Allergies reviewed: Yes  Dietary changes reviewed: No  Referrals reviewed: Yes, cardiology and pulmonary   Functional Questionnaire:  Independent - I Dependent - D    Activities of Daily Living (ADLs):    Personal hygiene - I Dressing - I Eating - I Maintaining continence - I Transferring - D ambulates with cane and walker   Independent Activities of Daily Living (iADLs): Basic communication skills - I Transportation - I (spouse does most of driving) Meal preparation  - D spouse prepares meals Shopping - D spouse takes care of shopping Housework - D spouse takes care of housekeeping Managing medications - I  Managing personal finances - D spouse takes care of finances   Confirmed importance and date/time of follow-up visits scheduled YES  Provider Appointment booked with PCP 12/26/18 @ 1020  Confirmed with patient if condition begins to worsen call PCP or go to the ER.  Patient was given the office number and encouraged to call back with question or concerns: YES

## 2018-12-21 NOTE — Discharge Summary (Addendum)
[place] [facility] [place] NAME: Tony Watson    MR#:  [id number]  DATE OF BIRTH:  10-13-1950  DATE OF ADMISSION:  12/17/2018 ADMITTING PHYSICIAN: Tony Basta, MD  DATE OF DISCHARGE: 12/21/2018 12:05 PM  PRIMARY CARE PHYSICIAN: Tony Loffler, MD    ADMISSION DIAGNOSIS:  COPD exacerbation ([place]) [J44.1] HCAP (healthcare-associated pneumonia) [J18.9] Sepsis, due to unspecified organism, unspecified whether acute organ dysfunction present ([place]) [A41.9] Acute on chronic respiratory failure ([place]) [J96.20]  DISCHARGE DIAGNOSIS:  Active Problems:   ILD (interstitial lung disease) ([place])   Sepsis ([place])   COPD with acute bronchitis ([place])   Acute on chronic respiratory failure ([place])   SECONDARY DIAGNOSIS:   Past Medical History:  Diagnosis Date  . Arthritis   . CAD (coronary artery disease)    a. inf MI 11/99 (in [place]) w/ PCI to [facility]; b. [place] 2004 [facility]): EF 50%, patent RCA stent, no obs dz; c. [place] 2008: EF 42%, inf infarct, no ischemia; d. R/LHC 9/19: 3-v dz w/ mild dif dz LM, m-dLAD 50, dLAD 60, D2 80, RI 80, mRCA-1 90, mRCA-2 20, patent RCA stent, CO/CI 3.7/2  . Chronic combined systolic (congestive) and diastolic (congestive) heart failure ([place])    a. 7/29019 Echo: EF 25-30%, sev diff HK, mild MR mod dil LA, mildly reduced RVSF, elevated CVP  . Chronic respiratory failure with hypoxia (HCC)    a. on 2L supplemental oxygen via [place] followed by pulmonology  . CML (chronic myelocytic leukemia) ([place])   . COPD (chronic obstructive pulmonary disease) ([place])   . Diabetes mellitus without complication ([place])   . Diabetic autonomic neuropathy associated with type 2 diabetes mellitus ([place]) 03/27/2015  . Diverticulosis 2002  . GERD (gastroesophageal reflux disease)   . HTN (hypertension)   . Hyperlipidemia   . ILD (interstitial lung disease) ([place])   . Ischemic cardiomyopathy   . Medical non-compliance   . Smoker   . Spinal stenosis   . TIA  (transient ischemic attack)    a. s/p PFO closure 2004 (in [place])  . Ulcer     HOSPITAL COURSE:   1.  Acute on chronic hypoxic respiratory failure.  The patient is on chronic 5 L of oxygen.  Patient was initially admitted to the stepdown unit for BiPAP and high flow nasal cannula.  The patient desaturates with limited motion.  I spoke with advanced and they are setting up a trilogy machine for him at home.  He can wear the trilogy machine anytime he short of breath and at night.  In speaking with the patient's daughter, she has a hospital bed at home, [facility] lift, wheelchair and other equipment.  She is prepared for him as he declines over time.  Overall prognosis is poor.  Patient is a DO NOT RESUSCITATE.  He declined palliative care consultation during the hospital stay. 2.  COPD exacerbation with interstitial lung disease.  Patient does take chronic prednisone.  He will be tapered down from 30 mg daily down to 15 mg standing dose.  Patient has all his nebulizers at home.  Follow-up with pulmonary as outpatient. 3.  Repeated pneumonia was on Maxipime here.  We will give Omnicef upon discharge home. 4.  Acute kidney injury on admission.  Creatinine was 1.69.  This has improved to 0.99 upon discharge. 5.  Chronic combined systolic and diastolic congestive heart failure.  Continue aspirin, Coreg losartan, torsemide and spironolactone. 6.  Hyponatremia. This has improved back to normal range.  7.  Lactic acidosis.  This has resolved 8.  Weakness.  Will set up home health. 9.  History of CML.  White count very elevated with chronic steroids.  Platelet count down and patient also anemic. 10.  Thrush has nystatin swish and swallow at home. 11. Type 2 diabetes.  Patient prescribed Lantus and short acting insulin prior to meals since patient is on chronic steroids and sugars elevated.  DISCHARGE CONDITIONS:   Fair  CONSULTS OBTAINED:  Pulmonary  DRUG ALLERGIES:   Allergies  Allergen Reactions  .  Prilosec [Omeprazole] Diarrhea    DISCHARGE MEDICATIONS:   Allergies as of 12/21/2018      Reactions   Prilosec [omeprazole] Diarrhea      Medication List    STOP taking these medications   diltiazem 120 MG 24 hr capsule Commonly known as:  CARDIZEM CD     TAKE these medications   acetaminophen 500 MG tablet Commonly known as:  TYLENOL Take 1,000 mg by mouth daily as needed for moderate pain or headache.   aspirin 81 MG chewable tablet Chew 1 tablet (81 mg total) by mouth daily.   atorvastatin 40 MG tablet Commonly known as:  LIPITOR TAKE 1 TABLET (40 MG TOTAL) BY MOUTH DAILY AT 6 PM.   benzonatate 200 MG capsule Commonly known as:  TESSALON Take 1 capsule (200 mg total) by mouth 3 (three) times daily.   blood glucose meter kit and supplies Dispense based on patient and insurance preference. Use to check blood sugar daily.   budesonide 0.5 MG/2ML nebulizer solution Commonly known as:  PULMICORT Take 2 mLs (0.5 mg total) by nebulization 2 (two) times daily.   carvedilol 12.5 MG tablet Commonly known as:  COREG Take 1 tablet (12.5 mg total) by mouth 2 (two) times daily.   cefdinir 300 MG capsule Commonly known as:  OMNICEF Take 1 capsule (300 mg total) by mouth every 12 (twelve) hours.   cetirizine 10 MG chewable tablet Commonly known as:  ZYRTEC Chew 10 mg by mouth daily.   formoterol 20 MCG/2ML nebulizer solution Commonly known as:  PERFOROMIST Take 2 mLs (20 mcg total) by nebulization 2 (two) times daily. J44.9   gabapentin 300 MG capsule Commonly known as:  NEURONTIN Take 1 capsule (300 mg total) by mouth at bedtime.   guaiFENesin-codeine 100-10 MG/5ML syrup Take 10 mLs by mouth every 4 (four) hours as needed for cough.   insulin aspart 100 UNIT/ML injection Commonly known as:  novoLOG 5 units subcutaneous prior to meals (okay to substitute generic or what is covered by [employer])   insulin glargine 100 UNIT/ML injection Commonly known as:   LANTUS Inject 0.2 mLs (20 Units total) into the skin daily. Start taking on:  Dec 22, 2018   ipratropium-albuterol 0.5-2.5 (3) MG/3ML Soln Commonly known as:  DUONEB Take 3 mLs by nebulization every 4 (four) hours as needed. DX:J44.9 What changed:    reasons to take this  additional instructions   ivabradine 5 MG Tabs tablet Commonly known as:  Corlanor Take 1 tablet (5 mg total) by mouth 2 (two) times daily with a meal.   LORazepam 0.5 MG tablet Commonly known as:  ATIVAN Take 1 tablet (0.5 mg total) by mouth 3 (three) times daily as needed for anxiety.   losartan 25 MG tablet Commonly known as:  COZAAR Take 0.5 tablets (12.5 mg total) by mouth daily.   multivitamin with minerals Tabs tablet Take 1 tablet by mouth daily.   nystatin 100000  UNIT/ML suspension Commonly known as:  MYCOSTATIN TAKE AS DIRECTED 5 MLS (500,000 UNITS TOTAL) IN THE MOUTH OR THROAT 4 TIMES DAILY What changed:  See the new instructions.   omeprazole 20 MG capsule Commonly known as:  PRILOSEC Take 20 mg by mouth daily.   ondansetron 4 MG disintegrating tablet Commonly known as:  Zofran ODT Take 1-2 tablets (4-8 mg total) by mouth every 6 (six) hours as needed for nausea or vomiting.   predniSONE 5 MG tablet Commonly known as:  DELTASONE 6 tabs po daily for 3 days; 5 tabs po daily for three days; 4 tabs po daily for three days; 3 tabs po daily afterwards What changed:    medication strength  how much to take  how to take this  when to take this  additional instructions  Another medication with the same name was removed. Continue taking this medication, and follow the directions you see here.   spironolactone 25 MG tablet Commonly known as:  ALDACTONE TAKE 1 TABLET BY MOUTH EVERY DAY   tiZANidine 4 MG tablet Commonly known as:  ZANAFLEX Take 1 tablet (4 mg total) by mouth Nightly.   torsemide 20 MG tablet Commonly known as:  DEMADEX Take 2 tablets (40 mg total) by mouth  daily. What changed:  how much to take   vitamin C 500 MG tablet Commonly known as:  ASCORBIC ACID Take 500 mg by mouth daily.   Vitamin D 50 MCG (2000 UT) Caps Take 2,000 Units by mouth every evening.        DISCHARGE INSTRUCTIONS:  Follow-up PMD 5 days Follow-up pulmonary as outpatient  If you experience worsening of your admission symptoms, develop shortness of breath, life threatening emergency, suicidal or homicidal thoughts you must seek medical attention immediately by calling 911 or calling your MD immediately  if symptoms less severe.  You Must read complete instructions/literature along with all the possible adverse reactions/side effects for all the Medicines you take and that have been prescribed to you. Take any new Medicines after you have completely understood and accept all the possible adverse reactions/side effects.   Please note  You were cared for by a hospitalist during your hospital stay. If you have any questions about your discharge medications or the care you received while you were in the hospital after you are discharged, you can call the unit and asked to speak with the hospitalist on call if the hospitalist that took care of you is not available. Once you are discharged, your primary care physician will handle any further medical issues. Please note that NO REFILLS for any discharge medications will be authorized once you are discharged, as it is imperative that you return to your primary care physician (or establish a relationship with a primary care physician if you do not have one) for your aftercare needs so that they can reassess your need for medications and monitor your lab values.    Today   CHIEF COMPLAINT:   Chief Complaint  Patient presents with  . Cough  . Shortness of Breath    HISTORY OF PRESENT ILLNESS:  Tony Watson  is a 68 y.o. male presented with shortness of breath and cough   VITAL SIGNS:  Blood pressure 140/69, pulse 67,  temperature 97.8 F (36.6 C), temperature source Oral, resp. rate 19, height '5\' 11"'$  (1.803 m), weight 86.5 kg, SpO2 97 %.    PHYSICAL EXAMINATION:  GENERAL:  68 y.o.-year-old patient lying in the bed with no acute  distress.  EYES: Pupils equal, round, reactive to light and accommodation. No scleral icterus. Extraocular muscles intact.  HEENT: Head atraumatic, normocephalic. Oropharynx and nasopharynx clear.  NECK:  Supple, no jugular venous distention. No thyroid enlargement, no tenderness.  LUNGS: After the patient coughs, decreased breath sounds bilaterally with scattered rhonchi. No use of accessory muscles of respiration.  CARDIOVASCULAR: S1, S2 normal. No murmurs, rubs, or gallops.  ABDOMEN: Soft, non-tender, non-distended. Bowel sounds present. No organomegaly or mass.  EXTREMITIES: No pedal edema, cyanosis, or clubbing.  NEUROLOGIC: Cranial nerves II through XII are intact. Muscle strength 5/5 in all extremities. Sensation intact. Gait not checked.  PSYCHIATRIC: The patient is alert and oriented x 3.  SKIN: No obvious rash, lesion, or ulcer.   DATA REVIEW:   CBC Recent Labs  Lab 12/20/18 0255  WBC 47.8*  HGB 9.6*  HCT 29.4*  PLT 44*    Chemistries  Recent Labs  Lab 12/17/18 1420 12/18/18 0423 12/20/18 0255  NA 129* 131* 136  K 4.2 3.8 4.1  CL 90* 94* 95*  CO2 '29 25 30  '$ GLUCOSE 249* 338* 340*  BUN 32* 37* 38*  CREATININE 1.69* 1.21 0.99  CALCIUM 7.7* 7.8* 8.4*  MG  --  1.4*  --   AST 34  --   --   ALT 47*  --   --   ALKPHOS 46  --   --   BILITOT 1.2  --   --      Microbiology Results  Results for orders placed or performed during the hospital encounter of 12/17/18  Blood Culture (routine x 2)     Status: None (Preliminary result)   Collection Time: 12/17/18 12:15 PM  Result Value Ref Range Status   Specimen Description BLOOD BLOOD LEFT FOREARM  Final   Special Requests   Final    BOTTLES DRAWN AEROBIC AND ANAEROBIC Blood Culture results may not be  optimal due to an inadequate volume of blood received in culture bottles   Culture   Final    NO GROWTH 4 DAYS Performed at [facility], [address] [place], [place] [address]    Report Status PENDING  Incomplete  Blood Culture (routine x 2)     Status: None (Preliminary result)   Collection Time: 12/17/18 12:22 PM  Result Value Ref Range Status   Specimen Description BLOOD RAC  Final   Special Requests   Final    BOTTLES DRAWN AEROBIC AND ANAEROBIC Blood Culture adequate volume   Culture   Final    NO GROWTH 4 DAYS Performed at [facility], [address] [place], [place] [address]    Report Status PENDING  Incomplete  SARS Coronavirus 2 [facility] order, Performed in [place] hospital lab)     Status: None   Collection Time: 12/17/18 12:22 PM  Result Value Ref Range Status   SARS Coronavirus 2 NEGATIVE NEGATIVE Final    Comment: (NOTE) If result is NEGATIVE SARS-CoV-2 target nucleic acids are NOT DETECTED. The SARS-CoV-2 RNA is generally detectable in upper and lower  respiratory specimens during the acute phase of infection. The lowest  concentration of SARS-CoV-2 viral copies this assay can detect is 250  copies / mL. A negative result does not preclude SARS-CoV-2 infection  and should not be used as the sole basis for treatment or other  patient management decisions.  A negative result may occur with  improper specimen collection / handling, submission of specimen other  than nasopharyngeal  swab, presence of viral mutation(s) within the  areas targeted by this assay, and inadequate number of viral copies  (<250 copies / mL). A negative result must be combined with clinical  observations, patient history, and epidemiological information. If result is POSITIVE SARS-CoV-2 target nucleic acids are DETECTED. The SARS-CoV-2 RNA is generally detectable in upper and lower  respiratory specimens dur ing the acute phase of infection.  Positive  results  are indicative of active infection with SARS-CoV-2.  Clinical  correlation with patient history and other diagnostic information is  necessary to determine patient infection status.  Positive results do  not rule out bacterial infection or co-infection with other viruses. If result is PRESUMPTIVE POSTIVE SARS-CoV-2 nucleic acids MAY BE PRESENT.   A presumptive positive result was obtained on the submitted specimen  and confirmed on repeat testing.  While 2019 novel coronavirus  (SARS-CoV-2) nucleic acids may be present in the submitted sample  additional confirmatory testing may be necessary for epidemiological  and / or clinical management purposes  to differentiate between  SARS-CoV-2 and other Sarbecovirus currently known to infect humans.  If clinically indicated additional testing with an alternate test  methodology [phone]) is advised. The SARS-CoV-2 RNA is generally  detectable in upper and lower respiratory sp ecimens during the acute  phase of infection. The expected result is Negative. Fact Sheet for Patients:  StrictlyIdeas.no Fact Sheet for Healthcare Providers: BankingDealers.co.za This test is not yet approved or cleared by the [place] FDA and has been authorized for detection and/or diagnosis of SARS-CoV-2 by FDA under an Emergency Use Authorization (EUA).  This EUA will remain in effect (meaning this test can be used) for the duration of the COVID-19 declaration under Section 564(b)(1) of the Act, 21 U.S.C. section 360bbb-3(b)(1), unless the authorization is terminated or revoked sooner. Performed at [facility], [address] [place], [place] [address]       Management plans discussed with the patient, family and they are in agreement.  CODE STATUS:     Code Status Orders  (From admission, onward)         Start     Ordered   12/20/18 1025  Do not attempt resuscitation (DNR)  Continuous     Question Answer Comment  In the event of cardiac or respiratory ARREST Do not call a "code blue"   In the event of cardiac or respiratory ARREST Do not perform Intubation, CPR, defibrillation or ACLS   In the event of cardiac or respiratory ARREST Use medication by any route, position, wound care, and other measures to relive pain and suffering. May use oxygen, suction and manual treatment of airway obstruction as needed for comfort.      12/20/18 1024        Code Status History    Date Active Date Inactive Code Status Order ID Comments User Context   12/17/2018 1854 12/20/2018 1024 Full Code [id number]  Tony Basta, MD Inpatient   11/23/2018 1540 11/28/2018 1641 DNR [id number]  Saundra Shelling, MD Inpatient   05/03/2018 1107 05/08/2018 1718 DNR [id number]  Nelva Bush, MD Inpatient   05/03/2018 0955 05/03/2018 1107 Full Code [id number]  Nelva Bush, MD Inpatient   05/02/2018 1609 05/03/2018 0955 DNR [id number]  Jimmy Footman, NP Inpatient   05/02/2018 1422 05/02/2018 1609 Full Code [id number]  Jimmy Footman, NP Inpatient   05/02/2018 1303 05/02/2018 1422 DNR [id number]  Jimmy Footman, NP Inpatient   05/01/2018 1254 05/02/2018 1303 Full Code [id number]  Nicholes Mango, MD Inpatient   03/24/2018 1948 03/28/2018 1340 Full Code [id number]  Saundra Shelling, MD Inpatient   03/03/2018 0328 03/06/2018 1716 Full Code [id number]  Harrie Foreman, MD Inpatient    Advance Directive Documentation     Most Recent Value  Type of Advance Directive  Healthcare Power of [place], Living will  Pre-existing out of facility DNR order (yellow form or pink MOST form)  -  "MOST" Form in Place?  -      TOTAL TIME TAKING CARE OF THIS PATIENT: 35 minutes.    Loletha Grayer M.D on 12/21/2018 at 1:47 PM  Between 7am to 6pm - Pager - [phone]  After 6pm go to [email or web address] - password [email or web address]  [facility] Office  [phone]  CC: Primary care physician;  Tony Loffler, MD

## 2018-12-22 DIAGNOSIS — E1143 Type 2 diabetes mellitus with diabetic autonomic (poly)neuropathy: Secondary | ICD-10-CM | POA: Diagnosis not present

## 2018-12-22 DIAGNOSIS — J189 Pneumonia, unspecified organism: Secondary | ICD-10-CM | POA: Diagnosis not present

## 2018-12-22 DIAGNOSIS — J441 Chronic obstructive pulmonary disease with (acute) exacerbation: Secondary | ICD-10-CM | POA: Diagnosis not present

## 2018-12-22 DIAGNOSIS — J440 Chronic obstructive pulmonary disease with acute lower respiratory infection: Secondary | ICD-10-CM | POA: Diagnosis not present

## 2018-12-22 DIAGNOSIS — I110 Hypertensive heart disease with heart failure: Secondary | ICD-10-CM | POA: Diagnosis not present

## 2018-12-22 DIAGNOSIS — I2510 Atherosclerotic heart disease of native coronary artery without angina pectoris: Secondary | ICD-10-CM | POA: Diagnosis not present

## 2018-12-22 DIAGNOSIS — J209 Acute bronchitis, unspecified: Secondary | ICD-10-CM | POA: Diagnosis not present

## 2018-12-22 DIAGNOSIS — J9621 Acute and chronic respiratory failure with hypoxia: Secondary | ICD-10-CM | POA: Diagnosis not present

## 2018-12-22 DIAGNOSIS — I5042 Chronic combined systolic (congestive) and diastolic (congestive) heart failure: Secondary | ICD-10-CM | POA: Diagnosis not present

## 2018-12-22 LAB — BLOOD GAS, VENOUS
Acid-base deficit: 2.2 mmol/L — ABNORMAL HIGH (ref 0.0–2.0)
Bicarbonate: 24.2 mmol/L (ref 20.0–28.0)
O2 Saturation: 51.1 %
Patient temperature: 37
pCO2, Ven: 47 mmHg (ref 44.0–60.0)
pH, Ven: 7.32 (ref 7.250–7.430)

## 2018-12-22 LAB — CULTURE, BLOOD (ROUTINE X 2)
Culture: NO GROWTH
Culture: NO GROWTH
Special Requests: ADEQUATE

## 2018-12-23 ENCOUNTER — Telehealth: Payer: Self-pay

## 2018-12-23 NOTE — Telephone Encounter (Signed)
Mr. Rybacki notified as instructed by telephone.  He wrote down and verbally repeated all instructions back to me.  He is also requesting refill on guaifenesin-codeine 100-10 mg/52ml & benzonatate 200 mg.  Both meds were last refilled 11/28/2018.  Ok to refill?

## 2018-12-23 NOTE — Telephone Encounter (Signed)
1. He needs to eat a low carb diet and increase water intake 2.Increase Lantus to 25 units daily  3. Increase Novolog to 8 Units with each meal 3. Have pt call with Fasting blood sugars and 2 hour post prandials on Monday 4 If blood sugar persistently > 350 over the weekend.Marland Kitchen go to ER.

## 2018-12-23 NOTE — Telephone Encounter (Signed)
Yes, agree with Bob's HH orders

## 2018-12-23 NOTE — Telephone Encounter (Addendum)
Mrs Kozikowski [facility] signed) said pts BS last night was 540. pts wife called [facility] and was advised to give pt and extra 12 units of Novolg insulin. Pt is taking prednisone also. After additional insulin last night 11:55PM was 498; rechecked at 1:05 Am and BS was 452. No symptoms with high BS. FBS this AM was 264; pt was given 20 units of lantus this AM and Novolog 5 units at breakfast; 10:48 AM BS was 268; pt ate lunch and pt given Novolog 5 units and at 1:30 BS was 348. pt ate 2 hawaiain rolls with deli ham with butter for lunch.Pt taking Novolog 5 units before each meal and Lantus taking 20 units in skin daily in morning time. Pt's wife request cb with what to do about adjusting pt insulin; I called and spoke with Mrs Glazier and pt still does not feel bad. Please advise.

## 2018-12-23 NOTE — Telephone Encounter (Signed)
Left message for Tony Watson with [facility] left giving verbal orders for [facility] nursing for COPD,Pneumonia and CHF 2 x a week for 2 weeks then 1 x a week for 6 weeks per Dr. Lorelei Pont.

## 2018-12-23 NOTE — Telephone Encounter (Signed)
Abby nurse with [facility] left v/m requesting verbal orders for [facility] nursing for COPD,pneumonia and CHF  2 x a wk for 2 wks and 1 x a wk for 6 wks.

## 2018-12-23 NOTE — Telephone Encounter (Signed)
Controlled substance needs to await PCP

## 2018-12-26 ENCOUNTER — Encounter: Payer: Self-pay | Admitting: Family Medicine

## 2018-12-26 ENCOUNTER — Ambulatory Visit (INDEPENDENT_AMBULATORY_CARE_PROVIDER_SITE_OTHER): Payer: Medicare HMO | Admitting: Family Medicine

## 2018-12-26 VITALS — BP 115/72 | HR 86 | Temp 96.8°F | Ht 70.0 in | Wt 185.2 lb

## 2018-12-26 DIAGNOSIS — J849 Interstitial pulmonary disease, unspecified: Secondary | ICD-10-CM | POA: Diagnosis not present

## 2018-12-26 DIAGNOSIS — C9310 Chronic myelomonocytic leukemia not having achieved remission: Secondary | ICD-10-CM

## 2018-12-26 DIAGNOSIS — I110 Hypertensive heart disease with heart failure: Secondary | ICD-10-CM | POA: Diagnosis not present

## 2018-12-26 DIAGNOSIS — A419 Sepsis, unspecified organism: Secondary | ICD-10-CM

## 2018-12-26 DIAGNOSIS — E1159 Type 2 diabetes mellitus with other circulatory complications: Secondary | ICD-10-CM | POA: Diagnosis not present

## 2018-12-26 DIAGNOSIS — I5022 Chronic systolic (congestive) heart failure: Secondary | ICD-10-CM

## 2018-12-26 DIAGNOSIS — J209 Acute bronchitis, unspecified: Secondary | ICD-10-CM | POA: Diagnosis not present

## 2018-12-26 DIAGNOSIS — I5042 Chronic combined systolic (congestive) and diastolic (congestive) heart failure: Secondary | ICD-10-CM | POA: Diagnosis not present

## 2018-12-26 DIAGNOSIS — E43 Unspecified severe protein-calorie malnutrition: Secondary | ICD-10-CM

## 2018-12-26 DIAGNOSIS — E1143 Type 2 diabetes mellitus with diabetic autonomic (poly)neuropathy: Secondary | ICD-10-CM | POA: Diagnosis not present

## 2018-12-26 DIAGNOSIS — J449 Chronic obstructive pulmonary disease, unspecified: Secondary | ICD-10-CM

## 2018-12-26 DIAGNOSIS — J189 Pneumonia, unspecified organism: Secondary | ICD-10-CM | POA: Diagnosis not present

## 2018-12-26 DIAGNOSIS — Z7189 Other specified counseling: Secondary | ICD-10-CM

## 2018-12-26 DIAGNOSIS — J441 Chronic obstructive pulmonary disease with (acute) exacerbation: Secondary | ICD-10-CM | POA: Diagnosis not present

## 2018-12-26 DIAGNOSIS — I2510 Atherosclerotic heart disease of native coronary artery without angina pectoris: Secondary | ICD-10-CM | POA: Diagnosis not present

## 2018-12-26 DIAGNOSIS — J9621 Acute and chronic respiratory failure with hypoxia: Secondary | ICD-10-CM

## 2018-12-26 DIAGNOSIS — J440 Chronic obstructive pulmonary disease with acute lower respiratory infection: Secondary | ICD-10-CM | POA: Diagnosis not present

## 2018-12-26 MED ORDER — GUAIFENESIN-CODEINE 100-10 MG/5ML PO SOLN: 5.0000 mL | Freq: Every evening | ORAL | 0 refills | Status: DC | PRN

## 2018-12-26 MED ORDER — BENZONATATE 200 MG PO CAPS: 200.0000 mg | ORAL_CAPSULE | Freq: Three times a day (TID) | ORAL | 3 refills | Status: DC | PRN

## 2018-12-26 NOTE — Telephone Encounter (Signed)
This is from high dose steroids in hospital.  Will speak with them today

## 2018-12-26 NOTE — Telephone Encounter (Signed)
I reviewed with [name] on the phone

## 2018-12-26 NOTE — Progress Notes (Signed)
Artice Holohan T. Rasheedah Reis, MD Primary Care and [place] at [facility] [place], [address] Phone: [phone]  FAX: [phone]  CHRISTOS MIXSON - 68 y.o. male  MRN [id number]  Date of Birth: 03/27/1951  Visit Date: 12/26/2018  PCP: Owens Loffler, MD  Referred by: Owens Loffler, MD Chief Complaint  Patient presents with  . Hospitalization Follow-up   Virtual Visit via Video Note:  I connected with  Tony Watson on 12/26/2018 10:20 AM EDT by a video enabled telemedicine application and verified that I am speaking with the correct person using two identifiers.   Location patient: home computer, tablet, or smartphone Location provider: work or home office Consent: Verbal consent directly obtained from Tony Watson. Persons participating in the virtual visit: patient, provider  I discussed the limitations of evaluation and management by telemedicine and the availability of in person appointments. The patient expressed understanding and agreed to proceed.  History of Present Illness:  TCM 7:  DATE OF ADMISSION:  12/17/2018      ADMITTING PHYSICIAN: Vaughan Basta, MD DATE OF DISCHARGE: 12/21/2018 12:05 PM  [name] is globally doing poorly.  He was admitted with acute on chronic respiratory failure with pneumonia and ultimately sepsis.  He required high flow oxygen with BiPAP on admission.  He has been given him a trilogy machine at home.  He does have interstitial lung disease and takes chronic steroids.  For his pneumonia, he was on Maxipime in the hospital and discharged on Omnicef.  He did have some acute renal insufficiency while in the hospital with a creatinine at 1.7 which normalized to 1 on discharge.  Very healthy elevated blood sugars in the hospital, and he received high-dose steroids.  Called in on Friday with some blood sugars about 500, and my partner increased his insulin dosing.  She increases  the Lantus 25 units and 8 units of NovoLog with meals.  COPD Exac PNA Sepsis Acute on chronic Resp failure  ILD  Palliative Care  Change in insulin - but check BS for now  1. He needs to eat a low carb diet and increase water intake 2.Increase Lantus to 25 units daily  3. Increase Novolog to 8 Units with each meal 3. Have pt call with Fasting blood sugars and 2 hour post prandials on Monday  Review of Systems as above: See pertinent positives and pertinent negatives per HPI No acute distress verbally  Past Medical History, Surgical History, Social History, Family History, Problem List, Medications, and Allergies have been reviewed and updated if relevant.   Observations/Objective/Exam:  An attempt was made to discern vital signs over the phone and per patient if applicable and possible.   General:    Alert, Oriented, appears well and in no acute distress HEENT:     Atraumatic, conjunctiva clear, no obvious abnormalities on inspection of external nose and ears.  Neck:    Normal movements of the head and neck Pulmonary:     On inspection no signs of respiratory distress, breathing rate appears normal, no obvious gross SOB, gasping or wheezing Cardiovascular:    No obvious cyanosis Musculoskeletal:    Moves all visible extremities without noticeable abnormality Psych / Neurological:     Pleasant and cooperative, no obvious depression or anxiety, speech and thought processing grossly intact  Assessment and Plan:  Acute on chronic respiratory failure with hypoxia (HCC)  Sepsis due to pneumonia (HCC)  Protein-calorie malnutrition, severe  Stage  4 very severe COPD by GOLD classification ([place])  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   Acute respiratory failure with chronic respiratory failure, sepsis, pneumonia.  Cachectic.  Malnutrition.  Leukemia.  Interstitial lung disease.  Now palliative care is involved, and they came on Monday.  He does have a trilogy machine at home now, and baseline prednisone was increased to 15 mg a day.  With regards to his blood sugar, suspect that his very high blood sugars related to high doses of steroids in the hospital.  Blood sugars have normalized to about 200 in the morning now.  Of asked him and his wife to check more frequently this week, and if they get notably lower, to resume his prior insulin dosing.  In this chronically ill patient, my goal blood sugar would be about 200.  DNR/DNI  I discussed the assessment and treatment plan with the patient. The patient was provided an opportunity to ask questions and all were answered. The patient agreed with the plan and demonstrated an understanding of the instructions.   The patient was advised to call back or seek an in-person evaluation if the symptoms worsen or if the condition fails to improve as anticipated.  Follow-up: prn unless noted otherwise below No follow-ups on file.  Meds ordered this encounter  Medications  . benzonatate (TESSALON) 200 MG capsule    Sig: Take 1 capsule (200 mg total) by mouth 3 (three) times daily as needed for cough.    Dispense:  50 capsule    Refill:  3  . guaiFENesin-codeine 100-10 MG/5ML syrup    Sig: Take 5 mLs by mouth at bedtime as needed for cough.    Dispense:  180 mL    Refill:  0   No orders of the defined types were placed in this encounter.   Signed,  Maud Deed. Price L[place]elle, MD

## 2018-12-27 ENCOUNTER — Telehealth: Payer: Self-pay | Admitting: *Deleted

## 2018-12-27 DIAGNOSIS — J440 Chronic obstructive pulmonary disease with acute lower respiratory infection: Secondary | ICD-10-CM | POA: Diagnosis not present

## 2018-12-27 DIAGNOSIS — J189 Pneumonia, unspecified organism: Secondary | ICD-10-CM | POA: Diagnosis not present

## 2018-12-27 DIAGNOSIS — I2510 Atherosclerotic heart disease of native coronary artery without angina pectoris: Secondary | ICD-10-CM | POA: Diagnosis not present

## 2018-12-27 DIAGNOSIS — I509 Heart failure, unspecified: Secondary | ICD-10-CM | POA: Diagnosis not present

## 2018-12-27 DIAGNOSIS — J9621 Acute and chronic respiratory failure with hypoxia: Secondary | ICD-10-CM | POA: Diagnosis not present

## 2018-12-27 DIAGNOSIS — I5042 Chronic combined systolic (congestive) and diastolic (congestive) heart failure: Secondary | ICD-10-CM | POA: Diagnosis not present

## 2018-12-27 DIAGNOSIS — E1143 Type 2 diabetes mellitus with diabetic autonomic (poly)neuropathy: Secondary | ICD-10-CM | POA: Diagnosis not present

## 2018-12-27 DIAGNOSIS — I110 Hypertensive heart disease with heart failure: Secondary | ICD-10-CM | POA: Diagnosis not present

## 2018-12-27 DIAGNOSIS — Z7951 Long term (current) use of inhaled steroids: Secondary | ICD-10-CM | POA: Diagnosis not present

## 2018-12-27 DIAGNOSIS — J209 Acute bronchitis, unspecified: Secondary | ICD-10-CM | POA: Diagnosis not present

## 2018-12-27 DIAGNOSIS — J449 Chronic obstructive pulmonary disease, unspecified: Secondary | ICD-10-CM | POA: Diagnosis not present

## 2018-12-27 DIAGNOSIS — J441 Chronic obstructive pulmonary disease with (acute) exacerbation: Secondary | ICD-10-CM | POA: Diagnosis not present

## 2018-12-27 DIAGNOSIS — J9610 Chronic respiratory failure, unspecified whether with hypoxia or hypercapnia: Secondary | ICD-10-CM | POA: Diagnosis not present

## 2018-12-27 DIAGNOSIS — J849 Interstitial pulmonary disease, unspecified: Secondary | ICD-10-CM | POA: Diagnosis not present

## 2018-12-27 DIAGNOSIS — R1310 Dysphagia, unspecified: Secondary | ICD-10-CM | POA: Diagnosis not present

## 2018-12-27 DIAGNOSIS — E1165 Type 2 diabetes mellitus with hyperglycemia: Secondary | ICD-10-CM | POA: Diagnosis not present

## 2018-12-27 DIAGNOSIS — Z7189 Other specified counseling: Secondary | ICD-10-CM | POA: Diagnosis not present

## 2018-12-27 DIAGNOSIS — E261 Secondary hyperaldosteronism: Secondary | ICD-10-CM | POA: Diagnosis not present

## 2018-12-27 DIAGNOSIS — Z794 Long term (current) use of insulin: Secondary | ICD-10-CM | POA: Diagnosis not present

## 2018-12-27 NOTE — Progress Notes (Signed)
Virtual Visit via Video Note   This visit type was conducted due to national recommendations for restrictions regarding the COVID-19 Pandemic (e.g. social distancing) in an effort to limit this patient's exposure and mitigate transmission in our community.  Due to his co-morbid illnesses, this patient is at least at moderate risk for complications without adequate follow up.  This format is felt to be most appropriate for this patient at this time.  All issues noted in this document were discussed and addressed.  A limited physical exam was performed with this format.  Please refer to the patient's chart for his consent to telehealth for [facility].   I connected with  Tony Watson on 12/29/18 by a video enabled telemedicine application and verified that I am speaking with the correct person using two identifiers. I discussed the limitations of evaluation and management by telemedicine. The patient expressed understanding and agreed to proceed.   Evaluation Performed:  Follow-up visit  Date:  12/29/2018   ID:  Tony Watson, DOB 1951-07-28, MRN [id number]  Patient Location:  [address] [place] [address]   Provider location:   [facility], [place] office  PCP:  Owens Loffler, MD  Cardiologist:  Patsy Baltimore   Chief Complaint:  SOB, cough    History of Present Illness:    Tony Watson is a 68 y.o. male who presents via audio/video conferencing for a telehealth visit today.   The patient does not symptoms concerning for COVID-19 infection (fever, chills, cough, or new SHORTNESS OF BREATH).   Patient has a past medical history of coronary artery disease  status post inferior myocardial infarction in 1999  PCI to the mid RCA  recurrent TIAs with surgical PFO closure in 2004.  hyperlipidemia,  hypertension  diabetes.  Smoker, quit 2 months ago Ejection fraction 25-30%  compliance issues with medications.   In the hospital May 2020  for COPD exacerbation  Pneumonia in the hospital April 2020  Still cough, thick Has a nebs, every 4 hours Can't use inhalers  Sees Dr. Mortimer Fries  Has visiting nurse Weight: 184.4 Weight in 03/2018: 145 pounds  Taking torsemide 40 daily HCT 29  Other records reviewed on today's visit  August 2019 for COPD exacerbation steroids bronchodilators No antibiotics as it was felt to be no pneumonia, Prednisone at discharge Unable to advance medications for heart failure given low blood pressure  Long discussion concerning recent echocardiogram findings Ejection fraction 25-30%  He continues to have shortness of breath with exertion 3 L nasal cannula oxygen today in the office  Denies any chest pain concerning for angina Might have tachycardia with his albuterol nebulizer  Previous testing stress test was in 2008 which showed evidence of inferior infarct without significant ischemia. Ejection fraction was 42%.   Prior CV studies:   The following studies were reviewed today:   Past Medical History:  Diagnosis Date  . Arthritis   . CAD (coronary artery disease)    a. inf MI 11/99 (in [place]) w/ PCI to [facility]; b. [place] 2004 [facility]): EF 50%, patent RCA stent, no obs dz; c. [place] 2008: EF 42%, inf infarct, no ischemia; d. R/LHC 9/19: 3-v dz w/ mild dif dz LM, m-dLAD 50, dLAD 60, D2 80, RI 80, mRCA-1 90, mRCA-2 20, patent RCA stent, CO/CI 3.7/2  . Chronic combined systolic (congestive) and diastolic (congestive) heart failure ([place])    a. 7/29019 Echo: EF 25-30%, sev diff HK, mild MR  mod dil LA, mildly reduced RVSF, elevated CVP  . Chronic respiratory failure with hypoxia (HCC)    a. on 2L supplemental oxygen via [place] followed by pulmonology  . CML (chronic myelocytic leukemia) ([place])   . COPD (chronic obstructive pulmonary disease) ([place])   . Diabetes mellitus without complication ([place])   . Diabetic autonomic neuropathy associated with type 2 diabetes mellitus ([place]) 03/27/2015  . Diverticulosis  2002  . GERD (gastroesophageal reflux disease)   . HTN (hypertension)   . Hyperlipidemia   . ILD (interstitial lung disease) ([place])   . Ischemic cardiomyopathy   . Medical non-compliance   . Smoker   . Spinal stenosis   . TIA (transient ischemic attack)    a. s/p PFO closure 2004 (in [place])  . Ulcer    Past Surgical History:  Procedure Laterality Date  . BACK SURGERY     patient denies-just lumbar punctures  . BRONCHOSCOPY    . CARDIAC CATHETERIZATION  11/99   CI per PMH  . CATARACT EXTRACTION W/PHACO Right 11/24/2016   Procedure: CATARACT EXTRACTION PHACO AND INTRAOCULAR LENS PLACEMENT (IOC);  Surgeon: Birder Robson, MD;  Location: [facility] ORS;  Service: Ophthalmology;  Laterality: Right;  [place] 1:04.3 AP% 22.3 CDE 14.35 Fluid pack lot # [id number]  . CATARACT EXTRACTION W/PHACO Left 12/29/2016   Procedure: CATARACT EXTRACTION PHACO AND INTRAOCULAR LENS PLACEMENT (IOC);  Surgeon: Birder Robson, MD;  Location: [facility] ORS;  Service: Ophthalmology;  Laterality: Left;  [place] 00:52 AP% 18.5 CDE 9.67 Fluid pack lot # [id number]  . COLONOSCOPY    . COLONOSCOPY WITH PROPOFOL N/A 05/25/2017   Procedure: COLONOSCOPY WITH PROPOFOL;  Surgeon: Jonathon Bellows, MD;  Location: [facility] ENDOSCOPY;  Service: Gastroenterology;  Laterality: N/A;  . CORONARY ANGIOPLASTY     STENT  . CORONARY ARTERY BYPASS GRAFT     stent  . ESOPHAGOGASTRODUODENOSCOPY (EGD) WITH PROPOFOL N/A 05/25/2017   Procedure: ESOPHAGOGASTRODUODENOSCOPY (EGD) WITH PROPOFOL;  Surgeon: Jonathon Bellows, MD;  Location: [facility] ENDOSCOPY;  Service: Gastroenterology;  Laterality: N/A;  . EYE SURGERY    . FLEXIBLE BRONCHOSCOPY N/A 05/19/2017   Procedure: FLEXIBLE BRONCHOSCOPY;  Surgeon: Wilhelmina Mcardle, MD;  Location: [facility] ORS;  Service: Pulmonary;  Laterality: N/A;  . open heart surgery  2004   PFO repair  . RIGHT/LEFT HEART CATH AND CORONARY ANGIOGRAPHY N/A 05/03/2018   Procedure: RIGHT/LEFT HEART CATH AND CORONARY ANGIOGRAPHY;  Surgeon: Nelva Bush, MD;  Location: [facility] CV LAB;  Service: Cardiovascular;  Laterality: N/A;  . UPPER GI ENDOSCOPY       Current Meds  Medication Sig  . acetaminophen (TYLENOL) 500 MG tablet Take 1,000 mg by mouth daily as needed for moderate pain or headache.  Marland Kitchen aspirin 81 MG chewable tablet Chew 1 tablet (81 mg total) by mouth daily.  Marland Kitchen atorvastatin (LIPITOR) 40 MG tablet TAKE 1 TABLET (40 MG TOTAL) BY MOUTH DAILY AT 6 PM.  . BD INSULIN SYRINGE U/F 31G X 5/16" 1 ML MISC   . benzonatate (TESSALON) 200 MG capsule Take 1 capsule (200 mg total) by mouth 3 (three) times daily.  . benzonatate (TESSALON) 200 MG capsule Take 1 capsule (200 mg total) by mouth 3 (three) times daily as needed for cough.  . blood glucose meter kit and supplies Dispense based on patient and insurance preference. Use to check blood sugar daily.  . budesonide (PULMICORT) 0.5 MG/2ML nebulizer solution Take 2 mLs (0.5 mg total) by nebulization 2 (two) times daily.  . carvedilol (COREG) 12.5 MG tablet  Take 1 tablet (12.5 mg total) by mouth 2 (two) times daily.  . cetirizine (ZYRTEC) 10 MG chewable tablet Chew 10 mg by mouth daily.  . Cholecalciferol (VITAMIN D) 2000 units CAPS Take 2,000 Units by mouth every evening.  . formoterol (PERFOROMIST) 20 MCG/2ML nebulizer solution Take 2 mLs (20 mcg total) by nebulization 2 (two) times daily. J44.9  . gabapentin (NEURONTIN) 300 MG capsule Take 1 capsule (300 mg total) by mouth at bedtime.  Marland Kitchen guaiFENesin-codeine 100-10 MG/5ML syrup Take 5 mLs by mouth at bedtime as needed for cough.  . insulin aspart (NOVOLOG) 100 UNIT/ML injection 5 units subcutaneous prior to meals (okay to substitute generic or what is covered by [employer]) (Patient taking differently: 8 units subcutaneous prior to meals (okay to substitute generic or what is covered by [employer]))  . insulin glargine (LANTUS) 100 UNIT/ML injection Inject 0.2 mLs (20 Units total) into the skin daily. (Patient taking differently:  Inject 25 Units into the skin daily. )  . ipratropium-albuterol (DUONEB) 0.5-2.5 (3) MG/3ML SOLN Take 3 mLs by nebulization every 4 (four) hours as needed. DX:J44.9 (Patient taking differently: Take 3 mLs by nebulization every 4 (four) hours as needed (for shortness of breath/wheezing). )  . ivabradine (CORLANOR) 5 MG TABS tablet Take 1 tablet (5 mg total) by mouth 2 (two) times daily with a meal.  . LORazepam (ATIVAN) 0.5 MG tablet Take 1 tablet (0.5 mg total) by mouth 3 (three) times daily as needed for anxiety.  Marland Kitchen losartan (COZAAR) 25 MG tablet Take 0.5 tablets (12.5 mg total) by mouth daily.  . Multiple Vitamin (MULTIVITAMIN WITH MINERALS) TABS tablet Take 1 tablet by mouth daily.  Marland Kitchen nystatin (MYCOSTATIN) 100000 UNIT/ML suspension TAKE AS DIRECTED 5 MLS (500,000 UNITS TOTAL) IN THE MOUTH OR THROAT 4 TIMES DAILY (Patient taking differently: Use as directed 5 mLs in the mouth or throat 4 (four) times daily. )  . omeprazole (PRILOSEC) 20 MG capsule Take 20 mg by mouth daily.  . ondansetron (ZOFRAN ODT) 4 MG disintegrating tablet Take 1-2 tablets (4-8 mg total) by mouth every 6 (six) hours as needed for nausea or vomiting.  . predniSONE (DELTASONE) 5 MG tablet 6 tabs po daily for 3 days; 5 tabs po daily for three days; 4 tabs po daily for three days; 3 tabs po daily afterwards  . spironolactone (ALDACTONE) 25 MG tablet TAKE 1 TABLET BY MOUTH EVERY DAY  . tiZANidine (ZANAFLEX) 4 MG tablet Take 1 tablet (4 mg total) by mouth Nightly.  . torsemide (DEMADEX) 20 MG tablet Take 2 tablets (40 mg total) by mouth daily.  . vitamin C (ASCORBIC ACID) 500 MG tablet Take 500 mg by mouth daily.     Allergies:   Prilosec [omeprazole]   Social History   Tobacco Use  . Smoking status: Former Smoker    Packs/day: 0.50    Years: 45.00    Pack years: 22.50    Types: Cigarettes    Last attempt to quit: 01/25/2018    Years since quitting: 0.9  . Smokeless tobacco: Never Used  . Tobacco comment: 1 ppd +40 years   Substance Use Topics  . Alcohol use: Not Currently    Alcohol/week: 0.0 standard drinks    Comment: weekly but last dose 1 month  . Drug use: No     Current Outpatient Medications on File Prior to Visit  Medication Sig Dispense Refill  . acetaminophen (TYLENOL) 500 MG tablet Take 1,000 mg by mouth daily as needed for moderate  pain or headache.    Marland Kitchen aspirin 81 MG chewable tablet Chew 1 tablet (81 mg total) by mouth daily. 30 tablet 0  . atorvastatin (LIPITOR) 40 MG tablet TAKE 1 TABLET (40 MG TOTAL) BY MOUTH DAILY AT 6 PM. 30 tablet 0  . BD INSULIN SYRINGE U/F 31G X 5/16" 1 ML MISC     . benzonatate (TESSALON) 200 MG capsule Take 1 capsule (200 mg total) by mouth 3 (three) times daily. 20 capsule 0  . benzonatate (TESSALON) 200 MG capsule Take 1 capsule (200 mg total) by mouth 3 (three) times daily as needed for cough. 50 capsule 3  . blood glucose meter kit and supplies Dispense based on patient and insurance preference. Use to check blood sugar daily. 1 each 0  . budesonide (PULMICORT) 0.5 MG/2ML nebulizer solution Take 2 mLs (0.5 mg total) by nebulization 2 (two) times daily. 1440 mL 0  . carvedilol (COREG) 12.5 MG tablet Take 1 tablet (12.5 mg total) by mouth 2 (two) times daily. 180 tablet 3  . cetirizine (ZYRTEC) 10 MG chewable tablet Chew 10 mg by mouth daily.    . Cholecalciferol (VITAMIN D) 2000 units CAPS Take 2,000 Units by mouth every evening.    . formoterol (PERFOROMIST) 20 MCG/2ML nebulizer solution Take 2 mLs (20 mcg total) by nebulization 2 (two) times daily. J44.9 500 mL 12  . gabapentin (NEURONTIN) 300 MG capsule Take 1 capsule (300 mg total) by mouth at bedtime. 90 capsule 1  . guaiFENesin-codeine 100-10 MG/5ML syrup Take 5 mLs by mouth at bedtime as needed for cough. 180 mL 0  . insulin aspart (NOVOLOG) 100 UNIT/ML injection 5 units subcutaneous prior to meals (okay to substitute generic or what is covered by [employer]) (Patient taking differently: 8 units subcutaneous  prior to meals (okay to substitute generic or what is covered by [employer])) 10 mL 1  . insulin glargine (LANTUS) 100 UNIT/ML injection Inject 0.2 mLs (20 Units total) into the skin daily. (Patient taking differently: Inject 25 Units into the skin daily. ) 10 mL 1  . ipratropium-albuterol (DUONEB) 0.5-2.5 (3) MG/3ML SOLN Take 3 mLs by nebulization every 4 (four) hours as needed. DX:J44.9 (Patient taking differently: Take 3 mLs by nebulization every 4 (four) hours as needed (for shortness of breath/wheezing). ) 1620 mL 5  . ivabradine (CORLANOR) 5 MG TABS tablet Take 1 tablet (5 mg total) by mouth 2 (two) times daily with a meal. 60 tablet 5  . LORazepam (ATIVAN) 0.5 MG tablet Take 1 tablet (0.5 mg total) by mouth 3 (three) times daily as needed for anxiety. 120 tablet 1  . losartan (COZAAR) 25 MG tablet Take 0.5 tablets (12.5 mg total) by mouth daily. 45 tablet 3  . Multiple Vitamin (MULTIVITAMIN WITH MINERALS) TABS tablet Take 1 tablet by mouth daily. 30 tablet 0  . nystatin (MYCOSTATIN) 100000 UNIT/ML suspension TAKE AS DIRECTED 5 MLS (500,000 UNITS TOTAL) IN THE MOUTH OR THROAT 4 TIMES DAILY (Patient taking differently: Use as directed 5 mLs in the mouth or throat 4 (four) times daily. ) 946 mL 1  . omeprazole (PRILOSEC) 20 MG capsule Take 20 mg by mouth daily.    . ondansetron (ZOFRAN ODT) 4 MG disintegrating tablet Take 1-2 tablets (4-8 mg total) by mouth every 6 (six) hours as needed for nausea or vomiting. 90 tablet 3  . predniSONE (DELTASONE) 5 MG tablet 6 tabs po daily for 3 days; 5 tabs po daily for three days; 4 tabs po daily for  three days; 3 tabs po daily afterwards 76 tablet 0  . spironolactone (ALDACTONE) 25 MG tablet TAKE 1 TABLET BY MOUTH EVERY DAY 30 tablet 0  . tiZANidine (ZANAFLEX) 4 MG tablet Take 1 tablet (4 mg total) by mouth Nightly. 90 tablet 3  . torsemide (DEMADEX) 20 MG tablet Take 2 tablets (40 mg total) by mouth daily. 90 tablet 3  . vitamin C (ASCORBIC ACID) 500 MG tablet  Take 500 mg by mouth daily.     No current facility-administered medications on file prior to visit.      Family Hx: The patient's family history includes Breast cancer in an other family member; Cancer in his mother; Coronary artery disease in an other family member; Heart disease in his father.  ROS:   Please see the history of present illness.    Review of Systems  Constitutional: Negative.   Respiratory: Positive for cough and shortness of breath.   Cardiovascular: Positive for leg swelling.  Gastrointestinal: Negative.   Musculoskeletal: Negative.   Neurological: Negative.   Psychiatric/Behavioral: Negative.   All other systems reviewed and are negative.     Labs/Other Tests and Data Reviewed:    Recent Labs: 03/03/2018: TSH 1.930 12/17/2018: ALT 47 12/18/2018: B Natriuretic Peptide 361.0; Magnesium 1.4 12/20/2018: BUN 38; Creatinine, Ser 0.99; Hemoglobin 9.6; Platelets 44; Potassium 4.1; Sodium 136   Recent Lipid Panel Lab Results  Component Value Date/Time   CHOL 144 07/16/2016 08:44 AM   CHOL 106 02/06/2013 04:55 AM   TRIG 93 12/17/2018 02:20 PM   TRIG 75 02/06/2013 04:55 AM   HDL 32.90 (L) 07/16/2016 08:44 AM   HDL 37 (L) 02/06/2013 04:55 AM   CHOLHDL 4 07/16/2016 08:44 AM   LDLCALC 79 07/16/2016 08:44 AM   LDLCALC 54 02/06/2013 04:55 AM   LDLDIRECT 78.0 10/12/2013 11:30 AM    Wt Readings from Last 3 Encounters:  12/26/18 185 lb 4 oz (84 kg)  12/21/18 190 lb 12.8 oz (86.5 kg)  11/30/18 185 lb (83.9 kg)     Exam:    Vital Signs: Vital signs may also be detailed in the HPI There were no vitals taken for this visit.  Wt Readings from Last 3 Encounters:  12/26/18 185 lb 4 oz (84 kg)  12/21/18 190 lb 12.8 oz (86.5 kg)  11/30/18 185 lb (83.9 kg)   Temp Readings from Last 3 Encounters:  12/26/18 (!) 96.8 F (36 C) (Oral)  12/21/18 97.8 F (36.6 C) (Oral)  11/30/18 97.8 F (36.6 C) (Oral)   BP Readings from Last 3 Encounters:  12/26/18 115/72   12/21/18 140/69  11/30/18 (!) 143/70   Pulse Readings from Last 3 Encounters:  12/26/18 86  12/21/18 67  11/30/18 83    142/63, pulse 80 resp 16 Nurse 110/60  Well nourished, well developed male in no acute distress. Constitutional:  oriented to person, place, and time. No distress.  Head: Normocephalic and atraumatic.  Eyes:  no discharge. No scleral icterus.  Neck: Normal range of motion. Neck supple.  Pulmonary/Chest: No audible wheezing, no distress, cough Musculoskeletal: Normal range of motion.  no  tenderness or deformity.  Neurological:   Coordination normal. Full exam not performed Skin:  No rash Psychiatric:  normal mood and affect. behavior is normal. Thought content normal.    ASSESSMENT & PLAN:    Chronic systolic heart failure (HCC) Worsening lower extremity edema Also with cough, shortness of breath on oxygen Recommended he increase torsemide up to 60 mg daily or  40 mg in the morning with extra 20 mg in the afternoon as needed for leg swelling Once edema resolves go back to torsemide 40 daily  ILD (interstitial lung disease) ([place]) Followed by pulmonary, on prednisone and oxygen Unable to tolerate inhalers, uses nebulizers every 4 hours  Coronary artery disease of native artery of native heart with stable angina pectoris (HCC) Currently with no symptoms of angina. No further workup at this time. Continue current medication regimen. On aspirin with statin  Type 2 diabetes mellitus with vascular disease ([place]) We have encouraged continued careful diet management in an effort to lose weight. Sugars running very high from the prednisone, he will discuss with primary care  Chronic obstructive pulmonary disease, unspecified COPD type ([place]) Followed by pulmonary, End-stage lung disease Frequent hospital admissions for COPD exacerbation  Mixed hyperlipidemia Continue Lipitor  Essential hypertension Blood pressure is well controlled on today's visit. No  changes made to the medications.   COVID-19 Education: The signs and symptoms of COVID-19 were discussed with the patient and how to seek care for testing (follow up with PCP or arrange E-visit).  The importance of social distancing was discussed today.  Patient Risk:   After full review of this patients clinical status, I feel that they are at least moderate risk at this time.  Time:   Today, I have spent 25 minutes with the patient with telehealth technology discussing the cardiac and medical problems/diagnoses detailed above   10 min spent reviewing the chart prior to patient visit today   Medication Adjustments/Labs and Tests Ordered: Current medicines are reviewed at length with the patient today.  Concerns regarding medicines are outlined above.   Tests Ordered: No tests ordered   Medication Changes: No changes made   Disposition: Follow-up in 1 month   Signed, Ida Rogue, MD  12/29/2018 2:31 PM    [place] Office [address] [place] [address], [place], [place] [address]

## 2018-12-27 NOTE — Telephone Encounter (Signed)
Tony Watson PT with [name] called requesting verbal orders for PT Tony Watson is requesting two times a week for two weeks One time a week for 3 weeks Tony Watson stated that they are required to call the doctor if Sat O2 is less than 82%.Tony Watson wants to know if they can change the guidelines to call if they can not get his level up in the 80%-90%? Tony Watson stated that today his O2 dropped to 77% after a short walk.

## 2018-12-28 ENCOUNTER — Telehealth: Payer: Self-pay

## 2018-12-28 NOTE — Telephone Encounter (Signed)
TELEPHONE CALL NOTE  KASAI BELTRAN has been deemed a candidate for a follow-up tele-health visit to limit community exposure during the Covid-19 pandemic. I spoke with the patient via phone to ensure availability of phone/video source, confirm preferred email & phone number, discuss instructions and expectations, and review consent.   I reminded SHERVIN CYPERT to be prepared with any vital sign and/or heart rhythm information that could potentially be obtained via home monitoring, at the time of his visit.  Finally, I reminded ELIZA GRISSINGER to expect an e-mail containing a link for their video-based visit approximately 15 minutes before his visit, or alternatively, a phone call at the time of his visit if his visit is planned to be a phone encounter.  Did the patient verbally consent to treatment as below? YES  Gaylord Shih, CMA 12/28/2018 1:43 PM  CONSENT FOR TELE-HEALTH VISIT - PLEASE REVIEW  I hereby voluntarily request, consent and authorize [facility] and its employed or contracted physicians, physician assistants, nurse practitioners or other licensed health care professionals (the Practitioner), to provide me with telemedicine health care services (the "Services") as deemed necessary by the treating Practitioner. I acknowledge and consent to receive the Services by the Practitioner via telemedicine. I understand that the telemedicine visit will involve communicating with the Practitioner through telephonic communication technology and the disclosure of certain medical information by electronic transmission. I acknowledge that I have been given the opportunity to request an in-person assessment or other available alternative prior to the telemedicine visit and am voluntarily participating in the telemedicine visit.  I understand that I have the right to withhold or withdraw my consent to the use of telemedicine in the course of my care at any time, without affecting  my right to future care or treatment, and that the Practitioner or I may terminate the telemedicine visit at any time. I understand that I have the right to inspect all information obtained and/or recorded in the course of the telemedicine visit and may receive copies of available information for a reasonable fee.  I understand that some of the potential risks of receiving the Services via telemedicine include:  Marland Kitchen Delay or interruption in medical evaluation due to technological equipment failure or disruption; . Information transmitted may not be sufficient (e.g. poor resolution of images) to allow for appropriate medical decision making by the Practitioner; and/or  . In rare instances, security protocols could fail, causing a breach of personal health information.  Furthermore, I acknowledge that it is my responsibility to provide information about my medical history, conditions and care that is complete and accurate to the best of my ability. I acknowledge that Practitioner's advice, recommendations, and/or decision may be based on factors not within their control, such as incomplete or inaccurate data provided by me or lack of visual representation. I understand that the practice of medicine is not an exact science and that Practitioner makes no warranties or guarantees regarding treatment outcomes. I acknowledge that I will receive a copy of this consent concurrently upon execution via email to the email address I last provided but may also request a printed copy by calling the office of [facility].    I understand that my insurance may be billed for this visit.   I have read or had this consent read to me. . I understand the contents of this consent, which adequately explains the benefits and risks of the Services being provided via telemedicine.  Marland Kitchen  I have been provided ample opportunity to ask questions regarding this consent and the Services and have had my questions answered to my  satisfaction. . I give my informed consent for the services to be provided through the use of telemedicine in my medical care  By participating in this telemedicine visit I agree to the above.

## 2018-12-28 NOTE — Telephone Encounter (Signed)
Please call  I think it is ok to drop the parameters as stated.  He has chronic respiratory failure, interstitial lung disease and end-stage COPD.  He will drop with minimal activity but should recover.

## 2018-12-28 NOTE — Telephone Encounter (Signed)
   TELEPHONE CALL NOTE  This patient has been deemed a candidate for follow-up tele-health visit to limit community exposure during the Covid-19 pandemic. I spoke with the patient via phone to discuss instructions. The patient was advised to review the section on consent for treatment as well. The patient will receive a phone call 2-3 days prior to their E-Visit at which time consent will be verbally confirmed. A Virtual Office Visit appointment type has been scheduled for 12/30/2018 with Darylene Price FNP.  Vonda Antigua L, CMA 12/28/2018 1:42 PM

## 2018-12-28 NOTE — Telephone Encounter (Signed)
Left message for Raquel Sarna giving her verbal orders for PT 2 x a week for two weeks then 1 x a week for 3 weeks.Also gave verbal order to change Sat O2 parameters to call if they can not get his level up in the 80%-90% per Dr. Lorelei Pont.

## 2018-12-29 ENCOUNTER — Other Ambulatory Visit: Payer: Self-pay

## 2018-12-29 ENCOUNTER — Telehealth (INDEPENDENT_AMBULATORY_CARE_PROVIDER_SITE_OTHER): Payer: Medicare HMO | Admitting: Cardiovascular Disease

## 2018-12-29 DIAGNOSIS — J449 Chronic obstructive pulmonary disease, unspecified: Secondary | ICD-10-CM

## 2018-12-29 DIAGNOSIS — I110 Hypertensive heart disease with heart failure: Secondary | ICD-10-CM | POA: Diagnosis not present

## 2018-12-29 DIAGNOSIS — J9621 Acute and chronic respiratory failure with hypoxia: Secondary | ICD-10-CM | POA: Diagnosis not present

## 2018-12-29 DIAGNOSIS — I25118 Atherosclerotic heart disease of native coronary artery with other forms of angina pectoris: Secondary | ICD-10-CM | POA: Diagnosis not present

## 2018-12-29 DIAGNOSIS — I10 Essential (primary) hypertension: Secondary | ICD-10-CM

## 2018-12-29 DIAGNOSIS — E1151 Type 2 diabetes mellitus with diabetic peripheral angiopathy without gangrene: Secondary | ICD-10-CM

## 2018-12-29 DIAGNOSIS — E1143 Type 2 diabetes mellitus with diabetic autonomic (poly)neuropathy: Secondary | ICD-10-CM | POA: Diagnosis not present

## 2018-12-29 DIAGNOSIS — I5042 Chronic combined systolic (congestive) and diastolic (congestive) heart failure: Secondary | ICD-10-CM | POA: Diagnosis not present

## 2018-12-29 DIAGNOSIS — E782 Mixed hyperlipidemia: Secondary | ICD-10-CM

## 2018-12-29 DIAGNOSIS — J440 Chronic obstructive pulmonary disease with acute lower respiratory infection: Secondary | ICD-10-CM | POA: Diagnosis not present

## 2018-12-29 DIAGNOSIS — J441 Chronic obstructive pulmonary disease with (acute) exacerbation: Secondary | ICD-10-CM | POA: Diagnosis not present

## 2018-12-29 DIAGNOSIS — I5022 Chronic systolic (congestive) heart failure: Secondary | ICD-10-CM | POA: Diagnosis not present

## 2018-12-29 DIAGNOSIS — E1159 Type 2 diabetes mellitus with other circulatory complications: Secondary | ICD-10-CM

## 2018-12-29 DIAGNOSIS — J849 Interstitial pulmonary disease, unspecified: Secondary | ICD-10-CM

## 2018-12-29 DIAGNOSIS — I2510 Atherosclerotic heart disease of native coronary artery without angina pectoris: Secondary | ICD-10-CM | POA: Diagnosis not present

## 2018-12-29 DIAGNOSIS — J189 Pneumonia, unspecified organism: Secondary | ICD-10-CM | POA: Diagnosis not present

## 2018-12-29 DIAGNOSIS — R000 Tachycardia, unspecified: Secondary | ICD-10-CM

## 2018-12-29 DIAGNOSIS — J209 Acute bronchitis, unspecified: Secondary | ICD-10-CM | POA: Diagnosis not present

## 2018-12-29 MED ORDER — TORSEMIDE 20 MG PO TABS: ORAL_TABLET | ORAL | 3 refills | Status: DC

## 2018-12-29 NOTE — Patient Instructions (Addendum)
Medication Instructions:  Your physician has recommended you make the following change in your medication:  1. TAKE Torsemide 20 mg take 2 tablets (40 mg) once daily. 2. AS NEEDED may take extra 1 tablet (20 mg) as needed for leg swelling.   Use Ace wraps for leg edema, compression hose/socks, and make sure to elevate your legs when sitting.   If you need a refill on your cardiac medications before your next appointment, please call your pharmacy.   Lab work: No new labs needed  If you have labs (blood work) drawn today and your tests are completely normal, you will receive your results only by: Marland Kitchen MyChart Message (if you have MyChart) OR . A paper copy in the mail If you have any lab test that is abnormal or we need to change your treatment, we will call you to review the results.   Testing/Procedures: No new testing needed   Follow-Up: At [facility], you and your health needs are our priority.  As part of our continuing mission to provide you with exceptional heart care, we have created designated Provider Care Teams.  These Care Teams include your primary Cardiologist (physician) and Advanced Practice Providers (APPs -  Physician Assistants and Nurse Practitioners) who all work together to provide you with the care you need, when you need it.  . You will need a follow up appointment in 1 month  . Providers on your designated Care Team:   . Murray Hodgkins, NP . Christell Faith, PA-C . Marrianne Mood, PA-C  Any Other Special Instructions Will Be Listed Below (If Applicable).  For educational health videos Log in to : [email or web address] Or : [email or web address], password : [email or web address]   Edema  Edema is when you have too much fluid in your body or under your skin. Edema may make your legs, feet, and ankles swell up. Swelling is also common in looser tissues, like around your eyes. This is a common condition. It gets more common as you get older. There are many possible causes of edema.  Eating too much salt (sodium) and being on your feet or sitting for a long time can cause edema in your legs, feet, and ankles. Hot weather may make edema worse. Edema is usually painless. Your skin may look swollen or shiny. Follow these instructions at home:  Keep the swollen body part raised (elevated) above the level of your heart when you are sitting or lying down.  Do not sit still or stand for a long time.  Do not wear tight clothes. Do not wear garters on your upper legs.  Exercise your legs. This can help the swelling go down.  Wear elastic bandages or support stockings as told by your doctor.  Eat a low-salt (low-sodium) diet to reduce fluid as told by your doctor.  Depending on the cause of your swelling, you may need to limit how much fluid you drink (fluid restriction).  Take over-the-counter and prescription medicines only as told by your doctor. Contact a doctor if:  Treatment is not working.  You have heart, liver, or kidney disease and have symptoms of edema.  You have sudden and unexplained weight gain. Get help right away if:  You have shortness of breath or chest pain.  You cannot breathe when you lie down.  You have pain, redness, or warmth in the swollen areas.  You have heart, liver, or kidney disease and get edema all of a sudden.  You have a fever and your symptoms get  worse all of a sudden. Summary  Edema is when you have too much fluid in your body or under your skin.  Edema may make your legs, feet, and ankles swell up. Swelling is also common in looser tissues, like around your eyes.  Raise (elevate) the swollen body part above the level of your heart when you are sitting or lying down.  Follow your doctor's instructions about diet and how much fluid you can drink (fluid restriction). This information is not intended to replace advice given to you by your health care provider. Make sure you discuss any questions you have with your health care  provider. Document Released: 01/13/2008 Document Revised: 08/14/2016 Document Reviewed: 08/14/2016 Elsevier Interactive Patient Education  2019 [employer].

## 2018-12-30 ENCOUNTER — Telehealth: Payer: Self-pay

## 2018-12-30 ENCOUNTER — Ambulatory Visit: Payer: Medicare HMO | Admitting: Family

## 2018-12-30 ENCOUNTER — Telehealth: Payer: Self-pay | Admitting: Family

## 2018-12-30 ENCOUNTER — Other Ambulatory Visit: Payer: Self-pay

## 2018-12-30 DIAGNOSIS — J209 Acute bronchitis, unspecified: Secondary | ICD-10-CM | POA: Diagnosis not present

## 2018-12-30 DIAGNOSIS — I110 Hypertensive heart disease with heart failure: Secondary | ICD-10-CM | POA: Diagnosis not present

## 2018-12-30 DIAGNOSIS — J440 Chronic obstructive pulmonary disease with acute lower respiratory infection: Secondary | ICD-10-CM | POA: Diagnosis not present

## 2018-12-30 DIAGNOSIS — I5042 Chronic combined systolic (congestive) and diastolic (congestive) heart failure: Secondary | ICD-10-CM | POA: Diagnosis not present

## 2018-12-30 DIAGNOSIS — J441 Chronic obstructive pulmonary disease with (acute) exacerbation: Secondary | ICD-10-CM | POA: Diagnosis not present

## 2018-12-30 DIAGNOSIS — I2510 Atherosclerotic heart disease of native coronary artery without angina pectoris: Secondary | ICD-10-CM | POA: Diagnosis not present

## 2018-12-30 DIAGNOSIS — E1143 Type 2 diabetes mellitus with diabetic autonomic (poly)neuropathy: Secondary | ICD-10-CM | POA: Diagnosis not present

## 2018-12-30 DIAGNOSIS — J189 Pneumonia, unspecified organism: Secondary | ICD-10-CM | POA: Diagnosis not present

## 2018-12-30 DIAGNOSIS — J9621 Acute and chronic respiratory failure with hypoxia: Secondary | ICD-10-CM | POA: Diagnosis not present

## 2018-12-30 NOTE — Telephone Encounter (Signed)
Called patient at 1:33pm and 1:46pm for his visit. Both times it went to voicemail and I left messages both times. Will attempt to R/S the appointment.

## 2018-12-30 NOTE — Telephone Encounter (Signed)
Tommy PT with [facility] said he cannot treat pt today due to high BS. 12/30/18 FBS 332;                                                     1PM BS 448                                                    2:50  BS 411 Pt took Novolog 8 U at 8 AM and Lantus 25 U at 8 AM prior to breakfast Pt did not eat lunch and did not take Novolog 8U at lunch Pt has 2+ edema in extremities Wt 184.2 today (down 1 lb since 12/27/18) No CP or unusual SOB. Pt is asymptomatic with any diabetes symptoms.  Dr Diona Browner advised pt to take Novolog 8 U now and drink lots of water; Pt is to eat a"healthy" dinner and take Novolog 8 U prior to dinner as usual. Before HS ck BS and if greater than 400 take additional 5 U of Lantus ; if pt has to take additonal Lantus 5 U at hs tonight on Sat,Sun,Mon&Tues pt should take Lantus 30 U each day.  If pt does not have to take additonal lantus at hs tonight continue regular schedule of Lantus 25 U in AM and Novolog 8 U prior each meal.  Pt will keep BS log and update Dr Lorelei Pont on 01/09/2019.  ED precautions given. Tommy wrote all instructions down and voiced understanding and is sure he can convey to pt. FYI to Dr Diona Browner and Dr Lorelei Pont.

## 2018-12-31 ENCOUNTER — Emergency Department: Payer: Medicare HMO

## 2018-12-31 ENCOUNTER — Other Ambulatory Visit: Payer: Self-pay

## 2018-12-31 ENCOUNTER — Inpatient Hospital Stay
Admission: EM | Admit: 2018-12-31 | Discharge: 2019-01-09 | DRG: 193 | Disposition: E | Payer: Medicare HMO | Attending: Family Medicine | Admitting: Family Medicine

## 2018-12-31 ENCOUNTER — Encounter: Payer: Self-pay | Admitting: Emergency Medicine

## 2018-12-31 DIAGNOSIS — I2510 Atherosclerotic heart disease of native coronary artery without angina pectoris: Secondary | ICD-10-CM | POA: Diagnosis present

## 2018-12-31 DIAGNOSIS — R0602 Shortness of breath: Secondary | ICD-10-CM | POA: Diagnosis not present

## 2018-12-31 DIAGNOSIS — R05 Cough: Secondary | ICD-10-CM | POA: Diagnosis not present

## 2018-12-31 DIAGNOSIS — F411 Generalized anxiety disorder: Secondary | ICD-10-CM

## 2018-12-31 DIAGNOSIS — I10 Essential (primary) hypertension: Secondary | ICD-10-CM | POA: Diagnosis not present

## 2018-12-31 DIAGNOSIS — D696 Thrombocytopenia, unspecified: Secondary | ICD-10-CM | POA: Diagnosis not present

## 2018-12-31 DIAGNOSIS — Y95 Nosocomial condition: Secondary | ICD-10-CM | POA: Diagnosis present

## 2018-12-31 DIAGNOSIS — Z8673 Personal history of transient ischemic attack (TIA), and cerebral infarction without residual deficits: Secondary | ICD-10-CM

## 2018-12-31 DIAGNOSIS — J189 Pneumonia, unspecified organism: Secondary | ICD-10-CM | POA: Diagnosis not present

## 2018-12-31 DIAGNOSIS — Z20828 Contact with and (suspected) exposure to other viral communicable diseases: Secondary | ICD-10-CM | POA: Diagnosis present

## 2018-12-31 DIAGNOSIS — Z9981 Dependence on supplemental oxygen: Secondary | ICD-10-CM | POA: Diagnosis not present

## 2018-12-31 DIAGNOSIS — I452 Bifascicular block: Secondary | ICD-10-CM | POA: Diagnosis present

## 2018-12-31 DIAGNOSIS — J449 Chronic obstructive pulmonary disease, unspecified: Secondary | ICD-10-CM | POA: Diagnosis not present

## 2018-12-31 DIAGNOSIS — I252 Old myocardial infarction: Secondary | ICD-10-CM

## 2018-12-31 DIAGNOSIS — J9621 Acute and chronic respiratory failure with hypoxia: Secondary | ICD-10-CM | POA: Diagnosis present

## 2018-12-31 DIAGNOSIS — Z79899 Other long term (current) drug therapy: Secondary | ICD-10-CM

## 2018-12-31 DIAGNOSIS — Z8249 Family history of ischemic heart disease and other diseases of the circulatory system: Secondary | ICD-10-CM

## 2018-12-31 DIAGNOSIS — J440 Chronic obstructive pulmonary disease with acute lower respiratory infection: Secondary | ICD-10-CM | POA: Diagnosis not present

## 2018-12-31 DIAGNOSIS — Z8701 Personal history of pneumonia (recurrent): Secondary | ICD-10-CM

## 2018-12-31 DIAGNOSIS — K219 Gastro-esophageal reflux disease without esophagitis: Secondary | ICD-10-CM | POA: Diagnosis present

## 2018-12-31 DIAGNOSIS — R64 Cachexia: Secondary | ICD-10-CM | POA: Diagnosis present

## 2018-12-31 DIAGNOSIS — I255 Ischemic cardiomyopathy: Secondary | ICD-10-CM | POA: Diagnosis not present

## 2018-12-31 DIAGNOSIS — Z7951 Long term (current) use of inhaled steroids: Secondary | ICD-10-CM

## 2018-12-31 DIAGNOSIS — J8410 Pulmonary fibrosis, unspecified: Secondary | ICD-10-CM | POA: Diagnosis present

## 2018-12-31 DIAGNOSIS — D6959 Other secondary thrombocytopenia: Secondary | ICD-10-CM | POA: Diagnosis not present

## 2018-12-31 DIAGNOSIS — Z515 Encounter for palliative care: Secondary | ICD-10-CM | POA: Diagnosis not present

## 2018-12-31 DIAGNOSIS — Z955 Presence of coronary angioplasty implant and graft: Secondary | ICD-10-CM

## 2018-12-31 DIAGNOSIS — C9310 Chronic myelomonocytic leukemia not having achieved remission: Secondary | ICD-10-CM | POA: Diagnosis not present

## 2018-12-31 DIAGNOSIS — Z8774 Personal history of (corrected) congenital malformations of heart and circulatory system: Secondary | ICD-10-CM

## 2018-12-31 DIAGNOSIS — E1143 Type 2 diabetes mellitus with diabetic autonomic (poly)neuropathy: Secondary | ICD-10-CM | POA: Diagnosis not present

## 2018-12-31 DIAGNOSIS — R069 Unspecified abnormalities of breathing: Secondary | ICD-10-CM | POA: Diagnosis not present

## 2018-12-31 DIAGNOSIS — J441 Chronic obstructive pulmonary disease with (acute) exacerbation: Secondary | ICD-10-CM | POA: Diagnosis present

## 2018-12-31 DIAGNOSIS — Z888 Allergy status to other drugs, medicaments and biological substances status: Secondary | ICD-10-CM

## 2018-12-31 DIAGNOSIS — E785 Hyperlipidemia, unspecified: Secondary | ICD-10-CM | POA: Diagnosis present

## 2018-12-31 DIAGNOSIS — Z66 Do not resuscitate: Secondary | ICD-10-CM | POA: Diagnosis present

## 2018-12-31 DIAGNOSIS — J9620 Acute and chronic respiratory failure, unspecified whether with hypoxia or hypercapnia: Secondary | ICD-10-CM

## 2018-12-31 DIAGNOSIS — D630 Anemia in neoplastic disease: Secondary | ICD-10-CM | POA: Diagnosis present

## 2018-12-31 DIAGNOSIS — Z951 Presence of aortocoronary bypass graft: Secondary | ICD-10-CM

## 2018-12-31 DIAGNOSIS — C9210 Chronic myeloid leukemia, BCR/ABL-positive, not having achieved remission: Secondary | ICD-10-CM | POA: Diagnosis not present

## 2018-12-31 DIAGNOSIS — D539 Nutritional anemia, unspecified: Secondary | ICD-10-CM | POA: Diagnosis not present

## 2018-12-31 DIAGNOSIS — Z87891 Personal history of nicotine dependence: Secondary | ICD-10-CM

## 2018-12-31 DIAGNOSIS — R918 Other nonspecific abnormal finding of lung field: Secondary | ICD-10-CM | POA: Diagnosis not present

## 2018-12-31 DIAGNOSIS — Z6834 Body mass index (BMI) 34.0-34.9, adult: Secondary | ICD-10-CM

## 2018-12-31 DIAGNOSIS — I440 Atrioventricular block, first degree: Secondary | ICD-10-CM | POA: Diagnosis present

## 2018-12-31 DIAGNOSIS — R0603 Acute respiratory distress: Secondary | ICD-10-CM | POA: Diagnosis not present

## 2018-12-31 DIAGNOSIS — R0902 Hypoxemia: Secondary | ICD-10-CM | POA: Diagnosis present

## 2018-12-31 DIAGNOSIS — J849 Interstitial pulmonary disease, unspecified: Secondary | ICD-10-CM | POA: Diagnosis not present

## 2018-12-31 DIAGNOSIS — I5043 Acute on chronic combined systolic (congestive) and diastolic (congestive) heart failure: Secondary | ICD-10-CM | POA: Diagnosis not present

## 2018-12-31 DIAGNOSIS — I5042 Chronic combined systolic (congestive) and diastolic (congestive) heart failure: Secondary | ICD-10-CM | POA: Diagnosis not present

## 2018-12-31 DIAGNOSIS — J9690 Respiratory failure, unspecified, unspecified whether with hypoxia or hypercapnia: Secondary | ICD-10-CM | POA: Diagnosis not present

## 2018-12-31 DIAGNOSIS — K0889 Other specified disorders of teeth and supporting structures: Secondary | ICD-10-CM | POA: Diagnosis present

## 2018-12-31 DIAGNOSIS — I110 Hypertensive heart disease with heart failure: Secondary | ICD-10-CM | POA: Diagnosis present

## 2018-12-31 DIAGNOSIS — I340 Nonrheumatic mitral (valve) insufficiency: Secondary | ICD-10-CM | POA: Diagnosis not present

## 2018-12-31 DIAGNOSIS — Z7982 Long term (current) use of aspirin: Secondary | ICD-10-CM

## 2018-12-31 DIAGNOSIS — J209 Acute bronchitis, unspecified: Secondary | ICD-10-CM | POA: Diagnosis not present

## 2018-12-31 DIAGNOSIS — Z7189 Other specified counseling: Secondary | ICD-10-CM | POA: Diagnosis not present

## 2018-12-31 DIAGNOSIS — Z794 Long term (current) use of insulin: Secondary | ICD-10-CM

## 2018-12-31 DIAGNOSIS — J9622 Acute and chronic respiratory failure with hypercapnia: Secondary | ICD-10-CM | POA: Diagnosis not present

## 2018-12-31 LAB — CBC WITH DIFFERENTIAL/PLATELET
Abs Immature Granulocytes: 7.3 10*3/uL — ABNORMAL HIGH (ref 0.00–0.07)
Basophils Absolute: 0.2 10*3/uL — ABNORMAL HIGH (ref 0.0–0.1)
Basophils Relative: 0 %
Eosinophils Absolute: 0 10*3/uL (ref 0.0–0.5)
Eosinophils Relative: 0 %
HCT: 24.8 % — ABNORMAL LOW (ref 39.0–52.0)
Hemoglobin: 8.1 g/dL — ABNORMAL LOW (ref 13.0–17.0)
Immature Granulocytes: 12 %
Lymphocytes Relative: 12 %
Lymphs Abs: 7.5 10*3/uL — ABNORMAL HIGH (ref 0.7–4.0)
MCH: 35.2 pg — ABNORMAL HIGH (ref 26.0–34.0)
MCHC: 32.7 g/dL (ref 30.0–36.0)
MCV: 107.8 fL — ABNORMAL HIGH (ref 80.0–100.0)
Monocytes Absolute: 13.9 10*3/uL — ABNORMAL HIGH (ref 0.1–1.0)
Monocytes Relative: 23 %
Neutro Abs: 31.8 10*3/uL — ABNORMAL HIGH (ref 1.7–7.7)
Neutrophils Relative %: 53 %
Platelets: 12 10*3/uL — CL (ref 150–400)
RBC: 2.3 MIL/uL — ABNORMAL LOW (ref 4.22–5.81)
RDW: 17 % — ABNORMAL HIGH (ref 11.5–15.5)
Smear Review: DECREASED
WBC: 60.6 10*3/uL (ref 4.0–10.5)
nRBC: 0.4 % — ABNORMAL HIGH (ref 0.0–0.2)

## 2018-12-31 LAB — BRAIN NATRIURETIC PEPTIDE: B Natriuretic Peptide: 147 pg/mL — ABNORMAL HIGH (ref 0.0–100.0)

## 2018-12-31 LAB — PROTIME-INR
INR: 1.2 (ref 0.8–1.2)
Prothrombin Time: 14.7 seconds (ref 11.4–15.2)

## 2018-12-31 LAB — URINALYSIS, COMPLETE (UACMP) WITH MICROSCOPIC
Bacteria, UA: NONE SEEN
Bilirubin Urine: NEGATIVE
Glucose, UA: 50 mg/dL — AB
Hgb urine dipstick: NEGATIVE
Ketones, ur: NEGATIVE mg/dL
Leukocytes,Ua: NEGATIVE
Nitrite: NEGATIVE
Protein, ur: 30 mg/dL — AB
Specific Gravity, Urine: 1.018 (ref 1.005–1.030)
Squamous Epithelial / HPF: NONE SEEN (ref 0–5)
pH: 5 (ref 5.0–8.0)

## 2018-12-31 LAB — COMPREHENSIVE METABOLIC PANEL
ALT: 34 U/L (ref 0–44)
AST: 41 U/L (ref 15–41)
Albumin: 3.4 g/dL — ABNORMAL LOW (ref 3.5–5.0)
Alkaline Phosphatase: 80 U/L (ref 38–126)
Anion gap: 10 (ref 5–15)
BUN: 33 mg/dL — ABNORMAL HIGH (ref 8–23)
CO2: 33 mmol/L — ABNORMAL HIGH (ref 22–32)
Calcium: 8.6 mg/dL — ABNORMAL LOW (ref 8.9–10.3)
Chloride: 92 mmol/L — ABNORMAL LOW (ref 98–111)
Creatinine, Ser: 1.05 mg/dL (ref 0.61–1.24)
GFR calc Af Amer: >60 mL/min (ref >60)
GFR calc non Af Amer: >60 mL/min (ref >60)
Glucose, Bld: 79 mg/dL (ref 70–99)
Potassium: 3.1 mmol/L — ABNORMAL LOW (ref 3.5–5.1)
Sodium: 135 mmol/L (ref 135–145)
Total Bilirubin: 1.6 mg/dL — ABNORMAL HIGH (ref 0.3–1.2)
Total Protein: 7 g/dL (ref 6.5–8.1)

## 2018-12-31 LAB — SARS CORONAVIRUS 2 BY RT PCR (HOSPITAL ORDER, PERFORMED IN ~~LOC~~ HOSPITAL LAB): SARS Coronavirus 2: NEGATIVE

## 2018-12-31 LAB — LACTIC ACID, PLASMA
Lactic Acid, Venous: 2 mmol/L (ref 0.5–1.9)
Lactic Acid, Venous: 2 mmol/L (ref 0.5–1.9)

## 2018-12-31 LAB — GLUCOSE, CAPILLARY: Glucose-Capillary: 265 mg/dL — ABNORMAL HIGH (ref 70–99)

## 2018-12-31 LAB — MRSA PCR SCREENING: MRSA by PCR: NEGATIVE

## 2018-12-31 MED ORDER — BUDESONIDE 0.5 MG/2ML IN SUSP
0.5000 mg | Freq: Two times a day (BID) | RESPIRATORY_TRACT | Status: DC
  Administered 2018-12-31 – 2019-01-04 (×8): 0.5 mg via RESPIRATORY_TRACT
  Filled 2018-12-31 (×9): qty 2

## 2018-12-31 MED ORDER — METHYLPREDNISOLONE SODIUM SUCC 40 MG IJ SOLR
40.0000 mg | Freq: Four times a day (QID) | INTRAMUSCULAR | Status: DC
  Administered 2018-12-31 – 2019-01-04 (×15): 40 mg via INTRAVENOUS
  Filled 2018-12-31 (×15): qty 1

## 2018-12-31 MED ORDER — ADULT MULTIVITAMIN W/MINERALS CH
1.0000 | ORAL_TABLET | Freq: Every day | ORAL | Status: DC
  Administered 2019-01-01 – 2019-01-03 (×3): 1 via ORAL
  Filled 2018-12-31 (×3): qty 1

## 2018-12-31 MED ORDER — IPRATROPIUM-ALBUTEROL 0.5-2.5 (3) MG/3ML IN SOLN
3.0000 mL | Freq: Once | RESPIRATORY_TRACT | Status: AC
  Administered 2018-12-31: 3 mL via RESPIRATORY_TRACT

## 2018-12-31 MED ORDER — VITAMIN D3 25 MCG (1000 UNIT) PO TABS
2000.0000 [IU] | ORAL_TABLET | Freq: Every evening | ORAL | Status: DC
  Administered 2019-01-01 – 2019-01-03 (×3): 2000 [IU] via ORAL
  Filled 2018-12-31 (×7): qty 2

## 2018-12-31 MED ORDER — SODIUM CHLORIDE 0.9 % IV SOLN
INTRAVENOUS | Status: DC | PRN
  Administered 2018-12-31 – 2019-01-01 (×2): 250 mL via INTRAVENOUS
  Administered 2019-01-01: 500 mL via INTRAVENOUS
  Administered 2019-01-02: 18:00:00 250 mL via INTRAVENOUS
  Administered 2019-01-02: 21:00:00 20 mL via INTRAVENOUS
  Administered 2019-01-03: 250 mL via INTRAVENOUS
  Administered 2019-01-03: 20 mL via INTRAVENOUS
  Administered 2019-01-04: 250 mL via INTRAVENOUS

## 2018-12-31 MED ORDER — INSULIN ASPART 100 UNIT/ML ~~LOC~~ SOLN
0.0000 [IU] | Freq: Three times a day (TID) | SUBCUTANEOUS | Status: DC
  Administered 2019-01-01: 5 [IU] via SUBCUTANEOUS
  Administered 2019-01-01: 09:00:00 3 [IU] via SUBCUTANEOUS
  Administered 2019-01-01 – 2019-01-02 (×2): 7 [IU] via SUBCUTANEOUS
  Administered 2019-01-02: 18:00:00 5 [IU] via SUBCUTANEOUS
  Administered 2019-01-02 – 2019-01-03 (×3): 7 [IU] via SUBCUTANEOUS
  Administered 2019-01-03: 13:00:00 9 [IU] via SUBCUTANEOUS
  Administered 2019-01-04: 09:00:00 5 [IU] via SUBCUTANEOUS
  Filled 2018-12-31 (×10): qty 1

## 2018-12-31 MED ORDER — VANCOMYCIN HCL 1.5 G IV SOLR
1500.0000 mg | Freq: Once | INTRAVENOUS | Status: AC
  Administered 2018-12-31: 1500 mg via INTRAVENOUS
  Filled 2018-12-31: qty 1500

## 2018-12-31 MED ORDER — ONDANSETRON HCL 4 MG PO TABS: 4.0000 mg | ORAL_TABLET | Freq: Four times a day (QID) | ORAL | Status: DC | PRN

## 2018-12-31 MED ORDER — IVABRADINE HCL 5 MG PO TABS
5.0000 mg | ORAL_TABLET | Freq: Two times a day (BID) | ORAL | Status: DC
  Administered 2018-12-31 – 2019-01-03 (×7): 5 mg via ORAL
  Filled 2018-12-31 (×9): qty 1

## 2018-12-31 MED ORDER — CARVEDILOL 12.5 MG PO TABS
12.5000 mg | ORAL_TABLET | Freq: Two times a day (BID) | ORAL | Status: DC
  Administered 2019-01-01 – 2019-01-03 (×5): 12.5 mg via ORAL
  Filled 2018-12-31 (×2): qty 1
  Filled 2018-12-31: qty 4
  Filled 2018-12-31 (×3): qty 1
  Filled 2018-12-31: qty 4
  Filled 2018-12-31: qty 1
  Filled 2018-12-31: qty 4
  Filled 2018-12-31: qty 1
  Filled 2018-12-31: qty 4
  Filled 2018-12-31: qty 1
  Filled 2018-12-31: qty 4
  Filled 2018-12-31: qty 1
  Filled 2018-12-31: qty 4

## 2018-12-31 MED ORDER — IPRATROPIUM-ALBUTEROL 0.5-2.5 (3) MG/3ML IN SOLN
3.0000 mL | RESPIRATORY_TRACT | Status: DC | PRN
  Administered 2018-12-31 – 2019-01-01 (×2): 3 mL via RESPIRATORY_TRACT
  Filled 2018-12-31 (×2): qty 3

## 2018-12-31 MED ORDER — VANCOMYCIN HCL 10 G IV SOLR
1750.0000 mg | INTRAVENOUS | Status: DC
  Administered 2019-01-01 – 2019-01-02 (×2): 1750 mg via INTRAVENOUS
  Filled 2018-12-31 (×3): qty 1750

## 2018-12-31 MED ORDER — IPRATROPIUM-ALBUTEROL 0.5-2.5 (3) MG/3ML IN SOLN
3.0000 mL | Freq: Four times a day (QID) | RESPIRATORY_TRACT | Status: DC | PRN
  Administered 2018-12-31: 19:00:00 3 mL via RESPIRATORY_TRACT
  Filled 2018-12-31 (×2): qty 3

## 2018-12-31 MED ORDER — SODIUM CHLORIDE 0.9 % IV SOLN: 2.0000 g | Freq: Three times a day (TID) | INTRAVENOUS | Status: DC

## 2018-12-31 MED ORDER — BENZONATATE 100 MG PO CAPS: 200.0000 mg | ORAL_CAPSULE | Freq: Three times a day (TID) | ORAL | Status: DC | PRN

## 2018-12-31 MED ORDER — METHYLPREDNISOLONE SODIUM SUCC 125 MG IJ SOLR
125.0000 mg | Freq: Once | INTRAMUSCULAR | Status: AC
  Administered 2018-12-31: 125 mg via INTRAVENOUS
  Filled 2018-12-31: qty 2

## 2018-12-31 MED ORDER — ONDANSETRON HCL 4 MG/2ML IJ SOLN: 4.0000 mg | Freq: Four times a day (QID) | INTRAMUSCULAR | Status: DC | PRN

## 2018-12-31 MED ORDER — IPRATROPIUM-ALBUTEROL 0.5-2.5 (3) MG/3ML IN SOLN
3.0000 mL | Freq: Once | RESPIRATORY_TRACT | Status: AC
  Administered 2018-12-31: 3 mL via RESPIRATORY_TRACT
  Filled 2018-12-31: qty 3

## 2018-12-31 MED ORDER — ACETAMINOPHEN 650 MG RE SUPP: 650.0000 mg | Freq: Four times a day (QID) | RECTAL | Status: DC | PRN

## 2018-12-31 MED ORDER — GABAPENTIN 300 MG PO CAPS
300.0000 mg | ORAL_CAPSULE | Freq: Every day | ORAL | Status: DC
  Administered 2018-12-31 – 2019-01-03 (×4): 300 mg via ORAL
  Filled 2018-12-31 (×4): qty 1

## 2018-12-31 MED ORDER — TORSEMIDE 20 MG PO TABS
40.0000 mg | ORAL_TABLET | Freq: Every day | ORAL | Status: DC
  Administered 2019-01-02 – 2019-01-03 (×2): 40 mg via ORAL
  Filled 2018-12-31 (×3): qty 2

## 2018-12-31 MED ORDER — PANTOPRAZOLE SODIUM 40 MG PO TBEC
40.0000 mg | DELAYED_RELEASE_TABLET | Freq: Every day | ORAL | Status: DC
  Administered 2019-01-01 – 2019-01-04 (×4): 40 mg via ORAL
  Filled 2018-12-31 (×4): qty 1

## 2018-12-31 MED ORDER — VANCOMYCIN HCL 10 G IV SOLR: 2500.0000 mg | Freq: Once | INTRAVENOUS | Status: DC

## 2018-12-31 MED ORDER — LORATADINE 10 MG PO TABS
10.0000 mg | ORAL_TABLET | Freq: Every day | ORAL | Status: DC
  Administered 2019-01-01 – 2019-01-04 (×4): 10 mg via ORAL
  Filled 2018-12-31 (×4): qty 1

## 2018-12-31 MED ORDER — SODIUM CHLORIDE 0.9 % IV SOLN
1.0000 g | Freq: Once | INTRAVENOUS | Status: DC
  Administered 2018-12-31: 1 g via INTRAVENOUS
  Filled 2018-12-31: qty 1

## 2018-12-31 MED ORDER — IOHEXOL 350 MG/ML SOLN
75.0000 mL | Freq: Once | INTRAVENOUS | Status: AC | PRN
  Administered 2018-12-31: 75 mL via INTRAVENOUS

## 2018-12-31 MED ORDER — SODIUM CHLORIDE 0.9% FLUSH
3.0000 mL | Freq: Once | INTRAVENOUS | Status: AC
  Administered 2018-12-31: 3 mL via INTRAVENOUS

## 2018-12-31 MED ORDER — TIZANIDINE HCL 4 MG PO TABS
4.0000 mg | ORAL_TABLET | Freq: Every evening | ORAL | Status: DC
  Administered 2019-01-01 – 2019-01-03 (×4): 4 mg via ORAL
  Filled 2018-12-31 (×4): qty 1

## 2018-12-31 MED ORDER — LORAZEPAM 0.5 MG PO TABS
0.5000 mg | ORAL_TABLET | Freq: Three times a day (TID) | ORAL | Status: DC | PRN
  Administered 2019-01-01 – 2019-01-02 (×3): 0.5 mg via ORAL
  Filled 2018-12-31 (×3): qty 1

## 2018-12-31 MED ORDER — SODIUM CHLORIDE 0.9 % IV SOLN
2.0000 g | Freq: Three times a day (TID) | INTRAVENOUS | Status: DC
  Administered 2019-01-01 – 2019-01-02 (×3): 2 g via INTRAVENOUS
  Filled 2018-12-31 (×5): qty 2

## 2018-12-31 MED ORDER — INSULIN ASPART 100 UNIT/ML ~~LOC~~ SOLN
0.0000 [IU] | Freq: Every day | SUBCUTANEOUS | Status: DC
  Administered 2018-12-31: 3 [IU] via SUBCUTANEOUS
  Administered 2019-01-01 – 2019-01-03 (×3): 4 [IU] via SUBCUTANEOUS
  Filled 2018-12-31 (×5): qty 1

## 2018-12-31 MED ORDER — SPIRONOLACTONE 25 MG PO TABS
25.0000 mg | ORAL_TABLET | Freq: Every day | ORAL | Status: DC
  Administered 2019-01-01 – 2019-01-03 (×3): 25 mg via ORAL
  Filled 2018-12-31 (×4): qty 1

## 2018-12-31 MED ORDER — SODIUM CHLORIDE 0.9 % IV SOLN
1.0000 g | Freq: Once | INTRAVENOUS | Status: AC
  Administered 2019-01-01: 1 g via INTRAVENOUS
  Filled 2018-12-31: qty 1

## 2018-12-31 MED ORDER — VANCOMYCIN HCL IN DEXTROSE 1-5 GM/200ML-% IV SOLN
1000.0000 mg | Freq: Once | INTRAVENOUS | Status: DC
  Administered 2018-12-31: 1000 mg via INTRAVENOUS
  Filled 2018-12-31: qty 200

## 2018-12-31 MED ORDER — ACETAMINOPHEN 325 MG PO TABS: 650.0000 mg | ORAL_TABLET | Freq: Four times a day (QID) | ORAL | Status: DC | PRN

## 2018-12-31 MED ORDER — VITAMIN C 500 MG PO TABS
500.0000 mg | ORAL_TABLET | Freq: Every day | ORAL | Status: DC
  Administered 2019-01-01 – 2019-01-03 (×2): 500 mg via ORAL
  Filled 2018-12-31 (×3): qty 1

## 2018-12-31 MED ORDER — SODIUM CHLORIDE 0.9 % IV BOLUS: 1000.0000 mL | Freq: Once | INTRAVENOUS | Status: DC

## 2018-12-31 MED ORDER — ASPIRIN 81 MG PO CHEW
81.0000 mg | CHEWABLE_TABLET | Freq: Every day | ORAL | Status: DC
  Administered 2019-01-02 – 2019-01-03 (×2): 81 mg via ORAL
  Filled 2018-12-31 (×3): qty 1

## 2018-12-31 MED ORDER — IPRATROPIUM-ALBUTEROL 0.5-2.5 (3) MG/3ML IN SOLN
3.0000 mL | Freq: Four times a day (QID) | RESPIRATORY_TRACT | Status: DC
  Administered 2019-01-01: 08:00:00 3 mL via RESPIRATORY_TRACT
  Filled 2018-12-31: qty 3

## 2018-12-31 MED ORDER — LOSARTAN POTASSIUM 25 MG PO TABS
12.5000 mg | ORAL_TABLET | Freq: Every day | ORAL | Status: DC
  Administered 2019-01-01 – 2019-01-03 (×3): 12.5 mg via ORAL
  Filled 2018-12-31 (×4): qty 1

## 2018-12-31 MED ORDER — ATORVASTATIN CALCIUM 20 MG PO TABS
40.0000 mg | ORAL_TABLET | Freq: Every day | ORAL | Status: DC
  Administered 2018-12-31 – 2019-01-03 (×4): 40 mg via ORAL
  Filled 2018-12-31 (×4): qty 2

## 2018-12-31 NOTE — ED Notes (Signed)
Noted ST decrease to 70s while sleeping on 6l 02. Returned to [facility] with SAT to 93 while sleeping

## 2018-12-31 NOTE — ED Notes (Signed)
ED TO INPATIENT HANDOFF REPORT  ED Nurse Name and Phone #: Maudie Mercury [phone]  S Name/Age/Gender Tony Watson 68 y.o. male Room/Bed: ED24A/ED24A  Code Status   Code Status: DNR  Home/SNF/Other Home Patient oriented to: situation Is this baseline? Yes   Triage Complete: Triage complete  Chief Complaint EMS  Triage Note Patient arrives via EMS with cough and SOB worsening since was discharged for pneumonia one week ago. Audible rhonchi. Speaking full sentences. SOB and fatigues at rest. Sats 80 on [place] 6l. Changed to NRB with Sat increasing to 98.   Allergies Allergies  Allergen Reactions  . Prilosec [Omeprazole] Diarrhea    Level of Care/Admitting Diagnosis ED Disposition    ED Disposition Condition [facility]: [place] [[id number]]  Level of Care: Med-Surg [16]  Covid Evaluation: Screening Protocol (No Symptoms)  Diagnosis: Acute on chronic respiratory failure with hypoxia [facility]) [[id number]]  Admitting Physician: Henreitta Leber [[id number]]  Attending Physician: Henreitta Leber [[id number]]  Estimated length of stay: past midnight tomorrow  Certification:: I certify this patient will need inpatient services for at least 2 midnights  PT Class (Do Not Modify): Inpatient [101]  PT Acc Code (Do Not Modify): Private [1]       B Medical/Surgery History Past Medical History:  Diagnosis Date  . Arthritis   . CAD (coronary artery disease)    a. inf MI 11/99 (in [place]) w/ PCI to [facility]; b. [place] 2004 [facility] Fall): EF 50%, patent RCA stent, no obs dz; c. MV 2008: EF 42%, inf infarct, no ischemia; d. R/LHC 9/19: 3-v dz w/ mild dif dz LM, m-dLAD 50, dLAD 60, D2 80, RI 80, mRCA-1 90, mRCA-2 20, patent RCA stent, CO/CI 3.7/2  . Chronic combined systolic (congestive) and diastolic (congestive) heart failure ([name])    a. 7/29019 Echo: EF 25-30%, sev diff HK, mild MR mod dil LA, mildly reduced RVSF, elevated CVP  . Chronic respiratory failure with hypoxia (HCC)     a. on 2L supplemental oxygen via [place] followed by pulmonology  . CML (chronic myelocytic leukemia) ([name])   . COPD (chronic obstructive pulmonary disease) ([name])   . Diabetes mellitus without complication ([name])   . Diabetic autonomic neuropathy associated with type 2 diabetes mellitus ([name]) 03/27/2015  . Diverticulosis 2002  . GERD (gastroesophageal reflux disease)   . HTN (hypertension)   . Hyperlipidemia   . ILD (interstitial lung disease) ([place])   . Ischemic cardiomyopathy   . Medical non-compliance   . Smoker   . Spinal stenosis   . TIA (transient ischemic attack)    a. s/p PFO closure 2004 (in [place])  . Ulcer    Past Surgical History:  Procedure Laterality Date  . BACK SURGERY     patient denies-just lumbar punctures  . BRONCHOSCOPY    . CARDIAC CATHETERIZATION  11/99   CI per PMH  . CATARACT EXTRACTION W/PHACO Right 11/24/2016   Procedure: CATARACT EXTRACTION PHACO AND INTRAOCULAR LENS PLACEMENT (IOC);  Surgeon: Birder Robson, MD;  Location: [facility] ORS;  Service: Ophthalmology;  Laterality: Right;  [place] 1:04.3 AP% 22.3 CDE 14.35 Fluid pack lot # [id number]  . CATARACT EXTRACTION W/PHACO Left 12/29/2016   Procedure: CATARACT EXTRACTION PHACO AND INTRAOCULAR LENS PLACEMENT (IOC);  Surgeon: Birder Robson, MD;  Location: [facility] ORS;  Service: Ophthalmology;  Laterality: Left;  [place] 00:52 AP% 18.5 CDE 9.67 Fluid pack lot # [id number]  . COLONOSCOPY    . COLONOSCOPY WITH PROPOFOL N/A 05/25/2017  Procedure: COLONOSCOPY WITH PROPOFOL;  Surgeon: Jonathon Bellows, MD;  Location: [facility] ENDOSCOPY;  Service: Gastroenterology;  Laterality: N/A;  . CORONARY ANGIOPLASTY     STENT  . CORONARY ARTERY BYPASS GRAFT     stent  . ESOPHAGOGASTRODUODENOSCOPY (EGD) WITH PROPOFOL N/A 05/25/2017   Procedure: ESOPHAGOGASTRODUODENOSCOPY (EGD) WITH PROPOFOL;  Surgeon: Jonathon Bellows, MD;  Location: [facility] ENDOSCOPY;  Service: Gastroenterology;  Laterality: N/A;  . EYE SURGERY    . FLEXIBLE BRONCHOSCOPY N/A  05/19/2017   Procedure: FLEXIBLE BRONCHOSCOPY;  Surgeon: Wilhelmina Mcardle, MD;  Location: [facility] ORS;  Service: Pulmonary;  Laterality: N/A;  . open heart surgery  2004   PFO repair  . RIGHT/LEFT HEART CATH AND CORONARY ANGIOGRAPHY N/A 05/03/2018   Procedure: RIGHT/LEFT HEART CATH AND CORONARY ANGIOGRAPHY;  Surgeon: Nelva Bush, MD;  Location: [place] CV LAB;  Service: Cardiovascular;  Laterality: N/A;  . UPPER GI ENDOSCOPY       A IV Location/Drains/Wounds Patient Lines/Drains/Airways Status   Active Line/Drains/Airways    Name:   Placement date:   Placement time:   Site:   Days:   Peripheral IV 12/23/2018 Left Antecubital   01/03/2019    1310    Antecubital   less than 1   Peripheral IV 12/13/2018 Left;Upper Arm   12/21/2018    1325    Arm   less than 1          Intake/Output Last 24 hours  Intake/Output Summary (Last 24 hours) at 01/08/2019 1743 Last data filed at 12/11/2018 1740 Gross per 24 hour  Intake 350 ml  Output 350 ml  Net 0 ml    Labs/Imaging Results for orders placed or performed during the hospital encounter of 12/21/2018 (from the past 48 hour(s))  Lactic acid, plasma     Status: Abnormal   Collection Time: 12/16/2018  1:07 PM  Result Value Ref Range   Lactic Acid, Venous 2.0 (HH) 0.5 - 1.9 mmol/L    Comment: CRITICAL RESULT CALLED TO, READ BACK BY AND VERIFIED WITH KIM Amel Kitch ON 12/30/2018 AT 1346 QSD Performed at [facility], [address] [place], [place] [address]   Comprehensive metabolic panel     Status: Abnormal   Collection Time: 12/15/2018  1:08 PM  Result Value Ref Range   Sodium 135 135 - 145 mmol/L   Potassium 3.1 (L) 3.5 - 5.1 mmol/L   Chloride 92 (L) 98 - 111 mmol/L   CO2 33 (H) 22 - 32 mmol/L   Glucose, Bld 79 70 - 99 mg/dL   BUN 33 (H) 8 - 23 mg/dL   Creatinine, Ser 1.05 0.61 - 1.24 mg/dL   Calcium 8.6 (L) 8.9 - 10.3 mg/dL   Total Protein 7.0 6.5 - 8.1 g/dL   Albumin 3.4 (L) 3.5 - 5.0 g/dL   AST 41 15 - 41 U/L   ALT 34 0 - 44  U/L   Alkaline Phosphatase 80 38 - 126 U/L   Total Bilirubin 1.6 (H) 0.3 - 1.2 mg/dL   GFR calc non Af Amer >60 >60 mL/min   GFR calc Af Amer >60 >60 mL/min   Anion gap 10 5 - 15    Comment: Performed at [facility], [address] [place], [place] [address]  CBC with Differential     Status: Abnormal   Collection Time: 12/26/2018  1:08 PM  Result Value Ref Range   WBC 60.6 (HH) 4.0 - 10.5 K/uL    Comment: This critical result has verified  and been called to KIM Tattiana Fakhouri by Tennis Must on 05 23 2020 at 1437, and has been read back.    RBC 2.30 (L) 4.22 - 5.81 MIL/uL   Hemoglobin 8.1 (L) 13.0 - 17.0 g/dL   HCT 24.8 (L) 39.0 - 52.0 %   MCV 107.8 (H) 80.0 - 100.0 fL   MCH 35.2 (H) 26.0 - 34.0 pg   MCHC 32.7 30.0 - 36.0 g/dL   RDW 17.0 (H) 11.5 - 15.5 %   Platelets 12 (LL) 150 - 400 K/uL    Comment: Immature Platelet Fraction may be clinically indicated, consider ordering this additional test [id number] THIS CRITICAL RESULT HAS VERIFIED AND BEEN CALLED TO KIM Delma Villalva BY STEPHON CAPERS ON 05 23 2020 AT 1437, AND HAS BEEN READ BACK.     nRBC 0.4 (H) 0.0 - 0.2 %   Neutrophils Relative % 53 %   Neutro Abs 31.8 (H) 1.7 - 7.7 K/uL   Lymphocytes Relative 12 %   Lymphs Abs 7.5 (H) 0.7 - 4.0 K/uL   Monocytes Relative 23 %   Monocytes Absolute 13.9 (H) 0.1 - 1.0 K/uL   Eosinophils Relative 0 %   Eosinophils Absolute 0.0 0.0 - 0.5 K/uL   Basophils Relative 0 %   Basophils Absolute 0.2 (H) 0.0 - 0.1 K/uL   WBC Morphology      MODERATE LEFT SHIFT (>5% METAS AND MYELOS,OCC PRO NOTED)   RBC Morphology MORPHOLOGY UNREMARKABLE    Smear Review PLATELETS APPEAR DECREASED    Immature Granulocytes 12 %   Abs Immature Granulocytes 7.30 (H) 0.00 - 0.07 K/uL    Comment: Performed at [facility], [address] [place], [place] [address]  Protime-INR     Status: None   Collection Time: 12/13/2018  1:08 PM  Result Value Ref Range   Prothrombin Time 14.7 11.4 - 15.2 seconds   INR 1.2  0.8 - 1.2    Comment: (NOTE) INR goal varies based on device and disease states. Performed at [facility], [address] [place], [place] [address]   SARS Coronavirus 2 (CEPHEID- Performed in [facility] hospital lab), Hosp Order     Status: None   Collection Time: 12/24/2018  1:08 PM  Result Value Ref Range   SARS Coronavirus 2 NEGATIVE NEGATIVE    Comment: (NOTE) If result is NEGATIVE SARS-CoV-2 target nucleic acids are NOT DETECTED. The SARS-CoV-2 RNA is generally detectable in upper and lower  respiratory specimens during the acute phase of infection. The lowest  concentration of SARS-CoV-2 viral copies this assay can detect is 250  copies / mL. A negative result does not preclude SARS-CoV-2 infection  and should not be used as the sole basis for treatment or other  patient management decisions.  A negative result may occur with  improper specimen collection / handling, submission of specimen other  than nasopharyngeal swab, presence of viral mutation(s) within the  areas targeted by this assay, and inadequate number of viral copies  (<250 copies / mL). A negative result must be combined with clinical  observations, patient history, and epidemiological information. If result is POSITIVE SARS-CoV-2 target nucleic acids are DETECTED. The SARS-CoV-2 RNA is generally detectable in upper and lower  respiratory specimens dur ing the acute phase of infection.  Positive  results are indicative of active infection with SARS-CoV-2.  Clinical  correlation with patient history and other diagnostic information is  necessary to determine patient infection status.  Positive results do  not rule  out bacterial infection or co-infection with other viruses. If result is PRESUMPTIVE POSTIVE SARS-CoV-2 nucleic acids MAY BE PRESENT.   A presumptive positive result was obtained on the submitted specimen  and confirmed on repeat testing.  While 2019 novel coronavirus  (SARS-CoV-2) nucleic  acids may be present in the submitted sample  additional confirmatory testing may be necessary for epidemiological  and / or clinical management purposes  to differentiate between  SARS-CoV-2 and other Sarbecovirus currently known to infect humans.  If clinically indicated additional testing with an alternate test  methodology [phone]) is advised. The SARS-CoV-2 RNA is generally  detectable in upper and lower respiratory sp ecimens during the acute  phase of infection. The expected result is Negative. Fact Sheet for Patients:  StrictlyIdeas.no Fact Sheet for Healthcare Providers: BankingDealers.co.za This test is not yet approved or cleared by the [place] FDA and has been authorized for detection and/or diagnosis of SARS-CoV-2 by FDA under an Emergency Use Authorization (EUA).  This EUA will remain in effect (meaning this test can be used) for the duration of the COVID-19 declaration under Section 564(b)(1) of the Act, 21 U.S.C. section 360bbb-3(b)(1), unless the authorization is terminated or revoked sooner. Performed at [facility], [address] [place], [place] [address]   Brain natriuretic peptide     Status: Abnormal   Collection Time: 12/19/2018  1:08 PM  Result Value Ref Range   B Natriuretic Peptide 147.0 (H) 0.0 - 100.0 pg/mL    Comment: Performed at [facility], [place], [place] [address]  Lactic acid, plasma     Status: Abnormal   Collection Time: 12/15/2018  3:44 PM  Result Value Ref Range   Lactic Acid, Venous 2.0 (HH) 0.5 - 1.9 mmol/L    Comment: CRITICAL RESULT CALLED TO, READ BACK BY AND VERIFIED WITH KIM Lemmie Vanlanen AT 1626 12/13/2018.PMF Performed at [facility], [address] [place], [place] [address]   Urinalysis, Complete w Microscopic     Status: Abnormal   Collection Time: 12/11/2018  3:44 PM  Result Value Ref Range   Color, Urine YELLOW (A) YELLOW   APPearance  HAZY (A) CLEAR   Specific Gravity, Urine 1.018 1.005 - 1.030   pH 5.0 5.0 - 8.0   Glucose, UA 50 (A) NEGATIVE mg/dL   Hgb urine dipstick NEGATIVE NEGATIVE   Bilirubin Urine NEGATIVE NEGATIVE   Ketones, ur NEGATIVE NEGATIVE mg/dL   Protein, ur 30 (A) NEGATIVE mg/dL   Nitrite NEGATIVE NEGATIVE   Leukocytes,Ua NEGATIVE NEGATIVE   RBC / HPF 0-5 0 - 5 RBC/hpf   WBC, UA 0-5 0 - 5 WBC/hpf   Bacteria, UA NONE SEEN NONE SEEN   Squamous Epithelial / LPF NONE SEEN 0 - 5   Mucus PRESENT     Comment: Performed at [facility], [address] [place], [place] [address]   Ct Angio Chest Pe W And/or Wo Contrast  Result Date: 12/19/2018 CLINICAL DATA:  Worsening cough and shortness of breath. Recent hospitalization for pneumonia. EXAM: CT ANGIOGRAPHY CHEST WITH CONTRAST TECHNIQUE: Multidetector CT imaging of the chest was performed using the standard protocol during bolus administration of intravenous contrast. Multiplanar CT image reconstructions and MIPs were obtained to evaluate the vascular anatomy. CONTRAST:  91m OMNIPAQUE IOHEXOL 350 MG/ML SOLN COMPARISON:  Radiographs today.  CT 11/23/2018 and 05/13/2017. FINDINGS: Cardiovascular: The pulmonary arteries are well opacified with contrast to the level of the subsegmental branches. There is no evidence of acute pulmonary  embolism. Extensive coronary artery atherosclerosis again noted with lesser involvement the aorta and great vessels. No acute vascular findings. There are calcifications the aortic valve. The heart size is normal. There is no pericardial effusion. Mediastinum/Nodes: There are no enlarged mediastinal, hilar or axillary lymph nodes.Stable small mediastinal lymph nodes. The thyroid gland, trachea and esophagus appear unchanged. Lungs/Pleura: There is no pleural effusion or pneumothorax. 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 There are moderate emphysematous changes. The left lower lobe consolidation seen on the most recent study has improved, although there are new patchy ground-glass and airspace opacities throughout both lower lobes and the lingula. These demonstrate a nonspecific distribution. Upper abdomen: The visualized upper abdomen appears stable without suspicious findings. Borderline hepatic steatosis. Musculoskeletal/Chest wall: There is no chest wall mass or suspicious osseous finding. Review of the MIP images confirms the above findings. IMPRESSION: 1. No evidence of acute pulmonary embolism. 2. The left lower lobe consolidation seen on the most recent study has improved. However, there are new patchy ground-glass and airspace opacities in both lower lobes and the lingula. These have a nonspecific distribution and could reflect bacterial or viral infection. COVID-19 testing today was negative. 3. Underlying chronic interstitial lung disease, likely due to a combination emphysema and other smoking related lung disease such as Langerhans cell histiocytosis as correlated with prior studies. 4. Coronary and Aortic Atherosclerosis (ICD10-I70.0). Electronically Signed   By: Richardean Sale M.D.   On: 01/03/2019 15:46   Dg Chest Portable 1 View  Result Date: 12/13/2018 CLINICAL DATA:  Cough and worsening shortness of breath. EXAM: PORTABLE CHEST 1 VIEW COMPARISON:  12/17/2018 FINDINGS: Prior median sternotomy is again noted. The cardiomediastinal silhouette is unchanged with normal heart size. Chronic interstitial densities throughout both lungs are unchanged. No acute airspace consolidation, overt pulmonary edema, pleural effusion, pneumothorax is identified. No acute osseous abnormality is seen. IMPRESSION: Chronic interstitial lung disease without evidence of acute abnormality. Electronically Signed   By: Logan Bores M.D.   On: 12/14/2018 13:22    Pending Labs Unresulted Labs (From  admission, onward)    Start     Ordered   01/01/19 5366  Basic metabolic panel  Tomorrow morning,   STAT     12/20/2018 1726   01/01/19 0500  CBC  Tomorrow morning,   STAT     12/16/2018 1726   12/09/2018 1625  MRSA PCR Screening  Once,   STAT     12/27/2018 1625   12/23/2018 1308  Pathologist smear review  Once,   STAT     01/01/2019 1308   12/15/2018 1307  Culture, blood (Routine x 2)  BLOOD CULTURE X 2,   STAT     12/12/2018 1307          Vitals/Pain Today's Vitals   12/26/2018 1305 12/21/2018 1441 01/02/2019 1458 12/20/2018 1621  BP:  (!) 98/42 (!) 105/39 (!) 113/49  Pulse:  78 78 80  Resp:  20 (!) 22 (!) 22  Temp:      TempSrc:      SpO2:  91% 98% 93%  Weight: 111.6 kg     Height: _0  (1.803 m)     PainSc:  Asleep Asleep 0-No pain    Isolation Precautions Droplet and Contact precautions  Medications Medications  aspirin chewable tablet 81 mg (has no administration in time range)  atorvastatin (LIPITOR) tablet 40 mg (  has no administration in time range)  carvedilol (COREG) tablet 12.5 mg (has no administration in time range)  ivabradine (CORLANOR) tablet 5 mg (has no administration in time range)  losartan (COZAAR) tablet 12.5 mg (has no administration in time range)  spironolactone (ALDACTONE) tablet 25 mg (has no administration in time range)  torsemide (DEMADEX) tablet 40 mg (has no administration in time range)  LORazepam (ATIVAN) tablet 0.5 mg (has no administration in time range)  pantoprazole (PROTONIX) EC tablet 40 mg (has no administration in time range)  gabapentin (NEURONTIN) capsule 300 mg (has no administration in time range)  tiZANidine (ZANAFLEX) tablet 4 mg (has no administration in time range)  Vitamin D CAPS 2,000 Units (has no administration in time range)  multivitamin with minerals tablet 1 tablet (has no administration in time range)  vitamin C (ASCORBIC ACID) tablet 500 mg (has no administration in time range)  benzonatate (TESSALON) capsule 200 mg (has no  administration in time range)  loratadine (CLARITIN) tablet 10 mg (has no administration in time range)  acetaminophen (TYLENOL) tablet 650 mg (has no administration in time range)    Or  acetaminophen (TYLENOL) suppository 650 mg (has no administration in time range)  ondansetron (ZOFRAN) tablet 4 mg (has no administration in time range)    Or  ondansetron (ZOFRAN) injection 4 mg (has no administration in time range)  ipratropium-albuterol (DUONEB) 0.5-2.5 (3) MG/3ML nebulizer solution 3 mL (has no administration in time range)  budesonide (PULMICORT) nebulizer solution 0.5 mg (has no administration in time range)  methylPREDNISolone sodium succinate (SOLU-MEDROL) 40 mg/mL injection 40 mg (has no administration in time range)  insulin aspart (novoLOG) injection 0-9 Units (has no administration in time range)  insulin aspart (novoLOG) injection 0-5 Units (has no administration in time range)  vancomycin (VANCOCIN) 1,500 mg in sodium chloride 0.9 % 500 mL IVPB (has no administration in time range)  vancomycin (VANCOCIN) 1,750 mg in sodium chloride 0.9 % 500 mL IVPB (has no administration in time range)  ceFEPIme (MAXIPIME) 2 g in sodium chloride 0.9 % 100 mL IVPB (has no administration in time range)  ceFEPIme (MAXIPIME) 1 g in sodium chloride 0.9 % 100 mL IVPB (has no administration in time range)  sodium chloride flush (NS) 0.9 % injection 3 mL (3 mLs Intravenous Given 12/21/2018 1556)  iohexol (OMNIPAQUE) 350 MG/ML injection 75 mL (75 mLs Intravenous Contrast Given 12/26/2018 1513)  ipratropium-albuterol (DUONEB) 0.5-2.5 (3) MG/3ML nebulizer solution 3 mL (3 mLs Nebulization Given 12/25/2018 1555)  ipratropium-albuterol (DUONEB) 0.5-2.5 (3) MG/3ML nebulizer solution 3 mL (3 mLs Nebulization Given 12/30/2018 1555)  ipratropium-albuterol (DUONEB) 0.5-2.5 (3) MG/3ML nebulizer solution 3 mL (3 mLs Nebulization Given 12/14/2018 1555)  methylPREDNISolone sodium succinate (SOLU-MEDROL) 125 mg/2 mL injection 125 mg  (125 mg Intravenous Given 01/05/2019 1555)    Mobility walks with device Low fall risk   Focused Assessments Pulmonary Assessment Handoff:  Lung sounds:   O2 Device: Nasal Cannula O2 Flow Rate (L/min): 6 L/min      R Recommendations: See Admitting Provider Note  Report given to:   Additional Notes: congested cough, home 02 5l, now 6l with SAT 90 to 93

## 2018-12-31 NOTE — Consult Note (Addendum)
Pharmacy Antibiotic Note  Tony Watson is a 68 y.o. male admitted on 01/07/2019 with pneumonia. Patient has PMH significant for COPD (on 5 L oxygen at home), CML, and chronic respiratory failure. Patient was recently discharged for Pneumonia (treated with cefepime + vancomycin). Was also treated in 11/2018 with serratia and s. Aureus in respiratory culture. Pharmacy has been consulted for Vancomycin + Cefepime dosing.  Plan: Patient received Cefepime 1 g in ED. Ordered an additional dose of 1 g for this evening. Will order Cefepime 2 g q8h starting tomorrow AM.   Patient received Vancomycin 1 g x 1 in ED. Ordered a 1500 mg dose for a total load of 2500 mg.  Maintenance Vancomycin Dosing Ordered Vancomycin 1750 mg q24h  Using Scr 1.05, Vd 0.5 (BMI > 30) Will order 1750 mg IV q24h Goal AUC 400-550 Anticipated AUC 484.4   Height: 5\' 11"  (180.3 cm) Weight: 246 lb (111.6 kg) IBW/kg (Calculated) : 75.3  Temp (24hrs), Avg:99.8 F (37.7 C), Min:99.8 F (37.7 C), Max:99.8 F (37.7 C)  Recent Labs  Lab 12/19/2018 1307 01/05/2019 1308  WBC  --  60.6*  CREATININE  --  1.05  LATICACIDVEN 2.0*  --     Estimated Creatinine Clearance: 86.7 mL/min (by C-G formula based on SCr of 1.05 mg/dL).    Allergies  Allergen Reactions  . Prilosec [Omeprazole] Diarrhea    Antimicrobials this admission: 5/23 Vancomycin >>  5/23 Cefepime >>   Dose adjustments this admission: N/A  Microbiology results: 5/23 BCx: pending  5/23 MRSA PCR: sent 5/23 COVID-19 (-)  Thank you for allowing pharmacy to be a part of this patient's care.   Paticia Stack, PharmD Pharmacy Resident  01/03/2019 4:41 PM

## 2018-12-31 NOTE — H&P (Addendum)
[facility] [place] NAME: Tony Watson    MR#:  [id number]  DATE OF BIRTH:  1950-10-08  DATE OF ADMISSION:  12/29/2018  PRIMARY CARE PHYSICIAN: Owens Loffler, MD   REQUESTING/REFERRING PHYSICIAN: Dr. Gonzella Lex  CHIEF COMPLAINT:   Chief Complaint  Patient presents with   Shortness of Breath    HISTORY OF PRESENT ILLNESS:  Tony Watson  is a 68 y.o. male with a known history of CML, COPD, interstitial lung disease, chronic respiratory failure, diabetes, hypertension, spinal stenosis, GERD, diabetic neuropathy who presents to the hospital due to worsening shortness of breath and hypoxemia.  Patient was recently hospitalized and discharged about 10 days ago and treated for similar issues including acute on chronic respiratory failure with hypoxia secondary to pneumonia and COPD exacerbation and discharged on some oral antibiotics, now returns back due to worsening shortness of breath.  Patient says he was feeling better at home until about 4 to 5 days ago he developed worsening shortness of breath even at rest and worse with minimal exertion, his O2 requirements at home has gone up from 5 L to 8 L.  He admits to a cough which is productive with yellow-green sputum but denies any fevers, chills, chest pains, nausea or vomiting.  He presents to the emergency room was noted to be acutely hypoxemic with O2 sats in the mid to low 60s on room air and even on 5 L patient was significantly hypoxemic.  Patient CT chest shows pneumonia and hospitalist services were contacted for admission.  PAST MEDICAL HISTORY:   Past Medical History:  Diagnosis Date   Arthritis    CAD (coronary artery disease)    a. inf MI 11/99 (in [place]) w/ PCI to [facility]; b. LHC 2004 [facility]): EF 50%, patent RCA stent, no obs dz; c. MV 2008: EF 42%, inf infarct, no ischemia; d. R/LHC 9/19: 3-v dz w/ mild dif dz LM, m-dLAD 50, dLAD 60, D2 80, RI 80, mRCA-1 90, mRCA-2 20,  patent RCA stent, CO/CI 3.7/2   Chronic combined systolic (congestive) and diastolic (congestive) heart failure (HCC)    a. 7/29019 Echo: EF 25-30%, sev diff HK, mild MR mod dil LA, mildly reduced RVSF, elevated CVP   Chronic respiratory failure with hypoxia (HCC)    a. on 2L supplemental oxygen via [place] followed by pulmonology   CML (chronic myelocytic leukemia) (HCC)    COPD (chronic obstructive pulmonary disease) ([place])    Diabetes mellitus without complication ([place])    Diabetic autonomic neuropathy associated with type 2 diabetes mellitus ([place]) 03/27/2015   Diverticulosis 2002   GERD (gastroesophageal reflux disease)    HTN (hypertension)    Hyperlipidemia    ILD (interstitial lung disease) ([place])    Ischemic cardiomyopathy    Medical non-compliance    Smoker    Spinal stenosis    TIA (transient ischemic attack)    a. s/p PFO closure 2004 (in [place])   Ulcer     PAST SURGICAL HISTORY:   Past Surgical History:  Procedure Laterality Date   BACK SURGERY     patient denies-just lumbar punctures   BRONCHOSCOPY     CARDIAC CATHETERIZATION  11/99   CI per PMH   CATARACT EXTRACTION W/PHACO Right 11/24/2016   Procedure: CATARACT EXTRACTION PHACO AND INTRAOCULAR LENS PLACEMENT ([place]);  Surgeon: Birder Robson, MD;  Location: [facility] ORS;  Service: Ophthalmology;  Laterality: Right;  [place] 1:04.3 AP% 22.3 CDE 14.35 Fluid pack lot #  [id number]   CATARACT EXTRACTION W/PHACO Left 12/29/2016   Procedure: CATARACT EXTRACTION PHACO AND INTRAOCULAR LENS PLACEMENT (IOC);  Surgeon: Birder Robson, MD;  Location: [facility] ORS;  Service: Ophthalmology;  Laterality: Left;  [place] 00:52 AP% 18.5 CDE 9.67 Fluid pack lot # [id number]   COLONOSCOPY     COLONOSCOPY WITH PROPOFOL N/A 05/25/2017   Procedure: COLONOSCOPY WITH PROPOFOL;  Surgeon: Jonathon Bellows, MD;  Location: [facility] ENDOSCOPY;  Service: Gastroenterology;  Laterality: N/A;   CORONARY ANGIOPLASTY     STENT   CORONARY ARTERY BYPASS  GRAFT     stent   ESOPHAGOGASTRODUODENOSCOPY (EGD) WITH PROPOFOL N/A 05/25/2017   Procedure: ESOPHAGOGASTRODUODENOSCOPY (EGD) WITH PROPOFOL;  Surgeon: Jonathon Bellows, MD;  Location: [facility] ENDOSCOPY;  Service: Gastroenterology;  Laterality: N/A;   EYE SURGERY     FLEXIBLE BRONCHOSCOPY N/A 05/19/2017   Procedure: FLEXIBLE BRONCHOSCOPY;  Surgeon: Wilhelmina Mcardle, MD;  Location: [facility] ORS;  Service: Pulmonary;  Laterality: N/A;   open heart surgery  2004   PFO repair   RIGHT/LEFT HEART CATH AND CORONARY ANGIOGRAPHY N/A 05/03/2018   Procedure: RIGHT/LEFT HEART CATH AND CORONARY ANGIOGRAPHY;  Surgeon: Nelva Bush, MD;  Location: [facility];  Service: Cardiovascular;  Laterality: N/A;   UPPER GI ENDOSCOPY      SOCIAL HISTORY:   Social History   Tobacco Use   Smoking status: Former Smoker    Packs/day: 0.50    Years: 45.00    Pack years: 22.50    Types: Cigarettes    Last attempt to quit: 01/25/2018    Years since quitting: 0.9   Smokeless tobacco: Never Used   Tobacco comment: 1 ppd +40 years  Substance Use Topics   Alcohol use: Not Currently    Alcohol/week: 0.0 standard drinks    Comment: weekly but last dose 1 month    FAMILY HISTORY:   Family History  Problem Relation Age of Onset   Coronary artery disease Other        family hx   Breast cancer Other        1st egree relative <50   Cancer Mother    Heart disease Father     DRUG ALLERGIES:   Allergies  Allergen Reactions   Prilosec [Omeprazole] Diarrhea    REVIEW OF SYSTEMS:   Review of Systems  Constitutional: Negative for fever and weight loss.  HENT: Negative for congestion, nosebleeds and tinnitus.   Eyes: Negative for blurred vision, double vision and redness.  Respiratory: Positive for cough, sputum production, shortness of breath and wheezing. Negative for hemoptysis.   Cardiovascular: Negative for chest pain, orthopnea, leg swelling and PND.  Gastrointestinal: Negative for  abdominal pain, diarrhea, melena, nausea and vomiting.  Genitourinary: Negative for dysuria, hematuria and urgency.  Musculoskeletal: Negative for falls and joint pain.  Neurological: Negative for dizziness, tingling, sensory change, focal weakness, seizures, weakness and headaches.  Endo/Heme/Allergies: Negative for polydipsia. Does not bruise/bleed easily.  Psychiatric/Behavioral: Negative for depression and memory loss. The patient is not nervous/anxious.     MEDICATIONS AT HOME:   Prior to Admission medications   Medication Sig Start Date End Date Taking? Authorizing Provider  acetaminophen (TYLENOL) 500 MG tablet Take 1,000 mg by mouth daily as needed for moderate pain or headache.    [provider]  aspirin 81 MG chewable tablet Chew 1 tablet (81 mg total) by mouth daily. 05/09/18   Loletha Grayer, MD  atorvastatin (LIPITOR) 40 MG tablet TAKE 1 TABLET (40 MG TOTAL) BY MOUTH DAILY  AT 6 PM. 12/01/18   Minna Merritts, MD  BD INSULIN SYRINGE U/F 31G X 5/16" 1 ML MISC  12/21/18   [provider]  benzonatate (TESSALON) 200 MG capsule Take 1 capsule (200 mg total) by mouth 3 (three) times daily. 11/28/18   Dustin Flock, MD  benzonatate (TESSALON) 200 MG capsule Take 1 capsule (200 mg total) by mouth 3 (three) times daily as needed for cough. 12/26/18 12/26/19  Owens Loffler, MD  blood glucose meter kit and supplies Dispense based on patient and insurance preference. Use to check blood sugar daily. 11/16/18   Copland, Frederico Hamman, MD  budesonide (PULMICORT) 0.5 MG/2ML nebulizer solution Take 2 mLs (0.5 mg total) by nebulization 2 (two) times daily. 03/06/18 03/06/19  Vaughan Basta, MD  carvedilol (COREG) 12.5 MG tablet Take 1 tablet (12.5 mg total) by mouth 2 (two) times daily. 11/14/18 02/12/19  Minna Merritts, MD  cetirizine (ZYRTEC) 10 MG chewable tablet Chew 10 mg by mouth daily.    [provider]  Cholecalciferol (VITAMIN D) 2000 units CAPS Take 2,000 Units  by mouth every evening.    [provider]  formoterol (PERFOROMIST) 20 MCG/2ML nebulizer solution Take 2 mLs (20 mcg total) by nebulization 2 (two) times daily. J44.9 06/10/18   [name], MD  gabapentin (NEURONTIN) 300 MG capsule Take 1 capsule (300 mg total) by mouth at bedtime. 11/03/18   Copland, Frederico Hamman, MD  guaiFENesin-codeine 100-10 MG/5ML syrup Take 5 mLs by mouth at bedtime as needed for cough. 12/26/18   Copland, Frederico Hamman, MD  insulin aspart (NOVOLOG) 100 UNIT/ML injection 5 units subcutaneous prior to meals (okay to substitute generic or what is covered by [employer]) Patient taking differently: 8 units subcutaneous prior to meals (okay to substitute generic or what is covered by Insurance) 12/21/18   Loletha Grayer, MD  insulin glargine (LANTUS) 100 UNIT/ML injection Inject 0.2 mLs (20 Units total) into the skin daily. Patient taking differently: Inject 25 Units into the skin daily.  12/22/18   Wieting, Richard, MD  ipratropium-albuterol (DUONEB) 0.5-2.5 (3) MG/3ML SOLN Take 3 mLs by nebulization every 4 (four) hours as needed. DX:J44.9 Patient taking differently: Take 3 mLs by nebulization every 4 (four) hours as needed (for shortness of breath/wheezing).  11/04/18   [name], MD  ivabradine (CORLANOR) 5 MG TABS tablet Take 1 tablet (5 mg total) by mouth 2 (two) times daily with a meal. 08/08/18   Alisa Graff, FNP  LORazepam (ATIVAN) 0.5 MG tablet Take 1 tablet (0.5 mg total) by mouth 3 (three) times daily as needed for anxiety. 11/11/18   Copland, Frederico Hamman, MD  losartan (COZAAR) 25 MG tablet Take 0.5 tablets (12.5 mg total) by mouth daily. 11/14/18   Minna Merritts, MD  Multiple Vitamin (MULTIVITAMIN WITH MINERALS) TABS tablet Take 1 tablet by mouth daily. 03/07/18   Vaughan Basta, MD  nystatin (MYCOSTATIN) 100000 UNIT/ML suspension TAKE AS DIRECTED 5 MLS (500,000 UNITS TOTAL) IN THE MOUTH OR THROAT 4 TIMES DAILY Patient taking differently: Use as directed 5 mLs in  the mouth or throat 4 (four) times daily.  10/12/18   Copland, Frederico Hamman, MD  omeprazole (PRILOSEC) 20 MG capsule Take 20 mg by mouth daily.    [provider]  ondansetron (ZOFRAN ODT) 4 MG disintegrating tablet Take 1-2 tablets (4-8 mg total) by mouth every 6 (six) hours as needed for nausea or vomiting. 11/16/18   Copland, Frederico Hamman, MD  predniSONE (DELTASONE) 5 MG tablet 6 tabs po daily for 3  days; 5 tabs po daily for three days; 4 tabs po daily for three days; 3 tabs po daily afterwards 12/21/18   Loletha Grayer, MD  spironolactone (ALDACTONE) 25 MG tablet TAKE 1 TABLET BY MOUTH EVERY DAY 12/01/18   Minna Merritts, MD  tiZANidine (ZANAFLEX) 4 MG tablet Take 1 tablet (4 mg total) by mouth Nightly. 03/23/18   Copland, Frederico Hamman, MD  torsemide (DEMADEX) 20 MG tablet Take 2 tablets (40 mg total) by mouth daily. May also take 1 tablet (20 mg total) as needed (AS NEEDED FOR SWELLING). 12/29/18 03/29/19  Minna Merritts, MD  vitamin C (ASCORBIC ACID) 500 MG tablet Take 500 mg by mouth daily.    [provider]      VITAL SIGNS:  Blood pressure (!) 105/39, pulse 78, temperature 99.8 F (37.7 C), temperature source Oral, resp. rate (!) 22, height _0  (1.803 m), weight 111.6 kg, SpO2 98 %.  PHYSICAL EXAMINATION:  Physical Exam  GENERAL:  68 y.o.-year-old patient lying in the bed in mild to moderate Resp. Distress.  EYES: Pupils equal, round, reactive to light and accommodation. No scleral icterus. Extraocular muscles intact.  HEENT: Head atraumatic, normocephalic. Oropharynx and nasopharynx clear. No oropharyngeal erythema, moist oral mucosa  NECK:  Supple, no jugular venous distention. No thyroid enlargement, no tenderness.  LUNGS: Good air entry bilaterally, diffuse wheezing, rhonchi bilaterally.  Positive use of accessory muscles.  No dullness to percussion. CARDIOVASCULAR: S1, S2 RRR. No murmurs, rubs, gallops, clicks.  ABDOMEN: Soft, nontender, nondistended. Bowel sounds present.  No organomegaly or mass.  EXTREMITIES: No pedal edema, cyanosis, or clubbing. + 2 pedal & radial pulses b/l.   NEUROLOGIC: Cranial nerves II through XII are intact. No focal Motor or sensory deficits appreciated b/l. Globally weak.  PSYCHIATRIC: The patient is alert and oriented x 3. Lethargic.  SKIN: No obvious rash, lesion, or ulcer.   LABORATORY PANEL:   CBC Recent Labs  Lab 12/30/2018 1308  WBC 60.6*  HGB 8.1*  HCT 24.8*  PLT 12*   ------------------------------------------------------------------------------------------------------------------  Chemistries  Recent Labs  Lab 12/26/2018 1308  NA 135  K 3.1*  CL 92*  CO2 33*  GLUCOSE 79  BUN 33*  CREATININE 1.05  CALCIUM 8.6*  AST 41  ALT 34  ALKPHOS 80  BILITOT 1.6*   ------------------------------------------------------------------------------------------------------------------  Cardiac Enzymes No results for input(s): TROPONINI in the last 168 hours. ------------------------------------------------------------------------------------------------------------------  RADIOLOGY:  Ct Angio Chest Pe W And/or Wo Contrast  Result Date: 12/21/2018 CLINICAL DATA:  Worsening cough and shortness of breath. Recent hospitalization for pneumonia. EXAM: CT ANGIOGRAPHY CHEST WITH CONTRAST TECHNIQUE: Multidetector CT imaging of the chest was performed using the standard protocol during bolus administration of intravenous contrast. Multiplanar CT image reconstructions and MIPs were obtained to evaluate the vascular anatomy. CONTRAST:  76m OMNIPAQUE IOHEXOL 350 MG/ML SOLN COMPARISON:  Radiographs today.  CT 11/23/2018 and 05/13/2017. FINDINGS: Cardiovascular: The pulmonary arteries are well opacified with contrast to the level of the subsegmental branches. There is no evidence of acute pulmonary embolism. Extensive coronary artery atherosclerosis again noted with lesser involvement the aorta and great vessels. No acute vascular findings.  There are calcifications the aortic valve. The heart size is normal. There is no pericardial effusion. Mediastinum/Nodes: There are no enlarged mediastinal, hilar or axillary lymph nodes.Stable small mediastinal lymph nodes. The thyroid gland, trachea and esophagus appear unchanged. Lungs/Pleura: There is no pleural effusion or pneumothorax. 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 There are moderate emphysematous changes. The left lower lobe consolidation seen on the most recent study has improved, although there are new patchy ground-glass and airspace opacities throughout both lower lobes and the lingula. These demonstrate a nonspecific distribution. Upper abdomen: The visualized upper abdomen appears stable without suspicious findings. Borderline hepatic steatosis. Musculoskeletal/Chest wall: There is no chest wall mass or suspicious osseous finding. Review of the MIP images confirms the above findings. IMPRESSION: 1. No evidence of acute pulmonary embolism. 2. The left lower lobe consolidation seen on the most recent study has improved. However, there are new patchy ground-glass and airspace opacities in both lower lobes and the lingula. These have a nonspecific distribution and could reflect bacterial or viral infection. COVID-19 testing today was negative. 3. Underlying chronic interstitial lung disease, likely due to a combination emphysema and other smoking related lung disease such as Langerhans cell histiocytosis as correlated with prior studies. 4. Coronary and Aortic Atherosclerosis (ICD10-I70.0). Electronically Signed   By: Richardean Sale M.D.   On: 12/27/2018 15:46   Dg Chest Portable 1 View  Result Date: 12/15/2018 CLINICAL DATA:  Cough and worsening shortness of breath. EXAM: PORTABLE CHEST 1 VIEW COMPARISON:  12/17/2018 FINDINGS: Prior median sternotomy is  again noted. The cardiomediastinal silhouette is unchanged with normal heart size. Chronic interstitial densities throughout both lungs are unchanged. No acute airspace consolidation, overt pulmonary edema, pleural effusion, pneumothorax is identified. No acute osseous abnormality is seen. IMPRESSION: Chronic interstitial lung disease without evidence of acute abnormality. Electronically Signed   By: Logan Bores M.D.   On: 12/26/2018 13:22     IMPRESSION AND PLAN:   68 year old male with past medical history of COPD, interstitial lung disease, diabetes, diabetic neuropathy, hypertension, recent admission for sepsis/respiratory failure with pneumonia who presents to the hospital due to worsening shortness of breath and hypoxemia.  1.  Acute on chronic respiratory failure with hypoxia- secondary to pneumonia with underlying COPD/interstitial lung disease exacerbation. - Patient's O2 requirements at home had increased from 5 to 8 L and he was noted to be significantly hypoxemic in the ER. - Continue O2 supplementation, will treat underlying pneumonia and COPD with broad-spectrum IV antibiotics with vancomycin, cefepime.  Give the patient IV steroids, scheduled duo nebs, Pulmicort nebs for his COPD. - Wean off oxygen as tolerated.  2.  COPD/interstitial lung disease exacerbation- secondary to pneumonia. - We will treat patient with IV steroids, scheduled duo nebs, Pulmicort nebs.  Continue empiric antibiotics with vancomycin, cefepime.  Follow cultures.  3.  Pneumonia-source of patient's worsening respiratory failure and COPD exacerbation. -Continue IV vancomycin, cefepime.  Follow cultures.  Patient's COVID-19 test was negative.  4.  Leukocytosis-secondary to his underlying CML it is somewhat worse than his baseline. -Follow white cell count with IV antibiotic therapy.  5.  Thrombocytopenia-secondary to underlying CML. - Hold off heparin products, follow platelet count.  No acute bleeding.     6.  Diabetes type 2 with neuropathy- continue patient's Lantus, sliding scale insulin. -Follow blood sugars.  7.  History of CML-patient follows with Dr. Grayland Ormond. -Continue Corlanor.   8.  Diabetic neuropathy-continue gabapentin.  9.  Essential hypertension-continue carvedilol, losartan, Aldactone.  10.  Hyperlipidemia-continue atorvastatin.  11.  GERD-continue Protonix.  Patient's prognosis is guarded to poor given his recurrent hospitalizations and multiple comorbidities.  Patient is a DNR.  Consider palliative care consult for goals of care.  All the records are reviewed  and case discussed with ED provider. Management plans discussed with the patient, family and they are in agreement.  CODE STATUS: DNR  TOTAL TIME TAKING CARE OF THIS PATIENT: 45 minutes.    Henreitta Leber M.D on 01/07/2019 at 4:22 PM  Between 7am to 6pm - Pager - [phone]  After 6pm go to [email or web address] - password [email or web address] [place] [facility]  Office  [phone]  CC: Primary care physician; Owens Loffler, MD

## 2018-12-31 NOTE — Progress Notes (Signed)
Flutter valve initiated for rhonchi/airway congestion.

## 2018-12-31 NOTE — ED Provider Notes (Signed)
[facility] Emergency Department Provider Note  ____________________________________________  Time seen: Approximately 2:40 PM  I have reviewed the triage vital signs and the nursing notes.   HISTORY  Chief Complaint Shortness of Breath   HPI Tony Watson is a 68 y.o. male with a history of COPD on 5 L nasal cannula, CHF with a EF of 30%, CAD, diabetes, hypertension, hyperlipidemia, CML who presents for evaluation of shortness of breath.  Patient discharged from the hospital 10 days ago after being admitted for pneumonia and COPD.  Has finished antibiotics.  Started feeling worse 3 days ago.  Cough productive of brown-green sputum with severe constant shortness of breath.  No chest pain, no nausea, no vomiting, no diarrhea, no fever.  Patient has noticed worsening swelling of his lower extremities.  Has been taking his Lasix at home.  Past Medical History:  Diagnosis Date   Arthritis    CAD (coronary artery disease)    a. inf MI 11/99 (in [place]) w/ PCI to [facility]; b. LHC 2004 [facility]): EF 50%, patent RCA stent, no obs dz; c. MV 2008: EF 42%, inf infarct, no ischemia; d. R/LHC 9/19: 3-v dz w/ mild dif dz LM, m-dLAD 50, dLAD 60, D2 80, RI 80, mRCA-1 90, mRCA-2 20, patent RCA stent, CO/CI 3.7/2   Chronic combined systolic (congestive) and diastolic (congestive) heart failure (HCC)    a. 7/29019 Echo: EF 25-30%, sev diff HK, mild MR mod dil LA, mildly reduced RVSF, elevated CVP   Chronic respiratory failure with hypoxia (HCC)    a. on 2L supplemental oxygen via [place] followed by pulmonology   CML (chronic myelocytic leukemia) (HCC)    COPD (chronic obstructive pulmonary disease) ([place])    Diabetes mellitus without complication ([place])    Diabetic autonomic neuropathy associated with type 2 diabetes mellitus ([place]) 03/27/2015   Diverticulosis 2002   GERD (gastroesophageal reflux disease)    HTN (hypertension)    Hyperlipidemia    ILD (interstitial lung  disease) ([place])    Ischemic cardiomyopathy    Medical non-compliance    Smoker    Spinal stenosis    TIA (transient ischemic attack)    a. 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 ([place]) 01/27/2016   Diabetic autonomic neuropathy associated with type 2 diabetes mellitus ([place]) 03/27/2015   History of inferior MI (myocardial infarction) 10/14/2013   CAD (coronary artery disease), native coronary artery    History of noncompliance with medical treatment 05/30/2012   Stage 4 very severe COPD by GOLD classification ([place]) 03/23/2011   ALLERGIC RHINITIS CAUSE UNSPECIFIED 07/31/2010   Type 2 diabetes mellitus with vascular disease ([place]) 01/02/2010   Ex-smoker 04/17/2009   TRANSIENT ISCHEMIC ATTACKS, HX OF 04/17/2009   Hyperlipidemia 12/19/2008    Past Surgical History:  Procedure Laterality Date   BACK SURGERY     patient denies-just lumbar punctures   BRONCHOSCOPY     CARDIAC CATHETERIZATION  11/99   CI per PMH   CATARACT EXTRACTION W/PHACO Right 11/24/2016   Procedure: CATARACT  EXTRACTION PHACO AND INTRAOCULAR LENS PLACEMENT (IOC);  Surgeon: Birder Robson, MD;  Location: [facility] ORS;  Service: Ophthalmology;  Laterality: Right;  [place] 1:04.3 AP% 22.3 CDE 14.35 Fluid pack lot # [id number]   CATARACT EXTRACTION W/PHACO Left 12/29/2016   Procedure: CATARACT EXTRACTION PHACO AND INTRAOCULAR LENS PLACEMENT (IOC);  Surgeon: Birder Robson, MD;  Location: [facility] ORS;  Service: Ophthalmology;  Laterality:  Left;  [place] 00:52 AP% 18.5 CDE 9.67 Fluid pack lot # [id number]   COLONOSCOPY     COLONOSCOPY WITH PROPOFOL N/A 05/25/2017   Procedure: COLONOSCOPY WITH PROPOFOL;  Surgeon: Jonathon Bellows, MD;  Location: [facility] ENDOSCOPY;  Service: Gastroenterology;  Laterality: N/A;   CORONARY ANGIOPLASTY     STENT   CORONARY ARTERY BYPASS GRAFT     stent   ESOPHAGOGASTRODUODENOSCOPY (EGD) WITH PROPOFOL N/A 05/25/2017   Procedure: ESOPHAGOGASTRODUODENOSCOPY (EGD) WITH PROPOFOL;  Surgeon: Jonathon Bellows, MD;  Location: [facility] ENDOSCOPY;  Service: Gastroenterology;  Laterality: N/A;   EYE SURGERY     FLEXIBLE BRONCHOSCOPY N/A 05/19/2017   Procedure: FLEXIBLE BRONCHOSCOPY;  Surgeon: Wilhelmina Mcardle, MD;  Location: [facility] ORS;  Service: Pulmonary;  Laterality: N/A;   open heart surgery  2004   PFO repair   RIGHT/LEFT HEART CATH AND CORONARY ANGIOGRAPHY N/A 05/03/2018   Procedure: RIGHT/LEFT HEART CATH AND CORONARY ANGIOGRAPHY;  Surgeon: Nelva Bush, MD;  Location: [place] CV LAB;  Service: Cardiovascular;  Laterality: N/A;   UPPER GI ENDOSCOPY      Prior to Admission medications   Medication Sig Start Date End Date Taking? Authorizing Provider  acetaminophen (TYLENOL) 500 MG tablet Take 1,000 mg by mouth daily as needed for moderate pain or headache.    [provider]  aspirin 81 MG chewable tablet Chew 1 tablet (81 mg total) by mouth daily. 05/09/18   Loletha Grayer, MD  atorvastatin (LIPITOR) 40 MG tablet TAKE 1 TABLET (40 MG TOTAL) BY MOUTH DAILY AT 6 PM. 12/01/18   Gollan, Kathlene November, MD  BD INSULIN SYRINGE U/F 31G X 5/16" 1 ML MISC  12/21/18   [provider]  benzonatate (TESSALON) 200 MG capsule Take 1 capsule (200 mg total) by mouth 3 (three) times daily. 11/28/18   Dustin Flock, MD  benzonatate (TESSALON) 200 MG capsule Take 1 capsule (200 mg total) by mouth 3 (three) times daily as needed for cough. 12/26/18 12/26/19  Owens Loffler, MD  blood glucose meter kit and  supplies Dispense based on patient and insurance preference. Use to check blood sugar daily. 11/16/18   Copland, Frederico Hamman, MD  budesonide (PULMICORT) 0.5 MG/2ML nebulizer solution Take 2 mLs (0.5 mg total) by nebulization 2 (two) times daily. 03/06/18 03/06/19  Vaughan Basta, MD  carvedilol (COREG) 12.5 MG tablet Take 1 tablet (12.5 mg total) by mouth 2 (two) times daily. 11/14/18 02/12/19  Minna Merritts, MD  cetirizine (ZYRTEC) 10 MG chewable tablet Chew 10 mg by mouth daily.    [provider]  Cholecalciferol (VITAMIN D) 2000 units CAPS Take 2,000 Units by mouth every evening.    [provider]  formoterol (PERFOROMIST) 20 MCG/2ML nebulizer solution Take 2 mLs (20 mcg total) by nebulization 2 (two) times daily. J44.9 06/10/18   [name], MD  gabapentin (NEURONTIN) 300 MG capsule Take 1 capsule (300 mg total) by mouth at bedtime. 11/03/18   Copland, Frederico Hamman, MD  guaiFENesin-codeine 100-10 MG/5ML syrup Take 5 mLs by mouth at bedtime as needed for cough. 12/26/18   Copland, Frederico Hamman, MD  insulin aspart (NOVOLOG) 100  UNIT/ML injection 5 units subcutaneous prior to meals (okay to substitute generic or what is covered by [employer]) Patient taking differently: 8 units subcutaneous prior to meals (okay to substitute generic or what is covered by Insurance) 12/21/18   Loletha Grayer, MD  insulin glargine (LANTUS) 100 UNIT/ML injection Inject 0.2 mLs (20 Units total) into the skin daily. Patient taking differently: Inject 25 Units into the skin daily.  12/22/18   Wieting, Richard, MD  ipratropium-albuterol (DUONEB) 0.5-2.5 (3) MG/3ML SOLN Take 3 mLs by nebulization every 4 (four) hours as needed. DX:J44.9 Patient taking differently: Take 3 mLs by nebulization every 4 (four) hours as needed (for shortness of breath/wheezing).  11/04/18   [name], MD  ivabradine (CORLANOR) 5 MG TABS tablet Take 1 tablet (5 mg total) by mouth 2 (two) times daily with a meal. 08/08/18   Alisa Graff,  FNP  LORazepam (ATIVAN) 0.5 MG tablet Take 1 tablet (0.5 mg total) by mouth 3 (three) times daily as needed for anxiety. 11/11/18   Copland, Frederico Hamman, MD  losartan (COZAAR) 25 MG tablet Take 0.5 tablets (12.5 mg total) by mouth daily. 11/14/18   Minna Merritts, MD  Multiple Vitamin (MULTIVITAMIN WITH MINERALS) TABS tablet Take 1 tablet by mouth daily. 03/07/18   Vaughan Basta, MD  nystatin (MYCOSTATIN) 100000 UNIT/ML suspension TAKE AS DIRECTED 5 MLS (500,000 UNITS TOTAL) IN THE MOUTH OR THROAT 4 TIMES DAILY Patient taking differently: Use as directed 5 mLs in the mouth or throat 4 (four) times daily.  10/12/18   Copland, Frederico Hamman, MD  omeprazole (PRILOSEC) 20 MG capsule Take 20 mg by mouth daily.    [provider]  ondansetron (ZOFRAN ODT) 4 MG disintegrating tablet Take 1-2 tablets (4-8 mg total) by mouth every 6 (six) hours as needed for nausea or vomiting. 11/16/18   Copland, Frederico Hamman, MD  predniSONE (DELTASONE) 5 MG tablet 6 tabs po daily for 3 days; 5 tabs po daily for three days; 4 tabs po daily for three days; 3 tabs po daily afterwards 12/21/18   Loletha Grayer, MD  spironolactone (ALDACTONE) 25 MG tablet TAKE 1 TABLET BY MOUTH EVERY DAY 12/01/18   Minna Merritts, MD  tiZANidine (ZANAFLEX) 4 MG tablet Take 1 tablet (4 mg total) by mouth Nightly. 03/23/18   Copland, Frederico Hamman, MD  torsemide (DEMADEX) 20 MG tablet Take 2 tablets (40 mg total) by mouth daily. May also take 1 tablet (20 mg total) as needed (AS NEEDED FOR SWELLING). 12/29/18 03/29/19  Minna Merritts, MD  vitamin C (ASCORBIC ACID) 500 MG tablet Take 500 mg by mouth daily.    [provider]    Allergies Prilosec [omeprazole]  Family History  Problem Relation Age of Onset   Coronary artery disease Other        family hx   Breast cancer Other        1st egree relative <50   Cancer Mother    Heart disease Father     Social History Social History   Tobacco Use   Smoking status: Former Smoker     Packs/day: 0.50    Years: 45.00    Pack years: 22.50    Types: Cigarettes    Last attempt to quit: 01/25/2018    Years since quitting: 0.9   Smokeless tobacco: Never Used   Tobacco comment: 1 ppd +40 years  Substance Use Topics   Alcohol use: Not Currently    Alcohol/week: 0.0 standard drinks    Comment: weekly but  last dose 1 month   Drug use: No    Review of Systems  Constitutional: Negative for fever. Eyes: Negative for visual changes. ENT: Negative for sore throat. Neck: No neck pain  Cardiovascular: Negative for chest pain. Respiratory: + shortness of breath, cough Gastrointestinal: Negative for abdominal pain, vomiting or diarrhea. Genitourinary: Negative for dysuria. Musculoskeletal: Negative for back pain. Skin: Negative for rash. Neurological: Negative for headaches, weakness or numbness. Psych: No SI or HI  ____________________________________________   PHYSICAL EXAM:  VITAL SIGNS: ED Triage Vitals  Enc Vitals Group     BP 12/25/2018 1302 (!) 97/55     Pulse Rate 12/10/2018 1302 78     Resp --      Temp 01/08/2019 1302 99.8 F (37.7 C)     Temp Source 12/28/2018 1302 Oral     SpO2 12/28/2018 1302 98 %     Weight 12/19/2018 1305 246 lb (111.6 kg)     Height 12/27/2018 1305 _0  (1.803 m)     Head Circumference --      Peak Flow --      Pain Score --      Pain Loc --      Pain Edu? --      Excl. in [place]? --     Constitutional: Alert and oriented, likely ill-appearing mild respiratory distress.  HEENT:      Head: Normocephalic and atraumatic.         Eyes: Conjunctivae are normal. Sclera is non-icteric.       Mouth/Throat: Mucous membranes are moist.       Neck: Supple with no signs of meningismus. Cardiovascular: Regular rate and rhythm. No murmurs, gallops, or rubs. 2+ symmetrical distal pulses are present in all extremities.  Respiratory: Tachypneic with coarse rhonchi bilaterally, hypoxic to upper 80s on baseline 5 L which improved on 7 L via  nonrebreather.  Gastrointestinal: Soft, non tender, and non distended with positive bowel sounds. No rebound or guarding. Musculoskeletal: 2+ pitting edema bilateral lower extremities Neurologic: Normal speech and language. Face is symmetric. Moving all extremities. No gross focal neurologic deficits are appreciated. Skin: Skin is warm, dry and intact. No rash noted. Psychiatric: Mood and affect are normal. Speech and behavior are normal.  ____________________________________________   LABS (all labs ordered are listed, but only abnormal results are displayed)  Labs Reviewed  COMPREHENSIVE METABOLIC PANEL - Abnormal; Notable for the following components:      Result Value   Potassium 3.1 (*)    Chloride 92 (*)    CO2 33 (*)    BUN 33 (*)    Calcium 8.6 (*)    Albumin 3.4 (*)    Total Bilirubin 1.6 (*)    All other components within normal limits  LACTIC ACID, PLASMA - Abnormal; Notable for the following components:   Lactic Acid, Venous 2.0 (*)    All other components within normal limits  CBC WITH DIFFERENTIAL/PLATELET - Abnormal; Notable for the following components:   WBC 60.6 (*)    RBC 2.30 (*)    Hemoglobin 8.1 (*)    HCT 24.8 (*)    MCV 107.8 (*)    MCH 35.2 (*)    RDW 17.0 (*)    Platelets 12 (*)    nRBC 0.4 (*)    Neutro Abs 31.8 (*)    Lymphs Abs 7.5 (*)    Monocytes Absolute 13.9 (*)    Basophils Absolute 0.2 (*)    Abs Immature Granulocytes 7.30 (*)  All other components within normal limits  BRAIN NATRIURETIC PEPTIDE - Abnormal; Notable for the following components:   B Natriuretic Peptide 147.0 (*)    All other components within normal limits  SARS CORONAVIRUS 2 (HOSPITAL ORDER, PERFORMED IN [place] [facility])  CULTURE, BLOOD (ROUTINE X 2)  CULTURE, BLOOD (ROUTINE X 2)  PROTIME-INR  LACTIC ACID, PLASMA  URINALYSIS, COMPLETE (UACMP) WITH MICROSCOPIC  PATHOLOGIST SMEAR REVIEW   ____________________________________________  EKG  ED ECG  REPORT I, Rudene Re, the attending physician, personally viewed and interpreted this ECG.  Normal sinus rhythm, rate of 77, first-degree AV block, right bundle branch block, LP FB, right axis deviation, ST depressions in lateral leads.  Unchanged from prior. ____________________________________________  RADIOLOGY  I have personally reviewed the images performed during this visit and I agree with the Radiologist's read.   Interpretation by Radiologist:  Ct Angio Chest Pe W And/or Wo Contrast  Result Date: 12/09/2018 CLINICAL DATA:  Worsening cough and shortness of breath. Recent hospitalization for pneumonia. EXAM: CT ANGIOGRAPHY CHEST WITH CONTRAST TECHNIQUE: Multidetector CT imaging of the chest was performed using the standard protocol during bolus administration of intravenous contrast. Multiplanar CT image reconstructions and MIPs were obtained to evaluate the vascular anatomy. CONTRAST:  87m OMNIPAQUE IOHEXOL 350 MG/ML SOLN COMPARISON:  Radiographs today.  CT 11/23/2018 and 05/13/2017. FINDINGS: Cardiovascular: The pulmonary arteries are well opacified with contrast to the level of the subsegmental branches. There is no evidence of acute pulmonary embolism. Extensive coronary artery atherosclerosis again noted with lesser involvement the aorta and great vessels. No acute vascular findings. There are calcifications the aortic valve. The heart size is normal. There is no pericardial effusion. Mediastinum/Nodes: There are no enlarged mediastinal, hilar or axillary lymph nodes.Stable small mediastinal lymph nodes. The thyroid gland, trachea and esophagus appear unchanged. Lungs/Pleura: There is no pleural effusion or pneumothorax. 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 There are moderate emphysematous changes. The left lower lobe consolidation seen on the  most recent study has improved, although there are new patchy ground-glass and airspace opacities throughout both lower lobes and the lingula. These demonstrate a nonspecific distribution. Upper abdomen: The visualized upper abdomen appears stable without suspicious findings. Borderline hepatic steatosis. Musculoskeletal/Chest wall: There is no chest wall mass or suspicious osseous finding. Review of the MIP images confirms the above findings. IMPRESSION: 1. No evidence of acute pulmonary embolism. 2. The left lower lobe consolidation seen on the most recent study has improved. However, there are new patchy ground-glass and airspace opacities in both lower lobes and the lingula. These have a nonspecific distribution and could reflect bacterial or viral infection. COVID-19 testing today was negative. 3. Underlying chronic interstitial lung disease, likely due to a combination emphysema and other smoking related lung disease such as Langerhans cell histiocytosis as correlated with prior studies. 4. Coronary and Aortic Atherosclerosis (ICD10-I70.0). Electronically Signed   By: WRichardean SaleM.D.   On: 12/23/2018 15:46   Dg Chest Portable 1 View  Result Date: 01/03/2019 CLINICAL DATA:  Cough and worsening shortness of breath. EXAM: PORTABLE CHEST 1 VIEW COMPARISON:  12/17/2018 FINDINGS: Prior median sternotomy is again noted. The cardiomediastinal silhouette is unchanged with normal heart size. Chronic interstitial densities throughout both lungs are unchanged. No acute airspace consolidation, overt pulmonary edema, pleural effusion, pneumothorax is identified. No acute osseous abnormality is seen. IMPRESSION: Chronic interstitial lung disease without  evidence of acute abnormality. Electronically Signed   By: Logan Bores M.D.   On: 12/10/2018 13:22     ____________________________________________   PROCEDURES  Procedure(s) performed: None Procedures Critical Care performed: yes ' CRITICAL  CARE Performed by: Rudene Re  ?  Total critical care time: 40 min  Critical care time was exclusive of separately billable procedures and treating other patients.  Critical care was necessary to treat or prevent imminent or life-threatening deterioration.  Critical care was time spent personally by me on the following activities: development of treatment plan with patient and/or surrogate as well as nursing, discussions with consultants, evaluation of patient's response to treatment, examination of patient, obtaining history from patient or surrogate, ordering and performing treatments and interventions, ordering and review of laboratory studies, ordering and review of radiographic studies, pulse oximetry and re-evaluation of patient's condition. ____________________________________________   INITIAL IMPRESSION / ASSESSMENT AND PLAN / ED COURSE   68 y.o. male with a history of COPD on 5 L nasal cannula, CHF with a EF of 30%, CAD, diabetes, hypertension, hyperlipidemia, CML who presents for evaluation of shortness of breath, productive cough, and increased oxygen requirement x3 days.  Patient in mild respiratory distress, hypoxic on baseline 5L nasal cannula, improved on 7 L nonrebreather to mid 90s.  Patient has diffuse coarse rhonchi bilaterally.  Initial chest x-ray not concerning for pneumonia however patient is describing also very productive cough which makes me concerned he does have pneumonia.  COVID swab is pending.  CT of the chest is pending.  Labs showing white count of 60,000 (baseline is in the mid 40s), worsening thrombocytopenia.  Lactic of 2.0.  BNP is slightly elevated at 147. Will give broad spectrum abx and admit to Hospitalist    _________________________ 3:54 PM on 12/15/2018 -----------------------------------------  CT confirms new pneumonia.  COVID is negative.  Will cover with cefepime and Vanco.  Fluids were held due to volume overload.  Patient does not meet  sepsis criteria at this time.  Discussed with Dr. Verdell Carmine who will admit patient.   As part of my medical decision making, I reviewed the following data within the [place] notes reviewed and incorporated, Labs reviewed , EKG interpreted , Old EKG reviewed, Old chart reviewed, Radiograph reviewed , Discussed with admitting physician , Notes from prior ED visits and [place] Controlled Substance Database    Pertinent labs & imaging results that were available during my care of the patient were reviewed by me and considered in my medical decision making (see chart for details).    ____________________________________________   FINAL CLINICAL IMPRESSION(S) / ED DIAGNOSES  Final diagnoses:  Acute on chronic respiratory failure with hypoxia ([place])  HCAP (healthcare-associated pneumonia)      NEW MEDICATIONS STARTED DURING THIS VISIT:  ED Discharge Orders    None       Note:  This document was prepared using Dragon voice recognition software and may include unintentional dictation errors.    Alfred Levins, [place], MD 12/13/2018 [phone]

## 2018-12-31 NOTE — ED Triage Notes (Signed)
Patient arrives via EMS with cough and SOB worsening since was discharged for pneumonia one week ago. Audible rhonchi. Speaking full sentences. SOB and fatigues at rest. Sats 80 on Devol 6l. Changed to NRB with Sat increasing to 98.

## 2019-01-01 DIAGNOSIS — C9310 Chronic myelomonocytic leukemia not having achieved remission: Secondary | ICD-10-CM

## 2019-01-01 DIAGNOSIS — Z8774 Personal history of (corrected) congenital malformations of heart and circulatory system: Secondary | ICD-10-CM

## 2019-01-01 DIAGNOSIS — Z7982 Long term (current) use of aspirin: Secondary | ICD-10-CM

## 2019-01-01 DIAGNOSIS — J189 Pneumonia, unspecified organism: Principal | ICD-10-CM

## 2019-01-01 DIAGNOSIS — Z79899 Other long term (current) drug therapy: Secondary | ICD-10-CM

## 2019-01-01 DIAGNOSIS — J440 Chronic obstructive pulmonary disease with acute lower respiratory infection: Secondary | ICD-10-CM

## 2019-01-01 DIAGNOSIS — I252 Old myocardial infarction: Secondary | ICD-10-CM

## 2019-01-01 DIAGNOSIS — J9622 Acute and chronic respiratory failure with hypercapnia: Secondary | ICD-10-CM

## 2019-01-01 DIAGNOSIS — Z794 Long term (current) use of insulin: Secondary | ICD-10-CM

## 2019-01-01 DIAGNOSIS — D696 Thrombocytopenia, unspecified: Secondary | ICD-10-CM

## 2019-01-01 DIAGNOSIS — Z7951 Long term (current) use of inhaled steroids: Secondary | ICD-10-CM

## 2019-01-01 DIAGNOSIS — J449 Chronic obstructive pulmonary disease, unspecified: Secondary | ICD-10-CM

## 2019-01-01 DIAGNOSIS — E785 Hyperlipidemia, unspecified: Secondary | ICD-10-CM

## 2019-01-01 DIAGNOSIS — J9621 Acute and chronic respiratory failure with hypoxia: Secondary | ICD-10-CM

## 2019-01-01 DIAGNOSIS — I255 Ischemic cardiomyopathy: Secondary | ICD-10-CM

## 2019-01-01 DIAGNOSIS — Z20828 Contact with and (suspected) exposure to other viral communicable diseases: Secondary | ICD-10-CM

## 2019-01-01 DIAGNOSIS — D539 Nutritional anemia, unspecified: Secondary | ICD-10-CM

## 2019-01-01 LAB — BASIC METABOLIC PANEL
Anion gap: 13 (ref 5–15)
BUN: 44 mg/dL — ABNORMAL HIGH (ref 8–23)
CO2: 27 mmol/L (ref 22–32)
Calcium: 8.2 mg/dL — ABNORMAL LOW (ref 8.9–10.3)
Chloride: 94 mmol/L — ABNORMAL LOW (ref 98–111)
Creatinine, Ser: 1.17 mg/dL (ref 0.61–1.24)
GFR calc Af Amer: >60 mL/min (ref >60)
GFR calc non Af Amer: >60 mL/min (ref >60)
Glucose, Bld: 258 mg/dL — ABNORMAL HIGH (ref 70–99)
Potassium: 4.1 mmol/L (ref 3.5–5.1)
Sodium: 134 mmol/L — ABNORMAL LOW (ref 135–145)

## 2019-01-01 LAB — FIBRINOGEN: Fibrinogen: 328 mg/dL (ref 210–475)

## 2019-01-01 LAB — CBC
HCT: 21.4 % — ABNORMAL LOW (ref 39.0–52.0)
Hemoglobin: 6.8 g/dL — ABNORMAL LOW (ref 13.0–17.0)
MCH: 34.5 pg — ABNORMAL HIGH (ref 26.0–34.0)
MCHC: 31.8 g/dL (ref 30.0–36.0)
MCV: 108.6 fL — ABNORMAL HIGH (ref 80.0–100.0)
Platelets: 10 10*3/uL — CL (ref 150–400)
RBC: 1.97 MIL/uL — ABNORMAL LOW (ref 4.22–5.81)
RDW: 17.2 % — ABNORMAL HIGH (ref 11.5–15.5)
WBC: 56.7 10*3/uL (ref 4.0–10.5)
nRBC: 0.1 % (ref 0.0–0.2)

## 2019-01-01 LAB — GLUCOSE, CAPILLARY
Glucose-Capillary: 228 mg/dL — ABNORMAL HIGH (ref 70–99)
Glucose-Capillary: 310 mg/dL — ABNORMAL HIGH (ref 70–99)
Glucose-Capillary: 290 mg/dL — ABNORMAL HIGH (ref 70–99)
Glucose-Capillary: 350 mg/dL — ABNORMAL HIGH (ref 70–99)

## 2019-01-01 LAB — PATHOLOGIST SMEAR REVIEW

## 2019-01-01 LAB — ABO/RH: ABO/RH(D): O POS

## 2019-01-01 LAB — APTT: aPTT: 36 seconds (ref 24–36)

## 2019-01-01 MED ORDER — IPRATROPIUM-ALBUTEROL 0.5-2.5 (3) MG/3ML IN SOLN
3.0000 mL | RESPIRATORY_TRACT | Status: DC
  Administered 2019-01-01 – 2019-01-04 (×18): 3 mL via RESPIRATORY_TRACT
  Filled 2019-01-01 (×7): qty 3
  Filled 2019-01-01: qty 6
  Filled 2019-01-01 (×8): qty 3

## 2019-01-01 MED ORDER — FUROSEMIDE 10 MG/ML IJ SOLN
40.0000 mg | Freq: Once | INTRAMUSCULAR | Status: AC
  Administered 2019-01-01: 40 mg via INTRAVENOUS
  Filled 2019-01-01: qty 4

## 2019-01-01 MED ORDER — ORAL CARE MOUTH RINSE
15.0000 mL | Freq: Two times a day (BID) | OROMUCOSAL | Status: DC
  Administered 2019-01-01 – 2019-01-03 (×4): 15 mL via OROMUCOSAL

## 2019-01-01 MED ORDER — FLUTICASONE PROPIONATE 50 MCG/ACT NA SUSP
2.0000 | Freq: Every day | NASAL | Status: DC
  Administered 2019-01-01 – 2019-01-04 (×4): 2 via NASAL
  Filled 2019-01-01: qty 16

## 2019-01-01 MED ORDER — SODIUM CHLORIDE 0.9% IV SOLUTION
Freq: Once | INTRAVENOUS | Status: AC
  Administered 2019-01-01: 18:00:00 via INTRAVENOUS

## 2019-01-01 NOTE — Consult Note (Signed)
Reason for Consult: Assistance with discussion of end-of-life issues in a patient with end-stage COPD, assistance with management of acute on chronic respiratory failure. Referring Physician: Doylene Canning MD  Tony Watson is an 68 y.o. male.  HPI: Patient is a 68 year old former smoker with COPD class IV, chronic hypoxemic respiratory failure, interstitial lung disease associated with smoking and CMML who presented to [facility] with increasing dyspnea, hypoxia over his usual and recurrent pneumonia.  The patient has had multiple admissions most recently, 10 days ago.  He follows with Dr. Weston Anna with regards to his pulmonary issues.  Most recently a consult order an echocardiogram and this showed that the patient has moderately to severely reduced systolic left ventricular ejection fraction with an EF of 30 to 35% and left atrial dilation.  This is due to ischemic cardiomyopathy as the patient has well-documented history of three-vessel coronary artery disease.  The patient's issues are compounded by CMML with severe anemia he was noted to have a hemoglobin of 6.8 and 21.4 on admission.  He also has severe thrombocytopenia.  The patient is on baseline 5 L/min oxygen.  He has had issues with community-acquired pneumonia and most recently hospital associated pneumonia.  Because of his acute on chronic respiratory failure he required a Trilogy noninvasive ventilatior for home.  Initially he felt that this was helping him but over the last 2 to 3 days he has felt like the machine is "smothering him.  During his last admission it was recommended that palliative care evaluate him however he declined that evaluation.  His current and most salient complaint is that of dyspnea.  Also tenacious sputum which is difficult for him to expectorate.  His sputum ranges for him for quite to occasionally yellow.  He has not had any hemoptysis.  He does feel fatigued.  This is been a chronic issue for him worse over the last  several months.   Past Medical History:  Diagnosis Date  . Arthritis   . CAD (coronary artery disease)    a. inf MI 11/99 (in [place]) w/ PCI to [facility]; b. [place] 2004 [facility]): EF 50%, patent RCA stent, no obs dz; c. MV 2008: EF 42%, inf infarct, no ischemia; d. R/LHC 9/19: 3-v dz w/ mild dif dz LM, m-dLAD 50, dLAD 60, D2 80, RI 80, mRCA-1 90, mRCA-2 20, patent RCA stent, CO/CI 3.7/2  . Chronic combined systolic (congestive) and diastolic (congestive) heart failure ([place])    a. 7/29019 Echo: EF 25-30%, sev diff HK, mild MR mod dil LA, mildly reduced RVSF, elevated CVP  . Chronic respiratory failure with hypoxia (HCC)    a. on 2L supplemental oxygen via [place] followed by pulmonology  . CML (chronic myelocytic leukemia) ([place])   . COPD (chronic obstructive pulmonary disease) ([place])   . Diabetes mellitus without complication ([place])   . Diabetic autonomic neuropathy associated with type 2 diabetes mellitus ([place]) 03/27/2015  . Diverticulosis 2002  . GERD (gastroesophageal reflux disease)   . HTN (hypertension)   . Hyperlipidemia   . ILD (interstitial lung disease) ([place])   . Ischemic cardiomyopathy   . Medical non-compliance   . Smoker   . Spinal stenosis   . TIA (transient ischemic attack)    a. s/p PFO closure 2004 (in [place])  . Ulcer     Past Surgical History:  Procedure Laterality Date  . BACK SURGERY     patient denies-just lumbar punctures  . BRONCHOSCOPY    . CARDIAC CATHETERIZATION  11/99  CI per [facility]  . CATARACT EXTRACTION W/PHACO Right 11/24/2016   Procedure: CATARACT EXTRACTION PHACO AND INTRAOCULAR LENS PLACEMENT (IOC);  Surgeon: Birder Robson, MD;  Location: [facility] ORS;  Service: Ophthalmology;  Laterality: Right;  [place] 1:04.3 AP% 22.3 CDE 14.35 Fluid pack lot # [id number]  . CATARACT EXTRACTION W/PHACO Left 12/29/2016   Procedure: CATARACT EXTRACTION PHACO AND INTRAOCULAR LENS PLACEMENT (IOC);  Surgeon: Birder Robson, MD;  Location: [facility] ORS;  Service: Ophthalmology;  Laterality:  Left;  [place] 00:52 AP% 18.5 CDE 9.67 Fluid pack lot # [id number]  . COLONOSCOPY    . COLONOSCOPY WITH PROPOFOL N/A 05/25/2017   Procedure: COLONOSCOPY WITH PROPOFOL;  Surgeon: Jonathon Bellows, MD;  Location: [facility] ENDOSCOPY;  Service: Gastroenterology;  Laterality: N/A;  . CORONARY ANGIOPLASTY     STENT  . CORONARY ARTERY BYPASS GRAFT     stent  . ESOPHAGOGASTRODUODENOSCOPY (EGD) WITH PROPOFOL N/A 05/25/2017   Procedure: ESOPHAGOGASTRODUODENOSCOPY (EGD) WITH PROPOFOL;  Surgeon: Jonathon Bellows, MD;  Location: [facility] ENDOSCOPY;  Service: Gastroenterology;  Laterality: N/A;  . EYE SURGERY    . FLEXIBLE BRONCHOSCOPY N/A 05/19/2017   Procedure: FLEXIBLE BRONCHOSCOPY;  Surgeon: Wilhelmina Mcardle, MD;  Location: [facility] ORS;  Service: Pulmonary;  Laterality: N/A;  . open heart surgery  2004   PFO repair  . RIGHT/LEFT HEART CATH AND CORONARY ANGIOGRAPHY N/A 05/03/2018   Procedure: RIGHT/LEFT HEART CATH AND CORONARY ANGIOGRAPHY;  Surgeon: Nelva Bush, MD;  Location: [facility] CV LAB;  Service: Cardiovascular;  Laterality: N/A;  . UPPER GI ENDOSCOPY      Family History  Problem Relation Age of Onset  . Coronary artery disease Other        family hx  . Breast cancer Other        1st egree relative <50  . Cancer Mother   . Heart disease Father     Social History:  reports that he quit smoking about 11 months ago. His smoking use included cigarettes. He has a 22.50 pack-year smoking history. He has never used smokeless tobacco. He reports previous alcohol use. He reports that he does not use drugs.  Allergies:  Allergies  Allergen Reactions  . Prilosec [Omeprazole] Diarrhea    Medications:  I have reviewed the patient's current medications. Prior to Admission:  Medications Prior to Admission  Medication Sig Dispense Refill Last Dose  . acetaminophen (TYLENOL) 500 MG tablet Take 1,000 mg by mouth daily as needed for moderate pain or headache.   prn at prn  . aspirin 81 MG chewable tablet Chew 1  tablet (81 mg total) by mouth daily. 30 tablet 0 12/30/2018 at Unknown time  . atorvastatin (LIPITOR) 40 MG tablet TAKE 1 TABLET (40 MG TOTAL) BY MOUTH DAILY AT 6 PM. 30 tablet 0 12/30/2018 at 1900  . benzonatate (TESSALON) 200 MG capsule Take 1 capsule (200 mg total) by mouth 3 (three) times daily as needed for cough. 50 capsule 3 prn at prn  . blood glucose meter kit and supplies Dispense based on patient and insurance preference. Use to check blood sugar daily. 1 each 0 unknown at unknown  . budesonide (PULMICORT) 0.5 MG/2ML nebulizer solution Take 2 mLs (0.5 mg total) by nebulization 2 (two) times daily. 1440 mL 0 12/30/2018 at Unknown time  . carvedilol (COREG) 12.5 MG tablet Take 1 tablet (12.5 mg total) by mouth 2 (two) times daily. 180 tablet 3 12/30/2018 at 1900  . cetirizine (ZYRTEC) 10 MG chewable tablet Chew 10 mg by mouth daily.  12/30/2018 at Unknown time  . Cholecalciferol (VITAMIN D) 2000 units CAPS Take 2,000 Units by mouth every evening.   12/30/2018 at 1900  . formoterol (PERFOROMIST) 20 MCG/2ML nebulizer solution Take 2 mLs (20 mcg total) by nebulization 2 (two) times daily. J44.9 500 mL 12 12/30/2018 at 1900  . gabapentin (NEURONTIN) 300 MG capsule Take 1 capsule (300 mg total) by mouth at bedtime. 90 capsule 1 12/30/2018 at 1900  . guaiFENesin-codeine 100-10 MG/5ML syrup Take 5 mLs by mouth at bedtime as needed for cough. 180 mL 0 prn at prn  . insulin aspart (NOVOLOG) 100 UNIT/ML injection 5 units subcutaneous prior to meals (okay to substitute generic or what is covered by [employer]) (Patient taking differently: 8 units subcutaneous prior to meals (okay to substitute generic or what is covered by [employer])) 10 mL 1 12/30/2018 at 1900  . insulin glargine (LANTUS) 100 UNIT/ML injection Inject 0.2 mLs (20 Units total) into the skin daily. (Patient taking differently: Inject 25 Units into the skin daily. ) 10 mL 1 12/30/2018 at Unknown time  . ipratropium-albuterol (DUONEB) 0.5-2.5 (3)  MG/3ML SOLN Take 3 mLs by nebulization every 4 (four) hours as needed. DX:J44.9 (Patient taking differently: Take 3 mLs by nebulization every 4 (four) hours as needed (for shortness of breath/wheezing). ) 1620 mL 5 prn at prn  . ivabradine (CORLANOR) 5 MG TABS tablet Take 1 tablet (5 mg total) by mouth 2 (two) times daily with a meal. 60 tablet 5 12/30/2018 at 1700  . LORazepam (ATIVAN) 0.5 MG tablet Take 1 tablet (0.5 mg total) by mouth 3 (three) times daily as needed for anxiety. 120 tablet 1 prn at prn  . losartan (COZAAR) 25 MG tablet Take 0.5 tablets (12.5 mg total) by mouth daily. 45 tablet 3 12/30/2018 at Unknown time  . Multiple Vitamin (MULTIVITAMIN WITH MINERALS) TABS tablet Take 1 tablet by mouth daily. 30 tablet 0 12/30/2018 at Unknown time  . nystatin (MYCOSTATIN) 100000 UNIT/ML suspension TAKE AS DIRECTED 5 MLS (500,000 UNITS TOTAL) IN THE MOUTH OR THROAT 4 TIMES DAILY (Patient taking differently: Use as directed 5 mLs in the mouth or throat 4 (four) times daily. ) 946 mL 1 12/30/2018 at Unknown time  . ondansetron (ZOFRAN ODT) 4 MG disintegrating tablet Take 1-2 tablets (4-8 mg total) by mouth every 6 (six) hours as needed for nausea or vomiting. 90 tablet 3 prn at prn  . spironolactone (ALDACTONE) 25 MG tablet TAKE 1 TABLET BY MOUTH EVERY DAY 30 tablet 0 12/30/2018 at Unknown time  . tiZANidine (ZANAFLEX) 4 MG tablet Take 1 tablet (4 mg total) by mouth Nightly. 90 tablet 3 12/30/2018 at 1900  . torsemide (DEMADEX) 20 MG tablet Take 2 tablets (40 mg total) by mouth daily. May also take 1 tablet (20 mg total) as needed (AS NEEDED FOR SWELLING). 270 tablet 3 12/30/2018 at Unknown time  . vitamin C (ASCORBIC ACID) 500 MG tablet Take 500 mg by mouth daily.   12/30/2018 at 1900    Results for orders placed or performed during the hospital encounter of 12/20/2018 (from the past 48 hour(s))  Lactic acid, plasma     Status: Abnormal   Collection Time: 12/30/2018  1:07 PM  Result Value Ref Range   Lactic  Acid, Venous 2.0 (HH) 0.5 - 1.9 mmol/L    Comment: CRITICAL RESULT CALLED TO, READ BACK BY AND VERIFIED WITH KIM GAULT ON 12/24/2018 AT 1346 QSD Performed at [facility], [address] [place], [place] [address]  Comprehensive metabolic panel     Status: Abnormal   Collection Time: 12/30/2018  1:08 PM  Result Value Ref Range   Sodium 135 135 - 145 mmol/L   Potassium 3.1 (L) 3.5 - 5.1 mmol/L   Chloride 92 (L) 98 - 111 mmol/L   CO2 33 (H) 22 - 32 mmol/L   Glucose, Bld 79 70 - 99 mg/dL   BUN 33 (H) 8 - 23 mg/dL   Creatinine, Ser 1.05 0.61 - 1.24 mg/dL   Calcium 8.6 (L) 8.9 - 10.3 mg/dL   Total Protein 7.0 6.5 - 8.1 g/dL   Albumin 3.4 (L) 3.5 - 5.0 g/dL   AST 41 15 - 41 U/L   ALT 34 0 - 44 U/L   Alkaline Phosphatase 80 38 - 126 U/L   Total Bilirubin 1.6 (H) 0.3 - 1.2 mg/dL   GFR calc non Af Amer >60 >60 mL/min   GFR calc Af Amer >60 >60 mL/min   Anion gap 10 5 - 15    Comment: Performed at [facility], [address] [place], [place] [address]  CBC with Differential     Status: Abnormal   Collection Time: 12/11/2018  1:08 PM  Result Value Ref Range   WBC 60.6 (HH) 4.0 - 10.5 K/uL    Comment: This critical result has verified and been called to KIM GAULT by Tennis Must on 05 23 2020 at 1437, and has been read back.    RBC 2.30 (L) 4.22 - 5.81 MIL/uL   Hemoglobin 8.1 (L) 13.0 - 17.0 g/dL   HCT 24.8 (L) 39.0 - 52.0 %   MCV 107.8 (H) 80.0 - 100.0 fL   MCH 35.2 (H) 26.0 - 34.0 pg   MCHC 32.7 30.0 - 36.0 g/dL   RDW 17.0 (H) 11.5 - 15.5 %   Platelets 12 (LL) 150 - 400 K/uL    Comment: Immature Platelet Fraction may be clinically indicated, consider ordering this additional test [id number] THIS CRITICAL RESULT HAS VERIFIED AND BEEN CALLED TO KIM GAULT BY STEPHON CAPERS ON 05 23 2020 AT 1437, AND HAS BEEN READ BACK.     nRBC 0.4 (H) 0.0 - 0.2 %   Neutrophils Relative % 53 %   Neutro Abs 31.8 (H) 1.7 - 7.7 K/uL   Lymphocytes Relative 12 %   Lymphs Abs 7.5 (H)  0.7 - 4.0 K/uL   Monocytes Relative 23 %   Monocytes Absolute 13.9 (H) 0.1 - 1.0 K/uL   Eosinophils Relative 0 %   Eosinophils Absolute 0.0 0.0 - 0.5 K/uL   Basophils Relative 0 %   Basophils Absolute 0.2 (H) 0.0 - 0.1 K/uL   WBC Morphology      MODERATE LEFT SHIFT (>5% METAS AND MYELOS,OCC PRO NOTED)   RBC Morphology MORPHOLOGY UNREMARKABLE    Smear Review PLATELETS APPEAR DECREASED    Immature Granulocytes 12 %   Abs Immature Granulocytes 7.30 (H) 0.00 - 0.07 K/uL    Comment: Performed at [facility], [address] [place], [place] [address]  Protime-INR     Status: None   Collection Time: 12/22/2018  1:08 PM  Result Value Ref Range   Prothrombin Time 14.7 11.4 - 15.2 seconds   INR 1.2 0.8 - 1.2    Comment: (NOTE) INR goal varies based on device and disease states. Performed at [facility], [address] [place], [place] [address]   Culture, blood (Routine x 2)     Status: None (Preliminary result)  Collection Time: 12/16/2018  1:08 PM  Result Value Ref Range   Specimen Description BLOOD LEFT ARM    Special Requests      BOTTLES DRAWN AEROBIC AND ANAEROBIC Blood Culture adequate volume   Culture      NO GROWTH < 24 HOURS Performed at [facility], [address] [place], [place] [address]    Report Status PENDING   Culture, blood (Routine x 2)     Status: None (Preliminary result)   Collection Time: 12/20/2018  1:08 PM  Result Value Ref Range   Specimen Description BLOOD LAC    Special Requests      BOTTLES DRAWN AEROBIC AND ANAEROBIC Blood Culture adequate volume   Culture      NO GROWTH < 24 HOURS Performed at [facility], [address], [place], [place] [address]    Report Status PENDING   SARS Coronavirus 2 (CEPHEID- Performed in [place] hospital lab), Hosp Order     Status: None   Collection Time: 01/01/2019  1:08 PM  Result Value Ref Range   SARS Coronavirus 2 NEGATIVE NEGATIVE    Comment: (NOTE) If result is  NEGATIVE SARS-CoV-2 target nucleic acids are NOT DETECTED. The SARS-CoV-2 RNA is generally detectable in upper and lower  respiratory specimens during the acute phase of infection. The lowest  concentration of SARS-CoV-2 viral copies this assay can detect is 250  copies / mL. A negative result does not preclude SARS-CoV-2 infection  and should not be used as the sole basis for treatment or other  patient management decisions.  A negative result may occur with  improper specimen collection / handling, submission of specimen other  than nasopharyngeal swab, presence of viral mutation(s) within the  areas targeted by this assay, and inadequate number of viral copies  (<250 copies / mL). A negative result must be combined with clinical  observations, patient history, and epidemiological information. If result is POSITIVE SARS-CoV-2 target nucleic acids are DETECTED. The SARS-CoV-2 RNA is generally detectable in upper and lower  respiratory specimens dur ing the acute phase of infection.  Positive  results are indicative of active infection with SARS-CoV-2.  Clinical  correlation with patient history and other diagnostic information is  necessary to determine patient infection status.  Positive results do  not rule out bacterial infection or co-infection with other viruses. If result is PRESUMPTIVE POSTIVE SARS-CoV-2 nucleic acids MAY BE PRESENT.   A presumptive positive result was obtained on the submitted specimen  and confirmed on repeat testing.  While 2019 novel coronavirus  (SARS-CoV-2) nucleic acids may be present in the submitted sample  additional confirmatory testing may be necessary for epidemiological  and / or clinical management purposes  to differentiate between  SARS-CoV-2 and other Sarbecovirus currently known to infect humans.  If clinically indicated additional testing with an alternate test  methodology [id number]) is advised. The SARS-CoV-2 RNA is generally  detectable  in upper and lower respiratory sp ecimens during the acute  phase of infection. The expected result is Negative. Fact Sheet for Patients:  StrictlyIdeas.no Fact Sheet for Healthcare Providers: BankingDealers.co.za This test is not yet approved or cleared by the [place] FDA and has been authorized for detection and/or diagnosis of SARS-CoV-2 by FDA under an Emergency Use Authorization (EUA).  This EUA will remain in effect (meaning this test can be used) for the duration of the COVID-19 declaration under Section 564(b)(1) of the Act, 21 U.S.C. section 360bbb-3(b)(1), unless the authorization is  terminated or revoked sooner. Performed at [facility], [address] [place], [place] [address]   Brain natriuretic peptide     Status: Abnormal   Collection Time: 12/13/2018  1:08 PM  Result Value Ref Range   B Natriuretic Peptide 147.0 (H) 0.0 - 100.0 pg/mL    Comment: Performed at [facility], [address] [place], [place] [address]  Pathologist smear review     Status: None   Collection Time: 12/10/2018  1:08 PM  Result Value Ref Range   Path Review      Blood smear and history reviewed, including previous path review on smear from May 9.    Comment: Thrombocytopenia has become critical. Macrocytic anemia has worsened, and there are now a few nucleated RBCs. Leukocytosis has increased, and there is an increased proportion of immature monocytes. A rare blast is now noted. 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  Reviewed by Lemmie Evens. Dicie Beam, MD. Performed at [facility], [address] [place], [place] [address]   Lactic acid, plasma     Status: Abnormal   Collection Time: 01/08/2019  3:44 PM  Result Value Ref Range   Lactic Acid, Venous 2.0 (HH) 0.5 - 1.9 mmol/L    Comment: CRITICAL RESULT CALLED TO, READ BACK BY AND VERIFIED WITH KIM GAULT AT 1626  12/15/2018.PMF Performed at [facility], [address] [place], [place] [address]   Urinalysis, Complete w Microscopic     Status: Abnormal   Collection Time: 12/25/2018  3:44 PM  Result Value Ref Range   Color, Urine YELLOW (A) YELLOW   APPearance HAZY (A) CLEAR   Specific Gravity, Urine 1.018 1.005 - 1.030   pH 5.0 5.0 - 8.0   Glucose, UA 50 (A) NEGATIVE mg/dL   Hgb urine dipstick NEGATIVE NEGATIVE   Bilirubin Urine NEGATIVE NEGATIVE   Ketones, ur NEGATIVE NEGATIVE mg/dL   Protein, ur 30 (A) NEGATIVE mg/dL   Nitrite NEGATIVE NEGATIVE   Leukocytes,Ua NEGATIVE NEGATIVE   RBC / HPF 0-5 0 - 5 RBC/hpf   WBC, UA 0-5 0 - 5 WBC/hpf   Bacteria, UA NONE SEEN NONE SEEN   Squamous Epithelial / LPF NONE SEEN 0 - 5   Mucus PRESENT     Comment: Performed at [facility], [address], [place], [place] [address]  MRSA PCR Screening     Status: None   Collection Time: 12/27/2018  6:47 PM  Result Value Ref Range   MRSA by PCR NEGATIVE NEGATIVE    Comment:        The GeneXpert MRSA Assay (FDA approved for NASAL specimens only), is one component of a comprehensive MRSA colonization surveillance program. It is not intended to diagnose MRSA infection nor to guide or monitor treatment for MRSA infections. Performed at [facility], [place], [place] [address]   Glucose, capillary     Status: Abnormal   Collection Time: 12/23/2018  9:33 PM  Result Value Ref Range   Glucose-Capillary 265 (H) 70 - 99 mg/dL  Basic metabolic panel     Status: Abnormal   Collection Time: 01/01/19  5:15 AM  Result Value Ref Range   Sodium 134 (L) 135 - 145 mmol/L   Potassium 4.1 3.5 - 5.1 mmol/L   Chloride 94 (L) 98 - 111 mmol/L   CO2 27 22 - 32 mmol/L   Glucose, Bld 258 (H) 70 - 99 mg/dL   BUN  44 (H) 8 - 23 mg/dL   Creatinine, Ser 1.17 0.61 - 1.24 mg/dL   Calcium 8.2 (L) 8.9 - 10.3 mg/dL   GFR calc non Af Amer >60 >60 mL/min   GFR calc Af Amer >60 >60 mL/min    Anion gap 13 5 - 15    Comment: Performed at [facility], [address] [place], [place] [address]  CBC     Status: Abnormal   Collection Time: 01/01/19  5:15 AM  Result Value Ref Range   WBC 56.7 (HH) 4.0 - 10.5 K/uL    Comment: CRITICAL VALUE NOTED.  VALUE IS CONSISTENT WITH PREVIOUSLY REPORTED AND CALLED VALUE.   RBC 1.97 (L) 4.22 - 5.81 MIL/uL   Hemoglobin 6.8 (L) 13.0 - 17.0 g/dL   HCT 21.4 (L) 39.0 - 52.0 %   MCV 108.6 (H) 80.0 - 100.0 fL   MCH 34.5 (H) 26.0 - 34.0 pg   MCHC 31.8 30.0 - 36.0 g/dL   RDW 17.2 (H) 11.5 - 15.5 %   Platelets 10 (LL) 150 - 400 K/uL    Comment: Immature Platelet Fraction may be clinically indicated, consider ordering this additional test [id number] CRITICAL VALUE NOTED.  VALUE IS CONSISTENT WITH PREVIOUSLY REPORTED AND CALLED VALUE.    nRBC 0.1 0.0 - 0.2 %    Comment: Performed at [facility], [address] [place], [place] [address]  Glucose, capillary     Status: Abnormal   Collection Time: 01/01/19  7:26 AM  Result Value Ref Range   Glucose-Capillary 228 (H) 70 - 99 mg/dL  Glucose, capillary     Status: Abnormal   Collection Time: 01/01/19 11:54 AM  Result Value Ref Range   Glucose-Capillary 310 (H) 70 - 99 mg/dL  APTT     Status: None   Collection Time: 01/01/19 12:54 PM  Result Value Ref Range   aPTT 36 24 - 36 seconds    Comment: Performed at [facility], [address] [place], [place] [address]  Fibrinogen     Status: None   Collection Time: 01/01/19 12:54 PM  Result Value Ref Range   Fibrinogen 328 210 - 475 mg/dL    Comment: Performed at [facility], [place], [place] [address]    Ct Angio Chest Pe W And/or Wo Contrast  Result Date: 12/30/2018 CLINICAL DATA:  Worsening cough and shortness of breath. Recent hospitalization for pneumonia. EXAM: CT ANGIOGRAPHY CHEST WITH CONTRAST TECHNIQUE: Multidetector CT imaging of the chest was performed using the standard protocol  during bolus administration of intravenous contrast. Multiplanar CT image reconstructions and MIPs were obtained to evaluate the vascular anatomy. CONTRAST:  41m OMNIPAQUE IOHEXOL 350 MG/ML SOLN COMPARISON:  Radiographs today.  CT 11/23/2018 and 05/13/2017. FINDINGS: Cardiovascular: The pulmonary arteries are well opacified with contrast to the level of the subsegmental branches. There is no evidence of acute pulmonary embolism. Extensive coronary artery atherosclerosis again noted with lesser involvement the aorta and great vessels. No acute vascular findings. There are calcifications the aortic valve. The heart size is normal. There is no pericardial effusion. Mediastinum/Nodes: There are no enlarged mediastinal, hilar or axillary lymph nodes.Stable small mediastinal lymph nodes. The thyroid gland, trachea and esophagus appear unchanged. Lungs/Pleura: There is no pleural effusion or pneumothorax. 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 There are moderate emphysematous changes. The left lower lobe consolidation  seen on the most recent study has improved, although there are new patchy ground-glass and airspace opacities throughout both lower lobes and the lingula. These demonstrate a nonspecific distribution. Upper abdomen: The visualized upper abdomen appears stable without suspicious findings. Borderline hepatic steatosis. Musculoskeletal/Chest wall: There is no chest wall mass or suspicious osseous finding. Review of the MIP images confirms the above findings. IMPRESSION: 1. No evidence of acute pulmonary embolism. 2. The left lower lobe consolidation seen on the most recent study has improved. However, there are new patchy ground-glass and airspace opacities in both lower lobes and the lingula. These have a nonspecific distribution and could reflect bacterial or viral  infection. COVID-19 testing today was negative. 3. Underlying chronic interstitial lung disease, likely due to a combination emphysema and other smoking related lung disease such as Langerhans cell histiocytosis as correlated with prior studies. 4. Coronary and Aortic Atherosclerosis (ICD10-I70.0). Electronically Signed   By: Richardean Sale M.D.   On: 12/23/2018 15:46   Dg Chest Portable 1 View  Result Date: 01/08/2019 CLINICAL DATA:  Cough and worsening shortness of breath. EXAM: PORTABLE CHEST 1 VIEW COMPARISON:  12/17/2018 FINDINGS: Prior median sternotomy is again noted. The cardiomediastinal silhouette is unchanged with normal heart size. Chronic interstitial densities throughout both lungs are unchanged. No acute airspace consolidation, overt pulmonary edema, pleural effusion, pneumothorax is identified. No acute osseous abnormality is seen. IMPRESSION: Chronic interstitial lung disease without evidence of acute abnormality. Electronically Signed   By: Logan Bores M.D.   On: 01/03/2019 13:22    Review of Systems  Constitutional: Positive for malaise/fatigue.  HENT: Negative.   Eyes: Negative.   Respiratory: Positive for cough, sputum production, shortness of breath and wheezing. Negative for hemoptysis.   Cardiovascular: Positive for leg swelling.  Gastrointestinal: Negative.   Genitourinary: Negative.   Musculoskeletal: Negative.   Skin:       Multiple ecchymotic areas, easy bruising  Neurological: Positive for weakness.  Endo/Heme/Allergies: Negative.   Psychiatric/Behavioral: Negative.   All other systems reviewed and are negative.  Blood pressure (!) 119/95, pulse 100, temperature 98.4 F (36.9 C), temperature source Oral, resp. rate (!) 23, height _0  (1.803 m), weight 111.6 kg, SpO2 92 %. Physical Exam  Nursing note and vitals reviewed. Constitutional: He is oriented to person, place, and time. He appears well-developed. He appears ill.  Chronically ill-appearing  gentleman,tachypneic  HENT:  Head: Normocephalic and atraumatic.  Right Ear: External ear normal.  Left Ear: External ear normal.  Nose: Nose normal.  Mouth/Throat: Oropharynx is clear and moist and mucous membranes are normal. Abnormal dentition.  Eyes: Pupils are equal, round, and reactive to light. Conjunctivae are normal. No scleral icterus.  Neck: Neck supple. No JVD present. No tracheal deviation present.  Cardiovascular: Normal rate, regular rhythm, normal heart sounds and intact distal pulses.  No murmur heard. Respiratory: Accessory muscle usage present. Tachypnea noted. He has decreased breath sounds. He has no wheezes. He has rhonchi. He has no rales.  Distant breath sounds throughout  GI: Soft. He exhibits no distension. There is no abdominal tenderness.  Musculoskeletal:        General: Edema present.     Comments: Trace edema  Neurological: He is alert and oriented to person, place, and time.  No focal deficits  Skin: Skin is warm and dry.  Multiple ecchymoses, poor skin turgor, very thin skin.  Psychiatric: He has a normal mood and affect. His behavior is normal. Judgment and thought content normal.  Assessment/Plan:  1.  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  He is chronically on 5 L/min nasal cannula.  Currently he is requiring 10 L of high flow nasal cannula.  I suspect that other factors aggravating this are his severe anemia and cardiomyopathy with decompensation.  Continue oxygen supplementation to keep oxygen saturation between 88 to 92% and no higher.  Transfuse as he tolerates.  To increase oxygen carrying capacity, diurese after transfusion given his low EF.  2.  COPD with decompensation: Continue bronchodilators, oxygen supplementation as above, correct anemia as possible.  Patient also appears to have residual pneumonia prior agree with  antibiotics.  3.  Healthcare associated pneumonia: Antibiotics.  Agree with cefepime.  4.  Ischemic cardiomyopathy with ejection fraction of 30 to 35%: This issue adds complexity to his management and may also be adding to his sensation of dyspnea.  Continue diuretics.  5.  CMML: This issue adds complexity to his management.  He has significant anemia and thrombocytopenia likely from CMML.  Anemia will aggravate patient's sensation of dyspnea.   6.  End-of-life discussions: Patient is currently DNR/DNI.  This is reasonable given his multiple comorbid conditions that cannot be further optimized.  I have discussed evaluation by palliative medicine and the patient is willing to be evaluated.   Thank you for allowing [facility] to participate in this patient's care.  The patient follows regularly with Dr. Weston Anna and should follow-up with Dr. Tobie Poet after discharge.             Vernard Gambles 01/01/2019, 1:54 PM

## 2019-01-01 NOTE — Consult Note (Signed)
[facility]  Date of admission:  12/18/2018  Inpatient day:  01/01/2019  Consulting physician:  Dr Vaughan Basta.  Reason for Consultation:  CMML, COPD chronic oxygen, sepsis, Severe thrombocytopenia.  Chief Complaint: Tony Watson is a 68 y.o. 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  HPI:   The patient is followed in the medical oncology clinic for CMML-1 by Dr. Delight Hoh.  He was last seen in the medical oncology clinic on 08/29/2018.  At that time, he denied any B symptoms.  He had chronic fatigue and shortness of breath.   Counts included a adequate 34, hemoglobin 11.3, MCV 100.3, platelets 164,000, white count 10,500 with an ANC of 6100.  Absolute monocyte count was 2400. Plan was for follow-up in 3 months.  He was admitted to [facility] from 11/23/2018 - 11/28/2018 with community acquired left lower lobe pneumonia.  Baseline O2 of 5 L/min was increased to nonrebreather at 15 l/min.  He was also put on BiPAP.  He was treated with antibiotics, steroids, and nebulized treatments.  He was admitted to [facility] from 12/17/2018 - 12/21/2018 with a COPD exacerbation and a healthcare associated pneumonia.  He had sepsis due to an unspecified organism.  He was initially on the stepdown unit for BiPAP with high flow nasal cannula.  He desatted with limited motion.  Trilogy machine was to be set up for him at home.  He declined palliative care consult.  Prednisone was tapered from 30 to 15 mg a day.  He was treated with Maxipime and discharged on Omnicef.  Labs have been followed: 11/23/2018: Hematocrit 37.1, hemoglobin 12.2, MCV 103.3, platelets 200,000, white count 26,900. 11/28/2018: Hematocrit 30.6, hemoglobin 9.9, MCV 103.0, platelets 144,000, white count 18,000. 12/17/2018: Hematocrit 28.5, hemoglobin 9.6, MCV 101.1, platelets 80,000, white count 46,400.  ANC  was 30,400.  Absolute monocyte count was 9300   12/20/2018: Hematocrit 29.4, hemoglobin 9.6, MCV 103.2, platelets 44,000, white count 47,800.  Patient presented to the emergency room as he "could not breathe".  Oxygen sats were in the mid to low 60s.  He started noting shortness of breath about 4 to 5 days prior to admission.  States that he could not breathe with his "new machine" (trilogy).  He was on 5 L oxygen via nasal cannula.  Denies any sweats.  He notes a fever of 100 for couple of nights.  He notes chronic bruising.  He denies any bleeding.  He spends the majority of his day (80%) resting.   He is able to perform all of his activities of daily living.  He states that his house is handicap accessible.  He has elevator to bring him from the first to the second floor.  Chest CT angiogram on 12/30/2018 revealed no pulmonary embolism.  The left lower lobe consolidation on the most recent study has improved.  There was new patchy groundglass and airspace opacities in both lower lobes and the lingula.  He has chronic interstitial lung disease.  Labs revealed a hematocrit of 24.8, hemoglobin 8.1, MCV 107.8, platelets 12,000, white count 60,600 with an ANC of 31,800.  Absolute monocyte count was 13,900. Creatinine was 1.05.  Bilirubin was 1.6.  Lactic acid was 2.0.  COVID-19 testing was negative.  CBC today includes a hematocrit 21.4, hemoglobin 6.8, platelets 10,000, white count 56,700.  Creatinine is 1.17.    Past Medical History:  Diagnosis Date  .  Arthritis   . CAD (coronary artery disease)    a. inf MI 11/99 (in [place]) w/ PCI to [facility]; b. [place] 2004 [facility]): EF 50%, patent RCA stent, no obs dz; c. MV 2008: EF 42%, inf infarct, no ischemia; d. R/LHC 9/19: 3-v dz w/ mild dif dz LM, m-dLAD 50, dLAD 60, D2 80, RI 80, mRCA-1 90, mRCA-2 20, patent RCA stent, CO/CI 3.7/2  . Chronic combined systolic (congestive) and diastolic (congestive) heart failure ([place])    a. 7/29019 Echo: EF 25-30%, sev diff HK, mild  MR mod dil LA, mildly reduced RVSF, elevated CVP  . Chronic respiratory failure with hypoxia (HCC)    a. on 2L supplemental oxygen via [place] followed by pulmonology  . CML (chronic myelocytic leukemia) ([place])   . COPD (chronic obstructive pulmonary disease) ([place])   . Diabetes mellitus without complication ([place])   . Diabetic autonomic neuropathy associated with type 2 diabetes mellitus ([place]) 03/27/2015  . Diverticulosis 2002  . GERD (gastroesophageal reflux disease)   . HTN (hypertension)   . Hyperlipidemia   . ILD (interstitial lung disease) ([place])   . Ischemic cardiomyopathy   . Medical non-compliance   . Smoker   . Spinal stenosis   . TIA (transient ischemic attack)    a. s/p PFO closure 2004 (in [place])  . Ulcer     Past Surgical History:  Procedure Laterality Date  . BACK SURGERY     patient denies-just lumbar punctures  . BRONCHOSCOPY    . CARDIAC CATHETERIZATION  11/99   CI per PMH  . CATARACT EXTRACTION W/PHACO Right 11/24/2016   Procedure: CATARACT EXTRACTION PHACO AND INTRAOCULAR LENS PLACEMENT (IOC);  Surgeon: Birder Robson, MD;  Location: [facility] ORS;  Service: Ophthalmology;  Laterality: Right;  [place] 1:04.3 AP% 22.3 CDE 14.35 Fluid pack lot # [id number]  . CATARACT EXTRACTION W/PHACO Left 12/29/2016   Procedure: CATARACT EXTRACTION PHACO AND INTRAOCULAR LENS PLACEMENT (IOC);  Surgeon: Birder Robson, MD;  Location: [facility] ORS;  Service: Ophthalmology;  Laterality: Left;  [place] 00:52 AP% 18.5 CDE 9.67 Fluid pack lot # [id number]  . COLONOSCOPY    . COLONOSCOPY WITH PROPOFOL N/A 05/25/2017   Procedure: COLONOSCOPY WITH PROPOFOL;  Surgeon: Jonathon Bellows, MD;  Location: [facility] ENDOSCOPY;  Service: Gastroenterology;  Laterality: N/A;  . CORONARY ANGIOPLASTY     STENT  . CORONARY ARTERY BYPASS GRAFT     stent  . ESOPHAGOGASTRODUODENOSCOPY (EGD) WITH PROPOFOL N/A 05/25/2017   Procedure: ESOPHAGOGASTRODUODENOSCOPY (EGD) WITH PROPOFOL;  Surgeon: Jonathon Bellows, MD;  Location: [facility] ENDOSCOPY;   Service: Gastroenterology;  Laterality: N/A;  . EYE SURGERY    . FLEXIBLE BRONCHOSCOPY N/A 05/19/2017   Procedure: FLEXIBLE BRONCHOSCOPY;  Surgeon: Wilhelmina Mcardle, MD;  Location: [facility] ORS;  Service: Pulmonary;  Laterality: N/A;  . open heart surgery  2004   PFO repair  . RIGHT/LEFT HEART CATH AND CORONARY ANGIOGRAPHY N/A 05/03/2018   Procedure: RIGHT/LEFT HEART CATH AND CORONARY ANGIOGRAPHY;  Surgeon: Nelva Bush, MD;  Location: [place] CV LAB;  Service: Cardiovascular;  Laterality: N/A;  . UPPER GI ENDOSCOPY      Family History  Problem Relation Age of Onset  . Coronary artery disease Other        family hx  . Breast cancer Other        1st egree relative <50  . Cancer Mother   . Heart disease Father     Social History:  reports that he quit smoking about 11 months ago. His smoking use included  cigarettes. He has a 22.50 pack-year smoking history. He has never used smokeless tobacco. He reports previous alcohol use. He reports that he does not use drugs.   He lives with his wife, daughter, and her husband.  He is retired from [employer].  He is alone today.  Allergies:  Allergies  Allergen Reactions  . Prilosec [Omeprazole] Diarrhea    Medications Prior to Admission  Medication Sig Dispense Refill  . acetaminophen (TYLENOL) 500 MG tablet Take 1,000 mg by mouth daily as needed for moderate pain or headache.    Marland Kitchen aspirin 81 MG chewable tablet Chew 1 tablet (81 mg total) by mouth daily. 30 tablet 0  . atorvastatin (LIPITOR) 40 MG tablet TAKE 1 TABLET (40 MG TOTAL) BY MOUTH DAILY AT 6 PM. 30 tablet 0  . benzonatate (TESSALON) 200 MG capsule Take 1 capsule (200 mg total) by mouth 3 (three) times daily as needed for cough. 50 capsule 3  . blood glucose meter kit and supplies Dispense based on patient and insurance preference. Use to check blood sugar daily. 1 each 0  . budesonide (PULMICORT) 0.5 MG/2ML nebulizer solution Take 2 mLs (0.5 mg total) by nebulization 2  (two) times daily. 1440 mL 0  . carvedilol (COREG) 12.5 MG tablet Take 1 tablet (12.5 mg total) by mouth 2 (two) times daily. 180 tablet 3  . cetirizine (ZYRTEC) 10 MG chewable tablet Chew 10 mg by mouth daily.    . Cholecalciferol (VITAMIN D) 2000 units CAPS Take 2,000 Units by mouth every evening.    . formoterol (PERFOROMIST) 20 MCG/2ML nebulizer solution Take 2 mLs (20 mcg total) by nebulization 2 (two) times daily. J44.9 500 mL 12  . gabapentin (NEURONTIN) 300 MG capsule Take 1 capsule (300 mg total) by mouth at bedtime. 90 capsule 1  . guaiFENesin-codeine 100-10 MG/5ML syrup Take 5 mLs by mouth at bedtime as needed for cough. 180 mL 0  . insulin aspart (NOVOLOG) 100 UNIT/ML injection 5 units subcutaneous prior to meals (okay to substitute generic or what is covered by [employer]) (Patient taking differently: 8 units subcutaneous prior to meals (okay to substitute generic or what is covered by [employer])) 10 mL 1  . insulin glargine (LANTUS) 100 UNIT/ML injection Inject 0.2 mLs (20 Units total) into the skin daily. (Patient taking differently: Inject 25 Units into the skin daily. ) 10 mL 1  . ipratropium-albuterol (DUONEB) 0.5-2.5 (3) MG/3ML SOLN Take 3 mLs by nebulization every 4 (four) hours as needed. DX:J44.9 (Patient taking differently: Take 3 mLs by nebulization every 4 (four) hours as needed (for shortness of breath/wheezing). ) 1620 mL 5  . ivabradine (CORLANOR) 5 MG TABS tablet Take 1 tablet (5 mg total) by mouth 2 (two) times daily with a meal. 60 tablet 5  . LORazepam (ATIVAN) 0.5 MG tablet Take 1 tablet (0.5 mg total) by mouth 3 (three) times daily as needed for anxiety. 120 tablet 1  . losartan (COZAAR) 25 MG tablet Take 0.5 tablets (12.5 mg total) by mouth daily. 45 tablet 3  . Multiple Vitamin (MULTIVITAMIN WITH MINERALS) TABS tablet Take 1 tablet by mouth daily. 30 tablet 0  . nystatin (MYCOSTATIN) 100000 UNIT/ML suspension TAKE AS DIRECTED 5 MLS (500,000 UNITS TOTAL) IN THE MOUTH  OR THROAT 4 TIMES DAILY (Patient taking differently: Use as directed 5 mLs in the mouth or throat 4 (four) times daily. ) 946 mL 1  . ondansetron (ZOFRAN ODT) 4 MG disintegrating tablet Take 1-2 tablets (4-8 mg total) by  mouth every 6 (six) hours as needed for nausea or vomiting. 90 tablet 3  . spironolactone (ALDACTONE) 25 MG tablet TAKE 1 TABLET BY MOUTH EVERY DAY 30 tablet 0  . tiZANidine (ZANAFLEX) 4 MG tablet Take 1 tablet (4 mg total) by mouth Nightly. 90 tablet 3  . torsemide (DEMADEX) 20 MG tablet Take 2 tablets (40 mg total) by mouth daily. May also take 1 tablet (20 mg total) as needed (AS NEEDED FOR SWELLING). 270 tablet 3  . vitamin C (ASCORBIC ACID) 500 MG tablet Take 500 mg by mouth daily.      Review of Systems: GENERAL:  Fatigue.  Fever up to 100 for couple nights.  No sweats or weight loss (Baseline 184 pounds). PERFORMANCE STATUS (ECOG):  2-3 HEENT:  Runny nose.  No visual changes, sore throat, mouth sores or tenderness. Lungs:  Shortness of breath.  Chronic cough productive of "green swamp water".  No hemoptysis. Cardiac:  No chest pain, palpitations, orthopnea, or PND. GI:  Rare nausea once in awhile.  No vomiting, diarrhea, constipation, melena or hematochezia. GU:  No urgency, frequency, dysuria, or hematuria. Musculoskeletal:  No back pain.  No joint pain.  No muscle tenderness. Extremities:  No pain or swelling. Skin:  No rashes or skin changes. Neuro:  No headache, numbness or weakness, balance or coordination issues. Endocrine:  Diabetes.  No thyroid issues, hot flashes or night sweats. Psych:  No mood changes, depression or anxiety. Pain:  No focal pain. Review of systems:  All other systems reviewed and found to be negative.  Physical Exam:  Blood pressure (!) 112/47, pulse 73, temperature 97.7 F (36.5 C), temperature source Oral, resp. rate (!) 22, height _0  (1.803 m), weight 246 lb (111.6 kg), SpO2 92 %.  GENERAL:  Chronically ill appearing gentleman  sitting comfortably on the medical unit in mild respiratory distress. MENTAL STATUS:  Alert and oriented to person, place and time. HEAD:  Thin brown hair.  Goatee.  Male pattern baldness.  Normocephalic, atraumatic, face symmetric, no Cushingoid features. EYES:  Glasses.  Hazel/green eyes.  Pupils equal round and reactive to light and accomodation.  No conjunctivitis or scleral icterus. ENT:  [place] in place Oropharynx clear without lesion.  Tongue normal. Mucous membranes moist.  RESPIRATORY:  Coarse breath sounds throughout. CARDIOVASCULAR:  Regular rate and rhythm without murmur, rub or gallop. ABDOMEN:  Soft, non-tender, with active bowel sounds, and no appreciable hepatosplenomegaly.  No masses. SKIN:  Extensive upper extremity ecchymosis.  Scattered lower extremity petechiae.  No rashes, ulcers or lesions. EXTREMITIES: Bilateral 2+ below the knee pitting edema.  No skin discoloration or tenderness.  No palpable cords. LYMPH NODES: No palpable cervical, supraclavicular, axillary or inguinal adenopathy  NEUROLOGICAL: Unremarkable. PSYCH:  Appropriate.   Results for orders placed or performed during the hospital encounter of 12/23/2018 (from the past 48 hour(s))  Lactic acid, plasma     Status: Abnormal   Collection Time: 01/07/2019  1:07 PM  Result Value Ref Range   Lactic Acid, Venous 2.0 (HH) 0.5 - 1.9 mmol/L    Comment: CRITICAL RESULT CALLED TO, READ BACK BY AND VERIFIED WITH KIM GAULT ON 01/03/2019 AT 1346 QSD Performed at [facility], [address] [place], [place] [address]   Comprehensive metabolic panel     Status: Abnormal   Collection Time: 01/01/2019  1:08 PM  Result Value Ref Range   Sodium 135 135 - 145 mmol/L   Potassium 3.1 (L) 3.5 - 5.1 mmol/L  Chloride 92 (L) 98 - 111 mmol/L   CO2 33 (H) 22 - 32 mmol/L   Glucose, Bld 79 70 - 99 mg/dL   BUN 33 (H) 8 - 23 mg/dL   Creatinine, Ser 1.05 0.61 - 1.24 mg/dL   Calcium 8.6 (L) 8.9 - 10.3 mg/dL   Total Protein 7.0  6.5 - 8.1 g/dL   Albumin 3.4 (L) 3.5 - 5.0 g/dL   AST 41 15 - 41 U/L   ALT 34 0 - 44 U/L   Alkaline Phosphatase 80 38 - 126 U/L   Total Bilirubin 1.6 (H) 0.3 - 1.2 mg/dL   GFR calc non Af Amer >60 >60 mL/min   GFR calc Af Amer >60 >60 mL/min   Anion gap 10 5 - 15    Comment: Performed at [facility], [address] [place], [place] [address]  CBC with Differential     Status: Abnormal   Collection Time: 01/05/2019  1:08 PM  Result Value Ref Range   WBC 60.6 (HH) 4.0 - 10.5 K/uL    Comment: This critical result has verified and been called to KIM GAULT by Tennis Must on 05 23 2020 at 1437, and has been read back.    RBC 2.30 (L) 4.22 - 5.81 MIL/uL   Hemoglobin 8.1 (L) 13.0 - 17.0 g/dL   HCT 24.8 (L) 39.0 - 52.0 %   MCV 107.8 (H) 80.0 - 100.0 fL   MCH 35.2 (H) 26.0 - 34.0 pg   MCHC 32.7 30.0 - 36.0 g/dL   RDW 17.0 (H) 11.5 - 15.5 %   Platelets 12 (LL) 150 - 400 K/uL    Comment: Immature Platelet Fraction may be clinically indicated, consider ordering this additional test [id number] THIS CRITICAL RESULT HAS VERIFIED AND BEEN CALLED TO KIM GAULT BY STEPHON CAPERS ON 05 23 2020 AT 1437, AND HAS BEEN READ BACK.     nRBC 0.4 (H) 0.0 - 0.2 %   Neutrophils Relative % 53 %   Neutro Abs 31.8 (H) 1.7 - 7.7 K/uL   Lymphocytes Relative 12 %   Lymphs Abs 7.5 (H) 0.7 - 4.0 K/uL   Monocytes Relative 23 %   Monocytes Absolute 13.9 (H) 0.1 - 1.0 K/uL   Eosinophils Relative 0 %   Eosinophils Absolute 0.0 0.0 - 0.5 K/uL   Basophils Relative 0 %   Basophils Absolute 0.2 (H) 0.0 - 0.1 K/uL   WBC Morphology      MODERATE LEFT SHIFT (>5% METAS AND MYELOS,OCC PRO NOTED)   RBC Morphology MORPHOLOGY UNREMARKABLE    Smear Review PLATELETS APPEAR DECREASED    Immature Granulocytes 12 %   Abs Immature Granulocytes 7.30 (H) 0.00 - 0.07 K/uL    Comment: Performed at [facility], [address] [place], [place] [address]  Protime-INR     Status: None   Collection Time: 01/02/2019   1:08 PM  Result Value Ref Range   Prothrombin Time 14.7 11.4 - 15.2 seconds   INR 1.2 0.8 - 1.2    Comment: (NOTE) INR goal varies based on device and disease states. Performed at [facility], [address] [place], [place] [address]   Culture, blood (Routine x 2)     Status: None (Preliminary result)   Collection Time: 12/22/2018  1:08 PM  Result Value Ref Range   Specimen Description BLOOD LEFT ARM    Special Requests      BOTTLES DRAWN AEROBIC AND ANAEROBIC Blood Culture adequate volume  Culture      NO GROWTH < 24 HOURS Performed at [facility], [address] [place], [place] [address]    Report Status PENDING   Culture, blood (Routine x 2)     Status: None (Preliminary result)   Collection Time: 01/07/2019  1:08 PM  Result Value Ref Range   Specimen Description BLOOD LAC    Special Requests      BOTTLES DRAWN AEROBIC AND ANAEROBIC Blood Culture adequate volume   Culture      NO GROWTH < 24 HOURS Performed at [facility], [address] [place], [place] [address]    Report Status PENDING   SARS Coronavirus 2 (CEPHEID- Performed in [place] hospital lab), Hosp Order     Status: None   Collection Time: 12/10/2018  1:08 PM  Result Value Ref Range   SARS Coronavirus 2 NEGATIVE NEGATIVE    Comment: (NOTE) If result is NEGATIVE SARS-CoV-2 target nucleic acids are NOT DETECTED. The SARS-CoV-2 RNA is generally detectable in upper and lower  respiratory specimens during the acute phase of infection. The lowest  concentration of SARS-CoV-2 viral copies this assay can detect is 250  copies / mL. A negative result does not preclude SARS-CoV-2 infection  and should not be used as the sole basis for treatment or other  patient management decisions.  A negative result may occur with  improper specimen collection / handling, submission of specimen other  than nasopharyngeal swab, presence of viral mutation(s) within the  areas targeted by this  assay, and inadequate number of viral copies  (<250 copies / mL). A negative result must be combined with clinical  observations, patient history, and epidemiological information. If result is POSITIVE SARS-CoV-2 target nucleic acids are DETECTED. The SARS-CoV-2 RNA is generally detectable in upper and lower  respiratory specimens dur ing the acute phase of infection.  Positive  results are indicative of active infection with SARS-CoV-2.  Clinical  correlation with patient history and other diagnostic information is  necessary to determine patient infection status.  Positive results do  not rule out bacterial infection or co-infection with other viruses. If result is PRESUMPTIVE POSTIVE SARS-CoV-2 nucleic acids MAY BE PRESENT.   A presumptive positive result was obtained on the submitted specimen  and confirmed on repeat testing.  While 2019 novel coronavirus  (SARS-CoV-2) nucleic acids may be present in the submitted sample  additional confirmatory testing may be necessary for epidemiological  and / or clinical management purposes  to differentiate between  SARS-CoV-2 and other Sarbecovirus currently known to infect humans.  If clinically indicated additional testing with an alternate test  methodology [phone]) is advised. The SARS-CoV-2 RNA is generally  detectable in upper and lower respiratory sp ecimens during the acute  phase of infection. The expected result is Negative. Fact Sheet for Patients:  StrictlyIdeas.no Fact Sheet for Healthcare Providers: BankingDealers.co.za This test is not yet approved or cleared by the [place] FDA and has been authorized for detection and/or diagnosis of SARS-CoV-2 by FDA under an Emergency Use Authorization (EUA).  This EUA will remain in effect (meaning this test can be used) for the duration of the COVID-19 declaration under Section 564(b)(1) of the Act, 21 U.S.C. section 360bbb-3(b)(1),  unless the authorization is terminated or revoked sooner. Performed at [facility], [address], [place], [place] [address]   Brain natriuretic peptide     Status: Abnormal   Collection Time: 12/27/2018  1:08 PM  Result Value Ref  Range   B Natriuretic Peptide 147.0 (H) 0.0 - 100.0 pg/mL    Comment: Performed at [facility], [address] [place], [name] [address]  Pathologist smear review     Status: None   Collection Time: 12/16/2018  1:08 PM  Result Value Ref Range   Path Review      Blood smear and history reviewed, including previous path review on smear from May 9.    Comment: Thrombocytopenia has become critical. Macrocytic anemia has worsened, and there are now a few nucleated RBCs. Leukocytosis has increased, and there is an increased proportion of immature monocytes. A rare blast is now noted. 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  Reviewed by Lemmie Evens. Dicie Beam, MD. Performed at [facility], [address] [place], [place] [address]   Lactic acid, plasma     Status: Abnormal   Collection Time: 12/26/2018  3:44 PM  Result Value Ref Range   Lactic Acid, Venous 2.0 (HH) 0.5 - 1.9 mmol/L    Comment: CRITICAL RESULT CALLED TO, READ BACK BY AND VERIFIED WITH KIM GAULT AT 1626 12/24/2018.PMF Performed at [facility], [address] [place], [place] [address]   Urinalysis, Complete w Microscopic     Status: Abnormal   Collection Time: 12/23/2018  3:44 PM  Result Value Ref Range   Color, Urine YELLOW (A) YELLOW   APPearance HAZY (A) CLEAR   Specific Gravity, Urine 1.018 1.005 - 1.030   pH 5.0 5.0 - 8.0   Glucose, UA 50 (A) NEGATIVE mg/dL   Hgb urine dipstick NEGATIVE NEGATIVE   Bilirubin Urine NEGATIVE NEGATIVE   Ketones, ur NEGATIVE NEGATIVE mg/dL   Protein, ur 30 (A) NEGATIVE mg/dL   Nitrite NEGATIVE NEGATIVE   Leukocytes,Ua NEGATIVE NEGATIVE   RBC / HPF 0-5 0 - 5 RBC/hpf   WBC, UA 0-5 0 -  5 WBC/hpf   Bacteria, UA NONE SEEN NONE SEEN   Squamous Epithelial / LPF NONE SEEN 0 - 5   Mucus PRESENT     Comment: Performed at [facility], [address] [place], [place] [address]  MRSA PCR Screening     Status: None   Collection Time: 12/23/2018  6:47 PM  Result Value Ref Range   MRSA by PCR NEGATIVE NEGATIVE    Comment:        The GeneXpert MRSA Assay (FDA approved for NASAL specimens only), is one component of a comprehensive MRSA colonization surveillance program. It is not intended to diagnose MRSA infection nor to guide or monitor treatment for MRSA infections. Performed at [facility], [address] [place], [place] [address]   Glucose, capillary     Status: Abnormal   Collection Time: 01/05/2019  9:33 PM  Result Value Ref Range   Glucose-Capillary 265 (H) 70 - 99 mg/dL  Basic metabolic panel     Status: Abnormal   Collection Time: 01/01/19  5:15 AM  Result Value Ref Range   Sodium 134 (L) 135 - 145 mmol/L   Potassium 4.1 3.5 - 5.1 mmol/L   Chloride 94 (L) 98 - 111 mmol/L   CO2 27 22 - 32 mmol/L   Glucose, Bld 258 (H) 70 - 99 mg/dL   BUN 44 (H) 8 - 23 mg/dL   Creatinine, Ser 1.17 0.61 - 1.24 mg/dL   Calcium 8.2 (L) 8.9 - 10.3 mg/dL   GFR calc non Af Amer >60 >60 mL/min   GFR calc Af  Amer >60 >60 mL/min   Anion gap 13 5 - 15    Comment: Performed at [facility], [address] [place], [place] [address]  CBC     Status: Abnormal   Collection Time: 01/01/19  5:15 AM  Result Value Ref Range   WBC 56.7 (HH) 4.0 - 10.5 K/uL    Comment: CRITICAL VALUE NOTED.  VALUE IS CONSISTENT WITH PREVIOUSLY REPORTED AND CALLED VALUE.   RBC 1.97 (L) 4.22 - 5.81 MIL/uL   Hemoglobin 6.8 (L) 13.0 - 17.0 g/dL   HCT 21.4 (L) 39.0 - 52.0 %   MCV 108.6 (H) 80.0 - 100.0 fL   MCH 34.5 (H) 26.0 - 34.0 pg   MCHC 31.8 30.0 - 36.0 g/dL   RDW 17.2 (H) 11.5 - 15.5 %   Platelets 10 (LL) 150 - 400 K/uL    Comment: Immature Platelet Fraction may  be clinically indicated, consider ordering this additional test [id number] CRITICAL VALUE NOTED.  VALUE IS CONSISTENT WITH PREVIOUSLY REPORTED AND CALLED VALUE.    nRBC 0.1 0.0 - 0.2 %    Comment: Performed at [facility], [address] [place], [place] [address]  Glucose, capillary     Status: Abnormal   Collection Time: 01/01/19  7:26 AM  Result Value Ref Range   Glucose-Capillary 228 (H) 70 - 99 mg/dL  Glucose, capillary     Status: Abnormal   Collection Time: 01/01/19 11:54 AM  Result Value Ref Range   Glucose-Capillary 310 (H) 70 - 99 mg/dL  APTT     Status: None   Collection Time: 01/01/19 12:54 PM  Result Value Ref Range   aPTT 36 24 - 36 seconds    Comment: Performed at [facility], [address], [place], [place] [address]  Fibrinogen     Status: None   Collection Time: 01/01/19 12:54 PM  Result Value Ref Range   Fibrinogen 328 210 - 475 mg/dL    Comment: Performed at [facility], [place], [place] [address]   Ct Angio Chest Pe W And/or Wo Contrast  Result Date: 01/08/2019 CLINICAL DATA:  Worsening cough and shortness of breath. Recent hospitalization for pneumonia. EXAM: CT ANGIOGRAPHY CHEST WITH CONTRAST TECHNIQUE: Multidetector CT imaging of the chest was performed using the standard protocol during bolus administration of intravenous contrast. Multiplanar CT image reconstructions and MIPs were obtained to evaluate the vascular anatomy. CONTRAST:  12m OMNIPAQUE IOHEXOL 350 MG/ML SOLN COMPARISON:  Radiographs today.  CT 11/23/2018 and 05/13/2017. FINDINGS: Cardiovascular: The pulmonary arteries are well opacified with contrast to the level of the subsegmental branches. There is no evidence of acute pulmonary embolism. Extensive coronary artery atherosclerosis again noted with lesser involvement the aorta and great vessels. No acute vascular findings. There are calcifications the aortic valve. The heart size is normal. There is  no pericardial effusion. Mediastinum/Nodes: There are no enlarged mediastinal, hilar or axillary lymph nodes.Stable small mediastinal lymph nodes. The thyroid gland, trachea and esophagus appear unchanged. Lungs/Pleura: There is no pleural effusion or pneumothorax. 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 There are moderate emphysematous changes. The left lower lobe consolidation seen on the most recent study has improved, although there are new patchy ground-glass and airspace opacities throughout both lower lobes and the lingula. These demonstrate a nonspecific distribution. Upper abdomen: The visualized upper abdomen appears stable without suspicious findings.  Borderline hepatic steatosis. Musculoskeletal/Chest wall: There is no chest wall mass or suspicious osseous finding. Review of the MIP images confirms the above findings. IMPRESSION: 1. No evidence of acute pulmonary embolism. 2. The left lower lobe consolidation seen on the most recent study has improved. However, there are new patchy ground-glass and airspace opacities in both lower lobes and the lingula. These have a nonspecific distribution and could reflect bacterial or viral infection. COVID-19 testing today was negative. 3. Underlying chronic interstitial lung disease, likely due to a combination emphysema and other smoking related lung disease such as Langerhans cell histiocytosis as correlated with prior studies. 4. Coronary and Aortic Atherosclerosis (ICD10-I70.0). Electronically Signed   By: Richardean Sale M.D.   On: 12/29/2018 15:46   Dg Chest Portable 1 View  Result Date: 01/02/2019 CLINICAL DATA:  Cough and worsening shortness of breath. EXAM: PORTABLE CHEST 1 VIEW COMPARISON:  12/17/2018 FINDINGS: Prior median sternotomy is again noted. The cardiomediastinal silhouette is unchanged with normal heart  size. Chronic interstitial densities throughout both lungs are unchanged. No acute airspace consolidation, overt pulmonary edema, pleural effusion, pneumothorax is identified. No acute osseous abnormality is seen. IMPRESSION: Chronic interstitial lung disease without evidence of acute abnormality. Electronically Signed   By: Logan Bores M.D.   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  Chest CT angiogram on 12/30/2018 revealed no pulmonary embolism.  The left lower lobe consolidation on the most recent study has improved.  There was new patchy groundglass and airspace opacities in both lower lobes and the lingula.  He has chronic interstitial lung disease.  Symptomatically, he is short of breath.  He has a productive cough with green sputum.  He has extensive upper extremity ecchymosis.  Hemoglobin is 6.8.  Platelets 10,000.  WBC 56,700.  Plan:   1.   CMML-1  Patient's bone marrow on 08/30/2015 and 03/08/2017 confirmed underlying myeloproliferative/myelodysplastic disorder (CMML).  Peripheral smear today reveals leukocytosis with neutrophils, monocytes, and some blasts.  Rare fragmented RBC.  No platelet clumping.   Patient noted increased leukocyte count with infection.  Suspect some increased WBC count due to steroids and WBC demargination.  Thrombocytopenia in part likely due to CMML.  Discuss consideration of treatment after recovery from pneumonia.  Peripheral blood for flow cytometry in AM. 2.  Thrombocytopenia  Etiology likely multifactorial and due to CMML, underlying infection (increased consumption).  No evidence of DIC (PT, PTT, fibrinogen are normal).  No evidence of a microangiopathic process.  Platelet count decreasing with evidence of bruising but no bleeding.   Recheck platelet count.  Maintain platelets > 10,000.  Blood products should be leukopoor and irradiated. 3.    Anemia  Etiology likely multi-factorial.  Ferritin was 983 on 12/17/2018 (likley acute phase reactant).  RBCs are macrocytic.   B12 was 761 on 12/17/2018.   Check folate and TSH  Guaiac all stool.  Maintain hemoglobin > 7-8.  Blood products should be leukopoor and irradiated.   Thank you for allowing me to participate in JERELLE VIRDEN 's care.  I will follow him closely with you while hospitalized.  Dr Rogue Bussing is covering tomorrow.  He will be followed in the outpatient clinic by his oncologist, Dr Grayland Ormond.   Lequita Asal, MD  01/01/2019, 2:14 PM

## 2019-01-01 NOTE — TOC Initial Note (Addendum)
Transition of Care [facility]) - Initial/Assessment Note    Patient Details  Name: Tony Watson MRN: [id number] Date of Birth: [birth year]  Transition of Care [facility]) CM/SW Contact:    Latanya Maudlin, RN Phone Number: 01/01/2019, 8:31 AM  Clinical Narrative:    [facility] team assessed patient as he meets high risk readmission criteria. Assessment is somewhat limited as patient is visibly short of breath with speech and is on high flow nasal canula.Patient lives at home with his spouse, had previously refused home health since he did not want anyone entering the home for visits but ultimately was agreeable to home health last admission via [place] [facility], Corene Cornea aware of admission. [facility] program. Patient on 4L O2 chronically through [employer] and was able to obtain NIV last admission, possibly working towards aflo vest . Also has rolling walker, wheelchair, and power chair. PCP is [facility], utilizes HF clinic as well. Patient refused outpatient palliative last admission but per nursing this may be needed as his ocerall condition appears worsened.[facility] delivers medications but uses [facility].                               Expected Discharge Plan: [place] Barriers to Discharge: Continued Medical Work up   Patient Goals and CMS Choice   CMS Medicare.gov Compare Post Acute Care list provided to:: Patient    Expected Discharge Plan and Services Expected Discharge Plan: [place]   Discharge Planning Services: CM Consult, HF Clinic Post Acute Care Choice: Resumption of Svcs/PTA Provider Living arrangements for the past 2 months: Single Family Home                           HH Arranged: RN, PT [facility] Agency: [place] ([facility]) Date [place]: 12/29/2018 Time HH Agency Contacted: 1600 Representative spoke with at [place]: [place] Arrangements/Services Living  arrangements for the past 2 months: [place] with:: Spouse Patient language and need for interpreter reviewed:: Yes Do you feel safe going back to the place where you live?: Yes          Current home services: DME, Home PT, Home RN Criminal Activity/Legal Involvement Pertinent to Current Situation/Hospitalization: No - Comment as needed  Activities of Daily Living Home Assistive Devices/Equipment: Wheelchair, Environmental consultant (specify type), Cane (specify quad or straight), Eyeglasses ADL Screening (condition at time of admission) Patient's cognitive ability adequate to safely complete daily activities?: Yes Is the patient deaf or have difficulty hearing?: No Does the patient have difficulty seeing, even when wearing glasses/contacts?: No Does the patient have difficulty concentrating, remembering, or making decisions?: No Patient able to express need for assistance with ADLs?: Yes Does the patient have difficulty dressing or bathing?: No Independently performs ADLs?: Yes (appropriate for developmental age) Does the patient have difficulty walking or climbing stairs?: Yes Weakness of Legs: Both Weakness of Arms/Hands: None  Permission Sought/Granted Permission sought to share information with : Case Manager                Emotional Assessment              Admission diagnosis:  HCAP (healthcare-associated pneumonia) [J18.9] Acute on chronic respiratory failure with hypoxia ([place]) [J96.21] Patient Active Problem List   Diagnosis Date Noted  .  Acute on chronic respiratory failure ([place]) 12/17/2018  . Chronic systolic heart failure ([place]) 05/13/2018  . HTN (hypertension) 05/13/2018  . ILD (interstitial lung disease) ([place])   . DNR / DNI, Advanced Directives Counselling 04/27/2018 04/28/2018    Class: Acute  . Ischemic cardiomyopathy 03/24/2018  . Protein-calorie malnutrition, severe 03/03/2018  . 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 Diabetic autonomic neuropathy associated with type 2 diabetes mellitus ([place]) 03/27/2015  . History of inferior MI (myocardial infarction) 10/14/2013  . CAD (coronary artery disease), native coronary artery   . History of noncompliance with medical treatment 05/30/2012  . Stage 4 very severe COPD by GOLD classification ([place]) 03/23/2011  . ALLERGIC RHINITIS CAUSE UNSPECIFIED 07/31/2010  . Type 2 diabetes mellitus with vascular disease ([place]) 01/02/2010  . Ex-smoker 04/17/2009  . TRANSIENT ISCHEMIC ATTACKS, HX OF 04/17/2009  . Hyperlipidemia 12/19/2008   PCP:  Owens Loffler, MD Pharmacy:   [id number] [place], [address] [place] [address] Phone: [phone] Fax: [phone]     Social Determinants of Health (SDOH) Interventions    Readmission Risk Interventions Readmission Risk Prevention Plan 01/01/2019 12/18/2018 11/28/2018  Transportation Screening Complete Complete Complete  PCP or Specialist Appt within 3-5 Days - - Complete  HRI or Home Care Consult - - Complete  Medication Review ([place]) Complete Complete Complete  PCP or Specialist appointment within 3-5 days of discharge - - -  [place] or Home Care Consult Complete Complete -  SW Recovery [place] - - -  Medication Reconcilation ([place] - - -  Some recent data might be hidden

## 2019-01-01 NOTE — Progress Notes (Signed)
[place] [facility] [place] NAME: Tony Watson    MR#:  [id number]  DATE OF BIRTH:  07/10/1951  SUBJECTIVE:  CHIEF COMPLAINT:   Chief Complaint  Patient presents with  . Shortness of Breath   Recurrent admission with COPD.  Chronic 5 L oxygen use at home.  Came with worsening respiratory status for last 3 to 4 days.  Noted to have some pneumonia.  Have drop in his blood counts also.  REVIEW OF SYSTEMS:  CONSTITUTIONAL: No fever, fatigue or weakness.  EYES: No blurred or double vision.  EARS, NOSE, AND THROAT: No tinnitus or ear pain.  RESPIRATORY: have cough, shortness of breath, wheezing ,no hemoptysis.  CARDIOVASCULAR: No chest pain, orthopnea, edema.  GASTROINTESTINAL: No nausea, vomiting, diarrhea or abdominal pain.  GENITOURINARY: No dysuria, hematuria.  ENDOCRINE: No polyuria, nocturia,  HEMATOLOGY: No anemia, easy bruising or bleeding SKIN: No rash or lesion. MUSCULOSKELETAL: No joint pain or arthritis.   NEUROLOGIC: No tingling, numbness, weakness.  PSYCHIATRY: No anxiety or depression.   ROS  DRUG ALLERGIES:   Allergies  Allergen Reactions  . Prilosec [Omeprazole] Diarrhea    VITALS:  Blood pressure (!) 119/95, pulse 100, temperature 98.4 F (36.9 C), temperature source Oral, resp. rate (!) 23, height 5\' 11"  (1.803 m), weight 111.6 kg, SpO2 92 %.  PHYSICAL EXAMINATION:  GENERAL:  68 y.o.-year-old patient lying in the bed with no acute distress.  EYES: Pupils equal, round, reactive to light and accommodation. No scleral icterus. Extraocular muscles intact.  HEENT: Head atraumatic, normocephalic. Oropharynx and nasopharynx clear.  NECK:  Supple, no jugular venous distention. No thyroid enlargement, no tenderness.  LUNGS: Normal breath sounds bilaterally, b/l wheezing, have some crepitation. No use of accessory muscles of respiration.  CARDIOVASCULAR: S1, S2 normal. No murmurs, rubs, or gallops.  ABDOMEN: Soft, nontender,  nondistended. Bowel sounds present. No organomegaly or mass.  EXTREMITIES: No pedal edema, cyanosis, or clubbing.  NEUROLOGIC: Cranial nerves II through XII are intact. Muscle strength 4/5 in all extremities. Sensation intact. Gait not checked.  PSYCHIATRIC: The patient is alert and oriented x 3.  SKIN: No obvious rash, lesion, or ulcer.   Physical Exam LABORATORY PANEL:   CBC Recent Labs  Lab 01/01/19 0515  WBC 56.7*  HGB 6.8*  HCT 21.4*  PLT 10*   ------------------------------------------------------------------------------------------------------------------  Chemistries  Recent Labs  Lab 12/25/2018 1308 01/01/19 0515  NA 135 134*  K 3.1* 4.1  CL 92* 94*  CO2 33* 27  GLUCOSE 79 258*  BUN 33* 44*  CREATININE 1.05 1.17  CALCIUM 8.6* 8.2*  AST 41  --   ALT 34  --   ALKPHOS 80  --   BILITOT 1.6*  --    ------------------------------------------------------------------------------------------------------------------  Cardiac Enzymes No results for input(s): TROPONINI in the last 168 hours. ------------------------------------------------------------------------------------------------------------------  RADIOLOGY:  Ct Angio Chest Pe W And/or Wo Contrast  Result Date: 12/25/2018 CLINICAL DATA:  Worsening cough and shortness of breath. Recent hospitalization for pneumonia. EXAM: CT ANGIOGRAPHY CHEST WITH CONTRAST TECHNIQUE: Multidetector CT imaging of the chest was performed using the standard protocol during bolus administration of intravenous contrast. Multiplanar CT image reconstructions and MIPs were obtained to evaluate the vascular anatomy. CONTRAST:  27mL OMNIPAQUE IOHEXOL 350 MG/ML SOLN COMPARISON:  Radiographs today.  CT 11/23/2018 and 05/13/2017. FINDINGS: Cardiovascular: The pulmonary arteries are well opacified with contrast to the level of the subsegmental branches. There is no evidence of acute pulmonary embolism. Extensive coronary artery atherosclerosis again  noted with lesser involvement the aorta and great vessels. No acute vascular findings. There are calcifications the aortic valve. The heart size is normal. There is no pericardial effusion. Mediastinum/Nodes: There are no enlarged mediastinal, hilar or axillary lymph nodes.Stable small mediastinal lymph nodes. The thyroid gland, trachea and esophagus appear unchanged. Lungs/Pleura: There is no pleural effusion or pneumothorax. 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 There are moderate emphysematous changes. The left lower lobe consolidation seen on the most recent study has improved, although there are new patchy ground-glass and airspace opacities throughout both lower lobes and the lingula. These demonstrate a nonspecific distribution. Upper abdomen: The visualized upper abdomen appears stable without suspicious findings. Borderline hepatic steatosis. Musculoskeletal/Chest wall: There is no chest wall mass or suspicious osseous finding. Review of the MIP images confirms the above findings. IMPRESSION: 1. No evidence of acute pulmonary embolism. 2. The left lower lobe consolidation seen on the most recent study has improved. However, there are new patchy ground-glass and airspace opacities in both lower lobes and the lingula. These have a nonspecific distribution and could reflect bacterial or viral infection. COVID-19 testing today was negative. 3. Underlying chronic interstitial lung disease, likely due to a combination emphysema and other smoking related lung disease such as Langerhans cell histiocytosis as correlated with prior studies. 4. Coronary and Aortic Atherosclerosis (ICD10-I70.0). Electronically Signed   By: Richardean Sale M.D.   On: 12/21/2018 15:46   Dg Chest Portable 1 View  Result Date: 12/16/2018 CLINICAL DATA:  Cough and worsening shortness of breath.  EXAM: PORTABLE CHEST 1 VIEW COMPARISON:  12/17/2018 FINDINGS: Prior median sternotomy is again noted. The cardiomediastinal silhouette is unchanged with normal heart size. Chronic interstitial densities throughout both lungs are unchanged. No acute airspace consolidation, overt pulmonary edema, pleural effusion, pneumothorax is identified. No acute osseous abnormality is seen. IMPRESSION: Chronic interstitial lung disease without evidence of acute abnormality. Electronically Signed   By: Logan Bores M.D.   On: 12/11/2018 13:22    ASSESSMENT AND PLAN:   Active Problems:   Acute on chronic respiratory failure ([place])  68 year old male with past medical history of COPD, interstitial lung disease, diabetes, diabetic neuropathy, hypertension, recent admission for sepsis/respiratory failure with pneumonia who presents to the hospital due to worsening shortness of breath and hypoxemia.  1.  Acute on chronic respiratory failure with hypoxia- secondary to pneumonia with underlying COPD/interstitial lung disease exacerbation. - Patient's O2 requirements at home had increased from 5 to 8 L and he was noted to be significantly hypoxemic in the ER. - Continue O2 supplementation,  treat underlying pneumonia and COPD with broad-spectrum IV antibiotics with vancomycin, cefepime.  Give the patient IV steroids, scheduled duo nebs, Pulmicort nebs for his COPD. - Wean off oxygen as tolerated. -Due to recurrent admissions and terminal COPD I have called pulmonary consult.  2.  COPD/interstitial lung disease exacerbation - secondary to pneumonia. - We will treat patient with IV steroids, scheduled duo nebs, Pulmicort nebs.  Continue empiric antibiotics with vancomycin, cefepime.  Follow cultures.  3.  Pneumonia-source of patient's worsening respiratory failure and COPD exacerbation. -Continue IV vancomycin, cefepime.  Follow cultures.  Patient's COVID-19 test was negative.  4.  Leukocytosis-secondary to his  underlying CML it is somewhat worse than his baseline. -Follow white cell count with IV antibiotic therapy.  5.  Thrombocytopenia-secondary to underlying CML. - Hold off heparin products, follow  platelet count.   -I have called oncology consult as patient's platelet count where > 50 10 days ago and there is significant drop since then.  6.  Diabetes type 2 with neuropathy- continue patient's Lantus, sliding scale insulin. -Follow blood sugars.  7.  History of CML-patient follows with Dr. Grayland Ormond. -Continue Corlanor.  -Called oncology consult.  8.  Diabetic neuropathy-continue gabapentin.  9.  Essential hypertension-continue carvedilol, losartan, Aldactone.  10.  Hyperlipidemia-continue atorvastatin.  11.  GERD-continue Protonix.  Patient's prognosis is guarded to poor given his recurrent hospitalizations and multiple comorbidities.  Patient is a DNR.    Will call palliative care consult for goals of care.   All the records are reviewed and case discussed with Care Management/Social Workerr. Management plans discussed with the patient, family and they are in agreement.  CODE STATUS: DNR  TOTAL TIME TAKING CARE OF THIS PATIENT: 35 minutes.     POSSIBLE D/C IN 1-2 DAYS, DEPENDING ON CLINICAL CONDITION.   Vaughan Basta M.D on 01/01/2019   Between 7am to 6pm - Pager - [phone]  After 6pm go to [email or web address] - password [email or web address] [facility]  Office  [phone]  CC: Primary care physician; Owens Loffler, MD  Note: This dictation was prepared with Dragon dictation along with smaller phrase technology. Any transcriptional errors that result from this process are unintentional.

## 2019-01-01 NOTE — Progress Notes (Addendum)
Per DR. Corcoran- give PRBC now and  transfuse Platelet if drop further < 10, check today evening.  ALL BLOOD PRODUCTS NEED TO BE LEUKOPOOR AND IRRADIATED PER ONCOLOGIST.

## 2019-01-01 NOTE — Telephone Encounter (Signed)
Tony Watson is currently admitted

## 2019-01-02 DIAGNOSIS — D6959 Other secondary thrombocytopenia: Secondary | ICD-10-CM

## 2019-01-02 DIAGNOSIS — D630 Anemia in neoplastic disease: Secondary | ICD-10-CM

## 2019-01-02 DIAGNOSIS — J441 Chronic obstructive pulmonary disease with (acute) exacerbation: Secondary | ICD-10-CM

## 2019-01-02 DIAGNOSIS — J9690 Respiratory failure, unspecified, unspecified whether with hypoxia or hypercapnia: Secondary | ICD-10-CM

## 2019-01-02 LAB — TYPE AND SCREEN
ABO/RH(D): O POS
Antibody Screen: NEGATIVE
Unit division: 0

## 2019-01-02 LAB — BPAM RBC
Blood Product Expiration Date: 202006032359
ISSUE DATE / TIME: 202005241801
Unit Type and Rh: 9500

## 2019-01-02 LAB — PREPARE RBC (CROSSMATCH)

## 2019-01-02 LAB — CBC
HCT: 24.6 % — ABNORMAL LOW (ref 39.0–52.0)
Hemoglobin: 8.1 g/dL — ABNORMAL LOW (ref 13.0–17.0)
MCH: 33.8 pg (ref 26.0–34.0)
MCHC: 32.9 g/dL (ref 30.0–36.0)
MCV: 102.5 fL — ABNORMAL HIGH (ref 80.0–100.0)
Platelets: 16 10*3/uL — CL (ref 150–400)
RBC: 2.4 MIL/uL — ABNORMAL LOW (ref 4.22–5.81)
RDW: 18.7 % — ABNORMAL HIGH (ref 11.5–15.5)
WBC: 66.3 10*3/uL (ref 4.0–10.5)
nRBC: 0.1 % (ref 0.0–0.2)

## 2019-01-02 LAB — FOLATE: Folate: 15.4 ng/mL (ref >5.9)

## 2019-01-02 LAB — TSH: TSH: 0.28 u[IU]/mL — ABNORMAL LOW (ref 0.350–4.500)

## 2019-01-02 LAB — GLUCOSE, CAPILLARY
Glucose-Capillary: 341 mg/dL — ABNORMAL HIGH (ref 70–99)
Glucose-Capillary: 308 mg/dL — ABNORMAL HIGH (ref 70–99)
Glucose-Capillary: 279 mg/dL — ABNORMAL HIGH (ref 70–99)
Glucose-Capillary: 334 mg/dL — ABNORMAL HIGH (ref 70–99)

## 2019-01-02 MED ORDER — NYSTATIN 100000 UNIT/ML MT SUSP
5.0000 mL | Freq: Four times a day (QID) | OROMUCOSAL | Status: DC
  Administered 2019-01-02 – 2019-01-04 (×10): 500000 [IU] via ORAL
  Filled 2019-01-02 (×10): qty 5

## 2019-01-02 MED ORDER — LORAZEPAM 2 MG/ML IJ SOLN
1.0000 mg | Freq: Once | INTRAMUSCULAR | Status: AC
  Administered 2019-01-02: 1 mg via INTRAVENOUS
  Filled 2019-01-02: qty 1

## 2019-01-02 MED ORDER — SODIUM CHLORIDE 0.9 % IV SOLN
2.0000 g | Freq: Three times a day (TID) | INTRAVENOUS | Status: DC
  Administered 2019-01-02 – 2019-01-04 (×6): 2 g via INTRAVENOUS
  Filled 2019-01-02 (×8): qty 2

## 2019-01-02 MED ORDER — LORAZEPAM 1 MG PO TABS
1.0000 mg | ORAL_TABLET | Freq: Three times a day (TID) | ORAL | Status: DC | PRN
  Administered 2019-01-02 – 2019-01-04 (×4): 1 mg via ORAL
  Filled 2019-01-02 (×4): qty 1

## 2019-01-02 NOTE — Progress Notes (Signed)
Tony Watson   DOB:06/19/1951   PT#:[id number]    Subjective: Patient sitting up to the bed.  Complains of shortness of breath.  Complains of cough.  Complains of poor appetite.  Is improving.  No fevers.  Objective:  Vitals:   01/02/19 1722 01/02/19 1916  BP: (!) 127/47 (!) 122/44  Pulse: 84 80  Resp: 19 18  Temp: (!) 97.4 F (36.3 C) (!) 97.5 F (36.4 C)  SpO2: 92% 91%     Intake/Output Summary (Last 24 hours) at 01/02/2019 1956 Last data filed at 01/02/2019 1900 Gross per 24 hour  Intake 900 ml  Output 1200 ml  Net -300 ml    Physical Exam  Constitutional: He is oriented to person, place, and time.  Patient sitting edge of the bed complaining of shortness of breath.  He continues needing 10 L of oxygen.  HENT:  Head: Normocephalic and atraumatic.  Mouth/Throat: Oropharynx is clear and moist. No oropharyngeal exudate.  Eyes: Pupils are equal, round, and reactive to light.  Neck: Normal range of motion. Neck supple.  Cardiovascular: Normal rate and regular rhythm.  Pulmonary/Chest: No respiratory distress. He has no wheezes.  Decreased air entry bilaterally in the lung fields.  Abdominal: Soft. Bowel sounds are normal. He exhibits no distension and no mass. There is no abdominal tenderness. There is no rebound and no guarding.  Musculoskeletal: Normal range of motion.        General: No tenderness or edema.  Neurological: He is alert and oriented to person, place, and time.  Skin: Skin is warm.  Bilateral multiple ecchymosis.  Psychiatric: Affect normal.     Labs:  Lab Results  Component Value Date   WBC 66.3 (HH) 01/02/2019   HGB 8.1 (L) 01/02/2019   HCT 24.6 (L) 01/02/2019   MCV 102.5 (H) 01/02/2019   PLT 16 (LL) 01/02/2019   NEUTROABS 31.8 (H) 12/29/2018    Lab Results  Component Value Date   NA 134 (L) 01/01/2019   K 4.1 01/01/2019   CL 94 (L) 01/01/2019   CO2 27 01/01/2019    Studies:  No results found.  Chronic myelomonocytic leukemia not  having achieved remission [facility]) #68 year old male patient with history of CMML under surveillance currently admitted to hospital for worsening shortness of breath/severe thrombocytopenia-worsening anemia/leukocytosis  #Severe anemia/thrombocytopenia worsening leukocytosis/context of diagnosis of CMML.  Patient currently on active surveillance.  Question patient candidate for palliative chemotherapy like hypo-methylating agents like Vidaza.  Defer to Dr. Grayland Ormond for further input.  #Advanced COPD/COPD exacerbation-baseline 5 L of oxygen.  Currently 10 L.  Agree with anti-biotcs/pulmonary recommendations.  #Unfortunately poor prognosis-CMML especially in the context of advanced COPD/respiratory failure.  Discussed with Dr. Anselm Jungling.  Cammie Sickle, MD 01/02/2019  7:56 PM

## 2019-01-02 NOTE — Assessment & Plan Note (Addendum)
#  68 year old male patient with history of CMML under surveillance currently admitted to hospital for worsening shortness of breath/severe thrombocytopenia-worsening anemia/leukocytosis  #Severe anemia/thrombocytopenia worsening leukocytosis/context of diagnosis of CMML.  Patient currently on active surveillance.  Question patient candidate for palliative chemotherapy like hypo-methylating agents like Vidaza.  Defer to Dr. Grayland Ormond for further input.  #Advanced COPD/COPD exacerbation-baseline 5 L of oxygen.  Currently 10 L.  Agree with anti-biotcs/pulmonary recommendations.  #Unfortunately poor prognosis-CMML especially in the context of advanced COPD/respiratory failure.  Discussed with Dr. Anselm Jungling.

## 2019-01-02 NOTE — Progress Notes (Signed)
[facility] at [place] NAME: Tony Watson    MR#:  [id number]  DATE OF BIRTH:  [birth year]  SUBJECTIVE:  CHIEF COMPLAINT:   Chief Complaint  Patient presents with  . Shortness of Breath   Recurrent admission with COPD.  Chronic 5 L oxygen use at home.  Came with worsening respiratory status for last 3 to 4 days.  Noted to have some pneumonia.  Have drop in his blood counts also. Still requiring high oxygen, had some shortness of breath episodes today again.  REVIEW OF SYSTEMS:  CONSTITUTIONAL: No fever, fatigue or weakness.  EYES: No blurred or double vision.  EARS, NOSE, AND THROAT: No tinnitus or ear pain.  RESPIRATORY: have cough, shortness of breath, wheezing ,no hemoptysis.  CARDIOVASCULAR: No chest pain, orthopnea, edema.  GASTROINTESTINAL: No nausea, vomiting, diarrhea or abdominal pain.  GENITOURINARY: No dysuria, hematuria.  ENDOCRINE: No polyuria, nocturia,  HEMATOLOGY: No anemia, easy bruising or bleeding SKIN: No rash or lesion. MUSCULOSKELETAL: No joint pain or arthritis.   NEUROLOGIC: No tingling, numbness, weakness.  PSYCHIATRY: No anxiety or depression.   ROS  DRUG ALLERGIES:   Allergies  Allergen Reactions  . Prilosec [Omeprazole] Diarrhea    VITALS:  Blood pressure (!) 127/47, pulse 84, temperature (!) 97.4 F (36.3 C), temperature source Oral, resp. rate 19, height 5\' 11"  (1.803 m), weight 111.6 kg, SpO2 92 %.  PHYSICAL EXAMINATION:  GENERAL:  68 y.o.-year-old patient lying in the bed with no acute distress.  EYES: Pupils equal, round, reactive to light and accommodation. No scleral icterus. Extraocular muscles intact.  HEENT: Head atraumatic, normocephalic. Oropharynx and nasopharynx Watson.  NECK:  Supple, no jugular venous distention. No thyroid enlargement, no tenderness.  LUNGS: Normal breath sounds bilaterally, b/l wheezing, have some crepitation. No use of accessory muscles of respiration.   CARDIOVASCULAR: S1, S2 normal. No murmurs, rubs, or gallops.  ABDOMEN: Soft, nontender, nondistended. Bowel sounds present. No organomegaly or mass.  EXTREMITIES: No pedal edema, cyanosis, or clubbing.  NEUROLOGIC: Cranial nerves II through XII are intact. Muscle strength 4/5 in all extremities. Sensation intact. Gait not checked.  PSYCHIATRIC: The patient is alert and oriented x 3.  SKIN: No obvious rash, lesion, or ulcer.   Physical Exam LABORATORY PANEL:   CBC Recent Labs  Lab 01/02/19 0043  WBC 66.3*  HGB 8.1*  HCT 24.6*  PLT 16*   ------------------------------------------------------------------------------------------------------------------  Chemistries  Recent Labs  Lab 01/06/2019 1308 01/01/19 0515  NA 135 134*  K 3.1* 4.1  CL 92* 94*  CO2 33* 27  GLUCOSE 79 258*  BUN 33* 44*  CREATININE 1.05 1.17  CALCIUM 8.6* 8.2*  AST 41  --   ALT 34  --   ALKPHOS 80  --   BILITOT 1.6*  --    ------------------------------------------------------------------------------------------------------------------  Cardiac Enzymes No results for input(s): TROPONINI in the last 168 hours. ------------------------------------------------------------------------------------------------------------------  RADIOLOGY:  No results found.  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  1.  Acute on chronic respiratory failure with hypoxia- secondary to pneumonia with underlying COPD/interstitial lung disease exacerbation. - Patient's O2 requirements at home had increased from 5 to 8 L and  he was noted to be  significantly hypoxemic in the ER. - Continue O2 supplementation,  treat underlying pneumonia and COPD with broad-spectrum IV antibiotics with vancomycin, cefepime.  Give the patient IV steroids, scheduled duo nebs, Pulmicort nebs for his COPD. - Wean off oxygen as tolerated. -Due to recurrent admissions and terminal COPD I have called pulmonary consult. -They have suggested to give chest physiotherapy and involve palliative care.  2.  COPD/interstitial lung disease exacerbation - secondary to pneumonia. - We will treat patient with IV steroids, scheduled duo nebs, Pulmicort nebs.  Continue empiric antibiotics with vancomycin, cefepime.  Follow cultures.  3.  Pneumonia-source of patient's worsening respiratory failure and COPD exacerbation. -Continue IV vancomycin, cefepime.  Follow cultures.  Patient's COVID-19 test was negative.  4.  Leukocytosis-secondary to his underlying CML it is somewhat worse than his baseline. -Follow white cell count with IV antibiotic therapy.  5.  Thrombocytopenia-secondary to underlying CML. - Hold off heparin products, follow platelet count.   -I have called oncology consult as patient's platelet count where > 50- 10 days ago and there is significant drop since then. -Given blood transfusion as advised by oncologist, platelet count is stable.  6.  Diabetes type 2 with neuropathy- continue patient's Lantus, sliding scale insulin. -Follow blood sugars.  7.  History of CML-patient follows with Dr. Grayland Ormond. -Continue Corlanor.  -Called oncology consult. Given PRBC transfusion due to low hemoglobin. I will call palliative care to discuss goals of care.  8.  Diabetic neuropathy-continue gabapentin.  9.  Essential hypertension-continue carvedilol, losartan, Aldactone.  10.  Hyperlipidemia-continue atorvastatin.  11.  GERD-continue Protonix.  Patient's prognosis is guarded to poor given his recurrent hospitalizations and multiple comorbidities.   Patient is a DNR.    Will call palliative care consult for goals of care.   All the records are reviewed and case discussed with Care Management/Social Workerr. Management plans discussed with the patient, family and they are in agreement.  CODE STATUS: DNR  TOTAL TIME TAKING CARE OF THIS PATIENT: 35 minutes.     POSSIBLE D/C IN 1-2 DAYS, DEPENDING ON CLINICAL CONDITION.   Vaughan Basta M.D on 01/02/2019   Between 7am to 6pm - Pager - [phone]  After 6pm go to [email or web address] - password [email or web address] [facility]  Office  [phone]  CC: Primary care physician; Owens Loffler, MD  Note: This dictation was prepared with Dragon dictation along with smaller phrase technology. Any transcriptional errors that result from this process are unintentional.

## 2019-01-02 NOTE — Plan of Care (Signed)
Pt has been very SOB today, and anxious.  Explained that part of that may be the steroids he's on.  We did get the ativan increased from 0.5 to 1 mg tid.  We also got chest vest physiotherapy added.  Pulmonology has been consulted but hasn't seen him yet.  Have tried to page 2x.  Palliative consult has been added for goals of care.  He's currently on HFNC 10L. Platelets have slightly improved to 16. Pt hasn't been able to lie down all day - can only tolerate sitting on the edge of the bed.

## 2019-01-03 ENCOUNTER — Inpatient Hospital Stay: Payer: Medicare HMO

## 2019-01-03 DIAGNOSIS — Z888 Allergy status to other drugs, medicaments and biological substances status: Secondary | ICD-10-CM

## 2019-01-03 DIAGNOSIS — F411 Generalized anxiety disorder: Secondary | ICD-10-CM

## 2019-01-03 DIAGNOSIS — J189 Pneumonia, unspecified organism: Secondary | ICD-10-CM

## 2019-01-03 DIAGNOSIS — Z7189 Other specified counseling: Secondary | ICD-10-CM

## 2019-01-03 DIAGNOSIS — R0602 Shortness of breath: Secondary | ICD-10-CM

## 2019-01-03 DIAGNOSIS — Z9981 Dependence on supplemental oxygen: Secondary | ICD-10-CM

## 2019-01-03 DIAGNOSIS — Z515 Encounter for palliative care: Secondary | ICD-10-CM

## 2019-01-03 DIAGNOSIS — I340 Nonrheumatic mitral (valve) insufficiency: Secondary | ICD-10-CM

## 2019-01-03 DIAGNOSIS — I5043 Acute on chronic combined systolic (congestive) and diastolic (congestive) heart failure: Secondary | ICD-10-CM

## 2019-01-03 DIAGNOSIS — Z951 Presence of aortocoronary bypass graft: Secondary | ICD-10-CM

## 2019-01-03 DIAGNOSIS — I110 Hypertensive heart disease with heart failure: Secondary | ICD-10-CM

## 2019-01-03 DIAGNOSIS — Z803 Family history of malignant neoplasm of breast: Secondary | ICD-10-CM

## 2019-01-03 DIAGNOSIS — E1143 Type 2 diabetes mellitus with diabetic autonomic (poly)neuropathy: Secondary | ICD-10-CM

## 2019-01-03 DIAGNOSIS — Z955 Presence of coronary angioplasty implant and graft: Secondary | ICD-10-CM

## 2019-01-03 DIAGNOSIS — I5042 Chronic combined systolic (congestive) and diastolic (congestive) heart failure: Secondary | ICD-10-CM

## 2019-01-03 DIAGNOSIS — I2510 Atherosclerotic heart disease of native coronary artery without angina pectoris: Secondary | ICD-10-CM

## 2019-01-03 DIAGNOSIS — Z87891 Personal history of nicotine dependence: Secondary | ICD-10-CM

## 2019-01-03 DIAGNOSIS — J849 Interstitial pulmonary disease, unspecified: Secondary | ICD-10-CM

## 2019-01-03 LAB — CBC
HCT: 25.6 % — ABNORMAL LOW (ref 39.0–52.0)
Hemoglobin: 8.3 g/dL — ABNORMAL LOW (ref 13.0–17.0)
MCH: 34.2 pg — ABNORMAL HIGH (ref 26.0–34.0)
MCHC: 32.4 g/dL (ref 30.0–36.0)
MCV: 105.3 fL — ABNORMAL HIGH (ref 80.0–100.0)
Platelets: 11 10*3/uL — CL (ref 150–400)
RBC: 2.43 MIL/uL — ABNORMAL LOW (ref 4.22–5.81)
RDW: 20.2 % — ABNORMAL HIGH (ref 11.5–15.5)
WBC: 59.6 10*3/uL (ref 4.0–10.5)
nRBC: 0.4 % — ABNORMAL HIGH (ref 0.0–0.2)

## 2019-01-03 LAB — BASIC METABOLIC PANEL
Anion gap: 14 (ref 5–15)
BUN: 73 mg/dL — ABNORMAL HIGH (ref 8–23)
CO2: 21 mmol/L — ABNORMAL LOW (ref 22–32)
Calcium: 8.3 mg/dL — ABNORMAL LOW (ref 8.9–10.3)
Chloride: 95 mmol/L — ABNORMAL LOW (ref 98–111)
Creatinine, Ser: 1.27 mg/dL — ABNORMAL HIGH (ref 0.61–1.24)
GFR calc Af Amer: >60 mL/min (ref >60)
GFR calc non Af Amer: 58 mL/min — ABNORMAL LOW (ref >60)
Glucose, Bld: 369 mg/dL — ABNORMAL HIGH (ref 70–99)
Potassium: 4.3 mmol/L (ref 3.5–5.1)
Sodium: 130 mmol/L — ABNORMAL LOW (ref 135–145)

## 2019-01-03 LAB — GLUCOSE, CAPILLARY
Glucose-Capillary: 323 mg/dL — ABNORMAL HIGH (ref 70–99)
Glucose-Capillary: 357 mg/dL — ABNORMAL HIGH (ref 70–99)
Glucose-Capillary: 346 mg/dL — ABNORMAL HIGH (ref 70–99)
Glucose-Capillary: 327 mg/dL — ABNORMAL HIGH (ref 70–99)

## 2019-01-03 MED ORDER — MORPHINE SULFATE (CONCENTRATE) 10 MG/0.5ML PO SOLN
5.0000 mg | ORAL | Status: DC | PRN
  Administered 2019-01-03 – 2019-01-04 (×3): 5 mg via ORAL
  Filled 2019-01-03 (×3): qty 1

## 2019-01-03 MED ORDER — INSULIN ASPART 100 UNIT/ML ~~LOC~~ SOLN
4.0000 [IU] | Freq: Three times a day (TID) | SUBCUTANEOUS | Status: DC
  Administered 2019-01-03: 18:00:00 4 [IU] via SUBCUTANEOUS
  Filled 2019-01-03: qty 1

## 2019-01-03 MED ORDER — INSULIN GLARGINE 100 UNIT/ML ~~LOC~~ SOLN
15.0000 [IU] | Freq: Every day | SUBCUTANEOUS | Status: DC
  Administered 2019-01-03 – 2019-01-04 (×2): 15 [IU] via SUBCUTANEOUS
  Filled 2019-01-03 (×2): qty 0.15

## 2019-01-03 NOTE — Progress Notes (Signed)
Inpatient Diabetes Program Recommendations  AACE/ADA: New Consensus Statement on Inpatient Glycemic Control (2015)  Target Ranges:  Prepandial:   less than 140 mg/dL      Peak postprandial:   less than 180 mg/dL (1-2 hours)      Critically ill patients:  140 - 180 mg/dL   Lab Results  Component Value Date   GLUCAP 323 (H) 01/03/2019   HGBA1C 8.6 (H) 11/25/2018    Review of Glycemic Control Results for AD, GUTTMAN (MRN [id number]) as of 01/03/2019 09:55  Ref. Range 01/02/2019 07:45 01/02/2019 11:40 01/02/2019 17:09 01/02/2019 20:44 01/03/2019 07:49  Glucose-Capillary Latest Ref Range: 70 - 99 mg/dL 341 (H) 308 (H) 279 (H) 334 (H) 323 (H)   Diabetes history: DM 2 Outpatient Diabetes medications:  Lantus 25 units daily, Novolog 8 units tid with meals Current orders for Inpatient glycemic control:  Lantus 15 units daily (just added), Novolog sensitive tid with meals and HS Solumedrol 40 mg IV q 6 hours  Inpatient Diabetes Program Recommendations:    Please consider adding Novolog meal coverage 4 units tid with meals as well.   Thanks  Adah Perl, RN, BC-ADM Inpatient Diabetes Coordinator Pager [phone] (8a-5p)

## 2019-01-03 NOTE — Progress Notes (Signed)
[facility] at [place] NAME: Tony Watson    MR#:  [id number]  DATE OF BIRTH:  1950-12-10  SUBJECTIVE:  CHIEF COMPLAINT:   Chief Complaint  Patient presents with  . Shortness of Breath   Recurrent admission with COPD.  Chronic 5 L oxygen use at home.  Came with worsening respiratory status for last 3 to 4 days.  Noted to have some pneumonia.  Have drop in his blood counts also. Still requiring high oxygen, had some shortness of breath episodes today again.  REVIEW OF SYSTEMS:  CONSTITUTIONAL: No fever, fatigue or weakness.  EYES: No blurred or double vision.  EARS, NOSE, AND THROAT: No tinnitus or ear pain.  RESPIRATORY: have cough, shortness of breath, wheezing ,no hemoptysis.  CARDIOVASCULAR: No chest pain, orthopnea, edema.  GASTROINTESTINAL: No nausea, vomiting, diarrhea or abdominal pain.  GENITOURINARY: No dysuria, hematuria.  ENDOCRINE: No polyuria, nocturia,  HEMATOLOGY: No anemia, easy bruising or bleeding SKIN: No rash or lesion. MUSCULOSKELETAL: No joint pain or arthritis.   NEUROLOGIC: No tingling, numbness, weakness.  PSYCHIATRY: No anxiety or depression.   ROS  DRUG ALLERGIES:   Allergies  Allergen Reactions  . Prilosec [Omeprazole] Diarrhea    VITALS:  Blood pressure (!) 117/44, pulse 79, temperature (!) 97.3 F (36.3 C), temperature source Oral, resp. rate 20, height 5\' 11"  (1.803 m), weight 111.6 kg, SpO2 91 %.  PHYSICAL EXAMINATION:  GENERAL:  68 y.o.-year-old patient lying in the bed with no acute distress.  EYES: Pupils equal, round, reactive to light and accommodation. No scleral icterus. Extraocular muscles intact.  HEENT: Head atraumatic, normocephalic. Oropharynx and nasopharynx clear.  NECK:  Supple, no jugular venous distention. No thyroid enlargement, no tenderness.  LUNGS: Normal breath sounds bilaterally, b/l wheezing, have some crepitation. No use of accessory muscles of respiration.   CARDIOVASCULAR: S1, S2 normal. No murmurs, rubs, or gallops.  ABDOMEN: Soft, nontender, nondistended. Bowel sounds present. No organomegaly or mass.  EXTREMITIES: No pedal edema, cyanosis, or clubbing.  NEUROLOGIC: Cranial nerves II through XII are intact. Muscle strength 4/5 in all extremities. Sensation intact. Gait not checked.  PSYCHIATRIC: The patient is alert and oriented x 3.  SKIN: No obvious rash, lesion, or ulcer.   Physical Exam LABORATORY PANEL:   CBC Recent Labs  Lab 01/03/19 0559  WBC 59.6*  HGB 8.3*  HCT 25.6*  PLT 11*   ------------------------------------------------------------------------------------------------------------------  Chemistries  Recent Labs  Lab 01/08/2019 1308  01/03/19 0559  NA 135   < > 130*  K 3.1*   < > 4.3  CL 92*   < > 95*  CO2 33*   < > 21*  GLUCOSE 79   < > 369*  BUN 33*   < > 73*  CREATININE 1.05   < > 1.27*  CALCIUM 8.6*   < > 8.3*  AST 41  --   --   ALT 34  --   --   ALKPHOS 80  --   --   BILITOT 1.6*  --   --    < > = values in this interval not displayed.   ------------------------------------------------------------------------------------------------------------------  Cardiac Enzymes No results for input(s): TROPONINI in the last 168 hours. ------------------------------------------------------------------------------------------------------------------  RADIOLOGY:  Dg Chest 2 View  Result Date: 01/03/2019 CLINICAL DATA:  68 year old male with a history of respiratory failure EXAM: CHEST - 2 VIEW COMPARISON:  11/23/2018, 12/17/2018, CT 12/13/2018 FINDINGS: Cardiomediastinal silhouette unchanged in size and contour. Surgical changes of median  sternotomy. Redemonstration of diffuse reticulonodular pattern of opacity with no pleural effusion, interlobular septal thickening, pneumothorax. Patchy opacities in the bilateral lung bases similar to prior plain film and CT. No displaced fracture.  Degenerative changes of the  spine IMPRESSION: Similar appearance of the chest x-ray with chronic reticulonodular opacities and minimal airspace opacities persisting in the lower lung bases. No new/worsening airspace disease Electronically Signed   By: Corrie Mckusick D.O.   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  1.  Acute on chronic respiratory failure with hypoxia- secondary to pneumonia with underlying COPD/interstitial lung disease exacerbation. - Patient's O2 requirements at home had increased from 5 to 8 L and he was noted to be significantly hypoxemic in the ER. - Continue O2 supplementation,  treat underlying pneumonia and COPD with broad-spectrum IV antibiotics with vancomycin, cefepime.  Give the patient IV steroids, scheduled duo nebs, Pulmicort nebs for his COPD. - Wean off oxygen as tolerated. -Due to recurrent admissions and terminal COPD I have called pulmonary consult. -They have suggested to give chest physiotherapy and involve palliative care.  Patient states chest physical therapy is helping some.  2.  COPD/interstitial lung disease exacerbation - secondary to pneumonia. - We will treat patient with IV steroids, scheduled duo nebs, Pulmicort nebs.  Continue empiric antibiotics with vancomycin, cefepime.  Follow cultures.  3.  Pneumonia-source of patient's worsening respiratory failure and COPD exacerbation. -Continue IV vancomycin, cefepime.  Follow cultures.  Patient's COVID-19 test was negative.  4.  Leukocytosis-secondary to his underlying CML it is somewhat worse than his baseline. -Follow  white cell count with IV antibiotic therapy.  5.  Thrombocytopenia-secondary to underlying CML. - Hold off heparin products, follow platelet count.   -I have called oncology consult as patient's platelet count where > 50- 10 days ago and there is significant drop since then. -Given blood transfusion as advised by oncologist, platelet count is around 11,000 and will monitor tomorrow.  6.  Diabetes type 2 with neuropathy- continue patient's Lantus, sliding scale insulin. -Follow blood sugars.  7.  History of CML-patient follows with Dr. Grayland Ormond. -Continue Corlanor.  -Called oncology consult. Given PRBC transfusion due to low hemoglobin. I will call palliative care to discuss goals of care.  8.  Diabetic neuropathy-continue gabapentin.  9.  Essential hypertension-continue carvedilol, losartan, Aldactone.  10.  Hyperlipidemia-continue atorvastatin.  11.  GERD-continue Protonix.  Patient's prognosis is guarded to poor given his recurrent hospitalizations and multiple comorbidities.  Patient is a DNR.  palliative care consult for goals of care.   All the records are reviewed and case discussed with Care Management/Social Workerr. Management plans discussed with the patient, family and they are in agreement.  CODE STATUS: DNR  TOTAL TIME TAKING CARE OF THIS PATIENT: 35 minutes.    POSSIBLE D/C IN 1-2 DAYS, DEPENDING ON CLINICAL CONDITION.   Vaughan Basta M.D on 01/03/2019   Between 7am to 6pm - Pager - [phone]  After 6pm go to [email or web address] - password [email or web address] [facility]  Office  [phone]  CC: Primary care physician; Owens Loffler, MD  Note: This dictation was prepared with Dragon dictation along with  smaller phrase technology. Any transcriptional errors that result from this process are unintentional.

## 2019-01-03 NOTE — Care Management Important Message (Signed)
Important Message  Patient Details  Name: Tony Watson MRN: [id number] Date of Birth: 1951/07/07   Medicare Important Message Given:  Yes    Juliann Pulse A Dorleen Kissel 01/03/2019, 10:34 AM

## 2019-01-03 NOTE — Consult Note (Signed)
Consultation Note Date: 01/03/2019   Patient Name: Tony Watson  DOB: [birth year]  MRN: [id number]  Age / Sex: 68 y.o., male  PCP: Owens Loffler, MD Referring Physician: Vaughan Basta, *  Reason for Consultation: Establishing goals of care  HPI/Patient Profile: 68 y.o. male  with past medical history of chronic respiratory failure on 5L [place], COPD, ILD, CML followed by Dr. Grayland Ormond currently on active surveillance, combined congestive heart failure, ischemic cardiomyopathy EF 30-35%, three vessel CAD, HLD, HTN, DM,  admitted on 12/17/2018 with shortness of breath. Recent hospitalization due to sepsis/respiratory failure from pneumonia. Sent home with [facility]. This admission, recurrent acute on chronic respiratory failure from pneumonia and underlying COPD/interstitial lung exacerbation. Pulmonology consulted and agree with treatment plan with addition of chest PT and palliative consult. Patient with hx of CML followed by Dr. Grayland Ormond. Oncology consulted inpatient due to thrombocytopenia. Palliative medicine consultation for goals of care.  Clinical Assessment and Goals of Care:  I have reviewed medical records, discussed with care team and met with patient at bedside to discuss diagnosis, prognosis, GOC, EOL wishes, disposition and options.   Introduced Palliative Medicine as specialized medical care for people living with serious illness. It focuses on providing relief from the symptoms and stress of a serious illness. The goal is to improve quality of life for both the patient and the family. Patient known to PMT from previous admissions.   Initially explored patients symptoms. He is visibly short of breath sitting up on the side of bed. Remains on 10L HFNC. He tells me he feels "worse" since admission. States relief of anxiety from prn ativan.   Explored patient's knowledge of diagnoses,  interventions, and prognosis. Tony Watson requests I speak with his daughter Tony Watson) and wife Tony Watson) regarding plan of care. He states "goes over my head."   Patient was agreeable for PMT provider to update him on course of hospitalization including diagnoses and interventions. Discussed recommendations from oncology and pulmonology. Expressed concern with recurrent hospitalization indicating disease progression and poor prognosis. Discussed poor responsive from continued medical interventions and nearing a point where symptom management medications are necessary to control symptoms.   Introduced and educated on hospice philosophy and options. Patient speaks of the desire to return home on discharge and shares that he has a supportive family and handicap accessible house. Patient is agreeable for hospice services if his family is ok with this decision. (Defers decision to family).   Discussed addition of low dose Roxanol not only for pain but dyspnea/air hunger. Patient agreeable to try Roxanol this afternoon. Notified RN.   (ADDENDUM 15:45: Patient states relief from prn Roxanol. Explained that this has been added to [facility] q4h as needed).   Answered questions and concerns. Emotional support provided.    **Shortly after, spoke with daughter Tony Watson) and wife Tony Watson) via telephone.   Introduced Palliative Medicine as specialized medical care for people living with serious illness. It focuses on providing relief from the symptoms and stress of a serious illness. The goal is to improve  quality of life for both the patient and the family. Patient known to PMT from previous admissions.   Discussed a brief life review of patient. Married to [place] for 41 years. Tony Watson is only child who recently moved in with them in September. Tony Watson shares her father's hardworking and tough personality. Worked 2-3 jobs up until he was forced to retire at [employer] due to worsening health conditions. She shares that he often  waits "until it is too late" in regards to respiratory distress or pain.   Discussed course of hospitalization including diagnoses, interventions, and plan of care. Discussed recommendations from care team and concern of recurrent hospitalization indicating disease progression. Discussed poor candidacy for further oncology interventions due to underlying tenuous respiratory status and heart/lung conditions. Frankly and compassionately discussed poor prognosis, likely <6 months if not days-weeks.   Tony Watson acknowledges her and her mother's understanding that he is "dying." They speak of importance of bring him home and that they recently moved into a previous quadraplegics house that is very handicap accessible to her father.   The natural disease trajectory and expectations at EOL were discussed. Hard Choices and Gone from [facility] booklets left at bedside.   The difference between aggressive medical intervention and comfort care was considered in light of the patient's goals of care. Daughter confirms decision for DNR/DNI code status.  Introduced hospice philosophy and options, explaining role of comfort, quality, dignity at EOL. Educated on symptom management medications to relieve suffering and prevent recurrent hospitalization. Discussed plan to continue prn ativan and initiate prn Roxanol and re-assess in AM. Discussed concern that he may have high symptom burden and require more medications for comfort with underlying heart and lung conditions.   After discussion, daughter and wife confirm decision to take him home with hospice services. They are hopeful for even tomorrow. Discussed re-evaluation of symptoms in AM.   Questions and concerns were addressed. PMT contact information given.    SUMMARY OF RECOMMENDATIONS    DNR/DNI  Continue current plan of care and medical optimization inpatient.  [place] discussion with patient and family. Family wishes to take him home with support of hospice  services. RN CM notified.   Symptom management--see below  Approve 1x bedside visit for wife. Discussed and approved by Dr. Anselm Jungling and charge RN.   PMT will follow inpatient.   Code Status/Advance Care Planning:  DNR  Symptom Management:   Continue Ativan 50m PO TID prn anxiety  Initiate Roxanol 563mSL q4h prn moderate pain/dyspnea/tachypnea/air hunger  Palliative Prophylaxis:   Aspiration, Frequent Pain Assessment, Oral Care and Turn Reposition  Psycho-social/Spiritual:   Desire for further Chaplaincy support:yes  Additional Recommendations: Caregiving  Support/Resources, Compassionate Wean Education and Education on Hospice  Prognosis:   Poor prognosis possibly weeks with chronic respiratory failure, pneumonia, COPD/ILD, combined CHF with EF 30-35%, and undelrying CML with worsening thrombocytopenia  Discharge Planning: Home with Hospice      Primary Diagnoses: Present on Admission: . Acute on chronic respiratory failure ([place]. 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 The following aspects are pertinent.  Past Medical History:  Diagnosis Date  . Arthritis   . CAD (coronary artery disease)    [place] [facility] [id number]%, patent RCA stent, no obs dz; c. MV 2008: EF 42%, inf infarct, no ischemia; d. R/[facility]/19:  3-v dz w/ mild dif dz LM, m-dLAD 50, dLAD 60, D2 80, RI 80, mRCA-1 90, mRCA-2 20, patent RCA stent, CO/CI 3.7/2  . Chronic combined systolic (congestive) and diastolic (congestive) heart failure ([place])    a. 7/29019 Echo: EF 25-30%, sev diff HK, mild MR mod dil LA, mildly reduced RVSF, elevated CVP  . Chronic respiratory failure with hypoxia (HCC)    a. on 2L supplemental oxygen via [facility] followed by pulmonology  . CML (chronic myelocytic leukemia) ([place])   . COPD (chronic obstructive pulmonary  disease) ([place])   . Diabetes mellitus without complication ([place])   . Diabetic autonomic neuropathy associated with type 2 diabetes mellitus ([place]) 03/27/2015  . Diverticulosis 2002  . GERD (gastroesophageal reflux disease)   . HTN (hypertension)   . Hyperlipidemia   . ILD (interstitial lung disease) ([place])   . Ischemic cardiomyopathy   . Medical non-compliance   . Smoker   . Spinal stenosis   . TIA (transient ischemic attack)    a. s/p PFO closure 2004 (in [place])  . Ulcer    Social History   Socioeconomic History  . Marital status: Married    Spouse name: Not on file  . Number of children: Not on file  . Years of education: Not on file  . Highest education level: Not on file  Occupational History  . Not on file  Social Needs  . Financial resource strain: Not on file  . Food insecurity:    Worry: Not on file    Inability: Not on file  . Transportation needs:    Medical: Not on file    Non-medical: Not on file  Tobacco Use  . Smoking status: Former Smoker    Packs/day: 0.50    Years: 45.00    Pack years: 22.50    Types: Cigarettes    Last attempt to quit: 01/25/2018    Years since quitting: 0.9  . Smokeless tobacco: Never Used  . Tobacco comment: 1 ppd +40 years  Substance and Sexual Activity  . Alcohol use: Not Currently    Alcohol/week: 0.0 standard drinks    Comment: weekly but last dose 1 month  . Drug use: No  . Sexual activity: Not on file  Lifestyle  . Physical activity:    Days per week: Not on file    Minutes per session: Not on file  . Stress: Not on file  Relationships  . Social connections:    Talks on phone: Not on file    Gets together: Not on file    Attends religious service: Not on file    Active member of club or organization: Not on file    Attends meetings of clubs or organizations: Not on file    Relationship status: Not on file  Other Topics Concern  . Not on file  Social History Narrative   Married, 1 daughter.  Lives in [place], [place]    Family History  Problem Relation Age of Onset  . Coronary artery disease Other        family hx  . Breast cancer Other        1st egree relative <50  . Cancer Mother   . Heart disease Father    Scheduled Meds: . aspirin  81 mg Oral Daily  . atorvastatin  40 mg Oral q1800  . budesonide (PULMICORT) nebulizer solution  0.5 mg Nebulization BID  . carvedilol  12.5 mg Oral BID  . cholecalciferol  2,000 Units Oral QPM  .  fluticasone  2 spray Each Nare Daily  . gabapentin  300 mg Oral QHS  . insulin aspart  0-5 Units Subcutaneous QHS  . insulin aspart  0-9 Units Subcutaneous TID WC  . insulin aspart  4 Units Subcutaneous TID WC  . insulin glargine  15 Units Subcutaneous Daily  . ipratropium-albuterol  3 mL Nebulization Q4H  . ivabradine  5 mg Oral BID WC  . loratadine  10 mg Oral Daily  . losartan  12.5 mg Oral Daily  . mouth rinse  15 mL Mouth Rinse BID  . methylPREDNISolone (SOLU-MEDROL) injection  40 mg Intravenous Q6H  . nystatin  5 mL Oral QID  . pantoprazole  40 mg Oral Daily  . spironolactone  25 mg Oral Daily  . tiZANidine  4 mg Oral Nightly  . torsemide  40 mg Oral Daily  . vitamin C  500 mg Oral Daily   Continuous Infusions: . sodium chloride 20 mL (01/03/19 0522)  . ceFEPime (MAXIPIME) IV 2 g (01/03/19 1437)   PRN Meds:.sodium chloride, acetaminophen **OR** acetaminophen, benzonatate, ipratropium-albuterol, LORazepam, morphine CONCENTRATE, ondansetron **OR** ondansetron (ZOFRAN) IV Medications Prior to Admission:  Prior to Admission medications   Medication Sig Start Date End Date Taking? Authorizing Provider  acetaminophen (TYLENOL) 500 MG tablet Take 1,000 mg by mouth daily as needed for moderate pain or headache.   Yes [provider]  aspirin 81 MG chewable tablet Chew 1 tablet (81 mg total) by mouth daily. 05/09/18  Yes Wieting, Richard, MD  atorvastatin (LIPITOR) 40 MG tablet TAKE 1 TABLET (40 MG TOTAL) BY MOUTH DAILY AT 6 PM. 12/01/18  Yes Gollan,  Kathlene November, MD  benzonatate (TESSALON) 200 MG capsule Take 1 capsule (200 mg total) by mouth 3 (three) times daily as needed for cough. 12/26/18 12/26/19 Yes Copland, Frederico Hamman, MD  blood glucose meter kit and supplies Dispense based on patient and insurance preference. Use to check blood sugar daily. 11/16/18  Yes Copland, Frederico Hamman, MD  budesonide (PULMICORT) 0.5 MG/2ML nebulizer solution Take 2 mLs (0.5 mg total) by nebulization 2 (two) times daily. 03/06/18 03/06/19 Yes Vaughan Basta, MD  carvedilol (COREG) 12.5 MG tablet Take 1 tablet (12.5 mg total) by mouth 2 (two) times daily. 11/14/18 02/12/19 Yes Gollan, Kathlene November, MD  cetirizine (ZYRTEC) 10 MG chewable tablet Chew 10 mg by mouth daily.   Yes [provider]  Cholecalciferol (VITAMIN D) 2000 units CAPS Take 2,000 Units by mouth every evening.   Yes [provider]  formoterol (PERFOROMIST) 20 MCG/2ML nebulizer solution Take 2 mLs (20 mcg total) by nebulization 2 (two) times daily. J44.9 06/10/18  Yes [name], MD  gabapentin (NEURONTIN) 300 MG capsule Take 1 capsule (300 mg total) by mouth at bedtime. 11/03/18  Yes Copland, Frederico Hamman, MD  guaiFENesin-codeine 100-10 MG/5ML syrup Take 5 mLs by mouth at bedtime as needed for cough. 12/26/18  Yes Copland, Frederico Hamman, MD  insulin aspart (NOVOLOG) 100 UNIT/ML injection 5 units subcutaneous prior to meals (okay to substitute generic or what is covered by [employer]) Patient taking differently: 8 units subcutaneous prior to meals (okay to substitute generic or what is covered by Insurance) 12/21/18  Yes Leslye Peer, Richard, MD  insulin glargine (LANTUS) 100 UNIT/ML injection Inject 0.2 mLs (20 Units total) into the skin daily. Patient taking differently: Inject 25 Units into the skin daily.  12/22/18  Yes Wieting, Richard, MD  ipratropium-albuterol (DUONEB) 0.5-2.5 (3) MG/3ML SOLN Take 3 mLs by nebulization every 4 (four) hours as needed. DX:J44.9 Patient  taking differently: Take 3 mLs by  nebulization every 4 (four) hours as needed (for shortness of breath/wheezing).  11/04/18  Yes [name], MD  ivabradine (CORLANOR) 5 MG TABS tablet Take 1 tablet (5 mg total) by mouth 2 (two) times daily with a meal. 08/08/18  Yes Hackney, Tina A, FNP  LORazepam (ATIVAN) 0.5 MG tablet Take 1 tablet (0.5 mg total) by mouth 3 (three) times daily as needed for anxiety. 11/11/18  Yes Copland, Frederico Hamman, MD  losartan (COZAAR) 25 MG tablet Take 0.5 tablets (12.5 mg total) by mouth daily. 11/14/18  Yes Minna Merritts, MD  Multiple Vitamin (MULTIVITAMIN WITH MINERALS) TABS tablet Take 1 tablet by mouth daily. 03/07/18  Yes Vaughan Basta, MD  nystatin (MYCOSTATIN) 100000 UNIT/ML suspension TAKE AS DIRECTED 5 MLS (500,000 UNITS TOTAL) IN THE MOUTH OR THROAT 4 TIMES DAILY Patient taking differently: Use as directed 5 mLs in the mouth or throat 4 (four) times daily.  10/12/18  Yes Copland, Frederico Hamman, MD  ondansetron (ZOFRAN ODT) 4 MG disintegrating tablet Take 1-2 tablets (4-8 mg total) by mouth every 6 (six) hours as needed for nausea or vomiting. 11/16/18  Yes Copland, Frederico Hamman, MD  spironolactone (ALDACTONE) 25 MG tablet TAKE 1 TABLET BY MOUTH EVERY DAY 12/01/18  Yes Gollan, Kathlene November, MD  tiZANidine (ZANAFLEX) 4 MG tablet Take 1 tablet (4 mg total) by mouth Nightly. 03/23/18  Yes Copland, Frederico Hamman, MD  torsemide (DEMADEX) 20 MG tablet Take 2 tablets (40 mg total) by mouth daily. May also take 1 tablet (20 mg total) as needed (AS NEEDED FOR SWELLING). 12/29/18 03/29/19 Yes Gollan, Kathlene November, MD  vitamin C (ASCORBIC ACID) 500 MG tablet Take 500 mg by mouth daily.   Yes [provider]   Allergies  Allergen Reactions  . Prilosec [Omeprazole] Diarrhea   Review of Systems  Constitutional: Positive for activity change and appetite change.  Respiratory: Positive for shortness of breath.   Neurological: Positive for weakness.    Physical Exam Vitals signs and nursing note reviewed.  Constitutional:       General: He is awake.     Appearance: He is cachectic. He is ill-appearing.  Cardiovascular:     Rate and Rhythm: Normal rate.  Pulmonary:     Effort: Tachypnea present.     Breath sounds: Wheezing and rhonchi present.     Comments: Dyspnea at rest. RN to give prn Roxanol Skin:    General: Skin is warm and dry.     Coloration: Skin is pale.  Neurological:     Mental Status: He is alert and oriented to person, place, and time.     Comments: Defers GOC decisions to family    Vital Signs: BP (!) 117/44 (BP Location: Right Arm)   Pulse 79   Temp (!) 97.3 F (36.3 C) (Oral)   Resp 20   Ht _0  (1.803 m)   Wt 111.6 kg   SpO2 91%   BMI 34.31 kg/m  Pain Scale: 0-10   Pain Score: 0-No pain   SpO2: SpO2: 91 % O2 Device:SpO2: 91 % O2 Flow Rate: .O2 Flow Rate (L/min): 10 L/min  IO: Intake/output summary:   Intake/Output Summary (Last 24 hours) at 01/03/2019 1701 Last data filed at 01/03/2019 1008 Gross per 24 hour  Intake 2728.42 ml  Output 200 ml  Net 2528.42 ml    LBM: Last BM Date: 01/03/19 Baseline Weight: Weight: 111.6 kg Most recent weight: Weight: 111.6 kg     Palliative Assessment/Data:  PPS 40%   Flowsheet Rows     Most Recent Value  Intake Tab  Referral Department  Hospitalist  Unit at Time of Referral  Med/Surg Unit  Palliative Care Primary Diagnosis  Pulmonary  Palliative Care Type  Return patient Palliative Care  Reason for referral  Clarify Goals of Care  Date first seen by Palliative Care  01/03/19  Clinical Assessment  Palliative Performance Scale Score  40%  Psychosocial & Spiritual Assessment  Palliative Care Outcomes  Patient/Family meeting held?  Yes  Who was at the meeting?  patient, daughter, wife  Palliative Care Outcomes  Clarified goals of care, Provided end of life care assistance, Provided psychosocial or spiritual support, ACP counseling assistance, Improved pain interventions, Improved non-pain symptom therapy, Counseled regarding  hospice, Transitioned to hospice      Time In/Out: 1330-1400, 1510-1610 Time Total: 10mn Greater than 50%  of this time was spent counseling and coordinating care related to the above assessment and plan.  Signed by:  MIhor Dow FNP-C Palliative Medicine Team  Phone: [phone]: [phone]  Please contact Palliative Medicine Team phone at [phone] questions and concerns.  For individual provider: See AShea Evans

## 2019-01-03 NOTE — Progress Notes (Signed)
[place]  Telephone:(336) [phone] [phone]) [phone]  ID: Tony Watson OB: [birth year]  MR#: [id number]  IOX#:[id number]  Patient Care Team: Owens Loffler, MD as PCP - General Wellington Hampshire, MD as PCP - Cardiology (Cardiology) Leonel Ramsay, MD (Infectious Diseases) [name], MD as Consulting Physician (Pulmonary Disease) Wellington Hampshire, MD as Consulting Physician (Cardiology)  CHIEF COMPLAINT: Acute on chronic respiratory failure, CMML, worsening anemia and thrombocytopenia.  INTERVAL HISTORY: Patient evaluated by on-call oncologist over the weekend.  Continues to have shortness of breath, cough, and poor appetite.  Has chronic weakness and fatigue.  Denies any fevers.  Patient states he feels slightly improved since admission.  REVIEW OF SYSTEMS:   Review of Systems  Constitutional: Positive for malaise/fatigue. Negative for fever and weight loss.  Respiratory: Positive for cough and shortness of breath. Negative for hemoptysis.   Cardiovascular: Negative.  Negative for chest pain.  Gastrointestinal: Negative.  Negative for abdominal pain.  Genitourinary: Negative.  Negative for dysuria.  Musculoskeletal: Negative.  Negative for back pain.  Skin: Negative.  Negative for rash.  Neurological: Negative.  Negative for dizziness, focal weakness, weakness and headaches.  Endo/Heme/Allergies: Bruises/bleeds easily.  Psychiatric/Behavioral: Negative.  The patient is not nervous/anxious.     As per HPI. Otherwise, a complete review of systems is negative.  PAST MEDICAL HISTORY: Past Medical History:  Diagnosis Date   Arthritis    CAD (coronary artery disease)    a. inf MI 11/99 (in [place]) w/ PCI to [facility]; b. LHC 2004 [facility]): EF 50%, patent RCA stent, no obs dz; c. MV 2008: EF 42%, inf infarct, no ischemia; d. R/LHC 9/19: 3-v dz w/ mild dif dz LM, m-dLAD 50, dLAD 60, D2 80, RI 80, mRCA-1 90, mRCA-2 20, patent RCA stent, CO/CI 3.7/2    Chronic combined systolic (congestive) and diastolic (congestive) heart failure (HCC)    a. 7/29019 Echo: EF 25-30%, sev diff HK, mild MR mod dil LA, mildly reduced RVSF, elevated CVP   Chronic respiratory failure with hypoxia (HCC)    a. on 2L supplemental oxygen via [place] followed by pulmonology   CML (chronic myelocytic leukemia) (HCC)    COPD (chronic obstructive pulmonary disease) ([place])    Diabetes mellitus without complication ([place])    Diabetic autonomic neuropathy associated with type 2 diabetes mellitus ([place]) 03/27/2015   Diverticulosis 2002   GERD (gastroesophageal reflux disease)    HTN (hypertension)    Hyperlipidemia    ILD (interstitial lung disease) ([place])    Ischemic cardiomyopathy    Medical non-compliance    Smoker    Spinal stenosis    TIA (transient ischemic attack)    a. s/p PFO closure 2004 (in [place])   Ulcer     PAST SURGICAL HISTORY: Past Surgical History:  Procedure Laterality Date   BACK SURGERY     patient denies-just lumbar punctures   BRONCHOSCOPY     CARDIAC CATHETERIZATION  11/99   CI per PMH   CATARACT EXTRACTION W/PHACO Right 11/24/2016   Procedure: CATARACT EXTRACTION PHACO AND INTRAOCULAR LENS PLACEMENT ([place]);  Surgeon: Birder Robson, MD;  Location: [facility] ORS;  Service: Ophthalmology;  Laterality: Right;  [place] 1:04.3 AP% 22.3 CDE 14.35 Fluid pack lot # [id number]   CATARACT EXTRACTION W/PHACO Left 12/29/2016   Procedure: CATARACT EXTRACTION PHACO AND INTRAOCULAR LENS PLACEMENT (IOC);  Surgeon: Birder Robson, MD;  Location: [facility] ORS;  Service: Ophthalmology;  Laterality: Left;  [place] 00:52 AP% 18.5 CDE 9.67 Fluid pack lot # [id number]  COLONOSCOPY     COLONOSCOPY WITH PROPOFOL N/A 05/25/2017   Procedure: COLONOSCOPY WITH PROPOFOL;  Surgeon: Jonathon Bellows, MD;  Location: [facility] ENDOSCOPY;  Service: Gastroenterology;  Laterality: N/A;   CORONARY ANGIOPLASTY     STENT   CORONARY ARTERY BYPASS GRAFT     stent    ESOPHAGOGASTRODUODENOSCOPY (EGD) WITH PROPOFOL N/A 05/25/2017   Procedure: ESOPHAGOGASTRODUODENOSCOPY (EGD) WITH PROPOFOL;  Surgeon: Jonathon Bellows, MD;  Location: [facility] ENDOSCOPY;  Service: Gastroenterology;  Laterality: N/A;   EYE SURGERY     FLEXIBLE BRONCHOSCOPY N/A 05/19/2017   Procedure: FLEXIBLE BRONCHOSCOPY;  Surgeon: Wilhelmina Mcardle, MD;  Location: [facility] ORS;  Service: Pulmonary;  Laterality: N/A;   open heart surgery  2004   PFO repair   RIGHT/LEFT HEART CATH AND CORONARY ANGIOGRAPHY N/A 05/03/2018   Procedure: RIGHT/LEFT HEART CATH AND CORONARY ANGIOGRAPHY;  Surgeon: Nelva Bush, MD;  Location: [place] CV LAB;  Service: Cardiovascular;  Laterality: N/A;   UPPER GI ENDOSCOPY      FAMILY HISTORY: Family History  Problem Relation Age of Onset   Coronary artery disease Other        family hx   Breast cancer Other        1st egree relative <50   Cancer Mother    Heart disease Father     ADVANCED DIRECTIVES (Y/N):  _0 @  HEALTH MAINTENANCE: Social History   Tobacco Use   Smoking status: Former Smoker    Packs/day: 0.50    Years: 45.00    Pack years: 22.50    Types: Cigarettes    Last attempt to quit: 01/25/2018    Years since quitting: 0.9   Smokeless tobacco: Never Used   Tobacco comment: 1 ppd +40 years  Substance Use Topics   Alcohol use: Not Currently    Alcohol/week: 0.0 standard drinks    Comment: weekly but last dose 1 month   Drug use: No     Colonoscopy:  PAP:  Bone density:  Lipid panel:  Allergies  Allergen Reactions   Prilosec [Omeprazole] Diarrhea    Current Facility-Administered Medications  Medication Dose Route Frequency Provider Last Rate Last Dose   0.9 %  sodium chloride infusion   Intravenous PRN Vaughan Basta, MD 10 mL/hr at 01/03/19 2151 250 mL at 01/03/19 2151   acetaminophen (TYLENOL) tablet 650 mg  650 mg Oral Q6H PRN Henreitta Leber, MD       Or   acetaminophen (TYLENOL) suppository 650 mg   650 mg Rectal Q6H PRN Henreitta Leber, MD       aspirin chewable tablet 81 mg  81 mg Oral Daily Henreitta Leber, MD   81 mg at 01/03/19 0834   atorvastatin (LIPITOR) tablet 40 mg  40 mg Oral q1800 Henreitta Leber, MD   40 mg at 01/03/19 1746   benzonatate (TESSALON) capsule 200 mg  200 mg Oral TID PRN Henreitta Leber, MD       budesonide (PULMICORT) nebulizer solution 0.5 mg  0.5 mg Nebulization BID Verdell Carmine, Vivek J, MD   0.5 mg at 01/03/19 2007   carvedilol (COREG) tablet 12.5 mg  12.5 mg Oral BID Henreitta Leber, MD   12.5 mg at 01/03/19 0835   ceFEPIme (MAXIPIME) 2 g in sodium chloride 0.9 % 100 mL IVPB  2 g Intravenous Q8H Vaughan Basta, MD 200 mL/hr at 01/03/19 2152 2 g at 01/03/19 2152   cholecalciferol (VITAMIN D) tablet 2,000 Units  2,000 Units Oral QPM Abel Presto  J, MD   2,000 Units at 01/03/19 1746   fluticasone (FLONASE) 50 MCG/ACT nasal spray 2 spray  2 spray Each Nare Daily Vaughan Basta, MD   2 spray at 01/03/19 0820   gabapentin (NEURONTIN) capsule 300 mg  300 mg Oral QHS Henreitta Leber, MD   300 mg at 01/03/19 2112   insulin aspart (novoLOG) injection 0-5 Units  0-5 Units Subcutaneous QHS Henreitta Leber, MD   4 Units at 01/03/19 2123   insulin aspart (novoLOG) injection 0-9 Units  0-9 Units Subcutaneous TID WC Henreitta Leber, MD   7 Units at 01/03/19 1746   insulin aspart (novoLOG) injection 4 Units  4 Units Subcutaneous TID WC Vaughan Basta, MD   4 Units at 01/03/19 1746   insulin glargine (LANTUS) injection 15 Units  15 Units Subcutaneous Daily Vaughan Basta, MD   15 Units at 01/03/19 1026   ipratropium-albuterol (DUONEB) 0.5-2.5 (3) MG/3ML nebulizer solution 3 mL  3 mL Nebulization Q4H PRN Vaughan Basta, MD   3 mL at 01/01/19 0414   ipratropium-albuterol (DUONEB) 0.5-2.5 (3) MG/3ML nebulizer solution 3 mL  3 mL Nebulization Q4H Vaughan Basta, MD   3 mL at 01/03/19 2007   ivabradine (CORLANOR)  tablet 5 mg  5 mg Oral BID WC Henreitta Leber, MD   5 mg at 01/03/19 1755   loratadine (CLARITIN) tablet 10 mg  10 mg Oral Daily Henreitta Leber, MD   10 mg at 01/03/19 0834   LORazepam (ATIVAN) tablet 1 mg  1 mg Oral TID PRN Vaughan Basta, MD   1 mg at 01/03/19 2112   losartan (COZAAR) tablet 12.5 mg  12.5 mg Oral Daily Henreitta Leber, MD   12.5 mg at 01/03/19 8786   MEDLINE mouth rinse  15 mL Mouth Rinse BID Vaughan Basta, MD   15 mL at 01/02/19 2042   methylPREDNISolone sodium succinate (SOLU-MEDROL) 40 mg/mL injection 40 mg  40 mg Intravenous Q6H Henreitta Leber, MD   40 mg at 01/03/19 2124   morphine CONCENTRATE 10 MG/0.5ML oral solution 5 mg  5 mg Oral Q4H PRN Basilio Cairo, NP   5 mg at 01/03/19 2122   nystatin (MYCOSTATIN) 100000 UNIT/ML suspension 500,000 Units  5 mL Oral QID Gardiner Barefoot H, NP   500,000 Units at 01/03/19 2124   ondansetron (ZOFRAN) tablet 4 mg  4 mg Oral Q6H PRN Henreitta Leber, MD       Or   ondansetron (ZOFRAN) injection 4 mg  4 mg Intravenous Q6H PRN Sainani, Belia Heman, MD       pantoprazole (PROTONIX) EC tablet 40 mg  40 mg Oral Daily Henreitta Leber, MD   40 mg at 01/03/19 7672   spironolactone (ALDACTONE) tablet 25 mg  25 mg Oral Daily Henreitta Leber, MD   25 mg at 01/03/19 0834   tiZANidine (ZANAFLEX) tablet 4 mg  4 mg Oral Nightly Henreitta Leber, MD   4 mg at 01/02/19 2043   torsemide (DEMADEX) tablet 40 mg  40 mg Oral Daily Henreitta Leber, MD   40 mg at 01/03/19 0947   vitamin C (ASCORBIC ACID) tablet 500 mg  500 mg Oral Daily Henreitta Leber, MD   500 mg at 01/03/19 0834    OBJECTIVE: Vitals:   01/03/19 1937 01/03/19 2007  BP: (!) 142/57   Pulse: 81   Resp: 18   Temp: (!) 97.5 F (36.4 C)   SpO2: 91% 91%  Body mass index is 34.31 kg/m.    ECOG FS:2 - Symptomatic, <50% confined to bed  General: Ill-appearing, mild respiratory distress. Eyes: Pink conjunctiva, anicteric sclera. HEENT: Normocephalic,  moist mucous membranes, clear oropharnyx. Lungs: Diminished breath sounds. Heart: Regular rate and rhythm. No rubs, murmurs, or gallops. Abdomen: Soft, nontender, nondistended. No organomegaly noted, normoactive bowel sounds. Musculoskeletal: No edema, cyanosis, or clubbing. Neuro: Alert, answering all questions appropriately. Cranial nerves grossly intact. Skin: No rashes or petechiae noted.  Ecchymosis noted on bilateral upper extremity. Psych: Normal affect.   LAB RESULTS:  Lab Results  Component Value Date   NA 130 (L) 01/03/2019   K 4.3 01/03/2019   CL 95 (L) 01/03/2019   CO2 21 (L) 01/03/2019   GLUCOSE 369 (H) 01/03/2019   BUN 73 (H) 01/03/2019   CREATININE 1.27 (H) 01/03/2019   CALCIUM 8.3 (L) 01/03/2019   PROT 7.0 01/03/2019   ALBUMIN 3.4 (L) 12/24/2018   AST 41 12/13/2018   ALT 34 12/20/2018   ALKPHOS 80 12/24/2018   BILITOT 1.6 (H) 12/13/2018   GFRNONAA 58 (L) 01/03/2019   GFRAA >60 01/03/2019    Lab Results  Component Value Date   WBC 59.6 (HH) 01/03/2019   NEUTROABS 31.8 (H) 12/13/2018   HGB 8.3 (L) 01/03/2019   HCT 25.6 (L) 01/03/2019   MCV 105.3 (H) 01/03/2019   PLT 11 (LL) 01/03/2019     STUDIES: Dg Chest 2 View  Result Date: 01/03/2019 CLINICAL DATA:  68 year old male with a history of respiratory failure EXAM: CHEST - 2 VIEW COMPARISON:  11/23/2018, 12/23/2018, CT 12/13/2018 FINDINGS: Cardiomediastinal silhouette unchanged in size and contour. Surgical changes of median sternotomy. Redemonstration of diffuse reticulonodular pattern of opacity with no pleural effusion, interlobular septal thickening, pneumothorax. Patchy opacities in the bilateral lung bases similar to prior plain film and CT. No displaced fracture.  Degenerative changes of the spine IMPRESSION: Similar appearance of the chest x-ray with chronic reticulonodular opacities and minimal airspace opacities persisting in the lower lung bases. No new/worsening airspace disease Electronically  Signed   By: Corrie Mckusick D.O.   On: 01/03/2019 08:15   Ct Angio Chest Pe W And/or Wo Contrast  Result Date: 12/27/2018 CLINICAL DATA:  Worsening cough and shortness of breath. Recent hospitalization for pneumonia. EXAM: CT ANGIOGRAPHY CHEST WITH CONTRAST TECHNIQUE: Multidetector CT imaging of the chest was performed using the standard protocol during bolus administration of intravenous contrast. Multiplanar CT image reconstructions and MIPs were obtained to evaluate the vascular anatomy. CONTRAST:  54m OMNIPAQUE IOHEXOL 350 MG/ML SOLN COMPARISON:  Radiographs today.  CT 11/23/2018 and 05/13/2017. FINDINGS: Cardiovascular: The pulmonary arteries are well opacified with contrast to the level of the subsegmental branches. There is no evidence of acute pulmonary embolism. Extensive coronary artery atherosclerosis again noted with lesser involvement the aorta and great vessels. No acute vascular findings. There are calcifications the aortic valve. The heart size is normal. There is no pericardial effusion. Mediastinum/Nodes: There are no enlarged mediastinal, hilar or axillary lymph nodes.Stable small mediastinal lymph nodes. The thyroid gland, trachea and esophagus appear unchanged. Lungs/Pleura: There is no pleural effusion or pneumothorax. 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 There are moderate emphysematous changes. The left lower lobe consolidation seen on the most recent study has improved, although there are new patchy ground-glass and airspace opacities throughout both lower lobes and the lingula. These demonstrate a nonspecific  distribution. Upper abdomen: The visualized upper abdomen appears stable without suspicious findings. Borderline hepatic steatosis. Musculoskeletal/Chest wall: There is no chest wall mass or suspicious osseous finding. Review of the MIP images  confirms the above findings. IMPRESSION: 1. No evidence of acute pulmonary embolism. 2. The left lower lobe consolidation seen on the most recent study has improved. However, there are new patchy ground-glass and airspace opacities in both lower lobes and the lingula. These have a nonspecific distribution and could reflect bacterial or viral infection. COVID-19 testing today was negative. 3. Underlying chronic interstitial lung disease, likely due to a combination emphysema and other smoking related lung disease such as Langerhans cell histiocytosis as correlated with prior studies. 4. Coronary and Aortic Atherosclerosis (ICD10-I70.0). Electronically Signed   By: Richardean Sale M.D.   On: 12/27/2018 15:46   Dg Chest Portable 1 View  Result Date: 12/24/2018 CLINICAL DATA:  Cough and worsening shortness of breath. EXAM: PORTABLE CHEST 1 VIEW COMPARISON:  12/17/2018 FINDINGS: Prior median sternotomy is again noted. The cardiomediastinal silhouette is unchanged with normal heart size. Chronic interstitial densities throughout both lungs are unchanged. No acute airspace consolidation, overt pulmonary edema, pleural effusion, pneumothorax is identified. No acute osseous abnormality is seen. IMPRESSION: Chronic interstitial lung disease without evidence of acute abnormality. Electronically Signed   By: Logan Bores M.D.   On: 12/17/2018 13:22   Dg Chest Port 1 View  Result Date: 12/17/2018 CLINICAL DATA:  Worsening shortness of breath, cough, fever EXAM: PORTABLE CHEST 1 VIEW COMPARISON:  11/27/2018 FINDINGS: Previous median sternotomy. Normal heart size and vascularity. Similar diffuse chronic interstitial changes compatible with chronic lung disease. No effusion or pneumothorax. Trachea is midline. IMPRESSION: Stable chronic interstitial changes. No interval change or superimposed acute process. Electronically Signed   By: Jerilynn Mages.  Shick M.D.   On: 12/17/2018 12:45    ASSESSMENT: Acute on chronic respiratory  failure, CMML, worsening anemia and thrombocytopenia.  PLAN:    1.  Acute on chronic respiratory failure: Patient requires baseline 5 L of oxygen at home.  By report he was on 10 L over the weekend, but now is improved to 8 L.  Likely secondary to underlying pneumonia and COPD exacerbation.  Agree with antibiotics and IV steroids.  Appreciate palliative care input. 2.  CMML with worsening anemia/thrombocytopenia: Along with worsening leukocytosis possibly could be related to acute illness or progression of disease.  Patient would require bone marrow biopsy to confirm.  If his performance status improves, can consider treatment with Vidaza.  Patient has been instructed to keep his follow-up appointment at the cancer center next Monday for repeat laboratory work and further evaluation.  Continue to maintain hemoglobin greater than 8.0 given his respiratory status.  Transfuse if platelet count falls below 10.  All blood products should be irradiated.  Continue to check daily CBC.  Will follow.  Lloyd Huger, MD   01/03/2019 10:06 PM

## 2019-01-04 ENCOUNTER — Telehealth: Payer: Medicare HMO | Admitting: Family

## 2019-01-04 DIAGNOSIS — Z7952 Long term (current) use of systemic steroids: Secondary | ICD-10-CM

## 2019-01-04 DIAGNOSIS — Z794 Long term (current) use of insulin: Secondary | ICD-10-CM

## 2019-01-04 DIAGNOSIS — J209 Acute bronchitis, unspecified: Secondary | ICD-10-CM | POA: Diagnosis not present

## 2019-01-04 DIAGNOSIS — J189 Pneumonia, unspecified organism: Secondary | ICD-10-CM | POA: Diagnosis not present

## 2019-01-04 DIAGNOSIS — J9621 Acute and chronic respiratory failure with hypoxia: Secondary | ICD-10-CM | POA: Diagnosis not present

## 2019-01-04 DIAGNOSIS — Z7982 Long term (current) use of aspirin: Secondary | ICD-10-CM

## 2019-01-04 DIAGNOSIS — Z9981 Dependence on supplemental oxygen: Secondary | ICD-10-CM | POA: Diagnosis not present

## 2019-01-04 DIAGNOSIS — Z7951 Long term (current) use of inhaled steroids: Secondary | ICD-10-CM

## 2019-01-04 DIAGNOSIS — J440 Chronic obstructive pulmonary disease with acute lower respiratory infection: Secondary | ICD-10-CM | POA: Diagnosis not present

## 2019-01-04 DIAGNOSIS — Z87891 Personal history of nicotine dependence: Secondary | ICD-10-CM

## 2019-01-04 DIAGNOSIS — I110 Hypertensive heart disease with heart failure: Secondary | ICD-10-CM | POA: Diagnosis not present

## 2019-01-04 DIAGNOSIS — Z9119 Patient's noncompliance with other medical treatment and regimen: Secondary | ICD-10-CM

## 2019-01-04 DIAGNOSIS — I2510 Atherosclerotic heart disease of native coronary artery without angina pectoris: Secondary | ICD-10-CM

## 2019-01-04 DIAGNOSIS — R0603 Acute respiratory distress: Secondary | ICD-10-CM

## 2019-01-04 DIAGNOSIS — E1143 Type 2 diabetes mellitus with diabetic autonomic (poly)neuropathy: Secondary | ICD-10-CM | POA: Diagnosis not present

## 2019-01-04 DIAGNOSIS — I5042 Chronic combined systolic (congestive) and diastolic (congestive) heart failure: Secondary | ICD-10-CM | POA: Diagnosis not present

## 2019-01-04 LAB — CBC
HCT: 25.6 % — ABNORMAL LOW (ref 39.0–52.0)
HCT: 24.4 % — ABNORMAL LOW (ref 39.0–52.0)
Hemoglobin: 8 g/dL — ABNORMAL LOW (ref 13.0–17.0)
Hemoglobin: 7.9 g/dL — ABNORMAL LOW (ref 13.0–17.0)
MCH: 33.6 pg (ref 26.0–34.0)
MCH: 34.1 pg — ABNORMAL HIGH (ref 26.0–34.0)
MCHC: 31.3 g/dL (ref 30.0–36.0)
MCHC: 32.4 g/dL (ref 30.0–36.0)
MCV: 107.6 fL — ABNORMAL HIGH (ref 80.0–100.0)
MCV: 105.2 fL — ABNORMAL HIGH (ref 80.0–100.0)
Platelets: 19 10*3/uL — CL (ref 150–400)
Platelets: 13 10*3/uL — CL (ref 150–400)
RBC: 2.38 MIL/uL — ABNORMAL LOW (ref 4.22–5.81)
RBC: 2.32 MIL/uL — ABNORMAL LOW (ref 4.22–5.81)
RDW: 17.7 % — ABNORMAL HIGH (ref 11.5–15.5)
RDW: 20.2 % — ABNORMAL HIGH (ref 11.5–15.5)
WBC: 73.5 10*3/uL (ref 4.0–10.5)
WBC: 69 10*3/uL (ref 4.0–10.5)
nRBC: 0.1 % (ref 0.0–0.2)
nRBC: 1.3 % — ABNORMAL HIGH (ref 0.0–0.2)

## 2019-01-04 LAB — GLUCOSE, CAPILLARY
Glucose-Capillary: 274 mg/dL — ABNORMAL HIGH (ref 70–99)
Glucose-Capillary: 255 mg/dL — ABNORMAL HIGH (ref 70–99)

## 2019-01-04 LAB — BASIC METABOLIC PANEL
Anion gap: 13 (ref 5–15)
BUN: 89 mg/dL — ABNORMAL HIGH (ref 8–23)
CO2: 21 mmol/L — ABNORMAL LOW (ref 22–32)
Calcium: 8.4 mg/dL — ABNORMAL LOW (ref 8.9–10.3)
Chloride: 97 mmol/L — ABNORMAL LOW (ref 98–111)
Creatinine, Ser: 1.66 mg/dL — ABNORMAL HIGH (ref 0.61–1.24)
GFR calc Af Amer: 49 mL/min — ABNORMAL LOW (ref >60)
GFR calc non Af Amer: 42 mL/min — ABNORMAL LOW (ref >60)
Glucose, Bld: 317 mg/dL — ABNORMAL HIGH (ref 70–99)
Potassium: 5.1 mmol/L (ref 3.5–5.1)
Sodium: 131 mmol/L — ABNORMAL LOW (ref 135–145)

## 2019-01-04 MED ORDER — POLYVINYL ALCOHOL 1.4 % OP SOLN: 1.0000 [drp] | Freq: Four times a day (QID) | OPHTHALMIC | Status: DC | PRN

## 2019-01-04 MED ORDER — TRAZODONE HCL 50 MG PO TABS: 25.0000 mg | ORAL_TABLET | Freq: Every evening | ORAL | Status: DC | PRN

## 2019-01-04 MED ORDER — HALOPERIDOL LACTATE 5 MG/ML IJ SOLN
2.0000 mg | INTRAMUSCULAR | Status: DC | PRN
  Filled 2019-01-04: qty 1

## 2019-01-04 MED ORDER — HALOPERIDOL 0.5 MG PO TABS
0.5000 mg | ORAL_TABLET | ORAL | Status: DC | PRN
  Filled 2019-01-04: qty 1

## 2019-01-04 MED ORDER — SODIUM CHLORIDE 0.9 % IV SOLN
12.5000 mg | Freq: Four times a day (QID) | INTRAVENOUS | Status: DC | PRN
  Filled 2019-01-04: qty 0.5

## 2019-01-04 MED ORDER — SODIUM CHLORIDE 0.9% FLUSH
3.0000 mL | Freq: Two times a day (BID) | INTRAVENOUS | Status: DC
  Administered 2019-01-04: 3 mL via INTRAVENOUS

## 2019-01-04 MED ORDER — HALOPERIDOL LACTATE 5 MG/ML IJ SOLN: 0.5000 mg | INTRAMUSCULAR | Status: DC | PRN

## 2019-01-04 MED ORDER — MORPHINE 100MG IN NS 100ML (1MG/ML) PREMIX INFUSION
1.0000 mg/h | INTRAVENOUS | Status: DC
  Filled 2019-01-04: qty 100

## 2019-01-04 MED ORDER — MORPHINE SULFATE 2 MG/ML IV SOLN
INTRAVENOUS | Status: DC
  Filled 2019-01-04: qty 25
  Filled 2019-01-04: qty 30

## 2019-01-04 MED ORDER — MORPHINE SULFATE (CONCENTRATE) 10 MG/0.5ML PO SOLN
5.0000 mg | ORAL | Status: DC | PRN
  Administered 2019-01-04: 12:00:00 5 mg via SUBLINGUAL
  Filled 2019-01-04: qty 1

## 2019-01-04 MED ORDER — LORAZEPAM 1 MG PO TABS: 1.0000 mg | ORAL_TABLET | ORAL | Status: DC | PRN

## 2019-01-04 MED ORDER — NALOXONE HCL 0.4 MG/ML IJ SOLN: 0.4000 mg | INTRAMUSCULAR | Status: DC | PRN

## 2019-01-04 MED ORDER — GLYCOPYRROLATE 1 MG PO TABS
1.0000 mg | ORAL_TABLET | ORAL | Status: DC | PRN
  Filled 2019-01-04: qty 1

## 2019-01-04 MED ORDER — DIPHENHYDRAMINE HCL 50 MG/ML IJ SOLN: 12.5000 mg | INTRAMUSCULAR | Status: DC | PRN

## 2019-01-04 MED ORDER — GLYCOPYRROLATE 0.2 MG/ML IJ SOLN
0.2000 mg | INTRAMUSCULAR | Status: DC | PRN
  Filled 2019-01-04: qty 1

## 2019-01-04 MED ORDER — LORAZEPAM 2 MG/ML PO CONC: 1.0000 mg | ORAL | Status: DC | PRN

## 2019-01-04 MED ORDER — DIPHENHYDRAMINE HCL 50 MG/ML IJ SOLN: 12.5000 mg | Freq: Four times a day (QID) | INTRAMUSCULAR | Status: DC | PRN

## 2019-01-04 MED ORDER — LORAZEPAM 2 MG/ML IJ SOLN
1.0000 mg | INTRAMUSCULAR | Status: DC | PRN
  Administered 2019-01-04: 14:00:00 1 mg via INTRAVENOUS
  Filled 2019-01-04: qty 1

## 2019-01-04 MED ORDER — SODIUM CHLORIDE 0.9 % IV SOLN
2.0000 g | Freq: Two times a day (BID) | INTRAVENOUS | Status: DC
  Filled 2019-01-04: qty 2

## 2019-01-04 MED ORDER — MORPHINE SULFATE (PF) 2 MG/ML IV SOLN: 1.0000 mg | INTRAVENOUS | Status: DC | PRN

## 2019-01-04 MED ORDER — MORPHINE BOLUS VIA INFUSION
1.0000 mg | INTRAVENOUS | Status: DC | PRN
  Filled 2019-01-04: qty 2

## 2019-01-04 MED ORDER — LORAZEPAM 2 MG/ML IJ SOLN
1.0000 mg | INTRAMUSCULAR | Status: DC | PRN
  Filled 2019-01-04: qty 1

## 2019-01-04 MED ORDER — SODIUM CHLORIDE 0.9 % IV SOLN: 250.0000 mL | INTRAVENOUS | Status: DC | PRN

## 2019-01-04 MED ORDER — MORPHINE SULFATE (PF) 2 MG/ML IV SOLN
2.0000 mg | INTRAVENOUS | Status: DC | PRN
  Administered 2019-01-04: 14:00:00 2 mg via INTRAVENOUS
  Filled 2019-01-04: qty 1

## 2019-01-04 MED ORDER — MORPHINE SULFATE (CONCENTRATE) 10 MG/0.5ML PO SOLN: 5.0000 mg | ORAL | Status: DC | PRN

## 2019-01-04 MED ORDER — BIOTENE DRY MOUTH MT LIQD: 15.0000 mL | OROMUCOSAL | Status: DC | PRN

## 2019-01-04 MED ORDER — SODIUM CHLORIDE 0.9 % IV BOLUS
250.0000 mL | Freq: Once | INTRAVENOUS | Status: AC
  Administered 2019-01-04: 250 mL via INTRAVENOUS

## 2019-01-04 MED ORDER — ALUM & MAG HYDROXIDE-SIMETH 200-200-20 MG/5ML PO SUSP: 30.0000 mL | Freq: Four times a day (QID) | ORAL | Status: DC | PRN

## 2019-01-04 MED ORDER — BISACODYL 10 MG RE SUPP: 10.0000 mg | Freq: Every day | RECTAL | Status: DC | PRN

## 2019-01-04 MED ORDER — NYSTATIN 100000 UNIT/GM EX POWD: Freq: Three times a day (TID) | CUTANEOUS | Status: DC | PRN

## 2019-01-04 MED ORDER — DIPHENHYDRAMINE HCL 12.5 MG/5ML PO ELIX
12.5000 mg | ORAL_SOLUTION | Freq: Four times a day (QID) | ORAL | Status: DC | PRN
  Filled 2019-01-04: qty 5

## 2019-01-04 MED ORDER — HALOPERIDOL LACTATE 2 MG/ML PO CONC
0.5000 mg | ORAL | Status: DC | PRN
  Filled 2019-01-04: qty 0.3

## 2019-01-04 MED ORDER — ONDANSETRON HCL 4 MG/2ML IJ SOLN: 4.0000 mg | Freq: Four times a day (QID) | INTRAMUSCULAR | Status: DC | PRN

## 2019-01-04 MED ORDER — SODIUM CHLORIDE 0.9% FLUSH: 3.0000 mL | INTRAVENOUS | Status: DC | PRN

## 2019-01-04 MED ORDER — SODIUM CHLORIDE 0.9% FLUSH: 9.0000 mL | INTRAVENOUS | Status: DC | PRN

## 2019-01-04 MED ORDER — OXYBUTYNIN CHLORIDE 5 MG PO TABS: 2.5000 mg | ORAL_TABLET | Freq: Four times a day (QID) | ORAL | Status: DC | PRN

## 2019-01-05 LAB — COMP PANEL: LEUKEMIA/LYMPHOMA

## 2019-01-05 LAB — CULTURE, BLOOD (ROUTINE X 2)
Culture: NO GROWTH
Culture: NO GROWTH
Special Requests: ADEQUATE
Special Requests: ADEQUATE

## 2019-01-06 ENCOUNTER — Telehealth: Payer: Self-pay | Admitting: Family Medicine

## 2019-01-06 ENCOUNTER — Inpatient Hospital Stay: Payer: Medicare HMO

## 2019-01-06 NOTE — Telephone Encounter (Signed)
Best number [phone] [place] Citizen (daughter) called you let you know pt passed on 11/13/2022 @[facility] 

## 2019-01-09 ENCOUNTER — Inpatient Hospital Stay: Payer: Medicare HMO | Admitting: Oncology

## 2019-01-09 NOTE — Death Summary Note (Signed)
Tony Watson at [place] NAME: Tony Watson    MR#:  [id number]  DATE OF BIRTH:  31-Jul-1951  DATE OF ADMISSION:  12/15/2018 ADMITTING PHYSICIAN: Henreitta Leber, MD  DATE OF DISCHARGE: No discharge date for patient encounter.  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 Arthritis   . CAD (coronary artery disease)    a. inf MI 11/99 (in [place]) w/ PCI to [facility]; b. [place] 2004 [facility]): EF 50%, patent RCA stent, no obs dz; c. MV 2008: EF 42%, inf infarct, no ischemia; d. R/LHC 9/19: 3-v dz w/ mild dif dz LM, m-dLAD 50, dLAD 60, D2 80, RI 80, mRCA-1 90, mRCA-2 20, patent RCA stent, CO/CI 3.7/2  . Chronic combined systolic (congestive) and diastolic (congestive) heart failure ([place])    a. 7/29019 Echo: EF 25-30%, sev diff HK, mild MR mod dil LA, mildly reduced RVSF, elevated CVP  . Chronic respiratory failure with hypoxia (HCC)    a. on 2L supplemental oxygen via [place] followed by pulmonology  . CML (chronic myelocytic leukemia) ([place])   . COPD (chronic obstructive pulmonary disease) ([place])   . Diabetes mellitus without complication ([place])   . Diabetic autonomic neuropathy associated with type 2 diabetes mellitus ([place]) 03/27/2015  . Diverticulosis 2002  . GERD (gastroesophageal reflux disease)   . HTN (hypertension)   . Hyperlipidemia    . ILD (interstitial lung disease) ([place])   . Ischemic cardiomyopathy   . Medical non-compliance   . Smoker   . Spinal stenosis   . TIA (transient ischemic attack)    a. s/p PFO closure 2004 (in [place])  . Ulcer     HOSPITAL COURSE:  Death Note: TOD 1515 Pt without CN reflexes, pulse, respirations, pronounced by nursing staff  Acute on chronic hypoxic respiratory failure  DISCHARGE CONDITIONS:  death  CONSULTS OBTAINED:  Treatment Team:  Lequita Asal, MD , Avel Peace, MD  DRUG ALLERGIES:   Allergies  Allergen Reactions  . Prilosec [Omeprazole] Diarrhea    DISCHARGE MEDICATIONS:   Allergies as of 2019-01-23      Reactions   Prilosec [omeprazole] Diarrhea      Medication List    STOP taking these medications   acetaminophen 500 MG tablet Commonly known as:  TYLENOL   aspirin 81 MG chewable tablet   atorvastatin 40 MG tablet Commonly known as:  LIPITOR   benzonatate 200 MG capsule Commonly known as:  TESSALON   blood glucose meter kit and supplies   budesonide 0.5 MG/2ML nebulizer solution Commonly known as:  PULMICORT   carvedilol 12.5 MG tablet Commonly known as:  COREG   cetirizine 10 MG chewable tablet Commonly known as:  ZYRTEC   formoterol 20 MCG/2ML nebulizer solution Commonly known as:  PERFOROMIST   gabapentin 300 MG capsule Commonly known as:  NEURONTIN   guaiFENesin-codeine 100-10 MG/5ML syrup   insulin aspart 100  UNIT/ML injection Commonly known as:  novoLOG   insulin glargine 100 UNIT/ML injection Commonly known as:  LANTUS   ipratropium-albuterol 0.5-2.5 (3) MG/3ML Soln Commonly known as:  DUONEB   ivabradine 5 MG Tabs tablet Commonly known as:  Corlanor   LORazepam 0.5 MG tablet Commonly known as:  ATIVAN   losartan 25 MG tablet Commonly known as:  COZAAR   multivitamin with minerals Tabs tablet   nystatin 100000 UNIT/ML suspension Commonly known as:  MYCOSTATIN   ondansetron 4 MG disintegrating  tablet Commonly known as:  Zofran ODT   spironolactone 25 MG tablet Commonly known as:  ALDACTONE   tiZANidine 4 MG tablet Commonly known as:  ZANAFLEX   torsemide 20 MG tablet Commonly known as:  DEMADEX   vitamin C 500 MG tablet Commonly known as:  ASCORBIC ACID   Vitamin D 50 MCG (2000 UT) Caps        DISCHARGE INSTRUCTIONS:    If you experience worsening of your admission symptoms, develop shortness of breath, life threatening emergency, suicidal or homicidal thoughts you must seek medical attention immediately by calling 911 or calling your MD immediately  if symptoms less severe.  You Must read complete instructions/literature along with all the possible adverse reactions/side effects for all the Medicines you take and that have been prescribed to you. Take any new Medicines after you have completely understood and accept all the possible adverse reactions/side effects.   Please note  You were cared for by a hospitalist during your hospital stay. If you have any questions about your discharge medications or the care you received while you were in the hospital after you are discharged, you can call the unit and asked to speak with the hospitalist on call if the hospitalist that took care of you is not available. Once you are discharged, your primary care physician will handle any further medical issues. Please note that NO REFILLS for any discharge medications will be authorized once you are discharged, as it is imperative that you return to your primary care physician (or establish a relationship with a primary care physician if you do not have one) for your aftercare needs so that they can reassess your need for medications and monitor your lab values.    Today   CHIEF COMPLAINT:   Chief Complaint  Patient presents with  . Shortness of Breath    HISTORY OF PRESENT ILLNESS:  Tony Watson  is a 68 y.o. male with a known history of terminal state, admited for  respiratory failure   VITAL SIGNS:  Blood pressure (!) 115/55, pulse 80, temperature (!) 97.5 F (36.4 C), temperature source Oral, resp. rate 20, height _0  (1.803 m), weight 111.6 kg, SpO2 (!) 88 %.  I/O:    Intake/Output Summary (Last 24 hours) at 01/05/19 1612 Last data filed at 01-05-2019 0900 Gross per 24 hour  Intake 959.96 ml  Output 600 ml  Net 359.96 ml    PHYSICAL EXAMINATION:  GENERAL:  69 y.o.-year-old patient lying in the bed with no acute distress.  EYES: Pupils equal, round, reactive to light and accommodation. No scleral icterus. Extraocular muscles intact.  HEENT: Head atraumatic, normocephalic. Oropharynx and nasopharynx clear.  NECK:  Supple, no jugular venous distention. No thyroid enlargement, no tenderness.  LUNGS: Normal breath sounds bilaterally, no wheezing, rales,rhonchi or crepitation. No use of accessory muscles of respiration.  CARDIOVASCULAR: S1, S2 normal. No murmurs, rubs, or gallops.  ABDOMEN: Soft, non-tender, non-distended. Bowel sounds present. No organomegaly  or mass.  EXTREMITIES: No pedal edema, cyanosis, or clubbing.  NEUROLOGIC: Cranial nerves II through XII are intact. Muscle strength 5/5 in all extremities. Sensation intact. Gait not checked.  PSYCHIATRIC: The patient is alert and oriented x 3.  SKIN: No obvious rash, lesion, or ulcer.   DATA REVIEW:   CBC Recent Labs  Lab 01/27/19 0430  WBC 69.0*  HGB 7.9*  HCT 24.4*  PLT 13*    Chemistries  Recent Labs  Lab 12/16/2018 1308  01-27-19 0430  NA 135   < > 131*  K 3.1*   < > 5.1  CL 92*   < > 97*  CO2 33*   < > 21*  GLUCOSE 79   < > 317*  BUN 33*   < > 89*  CREATININE 1.05   < > 1.66*  CALCIUM 8.6*   < > 8.4*  AST 41  --   --   ALT 34  --   --   ALKPHOS 80  --   --   BILITOT 1.6*  --   --    < > = values in this interval not displayed.    Cardiac Enzymes No results for input(s): TROPONINI in the last 168 hours.  Microbiology Results  Results for orders placed  or performed during the hospital encounter of 01/08/2019  Culture, blood (Routine x 2)     Status: None (Preliminary result)   Collection Time: 01/02/2019  1:08 PM  Result Value Ref Range Status   Specimen Description BLOOD LEFT ARM  Final   Special Requests   Final    BOTTLES DRAWN AEROBIC AND ANAEROBIC Blood Culture adequate volume   Culture   Final    NO GROWTH 4 DAYS Performed at [facility], [address], [place], [place] [address]    Report Status PENDING  Incomplete  Culture, blood (Routine x 2)     Status: None (Preliminary result)   Collection Time: 12/09/2018  1:08 PM  Result Value Ref Range Status   Specimen Description BLOOD LAC  Final   Special Requests   Final    BOTTLES DRAWN AEROBIC AND ANAEROBIC Blood Culture adequate volume   Culture   Final    NO GROWTH 4 DAYS Performed at [facility], [address], [place], [place] [address]    Report Status PENDING  Incomplete  SARS Coronavirus 2 (CEPHEID- Performed in [facility] hospital lab), Hosp Order     Status: None   Collection Time: 12/27/2018  1:08 PM  Result Value Ref Range Status   SARS Coronavirus 2 NEGATIVE NEGATIVE Final    Comment: (NOTE) If result is NEGATIVE SARS-CoV-2 target nucleic acids are NOT DETECTED. The SARS-CoV-2 RNA is generally detectable in upper and lower  respiratory specimens during the acute phase of infection. The lowest  concentration of SARS-CoV-2 viral copies this assay can detect is 250  copies / mL. A negative result does not preclude SARS-CoV-2 infection  and should not be used as the sole basis for treatment or other  patient management decisions.  A negative result may occur with  improper specimen collection / handling, submission of specimen other  than nasopharyngeal swab, presence of viral mutation(s) within the  areas targeted by this assay, and inadequate number of viral copies  (<250 copies / mL). A negative result must be combined with clinical   observations, patient history, and epidemiological information. If result is POSITIVE SARS-CoV-2 target nucleic acids are DETECTED. The SARS-CoV-2 RNA  is generally detectable in upper and lower  respiratory specimens dur ing the acute phase of infection.  Positive  results are indicative of active infection with SARS-CoV-2.  Clinical  correlation with patient history and other diagnostic information is  necessary to determine patient infection status.  Positive results do  not rule out bacterial infection or co-infection with other viruses. If result is PRESUMPTIVE POSTIVE SARS-CoV-2 nucleic acids MAY BE PRESENT.   A presumptive positive result was obtained on the submitted specimen  and confirmed on repeat testing.  While 2019 novel coronavirus  (SARS-CoV-2) nucleic acids may be present in the submitted sample  additional confirmatory testing may be necessary for epidemiological  and / or clinical management purposes  to differentiate between  SARS-CoV-2 and other Sarbecovirus currently known to infect humans.  If clinically indicated additional testing with an alternate test  methodology [id number]) is advised. The SARS-CoV-2 RNA is generally  detectable in upper and lower respiratory sp ecimens during the acute  phase of infection. The expected result is Negative. Fact Sheet for Patients:  StrictlyIdeas.no Fact Sheet for Healthcare Providers: BankingDealers.co.za This test is not yet approved or cleared by the [place] FDA and has been authorized for detection and/or diagnosis of SARS-CoV-2 by FDA under an Emergency Use Authorization (EUA).  This EUA will remain in effect (meaning this test can be used) for the duration of the COVID-19 declaration under Section 564(b)(1) of the Act, 21 U.S.C. section 360bbb-3(b)(1), unless the authorization is terminated or revoked sooner. Performed at [facility], [address] [place], [place] [address]   MRSA PCR Screening     Status: None   Collection Time: 01/02/2019  6:47 PM  Result Value Ref Range Status   MRSA by PCR NEGATIVE NEGATIVE Final    Comment:        The GeneXpert MRSA Assay (FDA approved for NASAL specimens only), is one component of a comprehensive MRSA colonization surveillance program. It is not intended to diagnose MRSA infection nor to guide or monitor treatment for MRSA infections. Performed at [facility], [address] [place], [place] [address]     RADIOLOGY:  Dg Chest 2 View  Result Date: 01/03/2019 CLINICAL DATA:  68 year old male with a history of respiratory failure EXAM: CHEST - 2 VIEW COMPARISON:  11/23/2018, 12/23/2018, CT 12/27/2018 FINDINGS: Cardiomediastinal silhouette unchanged in size and contour. Surgical changes of median sternotomy. Redemonstration of diffuse reticulonodular pattern of opacity with no pleural effusion, interlobular septal thickening, pneumothorax. Patchy opacities in the bilateral lung bases similar to prior plain film and CT. No displaced fracture.  Degenerative changes of the spine IMPRESSION: Similar appearance of the chest x-ray with chronic reticulonodular opacities and minimal airspace opacities persisting in the lower lung bases. No new/worsening airspace disease Electronically Signed   By: Corrie Mckusick D.O.   On: 01/03/2019 08:15    EKG:   Orders placed or performed in visit on 12/28/2018  . EKG 12-Lead  . EKG 12-Lead  . EKG 12-Lead      Management plans discussed with the patient, family and they are in agreement.  CODE STATUS: dnr/comfort care    Code Status Orders  (From admission, onward)         Start     Ordered   Jan 17, 2019 1057  Do not attempt resuscitation (DNR)  Continuous    Question Answer Comment  In the event of cardiac or respiratory ARREST Do not call a "code blue"   In the  event of cardiac or respiratory ARREST Do not perform Intubation, CPR,  defibrillation or ACLS   In the event of cardiac or respiratory ARREST Use medication by any route, position, wound care, and other measures to relive pain and suffering. May use oxygen, suction and manual treatment of airway obstruction as needed for comfort.   Comments Nurse to pronounce      Jan 06, 2019 1059        Code Status History    Date Active Date Inactive Code Status Order ID Comments User Context   12/12/2018 1726 01/06/19 1059 DNR [id number]  Henreitta Leber, MD ED   12/20/2018 1024 12/21/2018 1521 DNR [id number]  Laverle Hobby, MD Inpatient   12/17/2018 1854 12/20/2018 1024 Full Code [id number]  Vaughan Basta, MD Inpatient   11/23/2018 1540 11/28/2018 1641 DNR [id number]  Saundra Shelling, MD Inpatient   05/03/2018 1107 05/08/2018 1718 DNR [id number]  Nelva Bush, MD Inpatient   05/03/2018 0955 05/03/2018 1107 Full Code [id number]  Nelva Bush, MD Inpatient   05/02/2018 1609 05/03/2018 0955 DNR [id number]  Jimmy Footman, NP Inpatient   05/02/2018 1422 05/02/2018 1609 Full Code [id number]  Jimmy Footman, NP Inpatient   05/02/2018 1303 05/02/2018 1422 DNR [id number]  Jimmy Footman, NP Inpatient   05/01/2018 1254 05/02/2018 1303 Full Code [id number]  Nicholes Mango, MD Inpatient   03/24/2018 1948 03/28/2018 1340 Full Code [id number]  Saundra Shelling, MD Inpatient   03/03/2018 0328 03/06/2018 1716 Full Code [id number]  Harrie Foreman, MD Inpatient    Advance Directive Documentation     Most Recent Value  Type of Advance Directive  Healthcare Power of Attorney, Living will  Pre-existing out of facility DNR order (yellow form or pink MOST form)  -  "MOST" Form in Place?  -      TOTAL TIME TAKING CARE OF THIS PATIENT: 35 minutes.    Avel Peace  M.D on 01/06/19 at 4:12 PM  Between 7am to 6pm - Pager - [phone]  After 6pm go to [email or web address] - password [email or web address] [facility]  Office  [phone]  CC: Primary  care physician; Owens Loffler, MD   Note: This dictation was prepared with Dragon dictation along with smaller phrase technology. Any transcriptional errors that result from this process are unintentional.

## 2019-01-09 NOTE — Progress Notes (Signed)
Daily Progress Note   Patient Name: Tony Watson       Date: Jan 29, 2019 DOB: 08-Dec-1950  Age: 68 y.o. MRN#: [id number] Attending Physician: Gorden Harms, MD Primary Care Physician: Owens Loffler, MD Admit Date: 12/24/2018  Reason for Consultation/Follow-up: Establishing goals of care  Subjective: Checked on patient and family multiple times today. This afternoon, patient anxious and with respiratory distress with tachypnea, increased accessory muscle use. HFNC increased from 10L to 15L last night. Blood pressures soft. Drowsy and defers conversation to his family.   GOC: Approved for wife and daughter [name] and Andee Poles) to visit at bedside due to clinical decline and that patient appears to be transitioning to EOL.   Discussed course of hospitalization including diagnoses and interventions. Explained concern with worsening respiratory distress despite current plan of care and medications. 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   Discussed EOL expectations and poor prognosis possibly being days if not hours with recommendation for comfort care/comfort medications.   The difference between aggressive medical intervention and comfort care was considered in light of the patient's goals of care. Family confirms DNR/DNI code status.   Family acknowledges that he appears to be "suffering" and wish to make him "comfortable." Originally, they wanted to take him home with hospice services. At this point, they fear putting him through a transfer.   Educated on shift to comfort measures in order to allow comfort, dignity, peace at EOL. Educated on medications as needed for comfort. Explained interventions not aimed at comfort will be  discontinued. Family again confirms desire to focus on his comfort and initiation of comfort medications.   **Patient given Roxanol 5mg  PO x1, Morphine 2mg  IV x1 and Ativan 1mg  IV x1. Patient continues to struggle despite these medications. Discussed consideration of continuous morphine infusion with family and attending. Family agreeable to start continue morphine infusion.Marland Kitchenanything that will provide him relief of symptoms and allow comfort/peace at EOL.   Discussed with RN. Plan to initiate morphine infusion 1mg /hr.  Therapeutic listening as family shares stories of [name]. They are tearful but accepting he is at EOL and do not wish to see him suffer. Emotional/spiritual support provided.   **ADDENDUM 1500: Wife runs out of room reporting patient agitation and that he will not keep oxygen on. Upon  arrival to room. Patient appears to be terminally agitated with agonal respirations. RN coming to bedside with prn Haldol. Waiting on morphine infusion.   Stayed with patient, wife, and RN at bedside. Prior to patients death, he appeared comfortable and peaceful. Wife laying and holding patient as he passed. Therapeutic listening and emotional/spiritual support provided. Chaplain at bedside during EOL.    Length of Stay: 4  Current Medications: Scheduled Meds:  . aspirin  81 mg Oral Daily  . atorvastatin  40 mg Oral q1800  . budesonide (PULMICORT) nebulizer solution  0.5 mg Nebulization BID  . carvedilol  12.5 mg Oral BID  . cholecalciferol  2,000 Units Oral QPM  . fluticasone  2 spray Each Nare Daily  . gabapentin  300 mg Oral QHS  . insulin aspart  0-5 Units Subcutaneous QHS  . insulin aspart  0-9 Units Subcutaneous TID WC  . insulin aspart  4 Units Subcutaneous TID WC  . insulin glargine  15 Units Subcutaneous Daily  . ipratropium-albuterol  3 mL Nebulization Q4H  . ivabradine  5 mg Oral BID WC  . loratadine  10 mg Oral Daily  . losartan  12.5 mg Oral Daily  . mouth rinse  15 mL Mouth  Rinse BID  . methylPREDNISolone (SOLU-MEDROL) injection  40 mg Intravenous Q6H  . nystatin  5 mL Oral QID  . pantoprazole  40 mg Oral Daily  . spironolactone  25 mg Oral Daily  . tiZANidine  4 mg Oral Nightly  . torsemide  40 mg Oral Daily  . vitamin C  500 mg Oral Daily    Continuous Infusions: . sodium chloride 250 mL (01/24/2019 3299)  . ceFEPime (MAXIPIME) IV      PRN Meds: sodium chloride, acetaminophen **OR** acetaminophen, benzonatate, ipratropium-albuterol, LORazepam, morphine CONCENTRATE, ondansetron **OR** ondansetron (ZOFRAN) IV  Physical Exam Vitals signs and nursing note reviewed.  Constitutional:      Appearance: He is ill-appearing.  HENT:     Head: Normocephalic and atraumatic.  Cardiovascular:     Rate and Rhythm: Normal rate.  Pulmonary:     Effort: Tachypnea, accessory muscle usage and respiratory distress present.     Breath sounds: Wheezing and rhonchi present.     Comments: Minimal relief from morphine 2mg  x1. RN to initiate continuous morphine infusion.  Skin:    General: Skin is warm and dry.     Coloration: Skin is pale.     Findings: Ecchymosis present.  Neurological:     Mental Status: He is easily aroused.     Comments: Drowsy, distressed  Psychiatric:        Attention and Perception: He is inattentive.        Speech: Speech is delayed.            Vital Signs: BP (!) 115/55 (BP Location: Right Arm)   Pulse 80   Temp (!) 97.5 F (36.4 C) (Oral)   Resp 20   Ht 5\' 11"  (1.803 m)   Wt 111.6 kg   SpO2 (!) 88%   BMI 34.31 kg/m  SpO2: SpO2: (!) 88 % O2 Device: O2 Device: High Flow Nasal Cannula O2 Flow Rate: O2 Flow Rate (L/min): 15 L/min  Intake/output summary:   Intake/Output Summary (Last 24 hours) at 01-24-2019 1029 Last data filed at 24-Jan-2019 0900 Gross per 24 hour  Intake 959.96 ml  Output 600 ml  Net 359.96 ml   LBM: Last BM Date: 01/03/19 Baseline Weight: Weight: 111.6 kg Most recent weight: Weight:  111.6 kg        Palliative Assessment/Data: PPS 40%   Flowsheet Rows     Most Recent Value  Intake Tab  Referral Department  Hospitalist  Unit at Time of Referral  Med/Surg Unit  Palliative Care Primary Diagnosis  Pulmonary  Palliative Care Type  Return patient Palliative Care  Reason for referral  Clarify Goals of Care  Date first seen by Palliative Care  01/03/19  Clinical Assessment  Palliative Performance Scale Score  40%  Psychosocial & Spiritual Assessment  Palliative Care Outcomes  Patient/Family meeting held?  Yes  Who was at the meeting?  patient, daughter, wife  Palliative Care Outcomes  Clarified goals of care, Provided end of life care assistance, Provided psychosocial or spiritual support, ACP counseling assistance, Improved pain interventions, Improved non-pain symptom therapy, Counseled regarding hospice, Transitioned to hospice      Patient Active Problem List   Diagnosis Date Noted  . Palliative care by specialist   . HCAP (healthcare-associated pneumonia)   . Shortness of breath   . Anxiety state   . Acute on chronic respiratory failure ([place]) 12/17/2018  . Chronic systolic heart failure ([place]) 05/13/2018  . HTN (hypertension) 05/13/2018  . Acute on chronic combined systolic and diastolic congestive heart failure ([place])   . ILD (interstitial lung disease) ([place])   . Goals of care, counseling/discussion 04/28/2018    Class: Acute  . Ischemic cardiomyopathy 03/24/2018  . Protein-calorie malnutrition, severe 03/03/2018  . 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 Diabetic autonomic neuropathy associated with type 2 diabetes mellitus ([place]) 03/27/2015  . COPD exacerbation ([place]) 10/04/2014  . History of inferior MI (myocardial infarction) 10/14/2013  . CAD (coronary artery disease), native coronary artery   . History of noncompliance with medical treatment 05/30/2012  . Stage 4 very severe COPD by GOLD classification ([place]) 03/23/2011  .  ALLERGIC RHINITIS CAUSE UNSPECIFIED 07/31/2010  . Type 2 diabetes mellitus with vascular disease ([place]) 01/02/2010  . Ex-smoker 04/17/2009  . TRANSIENT ISCHEMIC ATTACKS, HX OF 04/17/2009  . Hyperlipidemia 12/19/2008    Palliative Care Assessment & Plan   Patient Profile: 68 y.o. male  with past medical history of chronic respiratory failure on 5L [place], COPD, ILD, CML followed by Dr. Grayland Ormond currently on active surveillance, combined congestive heart failure, ischemic cardiomyopathy EF 30-35%, three vessel CAD, HLD, HTN, DM,  admitted on 12/22/2018 with shortness of breath. Recent hospitalization due to sepsis/respiratory failure from pneumonia. Sent home with [facility]. This admission, recurrent acute on chronic respiratory failure from pneumonia and underlying COPD/interstitial lung exacerbation. Pulmonology consulted and agree with treatment plan with addition of chest PT and palliative consult. Patient with hx of CML followed by Dr. Grayland Ormond. Oncology consulted inpatient due to thrombocytopenia. Palliative medicine consultation for goals of care.  Assessment: Acute on chronic respiratory failure with hypoxia COPD Interstitial lung disease Pneumonia CML Leukocytosis Thrombocytopenia DM type 2 with neuropathy  Recommendations/Plan:  Clinical decline. Patient transitioning to EOL.   F/u GOC with wife and daughter. Family understands diagnoses, interventions, and poor prognosis. Transition to comfort measures only in order to allow comfort, peace, and dignity at EOL. DNR/DNI.   Symptom management  Respiratory distress despite prn morphine doses.   Initiate continuous Morphine infusion 1mg /hr.  RN may bolus Morphine via infusion 1-2mg  q58min prn pain/dyspnea/air hunger/tachypnea. RN may increase basal rate by 50% if 3 bolus doses given in one hour.   Ativan 1mg  IV q4h prn anxiety/agitation  Robinul 0.2mg  IV q4h  prn secretions  Haldol 2mg  IV q4h prn terminal agitation  RN can  titrate HFNC down once patient appears comfortable on morphine infusion and/or if family ready.  Family fearful to put him through transfer home with hospice services. Continue EOL care inpatient. Anticipate hospital death.    Code Status: DNR   Code Status Orders  (From admission, onward)         Start     Ordered   12/27/2018 1727  Do not attempt resuscitation (DNR)  Continuous    Question Answer Comment  In the event of cardiac or respiratory ARREST Do not call a "code blue"   In the event of cardiac or respiratory ARREST Do not perform Intubation, CPR, defibrillation or ACLS   In the event of cardiac or respiratory ARREST Use medication by any route, position, wound care, and other measures to relive pain and suffering. May use oxygen, suction and manual treatment of airway obstruction as needed for comfort.      12/18/2018 1726        Code Status History    Date Active Date Inactive Code Status Order ID Comments User Context   12/20/2018 1024 12/21/2018 1521 DNR [id number]  Laverle Hobby, MD Inpatient   12/17/2018 1854 12/20/2018 1024 Full Code [id number]  Vaughan Basta, MD Inpatient   11/23/2018 1540 11/28/2018 1641 DNR [id number]  Saundra Shelling, MD Inpatient   05/03/2018 1107 05/08/2018 1718 DNR [id number]  Nelva Bush, MD Inpatient   05/03/2018 0955 05/03/2018 1107 Full Code [id number]  End, Harrell Gave, MD Inpatient   05/02/2018 1609 05/03/2018 0955 DNR [id number]  Jimmy Footman, NP Inpatient   05/02/2018 1422 05/02/2018 1609 Full Code [id number]  Jimmy Footman, NP Inpatient   05/02/2018 1303 05/02/2018 1422 DNR [id number]  Jimmy Footman, NP Inpatient   05/01/2018 1254 05/02/2018 1303 Full Code [id number]  Nicholes Mango, MD Inpatient   03/24/2018 1948 03/28/2018 1340 Full Code [id number]  Saundra Shelling, MD Inpatient   03/03/2018 0328 03/06/2018 1716 Full Code [id number]  Harrie Foreman, MD Inpatient    Advance Directive Documentation      Most Recent Value  Type of Advance Directive  Healthcare Power of Attorney, Living will  Pre-existing out of facility DNR order (yellow form or pink MOST form)  -  "MOST" Form in Place?  -       Prognosis:   Hours - Days  Discharge Planning:  Anticipated Hospital Death  Care plan was discussed with patient, wife, daughter, RN, Dr Jerelyn Charles, chaplain  Thank you for allowing the Palliative Medicine Team to assist in the care of this patient.   Time In: 4097- 3532- Time Out: 0800 1545 Total Time 190 Prolonged Time Billed yes      Greater than 50%  of this time was spent counseling and coordinating care related to the above assessment and plan.  Ihor Dow, DNP, FNP-C Palliative Medicine Team  Phone: [phone] Fax: [phone]  Please contact Palliative Medicine Team phone at [phone] for questions and concerns.

## 2019-01-09 NOTE — Progress Notes (Signed)
Pastoral Care Visit   Jan 05, 2019 1500  Clinical Encounter Type  Visited With Patient  Visit Type Death;Patient actively dying;Spiritual support  Referral From Nurse  Consult/Referral To Chaplain  Spiritual Encounters  Spiritual Needs Prayer;Grief support  Stress Factors  Patient Stress Factors Health changes  Family Stress Factors Health changes;Loss;Major life changes   Chap assisted during active dying and death. Comforted wife.  Offered prayer, emotional support, grief support, and spiritual support. Melven Sartorius was assisted by RN and NP.  Darcey Nora, Chaplain

## 2019-01-09 NOTE — Progress Notes (Signed)
Pt complaining of SOB and feeling like he couldn't breathe. Morphine 5mg  given for air hunger. O2 sat at 88% on 10L high flow nasal canula. Respiratory therapy Merry Proud called. HFNC turned up to 15L, oxygen levels at 90-91%. Will continue to monitor.  Stacie Glaze, RN

## 2019-01-09 NOTE — Progress Notes (Signed)
Patient blood pressure 76/50 after 3 attempts. MD Mayo notified. 23ml bolus ordered. Per MD hold morning coreg, spironalactone, losartan and torsemide.  After bolus completion blood pressure 86/57 with a MAP of 68.  MD Mayo notified, orders to just continue to monitor this AM without the four listed medications.  Stacie Glaze, RN

## 2019-01-09 NOTE — Progress Notes (Addendum)
[facility] [place] NAME: Tony Watson    MR#:  [id number]  DATE OF BIRTH:  06/26/1951  SUBJECTIVE:  Patient evaluated this morning with nurse Stanton Kidney, given poor prognosis-patient wanted to be made comfort care going forward with plans for home hospice on tomorrow, case discussed with social work/case management-family is okay for visitation  REVIEW OF SYSTEMS:  CONSTITUTIONAL: No fever, fatigue or weakness.  EYES: No blurred or double vision.  EARS, NOSE, AND THROAT: No tinnitus or ear pain.  RESPIRATORY: have cough, shortness of breath, wheezing ,no hemoptysis.  CARDIOVASCULAR: No chest pain, orthopnea, edema.  GASTROINTESTINAL: No nausea, vomiting, diarrhea or abdominal pain.  GENITOURINARY: No dysuria, hematuria.  ENDOCRINE: No polyuria, nocturia,  HEMATOLOGY: No anemia, easy bruising or bleeding SKIN: No rash or lesion. MUSCULOSKELETAL: No joint pain or arthritis.   NEUROLOGIC: No tingling, numbness, weakness.  PSYCHIATRY: No anxiety or depression.   ROS  DRUG ALLERGIES:   Allergies  Allergen Reactions  . Prilosec [Omeprazole] Diarrhea    VITALS:  Blood pressure (!) 115/55, pulse 80, temperature (!) 97.5 F (36.4 C), temperature source Oral, resp. rate 20, height 5\' 11"  (1.803 m), weight 111.6 kg, SpO2 (!) 88 %.  PHYSICAL EXAMINATION:  GENERAL:  68 y.o.-year-old patient lying in the bed with no acute distress.  EYES: Pupils equal, round, reactive to light and accommodation. No scleral icterus. Extraocular muscles intact.  HEENT: Head atraumatic, normocephalic. Oropharynx and nasopharynx clear.  NECK:  Supple, no jugular venous distention. No thyroid enlargement, no tenderness.  LUNGS: Normal breath sounds bilaterally, b/l wheezing, have some crepitation. No use of accessory muscles of respiration.  CARDIOVASCULAR: S1, S2 normal. No murmurs, rubs, or gallops.  ABDOMEN: Soft, nontender, nondistended. Bowel sounds present. No  organomegaly or mass.  EXTREMITIES: No pedal edema, cyanosis, or clubbing.  NEUROLOGIC: Cranial nerves II through XII are intact. Muscle strength 4/5 in all extremities. Sensation intact. Gait not checked.  PSYCHIATRIC: The patient is alert and oriented x 3.  SKIN: No obvious rash, lesion, or ulcer.   Physical Exam LABORATORY PANEL:   CBC Recent Labs  Lab 2019-01-06 0430  WBC 69.0*  HGB 7.9*  HCT 24.4*  PLT 13*   ------------------------------------------------------------------------------------------------------------------  Chemistries  Recent Labs  Lab 01/02/2019 1308  2019-01-06 0430  NA 135   < > 131*  K 3.1*   < > 5.1  CL 92*   < > 97*  CO2 33*   < > 21*  GLUCOSE 79   < > 317*  BUN 33*   < > 89*  CREATININE 1.05   < > 1.66*  CALCIUM 8.6*   < > 8.4*  AST 41  --   --   ALT 34  --   --   ALKPHOS 80  --   --   BILITOT 1.6*  --   --    < > = values in this interval not displayed.   ------------------------------------------------------------------------------------------------------------------  Cardiac Enzymes No results for input(s): TROPONINI in the last 168 hours. ------------------------------------------------------------------------------------------------------------------  RADIOLOGY:  Dg Chest 2 View  Result Date: 01/03/2019 CLINICAL DATA:  68 year old male with a history of respiratory failure EXAM: CHEST - 2 VIEW COMPARISON:  11/23/2018, 01/05/2019, CT 12/20/2018 FINDINGS: Cardiomediastinal silhouette unchanged in size and contour. Surgical changes of median sternotomy. Redemonstration of diffuse reticulonodular pattern of opacity with no pleural effusion, interlobular septal thickening, pneumothorax. Patchy opacities in the bilateral lung bases similar to prior plain film and CT. No  displaced fracture.  Degenerative changes of the spine IMPRESSION: Similar appearance of the chest x-ray with chronic reticulonodular opacities and minimal airspace opacities  persisting in the lower lung bases. No new/worsening airspace disease Electronically Signed   By: Corrie Mckusick D.O.   On: 01/03/2019 08:15    ASSESSMENT AND PLAN:  68 year old male with past medical history of COPD, interstitial lung disease, diabetes, diabetic neuropathy, hypertension, recent admission for sepsis/respiratory failure with pneumonia who presents to the hospital due to worsening shortness of breath and hypoxemia.  *Acute on chronic respiratory failure with hypoxia secondary to pneumonia with underlying COPD/interstitial lung disease exacerbation Noted continued worsening, requiring 2 L via nasal cannula currently, given poor prognosis-patient elected to make himself comfort care with plans for home with hospice on tomorrow in the care of family Start comfort care order set with expectant management per patient wishes  *Acute COPD/interstitial lung disease exacerbation secondary to pneumonia Plan of care as stated above, will taper medications over the next 12 to 24 hours  *Acute pneumonia Treated on our pneumonia protocol, empiric cefepime Plan of care as stated above, will taper medications over the next 12 to 24 hours Patient's COVID-19 test was negative.  *Acute on chronic Leukocytosis/thrombocytopenia Largely due to [place] of care as stated above  Oncology did see patient while in house  *Diabetes type 2 with neuropathy Plan of care as stated above  *Essential hypertension Plan of care as stated above  In discussion with the patient today with nurse Stanton Kidney, plans for comfort care/DNR/DNI with plans for home hospice on tomorrow, case management/social work to assist with disposition planning, palliative care input greatly appreciated  All the records are reviewed and case discussed with Care Management/Social Workerr. Management plans discussed with the patient, family and they are in agreement.  CODE STATUS: DNR/DNI/comfort care  TOTAL TIME TAKING CARE OF  THIS PATIENT: 35 minutes.    POSSIBLE D/C IN 1 DAYS, DEPENDING ON CLINICAL CONDITION.   Avel Peace Edelyn Heidel M.D on 01/28/2019   Between 7am to 6pm - Pager - [phone]  After 6pm go to [email or web address] - password [email or web address] [facility]  Office  [phone]  CC: Primary care physician; Owens Loffler, MD  Note: This dictation was prepared with Dragon dictation along with smaller phrase technology. Any transcriptional errors that result from this process are unintentional.

## 2019-01-09 NOTE — Consult Note (Signed)
Pharmacy Antibiotic Note  Tony Watson is a 68 y.o. male admitted on 01/02/2019 with pneumonia. Patient has PMH significant for COPD (on 5 L oxygen at home), CML, and chronic respiratory failure. Patient was recently discharged for Pneumonia (treated with cefepime + vancomycin). Was also treated in 11/2018 with serratia and s. Aureus in respiratory culture. Pharmacy has been consulted for Vancomycin + Cefepime dosing.  Vanc d/c 5/26  Plan: Day 5- Will adjust Cefepime to 2 g q12h for Crcl 54.8 ml/min    Height: 5\' 11"  (180.3 cm) Weight: 246 lb (111.6 kg) IBW/kg (Calculated) : 75.3  Temp (24hrs), Avg:97.4 F (36.3 C), Min:97.3 F (36.3 C), Max:97.5 F (36.4 C)  Recent Labs  Lab 12/28/2018 1307  12/22/2018 1308 12/26/2018 1544 01/01/19 0515 01/01/19 1947 01/02/19 0043 01/03/19 0559 01-17-2019 0430  WBC  --    < > 60.6*  --  56.7* 73.5* 66.3* 59.6* 69.0*  CREATININE  --   --  1.05  --  1.17  --   --  1.27* 1.66*  LATICACIDVEN 2.0*  --   --  2.0*  --   --   --   --   --    < > = values in this interval not displayed.    Estimated Creatinine Clearance: 54.8 mL/min (A) (by C-G formula based on SCr of 1.66 mg/dL (H)).    Allergies  Allergen Reactions  . Prilosec [Omeprazole] Diarrhea    Antimicrobials this admission: 5/23 Vancomycin >> 5/26 5/23 Cefepime >>   Dose adjustments this admission: N/A  Microbiology results: 5/23 BCx: pending  5/23 MRSA PCR: neg 5/23 COVID-19 (-)  Thank you for allowing pharmacy to be a part of this patient's care.   Chinita Greenland PharmD Clinical Pharmacist 01/17/2019

## 2019-01-09 NOTE — Progress Notes (Signed)
Family Meeting Note  Advance Directive:yes  Today a meeting took place with the Patient.  Patient is able to participate  The following clinical team members were present during this meeting:MD  The following were discussed:Patient's diagnosis:68 y.o. male with a known history of CML, COPD, interstitial lung disease, chronic respiratory failure, diabetes, hypertension, spinal stenosis, GERD, diabetic neuropathy who presents to the hospital due to worsening shortness of breath and hypoxemia.  Patient was recently hospitalized and discharged about 10 days ago and treated for similar issues including acute on chronic respiratory failure with hypoxia secondary to pneumonia and COPD exacerbation and discharged on some oral antibiotics, now returns back due to worsening shortness of breath.  Patient says he was feeling better at home until about 4 to 5 days ago he developed worsening shortness of breath even at rest and worse with minimal exertion, his O2 requirements at home has gone up from 5 L to 8 L.  He admits to a cough which is productive with yellow-green sputum but denies any fevers, chills, chest pains, nausea or vomiting.  He presents to the emergency room was noted to be acutely hypoxemic with O2 sats in the mid to low 60s on room air and even on 5 L patient was significantly hypoxemic.  Patient CT chest shows pneumonia and hospitalist services were contacted for admission, worsening respiratory status while in house, worsening O2 requirement, patient elected to make himself comfort care going forward on 23-Jan-2019 and discussion with the patient and his nurse Stanton Kidney, Patient's progosis: Unable to determine and Goals for treatment: DNR  Additional follow-up to be provided: prn  Time spent during discussion:20 minutes  Gorden Harms, MD

## 2019-01-09 DEATH — deceased

## 2019-01-13 ENCOUNTER — Telehealth: Payer: Medicare HMO | Admitting: Family

## 2019-02-21 ENCOUNTER — Ambulatory Visit: Payer: Medicare HMO | Admitting: Oncology

## 2019-02-21 ENCOUNTER — Other Ambulatory Visit: Payer: Medicare HMO

## 2019-06-12 IMAGING — CT CT ANGIOGRAPHY CHEST
2 of 6 series · 18 of 36 positions shown · IV contrast (APPLIED)
Comparison: Radiographs today.  CT 11/23/2018 and 05/13/2017.

CLINICAL DATA: Worsening cough and shortness of breath. Recent
hospitalization for pneumonia.

EXAM:
CT ANGIOGRAPHY CHEST WITH CONTRAST
TECHNIQUE: Multidetector CT imaging of the chest was performed using the
standard protocol during bolus administration of intravenous
contrast. Multiplanar CT image reconstructions and MIPs were
obtained to evaluate the vascular anatomy.
CONTRAST:  75mL OMNIPAQUE IOHEXOL 350 MG/ML SOLN

[Series 5: thins · axial · 0.74mm/px · z∈[-979,-673]mm · 17 of 341 slices shown]
[im 18/341  lung]
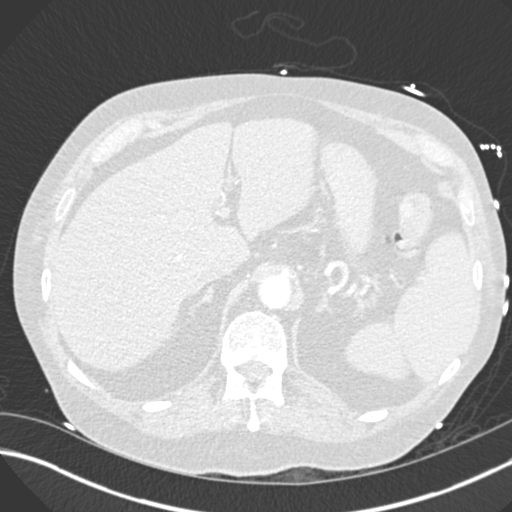
[im 35/341  mediastinal]
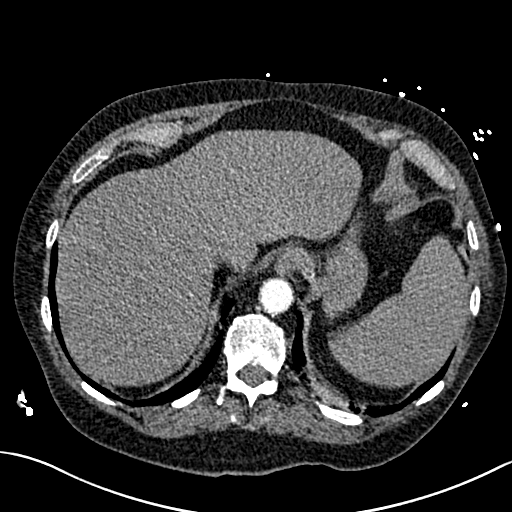
[im 52/341  lung]
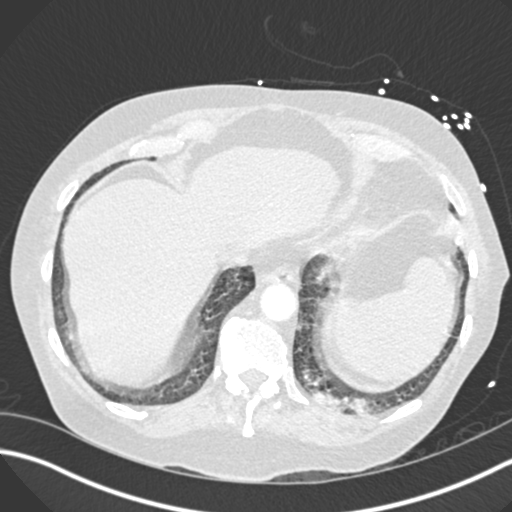
[im 69/341  mediastinal]
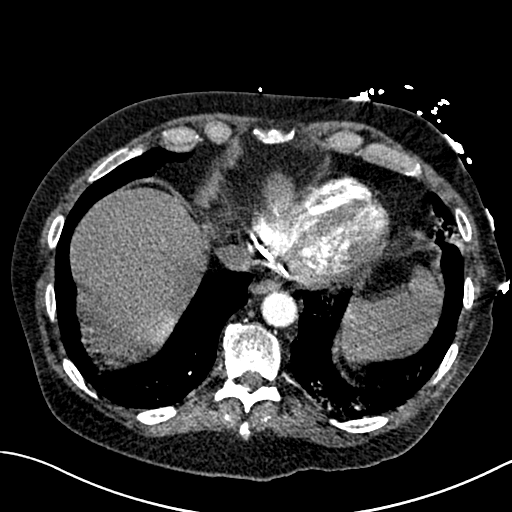
[im 103/341  lung]
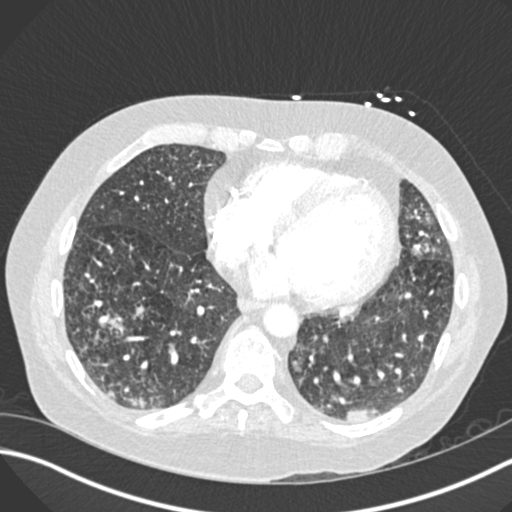
[im 120/341  mediastinal]
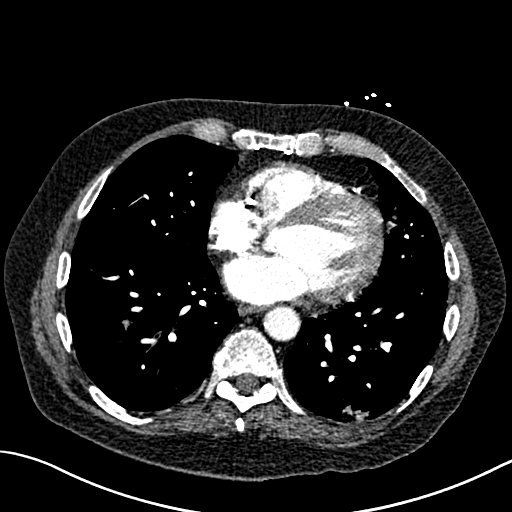
[im 137/341  lung]
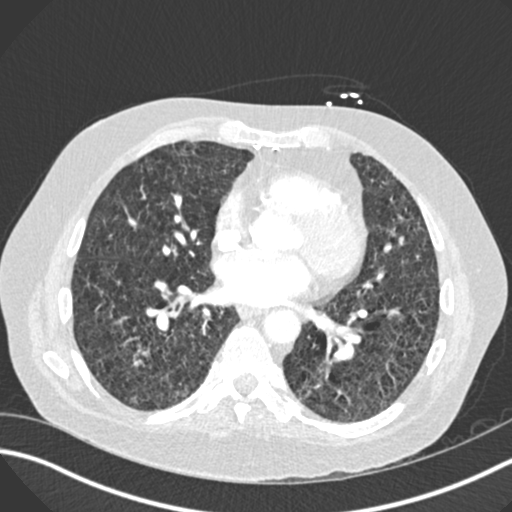
[im 154/341  mediastinal]
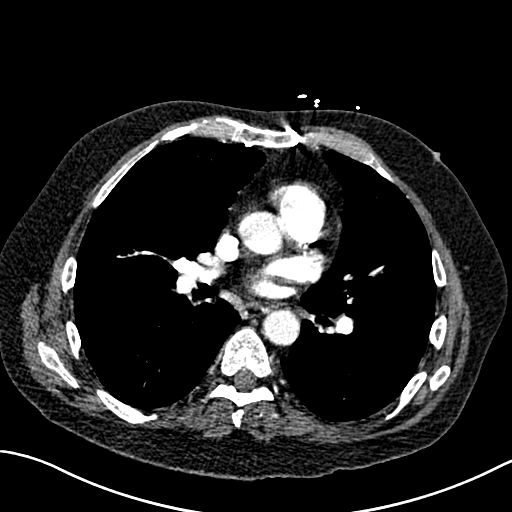
[im 171/341  lung]
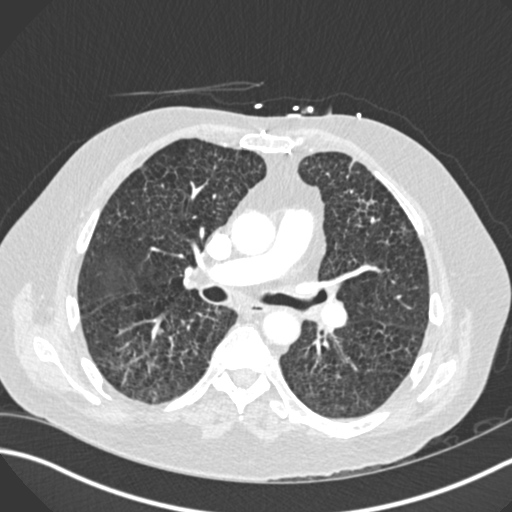
[im 188/341  mediastinal]
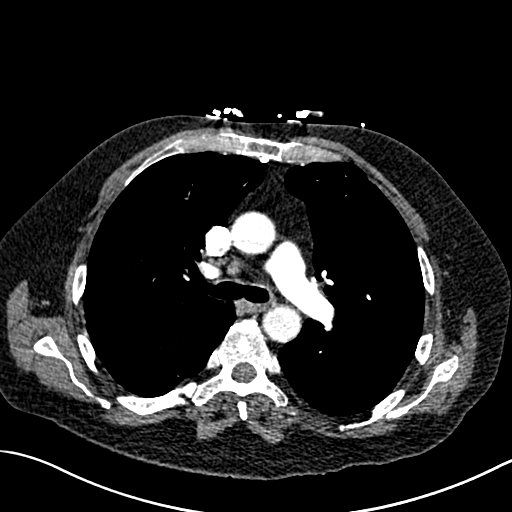
[im 205/341  lung]
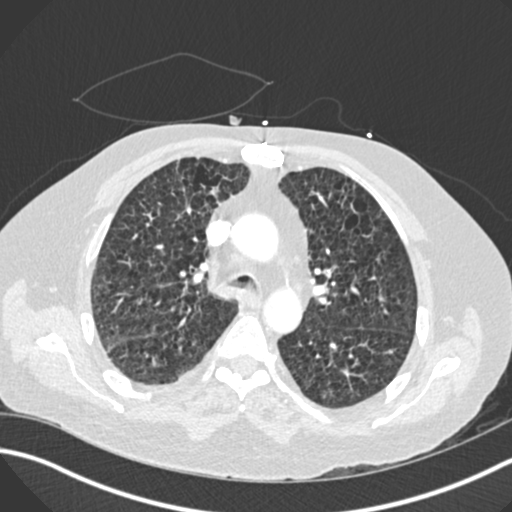
[im 222/341  mediastinal]
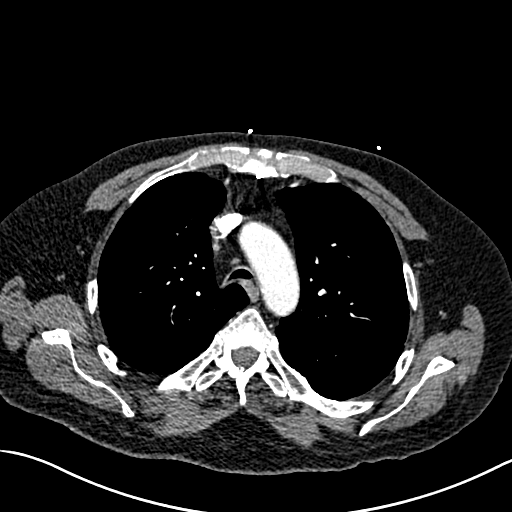
[im 239/341  lung]
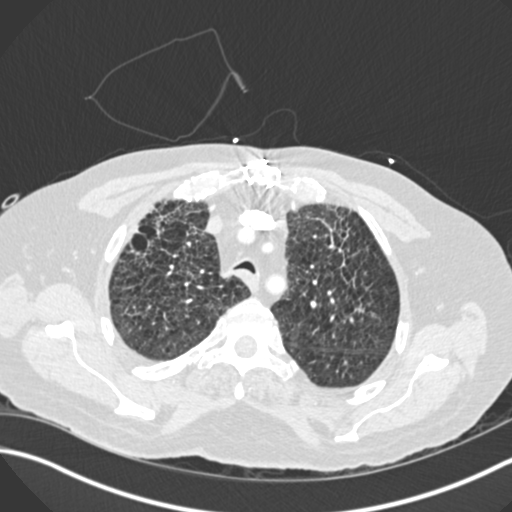
[im 273/341  mediastinal]
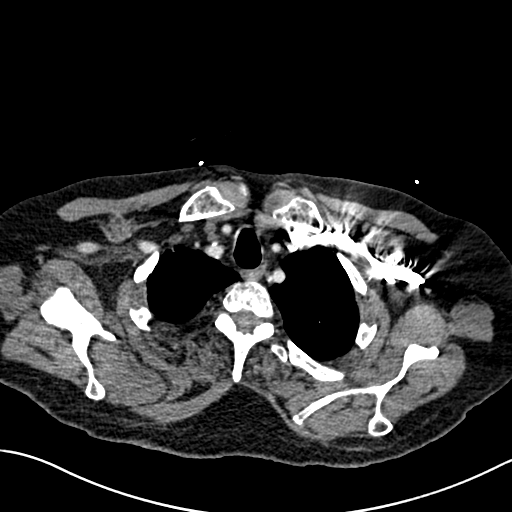
[im 290/341  lung]
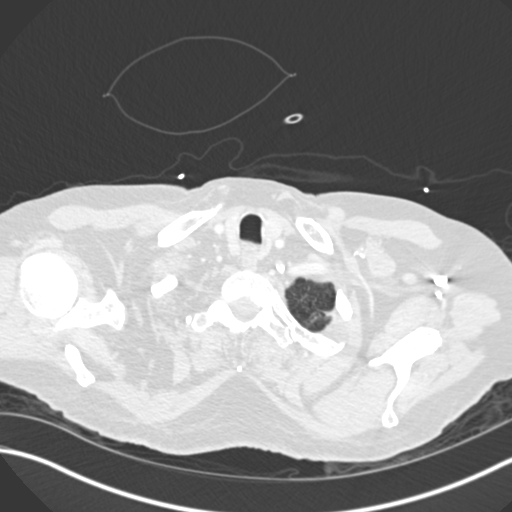
[im 307/341  mediastinal]
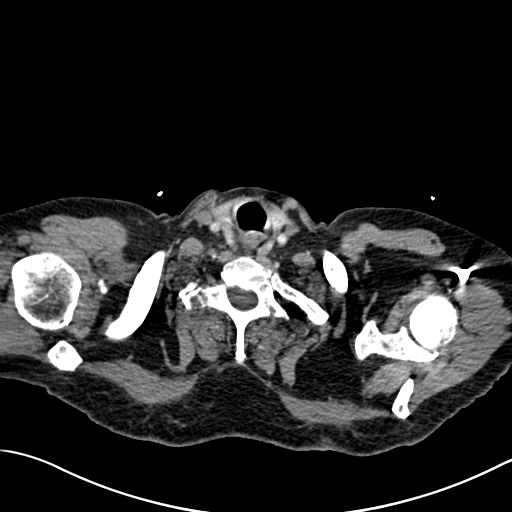
[im 324/341  lung]
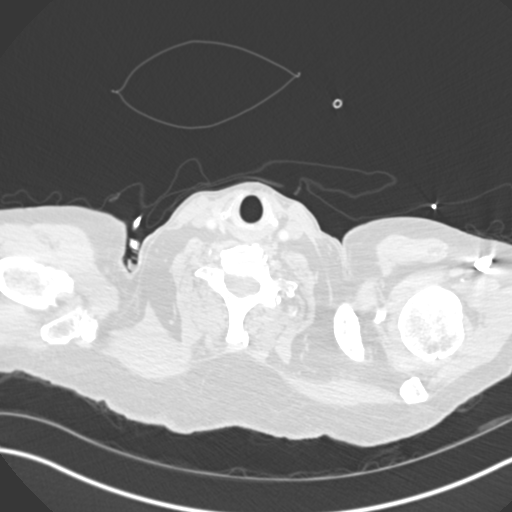

[Series 7: coronal mpr · coronal · 0.68mm/px · 1 of 94 slices shown]
[im 47/94  mediastinal]
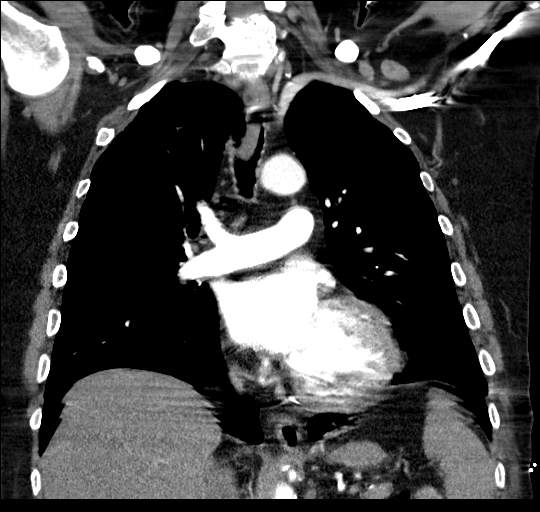

[18 of 36 positions shown; findings below may reference images not displayed]

FINDINGS: Cardiovascular: The pulmonary arteries are well opacified with
contrast to the level of the subsegmental branches. There is no
evidence of acute pulmonary embolism. Extensive coronary artery
atherosclerosis again noted with lesser involvement the aorta and
great vessels. No acute vascular findings. There are calcifications
the aortic valve. The heart size is normal. There is no pericardial
effusion.

Mediastinum/Nodes: There are no enlarged mediastinal, hilar or
axillary lymph nodes.Stable small mediastinal lymph nodes. The
thyroid gland, trachea and esophagus appear unchanged.

Lungs/Pleura: There is no pleural effusion or pneumothorax. Severe
chronic interstitial lung disease is again noted, improved from the
CT of 05/13/2017, at which time findings were felt to reflect
pulmonary Langerhans cell histiocytosis. There are moderate
emphysematous changes. The left lower lobe consolidation seen on the
most recent study has improved, although there are new patchy
ground-glass and airspace opacities throughout both lower lobes and
the lingula. These demonstrate a nonspecific distribution.

Upper abdomen: The visualized upper abdomen appears stable without
suspicious findings. Borderline hepatic steatosis.

Musculoskeletal/Chest wall: There is no chest wall mass or
suspicious osseous finding.

Review of the MIP images confirms the above findings.
IMPRESSION: 1. No evidence of acute pulmonary embolism.
2. The left lower lobe consolidation seen on the most recent study
has improved. However, there are new patchy ground-glass and
airspace opacities in both lower lobes and the lingula. These have a
nonspecific distribution and could reflect bacterial or viral
infection. [id number] testing today was negative.
3. Underlying chronic interstitial lung disease, likely due to a
combination emphysema and other smoking related lung disease such as
Langerhans cell histiocytosis as correlated with prior studies.
4. Coronary and Aortic Atherosclerosis ([id number]).
